# Patient Record
Sex: Female | Born: 1955
Health system: Southern US, Community
[De-identification: ages and names within clinical notes are randomized; demographics above are authoritative.]

## PROBLEM LIST (undated history)

## (undated) DIAGNOSIS — N02B9 Other recurrent and persistent immunoglobulin A nephropathy: Secondary | ICD-10-CM

## (undated) DIAGNOSIS — B029 Zoster without complications: Secondary | ICD-10-CM

## (undated) DIAGNOSIS — T8859XA Other complications of anesthesia, initial encounter: Secondary | ICD-10-CM

## (undated) DIAGNOSIS — R011 Cardiac murmur, unspecified: Secondary | ICD-10-CM

## (undated) DIAGNOSIS — Z8719 Personal history of other diseases of the digestive system: Secondary | ICD-10-CM

## (undated) DIAGNOSIS — T8612 Kidney transplant failure: Secondary | ICD-10-CM

## (undated) DIAGNOSIS — J189 Pneumonia, unspecified organism: Secondary | ICD-10-CM

## (undated) DIAGNOSIS — Z9289 Personal history of other medical treatment: Secondary | ICD-10-CM

## (undated) DIAGNOSIS — D899 Disorder involving the immune mechanism, unspecified: Secondary | ICD-10-CM

## (undated) DIAGNOSIS — Z87898 Personal history of other specified conditions: Secondary | ICD-10-CM

## (undated) DIAGNOSIS — K5790 Diverticulosis of intestine, part unspecified, without perforation or abscess without bleeding: Secondary | ICD-10-CM

## (undated) DIAGNOSIS — T8611 Kidney transplant rejection: Secondary | ICD-10-CM

## (undated) DIAGNOSIS — U071 COVID-19: Secondary | ICD-10-CM

## (undated) DIAGNOSIS — I10 Essential (primary) hypertension: Secondary | ICD-10-CM

## (undated) DIAGNOSIS — Z8709 Personal history of other diseases of the respiratory system: Secondary | ICD-10-CM

## (undated) DIAGNOSIS — I639 Cerebral infarction, unspecified: Secondary | ICD-10-CM

## (undated) DIAGNOSIS — I491 Atrial premature depolarization: Secondary | ICD-10-CM

## (undated) DIAGNOSIS — I440 Atrioventricular block, first degree: Secondary | ICD-10-CM

## (undated) DIAGNOSIS — T4145XA Adverse effect of unspecified anesthetic, initial encounter: Secondary | ICD-10-CM

## (undated) DIAGNOSIS — I493 Ventricular premature depolarization: Secondary | ICD-10-CM

## (undated) DIAGNOSIS — K219 Gastro-esophageal reflux disease without esophagitis: Secondary | ICD-10-CM

## (undated) DIAGNOSIS — E039 Hypothyroidism, unspecified: Secondary | ICD-10-CM

## (undated) DIAGNOSIS — M1990 Unspecified osteoarthritis, unspecified site: Secondary | ICD-10-CM

## (undated) DIAGNOSIS — Z992 Dependence on renal dialysis: Secondary | ICD-10-CM

## (undated) DIAGNOSIS — Z940 Kidney transplant status: Secondary | ICD-10-CM

## (undated) DIAGNOSIS — G4730 Sleep apnea, unspecified: Secondary | ICD-10-CM

## (undated) DIAGNOSIS — J45909 Unspecified asthma, uncomplicated: Secondary | ICD-10-CM

## (undated) DIAGNOSIS — N028 Recurrent and persistent hematuria with other morphologic changes: Secondary | ICD-10-CM

## (undated) DIAGNOSIS — D649 Anemia, unspecified: Secondary | ICD-10-CM

## (undated) DIAGNOSIS — R0600 Dyspnea, unspecified: Secondary | ICD-10-CM

## (undated) HISTORY — DX: Diverticulosis of intestine, part unspecified, without perforation or abscess without bleeding: K57.90

## (undated) HISTORY — DX: Disorder involving the immune mechanism, unspecified: D89.9

## (undated) HISTORY — PX: COLONOSCOPY: SHX174

## (undated) HISTORY — DX: Atrioventricular block, first degree: I44.0

## (undated) HISTORY — PX: EYE SURGERY: SHX253

## (undated) HISTORY — DX: Dependence on renal dialysis: Z99.2

## (undated) HISTORY — PX: ESOPHAGOGASTRODUODENOSCOPY: SHX1529

## (undated) HISTORY — DX: Atrial premature depolarization: I49.1

## (undated) HISTORY — DX: Unspecified asthma, uncomplicated: J45.909

## (undated) HISTORY — DX: Zoster without complications: B02.9

## (undated) HISTORY — DX: Ventricular premature depolarization: I49.3

---

## 2001-08-17 ENCOUNTER — Other Ambulatory Visit: Admission: RE | Admit: 2001-08-17 | Discharge: 2001-08-17 | Payer: Self-pay | Admitting: Family Medicine

## 2002-06-21 ENCOUNTER — Encounter: Admission: RE | Admit: 2002-06-21 | Discharge: 2002-09-19 | Payer: Self-pay | Admitting: Nephrology

## 2002-07-09 ENCOUNTER — Encounter (HOSPITAL_COMMUNITY): Admission: RE | Admit: 2002-07-09 | Discharge: 2002-10-07 | Payer: Self-pay | Admitting: Nephrology

## 2002-10-13 ENCOUNTER — Encounter (HOSPITAL_COMMUNITY): Admission: RE | Admit: 2002-10-13 | Discharge: 2003-01-11 | Payer: Self-pay | Admitting: Nephrology

## 2003-01-18 ENCOUNTER — Encounter (HOSPITAL_COMMUNITY): Admission: RE | Admit: 2003-01-18 | Discharge: 2003-04-18 | Payer: Self-pay | Admitting: Nephrology

## 2003-02-11 ENCOUNTER — Ambulatory Visit (HOSPITAL_COMMUNITY): Admission: RE | Admit: 2003-02-11 | Discharge: 2003-02-11 | Payer: Self-pay | Admitting: Family Medicine

## 2003-02-11 ENCOUNTER — Encounter: Payer: Self-pay | Admitting: Family Medicine

## 2003-04-20 ENCOUNTER — Encounter (HOSPITAL_COMMUNITY): Admission: RE | Admit: 2003-04-20 | Discharge: 2003-07-19 | Payer: Self-pay | Admitting: Nephrology

## 2003-07-26 ENCOUNTER — Encounter (HOSPITAL_COMMUNITY): Admission: RE | Admit: 2003-07-26 | Discharge: 2003-10-24 | Payer: Self-pay | Admitting: Nephrology

## 2003-10-26 ENCOUNTER — Encounter (HOSPITAL_COMMUNITY): Admission: RE | Admit: 2003-10-26 | Discharge: 2004-01-24 | Payer: Self-pay | Admitting: Nephrology

## 2004-02-02 ENCOUNTER — Other Ambulatory Visit: Admission: RE | Admit: 2004-02-02 | Discharge: 2004-02-02 | Payer: Self-pay | Admitting: Family Medicine

## 2004-02-09 ENCOUNTER — Encounter (HOSPITAL_COMMUNITY): Admission: RE | Admit: 2004-02-09 | Discharge: 2004-05-09 | Payer: Self-pay | Admitting: Nephrology

## 2004-05-25 ENCOUNTER — Encounter (HOSPITAL_COMMUNITY): Admission: RE | Admit: 2004-05-25 | Discharge: 2004-08-23 | Payer: Self-pay | Admitting: Nephrology

## 2004-07-02 ENCOUNTER — Encounter: Admission: RE | Admit: 2004-07-02 | Discharge: 2004-07-02 | Payer: Self-pay | Admitting: Internal Medicine

## 2004-09-13 ENCOUNTER — Encounter (HOSPITAL_COMMUNITY): Admission: RE | Admit: 2004-09-13 | Discharge: 2004-12-12 | Payer: Self-pay | Admitting: Nephrology

## 2004-12-27 ENCOUNTER — Encounter (HOSPITAL_COMMUNITY): Admission: RE | Admit: 2004-12-27 | Discharge: 2005-03-27 | Payer: Self-pay | Admitting: Nephrology

## 2005-04-10 ENCOUNTER — Encounter (HOSPITAL_COMMUNITY): Admission: RE | Admit: 2005-04-10 | Discharge: 2005-07-09 | Payer: Self-pay | Admitting: Nephrology

## 2005-06-22 ENCOUNTER — Ambulatory Visit (HOSPITAL_BASED_OUTPATIENT_CLINIC_OR_DEPARTMENT_OTHER): Admission: RE | Admit: 2005-06-22 | Discharge: 2005-06-22 | Payer: Self-pay | Admitting: Otolaryngology

## 2005-06-30 ENCOUNTER — Ambulatory Visit: Payer: Self-pay | Admitting: Internal Medicine

## 2005-08-02 ENCOUNTER — Encounter (HOSPITAL_COMMUNITY): Admission: RE | Admit: 2005-08-02 | Discharge: 2005-10-31 | Payer: Self-pay | Admitting: Otolaryngology

## 2005-12-05 ENCOUNTER — Encounter (HOSPITAL_COMMUNITY): Admission: RE | Admit: 2005-12-05 | Discharge: 2006-03-05 | Payer: Self-pay | Admitting: Nephrology

## 2006-03-27 ENCOUNTER — Encounter (HOSPITAL_COMMUNITY): Admission: RE | Admit: 2006-03-27 | Discharge: 2006-06-06 | Payer: Self-pay | Admitting: Nephrology

## 2006-07-09 ENCOUNTER — Encounter (HOSPITAL_COMMUNITY): Admission: RE | Admit: 2006-07-09 | Discharge: 2006-10-07 | Payer: Self-pay | Admitting: Nephrology

## 2006-10-29 ENCOUNTER — Encounter (HOSPITAL_COMMUNITY): Admission: RE | Admit: 2006-10-29 | Discharge: 2007-01-27 | Payer: Self-pay | Admitting: Nephrology

## 2007-02-18 ENCOUNTER — Encounter (HOSPITAL_COMMUNITY): Admission: RE | Admit: 2007-02-18 | Discharge: 2007-05-19 | Payer: Self-pay | Admitting: Nephrology

## 2007-06-03 ENCOUNTER — Encounter (HOSPITAL_COMMUNITY): Admission: RE | Admit: 2007-06-03 | Discharge: 2007-09-01 | Payer: Self-pay | Admitting: Nephrology

## 2007-08-17 ENCOUNTER — Ambulatory Visit: Payer: Self-pay | Admitting: Internal Medicine

## 2007-09-16 ENCOUNTER — Encounter (HOSPITAL_COMMUNITY): Admission: RE | Admit: 2007-09-16 | Discharge: 2007-12-15 | Payer: Self-pay | Admitting: Nephrology

## 2007-10-12 DIAGNOSIS — J039 Acute tonsillitis, unspecified: Secondary | ICD-10-CM | POA: Insufficient documentation

## 2007-10-12 DIAGNOSIS — J309 Allergic rhinitis, unspecified: Secondary | ICD-10-CM | POA: Insufficient documentation

## 2007-10-12 DIAGNOSIS — E039 Hypothyroidism, unspecified: Secondary | ICD-10-CM | POA: Insufficient documentation

## 2007-10-12 DIAGNOSIS — I10 Essential (primary) hypertension: Secondary | ICD-10-CM | POA: Insufficient documentation

## 2007-10-12 DIAGNOSIS — D649 Anemia, unspecified: Secondary | ICD-10-CM | POA: Insufficient documentation

## 2007-10-12 DIAGNOSIS — I499 Cardiac arrhythmia, unspecified: Secondary | ICD-10-CM | POA: Insufficient documentation

## 2007-10-12 DIAGNOSIS — J45909 Unspecified asthma, uncomplicated: Secondary | ICD-10-CM | POA: Insufficient documentation

## 2007-10-12 DIAGNOSIS — Z992 Dependence on renal dialysis: Secondary | ICD-10-CM

## 2007-10-12 DIAGNOSIS — N186 End stage renal disease: Secondary | ICD-10-CM | POA: Insufficient documentation

## 2007-11-09 ENCOUNTER — Ambulatory Visit: Payer: Self-pay | Admitting: Internal Medicine

## 2007-11-09 DIAGNOSIS — J45991 Cough variant asthma: Secondary | ICD-10-CM | POA: Insufficient documentation

## 2007-11-12 ENCOUNTER — Ambulatory Visit (HOSPITAL_COMMUNITY): Admission: RE | Admit: 2007-11-12 | Discharge: 2007-11-12 | Payer: Self-pay | Admitting: Internal Medicine

## 2007-12-07 ENCOUNTER — Ambulatory Visit: Payer: Self-pay | Admitting: Internal Medicine

## 2007-12-07 DIAGNOSIS — K219 Gastro-esophageal reflux disease without esophagitis: Secondary | ICD-10-CM | POA: Insufficient documentation

## 2007-12-24 ENCOUNTER — Ambulatory Visit: Payer: Self-pay | Admitting: Gastroenterology

## 2007-12-30 ENCOUNTER — Encounter (HOSPITAL_COMMUNITY): Admission: RE | Admit: 2007-12-30 | Discharge: 2008-03-29 | Payer: Self-pay | Admitting: Nephrology

## 2008-01-11 ENCOUNTER — Ambulatory Visit: Payer: Self-pay | Admitting: Gastroenterology

## 2008-01-11 ENCOUNTER — Encounter: Payer: Self-pay | Admitting: Gastroenterology

## 2008-04-13 ENCOUNTER — Encounter (HOSPITAL_COMMUNITY): Admission: RE | Admit: 2008-04-13 | Discharge: 2008-07-12 | Payer: Self-pay | Admitting: Nephrology

## 2008-05-06 ENCOUNTER — Encounter: Payer: Self-pay | Admitting: Internal Medicine

## 2008-07-27 ENCOUNTER — Encounter (HOSPITAL_COMMUNITY): Admission: RE | Admit: 2008-07-27 | Discharge: 2008-09-22 | Payer: Self-pay | Admitting: Nephrology

## 2008-08-25 ENCOUNTER — Ambulatory Visit (HOSPITAL_COMMUNITY): Admission: RE | Admit: 2008-08-25 | Discharge: 2008-08-25 | Payer: Self-pay | Admitting: General Surgery

## 2008-08-25 HISTORY — PX: CAPD INSERTION: SHX5233

## 2008-10-14 ENCOUNTER — Inpatient Hospital Stay (HOSPITAL_COMMUNITY): Admission: EM | Admit: 2008-10-14 | Discharge: 2008-10-15 | Payer: Self-pay | Admitting: Emergency Medicine

## 2008-10-14 ENCOUNTER — Ambulatory Visit: Payer: Self-pay | Admitting: Cardiology

## 2008-10-15 ENCOUNTER — Encounter: Payer: Self-pay | Admitting: Cardiology

## 2008-11-10 ENCOUNTER — Ambulatory Visit: Payer: Self-pay | Admitting: Cardiology

## 2008-11-17 ENCOUNTER — Encounter: Payer: Self-pay | Admitting: Cardiology

## 2008-11-17 ENCOUNTER — Ambulatory Visit: Payer: Self-pay

## 2008-11-21 ENCOUNTER — Encounter: Admission: RE | Admit: 2008-11-21 | Discharge: 2008-11-21 | Payer: Self-pay | Admitting: Nephrology

## 2009-03-30 ENCOUNTER — Other Ambulatory Visit: Admission: RE | Admit: 2009-03-30 | Discharge: 2009-03-30 | Payer: Self-pay | Admitting: Family Medicine

## 2009-07-08 ENCOUNTER — Inpatient Hospital Stay (HOSPITAL_COMMUNITY): Admission: EM | Admit: 2009-07-08 | Discharge: 2009-07-10 | Payer: Self-pay | Admitting: Emergency Medicine

## 2009-07-08 ENCOUNTER — Ambulatory Visit: Payer: Self-pay | Admitting: Internal Medicine

## 2009-07-10 ENCOUNTER — Encounter (INDEPENDENT_AMBULATORY_CARE_PROVIDER_SITE_OTHER): Payer: Self-pay | Admitting: Internal Medicine

## 2009-10-25 ENCOUNTER — Ambulatory Visit (HOSPITAL_COMMUNITY): Admission: RE | Admit: 2009-10-25 | Discharge: 2009-10-25 | Payer: Self-pay | Admitting: General Surgery

## 2009-10-25 HISTORY — PX: UMBILICAL HERNIA REPAIR: SHX196

## 2009-12-09 ENCOUNTER — Encounter: Admission: RE | Admit: 2009-12-09 | Discharge: 2009-12-09 | Payer: Self-pay | Admitting: Nephrology

## 2010-01-21 HISTORY — PX: KIDNEY TRANSPLANT: SHX239

## 2010-03-22 ENCOUNTER — Ambulatory Visit (HOSPITAL_COMMUNITY): Admission: RE | Admit: 2010-03-22 | Discharge: 2010-03-22 | Payer: Self-pay | Admitting: General Surgery

## 2010-03-22 HISTORY — PX: CAPD REMOVAL: SHX5234

## 2010-09-23 DIAGNOSIS — T8611 Kidney transplant rejection: Secondary | ICD-10-CM

## 2010-09-23 HISTORY — DX: Kidney transplant rejection: T86.11

## 2010-09-24 LAB — HM COLONOSCOPY: HM COLON: NORMAL

## 2010-12-09 LAB — DIFFERENTIAL
Basophils Absolute: 0 10*3/uL (ref 0.0–0.1)
Basophils Absolute: 0 10*3/uL (ref 0.0–0.1)
Basophils Relative: 0 % (ref 0–1)
Basophils Relative: 0 % (ref 0–1)
Eosinophils Absolute: 0.1 10*3/uL (ref 0.0–0.7)
Eosinophils Absolute: 0 10*3/uL (ref 0.0–0.7)
Eosinophils Relative: 3 % (ref 0–5)
Eosinophils Relative: 0 % (ref 0–5)
Lymphocytes Relative: 22 % (ref 12–46)
Lymphocytes Relative: 13 % (ref 12–46)
Lymphs Abs: 1.2 10*3/uL (ref 0.7–4.0)
Lymphs Abs: 1.2 10*3/uL (ref 0.7–4.0)
Monocytes Absolute: 0.4 10*3/uL (ref 0.1–1.0)
Monocytes Absolute: 0.4 10*3/uL (ref 0.1–1.0)
Monocytes Relative: 7 % (ref 3–12)
Monocytes Relative: 5 % (ref 3–12)
Neutro Abs: 3.6 10*3/uL (ref 1.7–7.7)
Neutro Abs: 7.3 10*3/uL (ref 1.7–7.7)
Neutrophils Relative %: 68 % (ref 43–77)
Neutrophils Relative %: 82 % — ABNORMAL HIGH (ref 43–77)

## 2010-12-09 LAB — CBC
HCT: 30.1 % — ABNORMAL LOW (ref 36.0–46.0)
HCT: 34.9 % — ABNORMAL LOW (ref 36.0–46.0)
Hemoglobin: 10.2 g/dL — ABNORMAL LOW (ref 12.0–15.0)
Hemoglobin: 11.5 g/dL — ABNORMAL LOW (ref 12.0–15.0)
MCH: 34.3 pg — ABNORMAL HIGH (ref 26.0–34.0)
MCHC: 33.9 g/dL (ref 30.0–36.0)
MCHC: 32.9 g/dL (ref 30.0–36.0)
MCV: 99.8 fL (ref 78.0–100.0)
MCV: 104.3 fL — ABNORMAL HIGH (ref 78.0–100.0)
Platelets: 195 10*3/uL (ref 150–400)
Platelets: 177 10*3/uL (ref 150–400)
RBC: 3.02 MIL/uL — ABNORMAL LOW (ref 3.87–5.11)
RBC: 3.35 MIL/uL — ABNORMAL LOW (ref 3.87–5.11)
RDW: 15.5 % (ref 11.5–15.5)
RDW: 15.4 % (ref 11.5–15.5)
WBC: 5.2 10*3/uL (ref 4.0–10.5)
WBC: 8.9 10*3/uL (ref 4.0–10.5)

## 2010-12-09 LAB — BASIC METABOLIC PANEL
BUN: 55 mg/dL — ABNORMAL HIGH (ref 6–23)
BUN: 20 mg/dL (ref 6–23)
CO2: 31 mEq/L (ref 19–32)
CO2: 27 mEq/L (ref 19–32)
Calcium: 10 mg/dL (ref 8.4–10.5)
Calcium: 10.3 mg/dL (ref 8.4–10.5)
Chloride: 95 mEq/L — ABNORMAL LOW (ref 96–112)
Chloride: 109 mEq/L (ref 96–112)
Creatinine, Ser: 12.82 mg/dL — ABNORMAL HIGH (ref 0.4–1.2)
Creatinine, Ser: 1.18 mg/dL (ref 0.4–1.2)
GFR calc Af Amer: 4 mL/min — ABNORMAL LOW (ref >60)
GFR calc Af Amer: 58 mL/min — ABNORMAL LOW (ref >60)
GFR calc non Af Amer: 3 mL/min — ABNORMAL LOW (ref >60)
GFR calc non Af Amer: 48 mL/min — ABNORMAL LOW (ref >60)
Glucose, Bld: 116 mg/dL — ABNORMAL HIGH (ref 70–99)
Glucose, Bld: 129 mg/dL — ABNORMAL HIGH (ref 70–99)
Potassium: 3.7 mEq/L (ref 3.5–5.1)
Potassium: 5 mEq/L (ref 3.5–5.1)
Sodium: 138 mEq/L (ref 135–145)
Sodium: 139 mEq/L (ref 135–145)

## 2010-12-09 LAB — SURGICAL PCR SCREEN
MRSA, PCR: NEGATIVE
Staphylococcus aureus: NEGATIVE

## 2010-12-12 LAB — POCT I-STAT 4, (NA,K, GLUC, HGB,HCT)
Glucose, Bld: 104 mg/dL — ABNORMAL HIGH (ref 70–99)
HCT: 26 % — ABNORMAL LOW (ref 36.0–46.0)
Hemoglobin: 8.8 g/dL — ABNORMAL LOW (ref 12.0–15.0)
Potassium: 3.4 mEq/L — ABNORMAL LOW (ref 3.5–5.1)
Sodium: 138 mEq/L (ref 135–145)

## 2010-12-27 LAB — COMPREHENSIVE METABOLIC PANEL
Alkaline Phosphatase: 94 U/L (ref 39–117)
BUN: 57 mg/dL — ABNORMAL HIGH (ref 6–23)
CO2: 27 mEq/L (ref 19–32)
GFR calc non Af Amer: 3 mL/min — ABNORMAL LOW (ref >60)
Glucose, Bld: 113 mg/dL — ABNORMAL HIGH (ref 70–99)
Potassium: 3.5 mEq/L (ref 3.5–5.1)
Total Protein: 6.7 g/dL (ref 6.0–8.3)

## 2010-12-27 LAB — CBC
HCT: 42.9 % (ref 36.0–46.0)
Hemoglobin: 12.8 g/dL (ref 12.0–15.0)
Hemoglobin: 14.6 g/dL (ref 12.0–15.0)
MCHC: 34.1 g/dL (ref 30.0–36.0)
MCHC: 34.7 g/dL (ref 30.0–36.0)
MCV: 98.2 fL (ref 78.0–100.0)
Platelets: 235 10*3/uL (ref 150–400)
RBC: 4.36 MIL/uL (ref 3.87–5.11)
RDW: 14 % (ref 11.5–15.5)
RDW: 14.1 % (ref 11.5–15.5)
WBC: 6.3 10*3/uL (ref 4.0–10.5)

## 2010-12-27 LAB — URINALYSIS, ROUTINE W REFLEX MICROSCOPIC
Glucose, UA: NEGATIVE mg/dL
Ketones, ur: NEGATIVE mg/dL
Protein, ur: 100 mg/dL — AB
Urobilinogen, UA: 0.2 mg/dL (ref 0.0–1.0)

## 2010-12-27 LAB — CARDIAC PANEL(CRET KIN+CKTOT+MB+TROPI)
CK, MB: 2.9 ng/mL (ref 0.3–4.0)
CK, MB: 3.1 ng/mL (ref 0.3–4.0)
Relative Index: 2.1 (ref 0.0–2.5)
Total CK: 135 U/L (ref 7–177)
Total CK: 162 U/L (ref 7–177)
Troponin I: 0.03 ng/mL (ref 0.00–0.06)

## 2010-12-27 LAB — HEMOGLOBIN A1C
Hgb A1c MFr Bld: 5.4 % (ref 4.6–6.1)
Mean Plasma Glucose: 108 mg/dL

## 2010-12-27 LAB — RENAL FUNCTION PANEL
Albumin: 2.7 g/dL — ABNORMAL LOW (ref 3.5–5.2)
Calcium: 9.9 mg/dL (ref 8.4–10.5)
GFR calc Af Amer: 4 mL/min — ABNORMAL LOW (ref >60)
GFR calc non Af Amer: 3 mL/min — ABNORMAL LOW (ref >60)
Phosphorus: 4.7 mg/dL — ABNORMAL HIGH (ref 2.3–4.6)
Potassium: 3.2 mEq/L — ABNORMAL LOW (ref 3.5–5.1)
Sodium: 132 mEq/L — ABNORMAL LOW (ref 135–145)

## 2010-12-27 LAB — URINE MICROSCOPIC-ADD ON

## 2010-12-27 LAB — DIFFERENTIAL
Basophils Absolute: 0 10*3/uL (ref 0.0–0.1)
Basophils Relative: 0 % (ref 0–1)
Monocytes Relative: 8 % (ref 3–12)
Neutro Abs: 4.4 10*3/uL (ref 1.7–7.7)
Neutrophils Relative %: 70 % (ref 43–77)

## 2010-12-27 LAB — PROTIME-INR
INR: 1.02 (ref 0.00–1.49)
Prothrombin Time: 13.3 seconds (ref 11.6–15.2)

## 2010-12-27 LAB — POCT I-STAT, CHEM 8
Calcium, Ion: 1.23 mmol/L (ref 1.12–1.32)
Creatinine, Ser: 11.8 mg/dL — ABNORMAL HIGH (ref 0.4–1.2)
Glucose, Bld: 109 mg/dL — ABNORMAL HIGH (ref 70–99)
HCT: 43 % (ref 36.0–46.0)
Hemoglobin: 14.6 g/dL (ref 12.0–15.0)
Potassium: 3 mEq/L — ABNORMAL LOW (ref 3.5–5.1)
TCO2: 29 mmol/L (ref 0–100)

## 2010-12-27 LAB — CK TOTAL AND CKMB (NOT AT ARMC)
CK, MB: 3.5 ng/mL (ref 0.3–4.0)
CK, MB: 3.7 ng/mL (ref 0.3–4.0)
Relative Index: 2.3 (ref 0.0–2.5)
Total CK: 155 U/L (ref 7–177)
Total CK: 185 U/L — ABNORMAL HIGH (ref 7–177)

## 2010-12-27 LAB — LIPID PANEL
Cholesterol: 143 mg/dL (ref 0–200)
HDL: 45 mg/dL (ref >39)
LDL Cholesterol: 93 mg/dL (ref 0–99)
Total CHOL/HDL Ratio: 3.2 RATIO
Triglycerides: 23 mg/dL (ref <150)
VLDL: 5 mg/dL (ref 0–40)

## 2010-12-27 LAB — HOMOCYSTEINE: Homocysteine: 16.9 umol/L — ABNORMAL HIGH (ref 4.0–15.4)

## 2010-12-27 LAB — APTT: aPTT: 26 seconds (ref 24–37)

## 2011-01-03 ENCOUNTER — Emergency Department (HOSPITAL_COMMUNITY)
Admission: EM | Admit: 2011-01-03 | Discharge: 2011-01-04 | Disposition: A | Discharge status: Nursing Home (Includes ICF) | Payer: BC Managed Care – PPO | Attending: Emergency Medicine | Admitting: Emergency Medicine

## 2011-01-03 DIAGNOSIS — R300 Dysuria: Secondary | ICD-10-CM | POA: Insufficient documentation

## 2011-01-03 DIAGNOSIS — N390 Urinary tract infection, site not specified: Secondary | ICD-10-CM | POA: Insufficient documentation

## 2011-01-03 DIAGNOSIS — I10 Essential (primary) hypertension: Secondary | ICD-10-CM | POA: Insufficient documentation

## 2011-01-03 DIAGNOSIS — E039 Hypothyroidism, unspecified: Secondary | ICD-10-CM | POA: Insufficient documentation

## 2011-01-03 DIAGNOSIS — Z940 Kidney transplant status: Secondary | ICD-10-CM | POA: Insufficient documentation

## 2011-01-03 DIAGNOSIS — N289 Disorder of kidney and ureter, unspecified: Secondary | ICD-10-CM | POA: Insufficient documentation

## 2011-01-03 DIAGNOSIS — K219 Gastro-esophageal reflux disease without esophagitis: Secondary | ICD-10-CM | POA: Insufficient documentation

## 2011-01-03 DIAGNOSIS — R3915 Urgency of urination: Secondary | ICD-10-CM | POA: Insufficient documentation

## 2011-01-03 DIAGNOSIS — R319 Hematuria, unspecified: Secondary | ICD-10-CM | POA: Insufficient documentation

## 2011-01-03 DIAGNOSIS — R350 Frequency of micturition: Secondary | ICD-10-CM | POA: Insufficient documentation

## 2011-01-04 LAB — URINALYSIS, ROUTINE W REFLEX MICROSCOPIC
Ketones, ur: 15 mg/dL — AB
Nitrite: POSITIVE — AB
Protein, ur: >300 mg/dL — AB
Urobilinogen, UA: 0.2 mg/dL (ref 0.0–1.0)

## 2011-01-04 LAB — CBC
MCH: 28.9 pg (ref 26.0–34.0)
MCHC: 32.4 g/dL (ref 30.0–36.0)
Platelets: 192 10*3/uL (ref 150–400)
RBC: 4.71 MIL/uL (ref 3.87–5.11)
RDW: 12.1 % (ref 11.5–15.5)

## 2011-01-04 LAB — BASIC METABOLIC PANEL
BUN: 41 mg/dL — ABNORMAL HIGH (ref 6–23)
Calcium: 10.2 mg/dL (ref 8.4–10.5)
Creatinine, Ser: 3.1 mg/dL — ABNORMAL HIGH (ref 0.4–1.2)
GFR calc Af Amer: 19 mL/min — ABNORMAL LOW (ref >60)
GFR calc non Af Amer: 16 mL/min — ABNORMAL LOW (ref >60)

## 2011-01-04 LAB — DIFFERENTIAL
Eosinophils Absolute: 0.1 10*3/uL (ref 0.0–0.7)
Eosinophils Relative: 1 % (ref 0–5)
Lymphocytes Relative: 15 % (ref 12–46)
Lymphs Abs: 1.4 10*3/uL (ref 0.7–4.0)
Monocytes Absolute: 1 10*3/uL (ref 0.1–1.0)
Monocytes Relative: 11 % (ref 3–12)

## 2011-01-04 LAB — URINE MICROSCOPIC-ADD ON

## 2011-01-05 LAB — URINE CULTURE

## 2011-01-07 LAB — CBC
HCT: 40.1 % (ref 36.0–46.0)
Hemoglobin: 12.6 g/dL (ref 12.0–15.0)
MCV: 85.5 fL (ref 78.0–100.0)
MCV: 87.2 fL (ref 78.0–100.0)
Platelets: 341 10*3/uL (ref 150–400)
Platelets: 366 10*3/uL (ref 150–400)
RBC: 4.03 MIL/uL (ref 3.87–5.11)
RDW: 15.9 % — ABNORMAL HIGH (ref 11.5–15.5)
WBC: 5.3 10*3/uL (ref 4.0–10.5)

## 2011-01-07 LAB — BASIC METABOLIC PANEL
BUN: 55 mg/dL — ABNORMAL HIGH (ref 6–23)
CO2: 22 mEq/L (ref 19–32)
Chloride: 94 mEq/L — ABNORMAL LOW (ref 96–112)
Chloride: 94 mEq/L — ABNORMAL LOW (ref 96–112)
GFR calc Af Amer: 2 mL/min — ABNORMAL LOW (ref >60)
Glucose, Bld: 92 mg/dL (ref 70–99)
Potassium: 3.3 mEq/L — ABNORMAL LOW (ref 3.5–5.1)
Potassium: 3.5 mEq/L (ref 3.5–5.1)
Sodium: 135 mEq/L (ref 135–145)
Sodium: 137 mEq/L (ref 135–145)

## 2011-01-07 LAB — POCT CARDIAC MARKERS
CKMB, poc: 2.6 ng/mL (ref 1.0–8.0)
Myoglobin, poc: >500 ng/mL (ref 12–200)
Troponin i, poc: <0.05 ng/mL (ref 0.00–0.09)

## 2011-01-07 LAB — DIFFERENTIAL
Basophils Absolute: 0 10*3/uL (ref 0.0–0.1)
Eosinophils Absolute: 0.1 10*3/uL (ref 0.0–0.7)
Eosinophils Relative: 2 % (ref 0–5)
Lymphocytes Relative: 11 % — ABNORMAL LOW (ref 12–46)
Lymphs Abs: 0.7 10*3/uL (ref 0.7–4.0)
Monocytes Absolute: 0.8 10*3/uL (ref 0.1–1.0)

## 2011-01-07 LAB — CARDIAC PANEL(CRET KIN+CKTOT+MB+TROPI)
CK, MB: 2 ng/mL (ref 0.3–4.0)
Relative Index: INVALID (ref 0.0–2.5)
Relative Index: INVALID (ref 0.0–2.5)
Troponin I: <0.01 ng/mL (ref 0.00–0.06)

## 2011-01-07 LAB — D-DIMER, QUANTITATIVE: D-Dimer, Quant: 0.26 ug/mL-FEU (ref 0.00–0.48)

## 2011-01-07 LAB — CK TOTAL AND CKMB (NOT AT ARMC): Relative Index: INVALID (ref 0.0–2.5)

## 2011-01-07 LAB — TROPONIN I: Troponin I: 0.02 ng/mL (ref 0.00–0.06)

## 2011-02-05 NOTE — Assessment & Plan Note (Signed)
[facility] OFFICE NOTE   NAME:Single, JACKELINE HEINLE                     MRN:          [id number]  DATE:11/10/2008                            DOB:          12-Dec-1955    Ms. Maertens is here today for the follow-up of her cardiac status.  The  patient is on chronic home peritoneal dialysis.  I had seen her during  an admission to [facility] on October 14, 2008 with a discharge  date of October 15, 2008.  When the patient came in she had chest  discomfort.  She had some inferolateral J-point elevation.  We wanted to  be sure that there was no evidence of an MI and that there was no  significant evidence of a pericarditis.  She was admitted.  Cardiac  enzymes revealed no evidence of an MI.  I consulted Dr. Kelly Splinter of  nephrology for his opinion concerning whether she could have uremic  pericarditis.  Her creatinine was significantly elevated.  However, I  understand that creatinines remain elevated with peritoneal dialysis.  She stabilized and was ready to go home the next day.  Since that time  she has been stable.  She did feel some palpitations and some mild  shortness of breath 1 day.  Otherwise she has been stable.  Her dialysis  volume has been increased.  She is to see Dr. Corliss Parish in the  very near future for her nephrology follow-up.  In the hospital she was  stable and we did not proceed with a 2D echo.  This will be planned for  the near future.   Today she is here with her husband.  She is stable.   PAST MEDICAL HISTORY:   ALLERGIES:  SULFA.   MEDICATIONS:  1. Synthroid 112 mcg  2. Epogen.  3. Spiriva.  4. Symbicort.   OTHER MEDICAL PROBLEMS:  See the complete list below.   REVIEW OF SYSTEMS:  She has no fever or chills.  There are no skin  rashes.  She is not having any GI or GU symptoms.  Her review of systems  otherwise negative.   PHYSICAL EXAMINATION:  Blood pressure  today is 88/60 with a pulse of 77.  Weight is 134 pounds.  The patient is oriented to person, time and  place.  Affect is normal.  She is here with her husband.  HEENT:  Reveals no xanthelasma.  She has normal extraocular motion.  There are no carotid bruits.  There is no jugular venous distention.  LUNGS:  Clear.  Respiratory effort is not labored.  CARDIAC:  Exam reveals S1 with an S2.  There are no clicks or  significant murmurs.  ABDOMEN:  Soft.  She has her chronic dialysis catheter in place.  There is no significant peripheral edema.   EKG reveals sinus rhythm with an atrial abnormality.  There is mild J-  point elevation in the inferior leads.  She has diffuse nonspecific ST-T  wave changes.   PROBLEMS:  1. Chronic kidney disease on long-term peritoneal dialysis at home.  2. History of chronic cough that may be due to gastroesophageal reflux      disease.  3. History of hypertension, although she is relatively hypotensive at      this time.  4. History of asthma.  5. Hypothyroidism, treated.  6. IgA nephropathy.  Recent creatinine in the hospital was in the      range of 18.  7. Mild inferior J-point elevation.  8. Recent hospitalization with chest discomfort.  This is resolved.      We do not have any definite proof of either pericarditis or      ischemic heart disease.  9. Abnormal EKG.  We will proceed with an outpatient 2D      echocardiogram.  We need this information to assess left      ventricular function and left atrial size and function and rule out      occult valvular disease.  I will see her back for follow-up to      discuss this.  10.History of SULFA allergy.  11.History of mild intermittent headaches.  12.Gastroesophageal reflux disease.     Carlena Bjornstad, MD, [facility]  Electronically Signed    JDK/MedQ  DD: 11/10/2008  DT: 11/10/2008  Job #: [id number]   cc:   Judithann Sauger, M.D.  Louis Meckel, M.D.

## 2011-02-05 NOTE — Op Note (Signed)
Kimberly, [name]              ACCOUNT NO.:  [id number]   MEDICAL RECORD NO.:  [id number]          PATIENT TYPE:  AMB   LOCATION:  SDS                          FACILITY:  [facility]   PHYSICIAN:  Orson Ape. Weatherly, M.D.DATE OF BIRTH:  1955/11/19   DATE OF PROCEDURE:  08/25/2008  DATE OF DISCHARGE:  08/25/2008                               OPERATIVE REPORT   PREOPERATIVE DIAGNOSIS:  End-stage renal disease secondary to  immunoglobulin G antibodies for placement of a continuous ambulatory  peritoneal dialysis catheter.   POSTOPERATIVE DIAGNOSIS:  End-stage renal disease secondary to  immunoglobulin G antibodies for placement of a continuous ambulatory  peritoneal dialysis catheter.   OPERATION:  Placement of a right [place] continuous ambulatory  peritoneal dialysis catheter.   LOA.   ANESTHESIA:  Local.   SURGEON:  Orson Ape. [name], MD   HISTORY:  Kimberly [name] is a 55 year old Caucasian female who has had a  known progressive deterioration of her kidney function.  She has got IgG  antibodies and was hopeful to get a kidney transplant from her sister in  the next week.  However, her sister has failed donor qualifications, and  she was seen in our office, I think Tuesday before Thanksgiving desiring  CAPD over hemodialysis.  Dr. Corliss Parish is her primary  physician, and recommended we go ahead and get a catheter in fairly  promptly.  She had hoped to wait until after the holidays.  She is here.  Her husband says that she is having problems with nausea and vomiting  and just feeling poorly, even though the patient kind of minimizes these  symptoms.  Preoperatively, her creatinine was 19.8, which is a  significant raise in the last 2 weeks.  Her potassium is satisfactory.  She had been receiving Procrit and Excedrin.  Her hemoglobin is 10.2,  her hematocrit is 29, and white count is 6100.  She was given 1 g of  Ancef, taken back to the OR suite.  She is very thin  and induction with  an LOA tube and then the abdomen was prepped with Betadine surgical  scrub solution and draped in a sterile manner.  A small area to the  right and below the umbilicus was picked and was infiltrated with  Marcaine with adrenaline.  A small incision made through the thin layer  of adipose tissue.  The anterior rectus was identified.  This was  anesthetized, small vertical opening  and the underlying rectus muscle  split in the direction of its fibers in the posterior rectus fascia.  Peritoneum picked up between 2 hemostats and placed 2-0 Vicryl  pursestring sutures, then placed the [place] right catheter in the  lower abdomen, tied the sutures so that the Silastic ball was within the  peritoneal cavity cuff is lined so that it reflects the catheter into  the pelvis.  The anterior rectus fascia was closed with interrupted  sutures of 2-0 Prolene.  The catheter tunneled to exit the right lower  quadrant, and it flushes easily.  It will go in by gravity and the fluid  returned clear.  She should be able to start peritoneal dialysis as soon  as the renal doctors feel it is necessary.  A subcutaneous tissue was  closed with 3-0 chromic, skin closed with 4-0 nylon, and had infiltrated  the tissues with Marcaine for postop minimal pain control.  The patient was sent to the recovery room and I have notified Dr. Justin Mend  and Dr. Moshe Cipro about her numbers, and they will make decision  whether the patient can be released with kind of prompt starting  peritoneal dialysis or whether will need to be made __________ or supine  peritoneal dialysis here in the hospital.           ______________________________  Orson Ape. [name], M.D.     WJW/MEDQ  D:  08/25/2008  T:  08/25/2008  Job:  [id number]   cc:   Louis Meckel, M.D.  [employer]

## 2011-02-05 NOTE — Assessment & Plan Note (Signed)
[facility]                             PULMONARY OFFICE NOTE   NAME:Jerrell, FABIAN ALMENDINGER                     MRN:          [id number]  DATE:08/17/2007                            DOB:          [birth year]    PULMONARY CONSULTATION   PROBLEM:  Pulmonary consultation at the kind request of Dr. Sharol Roussel for  this 55 year old woman complaining of chronic cough.   HISTORY:  She has had a bothersome cough at least 10 years.  She  remembers exposure to an unpleasant perfume around that time and I think  she assumes there may have been a connection.  Cough has gradually  seemed to get worse over the years.  In the morning she will bring up  trace yellow which them becomes clear later in the day with little  phlegm.  It rarely wakes her.  She cannot tell if there is any postnasal  drip.  Four years ago she had a 24-hour pH probe, by her description,  which was negative and she recognizes no heartburn and saw no benefit  taking Nexium.  She was given a diagnosis of asthma and at some point  had seen a pulmonologist in [place] but does not know who that was and  does not remember much about the interaction.  Allergy skin testing in  the past was positive for house dust and dust mite.  Drinking thin  liquid/water seems to make her cough some but she has no problem with  other liquids.  Her last chest x-ray was at [facility] about 2 years ago.   MEDICATION:  1. Synthroid 0.125 mg.  2. Calcitriol 25 mcg.  3. Chlorthalidone 25 mg.  4. Procrit every 5 weeks.  5. Chelorex two daily.  6. Vitamin C.   DRUG INTOLERANT SULFA.   REVIEW OF SYSTEMS:  Productive and nonproductive cough without shortness  of breath, irregular pulse/palpitation without chest pain, sore throats,  nasal congestion.  No fever, adenopathy, weight loss, rash or edema.   PAST HISTORY:  1. Recurrent tonsillitis in childhood.  2. No ENT surgery.  3. IgA nephropathy.  4. Renal failure.  Came close to  transplant but she stabilized and is      inactive on the transplant list with function improved, no      hemodialysis.  5. She has never had pneumonia.  6. Treated for hypertension.  7. Heart rhythm problems of uncertain type.  8. Asthma.  9. Allergic rhinitis.  10.Hypothyroid.  11.Anemia.  12.She had tendon surgery in her right hand.  13.No problems with Latex or aspirin.  Does not know about contrast      dye, but presumably that is not taken because of her kidney.   SOCIAL HISTORY:  1. Never smoked.  2. Occasional alcohol.  3. She is married with children.  4. Lives with husband.  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   ENVIRONMENTAL:  1. They live in house with crawl space heat pump.  2. No recognized mold or mildew problems.  3. No pets.  4. No smokers.  5. No significant feather exposure.  6. She does not recognize respiratory problems associated with home or      work place.   FAMILY HISTORY:  1. Mother died with emphysema.  She had had complaints of nasal      drainage and productive cough.  2. Mother and uncle with heart disease.  3. Mother with cancer.   OBJECTIVE:  Weight 158 pounds, BP 146/84, pulse 65, room air saturation  100%.  No evident distress.  Mild dry cough noted.  Skin without rash.  No adenopathy found.  HEENT:  Periorbital edema without conjunctival injection.  Nasal airway  mucosa looks normal.  A little mucus bridging.  No polyps.  CHEST:  Without rales, wheeze or rhonchi.  VOICE QUALITY:  Normal.  HEART SOUNDS:  Regular without murmur.  ABDOMEN:  Without enlargement of liver or spleen.  EXTREMITIES:  Without cyanosis, clubbing or edema.   She has a rather coarse upper airway type cough with question on hearing  it whether she may have some tracheomalacia.   VACCINE STATUS:  She declines flu and pneumococcal vaccines.   IMPRESSION:  Cough, possibly chronic tracheobronchitis with question of  tracheomalacia or bronchomalacia or both.  She has  had positive skin  testing in the past, raising the possibility that there may be some  atopic component.  I do not get a strong history to suggest either  postnasal drainage or reflux is contributory.   PLAN:  1. Chest x-ray.  2. PFT.  3. IgE RAST.  4. Schedule return for allergy testing as able off of antihistamines.  5. We will begin reassessment on return.   I appreciate the chance to meet her.  I do not know that there is a  connection between this and her history of IgA nephropathy, although  that needs to be kept in mind.     Clinton D. Annamaria Boots, MD, [facility] [place]  Electronically Signed    CDY/MedQ  DD: 08/22/2007  DT: 08/23/2007  Job #: [id number]   cc:   Coletta Memos, M.D.  Louis Meckel, M.D.

## 2011-02-05 NOTE — Consult Note (Signed)
Kimberly [name], Kimberly [name]              ACCOUNT NO.:  [id number]   MEDICAL RECORD NO.:  [id number]          PATIENT TYPE:  INP   LOCATION:  [id number]                         FACILITY:  [facility]   PHYSICIAN:  Sol Blazing, M.D.DATE OF BIRTH:  02/05/1956   DATE OF CONSULTATION:  10/15/2008  DATE OF DISCHARGE:  10/15/2008                                 CONSULTATION   REASON FOR CONSULT:  Evaluate for possible uremia.  The patient admitted  with chest pain, recently started dialysis.   HISTORY:  The patient is a 55 year old white female with a history of  hypertension and IgA nephropathy who recently started dialysis in  December 2009.  She has been doing peritoneal dialysis for the last four  and half weeks; first 2 weeks were for home training in late December  and the last two and half weeks in January.  She has been doing PD at  her home by herself.  The patient had uremic symptoms prior to starting  peritoneal dialysis with nausea, vomiting, severe anorexia, and severe  fatigue.  These symptoms according to the patient improved or resolved  completely within the first weeks of starting peritoneal dialysis.  The  only residual possibly uremic symptoms she has at this time are  persistent mild-to-moderate fatigue which was worse prior to starting  peritoneal dialysis.  Her nausea, vomiting, and anorexia have resolved,  and her appetite is currently good.  She is cooking and doing light  housework but cannot do too much.  She states that she is fully  compliant with her peritoneal dialysis regimen.   The patient presented earlier today with an acute episode of substernal  chest pain and tightness.  EKG showed possible new J-point elevation  according to Dr. Ron [name].  Cardiac enzymes were negative.  There is no  history of cardiac disease.  She is nonsmoker.  She does have  hypothyroidism, history of hypertension, IgA nephropathy, and chronic  cough felt secondary to gastroesophageal reflux  disease.  She has had a  previous barium swallow positive for reflux.  She apparently has a  history of asthmatic bronchitis and some obstructive lung disease.  Other past histories:  She has had surgery on her hands for tendon  inflammation.   FAMILY HISTORY:  Noncontributory.   SOCIAL HISTORY:  Married, lives with her husband and child.  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  No alcohol or tobacco or drug  use.   REVIEW OF SYSTEMS:  Denies any headache, fever, chills, sweats, or  weight loss.  Denies sore throat or difficulty swallowing.  She does  have chronic cough and has been seen by GI for this who are trying to  treat her with proton pump inhibitors as of last April.  RESPIRATORY:  Denies any hemoptysis, shortness of breath, or orthopnea.  CARDIAC:  As  above.  No history of angina, MI, or heart failure in the past.  She  never had any problems with volume overload. GI:  Denies any history of  peptic ulcer disease, liver disease, or pancreatitis.  No diarrhea,  constipation, or abdominal pain.  GU:  She does not void and has not  voided for a long time.  GYN:  No complaints.  MUSCULOSKELETAL:  She  had some hand surgery for inflamed tendons on the back of her right hand  and I believe on her left thumb.  She denies any history of arthritis or  current  ankle swelling.  NEUROLOGIC:  Denies history of focal weakness,  jerking of the extremities, confusion, or hallucination.  PSYCHIATRIC:  Negative.   PHYSICAL EXAMINATION:  VITAL SIGNS:  Temperature 98, pulse 80,  respirations 18, and blood pressure 110/80.  GENERAL:  This is a calm, alert, pleasant white female.  She is thin.  She is in no acute distress.  She is lying flat.  SKIN:  Warm and dry without rash.  HEENT:  PERRL, EOMI.  Throat is clear.  NECK:  Supple without JVD.  CHEST:  Clear throughout the bases.  CARDIAC:  Regular rate and rhythm without murmur, rub, or gallop.  ABDOMEN:  Soft and  nontender.  No ascites.  Active bowel sounds.  She  has a peritoneal catheter exit site which is clean and dry.  EXTREMITIES:  No edema, ulceration, or gangrene.  Good pulses on both  feet.  NEUROLOGIC:  No asterixis, 5/5 strength throughout.  The patient is  fully alert and oriented x3.   LABORATORY DATA:  Sodium 137, BUN 55, and creatinine 18.  White blood  count 6000, hemoglobin 12, platelets 366.   IMPRESSION:  1. Chronic kidney disease, on peritoneal dialysis for four and half      weeks.  She does 1500 mL exchanges 5 times a day, the first one at      3 p.m. and other four overnight.  She is full during the day.  2. Chest pain; I doubt that the patient is uremic since her earlier      uremic symptoms prior to starting dialysis resolved promptly with      initiation of peritoneal dialysis, and the patient has been fully      compliant it appears.  This makes uremic pericarditis less likely      as the cause of her acute chest pain episode, though not, of      course, entirely ruled out.  Would not recommend changing her      dialysis prescription at this time.  3. History of hypertension.  4. History of IgA nephropathy.  5. History of chronic cough, felt due to gastroesophageal reflux      disease.      Sol Blazing, M.D.  Electronically Signed     RDS/MEDQ  D:  10/14/2008  T:  10/15/2008  Job:  [id number]

## 2011-02-05 NOTE — Discharge Summary (Signed)
NAMEMarland Kitchen  Kimberly [name], Kimberly [name]              ACCOUNT NO.:  [id number]   MEDICAL RECORD NO.:  [id number]          PATIENT TYPE:  INP   LOCATION:  [id number]                         FACILITY:  [facility]   PHYSICIAN:  Carlena Bjornstad, MD, FACCDATE OF BIRTH:  11-02-1955   DATE OF ADMISSION:  10/14/2008  DATE OF DISCHARGE:  10/15/2008                               DISCHARGE SUMMARY   PROCEDURES:  None.   PRIMARY/FINAL DISCHARGE DIAGNOSIS:  Chest pain.   SECONDARY DIAGNOSES:  1. Chronic kidney disease, on peritoneal dialysis.  2. Chronic cough, possibly secondary to gastroesophageal reflux      disease.  3. Essential hypertension.  4. Asthma.  5. Hypothyroidism.  6. IgA neuropathy with chronic renal insufficiencies, BUN 64,      creatinine 18.48 at discharge.  7. Hypokalemia.   TIME AT DISCHARGE:  33 minutes.   HOSPITAL COURSE:  Kimberly [name] is a 55 year old female with no previous  history of coronary artery disease.  She had chest pain that was worse  with deep inspiration.  She came to the hospital and was admitted for  further evaluation.   Her cardiac enzymes were negative for MI.  She did have a myoglobin  greater than 500 on point-of-care markers.  Her GFR was 2.  She is on  peritoneal dialysis, and the renal team saw her in consult.  A 2-D  echocardiogram was ordered but has not yet been completed.  She had no  rub on exam.  Her chest x-ray did not show cardiac enlargement.   On October 15, 2008, Kimberly [name] was seen by Dr. Ron [name].  He did not feel  as acute coronary syndrome.  He could not rule out pericarditis, but  this was not supported by physical exam.  He also felt that we cannot  rule out renal pericarditis, but Dr. Jonnie Finner was not convinced.  Dr.  Ron [name] felt that Kimberly [name] was improved such that she could be  discharged home safely and follow up in the office as an outpatient.   DISCHARGE INSTRUCTIONS:  Her activity level is to be increased  gradually.  She is to stick to a renal  diet.  She is to follow up with  Dr. Ron [name] and our office will call her.  She is to follow up with Dr.  Moshe Cipro as needed or as scheduled.   DISCHARGE MEDICATIONS:  1. Synthroid 112 mcg daily.  2. Calcitriol 0.25 mcg daily.  3. Diazepam 5 mg nightly.  4. Amlodipine 10 mg daily.  5. Epogen 20,000 units subcu twice a week.  6. Tylenol 650 mg p.r.n.  7. Ibuprofen 200 mg 2 tablets t.i.d. if okay with the renal team.      Rosaria Ferries, PA-C      Carlena Bjornstad, MD, [facility]  Electronically Signed    RB/MEDQ  D:  10/15/2008  T:  10/15/2008  Job:  [id number]   cc:   Louis Meckel, M.D.

## 2011-02-05 NOTE — H&P (Signed)
NAMEMarland [name]  ESCARLETT, STEGE NO.:  [id number]   MEDICAL RECORD NO.:  [id number]          PATIENT TYPE:  INP   LOCATION:  [id number]                         FACILITY:  [facility]   PHYSICIAN:  Carlena Bjornstad, MD, FACCDATE OF BIRTH:  June 02, 1956   DATE OF ADMISSION:  10/14/2008  DATE OF DISCHARGE:                              HISTORY & PHYSICAL   PRIMARY CARDIOLOGIST:  Carlena Bjornstad, MD, [facility]   PRIMARY CARE PHYSICIAN:  [facility].   HISTORY OF PRESENT ILLNESS:  A 55 year old Caucasian female without  prior history of CAD, but with a history of end-stage renal disease,  chronic cough, and essential hypertension with complaints of substernal  chest pain worsening with inspiration.  As the patient was evaluated in  the emergency room, it was found that the patient had some elevated J-  points inferolaterally and we were asked to evaluate.  The patient has  not had episodes of chest pain in the past.  She does have some history  of IgA neuropathy.  She was given nitroglycerin, aspirin, and Vicodin  for chest pain relief and is feeling much better, however, she is having  some continued soreness with inspiration.   REVIEW OF SYSTEMS:  Positive for chest pain and inspiratory chest  discomfort.  Negative for nausea, vomiting, diaphoresis, or presyncope.   SOCIAL HISTORY:  The patient lives in [place] with her husband.  She  is a Chief Strategy Officer.  She does not smoke, does not drink alcohol, and  no drug use.   FAMILY HISTORY:  Negative for CAD in both parents.   PAST MEDICAL HISTORY:  1. 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  2. History of asthma.  3. Chronic thyroid dysfunction.  4. IgA neuropathy with chronic renal insufficiency, baseline of 3.5.  5. Chronic cough related to GERD and hypothyroidism.   PAST SURGICAL HISTORY:  Placement of right [place] continuous  ambulatory  peritoneal dialysis catheter in December 2009.   CURRENT MEDICATIONS AT HOME:  Amlodipine, calcitriol, Epogen,  Levothroid, diazepam, Symbicort, and Spiriva, the patient does not know  doses.   ALLERGIES:  SULFA.   CURRENT LABORATORY DATA:  Sodium 137, potassium 3.5, chloride 94, CO2  23, BUN 55, creatinine 18.3, and glucose 92.  Hemoglobin 12.6,  hematocrit 40.1, white blood cells 6.7, platelets 366, and D-dimer 0.26.  CK 60, MV 2.3, and troponin 0.02.  Chest x-ray revealing no acute  cardiopulmonary process, stable chronic blunting of the costophrenic  sulci, stable changes of COPD, minimal bibasilar atelectasis or  scarring.  EKG revealing normal sinus rhythm, ventricular rate of 93  beats per minute with J-point elevation noted laterally and inferiorly.   PHYSICAL EXAMINATION:  VITAL SIGNS:  Blood pressure 107/80, pulse 103,  respirations 18, temperature 98.0, and O2 sat 100% on room air.  HEENT:  Head is normocephalic and atraumatic.  Eyes, PERRLA.  Mucous  membranes of mouth, pink and moist.  Tongue is midline.  NECK:  Supple without JVD or carotid bruits appreciated.  CARDIOVASCULAR:  Regular rate and  rhythm without murmurs, rubs, or  gallops.  Pulses are 2+ and equal without bruits.  LUNGS:  Clear to auscultation without wheezes, rales, or rhonchi.  ABDOMEN:  Soft, nontender, mild distention with abdominal catheter  noted.  EXTREMITIES:  Without clubbing, cyanosis, or edema.  NEUROLOGIC:  Cranial nerves II through XII are grossly intact.   IMPRESSION:  1. Hypertension.  2. Chronic cough, related to gastroesophageal reflux disease.  3. Hypothyroidism.  4. Immunoglobulin A neuropathy with end-stage renal disease and      peritoneal dialysis since December 2009.  5. Chest pain, Vicodin had helped.  EKG when compared to December      2009, shows mild diffuse J-point elevation with first troponin      normal limits.  Creatinine is elevated at 18.32 with a BUN of 55      and  potassium of __________   PLAN:  The pain is worse with inspiration and somewhat positional.  We  do not hear rub on her physical exam.  Chest x-ray shows no increased  heart size.  We will admit rule out MI, doubt that this is the cause of  her discomfort.  We will consider pericarditis for her renal status.  We  have talked with Dr. Jonnie Finner, renal physician to continue renal  management as she is an end-stage renal patient for their  recommendations to continued care.      Phill Myron. Purcell Nails, NP      Carlena Bjornstad, MD, [facility]  Electronically Signed    KML/MEDQ  D:  10/14/2008  T:  10/15/2008  Job:  [id number]

## 2011-02-05 NOTE — Assessment & Plan Note (Signed)
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 Annamaria Boots for evaluation of chronic cough.   For at least 7-8 years, Kimberly [name] has had a nonproductive dry cough  without hemoptysis.  Apparently, she had a previous 24-hour pH probe  testing and a trial of prolonged PPI therapy in [place], [place]  to no avail.  She continues with a dry nonproductive cough almost daily  with some early morning gagging but denies any other problems except for  some chronic hoarseness.  She, in the past, has been told that she has  asthmatic bronchitis and has moderate obstructive lung disease.  She  recently had a pulmonary consultation with Dr.Clinton Young on December 13, 2007, was placed on a variety of inhalers and Spiriva daily.   The patient denies burning substernal chest pain, regurgitation, or  dysphagia.  She has no hepatobiliary complaints.  She follows a regular  diet and her appetite is good.  She denies Raynaud's phenomenon or other  symptoms of collagen vascular disease and has apparently had a negative  extensive neurologic workup but because of an associated IgA nephropathy  and chronic kidney disease, she is on renal transplantation list.  She  does suffer from chronic anemia and is on Procrit injections.  Because  of her symptomatology, she underwent a barium swallow on November 12, 2007, which showed reflux at the level of the cervical esophagus without  any definite hiatal hernia.  The patient currently is not on any acid  suppressants and denies use of antacids.   She is having regular bowel movements, denies melena or hematochezia but  has never had a colonoscopy screening.  She denies any  specific food  intolerances or history of atopic dermatitis, psoriasis, etcetera.  She  has had extensive skin testing for allergies and foods.   PAST MEDICAL HISTORY:  Remarkable for:  1. Surgery on her hands with tendon releases.  2. Essential hypertension.  3. Questionable asthma.  4. Chronic thyroid dysfunction.  5. IgA nephropathy with chronic renal insufficiency with a creatinine      running approximately 3.5 mg%.   She has not had previous abdominal or gynecologic surgery.   FAMILY HISTORY:  Noncontributory in terms of gastrointestinal problems.  Her mother did have uterine carcinoma.   SOCIAL HISTORY:  She is married, lives with her husband and child.  She  has a Gaffer, works as a Chief Strategy Officer.  She does not smoke  or abuse ethanol.   REVIEW OF SYSTEMS:  Negative except for some arthritic joint pains in  her hands.  She does have chronic anemia and a history of a vague  periodic or benign arrhythmia.  She also describes shortness of breath  with exertion.  Her last menstrual period was 4 years ago.  She denies  any  chronic neuropsychiatric problems.   PHYSICAL EXAMINATION:  GENERAL:  She is a healthy-appearing white female  in no acute distress appearing her stated age.  I could not appreciate  stigmata of chronic liver disease, thyromegaly, or lymphadenopathy.  VITAL SIGNS:  She is 5 feet 6 inches, weighs 155 pounds, blood pressure  is 124/86, pulse 68 and regular.  CHEST:  Clear.  HEART:  Regular rhythm without murmurs, gallops, or rubs.  ABDOMEN:  There was no hepatosplenomegaly, abdominal masses, or  significant tenderness.  Bowel sounds were normal and peripheral  extremities were unremarkable.  EXTREMITIES:  There was no edema, phlebitis, or evidence of vascular  changes in her hands or feet.  MENTAL STATUS:  Clear.  RECTAL:  Deferred.   ASSESSMENT:  1. Chronic cough possibly related to occult acid reflux with marked      acid sensitivity,  although her history would certainly speak      against this possibility, although I am unsure as to whether or not      she had a long term adequate trial of proton pump inhibitor      therapy.  Her dosage of proton pump inhibitor is somewhat limited      by her chronic renal insufficiency.  She also relates that she had      recent ear, nose, and throat exam that was unremarkable but I do      not have these notes.  2. IgA nephropathy with chronic renal insufficiency.  The patient is      apparently on a renal transplant list but is not on      immunosuppressive at this time.  3. Need for screening colonoscopy exam.  4. Possible asthmatic bronchitis related to acid reflux.  5. Chronic thyroid dysfunction on thyroid replacement therapy.  6. Essential hypertension, well controlled.   RECOMMENDATIONS:  1. Strict antireflux maneuvers explained to the patient and patient      education movie on acid reflux.  2. Trial of Kapidex 60 mg thirty minutes before breakfast each morning      on a long term chronic basis.  3. The patient does need endoscopy which has never been completed and      also we will obtain biopsies to exclude eosinophilic esophagitis.  4. Screening colonoscopy exam.  5. Continue other medications per Dr. Annamaria Boots and Dr. Sharol Roussel.     Kimberly [name]. Kimberly Iles, MD, Quentin Ore, FAGA  Electronically Signed    DRP/MedQ  DD: 12/24/2007  DT: 12/24/2007  Job #: [id number]   cc:   Coletta Memos, M.D.  Clinton D. Annamaria Boots, MD, FCCP, FACP

## 2011-02-08 NOTE — Procedures (Signed)
NAME:  AREEJ, Kimberly [name]              ACCOUNT NO.:  [id number]   MEDICAL RECORD NO.:  [id number]          PATIENT TYPE:  OUT   LOCATION:  SLEEP CENTER                 [facility]ACILITY[facility]  [facility]   PHYSICIAN:  Clinton D. Annamaria Boots, M.D. DATE OF BIRTH:  1956-01-24   DATE OF STUDY:  06/22/2005                              NOCTURNAL POLYSOMNOGRAM   REFERRING PHYSICIAN:  Dr. Minna Merritts.   DATE OF STUDY:  June 22, 2005.   INDICATION FOR STUDY:  Hypersomnia with sleep apnea. Epworth sleepiness  score 16/24, BMI of 21, weight 135 pounds.   SLEEP ARCHITECTURE:  Total sleep time 349 minutes with sleep efficiency 83%.  Stage I was 7%, stage II 57%, stages III and IV 17%, REM 19% of total sleep  time. Sleep latency 18 minutes, REM latency is 137 minutes, awake after  sleep onset 54 minutes, arousal index increased at 34. No bedtime medication  taken.   RESPIRATORY DATA:  Apnea/hypopnea index (AHI, RDI) 0.2 obstructive events  per hour, reflecting a single hypopnea reported while sleeping supine. This  is within normal limits.   OXYGEN DATA: Mild to moderate snoring (1 to 2 on a scale of 0 to 4) with  oxygen desaturation a transient nadir of 85%. Mean oxygen saturation through  the study was 96% on room air.   CARDIAC DATA:  Sinus rhythm with sinus bradycardia, 48-50 per minute.   MOVEMENT/PARASOMNIA:  A total of 35 limb jerks were reported of which 14  were associated with arousal or awakening for periodic limb movement with  arousal index of 2.4 per hour which is mildly increased.   IMPRESSION/RECOMMENDATIONS:  1.  Insignificant sleep disordered breathing, AHI 0.2 per hour with mild to      moderate snoring and normal oxygenation. Technician commented on mouth      breathing.  2.  Sleep architecture was not remarkable for sleep center environment but      with some increased fragmentation and brief arousals which may be      indicative of her sleep pattern at home as well, potentially treated  as      insomnia.  3.  Mild periodic limb movement with arousal, 2.4 per hour.      Clinton D. Annamaria Boots, M.D.  Diplomate, [employer] of [employer] Medicine  Electronically Signed     CDY/MEDQ  D:  06/30/2005 09:41:46  T:  06/30/2005 22:03:05  Job:  [id number]

## 2011-03-24 DIAGNOSIS — B029 Zoster without complications: Secondary | ICD-10-CM

## 2011-03-24 HISTORY — DX: Zoster without complications: B02.9

## 2011-04-24 HISTORY — PX: URETER REVISION: SHX493

## 2011-06-13 LAB — HEMOGLOBIN AND HEMATOCRIT, BLOOD
HCT: 35.5 — ABNORMAL LOW
Hemoglobin: 11.8 — ABNORMAL LOW

## 2011-06-13 LAB — PTH, INTACT AND CALCIUM
Calcium, Total (PTH): 8.9
PTH: 160.3 — ABNORMAL HIGH

## 2011-06-13 LAB — RENAL FUNCTION PANEL
CO2: 23
Calcium: 9
Creatinine, Ser: 3.72 — ABNORMAL HIGH
GFR calc Af Amer: 16 — ABNORMAL LOW
GFR calc non Af Amer: 13 — ABNORMAL LOW
Phosphorus: 4.9 — ABNORMAL HIGH
Sodium: 139

## 2011-06-17 LAB — POCT HEMOGLOBIN-HEMACUE: Hemoglobin: 11.7 — ABNORMAL LOW

## 2011-06-17 LAB — RENAL FUNCTION PANEL
CO2: 26
Chloride: 106
GFR calc Af Amer: 14 — ABNORMAL LOW
GFR calc non Af Amer: 12 — ABNORMAL LOW
Sodium: 140

## 2011-06-17 LAB — PTH, INTACT AND CALCIUM
Calcium, Total (PTH): 9.2
PTH: 131.2 — ABNORMAL HIGH

## 2011-06-17 LAB — IRON AND TIBC
Iron: 87
UIBC: 183

## 2011-06-17 LAB — VITAMIN D 25 HYDROXY (VIT D DEFICIENCY, FRACTURES): Vit D, 25-Hydroxy: 36 (ref 30–89)

## 2011-06-17 LAB — TSH: TSH: 1.03

## 2011-06-18 LAB — RENAL FUNCTION PANEL
CO2: 20
Calcium: 9
Creatinine, Ser: 4.16 — ABNORMAL HIGH
Glucose, Bld: 92

## 2011-06-18 LAB — HEMOGLOBIN AND HEMATOCRIT, BLOOD
HCT: 33 — ABNORMAL LOW
Hemoglobin: 11.2 — ABNORMAL LOW

## 2011-06-24 LAB — POCT I-STAT 4, (NA,K, GLUC, HGB,HCT)
HCT: 27 — ABNORMAL LOW
Hemoglobin: 9.2 — ABNORMAL LOW
Sodium: 138

## 2011-06-25 LAB — POCT HEMOGLOBIN-HEMACUE: Hemoglobin: 9.9 g/dL — ABNORMAL LOW (ref 12.0–15.0)

## 2011-06-27 LAB — CBC
MCHC: 34.7 g/dL (ref 30.0–36.0)
Platelets: 131 10*3/uL — ABNORMAL LOW (ref 150–400)
RBC: 3.49 MIL/uL — ABNORMAL LOW (ref 3.87–5.11)

## 2011-06-27 LAB — BASIC METABOLIC PANEL
BUN: 141 mg/dL — ABNORMAL HIGH (ref 6–23)
CO2: 16 mEq/L — ABNORMAL LOW (ref 19–32)
Calcium: 8.8 mg/dL (ref 8.4–10.5)
Creatinine, Ser: 19.3 mg/dL — ABNORMAL HIGH (ref 0.4–1.2)
GFR calc Af Amer: 2 mL/min — ABNORMAL LOW (ref >60)

## 2011-06-27 LAB — DIFFERENTIAL
Basophils Absolute: 0 10*3/uL (ref 0.0–0.1)
Basophils Relative: 0 % (ref 0–1)
Monocytes Relative: 6 % (ref 3–12)
Neutro Abs: 4.4 10*3/uL (ref 1.7–7.7)
Neutrophils Relative %: 73 % (ref 43–77)

## 2011-06-27 LAB — POCT HEMOGLOBIN-HEMACUE: Hemoglobin: 7.2 g/dL — CL (ref 12.0–15.0)

## 2011-06-28 LAB — RENAL FUNCTION PANEL
Albumin: 3.4 — ABNORMAL LOW
CO2: 25
Chloride: 105
GFR calc Af Amer: 17 — ABNORMAL LOW
GFR calc non Af Amer: 14 — ABNORMAL LOW
Potassium: 3.8
Sodium: 140

## 2011-06-28 LAB — IRON AND TIBC
Iron: 111
Saturation Ratios: 37
TIBC: 299
UIBC: 188

## 2011-06-28 LAB — CBC
MCV: 86.8
RBC: 3.87
WBC: 4.3

## 2011-07-02 LAB — RENAL FUNCTION PANEL
BUN: 86 — ABNORMAL HIGH
CO2: 22
Calcium: 9
Glucose, Bld: 98
Phosphorus: 6.1 — ABNORMAL HIGH

## 2011-07-02 LAB — IRON AND TIBC
Saturation Ratios: 38
UIBC: 174

## 2011-07-02 LAB — PTH, INTACT AND CALCIUM: PTH: 80.5 — ABNORMAL HIGH

## 2011-07-02 LAB — HEMOGLOBIN AND HEMATOCRIT, BLOOD: HCT: 35.3 — ABNORMAL LOW

## 2011-07-02 LAB — FERRITIN: Ferritin: 149 (ref 10–291)

## 2011-07-03 LAB — RENAL FUNCTION PANEL
Albumin: 3.2 — ABNORMAL LOW
Chloride: 104
GFR calc Af Amer: 16 — ABNORMAL LOW
GFR calc non Af Amer: 13 — ABNORMAL LOW
Potassium: 4.3

## 2011-07-03 LAB — FERRITIN: Ferritin: 106 (ref 10–291)

## 2011-07-03 LAB — IRON AND TIBC
Iron: 99
TIBC: 287
UIBC: 188

## 2011-07-03 LAB — PTH, INTACT AND CALCIUM: Calcium, Total (PTH): 9.3

## 2011-07-05 LAB — RENAL FUNCTION PANEL
Albumin: 3.2 — ABNORMAL LOW
Chloride: 108
GFR calc Af Amer: 17 — ABNORMAL LOW
GFR calc non Af Amer: 14 — ABNORMAL LOW
Potassium: 3.7

## 2011-07-05 LAB — PTH, INTACT AND CALCIUM: Calcium, Total (PTH): 8.8

## 2011-07-05 LAB — CBC
Hemoglobin: 12.7
RBC: 4.36

## 2011-07-08 LAB — IRON AND TIBC
Saturation Ratios: 28
TIBC: 268

## 2011-07-08 LAB — RENAL FUNCTION PANEL
CO2: 25
GFR calc non Af Amer: 13 — ABNORMAL LOW
Glucose, Bld: 153 — ABNORMAL HIGH
Phosphorus: 5.1 — ABNORMAL HIGH
Potassium: 4.3
Sodium: 139

## 2011-07-08 LAB — PTH, INTACT AND CALCIUM: PTH: 89.9 — ABNORMAL HIGH

## 2011-07-19 ENCOUNTER — Other Ambulatory Visit (HOSPITAL_COMMUNITY)
Admission: RE | Admit: 2011-07-19 | Discharge: 2011-07-19 | Disposition: A | Discharge status: Home / Self Care | Payer: BC Managed Care – PPO | Source: Ambulatory Visit | Attending: Family Medicine | Admitting: Family Medicine

## 2011-07-19 ENCOUNTER — Encounter: Payer: Self-pay | Admitting: Internal Medicine

## 2011-07-19 ENCOUNTER — Other Ambulatory Visit: Payer: Self-pay | Admitting: Physician Assistant

## 2011-07-19 DIAGNOSIS — Z0000 Encounter for general adult medical examination without abnormal findings: Secondary | ICD-10-CM | POA: Insufficient documentation

## 2011-10-05 ENCOUNTER — Encounter: Payer: Self-pay | Admitting: Internal Medicine

## 2011-10-07 ENCOUNTER — Encounter: Payer: Self-pay | Admitting: Internal Medicine

## 2011-11-29 ENCOUNTER — Other Ambulatory Visit: Payer: Self-pay

## 2011-11-29 ENCOUNTER — Emergency Department (HOSPITAL_COMMUNITY): Payer: BC Managed Care – PPO

## 2011-11-29 ENCOUNTER — Encounter (HOSPITAL_COMMUNITY): Payer: Self-pay | Admitting: Emergency Medicine

## 2011-11-29 ENCOUNTER — Inpatient Hospital Stay (HOSPITAL_COMMUNITY)
Admission: EM | Admit: 2011-11-29 | Discharge: 2011-12-01 | DRG: 014 | Disposition: A | Discharge status: Home / Self Care | Payer: BC Managed Care – PPO | Attending: Internal Medicine | Admitting: Internal Medicine

## 2011-11-29 DIAGNOSIS — N028 Recurrent and persistent hematuria with other morphologic changes: Secondary | ICD-10-CM | POA: Insufficient documentation

## 2011-11-29 DIAGNOSIS — K219 Gastro-esophageal reflux disease without esophagitis: Secondary | ICD-10-CM | POA: Diagnosis present

## 2011-11-29 DIAGNOSIS — E118 Type 2 diabetes mellitus with unspecified complications: Secondary | ICD-10-CM | POA: Diagnosis present

## 2011-11-29 DIAGNOSIS — IMO0002 Reserved for concepts with insufficient information to code with codable children: Secondary | ICD-10-CM

## 2011-11-29 DIAGNOSIS — I6350 Cerebral infarction due to unspecified occlusion or stenosis of unspecified cerebral artery: Principal | ICD-10-CM | POA: Diagnosis present

## 2011-11-29 DIAGNOSIS — Z940 Kidney transplant status: Secondary | ICD-10-CM | POA: Diagnosis present

## 2011-11-29 DIAGNOSIS — D649 Anemia, unspecified: Secondary | ICD-10-CM | POA: Diagnosis present

## 2011-11-29 DIAGNOSIS — I639 Cerebral infarction, unspecified: Secondary | ICD-10-CM | POA: Diagnosis present

## 2011-11-29 DIAGNOSIS — Z794 Long term (current) use of insulin: Secondary | ICD-10-CM

## 2011-11-29 DIAGNOSIS — E039 Hypothyroidism, unspecified: Secondary | ICD-10-CM | POA: Diagnosis present

## 2011-11-29 DIAGNOSIS — I10 Essential (primary) hypertension: Secondary | ICD-10-CM | POA: Diagnosis present

## 2011-11-29 DIAGNOSIS — Z79899 Other long term (current) drug therapy: Secondary | ICD-10-CM

## 2011-11-29 HISTORY — DX: Anemia, unspecified: D64.9

## 2011-11-29 HISTORY — DX: Recurrent and persistent hematuria with other morphologic changes: N02.8

## 2011-11-29 HISTORY — DX: Other recurrent and persistent immunoglobulin A nephropathy: N02.B9

## 2011-11-29 HISTORY — DX: Kidney transplant status: Z94.0

## 2011-11-29 HISTORY — DX: Cerebral infarction, unspecified: I63.9

## 2011-11-29 HISTORY — DX: Gastro-esophageal reflux disease without esophagitis: K21.9

## 2011-11-29 HISTORY — DX: Hypothyroidism, unspecified: E03.9

## 2011-11-29 HISTORY — DX: Essential (primary) hypertension: I10

## 2011-11-29 LAB — COMPREHENSIVE METABOLIC PANEL
ALT: 14 U/L (ref 0–35)
AST: 12 U/L (ref 0–37)
Alkaline Phosphatase: 174 U/L — ABNORMAL HIGH (ref 39–117)
CO2: 19 mEq/L (ref 19–32)
Calcium: 9.5 mg/dL (ref 8.4–10.5)
Chloride: 110 mEq/L (ref 96–112)
GFR calc Af Amer: 18 mL/min — ABNORMAL LOW (ref >90)
GFR calc non Af Amer: 16 mL/min — ABNORMAL LOW (ref >90)
Glucose, Bld: 139 mg/dL — ABNORMAL HIGH (ref 70–99)
Potassium: 4.4 mEq/L (ref 3.5–5.1)
Sodium: 141 mEq/L (ref 135–145)
Total Bilirubin: 0.2 mg/dL — ABNORMAL LOW (ref 0.3–1.2)

## 2011-11-29 LAB — CBC
MCV: 87.6 fL (ref 78.0–100.0)
Platelets: 205 10*3/uL (ref 150–400)
RBC: 3.86 MIL/uL — ABNORMAL LOW (ref 3.87–5.11)
WBC: 8.1 10*3/uL (ref 4.0–10.5)

## 2011-11-29 LAB — DIFFERENTIAL
Eosinophils Relative: 0 % (ref 0–5)
Lymphocytes Relative: 5 % — ABNORMAL LOW (ref 12–46)
Lymphs Abs: 0.4 10*3/uL — ABNORMAL LOW (ref 0.7–4.0)
Neutro Abs: 7.5 10*3/uL (ref 1.7–7.7)

## 2011-11-29 LAB — GLUCOSE, CAPILLARY: Glucose-Capillary: 98 mg/dL (ref 70–99)

## 2011-11-29 MED ORDER — TACROLIMUS 1 MG PO CAPS
1.0000 mg | ORAL_CAPSULE | Freq: Every day | ORAL | Status: DC
  Administered 2011-11-29 – 2011-11-30 (×2): 1 mg via ORAL
  Filled 2011-11-29 (×4): qty 1

## 2011-11-29 MED ORDER — ENOXAPARIN SODIUM 40 MG/0.4ML ~~LOC~~ SOLN: 40.0000 mg | SUBCUTANEOUS | Status: DC

## 2011-11-29 MED ORDER — AMLODIPINE BESYLATE 10 MG PO TABS
10.0000 mg | ORAL_TABLET | Freq: Every day | ORAL | Status: DC
  Administered 2011-11-30 – 2011-12-01 (×2): 10 mg via ORAL
  Filled 2011-11-29 (×2): qty 1

## 2011-11-29 MED ORDER — ASPIRIN EC 325 MG PO TBEC
325.0000 mg | DELAYED_RELEASE_TABLET | Freq: Every day | ORAL | Status: DC
  Administered 2011-11-29: 325 mg via ORAL
  Filled 2011-11-29: qty 1

## 2011-11-29 MED ORDER — MYCOPHENOLATE SODIUM 180 MG PO TBEC
180.0000 mg | DELAYED_RELEASE_TABLET | Freq: Three times a day (TID) | ORAL | Status: DC
  Administered 2011-11-29 – 2011-12-01 (×5): 180 mg via ORAL
  Filled 2011-11-29 (×7): qty 1

## 2011-11-29 MED ORDER — SENNOSIDES-DOCUSATE SODIUM 8.6-50 MG PO TABS: 1.0000 | ORAL_TABLET | Freq: Every evening | ORAL | Status: DC | PRN

## 2011-11-29 MED ORDER — LEVOTHYROXINE SODIUM 88 MCG PO TABS
88.0000 ug | ORAL_TABLET | Freq: Every day | ORAL | Status: DC
  Administered 2011-11-30 – 2011-12-01 (×2): 88 ug via ORAL
  Filled 2011-11-29 (×3): qty 1

## 2011-11-29 MED ORDER — ASPIRIN 325 MG PO TABS
325.0000 mg | ORAL_TABLET | Freq: Every day | ORAL | Status: DC
  Filled 2011-11-29: qty 1

## 2011-11-29 MED ORDER — INSULIN ASPART 100 UNIT/ML ~~LOC~~ SOLN
0.0000 [IU] | Freq: Three times a day (TID) | SUBCUTANEOUS | Status: DC
  Administered 2011-11-30: 1 [IU] via SUBCUTANEOUS
  Filled 2011-11-29: qty 3

## 2011-11-29 MED ORDER — ASPIRIN 300 MG RE SUPP
300.0000 mg | Freq: Every day | RECTAL | Status: DC
  Filled 2011-11-29: qty 1

## 2011-11-29 MED ORDER — SODIUM CHLORIDE 0.9 % IV SOLN
INTRAVENOUS | Status: DC
  Administered 2011-11-29 – 2011-11-30 (×2): via INTRAVENOUS

## 2011-11-29 MED ORDER — TACROLIMUS 1 MG PO CAPS
2.0000 mg | ORAL_CAPSULE | Freq: Every day | ORAL | Status: DC
  Administered 2011-11-30 – 2011-12-01 (×2): 2 mg via ORAL
  Filled 2011-11-29 (×2): qty 2

## 2011-11-29 MED ORDER — PREDNISONE (PAK) 5 MG PO TABS
5.0000 mg | ORAL_TABLET | Freq: Every day | ORAL | Status: DC
  Administered 2011-11-30 – 2011-12-01 (×2): 5 mg via ORAL
  Filled 2011-11-29 (×2): qty 21

## 2011-11-29 NOTE — H&P (Signed)
PCP:   Reginia Naas, MD, MD   Chief Complaint:  numbness  HPI: Kimberly [name] is an 56 y.o. female with history stroke 2010 affecting right hand along with kidney failure and transplant 2011. Patient awoke this am WNL, at 7:30 she noted tingling in lefthand then left face. These symptoms came and went throughout the day which prompted her to come to ED. At present time here symptoms have resolved however her MRI did show a small left parietal-occipital lobe infarct. Currently she has no other symptoms,    Review of Systems:  The patient denies anorexia, fever, weight loss,, vision loss, decreased hearing, hoarseness, chest pain, syncope, dyspnea on exertion, peripheral edema, balance deficits, hemoptysis, abdominal pain, melena, hematochezia, severe indigestion/heartburn, hematuria, incontinence, genital sores, muscle weakness, suspicious skin lesions, transient blindness, difficulty walking, depression, unusual weight change, abnormal bleeding,   Past Medical History: Past Medical History  Diagnosis Date  . Stroke   . Kidney transplanted   . Hypertension   . Diabetes mellitus   . IgA nephropathy   . Hypothyroidism   . Anemia   . GERD (gastroesophageal reflux disease)    Past Surgical History  Procedure Date  . Kidney transplant   . Ureter revision     Medications: Prior to Admission medications   Medication Sig Start Date End Date Taking? Authorizing Provider  amLODipine (NORVASC) 10 MG tablet Take 10 mg by mouth daily.   Yes Historical Provider, MD  insulin aspart (NOVOLOG) 100 UNIT/ML injection Inject 2 Units into the skin 3 (three) times daily as needed. For high sugar   Yes Historical Provider, MD  levothyroxine (SYNTHROID, LEVOTHROID) 88 MCG tablet Take 88 mcg by mouth daily.   Yes Historical Provider, MD  mycophenolate (MYFORTIC) 180 MG EC tablet Take 180 mg by mouth 3 (three) times daily.   Yes Historical Provider, MD  predniSONE (STERAPRED UNI-PAK) 5 MG TABS  Take 5 mg by mouth daily.   Yes Historical Provider, MD  tacrolimus (PROGRAF) 1 MG capsule Take 1-2 mg by mouth 2 (two) times daily. 2 mg in the am and 1mg  at night   Yes Historical Provider, MD    Allergies:   Allergies  Allergen Reactions  . Dapsone Rash    Pain in feet  . Sulfonamide Derivatives Rash    Social History:  reports that she has never smoked. She has never used smokeless tobacco. She reports that she does not drink alcohol or use illicit drugs.  History   Social History Narrative  . No narrative on file     Family History: History reviewed. No pertinent family history.  Physical Exam: Filed Vitals:   11/29/11 1548 11/29/11 1615 11/29/11 1725 11/29/11 1825  BP: 151/87 144/82  139/84  Pulse: 70 76  66  Temp: 98.9 F (37.2 C)   98.2 F (36.8 C)  TempSrc: Oral   Oral  Resp:  20  18  Height:   5\' 6"  (1.676 m)   Weight:   68 kg (149 lb 14.6 oz)   SpO2: 100% 100%  100%   Alert and oriented x3 Cvs: rrr Rs: ctab  Abdomen : soft, NT Skin pale and dry    Labs on Admission:   [facility] 11/29/11 1328  NA 141  K 4.4  CL 110  CO2 19  GLUCOSE 139*  BUN 41*  CREATININE 3.12*  CALCIUM 9.5  MG --  PHOS --    Basename 11/29/11 1328  AST 12  ALT 14  ALKPHOS  174*  BILITOT 0.2*  PROT 6.7  ALBUMIN 3.9   No results found for this basename: LIPASE:2,AMYLASE:2 in the last 72 hours  Basename 11/29/11 1328  WBC 8.1  NEUTROABS 7.5  HGB 10.6*  HCT 33.8*  MCV 87.6  PLT 205    Radiological Exams on Admission: Mr Virgel Paling Wo Contrast  11/29/2011  *RADIOLOGY REPORT*  Clinical Data: Facial numbness.  MRA HEAD WITHOUT CONTRAST  Technique: Angiographic images of the Circle of Willis were obtained using MRA technique without intravenous contrast.  Comparison: 11/29/2011 MR brain. 12/09/2009 and 07/09/2009 MR angiogram circle of Willis.  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  Anterior circulation without medium or large size  vessel significant stenosis or occlusion.  2 mm aneurysm along the superior margin of the distal M1 segment of the left middle cerebral artery (from which a vessel may arise) is unchanged.  Minimal bulge along the inferior aspect of the distal M1 segment of the right middle cerebral artery (from which a vessel arises) and the slightly bulbous appearance of the right middle cerebral artery bifurcation appear stable.  Distal vertebral arteries and basilar artery without significant stenosis.  The right PICA is not imaged on the present exam.  Mild branch vessel irregularity.  At the skull base there is mild dilation of the left internal carotid artery compared to the right.  IMPRESSION: No significant change.  Please see above.  Original Report Authenticated By: Doug Sou, M.D.   Mr Brain Wo Contrast  11/29/2011  *RADIOLOGY REPORT*  Clinical Data: Facial numbness.  Stroke 2010.  High blood pressure. Diabetes.  Kidney transplant.  MRI HEAD WITHOUT CONTRAST  Technique:  Multiplanar, multiecho pulse sequences of the brain and surrounding structures were obtained according to standard protocol without intravenous contrast.  Comparison: 12/09/2009.  Findings: Suggestion of tiny acute infarct at the right parietal - occipital lobe junction.  No intracranial hemorrhage.  Very mild white matter type changes may represent result of small vessel disease.  The patients remote left hemispheric infarct is not well delineated as encephalomalacia.  No hydrocephalus.  No intracranial mass lesion detected on this unenhanced exam.  Major intracranial vascular structures are patent.  Minimal to mild partial opacification mastoid air cells and minimal paranasal sinus mucosal thickening.  Slight heterogeneous appearance bone marrow stable.  IMPRESSION: Suggestion of tiny acute infarct at the right parietal - occipital lobe junction.  Please see above.  Original Report Authenticated By: Doug Sou, M.D.     Assessment/Plan Principal Problem:  *Stroke Active Problems:  HYPOTHYROIDISM  ANEMIA  HYPERTENSION  Kidney transplanted  DM type 2 causing complication  Minor stroke Admit overnight Start aspirin  Will order w/u per protocol Will continue ssi insulin and immunosuppressants  Alvon Nygaard 11/29/2011, 6:30 PM

## 2011-11-29 NOTE — ED Notes (Addendum)
Pt here for left arm numbness and numbness to left side of face starting at 0730 this am; pt sts hx of CVA in past; pt sts floaters and blurry vision intermittently x 2 weeks; grip strength equal and no obvious neuro deficits at present; EDP Dr Canary Brim aware

## 2011-11-29 NOTE — ED Notes (Signed)
[id number] Ready

## 2011-11-29 NOTE — ED Notes (Signed)
Attempted to call report but RN is unavailable

## 2011-11-29 NOTE — Consult Note (Signed)
[facility] STROKE CONSULT NOTE       Chief Complaint: stroke   HPI:    Kimberly [name] is an 56 y.o. female with history stroke 2010 affecting right hand along with kidney failure and transplant 2011. Patient awoke this am WNL, at 7:30 she noted tingling in lefthand then left face.  These symptoms came and went throughout the day which prompted her to come to ED.  At present time here symptoms have resolved however her MRI did show a small left parietal-occipital lobe infarct.    LSN: 07:30 AM tPA Given: No: rapidly resolving symptoms    Past Medical History  Diagnosis Date  . Stroke   . Kidney transplanted   . Hypertension   . Diabetes mellitus   . IgA nephropathy     History reviewed. No pertinent past surgical history.  History reviewed. No pertinent family history. Social History:  reports that she has never smoked. She does not have any smokeless tobacco history on file. She reports that she does not drink alcohol or use illicit drugs.  Allergies:  Allergies  Allergen Reactions  . Dapsone Rash    Pain in feet  . Sulfonamide Derivatives Rash   Results for orders placed during the hospital encounter of 11/29/11 (from the past 24 hour(s))  CBC     Status: Abnormal   Collection Time   11/29/11  1:28 PM      Component Value Range   WBC 8.1  4.0 - 10.5 (K/uL)   RBC 3.86 (*) 3.87 - 5.11 (MIL/uL)   Hemoglobin 10.6 (*) 12.0 - 15.0 (g/dL)   HCT 33.8 (*) 36.0 - 46.0 (%)   MCV 87.6  78.0 - 100.0 (fL)   MCH 27.5  26.0 - 34.0 (pg)   MCHC 31.4  30.0 - 36.0 (g/dL)   RDW 14.1  11.5 - 15.5 (%)   Platelets 205  150 - 400 (K/uL)  DIFFERENTIAL     Status: Abnormal   Collection Time   11/29/11  1:28 PM      Component Value Range   Neutrophils Relative 92 (*) 43 - 77 (%)   Neutro Abs 7.5  1.7 - 7.7 (K/uL)   Lymphocytes Relative 5 (*) 12 - 46 (%)   Lymphs Abs 0.4 (*) 0.7 - 4.0 (K/uL)   Monocytes Relative 3  3 - 12 (%)   Monocytes Absolute 0.3  0.1 - 1.0  (K/uL)   Eosinophils Relative 0  0 - 5 (%)   Eosinophils Absolute 0.0  0.0 - 0.7 (K/uL)   Basophils Relative 0  0 - 1 (%)   Basophils Absolute 0.0  0.0 - 0.1 (K/uL)  COMPREHENSIVE METABOLIC PANEL     Status: Abnormal   Collection Time   11/29/11  1:28 PM      Component Value Range   Sodium 141  135 - 145 (mEq/L)   Potassium 4.4  3.5 - 5.1 (mEq/L)   Chloride 110  96 - 112 (mEq/L)   CO2 19  19 - 32 (mEq/L)   Glucose, Bld 139 (*) 70 - 99 (mg/dL)   BUN 41 (*) 6 - 23 (mg/dL)   Creatinine, Ser 3.12 (*) 0.50 - 1.10 (mg/dL)   Calcium 9.5  8.4 - 10.5 (mg/dL)   Total Protein 6.7  6.0 - 8.3 (g/dL)   Albumin 3.9  3.5 - 5.2 (g/dL)   AST 12  0 - 37 (U/L)  ALT 14  0 - 35 (U/L)   Alkaline Phosphatase 174 (*) 39 - 117 (U/L)   Total Bilirubin 0.2 (*) 0.3 - 1.2 (mg/dL)   GFR calc non Af Amer 16 (*) >90 (mL/min)   GFR calc Af Amer 18 (*) >90 (mL/min)  APTT     Status: Normal   Collection Time   11/29/11  1:28 PM      Component Value Range   aPTT 25  24 - 37 (seconds)  PROTIME-INR     Status: Normal   Collection Time   11/29/11  1:28 PM      Component Value Range   Prothrombin Time 15.0  11.6 - 15.2 (seconds)   INR 1.16  0.00 - 1.49   POCT I-STAT TROPONIN I     Status: Normal   Collection Time   11/29/11  1:45 PM      Component Value Range   Troponin i, poc 0.03  0.00 - 0.08 (ng/mL)   Comment 3            Medications:    Prior to Admission: amlodipine, insulin, synthroid, myfortic, prednisone, prograf Scheduled:  Physical Examination: Blood pressure 151/87, pulse 70, temperature 98.9 F (37.2 C), temperature source Oral, resp. rate 16, SpO2 100.00%.  Neurologic Examination:   Mental Status: Alert, oriented, thought content appropriate.  Speech fluent without evidence of aphasia. Able to follow 3 step commands without difficulty. Cranial Nerves: II-Visual fields grossly intact but sis show some difficulty counting my fingers in left upper quadrant. III/IV/VI-Extraocular movements intact.   Pupils reactive bilaterally. V/VII-Smile symmetric VIII-grossly intact IX/X-normal gag XI-bilateral shoulder shrug XII-midline tongue extension Motor: 5/5 bilaterally with normal tone and bulk except left hip flexion which was slightly weaker Sensory: Pinprick and light touch intact throughout, bilaterally Deep Tendon Reflexes: 2+ and symmetric throughout Plantars: Downgoing bilaterally Cerebellar: Normal finger-to-nose, normal rapid alternating movements and normal heel-to-shin test.      Lab Results  Component Value Date/Time   CHOL  Value: 143        ATP III CLASSIFICATION:  <200     mg/dL   Desirable  200-239  mg/dL   Borderline High  >=240    mg/dL   High        07/08/2009 12:10 AM   TSH 0.573 Test methodology is 3rd generation TSH 07/09/2009 12:10 AM   Mr Brain Wo Contrast  11/29/2011  *RADIOLOGY REPORT*  Clinical Data: Facial numbness.  Stroke 2010.  High blood pressure. Diabetes.  Kidney transplant.  MRI HEAD WITHOUT CONTRAST  Technique:  Multiplanar, multiecho pulse sequences of the brain and surrounding structures were obtained according to standard protocol without intravenous contrast.  Comparison: 12/09/2009.  Findings: Suggestion of tiny acute infarct at the right parietal - occipital lobe junction.  No intracranial hemorrhage.  Very mild white matter type changes may represent result of small vessel disease.  The patients remote left hemispheric infarct is not well delineated as encephalomalacia.  No hydrocephalus.  No intracranial mass lesion detected on this unenhanced exam.  Major intracranial vascular structures are patent.  Minimal to mild partial opacification mastoid air cells and minimal paranasal sinus mucosal thickening.  Slight heterogeneous appearance bone marrow stable.  IMPRESSION: Suggestion of tiny acute infarct at the right parietal - occipital lobe junction.  Please see above.  Original Report Authenticated By: Doug Sou, M.D.    Assessment:    56 y.o.  female with tiny acute infarct in right parietal-occipital lobe junction.  Stroke  Risk Factors - diabetes mellitus and hypertension  Plan: 1. HgbA1c, fasting lipid panel 2. MRA  of the brain without contrast 3. PT consult, OT consult, Speech consult 4. Echocardiogram 5. Carotid dopplers 6. Prophylactic therapy-Antiplatelet med: Aspirin - dose 325 mg daily 7. Risk factor modification (BP control, blood sugar control) 8. Will have stroke MD follow in AM    Etta Quill PA-C [facility] [phone]  11/29/2011, 4:04 PM

## 2011-11-29 NOTE — ED Provider Notes (Signed)
History     CSN: [id number]  Arrival date & time 11/29/11  1123   First MD Initiated Contact with Patient 11/29/11 1300      Chief Complaint  Patient presents with  . Numbness    (Consider location/radiation/quality/duration/timing/severity/associated sxs/prior treatment) HPI  Patient relates about 7:30 this morning she was walking downstairs to get rate go to work and she started noticing her left palm felt cold and had some numbness in her left hand. She states about 10 AM she started getting numbness of her left lower lip and it went into her left cheek. She states it has been off and on but is mildly persistent now. She also states she's had blurred vision twice lasting a minute in the past couple weeks but not today. She also states she sees dark floaters off and on for the past few weeks. She relates she had a stroke in 2000 and that was felt to be from her taking Epogen and that involved the right side of her body with difficulty speaking and thinking of what she wanted to say. She states she had a mild headache earlier but was in the posterior area but not now. She denies having difficulty walking or using her hands to dress herself or do activities.  Patient is right-handed  Patient relates she was admitted in January when she had diarrhea for 3 weeks and at one point her heart rate got down to 29. She states her only symptom was she felt hot. She denied having any chest pain, shortness of breath. She states they were monitoring her for possible pacemaker in the future.  P. CP Dr. Harlan Stains Nephrologist Dr. Moshe Cipro but has not seen her in years All of her consultants are at [facility] History  Diagnosis Date  . Stroke   . Kidney transplanted May 2011   . Hypertension   . Diabetes mellitus   . IgA nephropathy     History reviewed. No pertinent past surgical history. Renal transplant  History reviewed. No pertinent family history.  History  Substance  Use Topics  . Smoking status: Never Smoker   . Smokeless tobacco: Not on file  . Alcohol Use: No   patient employed Lives with spouse  OB History    Grav Para Term Preterm Abortions TAB SAB Ect Mult Living                  Review of Systems  All other systems reviewed and are negative.    Allergies  Dapsone and Sulfonamide derivatives  Home Medications   Current Outpatient Rx  Name Route Sig Dispense Refill  . AMLODIPINE BESYLATE 10 MG PO TABS Oral Take 10 mg by mouth daily.    . INSULIN ASPART 100 UNIT/ML [place] SOLN Subcutaneous Inject 2 Units into the skin 3 (three) times daily as needed. For high sugar    . LEVOTHYROXINE SODIUM 88 MCG PO TABS Oral Take 88 mcg by mouth daily.    Marland Kitchen MYCOPHENOLATE SODIUM 180 MG PO TBEC Oral Take 180 mg by mouth 3 (three) times daily.    Marland Kitchen PREDNISONE (PAK) 5 MG PO TABS Oral Take 5 mg by mouth daily.    Marland Kitchen TACROLIMUS 1 MG PO CAPS Oral Take 1-2 mg by mouth 2 (two) times daily. 2 mg in the am and 1mg  at night      BP 147/85  Pulse 67  Temp(Src) 98.5 F (36.9 C) (Oral)  Resp 16  SpO2 99%  15:26 Dr Chiquita Loth, states he will consult, have medicine admit  Physical Exam  Nursing note and vitals reviewed. Constitutional: She is oriented to person, place, and time. She appears well-developed and well-nourished.  Non-toxic appearance. She does not appear ill. No distress.  HENT:  Head: Normocephalic and atraumatic.  Right Ear: External ear normal.  Left Ear: External ear normal.  Nose: Nose normal. No mucosal edema or rhinorrhea.  Mouth/Throat: Oropharynx is clear and moist and mucous membranes are normal. No dental abscesses or uvula swelling.  Eyes: Conjunctivae and EOM are normal. Pupils are equal, round, and reactive to light.  Neck: Normal range of motion and full passive range of motion without pain. Neck supple.  Cardiovascular: Normal rate, regular rhythm and normal heart sounds.  Exam reveals no gallop and no friction rub.   No murmur  heard. Pulmonary/Chest: Effort normal and breath sounds normal. No respiratory distress. She has no wheezes. She has no rhonchi. She has no rales. She exhibits no tenderness and no crepitus.  Abdominal: Soft. Normal appearance and bowel sounds are normal. She exhibits no distension. There is no tenderness. There is no rebound and no guarding.  Musculoskeletal: Normal range of motion. She exhibits no edema and no tenderness.       Moves all extremities well.   Neurological: She is alert and oriented to person, place, and time. She has normal strength. No cranial nerve deficit.       Patient does not have pronator drift. Patient has mild weakness to grip strength on the left. She also has minor weakness in her left leg. Her reflexes are 3+ and equal diffusely including patellar and brachial radialis. Patient states she has mild loss of sensation in her left hand to light touch.  Skin: Skin is warm, dry and intact. No rash noted. No erythema. No pallor.  Psychiatric: She has a normal mood and affect. Her speech is normal and behavior is normal. Her mood appears not anxious.    ED Course  Procedures (including critical care time)  15:26 Dr Chiquita Loth, will consult, have medicine admit  15:44 Dr Marye Round, triad admit to tele, triad team 3   Results for orders placed during the hospital encounter of 11/29/11  CBC      Component Value Range   WBC 8.1  4.0 - 10.5 (K/uL)   RBC 3.86 (*) 3.87 - 5.11 (MIL/uL)   Hemoglobin 10.6 (*) 12.0 - 15.0 (g/dL)   HCT 33.8 (*) 36.0 - 46.0 (%)   MCV 87.6  78.0 - 100.0 (fL)   MCH 27.5  26.0 - 34.0 (pg)   MCHC 31.4  30.0 - 36.0 (g/dL)   RDW 14.1  11.5 - 15.5 (%)   Platelets 205  150 - 400 (K/uL)  DIFFERENTIAL      Component Value Range   Neutrophils Relative 92 (*) 43 - 77 (%)   Neutro Abs 7.5  1.7 - 7.7 (K/uL)   Lymphocytes Relative 5 (*) 12 - 46 (%)   Lymphs Abs 0.4 (*) 0.7 - 4.0 (K/uL)   Monocytes Relative 3  3 - 12 (%)   Monocytes Absolute 0.3  0.1 - 1.0  (K/uL)   Eosinophils Relative 0  0 - 5 (%)   Eosinophils Absolute 0.0  0.0 - 0.7 (K/uL)   Basophils Relative 0  0 - 1 (%)   Basophils Absolute 0.0  0.0 - 0.1 (K/uL)  COMPREHENSIVE METABOLIC PANEL      Component Value Range   Sodium  141  135 - 145 (mEq/L)   Potassium 4.4  3.5 - 5.1 (mEq/L)   Chloride 110  96 - 112 (mEq/L)   CO2 19  19 - 32 (mEq/L)   Glucose, Bld 139 (*) 70 - 99 (mg/dL)   BUN 41 (*) 6 - 23 (mg/dL)   Creatinine, Ser 3.12 (*) 0.50 - 1.10 (mg/dL)   Calcium 9.5  8.4 - 10.5 (mg/dL)   Total Protein 6.7  6.0 - 8.3 (g/dL)   Albumin 3.9  3.5 - 5.2 (g/dL)   AST 12  0 - 37 (U/L)   ALT 14  0 - 35 (U/L)   Alkaline Phosphatase 174 (*) 39 - 117 (U/L)   Total Bilirubin 0.2 (*) 0.3 - 1.2 (mg/dL)   GFR calc non Af Amer 16 (*) >90 (mL/min)   GFR calc Af Amer 18 (*) >90 (mL/min)  APTT      Component Value Range   aPTT 25  24 - 37 (seconds)  PROTIME-INR      Component Value Range   Prothrombin Time 15.0  11.6 - 15.2 (seconds)   INR 1.16  0.00 - 1.49   POCT I-STAT TROPONIN I      Component Value Range   Troponin i, poc 0.03  0.00 - 0.08 (ng/mL)   Comment 3            Laboratory interpretation all normal except renal insufficiency which is stable from last year, mild anemia    Mr Brain Wo Contrast  11/29/2011  *RADIOLOGY REPORT*  Clinical Data: Facial numbness.  Stroke 2010.  High blood pressure. Diabetes.  Kidney transplant.  MRI HEAD WITHOUT CONTRAST  Technique:  Multiplanar, multiecho pulse sequences of the brain and surrounding structures were obtained according to standard protocol without intravenous contrast.  Comparison: 12/09/2009.  Findings: Suggestion of tiny acute infarct at the right parietal - occipital lobe junction.  No intracranial hemorrhage.  Very mild white matter type changes may represent result of small vessel disease.  The patients remote left hemispheric infarct is not well delineated as encephalomalacia.  No hydrocephalus.  No intracranial mass lesion detected  on this unenhanced exam.  Major intracranial vascular structures are patent.  Minimal to mild partial opacification mastoid air cells and minimal paranasal sinus mucosal thickening.  Slight heterogeneous appearance bone marrow stable.  IMPRESSION: Suggestion of tiny acute infarct at the right parietal - occipital lobe junction.  Please see above.  Original Report Authenticated By: Doug Sou, M.D.    Date: 11/29/2011  Rate: 55  Rhythm: sinus tachycardia  QRS Axis: normal  Intervals: normal  ST/T Wave abnormalities: early repolarization  Conduction Disutrbances:none  Narrative Interpretation:   Old EKG Reviewed: unchanged from 07/08/2009    1. Stroke    Plan admission  Rolland Porter, MD, FACEP    MDM          Janice Norrie, MD 11/29/11 [phone]

## 2011-11-30 LAB — LIPID PANEL
Total CHOL/HDL Ratio: 2.7 RATIO
VLDL: 18 mg/dL (ref 0–40)

## 2011-11-30 LAB — GLUCOSE, CAPILLARY
Glucose-Capillary: 93 mg/dL (ref 70–99)
Glucose-Capillary: 94 mg/dL (ref 70–99)

## 2011-11-30 LAB — HEMOGLOBIN A1C: Mean Plasma Glucose: 117 mg/dL — ABNORMAL HIGH (ref <117)

## 2011-11-30 MED ORDER — ASPIRIN 81 MG PO CHEW
CHEWABLE_TABLET | ORAL | Status: AC
Start: 2011-11-30 — End: 2011-11-30
  Administered 2011-11-30: 81 mg
  Filled 2011-11-30: qty 1

## 2011-11-30 MED ORDER — ASPIRIN 81 MG PO CHEW
CHEWABLE_TABLET | ORAL | Status: AC
  Filled 2011-11-30: qty 1

## 2011-11-30 MED ORDER — STROKE: EARLY STAGES OF RECOVERY BOOK
Freq: Once | Status: AC
  Administered 2011-11-30: 16:00:00
  Filled 2011-11-30: qty 1

## 2011-11-30 MED ORDER — ASPIRIN EC 81 MG PO TBEC
81.0000 mg | DELAYED_RELEASE_TABLET | Freq: Every day | ORAL | Status: DC
  Administered 2011-11-30 – 2011-12-01 (×2): 81 mg via ORAL
  Filled 2011-11-30 (×2): qty 1

## 2011-11-30 MED ORDER — ASPIRIN 81 MG PO TBEC: 81.0000 mg | DELAYED_RELEASE_TABLET | Freq: Every day | ORAL | Status: AC

## 2011-11-30 NOTE — Evaluation (Signed)
Physical Therapy Evaluation Patient Details Name: Kimberly [name] MRN: [id number] DOB: 1956/03/30 Today's Date: 11/30/2011  Problem List:  Patient Active Problem List  Diagnoses  . HYPOTHYROIDISM  . ANEMIA  . HYPERTENSION  . CARDIAC ARRHYTHMIA  . ASTHMA  . GERD  . IGA NEPHROPATHY  . RENAL FAILURE  . COUGH  . Kidney transplanted  . IgA nephropathy  . Stroke  . DM type 2 causing complication    Past Medical History:  Past Medical History  Diagnosis Date  . Stroke   . Kidney transplanted   . Hypertension   . Diabetes mellitus   . IgA nephropathy   . Hypothyroidism   . Anemia   . GERD (gastroesophageal reflux disease)    Past Surgical History:  Past Surgical History  Procedure Date  . Kidney transplant   . Ureter revision     PT Assessment/Plan/Recommendation Clinical Impression Statement: Pt admitted with small Rt cerebral infarct (between parietal and occipital lobes) with no resulting impairments to her mobility.  Discussed increasing exercise as a way of reducing stroke risk and pt acknowledged she knows this, but has had a "rough year" medically and just has not done it.   PT Recommendation/Assessment: Patent does not need any further PT services No Skilled PT: Patient is independent with all acitivity/mobility PT Recommendation Follow Up Recommendations: No PT follow up Equipment Recommended: None recommended by PT PT Goals     PT Evaluation Precautions/Restrictions  Precautions Precaution Comments: none Prior Functioning  Home Living Lives With: Spouse;Son;Other (Comment) (dog) Type of Home: House Home Layout: Two level;Bed/bath upstairs;1/2 bath on main level Alternate Level Stairs-Rails: Right Alternate Level Stairs-Number of Steps: 16 Home Access: Stairs to enter Entrance Stairs-Rails: None Entrance Stairs-Number of Steps: 2 Bathroom Shower/Tub: Multimedia programmer: Standard Home Adaptive Equipment: None Prior Function Level of  Independence: Independent with basic ADLs;Independent with homemaking with ambulation;Independent with gait Vocation: Full time employment Comments: Engineer, materials Cognition Arousal/Alertness: Awake/alert Overall Cognitive Status: Appears within functional limits for tasks assessed Orientation Level: Oriented X4 Sensation/Coordination Sensation Light Touch: Impaired by gross assessment (Pt reports slight numbness and weakness of Lt hand) Coordination Gross Motor Movements are Fluid and Coordinated: Yes Extremity Assessment RLE Assessment RLE Assessment: Within Functional Limits LLE Assessment LLE Assessment: Within Functional Limits Mobility (including Balance) Bed Mobility Supine to Sit: 7: Independent Sit to Supine: 7: Independent Transfers Sit to Stand: 7: Independent Stand to Sit: 7: Independent Ambulation/Gait Ambulation/Gait: Yes Ambulation/Gait Assistance: 7: Independent Ambulation Distance (Feet): 300 Feet Assistive device: None Gait Pattern: Within Functional Limits Gait velocity: excellent Stairs: Yes Stairs Assistance: 7: Independent Stair Management Technique: No rails Number of Stairs: 2   Posture/Postural Control Posture/Postural Control: No significant limitations Balance Balance Assessed: Yes Dynamic Standing Balance Dynamic Standing - Balance Support: No upper extremity supported Dynamic Standing - Level of Assistance: 7: Independent Dynamic Gait Index Level Surface: Normal Change in Gait Speed: Normal Gait with Horizontal Head Turns: Normal Gait with Vertical Head Turns: Normal Gait and Pivot Turn: Normal Steps: Normal Exercise    End of Session PT - End of Session Activity Tolerance: Patient tolerated treatment well Patient left: in bed;with call bell in reach;with bed alarm set Nurse Communication: Mobility status for ambulation General Behavior During Session: [facility] for tasks performed  Ifeoma Vallin 11/30/2011, 10:16 AM  Pager  [phone]

## 2011-11-30 NOTE — Progress Notes (Signed)
  Echocardiogram 2D Echocardiogram has been performed.  Nuriya Stuck, Orlena Sheldon 11/30/2011, 3:46 PM

## 2011-11-30 NOTE — Progress Notes (Signed)
Subjective: Patient seen and examined, denies any complaints and was to go home.  Objective: Vital signs in last 24 hours: Temp:  [97.8 F (36.6 C)-98.8 F (37.1 C)] 98.4 F (36.9 C) (03/09 1341) Pulse Rate:  [60-69] 63  (03/09 1341) Resp:  [18-20] 20  (03/09 1341) BP: (119-156)/(71-91) 119/71 mmHg (03/09 1341) SpO2:  [96 %-98 %] 96 % (03/09 1341) Weight change:  Last BM Date: 11/28/11  Intake/Output from previous day:   Total I/O In: 360 [P.O.:360] Out: -    Physical Exam: General: Alert, awake, oriented x3, in no acute distress. HEENT: No bruits, no goiter. Heart: Regular rate and rhythm, without murmurs, rubs, gallops. Lungs: Clear to auscultation bilaterally. Abdomen: Soft, nontender, nondistended, positive bowel sounds. Extremities: No clubbing cyanosis or edema with positive pedal pulses. Neuro: Grossly intact, nonfocal.    Lab Results: Results for orders placed during the hospital encounter of 11/29/11 (from the past 24 hour(s))  GLUCOSE, CAPILLARY     Status: Normal   Collection Time   11/29/11 10:32 PM      Component Value Range   Glucose-Capillary 98  70 - 99 (mg/dL)   Comment 1 Documented in Chart     Comment 2 Notify RN    LIPID PANEL     Status: Normal   Collection Time   11/30/11 12:16 AM      Component Value Range   Cholesterol 148  0 - 200 (mg/dL)   Triglycerides 89  <150 (mg/dL)   HDL 54  >39 (mg/dL)   Total CHOL/HDL Ratio 2.7     VLDL 18  0 - 40 (mg/dL)   LDL Cholesterol 76  0 - 99 (mg/dL)  HEMOGLOBIN A1C     Status: Abnormal   Collection Time   11/30/11 12:30 AM      Component Value Range   Hemoglobin A1C 5.7 (*) <5.7 (%)   Mean Plasma Glucose 117 (*) <117 (mg/dL)  GLUCOSE, CAPILLARY     Status: Normal   Collection Time   11/30/11  6:59 AM      Component Value Range   Glucose-Capillary 93  70 - 99 (mg/dL)   Comment 1 Documented in Chart     Comment 2 Notify RN    GLUCOSE, CAPILLARY     Status: Normal   Collection Time   11/30/11 11:56 AM      Component Value Range   Glucose-Capillary 94  70 - 99 (mg/dL)   Comment 1 Documented in Chart     Comment 2 Notify RN    GLUCOSE, CAPILLARY     Status: Abnormal   Collection Time   11/30/11  4:32 PM      Component Value Range   Glucose-Capillary 149 (*) 70 - 99 (mg/dL)   Comment 1 Notify RN     Comment 2 Documented in Chart      Studies/Results: Mr Virgel Paling Wo Contrast  11/29/2011  *RADIOLOGY REPORT*  Clinical Data: Facial numbness.  MRA HEAD WITHOUT CONTRAST  Technique: Angiographic images of the Circle of Willis were obtained using MRA technique without intravenous contrast.  Comparison: 11/29/2011 MR brain. 12/09/2009 and 07/09/2009 MR angiogram circle of Willis.  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  Anterior circulation without medium or large size vessel significant stenosis or occlusion.  2 mm aneurysm along the superior margin of the distal M1 segment of the left middle cerebral artery (from which a vessel may arise) is unchanged.  Minimal bulge along the  inferior aspect of the distal M1 segment of the right middle cerebral artery (from which a vessel arises) and the slightly bulbous appearance of the right middle cerebral artery bifurcation appear stable.  Distal vertebral arteries and basilar artery without significant stenosis.  The right PICA is not imaged on the present exam.  Mild branch vessel irregularity.  At the skull base there is mild dilation of the left internal carotid artery compared to the right.  IMPRESSION: No significant change.  Please see above.  Original Report Authenticated By: Doug Sou, M.D.   Mr Brain Wo Contrast  11/29/2011  *RADIOLOGY REPORT*  Clinical Data: Facial numbness.  Stroke 2010.  High blood pressure. Diabetes.  Kidney transplant.  MRI HEAD WITHOUT CONTRAST  Technique:  Multiplanar, multiecho pulse sequences of the brain and surrounding structures were obtained according to standard protocol without intravenous  contrast.  Comparison: 12/09/2009.  Findings: Suggestion of tiny acute infarct at the right parietal - occipital lobe junction.  No intracranial hemorrhage.  Very mild white matter type changes may represent result of small vessel disease.  The patients remote left hemispheric infarct is not well delineated as encephalomalacia.  No hydrocephalus.  No intracranial mass lesion detected on this unenhanced exam.  Major intracranial vascular structures are patent.  Minimal to mild partial opacification mastoid air cells and minimal paranasal sinus mucosal thickening.  Slight heterogeneous appearance bone marrow stable.  IMPRESSION: Suggestion of tiny acute infarct at the right parietal - occipital lobe junction.  Please see above.  Original Report Authenticated By: Doug Sou, M.D.    Medications:    .  stroke: mapping our early stages of recovery book   Does not apply Once  . amLODipine  10 mg Oral Daily  . aspirin      . aspirin EC  81 mg Oral Daily  . insulin aspart  0-9 Units Subcutaneous TID WC  . levothyroxine  88 mcg Oral QAC breakfast  . mycophenolate  180 mg Oral TID  . predniSONE  5 mg Oral Daily  . tacrolimus  1 mg Oral QHS  . tacrolimus  2 mg Oral Daily  . DISCONTD: aspirin  300 mg Rectal Daily  . DISCONTD: aspirin  325 mg Oral Daily    senna-docusate     . sodium chloride 50 mL/hr at 11/29/11 2039    Assessment/Plan:  Principal Problem:  *Stroke Active Problems:  HYPOTHYROIDISM  ANEMIA  HYPERTENSION  Kidney transplanted  DM type 2 causing complication Plan: Continue aspirin. Follow 2-D echocardiogram and carotid Doppler results. Continue immunosuppressive therapy for kidney transplant, creatinine is around baseline. Continue amlodipine and Synthroid Will discharge to home when the results of the 2-D echocardiogram and carotid Dopplers are available.   LOS: 1 day   Kimberly [name] 11/30/2011, 6:43 PM

## 2011-11-30 NOTE — Progress Notes (Signed)
VASCULAR LAB PRELIMINARY  PRELIMINARY  PRELIMINARY  PRELIMINARY  Carotid duplex completed.    Preliminary report:  Bilateral:  No evidence of hemodynamically significant internal carotid artery stenosis.   Vertebral artery flow is antegrade.     Katye Valek D, RVS 11/30/2011, 11:43 AM

## 2011-11-30 NOTE — Progress Notes (Signed)
Speech Language Pathology ST received Cognitive-Linguistic evaluation order per stroke protocol. Brief screen completed reveals cognition and speech and language skills baseline.  No further ST needs in acute care setting. ST to sign off.  Sharman Crate M.S., CCC-SLP [phone] [facility] 11/30/2011, 5:00 PM

## 2011-11-30 NOTE — Progress Notes (Signed)
Order received, chart reviewed, into speak to pt. She reports that her LUE numbness/tingling has now gone. Did do a brief visual assessment of pursuits/saccades/visual fields without any deficits noted. Ask RN Caryl Pina) to please get pt a Stroke booklet. No acute OT needs identified, will sign off.  [place], [place] [phone] 11/30/2011

## 2011-11-30 NOTE — Discharge Summary (Addendum)
Patient ID: Kimberly [name] MRN: [id number] DOB/AGE: 30-Sep-1955 56 y.o.  Admit date: 11/29/2011 Discharge date: 11/30/2011  Primary Care Physician:  Reginia Naas, MD, MD   Discharge Diagnoses:    Present on Admission:  .Stroke .HYPOTHYROIDISM .ANEMIA .HYPERTENSION .Kidney transplanted .DM type 2 causing complication  Medication List  As of 11/30/2011 11:34 AM   TAKE these medications         amLODipine 10 MG tablet   Commonly known as: NORVASC   Take 10 mg by mouth daily.      aspirin 81 MG EC tablet   Take 1 tablet (81 mg total) by mouth daily.      insulin aspart 100 UNIT/ML injection   Commonly known as: novoLOG   Inject 2 Units into the skin 3 (three) times daily as needed. For high sugar      levothyroxine 88 MCG tablet   Commonly known as: SYNTHROID, LEVOTHROID   Take 88 mcg by mouth daily.      mycophenolate 180 MG EC tablet   Commonly known as: MYFORTIC   Take 180 mg by mouth 3 (three) times daily.      predniSONE 5 MG Tabs   Commonly known as: STERAPRED UNI-PAK   Take 5 mg by mouth daily.      tacrolimus 1 MG capsule   Commonly known as: PROGRAF   Take 1-2 mg by mouth 2 (two) times daily. 2 mg in the am and 1mg  at night             Consults:  Neurology   Significant Diagnostic Studies:  Mr Virgel Paling [id number] Contrast  11/29/2011  *RADIOLOGY REPORT*  Clinical Data: Facial numbness.  MRA HEAD WITHOUT CONTRAST  Technique: Angiographic images of the Circle of Willis were obtained using MRA technique without intravenous contrast.  Comparison: 11/29/2011 MR brain. 12/09/2009 and 07/09/2009 MR angiogram circle of Willis.  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  Anterior circulation without medium or large size vessel significant stenosis or occlusion.  2 mm aneurysm along the superior margin of the distal M1 segment of the left middle cerebral artery (from which a vessel may arise) is unchanged.  Minimal bulge along the inferior  aspect of the distal M1 segment of the right middle cerebral artery (from which a vessel arises) and the slightly bulbous appearance of the right middle cerebral artery bifurcation appear stable.  Distal vertebral arteries and basilar artery without significant stenosis.  The right PICA is not imaged on the present exam.  Mild branch vessel irregularity.  At the skull base there is mild dilation of the left internal carotid artery compared to the right.  IMPRESSION: No significant change.  Please see above.  Original Report Authenticated By: Doug Sou, M.D.   Mr Brain Wo Contrast  11/29/2011  *RADIOLOGY REPORT*  Clinical Data: Facial numbness.  Stroke 2010.  High blood pressure. Diabetes.  Kidney transplant.  MRI HEAD WITHOUT CONTRAST  Technique:  Multiplanar, multiecho pulse sequences of the brain and surrounding structures were obtained according to standard protocol without intravenous contrast.  Comparison: 12/09/2009.  Findings: Suggestion of tiny acute infarct at the right parietal - occipital lobe junction.  No intracranial hemorrhage.  Very mild white matter type changes may represent result of small vessel disease.  The patients remote left hemispheric infarct is not well delineated as encephalomalacia.  No hydrocephalus.  No intracranial mass lesion detected on this unenhanced exam.  Major intracranial vascular structures are patent.  Minimal to mild partial opacification mastoid air cells and minimal paranasal sinus mucosal thickening.  Slight heterogeneous appearance bone marrow stable.  IMPRESSION: Suggestion of tiny acute infarct at the right parietal - occipital lobe junction.  Please see above.  Original Report Authenticated By: Doug Sou, M.D.   Carotid duplex  Preliminary report: Bilateral: No evidence of hemodynamically significant internal carotid artery stenosis. Vertebral artery flow is antegrade.   Brief H and P: For complete details please refer to admission H and P, but  in brief  HONEST ANES is an 56 y.o. female with history stroke 2010 affecting right hand along with kidney failure and transplant 2011. Patient awoke this am WNL, at 7:30 she noted tingling in lefthand then left face. These symptoms came and went throughout the day which prompted her to come to ED. At present time here symptoms have resolved however her MRI did show a small left parietal-occipital lobe infarct. Currently she has no other symptoms,    Hospital Course:  The patient was admitted to the neurology floor, start workup was initiated neurology service was consulted. MRI brain showed tiny acute infarct in the right parietal-occipital junction, MRA brain showed 2 mm aneurysm in the left MCA unchanged from previous imaging, follow serial MRAs yearly as per neurology recommendations.. Patient patient was kept on aspirin, her symptoms had resolved. She was evaluated by PT and no acute mental followup recommended. Echocardiogram and carotid Dopplers . carotid Dopplers preliminary report showed no significant stenosis , echocardiogram showed no cardiac source of emboli , EF of 55-60%, discussed with Dr. Conley Canal from neurology who recommended no TEE and the patient will discharge. Patient has history of kidney transplant, her creatinine is at baseline and she was continued on her immunosuppressive regimen.  Subjective: Patient seen and examined today she reported resolution ofnumbness and denies any other complaints.  Filed Vitals:   11/30/11 1050  BP: 142/90  Pulse: 60  Temp: 98.8 F (37.1 C)  Resp: 20    General: Alert, awake, oriented x3, in no acute distress. Heart: Regular rate and rhythm, without murmurs, rubs, gallops. Lungs: Clear to auscultation bilaterally. Abdomen: Soft, nontender, nondistended, positive bowel sounds. Extremities: No clubbing cyanosis or edema with positive pedal pulses. Neuro: Grossly intact, nonfocal.   Disposition and Follow-up:  To home, follow his  PCP in one week and neurology in 2-4 weeks Follow with your  nephrologist as scheduled.   Time spent on Discharge: Approximately 45 minutes.   Signed: Ricka Westra 11/30/2011, 11:34 AM

## 2011-11-30 NOTE — Progress Notes (Signed)
History: Kimberly [name] is an 56 y.o. female with history stroke 2010 affecting right hand along with kidney failure and transplant 2011. Patient awoke this am WNL, at 7:30 she noted tingling in left hand then left face. These symptoms came and went throughout the day which prompted her to come to ED. At present time here symptoms have resolved however her MRI did show a small left parietal-occipital lobe infarct.   LSN: [id number] 11/29/11 tPA Given: No, rapidly resolved sx. Level of Consciousness (1a. ): Alert, keenly responsive (03/08 2000) LOC Questions (1b. ): Answers both questions correctly (03/08 2000) LOC Commands (1c. ): Performs both tasks correctly (03/08 2000) Best Gaze (2. ): Normal (03/08 2000) Visual (3. ): No visual loss (03/08 2000) Facial Palsy (4. ): Normal symmetrical movements (03/08 2000) Motor Arm, Left (5a. ): No drift (03/08 2000) Motor Arm, Right (5b. ): No drift (03/08 2000) Motor Leg, Left (6a. ): No drift (03/08 2000) Motor Leg, Right (6b. ): No drift (03/08 2000) Limb Ataxia (7. ): Absent (03/08 2000) Sensory (8. ): Mild-to-moderate sensory loss, patient feels pinprick is less sharp or is dull on the affected side, or there is a loss of superficial pain with pinprick, but patient is aware of being touched (03/08 2000) Best Language (9. ): No aphasia (03/08 2000) Dysarthria (10. ): Normal (03/08 2000) Inattention/Extinction: No Abnormality (03/08 2000) Total: 1  (03/08 2000)  Subjective: I feel fine.  No numbness or weakness.  No HA, VA change or swallowing difficulty. No extremity weakness.  Objective: BP 140/90  Pulse 61  Temp(Src) 98.1 F (36.7 C) (Oral)  Resp 18  Ht 5\' 6"  (1.676 m)  Wt 68 kg (149 lb 14.6 oz)  BMI 24.20 kg/m2  SpO2 98% Telemetry:  CBGs  Basename 11/30/11 0659 11/29/11 2232  GLUCAP 93 98    Diet: CHO modified, thin  Activity: up as tolerated  DVT Prophylaxis:    Medications: Scheduled:   . amLODipine  10 mg Oral Daily  .  aspirin  300 mg Rectal Daily   Or  . aspirin  325 mg Oral Daily  . insulin aspart  0-9 Units Subcutaneous TID WC  . levothyroxine  88 mcg Oral QAC breakfast  . mycophenolate  180 mg Oral TID  . predniSONE  5 mg Oral Daily  . tacrolimus  1 mg Oral QHS  . tacrolimus  2 mg Oral Daily  . DISCONTD: aspirin EC  325 mg Oral Daily  . DISCONTD: enoxaparin  40 mg Subcutaneous Q24H    Neurologic Exam: Mental Status: Alert, oriented, thought content appropriate.  Speech fluent without evidence of aphasia. Able to follow 3 step commands without difficulty. Cranial Nerves: II- Visual fields grossly intact. III/IV/VI-Extraocular movements intact.  Pupils reactive bilaterally. V/VII-Smile symmetric VIII-hearing grossly intact IX/X-normal gag XI-bilateral shoulder shrug XII-midline tongue extension Motor: 5/5 bilaterally with normal tone and bulk Sensory: Light touch intact throughout, bilaterally Deep Tendon Reflexes: 2+ and symmetric throughout Plantars: Downgoing bilaterally Cerebellar: Normal finger-to-nose, normal rapid alternating movements and normal heel-to-shin test.   Lab Results: Basic Metabolic Panel:  Lab [id number] 1328  NA 141  K 4.4  CL 110  CO2 19  GLUCOSE 139*  BUN 41*  CREATININE 3.12*  CALCIUM 9.5  MG --  PHOS --   Liver Function Tests:  Lab 11/29/11 1328  AST 12  ALT 14  ALKPHOS 174*  BILITOT 0.2*  PROT 6.7  ALBUMIN 3.9   CBC:  Lab 11/29/11 1328  WBC 8.1  NEUTROABS 7.5  HGB 10.6*  HCT 33.8*  MCV 87.6  PLT 205   CBG:  Lab 11/30/11 0659 11/29/11 2232  GLUCAP 93 98   Hemoglobin A1C:  Lab 11/29/11 1328  HGBA1C 5.7*   Fasting Lipid Panel:  Lab 11/30/11 0016  CHOL 148  HDL 54  LDLCALC 76  TRIG 89  CHOLHDL 2.7  LDLDIRECT --   Coagulation:  Lab 11/29/11 1328  LABPROT 15.0  INR 1.16   Study Results: 11/29/2011  MRA HEAD WITHOUT CONTRAST    Findings: Aplastic A1 segment of the right anterior cerebral artery.  Anterior circulation  without medium or large size vessel significant stenosis or occlusion.  2 mm aneurysm along the superior margin of the distal M1 segment of the left middle cerebral artery (from which a vessel may arise) is unchanged.  Minimal bulge along the inferior aspect of the distal M1 segment of the right middle cerebral artery (from which a vessel arises) and the slightly bulbous appearance of the right middle cerebral artery bifurcation appear stable.  Distal vertebral arteries and basilar artery without significant stenosis.  The right PICA is not imaged on the present exam.  Mild branch vessel irregularity.  At the skull base there is mild dilation of the left internal carotid artery compared to the right.  IMPRESSION: No significant change. Doug Sou, M.D.   11/29/2011  MRI HEAD WITHOUT CONTRAST  Findings: Suggestion of tiny acute infarct at the right parietal - occipital lobe junction.  No intracranial hemorrhage.  Very mild white matter type changes may represent result of small vessel disease.  The patients remote left hemispheric infarct is not well delineated as encephalomalacia.  No hydrocephalus.  No intracranial mass lesion detected on this unenhanced exam.  Major intracranial vascular structures are patent.  Minimal to mild partial opacification mastoid air cells and minimal paranasal sinus mucosal thickening.  Slight heterogeneous appearance bone marrow stable.  IMPRESSION: Suggestion of tiny acute infarct at the right parietal - occipital lobe junction.  Doug Sou, M.D.   2D ECHO, Carotid dopplers: pending  Therapies: pending  Assessment: 56 yo female with 1. Suggestion of tiny acute infarct at the right parietal - occipital lobe junction.- asymptomatic presently.  MRA unremarkable/ unchanged from prior. No localizing deficits.  2D ECHO, Carotid dopplers: pending.  ASA 81 mg daily for secondary stroke prevention. 2. DM2- controlled on current regimen.  A1C 5.7, at goal. LDL 76 @ goal. 3.  CKD/IgA nephropathy, s/p transplant 2011  Plan: 1. Echo and Carotid; if studies are WNL, neuro will s/o. 2. Change ASA from 325 to 81 mg daily for secondary stroke prevention.   3. Apply SCD's. 4. Will likely be able to go home later today- 5. Follow up with Dr. Leonie Man in 2 months.  No OP therapies required.   LOS: 1 day   Geraldine Solar [facility] [id number] 11/30/2011  8:31 AM  I have seen and examined this patient and agree with the findings and plan noted above. Caffie Damme, MD [facility] 11/30/2011 12:15 PM

## 2011-12-01 LAB — GLUCOSE, CAPILLARY: Glucose-Capillary: 79 mg/dL (ref 70–99)

## 2011-12-02 NOTE — Progress Notes (Signed)
Utilization Review Completed.Donne Anon T3/07/2012

## 2011-12-17 ENCOUNTER — Encounter: Payer: Self-pay | Admitting: Cardiovascular Disease

## 2012-01-17 ENCOUNTER — Ambulatory Visit (INDEPENDENT_AMBULATORY_CARE_PROVIDER_SITE_OTHER): Payer: BC Managed Care – PPO | Admitting: Internal Medicine

## 2012-01-17 ENCOUNTER — Encounter: Payer: Self-pay | Admitting: Internal Medicine

## 2012-01-17 VITALS — BP 141/83 | HR 72 | Resp 18 | Ht 66.0 in | Wt 150.0 lb

## 2012-01-17 DIAGNOSIS — I493 Ventricular premature depolarization: Secondary | ICD-10-CM

## 2012-01-17 DIAGNOSIS — I4949 Other premature depolarization: Secondary | ICD-10-CM

## 2012-01-17 NOTE — Assessment & Plan Note (Signed)
Pts records from [facility] were extensively reviewed.  She has documented PVCs which are the likely cause for her palpitations.  She has no evidence of structural heart disease.  I would therefore not recommend changes to her medicines right now.  Her PVC burden on the holter monitor was very low.  She also had first degree AV block and bradycardia in the setting of her GI illness in January.  This appears to have resolved.  Today, her PR interval is 158msec.  No further workup is planned.  I have reassured the patient today. She will follow-up with me as needed.

## 2012-01-17 NOTE — Progress Notes (Signed)
Primary Care Physician: Reginia Naas, MD, MD Referring Physician: Dr Tamala Julian Primary Nephrologist:  Dr Azzie Glatter ([facility])  Kimberly [name] is a 56 y.o. female with a h/o IgA nephropathy s/p renal transplant at [facility] 2011 with HTN, DM, and CVA who presents today for cardiology consultation.  She reports that in January, she was hospitalized at [facility] for diarrhea related to CellCept.  While she was in the hospital, she was observed to have bradycardia.  This was in the setting of colon prep for colonoscopy/ diarrhea, and medical illness.  She was told that she had AV block.  She had a stress echo which was normal.  She also wore an event monitor which revealed sinus rhythm with PACs and PVCs.  She had a first degree AV block.  There were no prolonged pauses.  No further workup was advised. Since that time, she has done quite well.  She has occasional palpitations.  She describes these as "skipped beats".  These occur several times per day but last only a few seconds.  She does not find these to be very worrisome.  She denies chest pain, shortness of breath, orthopnea, PND, dizziness, presyncope, syncope, or neurologic sequela. She has had BLE edema for several months after prolonged sitting.   Her energy is fair.  She is not physically very active.  The patient is tolerating medications without difficulties and is otherwise without complaint today.   Past Medical History  Diagnosis Date  . Stroke October 2010, March 2013  . Kidney transplanted 01/2010    [facility], has had some rejection  . Hypertension   . Diabetes mellitus   . IgA nephropathy     perviously on peritoneal dialysis  . Hypothyroidism   . Anemia   . GERD (gastroesophageal reflux disease)   . Immunosuppression   . First degree AV block   . Premature atrial beat   . Premature ventricular beat   . Shingles 03/2011   Past Surgical History  Procedure Date  . Kidney transplant 5/11    [facility]  . Ureter revision 8/12  . Hernia repair      Current Outpatient Prescriptions  Medication Sig Dispense Refill  . amLODipine (NORVASC) 10 MG tablet Take 10 mg by mouth daily.      Marland Kitchen aspirin EC 81 MG EC tablet Take 1 tablet (81 mg total) by mouth daily.  30 tablet  0  . insulin regular (NOVOLIN R,HUMULIN R) 100 units/mL injection Inject into the skin 3 (three) times daily before meals.      Marland Kitchen levothyroxine (SYNTHROID, LEVOTHROID) 88 MCG tablet Take 88 mcg by mouth daily.      . mycophenolate (MYFORTIC) 180 MG EC tablet Take 180 mg by mouth 3 (three) times daily.      . predniSONE (STERAPRED UNI-PAK) 5 MG TABS Take 5 mg by mouth daily.      . tacrolimus (PROGRAF) 1 MG capsule Take 1-2 mg by mouth 2 (two) times daily. 2 mg in the am and 1mg  at night        Allergies  Allergen Reactions  . Dapsone Rash    Pain in feet  . Lyrica Rash    Face rash;red.  . Sulfonamide Derivatives Rash    History   Social History  . Marital Status: Married    Spouse Name: N/A    Number of Children: N/A  . Years of Education: N/A   Occupational History  . Not on file.   Social History Main Topics  .  Smoking status: Never Smoker   . Smokeless tobacco: Never Used  . Alcohol Use: No  . Drug Use: No  . Sexually Active: Yes    Birth Control/ Protection: Post-menopausal   Other Topics Concern  . Not on file   Social History Narrative   Pt lives in [place].  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    Family History  Problem Relation Age of Onset  . Coronary artery disease Mother     ROS- All systems are reviewed and negative except as per the HPI above  Physical Exam: Filed Vitals:   01/17/12 1056  BP: 141/83  Pulse: 72  Resp: 18  Height: 5\' 6"  (1.676 m)  Weight: 150 lb (68.04 kg)    GEN- The patient is well appearing, alert and oriented x 3 today.   Head- normocephalic, atraumatic Eyes-  Sclera clear, conjunctiva pink Ears- hearing intact Oropharynx- clear Neck- supple, no JVP Lymph- no  cervical lymphadenopathy Lungs- Clear to ausculation bilaterally, normal work of breathing Heart- Regular rate and rhythm, no murmurs, rubs or gallops, PMI not laterally displaced GI- soft, NT, ND, + BS Extremities- no clubbing, cyanosis, or edema  EKG today reveals sinus rhythm 74 bpm, PR 152, normal ekg  Assessment and Plan:

## 2012-01-17 NOTE — Patient Instructions (Signed)
Your physician recommends that you schedule a follow-up appointment as needed  

## 2012-03-13 DIAGNOSIS — I639 Cerebral infarction, unspecified: Secondary | ICD-10-CM | POA: Insufficient documentation

## 2012-03-13 DIAGNOSIS — J45909 Unspecified asthma, uncomplicated: Secondary | ICD-10-CM | POA: Insufficient documentation

## 2012-03-13 DIAGNOSIS — R911 Solitary pulmonary nodule: Secondary | ICD-10-CM | POA: Insufficient documentation

## 2012-03-13 DIAGNOSIS — E559 Vitamin D deficiency, unspecified: Secondary | ICD-10-CM | POA: Insufficient documentation

## 2012-07-29 ENCOUNTER — Other Ambulatory Visit: Payer: Self-pay | Admitting: Family Medicine

## 2012-07-29 DIAGNOSIS — R110 Nausea: Secondary | ICD-10-CM

## 2012-07-29 DIAGNOSIS — R109 Unspecified abdominal pain: Secondary | ICD-10-CM

## 2012-07-30 ENCOUNTER — Ambulatory Visit
Admission: RE | Admit: 2012-07-30 | Discharge: 2012-07-30 | Disposition: A | Discharge status: Home / Self Care | Payer: BC Managed Care – PPO | Source: Ambulatory Visit | Attending: Family Medicine | Admitting: Family Medicine

## 2012-07-30 DIAGNOSIS — R110 Nausea: Secondary | ICD-10-CM

## 2012-07-30 DIAGNOSIS — R109 Unspecified abdominal pain: Secondary | ICD-10-CM

## 2012-08-10 DIAGNOSIS — N390 Urinary tract infection, site not specified: Secondary | ICD-10-CM | POA: Insufficient documentation

## 2012-08-10 DIAGNOSIS — R197 Diarrhea, unspecified: Secondary | ICD-10-CM | POA: Insufficient documentation

## 2012-10-28 DIAGNOSIS — N186 End stage renal disease: Secondary | ICD-10-CM | POA: Insufficient documentation

## 2012-12-22 ENCOUNTER — Other Ambulatory Visit: Payer: Self-pay | Admitting: Family Medicine

## 2012-12-22 DIAGNOSIS — I729 Aneurysm of unspecified site: Secondary | ICD-10-CM

## 2012-12-28 ENCOUNTER — Ambulatory Visit
Admission: RE | Admit: 2012-12-28 | Discharge: 2012-12-28 | Disposition: A | Discharge status: Home / Self Care | Payer: BC Managed Care – PPO | Source: Ambulatory Visit | Attending: Family Medicine | Admitting: Family Medicine

## 2012-12-28 DIAGNOSIS — I729 Aneurysm of unspecified site: Secondary | ICD-10-CM

## 2013-04-15 ENCOUNTER — Other Ambulatory Visit (INDEPENDENT_AMBULATORY_CARE_PROVIDER_SITE_OTHER): Payer: BC Managed Care – PPO

## 2013-04-15 ENCOUNTER — Telehealth: Payer: Self-pay | Admitting: *Deleted

## 2013-04-15 ENCOUNTER — Other Ambulatory Visit: Payer: Self-pay | Admitting: Endocrinology

## 2013-04-15 DIAGNOSIS — E039 Hypothyroidism, unspecified: Secondary | ICD-10-CM

## 2013-04-15 DIAGNOSIS — N19 Unspecified kidney failure: Secondary | ICD-10-CM

## 2013-04-15 DIAGNOSIS — E119 Type 2 diabetes mellitus without complications: Secondary | ICD-10-CM

## 2013-04-15 LAB — BASIC METABOLIC PANEL
CO2: 17 mEq/L — ABNORMAL LOW (ref 19–32)
Calcium: 9.4 mg/dL (ref 8.4–10.5)
Chloride: 115 mEq/L — ABNORMAL HIGH (ref 96–112)
Glucose, Bld: 83 mg/dL (ref 70–99)
Sodium: 139 mEq/L (ref 135–145)

## 2013-04-15 LAB — HEMOGLOBIN A1C: Hgb A1c MFr Bld: 5.1 % (ref 4.6–6.5)

## 2013-04-15 NOTE — Telephone Encounter (Signed)
She has chronic renal failure, expected

## 2013-04-15 NOTE — Telephone Encounter (Signed)
noted 

## 2013-04-15 NOTE — Telephone Encounter (Signed)
Lab called to say patient has Critical labs,  Creatine is 4.49, GFR is 10.75, Bun is 64

## 2013-04-16 LAB — PARATHYROID HORMONE, INTACT (NO CA): PTH: 86 pg/mL — ABNORMAL HIGH (ref 15–65)

## 2013-04-20 ENCOUNTER — Ambulatory Visit (INDEPENDENT_AMBULATORY_CARE_PROVIDER_SITE_OTHER): Payer: BC Managed Care – PPO | Admitting: Endocrinology

## 2013-04-20 ENCOUNTER — Encounter: Payer: Self-pay | Admitting: Endocrinology

## 2013-04-20 VITALS — BP 128/64 | HR 69 | Temp 98.3°F | Resp 10 | Ht 65.75 in | Wt 115.3 lb

## 2013-04-20 DIAGNOSIS — E118 Type 2 diabetes mellitus with unspecified complications: Secondary | ICD-10-CM

## 2013-04-20 DIAGNOSIS — N2581 Secondary hyperparathyroidism of renal origin: Secondary | ICD-10-CM | POA: Insufficient documentation

## 2013-04-20 DIAGNOSIS — E039 Hypothyroidism, unspecified: Secondary | ICD-10-CM

## 2013-04-20 NOTE — Patient Instructions (Addendum)
Calcitriol only 1 a day Mon thru Fri Continue calcium supplements and vitamin D QTC  More blood glucose monitoring 2 hours after meals, bring monitor to each visit Increase exercise as tolerated  No change in thought supplements

## 2013-04-20 NOTE — Progress Notes (Signed)
Patient ID: Kimberly [name], female   DOB: [birth year], 56 y.o.   MRN: [id number]  Reason for Appointment: Diabetes follow-up   History of Present Illness   Diabetes:   Diagnosis: Secondary diabetes mellitus which started after she got large doses of steroids in 2012 for her transplant rejection.  Her insulin had been stopped well-nourished he words were tapered down and she is on Tradjenta which appears to control blood sugar fairly well. Currently only on 5 mg prednisone.  Does monitor postprandial readings at times also. She does not exercise regularly but usually follows a good diet.   Her sugars appear to be fairly well-controlled with no recent high readings, however has not brought her monitor for review today and not clear how often she is checking. Monitors blood glucose: < Once a day.  Glucometer: One Touch.  Blood Glucose readings: Blood sugars <150  did not bring any records today.  Meals: bagel for Breakfast, smaller portions now with decreased appetite.   Physical activity: exercise: some walking, limited by fatigue.   Dietician visit: Never. she thinks she gets some protein with chicken, legumes and beans. .   The last HbgA1c was 5.3 and is now 5.1, probably affected by renal function and RBC turn over.   Renal Insufficiency:  She has had renal insufficiency from transplant rejection. This is being followed regularly at [facility]. Her Creatinine is still relatively high at 4.5  She has had significant secondary hyperparathyroidism She has been taking calcitriol 0.5, 10 tablets a week since her last visit because of hyperparathyroidism. Highest PTH 570 on 5 /2/13. Her PTH on the last visit was 121 and is now 75 with normal calcium again. Her calcium level has been normal in the past also . Also was found to have vitamin D deficiency and is taking OTC supplementation.  No history of bone pain, kidney stones or fractures. She thinks she had osteopenia with the bone density that was  done previously.  Hypothyroidism:   The hypothyroidism was first diagnosed 10 years ago. Complaints are reported by the patient are only Mild fatigue  The hypothyroidism presented with symptoms of coldness and lethargy which resolved with treatment.    The treatments that the patient has taken include Synthroid and had been on the 100 mcg dose for a few years. This was reduced to 86mcg in 10/13 when TSH was low and is now on 56mcg. Also previously had taken Armour Thyroid  .  The last TSH was performed On 10/14/12 and was 1.27, now 0.7.     Appointment on 04/15/2013  Component Date Value Range Status  . Sodium 04/15/2013 139  135 - 145 mEq/L Final  . Potassium 04/15/2013 4.3  3.5 - 5.1 mEq/L Final  . Chloride 04/15/2013 115* 96 - 112 mEq/L Final  . CO2 04/15/2013 17* 19 - 32 mEq/L Final  . Glucose, Bld 04/15/2013 83  70 - 99 mg/dL Final  . BUN 04/15/2013 64* 6 - 23 mg/dL Final  . Creatinine, Ser 04/15/2013 4.5* 0.4 - 1.2 mg/dL Final  . Calcium 04/15/2013 9.4  8.4 - 10.5 mg/dL Final  . GFR 04/15/2013 10.76* >60.00 mL/min Final  . TSH 04/15/2013 0.70  0.35 - 5.50 uIU/mL Final  . Free T4 04/15/2013 1.01  0.60 - 1.60 ng/dL Final  . Hemoglobin A1C 04/15/2013 5.1  4.6 - 6.5 % Final   Glycemic Control Guidelines for People with Diabetes:Non Diabetic:  <6%Goal of Therapy: <7%Additional Action Suggested:  >8%   . PTH  04/15/2013 86* 15 - 65 pg/mL Final      Medication List       This list is accurate as of: 04/20/13  9:22 AM.  Always use your most recent med list.               amLODipine 10 MG tablet  Commonly known as:  NORVASC  Take 10 mg by mouth daily.     aspirin 81 MG tablet  Take 81 mg by mouth daily.     calcitRIOL 0.5 MCG capsule  Commonly known as:  ROCALTROL  0.5 mcg.     cholecalciferol 1000 UNITS tablet  Commonly known as:  VITAMIN D  Take 2,000 Units by mouth daily.     insulin regular 100 units/mL injection  Commonly known as:  NOVOLIN R,HUMULIN R  Inject  into the skin 3 (three) times daily before meals.     levothyroxine 88 MCG tablet  Commonly known as:  SYNTHROID, LEVOTHROID  Take 75 mcg by mouth daily. Takes name Brand Synthroid     magnesium 30 MG tablet  Take 250 mg by mouth 2 (two) times daily.     mycophenolate 180 MG EC tablet  Commonly known as:  MYFORTIC  Take 180 mg by mouth 3 (three) times daily.     predniSONE 5 MG Tabs  Commonly known as:  STERAPRED UNI-PAK  Take 5 mg by mouth daily.     tacrolimus 1 MG capsule  Commonly known as:  PROGRAF  Take 1-2 mg by mouth 2 (two) times daily. 2 mg in the am and 1mg  at night     TRADJENTA 5 MG Tabs tablet  Generic drug:  linagliptin  5 mg.        Allergies:  Allergies  Allergen Reactions  . Dapsone Rash    Pain in feet  . Pregabalin Rash    Face rash;red.  . Sulfonamide Derivatives Rash    Past Medical History  Diagnosis Date  . Stroke October 2010, March 2013  . Kidney transplanted 01/2010    [facility], has had some rejection  . Hypertension   . Diabetes mellitus   . IgA nephropathy     perviously on peritoneal dialysis  . Hypothyroidism   . Anemia   . GERD (gastroesophageal reflux disease)   . Immunosuppression   . First degree AV block   . Premature atrial beat   . Premature ventricular beat   . Shingles 03/2011    Past Surgical History  Procedure Laterality Date  . Kidney transplant  5/11    [facility]  . Ureter revision  8/12  . Hernia repair      Family History  Problem Relation Age of Onset  . Coronary artery disease Mother     Social History:  reports that she has never smoked. She has never used smokeless tobacco. She reports that she does not drink alcohol or use illicit drugs.  Review of Systems - Cardiovascular ROS: no recent hypertension; no history of CVA or MI No vomiting Appetite is fair No recent pedal edema   Examination:   BP 128/64  Pulse 69  Temp(Src) 98.3 F (36.8 C)  Resp 10  Ht 5' 5.75" (1.67 m)  Wt 115 lb 4.8 oz (52.3  kg)  BMI 18.75 kg/m2  SpO2 99%  Body mass index is 18.75 kg/(m^2).   She did not look pale No thyroid enlargement felt Biceps reflexes are normal No pedal edema or facial puffiness  Assesment/Plan:  1. Hyperparathyroidism due to renal insufficiency - 588.81 (Primary), This is very well controlled now with calcitriol and PTH is relatively low at 86 for her level of renal function. She will reduce her calcitriol to have the weekly dosage. Her PTH should be about 150 Also potassium is normal as also blood pressure    2. Hypothyroidism - 244.9, TSH is normal and she will continue the same dose of 88 mcg Synthroidpatient has been quite compliant with this   3. History of vitamin D deficiency   4. Steroid-induced diabetes - 249.00, Appears well controlled. Since she is only on 5 mg prednisone probably has underlying type 2 diabetes also. Although better and maybe high normal at times she is not monitoring enough and discussed needing to check fasting ordinary with postprandial readings as well as increasing exercise as tolerated She will continue Tradjenta    Stesha Neyens 04/20/2013, 9:22 AM

## 2013-07-23 ENCOUNTER — Other Ambulatory Visit: Payer: Self-pay | Admitting: *Deleted

## 2013-07-23 MED ORDER — CALCITRIOL 0.5 MCG PO CAPS: ORAL_CAPSULE | ORAL | Status: DC

## 2013-08-18 LAB — HM DIABETES EYE EXAM

## 2013-08-23 ENCOUNTER — Encounter: Payer: Self-pay | Admitting: Endocrinology

## 2013-10-12 ENCOUNTER — Other Ambulatory Visit: Payer: BC Managed Care – PPO

## 2013-10-18 ENCOUNTER — Other Ambulatory Visit (INDEPENDENT_AMBULATORY_CARE_PROVIDER_SITE_OTHER): Payer: BC Managed Care – PPO

## 2013-10-18 ENCOUNTER — Encounter (INDEPENDENT_AMBULATORY_CARE_PROVIDER_SITE_OTHER): Payer: Self-pay

## 2013-10-18 DIAGNOSIS — E039 Hypothyroidism, unspecified: Secondary | ICD-10-CM

## 2013-10-18 DIAGNOSIS — N2581 Secondary hyperparathyroidism of renal origin: Secondary | ICD-10-CM

## 2013-10-18 DIAGNOSIS — E118 Type 2 diabetes mellitus with unspecified complications: Secondary | ICD-10-CM

## 2013-10-18 LAB — T4, FREE: Free T4: 0.85 ng/dL (ref 0.60–1.60)

## 2013-10-18 LAB — TSH: TSH: 0.92 u[IU]/mL (ref 0.35–5.50)

## 2013-10-19 ENCOUNTER — Ambulatory Visit: Payer: BC Managed Care – PPO

## 2013-10-19 ENCOUNTER — Ambulatory Visit: Payer: BC Managed Care – PPO | Admitting: Endocrinology

## 2013-10-19 DIAGNOSIS — E119 Type 2 diabetes mellitus without complications: Secondary | ICD-10-CM

## 2013-10-19 LAB — BASIC METABOLIC PANEL
BUN: 79 mg/dL — ABNORMAL HIGH (ref 6–23)
CHLORIDE: 111 meq/L (ref 96–112)
CO2: 13 meq/L — AB (ref 19–32)
Calcium: 8.6 mg/dL (ref 8.4–10.5)
Creatinine, Ser: 7.1 mg/dL (ref 0.4–1.2)
GFR: 6.29 mL/min — CL (ref >60.00)
Glucose, Bld: 85 mg/dL (ref 70–99)
POTASSIUM: 3.8 meq/L (ref 3.5–5.1)
Sodium: 139 mEq/L (ref 135–145)

## 2013-10-19 LAB — PARATHYROID HORMONE, INTACT (NO CA): PTH: 251 pg/mL — AB (ref 15–65)

## 2013-10-19 LAB — FRUCTOSAMINE: Fructosamine: 264 umol/L (ref <285)

## 2013-10-26 ENCOUNTER — Ambulatory Visit: Payer: BC Managed Care – PPO | Admitting: Endocrinology

## 2013-11-02 ENCOUNTER — Encounter: Payer: Self-pay | Admitting: Endocrinology

## 2013-11-02 ENCOUNTER — Ambulatory Visit (INDEPENDENT_AMBULATORY_CARE_PROVIDER_SITE_OTHER): Payer: BC Managed Care – PPO | Admitting: Endocrinology

## 2013-11-02 VITALS — BP 122/76 | HR 73 | Temp 97.6°F | Resp 16 | Ht 66.0 in | Wt 104.4 lb

## 2013-11-02 DIAGNOSIS — N2581 Secondary hyperparathyroidism of renal origin: Secondary | ICD-10-CM

## 2013-11-02 DIAGNOSIS — E039 Hypothyroidism, unspecified: Secondary | ICD-10-CM

## 2013-11-02 DIAGNOSIS — E118 Type 2 diabetes mellitus with unspecified complications: Secondary | ICD-10-CM

## 2013-11-02 DIAGNOSIS — N185 Chronic kidney disease, stage 5: Secondary | ICD-10-CM

## 2013-11-02 NOTE — Patient Instructions (Signed)
Calcitriol daily

## 2013-11-02 NOTE — Progress Notes (Signed)
Patient ID: Kimberly [name], female   DOB: August 06, 1956, 58 y.o.   MRN: [id number]  Reason for Appointment: Diabetes follow-up   History of Present Illness   Diabetes:   Diagnosis: Her diabetes mellitus  started after she got large doses of steroids in 2012 for her transplant rejection.  Her insulin had been stopped when her blood sugars stabilized on lower doses of steroids. Subsequently started on Tradjenta which appears to control blood sugar fairly well. Currently only on 5 mg prednisone.  HbgA1c previously has been 5.1- 5.3  probably affected by renal function and RBC turn over.  Recent history: Her sugars appear to be fairly well-controlled with no recent high readings, however has been checking blood sugar somewhat sporadically and none after supper not brought her monitor for review today and not clear how often she is checking. Monitors blood glucose: < Once a day.  Glucometer: One Touch.  Blood Glucose readings: Blood sugars range from 105-146, with some readings in the mornings, midday and afternoon  Meals: bagel for Breakfast, smaller portions usually with decreased appetite.   Physical activity: exercise: Very little recently limited by fatigue.  Dietician visit: Never. she thinks she gets some protein with chicken, legumes and beans.  .  Wt Readings from Last 3 Encounters:  11/02/13 104 lb 6.4 oz (47.356 kg)  04/20/13 115 lb 4.8 oz (52.3 kg)  01/17/12 150 lb (68.04 kg)   Lab Results  Component Value Date   HGBA1C 5.1 04/15/2013   HGBA1C 5.7* 11/30/2011   HGBA1C 5.7* 11/29/2011   Lab Results  Component Value Date   LDLCALC 76 11/30/2011   CREATININE 7.1* 10/19/2013    Renal Insufficiency:  She has had renal insufficiency from transplant rejection. [facility]  [facility]. Her Creatinine is much higher on this visit, previously 4.5  She has had significant secondary hyperparathyroidism She has been taking calcitriol 0.5, 5 tablets a week since her last visit  because of hyperparathyroidism. Highest PTH 570 on 5 /2/13. Her PTH on the last visit was 86 with normal calcium.  Her PTH is relatively higher but her creatinine is increased. Also calcium level is low normal Also was found to have vitamin D deficiency and is taking OTC supplementation.  No history of bone pain, kidney stones or fractures. She thinks she had osteopenia with the bone density that was done previously.  Hypothyroidism:   The hypothyroidism was first diagnosed 10 years ago. Complaints are reported by the patient are only Mild fatigue, had lost weight for other reasons   The hypothyroidism presented with symptoms of coldness and lethargy which resolved with treatment.    The treatments that the patient has taken include Synthroid and had been on the 100 mcg dose for a few years. This was reduced to 30mcg in 10/13 when TSH was low and is now on ? 50 mcg . Also previously had taken Armour Thyroid   .  Lab Results  Component Value Date   TSH 0.92 10/18/2013       Clinical Support on 10/19/2013  Component Date Value Range Status  . Sodium 10/19/2013 139  135 - 145 mEq/L Final  . Potassium 10/19/2013 3.8  3.5 - 5.1 mEq/L Final  . Chloride 10/19/2013 111  96 - 112 mEq/L Final  . CO2 10/19/2013 13* 19 - 32 mEq/L Final  . Glucose, Bld 10/19/2013 85  70 - 99 mg/dL Final  . BUN 10/19/2013 79* 6 - 23 mg/dL Final  . Creatinine, Ser  10/19/2013 7.1* 0.4 - 1.2 mg/dL Final  . Calcium 10/19/2013 8.6  8.4 - 10.5 mg/dL Final  . GFR 10/19/2013 6.29* >60.00 mL/min Final  Appointment on 10/18/2013  Component Date Value Range Status  . Fructosamine 10/18/2013 264  <285 umol/L Final   Comment:                            Variations in levels of serum proteins (albumin and immunoglobulins)                          may affect fructosamine results.                             . Free T4 10/18/2013 0.85  0.60 - 1.60 ng/dL Final  . TSH 10/18/2013 0.92  0.35 - 5.50 uIU/mL Final  . PTH 10/18/2013  251* 15 - 65 pg/mL Final       Medication List       This list is accurate as of: 11/02/13  4:20 PM.  Always use your most recent med list.               aspirin 81 MG tablet  Take 81 mg by mouth daily.     calcitRIOL 0.5 MCG capsule  Commonly known as:  ROCALTROL  Take one capsule by mouth daily, plus an extra capsule three times a week     cholecalciferol 1000 UNITS tablet  Commonly known as:  VITAMIN D  Take 2,000 Units by mouth daily.     levothyroxine 50 MCG tablet  Commonly known as:  SYNTHROID, LEVOTHROID  Take 50 mcg by mouth daily before breakfast. DAW     magnesium 30 MG tablet  Take 250 mg by mouth 2 (two) times daily.     mycophenolate 180 MG EC tablet  Commonly known as:  MYFORTIC  Take 180 mg by mouth 3 (three) times daily.     predniSONE 5 MG Tabs tablet  Commonly known as:  STERAPRED UNI-PAK  Take 5 mg by mouth daily.     tacrolimus 1 MG capsule  Commonly known as:  PROGRAF  Take 1-2 mg by mouth 2 (two) times daily. 2 mg in the am and 1mg  at night     TRADJENTA 5 MG Tabs tablet  Generic drug:  linagliptin  5 mg.        Allergies:  Allergies  Allergen Reactions  . Dapsone Rash    Pain in feet  . Pregabalin Rash    Face rash;red.  . Sulfonamide Derivatives Rash    Past Medical History  Diagnosis Date  . Stroke October 2010, March 2013  . Kidney transplanted 01/2010    [facility], has had some rejection  . Hypertension   . Diabetes mellitus   . IgA nephropathy     perviously on peritoneal dialysis  . Hypothyroidism   . Anemia   . GERD (gastroesophageal reflux disease)   . Immunosuppression   . First degree AV block   . Premature atrial beat   . Premature ventricular beat   . Shingles 03/2011    Past Surgical History  Procedure Laterality Date  . Kidney transplant  5/11    [facility]  . Ureter revision  8/12  . Hernia repair      Family History  Problem Relation Age of Onset  . Coronary artery disease  Mother     Social History:   reports that she has never smoked. She has never used smokeless tobacco. She reports that she does not drink alcohol or use illicit drugs.  Review of Systems - Cardiovascular ROS: no recent hypertension; no history of CVA or MI  Appetite is  somewhat decreased  No recent pedal edema   Examination:   BP 122/76  Pulse 73  Temp(Src) 97.6 F (36.4 C)  Resp 16  Ht 5\' 6"  (1.676 m)  Wt 104 lb 6.4 oz (47.356 kg)  BMI 16.86 kg/m2  SpO2 98%  Body mass index is 16.86 kg/(m^2).   No pedal edema or facial puffiness  Assesment/Plan:   1. Hyperparathyroidism due to renal insufficiency - 588.81 (Primary),  although PTH is higher this is from worsening renal function. Since her calcium is low normal will increase the dose of calcitriol to 10.5 mcg daily Also potassium is normal as also blood pressure    2. Hypothyroidism - 244.9, TSH is normal and she will continue the same dose of  Synthroid. She has been quite compliant with this   3. History of vitamin D deficiency, on OTC supplement    4. Diabetes:  Appears well controlled. Since she is only on 5 mg prednisone  likelyhas underlying type 2 diabetes. blood sugars are excellent at home and A1c improved. She will continue Tradjenta   5. Worsening renal function: She will go to see her nephrologist next week for intervention   Manual Navarra 11/02/2013, 4:20 PM

## 2013-11-24 ENCOUNTER — Ambulatory Visit (INDEPENDENT_AMBULATORY_CARE_PROVIDER_SITE_OTHER): Payer: BC Managed Care – PPO | Admitting: Surgery

## 2013-11-24 ENCOUNTER — Encounter: Payer: Self-pay | Admitting: Endocrinology

## 2013-11-24 ENCOUNTER — Encounter (INDEPENDENT_AMBULATORY_CARE_PROVIDER_SITE_OTHER): Payer: Self-pay | Admitting: Surgery

## 2013-11-24 VITALS — BP 118/70 | HR 72 | Temp 98.8°F | Resp 14 | Ht 66.0 in | Wt 102.0 lb

## 2013-11-24 DIAGNOSIS — N184 Chronic kidney disease, stage 4 (severe): Secondary | ICD-10-CM

## 2013-11-24 DIAGNOSIS — N059 Unspecified nephritic syndrome with unspecified morphologic changes: Secondary | ICD-10-CM

## 2013-11-24 DIAGNOSIS — N028 Recurrent and persistent hematuria with other morphologic changes: Secondary | ICD-10-CM

## 2013-11-24 DIAGNOSIS — Z940 Kidney transplant status: Secondary | ICD-10-CM

## 2013-11-24 NOTE — Patient Instructions (Signed)
Please consider the recommendations that we have given you today:  Consider surgical placement of new CPAD peitoneal dialysis catheter.  [email or web address]  See the Handout(s) we have given you.  Please call our office at [phone] if you wish to schedule surgery or if you have further questions / concerns.   Peritoneal Dialysis (CAPD) Catheter  Healthy kidneys remove excess water and waste products from the body in the form of urine. When the kidneys cannot do this, serious problems develop and a person can fall into kidney failure.   In most cases, the kidneys can heal and recover to a better place.  Sometimes the kidneys remain somewhat damaged and a kidney specialist (nephrologist) needs to help manage the disease with medications and adjustments in diet.    Sometimes the kidney failure has progressed too far.  The body's waste and fluid build up in the blood. Hands and legs swell. You start to feel more weak or nauseated. Your blood pressure may rise, to seeing you at serious risk for heart attack and stroke.  If not treated, you will eventually die. Dialysis is a treatment that replaces the work that your kidneys would do if they were healthy to help keep you alive.  Dialysis can be done using a machine outside of the body (hemodialysis) connected to your blood; or, it can be done with a tube placed to enter your abdomen (peritoneal dialysis). The peritoneum is the inner lining of your abdominal cavity, covering the inner organs & abdominal wall.  This inner lining of peritoneum works like a filter.   Peritoneal dialysis involves placed several quarts of sterile fluid into the inner abdomen/peritoneal cavity.  The waste products and fluids can exchange across the peritoneal membrane.  The fluid is later removed.  This most poor every day.  Sometimes the catheter can be attached to a machine that runs at night while you sleep (continuous  cycler-assisted dialysis = CCPD).  Sometimes the fluid can be infused in her abdomen and then later removed hours later (continuous ambulatory peritoneal dialysis = CAPD).  If your nephrologist feels like you are a good candidate, you will be sent to be evaluated by a surgeon to consider placement of a peritoneal dialysis catheter.  Your nephrologist should have shown you educational videos .  You should discuss with dialysis nurses about what life with a peritoneal dialysis catheter is like.  Most patients who have not had many abdominal operations are reasonable candidates for placement of a peritoneal dialysis catheter   It is important to understand the risks and benefits of the procedure prior to undergoing surgery.    If your surgeon feels you are a reasonable candidate, you will have a peritoneal dialysis catheter placed.    This requires an operation under general anesthesia.  The surgeon places a soft plastic tube ([place] curl catheter) into your abdomen.  The deeper curled tip rests down in the pelvis.  The middle part is tunneled inside your abdominal wall where cuffs provide a water-tight seal.  This leaves an outer foot of plastic tubing resting outside your body, usually in your lower abdomen below the beltline near your waist.  This is a quick procedure that can often be done as an outpatient with the possibility of staying overnight.  It takes several weeks for the catheter to heal into a watertight seal.  Initially, your nephrologist we will have the dialysis nurses do gentle flushes through the catheter.  Once the catheter is well sealed  in, you will begin larger exchanges of fluid to do full peritoneal dialysis.  Your nephrologist and dialysis nurses will help guide you through this.  While placement of the catheter is usually not technically difficult, there are risks to the procedure.  The biggest risk is infection.  This is why we place the catheter in the operating room with the use  of intravenous antibiotics.  Using sterile technique to hook up the catheter to your dialysis fluids is essential.  Most infections of the catheter are mild and can be treated with antibiotics placed in the vein were placed with the peritoneal fluid.  However, sometimes the infection worsens or persists and the catheter needs to be removed.  Occasionally, the catheters can be blocked due to inner organs plugging up the tubing or kinks.  Sometimes the catheter leaks and needs to be replaced.  Sometimes the inner abdomen has too many adhesions from prior surgeries or infections and there is not enough space for fluid exchanges to occur.  Sometimes, peritoneal dialysis is not enough to compensate for the kidney failure.   In rare instances, the inner lining of the abdomen becomes severely thickened or inflamed and peritoneal dialysis can no longer work.  Sometimes it is not possible to safely replace a new catheter and the patient must go on hemodialysis permanently.  It is very common to need hemodialysis intermittently even if you have a peritoneal dialysis catheter in cases of emergencies or if peritoneal dialysis fails.  Your nephrologist and surgeon can help you decide what the best form of dialysis is for you  PERITONEAL DIALYSIS (CAPD) CATHETER PLACEMENT:  POST OPERATIVE INSTRUCTIONS  1. DIET: Follow a light bland diet the first 24 hours after arrival home, such as soup, liquids, crackers, etc.  Be sure to include lots of fluids daily.  Avoid fast food or heavy meals as your are more likely to get nauseated.   2. Take your usually prescribed home medications unless otherwise directed. 3. PAIN CONTROL: a. Pain is best controlled by a usual combination of three different methods TOGETHER: i. Ice/Heat ii. Tylenol (over the counter pain medication) iii. Prescription pain medication b. Most patients will experience some swelling and bruising around the incisions.  Ice packs or heating pads (30-60  minutes up to 6 times a day) will help. Use ice for the first few days to help decrease swelling and bruising, then switch to heat to help relax tight/sore spots and speed recovery.  Some people prefer to use ice alone, heat alone, alternating between ice & heat.  Experiment to what works for you.  Swelling and bruising can take several weeks to resolve.   c. It is helpful to take an over-the-counter pain medication regularly for the first few weeks.  Using acetaminophen (Tylenol, etc) 500-650mg  four times a day (every meal & bedtime) is usually safest since NSAIDs are not advisable in patients with kidney disease. d. A  prescription for pain medication (such as oxycodone, hydrocodone, etc) should be given to you upon discharge.  Take your pain medication as prescribed.  i. If you are having problems/concerns with the prescription medicine (does not control pain, nausea, vomiting, rash, itching, etc), please call us [phone] to see if we need to switch you to a different pain medicine that will work better for you and/or control your side effect better. ii. If you need a refill on your pain medication, please contact your pharmacy.  They will contact our office to request authorization.  Prescriptions will not be filled after 5 pm or on week-ends. 4. Avoid getting constipated.  Between the surgery and the pain medications, it is common to experience some constipation.  Increasing fluid intake and taking a fiber supplement (such as Metamucil, Citrucel, FiberCon, MiraLax, etc) 1-2 times a day regularly will usually help prevent this problem from occurring.  A mild laxative (prune juice, Milk of Magnesia, MiraLax, etc) should be taken according to package directions if there are no bowel movements after 48 hours.   5. Wash / shower every day.  You may shower over the dressings as they are waterproof.  Continue to shower over incision(s) after the dressing is off. 6. The Peritoneal Dialysis nurse will remove  your waterproof bandages in the Dialysis Center a few days after surgery.  Do not remove the bandages until seen by them. 7. ACTIVITIES as tolerated:   a. You may resume regular (light) daily activities beginning the next day-such as daily self-care, walking, climbing stairs-gradually increasing activities as tolerated.  If you can walk 30 minutes without difficulty, it is safe to try more intense activity such as jogging, treadmill, bicycling, low-impact aerobics, swimming, etc. b. Save the most intensive and strenuous activity for last such as sit-ups, heavy lifting, contact sports, etc  Refrain from any heavy lifting or straining until you are off narcotics for pain control.   c. DO NOT PUSH THROUGH PAIN.  Let pain be your guide: If it hurts to do something, don't do it.  Pain is your body warning you to avoid that activity for another week until the pain goes down. d. You may drive when you are no longer taking prescription pain medication, you can comfortably wear a seatbelt, and you can safely maneuver your car and apply brakes. e. Dennis Bast may have sexual intercourse when it is comfortable.  FOLLOW UP with the Peritoneal Dialysis nurses closely after surgery.  Call [phone] to help arrange training/flushes of cathete    -The CAPD nurses & Nephrology usually follow you closely, making the need for follow-up in our office redundant and therefore not needed.  If they or you have concerns, please call us for possible follow-up in our office  -Please call [facility] at [phone] only as needed.  WHEN TO CALL us [phone]: 1. Poor pain control 2. Reactions / problems with new medications (rash/itching, nausea, etc)  3. Fever over 101.5 F (38.5 C) 4. Worsening swelling or bruising 5. Continued bleeding from incision. 6. Increased pain, redness, or drainage from the incision   The clinic staff is available to answer your questions during regular business hours (8:30am-5pm).  Please don't  hesitate to call and ask to speak to one of our nurses for clinical concerns.   If you have a medical emergency, go to the nearest emergency room or call 911.  A surgeon from [facility] is always on call at the hospitals  9. IF YOU HAVE DISABILITY OR FAMILY LEAVE FORMS, BRING THEM TO THE OFFICE FOR PROCESSING.  DO NOT GIVE THEM TO YOUR DOCTOR.  [facility], [place], [place], [place], [place]  [address] ? MAIN: [phone] ? TOLL FREE: [phone] ?  FAX [phone] [email or web address]  Peritoneal Dialysis - An Overview Dialysis can be done using a machine outside of the body (hemodialysis). Or, it can be done inside the body (peritoneal dialysis). The word "peritoneal" refers to the lining or membrane of the belly (abdominal cavity). The peritoneal membrane  is a thin, plastic-like lining inside the belly that covers the organs and fits in the abdominal or peritoneal cavity, such as the stomach, liver and the kidneys. This lining works like a filter. It will allow certain things to pass from your blood through the lining and into a special solution that has been placed into your belly. In this type of dialysis, the peritoneum is used to help clean the blood.  If you need dialysis, your kidneys are not working right. Healthy kidneys take out extra water and waste products, which becomes urine. When the kidneys do not do this, serious problems can develop. The waste and water build up in the blood. Your hands and feet might swell. You may feel tired, weak or sick to your stomach. Also, your blood pressure may rise. If not treated, you could die. Dialysis is a treatment that does the work that your kidneys would do if they were healthy.  It cleans your blood.   It will make sure your body has the right amount of certain chemicals that it needs. They include potassium, sodium and bicarbonate.   It will help control your blood pressure.   UNDERSTANDING PERITONEAL DIALYSIS  Here is how peritoneal dialysis works:   First, you will have surgery to put a soft plastic tube (catheter) into your belly (abdomen). This will allow you to easily connect yourself to special tubing, which will then let a special dialysis solution to be placed into your abdomen.   For each treatment, you will need at least one bag of dialysis solution (a liquid called dialysate). It is a mix of water that is pure and free of germs (sterile), sugar (dextrose) and the nutrients and minerals found in your blood. Sometimes, more than one bag is needed to get the right amount of fluid for your abdomen. Your caregiver will explain what size and how many bags you will need.   The dialysate is slowly put through the catheter to fill the abdomen (called the peritoneal cavity). This dialysate will need to stay in your body for 3-4 hours. This is known as the dwell time.   The solution is working to clean the blood and remove wastes from your body. At the end of this time, the solution is drained from your body through tubing into an empty bag. It is then replaced with a fresh dialysate.   The draining and replacing of the dialysate is called an exchange or cycle. The catheter is capped after each exchange. Once the solution is in your body, you are then free to do whatever you would like until the next exchange. Most people will need to do 4-5 exchanges each day.   There are two different methods that can be used.   Continuous ambulatory peritoneal dialysis (CAPD): You put the solution into your abdomen, cap your catheter and then go about your day. Several hours later, you reconnect to a tubing set up, drain out the solution and then put more solution in. This is done several times a day. No machine is needed.   Continuous cycler-assisted peritoneal dialysis (CCPD): A machine is used, which fills the abdomen with dialysate and then drains it. This happens several times.  It usually is done at night while you are sleeping. When you wake up, you can disconnect from the machine and are free to go to go about your day.  PREPARING FOR EXCHANGES  Discuss the details of the procedure with your caregivers. You will be working  with a nurse who is specially trained in doing dialysis. Make sure you understand:   How to do an exchange.   How much solution you need.   What type of solution you will need.   How often you should do an exchange. Ask:   How many times each day?   When? At meals? At bedtime?   Always keep the dialysate bags and other supplies in a cool, clean and dry place.   Keeping everything clean is very important.   The catheter and its cap must be free from germs (sterile)   The adapter also must be sterile. It attaches the dialysis bag and tubing to the catheter.   Clean the area of your body around the catheter every day. Use a chemical that fights infection (antiseptic).   Wash your hands thoroughly before starting an exchange.   You may be taught to wear a mask to cover your nose and mouth. This makes infection less likely to happen.   You may be taught to close doors, windows and turn off any fans before doing an exchange.   Check the dialysate bag very carefully.   Make sure it is the right size bag for you. This information is on the label.   Also, make sure it is the right mixture. For some people, the dialysate contents vary. For instance, the mixture might be a stronger solution for overnight.   Check the expiration date (the last date you can use the bag). It also is on the label. If the date has gone by, throw away the bag.   The solution should be clear. You should be able to see any writing on the side of the bag clearly through the solution. Do not use a cloudy solution.   Gently squeeze the bag to make sure there are no leaks.   Use a dry heating pad to warm the dialysate in the bag. Leave the cover on the bag while  you do this.   This is for comfort. You can skip this step if you want.   Never place the bag of solution under warm or hot water. Water from a faucet is not sterile and could cause germs to get into the bag. Infection could then result.  PERFORMING AN EXCHANGE  For continuous ambulatory dialysis:   Attach the dialysis bag and tubing to your catheter. Hang the bag so that gravity (the natural downward pull) draws the solution down and into your abdomen once the clamps are opened. This should take about 10 minutes.   Remove the bag and tubing from the catheter. Cap the catheter.   The solution stays in the abdomen for 3-4 hours (dwell time). The solution is working to clean the blood and remove wastes from your body.   When you are ready to drain the solution for another exchange, take the cap off the catheter. Then, attach the catheter to tubing, which is attached to an empty bag. Place this empty bag below the abdomen or on the floor or stool and undo the clamps.   Gravity helps pull the fluid out of the abdomen and into the bag. The fluid in the bag may look yellow and clear, like urine. It usually takes about 20 minutes to drain the fluid out of the abdomen.   When the solution has drained, start the process again by infusing a new bag of dialysate and then capping the catheter.   This should continue until you have used  all of the solution that you are to use each day.   Sometimes, a small machine is used overnight. It is called a mini-cycler. This is done if the body cannot go all night without an exchange. The machine lets you sleep without having to get up and do an exchange.   For continuous cycler-assisted dialysis:   You will be taught how to set up or program your machine.   When you are ready for bed, put the dialysate bags onto the cycler machine. Put on exactly the number of bags that your caregiver said to use.   Connect your catheter to the machine and turn the cycler  machine on.   Overnight, the cycler will do several exchanges. It often does three to five, sometimes more.   Solution that is in your abdomen in the morning will stay during the day. The machine is set to make the daytime solution stronger, if that is needed.   In the morning, you will disconnect from the machine and cap your catheter and go about your day.   Sometimes, an extra exchange is done during the day. This may be needed to remove excess waste or fluid.  IMPORTANT REMINDERS  You will need to follow a very strict schedule. Every step of the dialysis procedure must be done every day. Sometimes, several times a day. Altogether, this might take an extra 2 hours or more. However, you must stick to the routine. Do not skip a day. Do not skip a procedure.   Some people find it helpful to work with a Social worker or Education officer, museum in addition to the renal (kidney) nurse. They can help you figure out how to change your daily routine to fit in the dialysis sessions.   You may need to change your diet. Ask your caregiver for advice, or talk with a nutritionist about what you should and should not eat.   You will need to weigh yourself every day and keep track of what your weight is.   You may be taught how to check your blood pressure before every exchange. Your blood pressure reading will help determine what type of solution to use. If your blood pressure is too high, you may need a stronger solution.  RISKS AND COMPLICATIONS  Possible problems vary, depending on the method you use. Your overall health also can have an effect. Problems that could develop because of dialysis include:  Infection. This is the most common problem. It could occur:   In the peritoneum. This is called peritonitis.   Around the catheter.   Weight gain. The dialysate contains a type of sugar known as dextrose. Dextrose has a lot of calories. The body takes in several hundred calories from this sugar each day.    Weakened muscles in the abdomen. This can result from all of the fluid that your body has to hold in the abdomen.   Catheter replacement. Sometimes, a new one has to be put in.   Change in dialysis method. Due to some complications, you may need to change to hemodialysis for a short time and have your dialysis done at a center.   Trouble adjusting to your new lifestyle. In some people, this leads to depression.   Sleep problems.   Dialysis-related amyloidosis. This sometimes occurs after 5 years of dialysis. Protein builds up in the blood. This can cause painful deposits on bones, joints and tendons (which connect muscle to bone). Or, it can cause hollow spots in bones  that make them more likely to break.   Excess fluid. Your body may absorb too much of the fluid that is held in the abdomen. This can lead to heart or lung problems.  SEEK MEDICAL CARE IF:   You have any problems with an exchange.   The area around the catheter becomes red or painful.   The catheter seems loose, or it feels like it is coming out.   A bag of dialysate looks cloudy. Or, the liquid is an unusual color.   Abdominal pain or discomfort.   You feel sick to your stomach (nauseous) or throw up (vomit).   You develop a fever of more than 102 F (38.9 C).  SEEK IMMEDIATE MEDICAL CARE IF:  You develop a fever of more than 102 F (38.9 C). Document Released: 07/07/2009 Document Revised: 08/29/2011 Document Reviewed: 07/07/2009 [facility] Patient Information 2012 [place].  Diet for Peritoneal Dialysis This diet may be modified in protein, sodium, phosphorus, potassium, or fluid, depending on your needs. The goals of nutrition therapy are similar to those for patients on hemodialysis. Providing enough protein to replace peritoneal losses is a priority. USES OF THIS DIET The diet is designed for the patient with end-stage kidney (renal) disease, who is treated by peritoneal dialysis. Treatment options  include:  Continuous Ambulatory Peritoneal Dialysis (CAPD): Usually 4 exchanges of 1.5 to 2 liter volumes of glucose (sugar) and electrolyte-containing dialysate.   Continuous Cyclic Peritoneal Dialysis (CCPD): Essentially a reversal of CAPD, with shorter exchanges at night and a longer one during the day.   Intermittent Peritoneal Dialysis (IPD): 10 to 12 hours of exchanges, 2 to 3 times weekly.  ADEQUACY The diet may not meet the Recommended Dietary Allowances of the [employer] for calcium and ascorbic acid. Protein and water-soluble vitamin needs may be increased because of losses into the dialysate. Recommended daily supplements are the same as for hemodialysis patients. ASSESSMENT/DETERMINATION OF DIET Dietary needs will differ between patients. Parameters must be individualized. Protein  Guidelines: 1.2 to 1.3 gm/kg/day OR 1.5 gm/kg/day if patient is malnourished, catabolic, or has a protracted episode of peritonitis. A minimum of 50% of the protein intake should be of high biological value.   Goals: Meet protein requirements and replace dialysate losses while avoiding excessive accumulation of waste products. Achieve serum albumin greater than 3.5 g/dL.   Evaluate: Current nutritional status, serum albumin and BUN levels, presence of peritonitis.  Sodium  Guidelines: Usually 90 to 175 mEq (2000 to 4000 mg), but should be individualized.   Goals: Minimize complications of fluid imbalance.   Evaluate: Weight, blood pressure regulation, and presence of swelling (edema).  Potassium  Guidelines: Individualized; often not restricted, and may need to be supplemented.   Goals: Serum K+ levels between 4.0 to 5.0 mEq/L.   Evaluate: Serum K+ levels, usual intake of K+, appetite.  Phosphorus  Guidelines: 800 to 1200 mg/day (the high protein intake results in a high obligatory P intake).   Goal: Serum P levels between 4.5 to 6.0 mg/dL.   Evaluate: Serum P levels, usual  P intake, P-binding medications: type, number, dosage, distribution.  Fluids  Guidelines: Individualized - may not be restricted for all patients.   Goal: Minimize complications of fluid imbalance.   Evaluate: Weight, blood pressure regulation, sodium intake, and presence of edema.  Document Released: 09/09/2005 Document Revised: 08/29/2011 Document Reviewed: 12/02/2006 [facility] Patient Information 2012 [place].

## 2013-11-24 NOTE — Progress Notes (Signed)
Subjective:     Patient ID: Kimberly [name], female   DOB: 26-Apr-1956, 58 y.o.   MRN: [id number]  HPI  Note: This dictation was prepared with Dragon/digital dictation along with [facility] technology. Any transcriptional errors that result from this process are unintentional.       [place]helan  05-28-1956 [id number]  Patient Care Team: Reginia Naas, MD as PCP - General (Family Medicine) Thompson Grayer, MD as Attending Physician (Cardiology) Louis Meckel, MD as Consulting Physician (Nephrology) Christy Sartorius as Consulting Physician (Urology) Audree Bane as Consulting Physician (Urology) Elayne Snare, MD as Consulting Physician (Endocrinology)  This patient is a 58 y.o.female who presents today for surgical evaluation at the request of Dr. Moshe Cipro.   Reason for visit: Recurrent kidney failure.  Request for peritoneal dialysis  Pleasant active woman.  History of IgA nephropathy progressing to kidney failure.  Had peritoneal dialysis catheter placed and 2009.  Worked well.  Develop small umbilical hernia.  Repaired with mesh 2011.  Underwent cadaveric kidney transplantation [facility] 2011.  This was complicated by strictures as a ureteral anastomosis.  Nephrostomy tube placement.  Cystoscopies.  Ureteral revision.  Ultimate kidney failure/projection.  Gradual process.  Fill need for dialysis is evident.  She adamantly wishes to avoid hemodialysis.  Therefore, peritoneal dialysis catheter replacement requested.  Relatively active.  She works.  Can walk a half hour without difficulty.  One episode of cardiac abnormal rhythm but otherwise no major cardiopulmonary issues.  She is not oliguric.  Has a bowel movement every day.  Has gradually lost some weight.  Energy level okay.  No history of any wound/abscess infections with her operations that she can recall.  Patient Active Problem List   Diagnosis Date Noted  . Secondary hyperparathyroidism  (of renal origin) 04/20/2013  . PVC's (premature ventricular contractions) 01/17/2012  . Stroke 11/29/2011  . DM type 2 causing complication 123456  . Kidney transplanted   . IgA nephropathy   . GERD 12/07/2007  . COUGH 11/09/2007  . HYPOTHYROIDISM 10/12/2007  . ANEMIA 10/12/2007  . HYPERTENSION 10/12/2007  . CARDIAC ARRHYTHMIA 10/12/2007  . ASTHMA 10/12/2007  . IGA NEPHROPATHY 10/12/2007  . CKD (chronic kidney disease) stage 4, GFR 15-29 ml/min 10/12/2007    Past Medical History  Diagnosis Date  . Stroke October 2010, March 2013  . Kidney transplanted 01/2010    [facility], has had some rejection  . Hypertension   . Diabetes mellitus   . IgA nephropathy     perviously on peritoneal dialysis  . Hypothyroidism   . Anemia   . GERD (gastroesophageal reflux disease)   . Immunosuppression   . First degree AV block   . Premature atrial beat   . Premature ventricular beat   . Shingles 03/2011    Past Surgical History  Procedure Laterality Date  . Kidney [facility]  . Ureter revision  04/2011    [facility]  . Umbilical hernia repair  10/25/2009    open with mesh,  Dr [name]  . Capd insertion  08/25/2008    open, Dr [name]  . Capd removal  03/22/2010    Dr [name]    History   Social History  . Marital Status: Married    Spouse Name: N/A    Number of Children: N/A  . Years of Education: N/A   Occupational History  . Not on file.   Social History Main Topics  . Smoking status: Never  Smoker   . Smokeless tobacco: Never Used  . Alcohol Use: No  . Drug Use: No  . Sexual Activity: Yes    Birth Control/ Protection: Post-menopausal   Other Topics Concern  . Not on file   Social History Narrative   Pt lives in [place].  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    Family History  Problem Relation Age of Onset  . Coronary artery disease Mother     Current Outpatient Prescriptions  Medication Sig Dispense Refill  .  aspirin 81 MG tablet Take 81 mg by mouth daily.      . calcitRIOL (ROCALTROL) 0.5 MCG capsule Take one capsule by mouth daily, plus an extra capsule three times a week  129 capsule  1  . cholecalciferol (VITAMIN D) 1000 UNITS tablet Take 2,000 Units by mouth daily.      Marland Kitchen levothyroxine (SYNTHROID, LEVOTHROID) 50 MCG tablet Take 50 mcg by mouth daily before breakfast. DAW      . magnesium 30 MG tablet Take 250 mg by mouth 2 (two) times daily.      . mycophenolate (MYFORTIC) 180 MG EC tablet Take 180 mg by mouth 3 (three) times daily.      . predniSONE (STERAPRED UNI-PAK) 5 MG TABS Take 5 mg by mouth daily.      . tacrolimus (PROGRAF) 1 MG capsule Take 1-2 mg by mouth 2 (two) times daily. 2 mg in the am and 1mg  at night      . TRADJENTA 5 MG TABS tablet 5 mg.       No current facility-administered medications for this visit.     Allergies  Allergen Reactions  . Dapsone Rash    Pain in feet  . Pregabalin Rash    Face rash;red.  . Sulfonamide Derivatives Rash    BP 118/70  Pulse 72  Temp(Src) 98.8 F (37.1 C) (Oral)  Resp 14  Ht 5\' 6"  (1.676 m)  Wt 102 lb (46.267 kg)  BMI 16.47 kg/m2  No results found.   Review of Systems  Constitutional: Positive for unexpected weight change. Negative for fever, chills, diaphoresis, appetite change and fatigue.  HENT: Negative for ear discharge, ear pain, sore throat and trouble swallowing.   Eyes: Negative for photophobia, discharge and visual disturbance.  Respiratory: Positive for cough. Negative for choking, chest tightness and shortness of breath.   Cardiovascular: Positive for palpitations. Negative for chest pain.  Gastrointestinal: Negative for nausea, vomiting, abdominal pain, diarrhea, constipation, anal bleeding and rectal pain.  Endocrine: Negative for cold intolerance and heat intolerance.  Genitourinary: Negative for dysuria, urgency, frequency, vaginal bleeding, vaginal discharge, difficulty urinating and pelvic pain.    Musculoskeletal: Negative for gait problem, myalgias and neck pain.  Skin: Negative for color change, pallor and rash.  Allergic/Immunologic: Negative for environmental allergies, food allergies and immunocompromised state.  Neurological: Negative for dizziness, speech difficulty, weakness and numbness.  Hematological: Negative for adenopathy. Bruises/bleeds easily.  Psychiatric/Behavioral: Negative for confusion and agitation. The patient is not nervous/anxious.        Objective:   Physical Exam  Constitutional: She is oriented to person, place, and time. She appears well-developed. She appears cachectic. She is cooperative. No distress.  HENT:  Head: Normocephalic.  Mouth/Throat: Oropharynx is clear and moist. No oropharyngeal exudate.  Eyes: Conjunctivae and EOM are normal. Pupils are equal, round, and reactive to light. No scleral icterus.  Neck: Normal range of motion. Neck supple. No tracheal deviation present.  Cardiovascular: Normal rate, regular rhythm and intact distal pulses.   Pulmonary/Chest: Effort normal and breath sounds normal. No stridor. No respiratory distress. She exhibits no tenderness.  Abdominal: Soft. She exhibits no distension and no mass. There is no tenderness. There is no rigidity, no rebound, no guarding, no tenderness at McBurney's point and negative Murphy's sign. No hernia. Hernia confirmed negative in the ventral area, confirmed negative in the right inguinal area and confirmed negative in the left inguinal area.    Genitourinary: No vaginal discharge found.  Musculoskeletal: Normal range of motion. She exhibits no tenderness.       Right elbow: She exhibits normal range of motion.       Left elbow: She exhibits normal range of motion.       Right wrist: She exhibits normal range of motion.       Left wrist: She exhibits normal range of motion.       Right hand: Normal strength noted.       Left hand: Normal strength noted.  Lymphadenopathy:        Head (right side): No posterior auricular adenopathy present.       Head (left side): No posterior auricular adenopathy present.    She has no cervical adenopathy.    She has no axillary adenopathy.       Right: No inguinal adenopathy present.       Left: No inguinal adenopathy present.  Neurological: She is alert and oriented to person, place, and time. No cranial nerve deficit. She exhibits normal muscle tone. Coordination normal.  Skin: Skin is warm and dry. No rash noted. She is not diaphoretic. No erythema.  Psychiatric: She has a normal mood and affect. Her behavior is normal. Judgment and thought content normal.       Assessment:     IgA nephropathy with recurrent kidney failure/CKDisease status post failing kidney transplant.  Request for peritoneal dialysis     Plan:     While she has prior abdominal surgeries, I think it is reasonable for diagnostic laparoscopy and see if a peritoneal dialysis catheter can be safely placed.  She is right-handed.  Kidney transplant on the right.  Therefore, and despite placement of the peritoneal dialysis catheter on the left side.  I did discuss with her.  She is used peritoneal dialysis for 2 years & feels comfortable with the process.  I did caution her that it is highly likely she will need hemodialysis at some point in her life, but it is reasonable to do the peritoneal dialysis catheter and see if this will be successful for her until she can get a kidney transplant:  The anatomy & physiology of peritoneum was discussed.  Natural history risks without surgery of worsening renal failure was discussed.   I feel the risks of no intervention will lead to serious problems that outweigh the operative risks; therefore, I recommended placement of a peritoneal dialysis catheter.  I explained laparoscopic techniques with possible need for an open approach.    Risks such as bleeding, infection, abscess, injury to other organs, catheter occlusion or  malpositioning, reoperation to remove/reposition the catheter, heart attack, death, and other risks were discussed.   I noted a good likelihood this will help address the problem.  Possibility that this will not be enough to compensate for the renal failure & need for further treatment such as hemodialysis was explained.  Goals of post-operative recovery were discussed as well.  We will work to minimize  complications.   The patient is/will be getting training on catheter use by dialysis nursing before and after surgery.  I stressed the importance of meticulous care & sterile technique to prevent catheter problems.  Questions were answered.  The patient expresses understanding & wishes to proceed with surgery.

## 2013-12-01 ENCOUNTER — Encounter (HOSPITAL_COMMUNITY): Payer: Self-pay

## 2013-12-07 ENCOUNTER — Encounter (INDEPENDENT_AMBULATORY_CARE_PROVIDER_SITE_OTHER): Payer: Self-pay

## 2013-12-07 ENCOUNTER — Other Ambulatory Visit: Payer: Self-pay | Admitting: *Deleted

## 2013-12-07 ENCOUNTER — Telehealth: Payer: Self-pay | Admitting: Endocrinology

## 2013-12-07 MED ORDER — LINAGLIPTIN 5 MG PO TABS: 5.0000 mg | ORAL_TABLET | Freq: Every day | ORAL | Status: DC

## 2013-12-07 NOTE — Telephone Encounter (Signed)
rx sent patient aware

## 2013-12-07 NOTE — Telephone Encounter (Signed)
Left message on both #'s asking which pharmacy to send it too.

## 2013-12-07 NOTE — Telephone Encounter (Signed)
[facility] on [address] [phone]

## 2013-12-07 NOTE — Pre-Procedure Instructions (Signed)
[place]  12/07/2013   Your procedure is scheduled on:  Tues, Mar 24 @ 11:00 AM  Report to [facility]  at 9:00 AM.  Call this number if you have problems the morning of surgery: [phone]   Remember:   Do not eat food or drink liquids after midnight.   Take these medicines the morning of surgery with A SIP OF WATER: Synthroid(Levothyroxine),Prednisone(Deltasone),Myfortic(Mycophenolate),and Tacrolimus(Prograf)             Stop taking your Aspirin. No Goody's,BC's,Aleve,Ibuprofen,Fish Oil,or any Herbal Medications   Do not wear jewelry, make-up or nail polish.  Do not wear lotions, powders, or perfumes. You may wear deodorant.  Do not shave 48 hours prior to surgery.   Do not bring valuables to the hospital.  [facility] is not responsible                  for any belongings or valuables.               Contacts, dentures or bridgework may not be worn into surgery.  Leave suitcase in the car. After surgery it may be brought to your room.  For patients admitted to the hospital, discharge time is determined by your                treatment team.               Patients discharged the day of surgery will not be allowed to drive  home.    Special Instructions:  [facility] - Preparing for Surgery  Before surgery, you can play an important role.  Because skin is not sterile, your skin needs to be as free of germs as possible.  You can reduce the number of germs on you skin by washing with CHG (chlorahexidine gluconate) soap before surgery.  CHG is an antiseptic cleaner which kills germs and bonds with the skin to continue killing germs even after washing.  Please DO NOT use if you have an allergy to CHG or antibacterial soaps.  If your skin becomes reddened/irritated stop using the CHG and inform your nurse when you arrive at Short Stay.  Do not shave (including legs and underarms) for at least 48 hours prior to the first CHG shower.  You may shave your face.  Please  follow these instructions carefully:   1.  Shower with CHG Soap the night before surgery and the                                morning of Surgery.  2.  If you choose to wash your hair, wash your hair first as usual with your       normal shampoo.  3.  After you shampoo, rinse your hair and body thoroughly to remove the                      Shampoo.  4.  Use CHG as you would any other liquid soap.  You can apply chg directly       to the skin and wash gently with scrungie or a clean washcloth.  5.  Apply the CHG Soap to your body ONLY FROM THE NECK DOWN.        Do not use on open wounds or open sores.  Avoid contact with your eyes,       ears, mouth and genitals (  private parts).  Wash genitals (private parts)       with your normal soap.  6.  Wash thoroughly, paying special attention to the area where your surgery        will be performed.  7.  Thoroughly rinse your body with warm water from the neck down.  8.  DO NOT shower/wash with your normal soap after using and rinsing off       the CHG Soap.  9.  Pat yourself dry with a clean towel.            10.  Wear clean pajamas.            11.  Place clean sheets on your bed the night of your first shower and do not        sleep with pets.  Day of Surgery  Do not apply any lotions/deoderants the morning of surgery.  Please wear clean clothes to the hospital/surgery center.     Please read over the following fact sheets that you were given: Pain Booklet, Coughing and Deep Breathing and Surgical Site Infection Prevention

## 2013-12-07 NOTE — Telephone Encounter (Signed)
Pt requesting Trajenta to be called in for 90 day supply. Patient is out

## 2013-12-08 ENCOUNTER — Encounter (HOSPITAL_COMMUNITY): Payer: Self-pay

## 2013-12-08 ENCOUNTER — Ambulatory Visit (HOSPITAL_COMMUNITY)
Admission: RE | Admit: 2013-12-08 | Discharge: 2013-12-08 | Disposition: A | Discharge status: Home / Self Care | Payer: BC Managed Care – PPO | Source: Ambulatory Visit | Attending: Anesthesiology | Admitting: Anesthesiology

## 2013-12-08 ENCOUNTER — Encounter (HOSPITAL_COMMUNITY)
Admission: RE | Admit: 2013-12-08 | Discharge: 2013-12-08 | Disposition: A | Discharge status: Home / Self Care | Payer: BC Managed Care – PPO | Source: Ambulatory Visit | Attending: Surgery | Admitting: Surgery

## 2013-12-08 DIAGNOSIS — Z01818 Encounter for other preprocedural examination: Secondary | ICD-10-CM | POA: Insufficient documentation

## 2013-12-08 DIAGNOSIS — Z01810 Encounter for preprocedural cardiovascular examination: Secondary | ICD-10-CM | POA: Insufficient documentation

## 2013-12-08 DIAGNOSIS — Z01812 Encounter for preprocedural laboratory examination: Secondary | ICD-10-CM | POA: Insufficient documentation

## 2013-12-08 HISTORY — DX: Kidney transplant rejection: T86.11

## 2013-12-08 HISTORY — DX: Kidney transplant failure: T86.12

## 2013-12-08 HISTORY — DX: Personal history of other specified conditions: Z87.898

## 2013-12-08 HISTORY — DX: Personal history of other diseases of the respiratory system: Z87.09

## 2013-12-08 LAB — BASIC METABOLIC PANEL
BUN: 86 mg/dL — ABNORMAL HIGH (ref 6–23)
CALCIUM: 9 mg/dL (ref 8.4–10.5)
CHLORIDE: 107 meq/L (ref 96–112)
CO2: 16 meq/L — AB (ref 19–32)
CREATININE: 7.37 mg/dL — AB (ref 0.50–1.10)
GFR calc Af Amer: 6 mL/min — ABNORMAL LOW (ref >90)
GFR, EST NON AFRICAN AMERICAN: 5 mL/min — AB (ref >90)
Glucose, Bld: 100 mg/dL — ABNORMAL HIGH (ref 70–99)
Potassium: 4.2 mEq/L (ref 3.7–5.3)
Sodium: 143 mEq/L (ref 137–147)

## 2013-12-08 LAB — CBC
HCT: 29.9 % — ABNORMAL LOW (ref 36.0–46.0)
Hemoglobin: 9.7 g/dL — ABNORMAL LOW (ref 12.0–15.0)
MCH: 29.1 pg (ref 26.0–34.0)
MCHC: 32.4 g/dL (ref 30.0–36.0)
MCV: 89.8 fL (ref 78.0–100.0)
PLATELETS: 187 10*3/uL (ref 150–400)
RBC: 3.33 MIL/uL — AB (ref 3.87–5.11)
RDW: 14.1 % (ref 11.5–15.5)
WBC: 4 10*3/uL (ref 4.0–10.5)

## 2013-12-09 NOTE — Progress Notes (Addendum)
Anesthesia Chart Review:  Patient is a 58 year old female scheduled for laparoscopic exploration and placement of PD catheter on 12/14/13 by Dr. Johney Maine.  History includes IgA nephropathy with PD '09 s/p cadaveric kidney transplant '11 ([facility]) complicated by ureteral anastomosis with nephrostomy tube placement and ureteral revision now with recurrent renal failure. She is s/p UHR with mesh '11, left parietal CVA 08/2009, DM2, AV block (queston second or third degree) with bradycardia in the setting of hospitalization for diarrhea/colitis related to Cellcept s/p bowel prep 09/2011 with normal stress echo and SR with first degree AVB with occasional PACs/PVCS by event monitor, secondary hyperparathyroidism, hypothyroidism, HTN, asthma. PCP is Dr. [name].  Nephrologist is Dr. Corliss Parish. Endocrinologist is Dr. Elayne Snare. Cardiologist is Dr. Thompson Grayer, last visit on 01/17/12 and only PRN follow-up was recommended.  EKG on 12/08/13 showed SB with first degree AVB, rightward axis, borderline ECG. Compared to 11/29/11 and 01/17/12 EKGs  first degree AVB was new.  I'm still awaiting actual report records, but according to [facility] 06/08/13 notes by Dr. Lillia Abed, 03/2013 TTE: WNL; Stress TTE: negative at 13.4 METS.   CXR report on 12/08/13 showed: Two views of the chest demonstrate hyperinflation and findings are suggestive for emphysematous disease. There are 2 nodular densities in the lower chest which are most compatible with nipple shadows. There is no evidence for acute airspace disease or edema. Heart size is normal. The trachea is midline.   Preoperative labs noted.  ISTAT on arrival.  H/H 9.7/29.9 (hgb 10.3 05/2013 at [facility]).  Patient with normal stress echo within the past year by notes. BUN/Cr elevated as expected. H/H overall stable.  If ISTAT results acceptable then I would anticipate she can proceed as planned.  Surgeon and/or anesthesiologist can consider T&S on the day of surgery based on  follow-up H/H results.  [facility] Short Stay Center/Anesthesiology Phone [phone] 12/09/2013 12:10 PM  Addendum: 12/09/13 3:20  Additional records received from [facility].  Echo on 04/13/2013 showed normal left ventricular systolic function with mild LVH, normal right ventricular systolic function, MR/PR/TR. No valvular stenosis. Stress echo portion was indeterminate but showed normal resting study and no wall motion abnormalities at subtarget heart rate (< 127 BPM, Target = 139 BPM). Maximum work load of 13.40 METS was achieved during exercise. EF was greater than 55%.

## 2013-12-13 ENCOUNTER — Encounter: Payer: Self-pay | Admitting: Endocrinology

## 2013-12-13 ENCOUNTER — Encounter: Payer: Self-pay | Admitting: *Deleted

## 2013-12-13 MED ORDER — CHLORHEXIDINE GLUCONATE 4 % EX LIQD
1.0000 "application " | Freq: Once | CUTANEOUS | Status: DC
  Filled 2013-12-13: qty 15

## 2013-12-13 MED ORDER — CEFAZOLIN SODIUM-DEXTROSE 2-3 GM-% IV SOLR
2.0000 g | INTRAVENOUS | Status: AC
  Administered 2013-12-14: 2 g via INTRAVENOUS
  Filled 2013-12-13: qty 50

## 2013-12-13 MED ORDER — METRONIDAZOLE IN NACL 5-0.79 MG/ML-% IV SOLN
500.0000 mg | INTRAVENOUS | Status: AC
  Administered 2013-12-14: .5 g via INTRAVENOUS
  Filled 2013-12-13: qty 100

## 2013-12-14 ENCOUNTER — Ambulatory Visit (HOSPITAL_COMMUNITY): Payer: BC Managed Care – PPO | Admitting: Anesthesiology

## 2013-12-14 ENCOUNTER — Encounter (HOSPITAL_COMMUNITY)
Admission: RE | Disposition: A | Discharge status: Home / Self Care | Payer: Self-pay | Source: Ambulatory Visit | Attending: Surgery

## 2013-12-14 ENCOUNTER — Encounter (HOSPITAL_COMMUNITY): Payer: Self-pay | Admitting: Surgery

## 2013-12-14 ENCOUNTER — Ambulatory Visit (HOSPITAL_COMMUNITY)
Admission: RE | Admit: 2013-12-14 | Discharge: 2013-12-14 | Disposition: A | Discharge status: Home / Self Care | Payer: BC Managed Care – PPO | Source: Ambulatory Visit | Attending: Surgery | Admitting: Surgery

## 2013-12-14 ENCOUNTER — Encounter (HOSPITAL_COMMUNITY): Payer: BC Managed Care – PPO | Admitting: Vascular Surgery

## 2013-12-14 DIAGNOSIS — I129 Hypertensive chronic kidney disease with stage 1 through stage 4 chronic kidney disease, or unspecified chronic kidney disease: Secondary | ICD-10-CM | POA: Insufficient documentation

## 2013-12-14 DIAGNOSIS — Z940 Kidney transplant status: Secondary | ICD-10-CM | POA: Insufficient documentation

## 2013-12-14 DIAGNOSIS — Z7982 Long term (current) use of aspirin: Secondary | ICD-10-CM | POA: Insufficient documentation

## 2013-12-14 DIAGNOSIS — E039 Hypothyroidism, unspecified: Secondary | ICD-10-CM | POA: Insufficient documentation

## 2013-12-14 DIAGNOSIS — K660 Peritoneal adhesions (postprocedural) (postinfection): Secondary | ICD-10-CM | POA: Insufficient documentation

## 2013-12-14 DIAGNOSIS — Z79899 Other long term (current) drug therapy: Secondary | ICD-10-CM | POA: Insufficient documentation

## 2013-12-14 DIAGNOSIS — E119 Type 2 diabetes mellitus without complications: Secondary | ICD-10-CM | POA: Insufficient documentation

## 2013-12-14 DIAGNOSIS — N186 End stage renal disease: Secondary | ICD-10-CM

## 2013-12-14 DIAGNOSIS — N184 Chronic kidney disease, stage 4 (severe): Secondary | ICD-10-CM | POA: Insufficient documentation

## 2013-12-14 DIAGNOSIS — K432 Incisional hernia without obstruction or gangrene: Secondary | ICD-10-CM | POA: Insufficient documentation

## 2013-12-14 DIAGNOSIS — Z4902 Encounter for fitting and adjustment of peritoneal dialysis catheter: Secondary | ICD-10-CM | POA: Insufficient documentation

## 2013-12-14 DIAGNOSIS — N2581 Secondary hyperparathyroidism of renal origin: Secondary | ICD-10-CM | POA: Insufficient documentation

## 2013-12-14 DIAGNOSIS — J45909 Unspecified asthma, uncomplicated: Secondary | ICD-10-CM | POA: Insufficient documentation

## 2013-12-14 DIAGNOSIS — Z8673 Personal history of transient ischemic attack (TIA), and cerebral infarction without residual deficits: Secondary | ICD-10-CM | POA: Insufficient documentation

## 2013-12-14 DIAGNOSIS — I4949 Other premature depolarization: Secondary | ICD-10-CM | POA: Insufficient documentation

## 2013-12-14 DIAGNOSIS — IMO0002 Reserved for concepts with insufficient information to code with codable children: Secondary | ICD-10-CM | POA: Insufficient documentation

## 2013-12-14 DIAGNOSIS — N059 Unspecified nephritic syndrome with unspecified morphologic changes: Secondary | ICD-10-CM | POA: Insufficient documentation

## 2013-12-14 DIAGNOSIS — Z992 Dependence on renal dialysis: Secondary | ICD-10-CM

## 2013-12-14 DIAGNOSIS — K219 Gastro-esophageal reflux disease without esophagitis: Secondary | ICD-10-CM | POA: Insufficient documentation

## 2013-12-14 DIAGNOSIS — D649 Anemia, unspecified: Secondary | ICD-10-CM | POA: Insufficient documentation

## 2013-12-14 HISTORY — PX: LAPAROSCOPIC LYSIS OF ADHESIONS: SHX5905

## 2013-12-14 HISTORY — PX: INSERTION OF MESH: SHX5868

## 2013-12-14 HISTORY — PX: VENTRAL HERNIA REPAIR: SHX424

## 2013-12-14 HISTORY — PX: CAPD INSERTION: SHX5233

## 2013-12-14 LAB — GLUCOSE, CAPILLARY
Glucose-Capillary: 97 mg/dL (ref 70–99)
Glucose-Capillary: 181 mg/dL — ABNORMAL HIGH (ref 70–99)

## 2013-12-14 LAB — POCT I-STAT 4, (NA,K, GLUC, HGB,HCT)
GLUCOSE: 90 mg/dL (ref 70–99)
HCT: 27 % — ABNORMAL LOW (ref 36.0–46.0)
HEMOGLOBIN: 9.2 g/dL — AB (ref 12.0–15.0)
POTASSIUM: 3.9 meq/L (ref 3.7–5.3)
Sodium: 144 mEq/L (ref 137–147)

## 2013-12-14 SURGERY — LAPAROSCOPIC INSERTION CONTINUOUS AMBULATORY PERITONEAL DIALYSIS  (CAPD) CATHETER
Anesthesia: General | Site: Abdomen

## 2013-12-14 MED ORDER — BUPIVACAINE-EPINEPHRINE PF 0.25-1:200000 % IJ SOLN
INTRAMUSCULAR | Status: AC
  Filled 2013-12-14: qty 30

## 2013-12-14 MED ORDER — LIDOCAINE HCL (CARDIAC) 20 MG/ML IV SOLN
INTRAVENOUS | Status: DC | PRN
  Administered 2013-12-14: 60 mg via INTRAVENOUS

## 2013-12-14 MED ORDER — PROPOFOL 10 MG/ML IV BOLUS
INTRAVENOUS | Status: DC | PRN
  Administered 2013-12-14: 115 mg via INTRAVENOUS

## 2013-12-14 MED ORDER — MIDAZOLAM HCL 5 MG/5ML IJ SOLN
INTRAMUSCULAR | Status: DC | PRN
  Administered 2013-12-14: 2 mg via INTRAVENOUS

## 2013-12-14 MED ORDER — HYDROMORPHONE HCL PF 1 MG/ML IJ SOLN: 0.2500 mg | INTRAMUSCULAR | Status: DC | PRN

## 2013-12-14 MED ORDER — OXYCODONE HCL 5 MG/5ML PO SOLN: 5.0000 mg | Freq: Once | ORAL | Status: DC | PRN

## 2013-12-14 MED ORDER — TRAMADOL HCL 50 MG PO TABS
50.0000 mg | ORAL_TABLET | Freq: Four times a day (QID) | ORAL | Status: DC | PRN
Start: 2013-12-14 — End: 2014-07-04

## 2013-12-14 MED ORDER — PHENYLEPHRINE HCL 10 MG/ML IJ SOLN
INTRAMUSCULAR | Status: DC | PRN
  Administered 2013-12-14: 80 ug via INTRAVENOUS
  Administered 2013-12-14: 120 ug via INTRAVENOUS

## 2013-12-14 MED ORDER — SODIUM CHLORIDE 0.9 % IV SOLN
INTRAVENOUS | Status: DC
  Administered 2013-12-14 (×3): via INTRAVENOUS

## 2013-12-14 MED ORDER — GLYCOPYRROLATE 0.2 MG/ML IJ SOLN
INTRAMUSCULAR | Status: DC | PRN
  Administered 2013-12-14: .4 mg via INTRAVENOUS

## 2013-12-14 MED ORDER — ARTIFICIAL TEARS OP OINT
TOPICAL_OINTMENT | OPHTHALMIC | Status: DC | PRN
  Administered 2013-12-14: 1 via OPHTHALMIC

## 2013-12-14 MED ORDER — PROPOFOL 10 MG/ML IV BOLUS
INTRAVENOUS | Status: AC
  Filled 2013-12-14: qty 20

## 2013-12-14 MED ORDER — NEOSTIGMINE METHYLSULFATE 1 MG/ML IJ SOLN
INTRAMUSCULAR | Status: DC | PRN
  Administered 2013-12-14: 3 mg via INTRAVENOUS

## 2013-12-14 MED ORDER — SODIUM CHLORIDE 0.9 % IR SOLN
Status: DC | PRN
  Administered 2013-12-14: 1000 mL

## 2013-12-14 MED ORDER — ROCURONIUM BROMIDE 100 MG/10ML IV SOLN
INTRAVENOUS | Status: DC | PRN
  Administered 2013-12-14: 35 mg via INTRAVENOUS

## 2013-12-14 MED ORDER — FENTANYL CITRATE 0.05 MG/ML IJ SOLN
INTRAMUSCULAR | Status: AC
  Filled 2013-12-14: qty 5

## 2013-12-14 MED ORDER — MIDAZOLAM HCL 2 MG/2ML IJ SOLN
INTRAMUSCULAR | Status: AC
  Filled 2013-12-14: qty 2

## 2013-12-14 MED ORDER — OXYCODONE HCL 5 MG PO TABS: 5.0000 mg | ORAL_TABLET | Freq: Once | ORAL | Status: DC | PRN

## 2013-12-14 MED ORDER — ACETAMINOPHEN 10 MG/ML IV SOLN
INTRAVENOUS | Status: DC | PRN
  Administered 2013-12-14: 1000 mg via INTRAVENOUS

## 2013-12-14 MED ORDER — SODIUM CHLORIDE 0.9 % IR SOLN
Status: DC | PRN
  Administered 2013-12-14: 12:00:00

## 2013-12-14 MED ORDER — ACETAMINOPHEN 10 MG/ML IV SOLN
1000.0000 mg | INTRAVENOUS | Status: DC
  Filled 2013-12-14: qty 100

## 2013-12-14 MED ORDER — ONDANSETRON HCL 4 MG/2ML IJ SOLN
INTRAMUSCULAR | Status: DC | PRN
  Administered 2013-12-14: 4 mg via INTRAVENOUS

## 2013-12-14 MED ORDER — BUPIVACAINE-EPINEPHRINE 0.25% -1:200000 IJ SOLN
INTRAMUSCULAR | Status: DC | PRN
  Administered 2013-12-14: 30 mL

## 2013-12-14 MED ORDER — FENTANYL CITRATE 0.05 MG/ML IJ SOLN
INTRAMUSCULAR | Status: DC | PRN
  Administered 2013-12-14 (×2): 100 ug via INTRAVENOUS
  Administered 2013-12-14: 50 ug via INTRAVENOUS

## 2013-12-14 MED ORDER — METOCLOPRAMIDE HCL 5 MG/ML IJ SOLN: 10.0000 mg | Freq: Once | INTRAMUSCULAR | Status: DC | PRN

## 2013-12-14 SURGICAL SUPPLY — 56 items
ADAPTER TITANIUM MEDIONICS (MISCELLANEOUS) ×1 IMPLANT
ADPR DLYS CATH STRL LF DISP (MISCELLANEOUS) ×1
BAG DECANTER FOR FLEXI CONT (MISCELLANEOUS) ×2 IMPLANT
CANISTER SUCTION 2500CC (MISCELLANEOUS) ×1 IMPLANT
CATH EXTENDED DIALYSIS (CATHETERS) ×1 IMPLANT
CHLORAPREP W/TINT 26ML (MISCELLANEOUS) ×2 IMPLANT
COVER SURGICAL LIGHT HANDLE (MISCELLANEOUS) ×2 IMPLANT
DEVICE SECURE STRAP 25 ABSORB (INSTRUMENTS) ×1 IMPLANT
DEVICE TROCAR PUNCTURE CLOSURE (ENDOMECHANICALS) ×1 IMPLANT
DRAPE WARM FLUID 44X44 (DRAPE) ×2 IMPLANT
DRSG OPSITE 4X5.5 SM (GAUZE/BANDAGES/DRESSINGS) IMPLANT
DRSG PAD ABDOMINAL 8X10 ST (GAUZE/BANDAGES/DRESSINGS) IMPLANT
DRSG TEGADERM 2-3/8X2-3/4 SM (GAUZE/BANDAGES/DRESSINGS) ×1 IMPLANT
DRSG TEGADERM 4X4.75 (GAUZE/BANDAGES/DRESSINGS) ×1 IMPLANT
ELECT REM PT RETURN 9FT ADLT (ELECTROSURGICAL) ×2
ELECTRODE REM PT RTRN 9FT ADLT (ELECTROSURGICAL) ×1 IMPLANT
GAUZE SPONGE 2X2 8PLY STRL LF (GAUZE/BANDAGES/DRESSINGS) IMPLANT
GLOVE BIO SURGEON STRL SZ7 (GLOVE) ×2 IMPLANT
GLOVE BIOGEL PI IND STRL 6.5 (GLOVE) IMPLANT
GLOVE BIOGEL PI IND STRL 7.0 (GLOVE) IMPLANT
GLOVE BIOGEL PI IND STRL 7.5 (GLOVE) IMPLANT
GLOVE BIOGEL PI IND STRL 8 (GLOVE) ×1 IMPLANT
GLOVE BIOGEL PI INDICATOR 6.5 (GLOVE) ×1
GLOVE BIOGEL PI INDICATOR 7.0 (GLOVE) ×1
GLOVE BIOGEL PI INDICATOR 7.5 (GLOVE) ×2
GLOVE BIOGEL PI INDICATOR 8 (GLOVE) ×1
GLOVE ECLIPSE 7.5 STRL STRAW (GLOVE) ×1 IMPLANT
GLOVE ECLIPSE 8.0 STRL XLNG CF (GLOVE) ×2 IMPLANT
GLOVE SURG SS PI 6.5 STRL IVOR (GLOVE) ×1 IMPLANT
GLOVE SURG SS PI 7.0 STRL IVOR (GLOVE) ×2 IMPLANT
GOWN STRL REUS W/ TWL LRG LVL3 (GOWN DISPOSABLE) ×2 IMPLANT
GOWN STRL REUS W/ TWL XL LVL3 (GOWN DISPOSABLE) ×1 IMPLANT
GOWN STRL REUS W/TWL LRG LVL3 (GOWN DISPOSABLE) ×4
GOWN STRL REUS W/TWL XL LVL3 (GOWN DISPOSABLE) ×2
KIT BASIN OR (CUSTOM PROCEDURE TRAY) ×2 IMPLANT
KIT ROOM TURNOVER OR (KITS) ×2 IMPLANT
MESH VENTRALIGHT ST 4X6IN (Mesh General) ×1 IMPLANT
NEEDLE 22X1 1/2 (OR ONLY) (NEEDLE) ×2 IMPLANT
NS IRRIG 1000ML POUR BTL (IV SOLUTION) ×2 IMPLANT
PAD ARMBOARD 7.5X6 YLW CONV (MISCELLANEOUS) ×4 IMPLANT
SCISSORS LAP 5X35 DISP (ENDOMECHANICALS) ×1 IMPLANT
SET EXTENSION TUBING 8  CATH (SET/KITS/TRAYS/PACK) ×2 IMPLANT
SLEEVE ENDOPATH XCEL 5M (ENDOMECHANICALS) ×1 IMPLANT
SPONGE GAUZE 2X2 STER 10/PKG (GAUZE/BANDAGES/DRESSINGS) ×1
STYLET FALLER MEDIONICS (MISCELLANEOUS) ×1 IMPLANT
SUT MNCRL AB 4-0 PS2 18 (SUTURE) ×2 IMPLANT
SUT PROLENE 0 CT 2 (SUTURE) ×1 IMPLANT
SUT PROLENE 0 SH 30 (SUTURE) ×3 IMPLANT
SUT PROLENE 2 0 CT2 30 (SUTURE) ×6 IMPLANT
SUT SILK 2 0 SH (SUTURE) IMPLANT
TOWEL OR 17X24 6PK STRL BLUE (TOWEL DISPOSABLE) ×2 IMPLANT
TOWEL OR 17X26 10 PK STRL BLUE (TOWEL DISPOSABLE) ×2 IMPLANT
TRAY LAPAROSCOPIC (CUSTOM PROCEDURE TRAY) ×2 IMPLANT
TROCAR XCEL NON BLADE 8MM B8LT (ENDOMECHANICALS) ×1 IMPLANT
TROCAR XCEL NON-BLD 11X100MML (ENDOMECHANICALS) ×2 IMPLANT
TROCAR XCEL NON-BLD 5MMX100MML (ENDOMECHANICALS) ×3 IMPLANT

## 2013-12-14 NOTE — Progress Notes (Signed)
Dr. Grandville Silos returned phone call,  OK to discharge patient without voiding since patient is a kidney patient.

## 2013-12-14 NOTE — Discharge Instructions (Signed)
PERITONEAL DIALYSIS (CAPD) CATHETER PLACEMENT:  POST OPERATIVE INSTRUCTIONS  1. DIET: Follow a light bland diet the first 24 hours after arrival home, such as soup, liquids, crackers, etc.  Be sure to include lots of fluids daily.  Avoid fast food or heavy meals as your are more likely to get nauseated.   2. Take your usually prescribed home medications unless otherwise directed. 3. PAIN CONTROL: a. Pain is best controlled by a usual combination of three different methods TOGETHER: i. Ice/Heat ii. Tylenol (over the counter pain medication) iii. Prescription pain medication b. Most patients will experience some swelling and bruising around the incisions.  Ice packs or heating pads (30-60 minutes up to 6 times a day) will help. Use ice for the first few days to help decrease swelling and bruising, then switch to heat to help relax tight/sore spots and speed recovery.  Some people prefer to use ice alone, heat alone, alternating between ice & heat.  Experiment to what works for you.  Swelling and bruising can take several weeks to resolve.   c. It is helpful to take an over-the-counter pain medication regularly for the first few weeks.  Using acetaminophen (Tylenol, etc) 500-650mg  four times a day (every meal & bedtime) is usually safest since NSAIDs are not advisable in patients with kidney disease. d. A  prescription for pain medication (such as oxycodone, hydrocodone, etc) should be given to you upon discharge.  Take your pain medication as prescribed.  i. If you are having problems/concerns with the prescription medicine (does not control pain, nausea, vomiting, rash, itching, etc), please call us [phone] to see if we need to switch you to a different pain medicine that will work better for you and/or control your side effect better. ii. If you need a refill on your pain medication, please contact your pharmacy.  They will contact our office to request authorization. Prescriptions will not be  filled after 5 pm or on week-ends. 4. Avoid getting constipated.  Between the surgery and the pain medications, it is common to experience some constipation.  Increasing fluid intake and taking a fiber supplement (such as Metamucil, Citrucel, FiberCon, MiraLax, etc) 1-2 times a day regularly will usually help prevent this problem from occurring.  A mild laxative (prune juice, Milk of Magnesia, MiraLax, etc) should be taken according to package directions if there are no bowel movements after 48 hours.   5. Wash / shower every day.  You may shower over the dressings as they are waterproof.  Continue to shower over incision(s) after the dressing is off. 6. The Peritoneal Dialysis nurse will remove your waterproof bandages in the Dialysis Center a few days after surgery.  Do not remove the bandages until seen by them. 7. ACTIVITIES as tolerated:   a. You may resume regular (light) daily activities beginning the next day--such as daily self-care, walking, climbing stairs--gradually increasing activities as tolerated.  If you can walk 30 minutes without difficulty, it is safe to try more intense activity such as jogging, treadmill, bicycling, low-impact aerobics, swimming, etc. b. Save the most intensive and strenuous activity for last such as sit-ups, heavy lifting, contact sports, etc  Refrain from any heavy lifting or straining until you are off narcotics for pain control.   c. DO NOT PUSH THROUGH PAIN.  Let pain be your guide: If it hurts to do something, don't do it.  Pain is your body warning you to avoid that activity for another week until the pain goes  down. d. You may drive when you are no longer taking prescription pain medication, you can comfortably wear a seatbelt, and you can safely maneuver your car and apply brakes. e. Dennis Bast may have sexual intercourse when it is comfortable.  FOLLOW UP with the Peritoneal Dialysis nurses closely after surgery.  Call [phone] to help arrange  training/flushes of cathete    -The CAPD nurses & Nephrology usually follow you closely, making the need for follow-up in our office redundant and therefore not needed.  If they or you have concerns, please call us for possible follow-up in our office  -Please call [facility] at [phone] only as needed.  WHEN TO CALL us [phone]: 1. Poor pain control 2. Reactions / problems with new medications (rash/itching, nausea, etc)  3. Fever over 101.5 F (38.5 C) 4. Worsening swelling or bruising 5. Continued bleeding from incision. 6. Increased pain, redness, or drainage from the incision   The clinic staff is available to answer your questions during regular business hours (8:30am-5pm).  Please dont hesitate to call and ask to speak to one of our nurses for clinical concerns.   If you have a medical emergency, go to the nearest emergency room or call 911.  A surgeon from [facility] is always on call at the hospitals  9. IF YOU HAVE DISABILITY OR FAMILY LEAVE FORMS, BRING THEM TO THE OFFICE FOR PROCESSING.  DO NOT GIVE THEM TO YOUR DOCTOR.  [facility], [place], [place], [place], [place]  [address] ? MAIN: [phone]) [phone] ? TOLL FREE: [phone] ?  FAX [phone] [email or web address]  HERNIA REPAIR: POST OP INSTRUCTIONS  8. DIET: Follow a light bland diet the first 24 hours after arrival home, such as soup, liquids, crackers, etc.  Be sure to include lots of fluids daily.  Avoid fast food or heavy meals as your are more likely to get nauseated.  Eat a low fat the next few days after surgery. 9. Take your usually prescribed home medications unless otherwise directed. 10. PAIN CONTROL: a. Pain is best controlled by a usual combination of three different methods TOGETHER: i. Ice/Heat ii. Over the counter pain medication iii. Prescription pain medication b. Most patients will experience some swelling and bruising around the  hernia(s) such as the bellybutton, groins, or old incisions.  Ice packs or heating pads (30-60 minutes up to 6 times a day) will help. Use ice for the first few days to help decrease swelling and bruising, then switch to heat to help relax tight/sore spots and speed recovery.  Some people prefer to use ice alone, heat alone, alternating between ice & heat.  Experiment to what works for you.  Swelling and bruising can take several weeks to resolve.   c. It is helpful to take an over-the-counter pain medication regularly for the first few weeks.  Choose one of the following that works best for you: i. Naproxen (Aleve, etc)  Two 220mg  tabs twice a day ii. Ibuprofen (Advil, etc) Three 200mg  tabs four times a day (every meal & bedtime) iii. Acetaminophen (Tylenol, etc) 325-650mg  four times a day (every meal & bedtime) d. A  prescription for pain medication should be given to you upon discharge.  Take your pain medication as prescribed.  i. If you are having problems/concerns with the prescription medicine (does not control pain, nausea, vomiting, rash, itching, etc), please call us [phone] to see if we need to switch you to a different  pain medicine that will work better for you and/or control your side effect better. ii. If you need a refill on your pain medication, please contact your pharmacy.  They will contact our office to request authorization. Prescriptions will not be filled after 5 pm or on week-ends. 11. Avoid getting constipated.  Between the surgery and the pain medications, it is common to experience some constipation.  Increasing fluid intake and taking a fiber supplement (such as Metamucil, Citrucel, FiberCon, MiraLax, etc) 1-2 times a day regularly will usually help prevent this problem from occurring.  A mild laxative (prune juice, Milk of Magnesia, MiraLax, etc) should be taken according to package directions if there are no bowel movements after 48 hours.   12. Wash / shower every day.   You may shower over the dressings as they are waterproof.   13. Remove your waterproof bandages 5 days after surgery.  You may leave the incision open to air.  You may replace a dressing/Band-Aid to cover the incision for comfort if you wish.  Continue to shower over incision(s) after the dressing is off.    14. ACTIVITIES as tolerated:   a. You may resume regular (light) daily activities beginning the next day--such as daily self-care, walking, climbing stairs--gradually increasing activities as tolerated.  If you can walk 30 minutes without difficulty, it is safe to try more intense activity such as jogging, treadmill, bicycling, low-impact aerobics, swimming, etc. b. Save the most intensive and strenuous activity for last such as sit-ups, heavy lifting, contact sports, etc  Refrain from any heavy lifting or straining until you are off narcotics for pain control.   c. DO NOT PUSH THROUGH PAIN.  Let pain be your guide: If it hurts to do something, don't do it.  Pain is your body warning you to avoid that activity for another week until the pain goes down. d. You may drive when you are no longer taking prescription pain medication, you can comfortably wear a seatbelt, and you can safely maneuver your car and apply brakes. e. Dennis Bast may have sexual intercourse when it is comfortable.  15. FOLLOW UP in our office a. Please call [facility] at [phone] to set up an appointment to see your surgeon in the office for a follow-up appointment approximately 2-3 weeks after your surgery. b. Make sure that you call for this appointment the day you arrive home to insure a convenient appointment time. 9.  IF YOU HAVE DISABILITY OR FAMILY LEAVE FORMS, BRING THEM TO THE OFFICE FOR PROCESSING.  DO NOT GIVE THEM TO YOUR DOCTOR.  WHEN TO CALL us [phone]: 7. Poor pain control 8. Reactions / problems with new medications (rash/itching, nausea, etc)  9. Fever over 101.5 F (38.5 C) 10. Inability to  urinate 11. Nausea and/or vomiting 12. Worsening swelling or bruising 13. Continued bleeding from incision. 14. Increased pain, redness, or drainage from the incision   The clinic staff is available to answer your questions during regular business hours (8:30am-5pm).  Please dont hesitate to call and ask to speak to one of our nurses for clinical concerns.   If you have a medical emergency, go to the nearest emergency room or call 911.  A surgeon from [facility] is always on call at the hospitals in [facility], [place], [place], [place], [place]  [address] ?  [address] [address], [place], [place]   [address] MAIN: [phone] ? TOLL FREE: [phone] ? FAX: [phone] [email or web address]   Peritoneal Dialysis - An Overview Dialysis can be done using a machine outside of the body (hemodialysis). Or, it can be done inside the body (peritoneal dialysis). The word "peritoneal" refers to the lining or membrane of the belly (abdominal cavity). The peritoneal membrane is a thin, plastic-like lining inside the belly that covers the organs and fits in the abdominal or peritoneal cavity, such as the stomach, liver and the kidneys. This lining works like a filter. It will allow certain things to pass from your blood through the lining and into a special solution that has been placed into your belly. In this type of dialysis, the peritoneum is used to help clean the blood.  If you need dialysis, your kidneys are not working right. Healthy kidneys take out extra water and waste products, which becomes urine. When the kidneys do not do this, serious problems can develop. The waste and water build up in the blood. Your hands and feet might swell. You may feel tired, weak or sick to your stomach. Also, your blood pressure may rise. If not treated, you could die. Dialysis is a treatment that does the work that your kidneys would do if they were  healthy.  It cleans your blood.   It will make sure your body has the right amount of certain chemicals that it needs. They include potassium, sodium and bicarbonate.   It will help control your blood pressure.  UNDERSTANDING PERITONEAL DIALYSIS  Here is how peritoneal dialysis works:   First, you will have surgery to put a soft plastic tube (catheter) into your belly (abdomen). This will allow you to easily connect yourself to special tubing, which will then let a special dialysis solution to be placed into your abdomen.   For each treatment, you will need at least one bag of dialysis solution (a liquid called dialysate). It is a mix of water that is pure and free of germs (sterile), sugar (dextrose) and the nutrients and minerals found in your blood. Sometimes, more than one bag is needed to get the right amount of fluid for your abdomen. Your caregiver will explain what size and how many bags you will need.   The dialysate is slowly put through the catheter to fill the abdomen (called the peritoneal cavity). This dialysate will need to stay in your body for 3-4 hours. This is known as the dwell time.   The solution is working to clean the blood and remove wastes from your body. At the end of this time, the solution is drained from your body through tubing into an empty bag. It is then replaced with a fresh dialysate.   The draining and replacing of the dialysate is called an exchange or cycle. The catheter is capped after each exchange. Once the solution is in your body, you are then free to do whatever you would like until the next exchange. Most people will need to do 4-5 exchanges each day.   There are two different methods that can be used.   Continuous ambulatory peritoneal dialysis (CAPD): You put the solution into your abdomen, cap your catheter and then go about your day. Several hours later, you reconnect to a tubing set up, drain out the solution and then put more solution in.  This is done several times a day. No machine is needed.   Continuous cycler-assisted peritoneal dialysis (CCPD): A machine is used, which fills the abdomen with dialysate and then drains it. This happens several  times. It usually is done at night while you are sleeping. When you wake up, you can disconnect from the machine and are free to go to go about your day.  PREPARING FOR EXCHANGES  Discuss the details of the procedure with your caregivers. You will be working with a nurse who is specially trained in doing dialysis. Make sure you understand:   How to do an exchange.   How much solution you need.   What type of solution you will need.   How often you should do an exchange. Ask:   How many times each day?   When? At meals? At bedtime?   Always keep the dialysate bags and other supplies in a cool, clean and dry place.   Keeping everything clean is very important.   The catheter and its cap must be free from germs (sterile)   The adapter also must be sterile. It attaches the dialysis bag and tubing to the catheter.   Clean the area of your body around the catheter every day. Use a chemical that fights infection (antiseptic).   Wash your hands thoroughly before starting an exchange.   You may be taught to wear a mask to cover your nose and mouth. This makes infection less likely to happen.   You may be taught to close doors, windows and turn off any fans before doing an exchange.   Check the dialysate bag very carefully.   Make sure it is the right size bag for you. This information is on the label.   Also, make sure it is the right mixture. For some people, the dialysate contents vary. For instance, the mixture might be a stronger solution for overnight.   Check the expiration date (the last date you can use the bag). It also is on the label. If the date has gone by, throw away the bag.   The solution should be clear. You should be able to see any writing on the side of  the bag clearly through the solution. Do not use a cloudy solution.   Gently squeeze the bag to make sure there are no leaks.   Use a dry heating pad to warm the dialysate in the bag. Leave the cover on the bag while you do this.   This is for comfort. You can skip this step if you want.   Never place the bag of solution under warm or hot water. Water from a faucet is not sterile and could cause germs to get into the bag. Infection could then result.  PERFORMING AN EXCHANGE  For continuous ambulatory dialysis:   Attach the dialysis bag and tubing to your catheter. Hang the bag so that gravity (the natural downward pull) draws the solution down and into your abdomen once the clamps are opened. This should take about 10 minutes.   Remove the bag and tubing from the catheter. Cap the catheter.   The solution stays in the abdomen for 3-4 hours (dwell time). The solution is working to clean the blood and remove wastes from your body.   When you are ready to drain the solution for another exchange, take the cap off the catheter. Then, attach the catheter to tubing, which is attached to an empty bag. Place this empty bag below the abdomen or on the floor or stool and undo the clamps.   Gravity helps pull the fluid out of the abdomen and into the bag. The fluid in the bag may look yellow and  clear, like urine. It usually takes about 20 minutes to drain the fluid out of the abdomen.   When the solution has drained, start the process again by infusing a new bag of dialysate and then capping the catheter.   This should continue until you have used all of the solution that you are to use each day.   Sometimes, a small machine is used overnight. It is called a mini-cycler. This is done if the body cannot go all night without an exchange. The machine lets you sleep without having to get up and do an exchange.   For continuous cycler-assisted dialysis:   You will be taught how to set up or program  your machine.   When you are ready for bed, put the dialysate bags onto the cycler machine. Put on exactly the number of bags that your caregiver said to use.   Connect your catheter to the machine and turn the cycler machine on.   Overnight, the cycler will do several exchanges. It often does three to five, sometimes more.   Solution that is in your abdomen in the morning will stay during the day. The machine is set to make the daytime solution stronger, if that is needed.   In the morning, you will disconnect from the machine and cap your catheter and go about your day.   Sometimes, an extra exchange is done during the day. This may be needed to remove excess waste or fluid.  IMPORTANT REMINDERS  You will need to follow a very strict schedule. Every step of the dialysis procedure must be done every day. Sometimes, several times a day. Altogether, this might take an extra 2 hours or more. However, you must stick to the routine. Do not skip a day. Do not skip a procedure.   Some people find it helpful to work with a Social worker or Education officer, museum in addition to the renal (kidney) nurse. They can help you figure out how to change your daily routine to fit in the dialysis sessions.   You may need to change your diet. Ask your caregiver for advice, or talk with a nutritionist about what you should and should not eat.   You will need to weigh yourself every day and keep track of what your weight is.   You may be taught how to check your blood pressure before every exchange. Your blood pressure reading will help determine what type of solution to use. If your blood pressure is too high, you may need a stronger solution.  RISKS AND COMPLICATIONS  Possible problems vary, depending on the method you use. Your overall health also can have an effect. Problems that could develop because of dialysis include:  Infection. This is the most common problem. It could occur:   In the peritoneum. This is called  peritonitis.   Around the catheter.   Weight gain. The dialysate contains a type of sugar known as dextrose. Dextrose has a lot of calories. The body takes in several hundred calories from this sugar each day.   Weakened muscles in the abdomen. This can result from all of the fluid that your body has to hold in the abdomen.   Catheter replacement. Sometimes, a new one has to be put in.   Change in dialysis method. Due to some complications, you may need to change to hemodialysis for a short time and have your dialysis done at a center.   Trouble adjusting to your new lifestyle. In some people,  this leads to depression.   Sleep problems.   Dialysis-related amyloidosis. This sometimes occurs after 5 years of dialysis. Protein builds up in the blood. This can cause painful deposits on bones, joints and tendons (which connect muscle to bone). Or, it can cause hollow spots in bones that make them more likely to break.   Excess fluid. Your body may absorb too much of the fluid that is held in the abdomen. This can lead to heart or lung problems.  SEEK MEDICAL CARE IF:   You have any problems with an exchange.   The area around the catheter becomes red or painful.   The catheter seems loose, or it feels like it is coming out.   A bag of dialysate looks cloudy. Or, the liquid is an unusual color.   Abdominal pain or discomfort.   You feel sick to your stomach (nauseous) or throw up (vomit).   You develop a fever of more than 102 F (38.9 C).  SEEK IMMEDIATE MEDICAL CARE IF:  You develop a fever of more than 102 F (38.9 C). Document Released: 07/07/2009 Document Revised: 08/29/2011 Document Reviewed: 07/07/2009 [facility] Patient Information 2012 [place].  Diet for Peritoneal Dialysis This diet may be modified in protein, sodium, phosphorus, potassium, or fluid, depending on your needs. The goals of nutrition therapy are similar to those for patients on hemodialysis. Providing  enough protein to replace peritoneal losses is a priority. USES OF THIS DIET The diet is designed for the patient with end-stage kidney (renal) disease, who is treated by peritoneal dialysis. Treatment options include:  Continuous Ambulatory Peritoneal Dialysis (CAPD): Usually 4 exchanges of 1.5 to 2 liter volumes of glucose (sugar) and electrolyte-containing dialysate.   Continuous Cyclic Peritoneal Dialysis (CCPD): Essentially a reversal of CAPD, with shorter exchanges at night and a longer one during the day.   Intermittent Peritoneal Dialysis (IPD): 10 to 12 hours of exchanges, 2 to 3 times weekly.  ADEQUACY The diet may not meet the Recommended Dietary Allowances of the [employer] for calcium and ascorbic acid. Protein and water-soluble vitamin needs may be increased because of losses into the dialysate. Recommended daily supplements are the same as for hemodialysis patients. ASSESSMENT/DETERMINATION OF DIET Dietary needs will differ between patients. Parameters must be individualized. Protein  Guidelines: 1.2 to 1.3 gm/kg/day OR 1.5 gm/kg/day if patient is malnourished, catabolic, or has a protracted episode of peritonitis. A minimum of 50% of the protein intake should be of high biological value.   Goals: Meet protein requirements and replace dialysate losses while avoiding excessive accumulation of waste products. Achieve serum albumin greater than 3.5 g/dL.   Evaluate: Current nutritional status, serum albumin and BUN levels, presence of peritonitis.  Sodium  Guidelines: Usually 90 to 175 mEq (2000 to 4000 mg), but should be individualized.   Goals: Minimize complications of fluid imbalance.   Evaluate: Weight, blood pressure regulation, and presence of swelling (edema).  Potassium  Guidelines: Individualized; often not restricted, and may need to be supplemented.   Goals: Serum K+ levels between 4.0 to 5.0 mEq/L.   Evaluate: Serum K+ levels, usual intake of  K+, appetite.  Phosphorus  Guidelines: 800 to 1200 mg/day (the high protein intake results in a high obligatory P intake).   Goal: Serum P levels between 4.5 to 6.0 mg/dL.   Evaluate: Serum P levels, usual P intake, P-binding medications: type, number, dosage, distribution.  Fluids  Guidelines: Individualized - may not be restricted for all patients.  Goal: Minimize complications of fluid imbalance.   Evaluate: Weight, blood pressure regulation, sodium intake, and presence of edema.  Document Released: 09/09/2005 Document Revised: 08/29/2011 Document Reviewed: 12/02/2006 [facility] Patient Information 2012 [place].  Peritoneal Dialysis (CAPD) Catheter  Healthy kidneys remove excess water and waste products from the body in the form of urine. When the kidneys cannot do this, serious problems develop and a person can fall into kidney failure.   In most cases, the kidneys can heal and recover to a better place.  Sometimes the kidneys remain somewhat damaged and a kidney specialist (nephrologist) needs to help manage the disease with medications and adjustments in diet.    Sometimes the kidney failure has progressed too far.  The bodys waste and fluid build up in the blood. Hands and legs swell. You start to feel more weak or nauseated. Your blood pressure may rise, to seeing you at serious risk for heart attack and stroke.  If not treated, you will eventually die. Dialysis is a treatment that replaces the work that your kidneys would do if they were healthy to help keep you alive.  Dialysis can be done using a machine outside of the body (hemodialysis) connected to your blood; or, it can be done with a tube placed to enter your abdomen (peritoneal dialysis). The peritoneum is the inner lining of your abdominal cavity, covering the inner organs & abdominal wall.  This inner lining of peritoneum works like a filter.   Peritoneal dialysis involves placed several quarts of sterile fluid into  the inner abdomen/peritoneal cavity.  The waste products and fluids can exchange across the peritoneal membrane.  The fluid is later removed.  This most poor every day.  Sometimes the catheter can be attached to a machine that runs at night while you sleep (continuous cycler-assisted dialysis = CCPD).  Sometimes the fluid can be infused in her abdomen and then later removed hours later (continuous ambulatory peritoneal dialysis = CAPD).  If your nephrologist feels like you are a good candidate, you will be sent to be evaluated by a surgeon to consider placement of a peritoneal dialysis catheter.  Your nephrologist should have shown you educational videos .  You should discuss with dialysis nurses about what life with a peritoneal dialysis catheter is like.  Most patients who have not had many abdominal operations are reasonable candidates for placement of a peritoneal dialysis catheter   It is important to understand the risks and benefits of the procedure prior to undergoing surgery.    If your surgeon feels you are a reasonable candidate, you will have a peritoneal dialysis catheter placed.    This requires an operation under general anesthesia.  The surgeon places a soft plastic tube ([place] curl catheter) into your abdomen.  The deeper curled tip rests down in the pelvis.  The middle part is tunneled inside your abdominal wall where cuffs provide a water-tight seal.  This leaves an outer foot of plastic tubing resting outside your body, usually in your lower abdomen below the beltline near your waist.  This is a quick procedure that can often be done as an outpatient with the possibility of staying overnight.  It takes several weeks for the catheter to heal into a watertight seal.  Initially, your nephrologist we will have the dialysis nurses do gentle flushes through the catheter.  Once the catheter is well sealed in, you will begin larger exchanges of fluid to do full peritoneal dialysis.  Your  nephrologist and  dialysis nurses will help guide you through this.  While placement of the catheter is usually not technically difficult, there are risks to the procedure.  The biggest risk is infection.  This is why we place the catheter in the operating room with the use of intravenous antibiotics.  Using sterile technique to hook up the catheter to your dialysis fluids is essential.  Most infections of the catheter are mild and can be treated with antibiotics placed in the vein were placed with the peritoneal fluid.  However, sometimes the infection worsens or persists and the catheter needs to be removed.  Occasionally, the catheters can be blocked due to inner organs plugging up the tubing or kinks.  Sometimes the catheter leaks and needs to be replaced.  Sometimes the inner abdomen has too many adhesions from prior surgeries or infections and there is not enough space for fluid exchanges to occur.  Sometimes, peritoneal dialysis is not enough to compensate for the kidney failure.   In rare instances, the inner lining of the abdomen becomes severely thickened or inflamed and peritoneal dialysis can no longer work.  Sometimes it is not possible to safely replace a new catheter and the patient must go on hemodialysis permanently.  It is very common to need hemodialysis intermittently even if you have a peritoneal dialysis catheter in cases of emergencies or if peritoneal dialysis fails.  Your nephrologist and surgeon can help you decide what the best form of dialysis is for you  Managing Pain  Pain after surgery or related to activity is often due to strain/injury to muscle, tendon, nerves and/or incisions.  This pain is usually short-term and will improve in a few months.   Many people find it helpful to do the following things TOGETHER to help speed the process of healing and to get back to regular activity more quickly:  1. Avoid heavy physical activity a.  no lifting greater than 20 pounds b. Do  not push through the pain.  Listen to your body and avoid positions and maneuvers than reproduce the pain c. Walking is okay as tolerated, but go slowly and stop when getting sore.  d. Remember: If it hurts to do it, then dont do it! 2. Take Anti-inflammatory medication  a. Take with food/snack around the clock for 1-2 weeks i. This helps the muscle and nerve tissues become less irritable and calm down faster ii. Choose Acetaminophen 500mg  tabs (Tylenol) 1-2 pills with every meal and just before bedtime 3. Use a Heating pad or Ice/Cold Pack a. 4-6 times a day b. May use warm bath/hottub  or showers 4. Try Gentle Massage and/or Stretching  a. at the area of pain many times a day b. stop if you feel pain - do not overdo it  Try these steps together to help you body heal faster and avoid making things get worse.  Doing just one of these things may not be enough.    If you are not getting better after two weeks or are noticing you are getting worse, contact our office for further advice; we may need to re-evaluate you & see what other things we can do to help.  GETTING TO GOOD BOWEL HEALTH. Irregular bowel habits such as constipation and diarrhea can lead to many problems over time.  Having one soft bowel movement a day is the most important way to prevent further problems.  The anorectal canal is designed to handle stretching and feces to safely manage our ability to  get rid of solid waste (feces, poop, stool) out of our body.  BUT, hard constipated stools can act like ripping concrete bricks and diarrhea can be a burning fire to this very sensitive area of our body, causing inflamed hemorrhoids, anal fissures, increasing risk is perirectal abscesses, abdominal pain/bloating, an making irritable bowel worse.     The goal: ONE SOFT BOWEL MOVEMENT A DAY!  To have soft, regular bowel movements:    Drink at least 8 tall glasses of water a day.     Take plenty of fiber.  Fiber is the undigested part  of plant food that passes into the colon, acting s natures broom to encourage bowel motility and movement.  Fiber can absorb and hold large amounts of water. This results in a larger, bulkier stool, which is soft and easier to pass. Work gradually over several weeks up to 6 servings a day of fiber (25g a day even more if needed) in the form of: o Vegetables -- Root (potatoes, carrots, turnips), leafy green (lettuce, salad greens, celery, spinach), or cooked high residue (cabbage, broccoli, etc) o Fruit -- Fresh (unpeeled skin & pulp), Dried (prunes, apricots, cherries, etc ),  or stewed ( applesauce)  o Whole grain breads, pasta, etc (whole wheat)  o Bran cereals    Bulking Agents -- This type of water-retaining fiber generally is easily obtained each day by one of the following:  o Psyllium bran -- The psyllium plant is remarkable because its ground seeds can retain so much water. This product is available as Metamucil, Konsyl, Effersyllium, Per Diem Fiber, or the less expensive generic preparation in drug and health food stores. Although labeled a laxative, it really is not a laxative.  o Methylcellulose -- This is another fiber derived from wood which also retains water. It is available as Citrucel. o Polyethylene Glycol - and artificial fiber commonly called Miralax or Glycolax.  It is helpful for people with gassy or bloated feelings with regular fiber o Flax Seed - a less gassy fiber than psyllium   No reading or other relaxing activity while on the toilet. If bowel movements take longer than 5 minutes, you are too constipated   AVOID CONSTIPATION.  High fiber and water intake usually takes care of this.  Sometimes a laxative is needed to stimulate more frequent bowel movements, but    Laxatives are not a good long-term solution as it can wear the colon out. o Osmotics (Milk of Magnesia, Fleets phosphosoda, Magnesium citrate, MiraLax, GoLytely) are safer than  o Stimulants (Senokot, Castor  Oil, Dulcolax, Ex Lax)    o Do not take laxatives for more than 7days in a row.    IF SEVERELY CONSTIPATED, try a Bowel Retraining Program: o Do not use laxatives.  o Eat a diet high in roughage, such as bran cereals and leafy vegetables.  o Drink six (6) ounces of prune or apricot juice each morning.  o Eat two (2) large servings of stewed fruit each day.  o Take one (1) heaping tablespoon of a psyllium-based bulking agent twice a day. Use sugar-free sweetener when possible to avoid excessive calories.  o Eat a normal breakfast.  o Set aside 15 minutes after breakfast to sit on the toilet, but do not strain to have a bowel movement.  o If you do not have a bowel movement by the third day, use an enema and repeat the above steps.    Controlling diarrhea o Switch to liquids and simpler  foods for a few days to avoid stressing your intestines further. o Avoid dairy products (especially milk & ice cream) for a short time.  The intestines often can lose the ability to digest lactose when stressed. o Avoid foods that cause gassiness or bloating.  Typical foods include beans and other legumes, cabbage, broccoli, and dairy foods.  Every person has some sensitivity to other foods, so listen to our body and avoid those foods that trigger problems for you. o Adding fiber (Citrucel, Metamucil, psyllium, Miralax) gradually can help thicken stools by absorbing excess fluid and retrain the intestines to act more normally.  Slowly increase the dose over a few weeks.  Too much fiber too soon can backfire and cause cramping & bloating. o Probiotics (such as active yogurt, Align, etc) may help repopulate the intestines and colon with normal bacteria and calm down a sensitive digestive tract.  Most studies show it to be of mild help, though, and such products can be costly. o Medicines:   Bismuth subsalicylate (ex. Kayopectate, Pepto Bismol) every 30 minutes for up to 6 doses can help control diarrhea.  Avoid if  pregnant.   Loperamide (Immodium) can slow down diarrhea.  Start with two tablets (4mg  total) first and then try one tablet every 6 hours.  Avoid if you are having fevers or severe pain.  If you are not better or start feeling worse, stop all medicines and call your doctor for advice o Call your doctor if you are getting worse or not better.  Sometimes further testing (cultures, endoscopy, X-ray studies, bloodwork, etc) may be needed to help diagnose and treat the cause of the diarrhea.  What to eat:  For your first meals, you should eat lightly; only small meals initially.  If you do not have nausea, you may eat larger meals.  Avoid spicy, greasy and heavy food.    General Anesthesia, Adult, Care After  Refer to this sheet in the next few weeks. These instructions provide you with information on caring for yourself after your procedure. Your health care provider may also give you more specific instructions. Your treatment has been planned according to current medical practices, but problems sometimes occur. Call your health care provider if you have any problems or questions after your procedure.  WHAT TO EXPECT AFTER THE PROCEDURE  After the procedure, it is typical to experience:  Sleepiness.  Nausea and vomiting. HOME CARE INSTRUCTIONS  For the first 24 hours after general anesthesia:  Have a responsible person with you.  Do not drive a car. If you are alone, do not take public transportation.  Do not drink alcohol.  Do not take medicine that has not been prescribed by your health care provider.  Do not sign important papers or make important decisions.  You may resume a normal diet and activities as directed by your health care provider.  Change bandages (dressings) as directed.  If you have questions or problems that seem related to general anesthesia, call the hospital and ask for the anesthetist or anesthesiologist on call. SEEK MEDICAL CARE IF:  You have nausea and vomiting that  continue the day after anesthesia.  You develop a rash. SEEK IMMEDIATE MEDICAL CARE IF:  You have difficulty breathing.  You have chest pain.  You have any allergic problems. Document Released: 12/16/2000 Document Revised: 05/12/2013 Document Reviewed: 03/25/2013  [facility] Patient Information 2014 [place], [place].

## 2013-12-14 NOTE — Anesthesia Procedure Notes (Signed)
Procedure Name: Intubation Date/Time: 12/14/2013 11:40 AM Performed by: Wanita Chamberlain Pre-anesthesia Checklist: Patient being monitored, Suction available, Emergency Drugs available, Patient identified and Timeout performed Patient Re-evaluated:Patient Re-evaluated prior to inductionOxygen Delivery Method: Circle system utilized Preoxygenation: Pre-oxygenation with 100% oxygen Intubation Type: IV induction Ventilation: Mask ventilation without difficulty Laryngoscope Size: Mac and 3 Grade View: Grade I Tube type: Oral Number of attempts: 1 Airway Equipment and Method: Stylet Placement Confirmation: ETT inserted through vocal cords under direct vision,  positive ETCO2 and breath sounds checked- equal and bilateral Secured at: 20 cm Tube secured with: Tape Dental Injury: Teeth and Oropharynx as per pre-operative assessment

## 2013-12-14 NOTE — Anesthesia Postprocedure Evaluation (Signed)
Anesthesia Post Note  Patient: Kimberly [name]  Procedure(s) Performed: Procedure(s) (LRB): LAPAROSCOPIC INSERTION CONTINUOUS AMBULATORY PERITONEAL DIALYSIS  (CAPD) CATHETER (N/A) INSERTION OF MESH (N/A) LAPAROSCOPIC LYSIS OF ADHESIONS (N/A) LAPAROSCOPIC VENTRAL HERNIA (N/A)  Anesthesia type: General  Patient location: PACU  Post pain: Pain level controlled  Post assessment: Patient's Cardiovascular Status Stable  Last Vitals:  Filed Vitals:   12/14/13 1430  BP:   Pulse: 68  Temp:   Resp: 20    Post vital signs: Reviewed and stable  Level of consciousness: alert  Complications: No apparent anesthesia complications

## 2013-12-14 NOTE — Preoperative (Signed)
Beta Blockers   Reason not to administer Beta Blockers:Not Applicable 

## 2013-12-14 NOTE — Op Note (Signed)
12/14/2013  2:15 PM  PATIENT:  Kimberly [name]  58 y.o. female  Patient Care Team: Reginia Naas, MD as PCP - General (Family Medicine) Thompson Grayer, MD as Attending Physician (Cardiology) Louis Meckel, MD as Consulting Physician (Nephrology) Christy Sartorius as Consulting Physician (Urology) Audree Bane as Consulting Physician (Urology) Elayne Snare, MD as Consulting Physician (Endocrinology)  PRE-OPERATIVE DIAGNOSIS:  Renal disease  POST-OPERATIVE DIAGNOSIS:  CKD Renal disease with need for dialysis VWHernia  PROCEDURE:  Procedure(s):  LAPAROSCOPIC LYSIS OF ADHESIONS LAPAROSCOPIC VENTRAL HERNIA REPAIR W MESH LAPAROSCOPIC INSERTION CONTINUOUS AMBULATORY PERITONEAL DIALYSIS  (CAPD) CATHETER INSERTION OF MESH  SURGEON:  Surgeon(s): [name], MD  ASSISTANT: RNFA   ANESTHESIA:   local and general  EBL:  Total I/O In: 500 [I.V.:500] Out: 100 [Blood:100]  Delay start of Pharmacological VTE agent (>24hrs) due to surgical blood loss or risk of bleeding:  no  DRAINS:    SPECIMEN:  No Specimen  DISPOSITION OF SPECIMEN:  N/A  COUNTS:  YES  PLAN OF CARE: Discharge to home after PACU  PATIENT DISPOSITION:  PACU - hemodynamically stable.  INDICATION: Pleasant woman with progressive kidney disease to renal failure.  Underwent peritoneal dialysis for several years.  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  It has been gradually rejecting.  Need for dialysis seems imminent.  Requested to retry peritoneal dialysis.  The anatomy & physiology of peritoneum was discussed.  Natural history risks without surgery of worsening renal failure was discussed.   I feel the risks of no intervention will lead to serious problems that outweigh the operative risks; therefore, I recommended placement of a peritoneal dialysis catheter.  I explained laparoscopic techniques with possible need for an open approach.    Risks such as bleeding, infection, abscess, injury to other organs,  catheter occlusion or malpositioning, reoperation to remove/reposition the catheter, heart attack, death, and other risks were discussed.   I noted a good likelihood this will help address the problem.  Possibility that this will not be enough to compensate for the renal failure & need for further treatment such as hemodialysis was explained.  Goals of post-operative recovery were discussed as well.  We will work to minimize complications.   The patient is/will be getting training on catheter use by dialysis nursing before and after surgery.  I stressed the importance of meticulous care & sterile technique to prevent catheter problems.  Questions were answered.  The patient expresses understanding & wishes to proceed with surgery.  OR FINDINGS:   It is a long tunneled CAPD peritoneal dialysis catheter ([employer] [id number] curl cath with titanium extender) .  The curl tip of the catheter rests in the deep pelvis.   Entry into the peritoneum it is in the left suprapubic region just above the dome of the bladder.  Deepest cuff in the preperitoneal space at this location.  The exit site of the catheter on the skin is in the left upper abdomen subcostal region, 5 cm inferior to costal ridge, lateral clavicular line.  She had 2 x 2 centimeter incisional hernia with a 1 x 1 cm smaller pocket superiorly in the periumbilical region.  She had dense adhesions of small bowel to the pelvis anterior abdominal wall and between loops.  No definite obstructive point.  DESCRIPTION:   Informed consent was confirmed.  The patient underwent general anaesthesia without difficulty.  The patient was positioned appropriately.  VTE prevention in place.  The patient's abdomen was clipped, prepped, & draped in a  sterile fashion.  Surgical timeout confirmed our plan.  The patient was positioned in reverse Trendelenburg.  Abdominal entry was gained using optical entry technique in the right upper abdomen.  Entry was clean.  I  induced carbon dioxide insufflation.  Camera inspection revealed no injury.  Extra ports were carefully placed under direct laparoscopic visualization.  She had moderate adhesions of omentum especially small intestine to the mid and lower abdomen.  I carefully freed these off using primarily cold scissors.  It took some time to free the bowel out of the pelvis and in between loops.  I ran the small bowel twice and relieve any interloop adhesions and folds.  No evidence of enterotomy or other injury.  Epistasis was good.   I proceeded with placement of the peritoneal dialysis catheter.   I tunneled a 41mm dilating port obliquely through the abdominal wall from a left lower quadrant paramedian skin incision inferiorly, splitting the left infraumbililcal rectus muscle to exit into the peritoneum just cephalad to the dome of the bladder.  I placed the CAPD catheter through that port.  The curl of the CAPD catheter rested down in the cul de sac of the deep pelvis.   It flushed & aspirated 242mL heparinized saline well.  The deep cuff rested in the pre-peritoneal space.  The Port was removed to allow the external end to exit the skin of the port site.  I connected the upper double-cuff extra long catheter extention tubing using the [employer] titanium extender connector.    I tunneled the extended catheter using a long vascular tunneler with a blunt bullet-screw-on tip tunneler.  I tunnelled the catheter from the LLQ 7mm port exit site though the abdominal wall out the abdominal wall in the medial clavicular line 3 cm from the costal ridge .  The titanium connector was buried in the left perimedian periumbilical region of the abdominal wall with that tunneling done.  I left the proximal superficial cuff just under the skin inferior to that incision.  I used a Dispensing optician ([employer] FS-402, 50 degree bend) spike-tip tunneler to tunnel the remaining tubing inferiorly to exit the skin at the left lateral clavicular  line, 5 cm inferior to the costal ridge.  The most distal cuff rests superior to the final exit site in the SQ.       At completion, I could see some 1-2cm swiss cheesee ventral hernias.  I did repair them with mesh.  I used a 15 x 10 cm Bard dual sided mesh (polypropylene/Seprafilm).  I placed it in the abdomen throught eh 2cm hernia.  I secured the mesh vertically to the anterior abdominal wall using 0 Prolene preloaded stitches x8 around the periphery.  I took meticulous care to keep the mesh medial to the tunneled catheter in left lateral paramedian region.  I used a tacker around the edges and centrally.  She had extremely thin abdominal wall.  Hemostasis was excellent.  The catheter flushed and aspirated easily with heparinized saline into the CAPD catheter.  I removed the ports.  I closed the skin using 4-0 monocryl stitch.  Sterile dressings were applied. The patient was extubated & arrived in the PACU in stable condition.  I had discussed postoperative care with the patient in the holding area. I am about to locate the patient's family and discuss operative findings and postoperative goals / instructions.  Instructions are written in the chart as well.  The patient will require close followup for flushing/management of a  peritoneal dialysis catheter through [facility].

## 2013-12-14 NOTE — Interval H&P Note (Signed)
History and Physical Interval Note:  12/14/2013 11:15 AM  [place]  has presented today for surgery, with the diagnosis of Renal disease  The various methods of treatment have been discussed with the patient and family. After consideration of risks, benefits and other options for treatment, the patient has consented to  Procedure(s): [place]  (CAPD) CATHETER, LYSIS OF ADHESION, POSSIBLE HERNIA REPAIR (N/A) as a surgical intervention .  The patient's history has been reviewed, patient examined, no change in status, stable for surgery.  I have reviewed the patient's chart and labs.  Questions were answered to the patient's satisfaction.     Michaeljames Milnes C.

## 2013-12-14 NOTE — Transfer of Care (Signed)
Immediate Anesthesia Transfer of Care Note  Patient: Kimberly [name]  Procedure(s) Performed: Procedure(s): LAPAROSCOPIC INSERTION CONTINUOUS AMBULATORY PERITONEAL DIALYSIS  (CAPD) CATHETER (N/A) INSERTION OF MESH (N/A) LAPAROSCOPIC LYSIS OF ADHESIONS (N/A) LAPAROSCOPIC VENTRAL HERNIA (N/A)  Patient Location: PACU  Anesthesia Type:General  Level of Consciousness: awake, alert , oriented and patient cooperative  Airway & Oxygen Therapy: Patient Spontanous Breathing and Patient connected to nasal cannula oxygen  Post-op Assessment: Report given to PACU RN and Post -op Vital signs reviewed and stable  Post vital signs: Reviewed and stable  Complications: No apparent anesthesia complications

## 2013-12-14 NOTE — Anesthesia Preprocedure Evaluation (Addendum)
Anesthesia Evaluation  Patient identified by MRN, date of birth, ID band Patient awake    Reviewed: Allergy & Precautions, H&P , NPO status , Patient's Chart, lab work & pertinent test results, reviewed documented beta blocker date and time   Airway Mallampati: II TM Distance: >3 FB Neck ROM: full    Dental  (+) Dental Advisory Given, Caps, Teeth Intact,    Pulmonary asthma ,  + Chronic "annoying cough"  ? reflux breath sounds clear to auscultation        Cardiovascular hypertension, On Medications + dysrhythmias Rhythm:regular Rate:Normal     Neuro/Psych CVA, No Residual Symptoms negative psych ROS   GI/Hepatic negative GI ROS, Neg liver ROS, GERD-  Medicated and Controlled,  Endo/Other  diabetes, Type 2, Oral Hypoglycemic AgentsHypothyroidism   Renal/GU Renal disease  negative genitourinary   Musculoskeletal negative musculoskeletal ROS (+)   Abdominal   Peds  Hematology  (+) anemia ,   Anesthesia Other Findings See surgeon's H&P   Reproductive/Obstetrics negative OB ROS                        Anesthesia Physical Anesthesia Plan  ASA: III  Anesthesia Plan: General   Post-op Pain Management:    Induction: Intravenous  Airway Management Planned: Oral ETT  Additional Equipment:   Intra-op Plan:   Post-operative Plan: Extubation in OR  Informed Consent: I have reviewed the patients History and Physical, chart, labs and discussed the procedure including the risks, benefits and alternatives for the proposed anesthesia with the patient or authorized representative who has indicated his/her understanding and acceptance.   Dental Advisory Given  Plan Discussed with: CRNA and Surgeon  Anesthesia Plan Comments:         Anesthesia Quick Evaluation

## 2013-12-14 NOTE — H&P (View-Only) (Signed)
Subjective:     Patient ID: Kimberly [name], female   DOB: [birth year], 57 y.o.   MRN: [id number]  HPI  Note: This dictation was prepared with Dragon/digital dictation along with [facility] technology. Any transcriptional errors that result from this process are unintentional.       Kimberly [name]  March 24, 1956 [id number]  Patient Care Team: Reginia Naas, MD as PCP - General (Family Medicine) Thompson Grayer, MD as Attending Physician (Cardiology) Louis Meckel, MD as Consulting Physician (Nephrology) Christy Sartorius as Consulting Physician (Urology) Audree Bane as Consulting Physician (Urology) Elayne Snare, MD as Consulting Physician (Endocrinology)  This patient is a 57 y.o.female who presents today for surgical evaluation at the request of Dr. Moshe Cipro.   Reason for visit: Recurrent kidney failure.  Request for peritoneal dialysis  Pleasant active woman.  History of IgA nephropathy progressing to kidney failure.  Had peritoneal dialysis catheter placed and 2009.  Worked well.  Develop small umbilical hernia.  Repaired with mesh 2011.  Underwent cadaveric kidney transplantation [facility] 2011.  This was complicated by strictures as a ureteral anastomosis.  Nephrostomy tube placement.  Cystoscopies.  Ureteral revision.  Ultimate kidney failure/projection.  Gradual process.  Fill need for dialysis is evident.  She adamantly wishes to avoid hemodialysis.  Therefore, peritoneal dialysis catheter replacement requested.  Relatively active.  She works.  Can walk a half hour without difficulty.  One episode of cardiac abnormal rhythm but otherwise no major cardiopulmonary issues.  She is not oliguric.  Has a bowel movement every day.  Has gradually lost some weight.  Energy level okay.  No history of any wound/abscess infections with her operations that she can recall.  Patient Active Problem List   Diagnosis Date Noted  . Secondary hyperparathyroidism  (of renal origin) 04/20/2013  . PVC's (premature ventricular contractions) 01/17/2012  . Stroke 11/29/2011  . DM type 2 causing complication 123456  . Kidney transplanted   . IgA nephropathy   . GERD 12/07/2007  . COUGH 11/09/2007  . HYPOTHYROIDISM 10/12/2007  . ANEMIA 10/12/2007  . HYPERTENSION 10/12/2007  . CARDIAC ARRHYTHMIA 10/12/2007  . ASTHMA 10/12/2007  . IGA NEPHROPATHY 10/12/2007  . CKD (chronic kidney disease) stage 4, GFR 15-29 ml/min 10/12/2007    Past Medical History  Diagnosis Date  . Stroke October 2010, March 2013  . Kidney transplanted 01/2010    [facility], has had some rejection  . Hypertension   . Diabetes mellitus   . IgA nephropathy     perviously on peritoneal dialysis  . Hypothyroidism   . Anemia   . GERD (gastroesophageal reflux disease)   . Immunosuppression   . First degree AV block   . Premature atrial beat   . Premature ventricular beat   . Shingles 03/2011    Past Surgical History  Procedure Laterality Date  . Kidney transplant  01/2010    [facility]  . Ureter revision  04/2011    [facility]  . Umbilical hernia repair  10/25/2009    open with mesh,  Dr [name]  . Capd insertion  08/25/2008    open, Dr [name]  . Capd removal  03/22/2010    Dr [name]    History   Social History  . Marital Status: Married    Spouse Name: N/A    Number of Children: N/A  . Years of Education: N/A   Occupational History  . Not on file.   Social History Main Topics  . Smoking status: Never  Smoker   . Smokeless tobacco: Never Used  . Alcohol Use: No  . Drug Use: No  . Sexual Activity: Yes    Birth Control/ Protection: Post-menopausal   Other Topics Concern  . Not on file   Social History Narrative   Pt lives in [place].  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    Family History  Problem Relation Age of Onset  . Coronary artery disease Mother     Current Outpatient Prescriptions  Medication Sig Dispense Refill  .  aspirin 81 MG tablet Take 81 mg by mouth daily.      . calcitRIOL (ROCALTROL) 0.5 MCG capsule Take one capsule by mouth daily, plus an extra capsule three times a week  129 capsule  1  . cholecalciferol (VITAMIN D) 1000 UNITS tablet Take 2,000 Units by mouth daily.      Marland Kitchen levothyroxine (SYNTHROID, LEVOTHROID) 50 MCG tablet Take 50 mcg by mouth daily before breakfast. DAW      . magnesium 30 MG tablet Take 250 mg by mouth 2 (two) times daily.      . mycophenolate (MYFORTIC) 180 MG EC tablet Take 180 mg by mouth 3 (three) times daily.      . predniSONE (STERAPRED UNI-PAK) 5 MG TABS Take 5 mg by mouth daily.      . tacrolimus (PROGRAF) 1 MG capsule Take 1-2 mg by mouth 2 (two) times daily. 2 mg in the am and 1mg  at night      . TRADJENTA 5 MG TABS tablet 5 mg.       No current facility-administered medications for this visit.     Allergies  Allergen Reactions  . Dapsone Rash    Pain in feet  . Pregabalin Rash    Face rash;red.  . Sulfonamide Derivatives Rash    BP 118/70  Pulse 72  Temp(Src) 98.8 F (37.1 C) (Oral)  Resp 14  Ht 5\' 6"  (1.676 m)  Wt 102 lb (46.267 kg)  BMI 16.47 kg/m2  No results found.   Review of Systems  Constitutional: Positive for unexpected weight change. Negative for fever, chills, diaphoresis, appetite change and fatigue.  HENT: Negative for ear discharge, ear pain, sore throat and trouble swallowing.   Eyes: Negative for photophobia, discharge and visual disturbance.  Respiratory: Positive for cough. Negative for choking, chest tightness and shortness of breath.   Cardiovascular: Positive for palpitations. Negative for chest pain.  Gastrointestinal: Negative for nausea, vomiting, abdominal pain, diarrhea, constipation, anal bleeding and rectal pain.  Endocrine: Negative for cold intolerance and heat intolerance.  Genitourinary: Negative for dysuria, urgency, frequency, vaginal bleeding, vaginal discharge, difficulty urinating and pelvic pain.    Musculoskeletal: Negative for gait problem, myalgias and neck pain.  Skin: Negative for color change, pallor and rash.  Allergic/Immunologic: Negative for environmental allergies, food allergies and immunocompromised state.  Neurological: Negative for dizziness, speech difficulty, weakness and numbness.  Hematological: Negative for adenopathy. Bruises/bleeds easily.  Psychiatric/Behavioral: Negative for confusion and agitation. The patient is not nervous/anxious.        Objective:   Physical Exam  Constitutional: She is oriented to person, place, and time. She appears well-developed. She appears cachectic. She is cooperative. No distress.  HENT:  Head: Normocephalic.  Mouth/Throat: Oropharynx is clear and moist. No oropharyngeal exudate.  Eyes: Conjunctivae and EOM are normal. Pupils are equal, round, and reactive to light. No scleral icterus.  Neck: Normal range of motion. Neck supple. No tracheal deviation present.  Cardiovascular: Normal rate, regular rhythm and intact distal pulses.   Pulmonary/Chest: Effort normal and breath sounds normal. No stridor. No respiratory distress. She exhibits no tenderness.  Abdominal: Soft. She exhibits no distension and no mass. There is no tenderness. There is no rigidity, no rebound, no guarding, no tenderness at McBurney's point and negative Murphy's sign. No hernia. Hernia confirmed negative in the ventral area, confirmed negative in the right inguinal area and confirmed negative in the left inguinal area.    Genitourinary: No vaginal discharge found.  Musculoskeletal: Normal range of motion. She exhibits no tenderness.       Right elbow: She exhibits normal range of motion.       Left elbow: She exhibits normal range of motion.       Right wrist: She exhibits normal range of motion.       Left wrist: She exhibits normal range of motion.       Right hand: Normal strength noted.       Left hand: Normal strength noted.  Lymphadenopathy:        Head (right side): No posterior auricular adenopathy present.       Head (left side): No posterior auricular adenopathy present.    She has no cervical adenopathy.    She has no axillary adenopathy.       Right: No inguinal adenopathy present.       Left: No inguinal adenopathy present.  Neurological: She is alert and oriented to person, place, and time. No cranial nerve deficit. She exhibits normal muscle tone. Coordination normal.  Skin: Skin is warm and dry. No rash noted. She is not diaphoretic. No erythema.  Psychiatric: She has a normal mood and affect. Her behavior is normal. Judgment and thought content normal.       Assessment:     IgA nephropathy with recurrent kidney failure/CKDisease status post failing kidney transplant.  Request for peritoneal dialysis     Plan:     While she has prior abdominal surgeries, I think it is reasonable for diagnostic laparoscopy and see if a peritoneal dialysis catheter can be safely placed.  She is right-handed.  Kidney transplant on the right.  Therefore, and despite placement of the peritoneal dialysis catheter on the left side.  I did discuss with her.  She is used peritoneal dialysis for 2 years & feels comfortable with the process.  I did caution her that it is highly likely she will need hemodialysis at some point in her life, but it is reasonable to do the peritoneal dialysis catheter and see if this will be successful for her until she can get a kidney transplant:  The anatomy & physiology of peritoneum was discussed.  Natural history risks without surgery of worsening renal failure was discussed.   I feel the risks of no intervention will lead to serious problems that outweigh the operative risks; therefore, I recommended placement of a peritoneal dialysis catheter.  I explained laparoscopic techniques with possible need for an open approach.    Risks such as bleeding, infection, abscess, injury to other organs, catheter occlusion or  malpositioning, reoperation to remove/reposition the catheter, heart attack, death, and other risks were discussed.   I noted a good likelihood this will help address the problem.  Possibility that this will not be enough to compensate for the renal failure & need for further treatment such as hemodialysis was explained.  Goals of post-operative recovery were discussed as well.  We will work to minimize  complications.   The patient is/will be getting training on catheter use by dialysis nursing before and after surgery.  I stressed the importance of meticulous care & sterile technique to prevent catheter problems.  Questions were answered.  The patient expresses understanding & wishes to proceed with surgery.

## 2013-12-17 ENCOUNTER — Encounter (HOSPITAL_COMMUNITY): Payer: Self-pay | Admitting: Surgery

## 2014-01-05 ENCOUNTER — Encounter (INDEPENDENT_AMBULATORY_CARE_PROVIDER_SITE_OTHER): Payer: BC Managed Care – PPO | Admitting: Surgery

## 2014-01-10 ENCOUNTER — Telehealth (INDEPENDENT_AMBULATORY_CARE_PROVIDER_SITE_OTHER): Payer: Self-pay

## 2014-01-10 NOTE — Telephone Encounter (Signed)
I defer to her nephrologist on any CAPD infection workup

## 2014-01-10 NOTE — Telephone Encounter (Signed)
Pt is s/p PD catheter insertion by Dr. Johney Maine on 12/14/13. Dialysis nurse calling for the patient to report pt has had temp of 99.0 for the last three days on Tylenol.  Pt is concerned.  Spoke with the patient myself. She did have a bacterial infection of the blood several years ago, and wondered if she would need any blood work done.  I recommended she contact her PCP tomorrow and ask him what his thoughts are.  I will relay her concerns to Dr. Johney Maine.

## 2014-01-11 NOTE — Telephone Encounter (Signed)
Called to speak to [facility] that called yesterday about the pt but she was not in today. I [facility] with [facility] to relay Dr Clyda Greener message that he would defer to the nephrologist on any CAPD infection workup. Alyse Low will get a hold of the pt to let her know this information.

## 2014-01-14 ENCOUNTER — Ambulatory Visit (INDEPENDENT_AMBULATORY_CARE_PROVIDER_SITE_OTHER): Payer: BC Managed Care – PPO | Admitting: Surgery

## 2014-01-14 ENCOUNTER — Encounter (INDEPENDENT_AMBULATORY_CARE_PROVIDER_SITE_OTHER): Payer: Self-pay | Admitting: Surgery

## 2014-01-14 VITALS — BP 132/80 | HR 78 | Temp 98.2°F | Resp 14 | Ht 66.0 in | Wt 100.0 lb

## 2014-01-14 DIAGNOSIS — Z992 Dependence on renal dialysis: Secondary | ICD-10-CM | POA: Insufficient documentation

## 2014-01-14 DIAGNOSIS — K439 Ventral hernia without obstruction or gangrene: Secondary | ICD-10-CM

## 2014-01-14 NOTE — Patient Instructions (Signed)
Please consider the recommendations that we have given you today:  I believe the abdominal soreness on the right lower side it is pulling from the mesh from your hernia repair.  It should continue to improve.  Consider Tylenol and heat round-the-clock for the next 2 weeks and then as needed.  Followup with your nephrologists/primary care physician concerning her issues of fever  See the Handout(s) we have given you.  Please call our office at [phone] if you wish to schedule surgery or if you have further questions / concerns.   HERNIA REPAIR: POST OP INSTRUCTIONS  1. DIET: Follow a light bland diet the first 24 hours after arrival home, such as soup, liquids, crackers, etc.  Be sure to include lots of fluids daily.  Avoid fast food or heavy meals as your are more likely to get nauseated.  Eat a low fat the next few days after surgery. 2. Take your usually prescribed home medications unless otherwise directed. 3. PAIN CONTROL: a. Pain is best controlled by a usual combination of three different methods TOGETHER: i. Ice/Heat ii. Over the counter pain medication iii. Prescription pain medication b. Most patients will experience some swelling and bruising around the hernia(s) such as the bellybutton, groins, or old incisions.  Ice packs or heating pads (30-60 minutes up to 6 times a day) will help. Use ice for the first few days to help decrease swelling and bruising, then switch to heat to help relax tight/sore spots and speed recovery.  Some people prefer to use ice alone, heat alone, alternating between ice & heat.  Experiment to what works for you.  Swelling and bruising can take several weeks to resolve.   c. It is helpful to take an over-the-counter pain medication regularly for the first few weeks.  Choose one of the following that works best for you: i. Naproxen (Aleve, etc)  Two 220mg  tabs twice a day ii. Ibuprofen (Advil, etc) Three 200mg  tabs four times a day (every meal &  bedtime) iii. Acetaminophen (Tylenol, etc) 325-650mg  four times a day (every meal & bedtime) d. A  prescription for pain medication should be given to you upon discharge.  Take your pain medication as prescribed.  i. If you are having problems/concerns with the prescription medicine (does not control pain, nausea, vomiting, rash, itching, etc), please call us [phone] to see if we need to switch you to a different pain medicine that will work better for you and/or control your side effect better. ii. If you need a refill on your pain medication, please contact your pharmacy.  They will contact our office to request authorization. Prescriptions will not be filled after 5 pm or on week-ends. 4. Avoid getting constipated.  Between the surgery and the pain medications, it is common to experience some constipation.  Increasing fluid intake and taking a fiber supplement (such as Metamucil, Citrucel, FiberCon, MiraLax, etc) 1-2 times a day regularly will usually help prevent this problem from occurring.  A mild laxative (prune juice, Milk of Magnesia, MiraLax, etc) should be taken according to package directions if there are no bowel movements after 48 hours.   5. Wash / shower every day.  You may shower over the dressings as they are waterproof.   6. Remove your waterproof bandages 5 days after surgery.  You may leave the incision open to air.  You may replace a dressing/Band-Aid to cover the incision for comfort if you wish.  Continue to shower over incision(s) after the  dressing is off.    7. ACTIVITIES as tolerated:   a. You may resume regular (light) daily activities beginning the next day-such as daily self-care, walking, climbing stairs-gradually increasing activities as tolerated.  If you can walk 30 minutes without difficulty, it is safe to try more intense activity such as jogging, treadmill, bicycling, low-impact aerobics, swimming, etc. b. Save the most intensive and strenuous activity for  last such as sit-ups, heavy lifting, contact sports, etc  Refrain from any heavy lifting or straining until you are off narcotics for pain control.   c. DO NOT PUSH THROUGH PAIN.  Let pain be your guide: If it hurts to do something, don't do it.  Pain is your body warning you to avoid that activity for another week until the pain goes down. d. You may drive when you are no longer taking prescription pain medication, you can comfortably wear a seatbelt, and you can safely maneuver your car and apply brakes. e. Dennis Bast may have sexual intercourse when it is comfortable.  8. FOLLOW UP in our office a. Please call [facility] at [phone] to set up an appointment to see your surgeon in the office for a follow-up appointment approximately 2-3 weeks after your surgery. b. Make sure that you call for this appointment the day you arrive home to insure a convenient appointment time. 9.  IF YOU HAVE DISABILITY OR FAMILY LEAVE FORMS, BRING THEM TO THE OFFICE FOR PROCESSING.  DO NOT GIVE THEM TO YOUR DOCTOR.  WHEN TO CALL us [phone]: 1. Poor pain control 2. Reactions / problems with new medications (rash/itching, nausea, etc)  3. Fever over 101.5 F (38.5 C) 4. Inability to urinate 5. Nausea and/or vomiting 6. Worsening swelling or bruising 7. Continued bleeding from incision. 8. Increased pain, redness, or drainage from the incision   The clinic staff is available to answer your questions during regular business hours (8:30am-5pm).  Please don't hesitate to call and ask to speak to one of our nurses for clinical concerns.   If you have a medical emergency, go to the nearest emergency room or call 911.  A surgeon from [facility] is always on call at the hospitals in [facility], [place], [place], [place], [place]  [address] ?  [address] [address], [place], [place]   [address] MAIN: [phone] ? TOLL FREE: [phone] ? FAX: [phone]  [phone] [email or web address]  Managing Pain  Pain after surgery or related to activity is often due to strain/injury to muscle, tendon, nerves and/or incisions.  This pain is usually short-term and will improve in a few months.   Many people find it helpful to do the following things TOGETHER to help speed the process of healing and to get back to regular activity more quickly:  1. Avoid heavy physical activity a.  no lifting greater than 20 pounds b. Do not "push through" the pain.  Listen to your body and avoid positions and maneuvers than reproduce the pain c. Walking is okay as tolerated, but go slowly and stop when getting sore.  d. Remember: If it hurts to do it, then don't do it! 2. Take Anti-inflammatory medication  a. Take with food/snack around the clock for 1-2 weeks i. This helps the muscle and nerve tissues become less irritable and calm down faster ii. Choose Acetaminophen 500mg  tabs (Tylenol) 1-2 pills with every meal and just before bedtime 3. Use a Heating pad or Ice/Cold Pack a. 4-6 times  a day b. May use warm bath/hottub  or showers 4. Try Gentle Massage and/or Stretching  a. at the area of pain many times a day b. stop if you feel pain - do not overdo it  Try these steps together to help you body heal faster and avoid making things get worse.  Doing just one of these things may not be enough.    If you are not getting better after two weeks or are noticing you are getting worse, contact our office for further advice; we may need to re-evaluate you & see what other things we can do to help.

## 2014-01-14 NOTE — Progress Notes (Signed)
Subjective:     Patient ID: Kimberly [name], female   DOB: 02/03/1956, 58 y.o.   MRN: [id number]  HPI  Note: This dictation was prepared with Dragon/digital dictation along with [facility] technology. Any transcriptional errors that result from this process are unintentional.       Kimberly [name]  1955/11/17 [id number]  Patient Care Team: Reginia Naas, MD as PCP - General (Family Medicine) Thompson Grayer, MD as Attending Physician (Cardiology) Louis Meckel, MD as Consulting Physician (Nephrology) Christy Sartorius, MD as Consulting Physician (Urology) Audree Bane, MD as Consulting Physician (Urology) Elayne Snare, MD as Consulting Physician (Endocrinology)  Procedure (Date: 12/14/2013):  POST-OPERATIVE DIAGNOSIS: CKD Renal disease with need for dialysis  VWHernia   PROCEDURE: Procedure(s):  LAPAROSCOPIC LYSIS OF ADHESIONS  LAPAROSCOPIC VENTRAL HERNIA REPAIR W MESH  LAPAROSCOPIC INSERTION CONTINUOUS AMBULATORY PERITONEAL DIALYSIS (CAPD) CATHETER  INSERTION OF MESH   SURGEON: [name], MD   This patient returns for surgical re-evaluation.  She had not be seen for postoperative hernia dilation.  She noted soreness on her abdomen.  We recommended she come in for evaluation for a postop visit.  She concern that she has had "fevers" of 99 on tylenol.  She was concerned about the possibility of infection.  Fever workup in place by PCP/nephrology .  I do not have results of that.  Is eating well.  No nausea or vomiting.  No diarrhea.  No constipation.  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  It is intermittent.  It was very sharp 2 weeks ago.  Used to last for hours.  Less now.  3-4/10 - a little bit better.  Not improved as fast as she would like.  She was concerned and wished to be seen  Patient Active Problem List   Diagnosis Date Noted  . Peritoneal dialysis catheter in place March 2015 01/14/2014  . Ventral hernia  s/p lap repair w mesh March 2015 01/14/2014  . Secondary hyperparathyroidism (of renal origin) 04/20/2013  . PVC's (premature ventricular contractions) 01/17/2012  . Stroke 11/29/2011  . DM type 2 causing complication 123456  . Kidney transplanted   . IgA nephropathy   . GERD 12/07/2007  . COUGH 11/09/2007  . HYPOTHYROIDISM 10/12/2007  . ANEMIA 10/12/2007  . HYPERTENSION 10/12/2007  . CARDIAC ARRHYTHMIA 10/12/2007  . ASTHMA 10/12/2007  . CKD (chronic kidney disease) stage 4, GFR 15-29 ml/min 10/12/2007    Past Medical History  Diagnosis Date  . Kidney transplanted 01/2010    [facility], has had some rejection  . IgA nephropathy     perviously on peritoneal dialysis  . Hypothyroidism   . Anemia   . GERD (gastroesophageal reflux disease)   . Immunosuppression   . First degree AV block   . Premature atrial beat   . Premature ventricular beat   . Shingles 03/2011  . Hypertension     history; resolved on dialysis  . Stroke October 2010, March 2013    cerebral aneurysm  . Diabetes mellitus     medication induced with  steroids  . History of chronic cough   . History of hoarseness   . Renal transplant failure and rejection 2012    Past Surgical History  Procedure Laterality Date  . Kidney transplant  01/2010    [facility]  . Ureter revision  04/2011    [facility]  . Umbilical hernia repair  10/25/2009    open with mesh,  Dr [name]  . Capd insertion  08/25/2008    open, Dr [name]  . Capd removal  03/22/2010    Dr [name]  . Eye surgery    . Capd insertion N/A 12/14/2013    Procedure: LAPAROSCOPIC INSERTION CONTINUOUS AMBULATORY PERITONEAL DIALYSIS  (CAPD) CATHETER;  Surgeon: [name], MD;  Location: [place];  Service: General;  Laterality: N/A;  . Insertion of mesh N/A 12/14/2013    Procedure: INSERTION OF MESH;  Surgeon: [name], MD;  Location: [place];  Service: General;  Laterality: N/A;  . Laparoscopic lysis of adhesions N/A 12/14/2013    Procedure: LAPAROSCOPIC  LYSIS OF ADHESIONS;  Surgeon: [name], MD;  Location: [place];  Service: General;  Laterality: N/A;  . Ventral hernia repair N/A 12/14/2013    Procedure: LAPAROSCOPIC VENTRAL HERNIA;  Surgeon: [name], MD;  Location: [place];  Service: General;  Laterality: N/A;    History   Social History  . Marital Status: Married    Spouse Name: N/A    Number of Children: N/A  . Years of Education: N/A   Occupational History  . Not on file.   Social History Main Topics  . Smoking status: Never Smoker   . Smokeless tobacco: Never Used  . Alcohol Use: No  . Drug Use: No  . Sexual Activity: Yes    Birth Control/ Protection: Post-menopausal   Other Topics Concern  . Not on file   Social History Narrative   Pt lives in [place].  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    Family History  Problem Relation Age of Onset  . Coronary artery disease Mother     Current Outpatient Prescriptions  Medication Sig Dispense Refill  . Amino Acids (ESSENTIAL AMINO ACID MIX) POWD Take 1 packet by mouth daily.      Marland Kitchen aspirin 81 MG tablet Take 81 mg by mouth daily.      . calcitRIOL (ROCALTROL) 0.5 MCG capsule Take 0.5 mcg by mouth See admin instructions. Monday thru Friday 0.13mcg twice daily. Saturday and Sunday 0.62mcg once daily      . gentamicin cream (GARAMYCIN) 0.1 %       . levothyroxine (SYNTHROID, LEVOTHROID) 50 MCG tablet Take 50 mcg by mouth daily before breakfast.      . linagliptin (TRADJENTA) 5 MG TABS tablet Take 1 tablet (5 mg total) by mouth daily.  90 tablet  1  . mycophenolate (MYFORTIC) 180 MG EC tablet Take 180 mg by mouth daily.       Marland Kitchen OVER THE COUNTER MEDICATION Take 2 capsules by mouth 3 (three) times daily. Juice plus capsules      . predniSONE (DELTASONE) 5 MG tablet Take 5 mg by mouth daily with breakfast.      . tacrolimus (PROGRAF) 0.5 MG capsule Take 0.5-1 mg by mouth 2 (two) times daily. Two capules every morning and one capsule every  night      . traMADol (ULTRAM) 50 MG tablet Take 1-2 tablets (50-100 mg total) by mouth every 6 (six) hours as needed for moderate pain or severe pain.  30 tablet  0   No current facility-administered medications for this visit.     Allergies  Allergen Reactions  . Dapsone Rash    Pain in feet  . Pregabalin Rash and Other (See Comments)    fever  . Sulfonamide Derivatives Rash    BP 132/80  Pulse 78  Temp(Src) 98.2 F (  36.8 C) (Temporal)  Resp 14  Ht 5\' 6"  (1.676 m)  Wt 100 lb (45.36 kg)  BMI 16.15 kg/m2  No results found.  Review of Systems  Constitutional: Negative for fever, chills and diaphoresis.  HENT: Negative for ear pain, sore throat and trouble swallowing.   Eyes: Negative for photophobia and visual disturbance.  Respiratory: Negative for cough and choking.   Cardiovascular: Negative for chest pain and palpitations.  Gastrointestinal: Positive for abdominal pain. Negative for nausea, vomiting, diarrhea, constipation, anal bleeding and rectal pain.  Genitourinary: Negative for dysuria, frequency and difficulty urinating.  Musculoskeletal: Negative for gait problem and myalgias.  Skin: Negative for color change, pallor and rash.  Neurological: Negative for dizziness, speech difficulty, weakness and numbness.  Hematological: Negative for adenopathy.  Psychiatric/Behavioral: Negative for confusion and agitation. The patient is not nervous/anxious.        Objective:   Physical Exam  Constitutional: She is oriented to person, place, and time. She appears well-developed and well-nourished. No distress.  HENT:  Head: Normocephalic.  Mouth/Throat: Oropharynx is clear and moist. No oropharyngeal exudate.  Eyes: Conjunctivae and EOM are normal. Pupils are equal, round, and reactive to light. No scleral icterus.  Neck: Normal range of motion. No tracheal deviation present.  Cardiovascular: Normal rate and intact distal pulses.   Pulmonary/Chest: Effort normal. No  respiratory distress. She exhibits no tenderness.  Abdominal: Soft. She exhibits no distension. There is tenderness in the periumbilical area. There is no rigidity, no rebound, no guarding, no tenderness at McBurney's point and negative Murphy's sign. No hernia. Hernia confirmed negative in the right inguinal area and confirmed negative in the left inguinal area.    Incisions clean with normal healing ridges.  No hernias.  Peritoneal dialysis catheter in place.  Exit site clean.  Genitourinary: No vaginal discharge found.  Musculoskeletal: Normal range of motion. She exhibits no tenderness.  Lymphadenopathy:       Right: No inguinal adenopathy present.       Left: No inguinal adenopathy present.  Neurological: She is alert and oriented to person, place, and time. No cranial nerve deficit. She exhibits normal muscle tone. Coordination normal.  Skin: Skin is warm and dry. No rash noted. She is not diaphoretic.  Psychiatric: She has a normal mood and affect. Her behavior is normal.       Assessment:     Recovering one month status post laparoscopic ventral hernia repair and placement of peritoneal dialysis catheter    Plan:     She looks okay to me.  I tried to reassure her that some intermittent pains with activity he is expected one month after her ventral hernia repair with mesh.  She is very concerned about her prior episode of pseudomonas infection 3 years ago that was had to figure out & was worried that something was being missed.  She is eating fine.  She does not have peritonitis.  She has low-grade fevers at most.  She is not really taking anything for pain.  I think it is reasonable to do heat and Tylenol round-the-clock to help that abd wall soreness resolve as it should over the next month.  If she worsens, regroup with more aggressive workup.  However, I think she is in an expected postoperative recovery period.  Given her chronic immunosuppression and somewhat fragile straight, it  does not surprise me that she is not normal one month out from surgery.   Increase activity as tolerated to regular activity.  Low  impact exercise such as walking an hour a day at least ideal.  Do not push through pain.  Diet as tolerated.  Low fat high fiber diet ideal.  Bowel regimen with 30 g fiber a day and fiber supplement as needed to avoid problems.  Continue peritoneal dialysis flushing and catheter usage per nephrology and peritoneal nurse team.  Followup on peritoneal fluid/fever workup per them.  Return to clinic as needed.   Instructions discussed.  Followup with primary care physician for other health issues as would normally be done.  Consider screening for malignancies (breast, prostate, colon, melanoma, etc) as appropriate.  Questions answered.  The patient expressed understanding and appreciation

## 2014-02-07 ENCOUNTER — Encounter (INDEPENDENT_AMBULATORY_CARE_PROVIDER_SITE_OTHER): Payer: BC Managed Care – PPO | Admitting: Surgery

## 2014-02-25 ENCOUNTER — Other Ambulatory Visit: Payer: BC Managed Care – PPO

## 2014-03-02 ENCOUNTER — Ambulatory Visit: Payer: BC Managed Care – PPO | Admitting: Endocrinology

## 2014-06-13 ENCOUNTER — Ambulatory Visit: Payer: BC Managed Care – PPO | Admitting: Internal Medicine

## 2014-06-20 ENCOUNTER — Ambulatory Visit
Admission: RE | Admit: 2014-06-20 | Discharge: 2014-06-20 | Disposition: A | Discharge status: Home / Self Care | Payer: BC Managed Care – PPO | Source: Ambulatory Visit | Attending: Internal Medicine | Admitting: Internal Medicine

## 2014-06-20 ENCOUNTER — Encounter: Payer: Self-pay | Admitting: Internal Medicine

## 2014-06-20 ENCOUNTER — Ambulatory Visit (INDEPENDENT_AMBULATORY_CARE_PROVIDER_SITE_OTHER): Payer: BLUE CROSS/BLUE SHIELD | Admitting: Internal Medicine

## 2014-06-20 ENCOUNTER — Other Ambulatory Visit: Payer: Self-pay | Admitting: Internal Medicine

## 2014-06-20 VITALS — BP 117/79 | HR 73 | Temp 98.2°F | Wt 118.0 lb

## 2014-06-20 DIAGNOSIS — R509 Fever, unspecified: Secondary | ICD-10-CM

## 2014-06-20 DIAGNOSIS — R05 Cough: Secondary | ICD-10-CM

## 2014-06-20 DIAGNOSIS — R053 Chronic cough: Secondary | ICD-10-CM

## 2014-06-20 NOTE — Progress Notes (Signed)
Just seen in clinic today. cxr suggestive of apical infiltrates. Concern for atypical infection. Including mycobacteria, vs. Nocardia. She was previously immunocompromised host. She has no risk factors for mTB.

## 2014-06-20 NOTE — Progress Notes (Signed)
Subjective:    Patient ID: Kimberly [name], female    DOB: [birth year], 58 y.o.   MRN: [id number]  HPI 58yo f with ESRD, hx of renal txp failed, CKD on PD, PD catheter placed  In March 2015 in setting of lap ventral hernia repair with mesh, and LOA by Dr. Johney Maine. She was tapered off her immunesuppression up until July/august 2014. She noticed that her fevers have started since her cathter placement. 100.3-100.24F, mostly at night x 5 months. She notices fevers higher since being off of prednisone  - post surgery complicated mostly by abdominal pain, since she attributes it to complicated surgery - no drainage from surgical incision sites  Due to fevers, PCP checked ua and treated her for recurrent UTI where she had taken cipro, keflex on 2 separate occasions. Patient  Is oligouric and recalls being asymptomatic (last sx 20-57yrs ago). But treated due to fevers.  - she notices having arthritis to wrist bilateral, had it man years ago, but disappeared with taking prednisone, but now reappeared since being off of prednisone.   Also notices : appetite decreased ? Possible loss of weight.  Shingles 3 years ago c/b post herpetic neuralgia. Feels pain of left shoulder blade/flank  She had CMV +donor, but unclear of her own status. She reports having BK virus in the past  Allergies  Allergen Reactions  . Dapsone Rash    Pain in feet  . Pregabalin Rash and Other (See Comments)    fever  . Sulfonamide Derivatives Rash  . Tramadol Rash    Rash on cheek   Current Outpatient Prescriptions on File Prior to Visit  Medication Sig Dispense Refill  . aspirin 81 MG tablet Take 81 mg by mouth daily.      . calcitRIOL (ROCALTROL) 0.5 MCG capsule Take 0.5 mcg by mouth See admin instructions. Monday thru Friday 0.86mcg twice daily. Saturday and Sunday 0.69mcg once daily      . levothyroxine (SYNTHROID, LEVOTHROID) 50 MCG tablet Take 50 mcg by mouth daily before breakfast.      . Amino Acids (ESSENTIAL AMINO  ACID MIX) POWD Take 1 packet by mouth daily.      Marland Kitchen gentamicin cream (GARAMYCIN) 0.1 %       . linagliptin (TRADJENTA) 5 MG TABS tablet Take 1 tablet (5 mg total) by mouth daily.  90 tablet  1  . mycophenolate (MYFORTIC) 180 MG EC tablet Take 180 mg by mouth daily.       Marland Kitchen OVER THE COUNTER MEDICATION Take 2 capsules by mouth 3 (three) times daily. Juice plus capsules      . predniSONE (DELTASONE) 5 MG tablet Take 5 mg by mouth daily with breakfast.      . tacrolimus (PROGRAF) 0.5 MG capsule Take 0.5-1 mg by mouth 2 (two) times daily. Two capules every morning and one capsule every night      . traMADol (ULTRAM) 50 MG tablet Take 1-2 tablets (50-100 mg total) by mouth every 6 (six) hours as needed for moderate pain or severe pain.  30 tablet  0   No current facility-administered medications on file prior to visit.     Soc Hx; works a Armed forces operational officer. Has a dog, good state of health. No travel. No cats. No gardening  Family hx: family history includes Coronary artery disease in her mother.    Review of Systems + chills, fever, fatigue, decrease appetite, wrist aches. + chronic cough. 10 point ros is otherwise negative  Objective:   Physical Exam BP 117/79  Pulse 73  Temp(Src) 98.2 F (36.8 C) (Oral)  Wt 118 lb (53.524 kg) Physical Exam  Constitutional:  oriented to person, place, and time. appears well-developed and well-nourished. No distress.  HENT:  Mouth/Throat: Oropharynx is clear and moist. No oropharyngeal exudate.  Cardiovascular: Normal rate, regular rhythm and normal heart sounds. Exam reveals no gallop and no friction rub.  No murmur heard.  Pulmonary/Chest: Effort normal and breath sounds normal. No respiratory distress.  has no wheezes.  Abdominal: Soft. Bowel sounds are normal.  exhibits no distension. There is no tenderness. PD catheter in place. No surrounding erythema Lymphadenopathy: no cervical adenopathy.  Neurological: alert and oriented to  person, place, and time.  Skin: Skin is warm and dry. No rash noted. No erythema.  Psychiatric: a normal mood and affect.  behavior is normal.       Assessment & Plan:   FUO = will start with lab work and then proceed to imaging and cxr.  Health maintenance = will recommend that she gets flu shot  rtc in 2-3 wks.

## 2014-06-21 ENCOUNTER — Telehealth: Payer: Self-pay | Admitting: Internal Medicine

## 2014-06-21 LAB — CBC WITH DIFFERENTIAL/PLATELET
Basophils Absolute: 0 10*3/uL (ref 0.0–0.1)
Basophils Relative: 0 % (ref 0–1)
Eosinophils Absolute: 0.4 10*3/uL (ref 0.0–0.7)
Eosinophils Relative: 8 % — ABNORMAL HIGH (ref 0–5)
HCT: 28.1 % — ABNORMAL LOW (ref 36.0–46.0)
Hemoglobin: 9.2 g/dL — ABNORMAL LOW (ref 12.0–15.0)
LYMPHS ABS: 0.5 10*3/uL — AB (ref 0.7–4.0)
LYMPHS PCT: 9 % — AB (ref 12–46)
MCH: 28.8 pg (ref 26.0–34.0)
MCHC: 32.7 g/dL (ref 30.0–36.0)
MCV: 87.8 fL (ref 78.0–100.0)
Monocytes Absolute: 0.4 10*3/uL (ref 0.1–1.0)
Monocytes Relative: 7 % (ref 3–12)
NEUTROS PCT: 76 % (ref 43–77)
Neutro Abs: 3.9 10*3/uL (ref 1.7–7.7)
Platelets: 309 10*3/uL (ref 150–400)
RBC: 3.2 MIL/uL — AB (ref 3.87–5.11)
RDW: 13.5 % (ref 11.5–15.5)
WBC: 5.1 10*3/uL (ref 4.0–10.5)

## 2014-06-21 NOTE — Telephone Encounter (Signed)
  INTERNAL MEDICINE RESIDENCY PROGRAM After-Hours Telephone Call    Details of incoming call:   I received a call from [facility] labs regarding a critical lab value of creatinine, which was ordered on 06/20/2014 by Dr. Baxter Flattery during a Clinic visit. This lab was ordered for evaluation of 11.    Important Historical Information:   Patient with CKD. Creatinine appears to have been trending upward for the past several month. Last Cr ~7, in March 2015    Pertinent Labs:      Assessment / Plan:   I did  place a call to Kimberly [name], with the following outcome: discussed with the patient about the results which she seemed to be aware of. She informed me that she is a peritoneal dialysis patient and she will let her nephrologist know about the results.   I will route note to Dr Baxter Flattery as well.  As always, pt is advised that if symptoms worsen or new symptoms arise, they should go to an urgent care facility or to to ER for further evaluation.    Jessee Avers, MD  06/21/2014, 7:32 AM

## 2014-06-21 NOTE — Telephone Encounter (Signed)
Richard thanks for taking the call. The patient has esrd on pd.

## 2014-06-23 ENCOUNTER — Other Ambulatory Visit: Payer: BC Managed Care – PPO

## 2014-06-23 DIAGNOSIS — R053 Chronic cough: Secondary | ICD-10-CM

## 2014-06-23 DIAGNOSIS — R05 Cough: Secondary | ICD-10-CM

## 2014-06-23 DIAGNOSIS — A310 Pulmonary mycobacterial infection: Secondary | ICD-10-CM

## 2014-06-23 DIAGNOSIS — R509 Fever, unspecified: Secondary | ICD-10-CM

## 2014-06-26 LAB — CULTURE, BLOOD (SINGLE)
Organism ID, Bacteria: NO GROWTH
Organism ID, Bacteria: NO GROWTH

## 2014-06-27 ENCOUNTER — Telehealth: Payer: Self-pay | Admitting: *Deleted

## 2014-06-27 NOTE — Telephone Encounter (Signed)
Called and left a message for the patient about an appt at [facility] for her CT Chest. Her appt is set for 06/30/14 at 9 am. Advised her to arrive by 845 and gave her the number to call and reschedule if that day is not good for her.

## 2014-06-28 DIAGNOSIS — N19 Unspecified kidney failure: Secondary | ICD-10-CM | POA: Insufficient documentation

## 2014-06-28 DIAGNOSIS — T8612 Kidney transplant failure: Secondary | ICD-10-CM | POA: Insufficient documentation

## 2014-06-29 ENCOUNTER — Other Ambulatory Visit: Payer: Self-pay | Admitting: Internal Medicine

## 2014-06-29 DIAGNOSIS — R911 Solitary pulmonary nodule: Secondary | ICD-10-CM

## 2014-06-30 ENCOUNTER — Encounter (HOSPITAL_COMMUNITY): Payer: Self-pay

## 2014-06-30 ENCOUNTER — Ambulatory Visit (HOSPITAL_COMMUNITY)
Admission: RE | Admit: 2014-06-30 | Discharge: 2014-06-30 | Disposition: A | Discharge status: Home / Self Care | Payer: BC Managed Care – PPO | Source: Ambulatory Visit | Attending: Internal Medicine | Admitting: Internal Medicine

## 2014-06-30 DIAGNOSIS — I2510 Atherosclerotic heart disease of native coronary artery without angina pectoris: Secondary | ICD-10-CM | POA: Diagnosis not present

## 2014-06-30 DIAGNOSIS — K449 Diaphragmatic hernia without obstruction or gangrene: Secondary | ICD-10-CM | POA: Insufficient documentation

## 2014-06-30 DIAGNOSIS — I313 Pericardial effusion (noninflammatory): Secondary | ICD-10-CM | POA: Diagnosis not present

## 2014-06-30 DIAGNOSIS — R053 Chronic cough: Secondary | ICD-10-CM

## 2014-06-30 DIAGNOSIS — R310 Gross hematuria: Secondary | ICD-10-CM | POA: Insufficient documentation

## 2014-06-30 DIAGNOSIS — R05 Cough: Secondary | ICD-10-CM | POA: Insufficient documentation

## 2014-06-30 DIAGNOSIS — R509 Fever, unspecified: Secondary | ICD-10-CM

## 2014-06-30 DIAGNOSIS — R918 Other nonspecific abnormal finding of lung field: Secondary | ICD-10-CM | POA: Insufficient documentation

## 2014-07-04 ENCOUNTER — Ambulatory Visit (INDEPENDENT_AMBULATORY_CARE_PROVIDER_SITE_OTHER): Payer: BC Managed Care – PPO | Admitting: Internal Medicine

## 2014-07-04 ENCOUNTER — Ambulatory Visit: Payer: BC Managed Care – PPO | Admitting: Internal Medicine

## 2014-07-04 ENCOUNTER — Encounter: Payer: Self-pay | Admitting: Internal Medicine

## 2014-07-04 VITALS — BP 90/52 | HR 70 | Temp 98.5°F | Ht 66.0 in | Wt 117.8 lb

## 2014-07-04 DIAGNOSIS — R059 Cough, unspecified: Secondary | ICD-10-CM

## 2014-07-04 DIAGNOSIS — R05 Cough: Secondary | ICD-10-CM

## 2014-07-04 DIAGNOSIS — R918 Other nonspecific abnormal finding of lung field: Secondary | ICD-10-CM

## 2014-07-04 NOTE — Progress Notes (Signed)
Subjective:    Patient ID: Kimberly [name], female    DOB: [birth year]  MRN: [id number]  HPI  11 yowf never smoker CRI in early 80s from Kimberly [name]>  HD 2009 then transplant 2011 and started failing in 2012 and completely failed March 2015 and restarted PD and stopped immunosuppression around June 2015 but fever ever since surgery in March to place the PD catheter and referred to [name] 07/04/2014  by Dr Kimberly [name] for new (since 11/2013) left apical infiltrate and cough worse since stopped immunosuppression in June 2015   07/04/2014 1st Kimberly [name] office visit/ Kimberly [name]   Chief Complaint  Patient presents with  . [name] Consult    Referred per Dr. Karolee [name]. Pt cough for the past 10-12 yrs- occ prod in the am with clear sputum. She also c/o fever since 12/15/13 when she had peritoneal cath placed.   cough x 10 years/ better on immunosuppression, generally dry, day > noct ,  Not better on asthma meds in past. Min sweats, no rigors or cp. Mild doe also chronically   No obvious other patterns in day to day or daytime variabilty or assoc  chest tightness, subjective wheeze overt sinus or hb symptoms. No unusual exp hx or h/o childhood pna/ asthma or knowledge of premature birth.  Sleeping ok without nocturnal  or early am exacerbation  of respiratory  c/o's or need for noct saba. Also denies any obvious fluctuation of symptoms with weather or environmental changes or other aggravating or alleviating factors except as outlined above   Current Medications, Allergies, Complete Past Medical History, Past Surgical History, Family History, and Social History were reviewed in [employer] record.             .  Review of Systems  Constitutional: Positive for fever and appetite change. Negative for chills and unexpected weight change.  HENT: Positive for congestion. Negative for dental problem, ear pain, nosebleeds, postnasal drip, rhinorrhea, sinus  pressure, sneezing, sore throat, trouble swallowing and voice change.   Eyes: Negative for visual disturbance.  Respiratory: Positive for cough and shortness of breath. Negative for choking and stridor.   Cardiovascular: Negative for chest pain and leg swelling.  Gastrointestinal: Negative for vomiting, abdominal pain and diarrhea.  Genitourinary: Negative for difficulty urinating.  Musculoskeletal: Negative for arthralgias.  Skin: Negative for rash.  Neurological: Negative for tremors, syncope and headaches.  Hematological: Does not bruise/bleed easily.       Objective:   Physical Exam  Thin amb wf nad  Wt Readings from Last 3 Encounters:  07/04/14 117 lb 12.8 oz (53.434 kg)  06/20/14 118 lb (53.524 kg)  01/14/14 100 lb (45.36 kg)     HEENT: nl dentition, turbinates, and orophanx. Nl external ear canals without cough reflex   NECK :  without JVD/Nodes/TM/ nl carotid upstrokes bilaterally   LUNGS: no acc muscle use, clear to A and P bilaterally without cough on insp or exp maneuvers   CV:  RRR  no s3 or murmur or increase in P2, no edema   ABD:  soft and nontender with nl excursion in the supine position. No bruits or organomegaly, bowel sounds nl  MS:  warm without deformities, calf tenderness, cyanosis or clubbing  SKIN: warm and dry without lesions    NEURO:  alert, approp, no deficits    CT 06/30/14 1. Multiple bilateral ill-defined nodular [name] infiltrates, the  largest conglomerate is in the left upper lobe and measures 4.1  cm  in maximum diameter. These could represent infectious/inflammatory  infiltrates including atypical/opportunistic infection including  granulomatous disease. Differential diagnosis also includes  metastatic disease.  2. Small pericardial effusion. Coronary artery disease.  3. Ascites.  4. Bilateral renal atrophy with chronic right hydronephrosis.  5. Sliding hiatal hernia. Esophageal wall thickening is noted.       Assessment  & Plan:

## 2014-07-04 NOTE — Patient Instructions (Signed)
Come to outpatient registration at [facility] (behind the ER) at 715 am Tuesday  07/05/14 with nothing to eat or drink after midnight tonight .for Bronchoscopy with biopsy and someone will need to drive you home

## 2014-07-05 ENCOUNTER — Encounter (HOSPITAL_COMMUNITY)
Admission: RE | Disposition: A | Discharge status: Home / Self Care | Payer: Self-pay | Source: Ambulatory Visit | Attending: Internal Medicine

## 2014-07-05 ENCOUNTER — Ambulatory Visit (HOSPITAL_COMMUNITY): Payer: BC Managed Care – PPO

## 2014-07-05 ENCOUNTER — Ambulatory Visit (HOSPITAL_COMMUNITY)
Admission: RE | Admit: 2014-07-05 | Discharge: 2014-07-05 | Disposition: A | Discharge status: Home / Self Care | Payer: BC Managed Care – PPO | Source: Ambulatory Visit | Attending: Internal Medicine | Admitting: Internal Medicine

## 2014-07-05 DIAGNOSIS — K449 Diaphragmatic hernia without obstruction or gangrene: Secondary | ICD-10-CM | POA: Diagnosis not present

## 2014-07-05 DIAGNOSIS — R188 Other ascites: Secondary | ICD-10-CM | POA: Diagnosis not present

## 2014-07-05 DIAGNOSIS — N186 End stage renal disease: Secondary | ICD-10-CM | POA: Insufficient documentation

## 2014-07-05 DIAGNOSIS — Z992 Dependence on renal dialysis: Secondary | ICD-10-CM | POA: Diagnosis not present

## 2014-07-05 DIAGNOSIS — E119 Type 2 diabetes mellitus without complications: Secondary | ICD-10-CM | POA: Insufficient documentation

## 2014-07-05 DIAGNOSIS — I313 Pericardial effusion (noninflammatory): Secondary | ICD-10-CM | POA: Insufficient documentation

## 2014-07-05 DIAGNOSIS — T8612 Kidney transplant failure: Secondary | ICD-10-CM | POA: Diagnosis not present

## 2014-07-05 DIAGNOSIS — Z9889 Other specified postprocedural states: Secondary | ICD-10-CM

## 2014-07-05 DIAGNOSIS — J45909 Unspecified asthma, uncomplicated: Secondary | ICD-10-CM | POA: Diagnosis not present

## 2014-07-05 DIAGNOSIS — I120 Hypertensive chronic kidney disease with stage 5 chronic kidney disease or end stage renal disease: Secondary | ICD-10-CM | POA: Diagnosis not present

## 2014-07-05 DIAGNOSIS — I493 Ventricular premature depolarization: Secondary | ICD-10-CM | POA: Diagnosis not present

## 2014-07-05 DIAGNOSIS — R918 Other nonspecific abnormal finding of lung field: Secondary | ICD-10-CM | POA: Insufficient documentation

## 2014-07-05 DIAGNOSIS — N2581 Secondary hyperparathyroidism of renal origin: Secondary | ICD-10-CM | POA: Insufficient documentation

## 2014-07-05 DIAGNOSIS — N1330 Unspecified hydronephrosis: Secondary | ICD-10-CM | POA: Diagnosis not present

## 2014-07-05 DIAGNOSIS — I2510 Atherosclerotic heart disease of native coronary artery without angina pectoris: Secondary | ICD-10-CM | POA: Diagnosis not present

## 2014-07-05 DIAGNOSIS — N029 Recurrent and persistent hematuria with unspecified morphologic changes: Secondary | ICD-10-CM | POA: Diagnosis not present

## 2014-07-05 DIAGNOSIS — E039 Hypothyroidism, unspecified: Secondary | ICD-10-CM | POA: Diagnosis not present

## 2014-07-05 DIAGNOSIS — R05 Cough: Secondary | ICD-10-CM | POA: Insufficient documentation

## 2014-07-05 DIAGNOSIS — K219 Gastro-esophageal reflux disease without esophagitis: Secondary | ICD-10-CM | POA: Diagnosis not present

## 2014-07-05 DIAGNOSIS — N261 Atrophy of kidney (terminal): Secondary | ICD-10-CM | POA: Diagnosis not present

## 2014-07-05 HISTORY — PX: VIDEO BRONCHOSCOPY: SHX5072

## 2014-07-05 LAB — BODY FLUID CELL COUNT WITH DIFFERENTIAL
Lymphs, Fluid: 5 %
Monocyte-Macrophage-Serous Fluid: 71 % (ref 50–90)
Neutrophil Count, Fluid: 24 % (ref 0–25)
Total Nucleated Cell Count, Fluid: 59 cu mm (ref 0–1000)

## 2014-07-05 SURGERY — BRONCHOSCOPY, WITH FLUOROSCOPY
Anesthesia: Moderate Sedation | Laterality: Bilateral

## 2014-07-05 MED ORDER — PHENYLEPHRINE HCL 0.25 % NA SOLN
NASAL | Status: DC | PRN
  Administered 2014-07-05: 1 via NASAL

## 2014-07-05 MED ORDER — FENTANYL CITRATE 0.05 MG/ML IJ SOLN
INTRAMUSCULAR | Status: AC
  Filled 2014-07-05: qty 4

## 2014-07-05 MED ORDER — SODIUM CHLORIDE 0.9 % IV SOLN
INTRAVENOUS | Status: DC
  Administered 2014-07-05: 08:00:00 via INTRAVENOUS

## 2014-07-05 MED ORDER — MIDAZOLAM HCL 10 MG/2ML IJ SOLN
INTRAMUSCULAR | Status: AC
  Filled 2014-07-05: qty 4

## 2014-07-05 MED ORDER — LIDOCAINE HCL 2 % EX GEL: 1.0000 "application " | Freq: Once | CUTANEOUS | Status: DC

## 2014-07-05 MED ORDER — LIDOCAINE HCL 2 % EX GEL
CUTANEOUS | Status: DC | PRN
  Administered 2014-07-05: 1

## 2014-07-05 MED ORDER — MIDAZOLAM HCL 10 MG/2ML IJ SOLN
INTRAMUSCULAR | Status: DC | PRN
  Administered 2014-07-05 (×2): 2.5 mg via INTRAVENOUS

## 2014-07-05 MED ORDER — MEPERIDINE HCL 100 MG/ML IJ SOLN: 100.0000 mg | Freq: Once | INTRAMUSCULAR | Status: DC

## 2014-07-05 MED ORDER — MEPERIDINE HCL 25 MG/ML IJ SOLN
INTRAMUSCULAR | Status: DC | PRN
  Administered 2014-07-05 (×2): 25 mg via INTRAVENOUS

## 2014-07-05 MED ORDER — MEPERIDINE HCL 100 MG/ML IJ SOLN
INTRAMUSCULAR | Status: AC
  Filled 2014-07-05: qty 2

## 2014-07-05 MED ORDER — LIDOCAINE HCL 1 % IJ SOLN
INTRAMUSCULAR | Status: DC | PRN
  Administered 2014-07-05: 6 mL via RESPIRATORY_TRACT

## 2014-07-05 MED ORDER — MIDAZOLAM HCL 10 MG/2ML IJ SOLN: 1.0000 mg | Freq: Once | INTRAMUSCULAR | Status: DC

## 2014-07-05 MED ORDER — PHENYLEPHRINE HCL 0.25 % NA SOLN: 1.0000 | Freq: Four times a day (QID) | NASAL | Status: DC | PRN

## 2014-07-05 NOTE — Op Note (Signed)
Bronchoscopy Procedure Note  Date of Operation: 07/05/2014   Pre-op Diagnosis: pulmonary infiltrates ? etiology  Post-op Diagnosis: same  Surgeon: Christinia Gully  Anesthesia: Monitored Local Anesthesia with Sedation  Operation: Video Flexible fiberoptic bronchoscopy, diagnostic   Findings: nl airways  Specimen: BAL/ TBBX LUL apical segment  Estimated Blood Loss: min  Complications: none  Indications and History: See updated H and P same date. The risks, benefits, complications, treatment options and expected outcomes were discussed with the patient.  The possibilities of reaction to medication, pulmonary aspiration, perforation of a viscus, bleeding, failure to diagnose a condition and creating a complication requiring transfusion or operation were discussed with the patient who freely signed the consent.    Description of Procedure: The patient was re-examined in the bronchoscopy suite and the site of surgery properly noted/marked.  The patient was identified  and the procedure verified as Flexible Fiberoptic Bronchoscopy.  A Time Out was held and the above information confirmed.   After the induction of topical nasopharyngeal anesthesia, the patient was positioned  and the bronchoscope was passed through the R naris. The vocal cords were visualized and  1% buffered lidocaine 5 ml was topically placed onto the cords. The cords were nl. The scope was then passed into the trachea.  1% buffered lidocaine given topically. Airways inspected bilaterally to the subsegmental level with the following findings:  1) airways nl  Interventions 1) BAL LUL apical segment 2) TBBX LUL apical segment x 4, fluoro directed, no bleeding or ptx evident        Attestation: I performed the procedure.  Christinia Gully, MD Pulmonary and [facility] [phone] After 5:30 PM or weekends, call [phone]

## 2014-07-05 NOTE — H&P (Signed)
Patient ID: Kimberly [name], female    DOB: 05/19/1956  MRN: [id number]   HPI   37 yowf never smoker CRI in early 80s from IgA nephropathy>  HD 2009 then transplant 2011 and started failing in 2012 and completely failed March 2015 and restarted PD and stopped immunosuppression around June 2015 but fever ever since surgery in March to place the PD catheter and referred to pulmonary clinic 07/04/2014  by Dr Abelino Derrick for new (since 11/2013) left apical infiltrate and cough worse since stopped immunosuppression in June 2015     07/04/2014 1st [facility] office visit/ Kimberly [name]    Chief Complaint   Patient presents with   .  Pulmonary Consult       Referred per Dr. Karolee Ohs. Pt cough for the past 10-12 yrs- occ prod in the am with clear sputum. She also c/o fever since 12/15/13 when she had peritoneal cath placed.     cough x 10 years/ better on immunosuppression, generally dry, day > noct ,  Not better on asthma meds in past. Min sweats, no rigors or cp. Mild doe also chronically    No obvious other patterns in day to day or daytime variabilty or assoc  chest tightness, subjective wheeze overt sinus or hb symptoms. No unusual exp hx or h/o childhood pna/ asthma or knowledge of premature birth.   Sleeping ok without nocturnal  or early am exacerbation  of respiratory  c/o's or need for noct saba. Also denies any obvious fluctuation of symptoms with weather or environmental changes or other aggravating or alleviating factors except as outlined above    Current Medications, Allergies, Complete Past Medical History, Past Surgical History, Family History, and Social History were reviewed in [employer] record.                 .   Review of Systems  Constitutional: Positive for fever and appetite change. Negative for chills and unexpected weight change.  HENT: Positive for congestion. Negative for dental problem, ear pain, nosebleeds, postnasal drip,  rhinorrhea, sinus pressure, sneezing, sore throat, trouble swallowing and voice change.   Eyes: Negative for visual disturbance.  Respiratory: Positive for cough and shortness of breath. Negative for choking and stridor.   Cardiovascular: Negative for chest pain and leg swelling.  Gastrointestinal: Negative for vomiting, abdominal pain and diarrhea.  Genitourinary: Negative for difficulty urinating.  Musculoskeletal: Negative for arthralgias.  Skin: Negative for rash.  Neurological: Negative for tremors, syncope and headaches.  Hematological: Does not bruise/bleed easily.          Objective:     Physical Exam   Thin amb wf nad    Wt Readings from Last 3 Encounters:   07/04/14  117 lb 12.8 oz (53.434 kg)   06/20/14  118 lb (53.524 kg)   01/14/14  100 lb (45.36 kg)        HEENT: nl dentition, turbinates, and orophanx. Nl external ear canals without cough reflex     NECK :  without JVD/Nodes/TM/ nl carotid upstrokes bilaterally     LUNGS: no acc muscle use, clear to A and P bilaterally without cough on insp or exp maneuvers     CV:  RRR  no s3 or murmur or increase in P2, no edema    ABD:  soft and nontender with nl excursion in the supine position. No bruits or organomegaly, bowel sounds nl   MS:  warm without deformities, calf tenderness, cyanosis or clubbing  SKIN: warm and dry without lesions     NEURO:  alert, approp, no deficits     CT 06/30/14 1. Multiple bilateral ill-defined nodular pulmonary infiltrates, the   largest conglomerate is in the left upper lobe and measures 4.1 cm   in maximum diameter. These could represent infectious/inflammatory   infiltrates including atypical/opportunistic infection including   granulomatous disease. Differential diagnosis also includes   metastatic disease.   2. Small pericardial effusion. Coronary artery disease.   3. Ascites.   4. Bilateral renal atrophy with chronic right hydronephrosis.   5. Sliding  hiatal hernia. Esophageal wall thickening is noted.           Assessment & Plan:   Pulmonary infiltrates -    Afb smear neg but this could still be TB or atypical tb for sure ? How immunocompromised she is at present if at all ? - assoc with low grade fuo   Discussed in detail all the  indications, usual  risks and alternatives  relative to the benefits with patient who agrees to proceed with bronchoscopy with biopsy in am (aware pt on baby asa with min risk of excess bleeding so limited bx/ mostly BAL needed)          COUGH - [name], MD at 07/05/2014  6:10 AM      Status: Written Related Problem: COUGH    Present for 10 years neg resp to asthma rx but resolved on immunsuppression so could be eos bronchitis or rhinitis with pnds but could also be cough variant asthma on the wrong asthma rx (many make cough worse, esp dpi)   However the most common cause by far of common cough is    Upper airway cough syndrome, so named because it's frequently impossible to sort out how much is  CR/sinusitis with freq throat clearing (which can be related to primary GERD)   vs  causing  secondary (" extra esophageal")  GERD from wide swings in gastric pressure that occur with throat clearing, often  promoting self use of mint and menthol lozenges that reduce the lower esophageal sphincter tone and exacerbate the problem further in a cyclical fashion.    These are the same pts (now being labeled as having "irritable larynx syndrome" by some cough centers) who not infrequently have a history of having failed to tolerate ace inhibitors,  dry powder inhalers or biphosphonates or report having atypical reflux symptoms that don't respond to standard doses of PPI , and are easily confused as having aecopd or asthma flares by even experienced allergists/ pulmonologists.    For now no immediate need for rx      Christinia Gully, MD Pulmonary and [place]  [phone] After 5:30 PM or weekends, call [phone]

## 2014-07-05 NOTE — Assessment & Plan Note (Signed)
Present for 10 years neg resp to asthma rx but resolved on immunsuppression so could be eos bronchitis or rhinitis with pnds but could also be cough variant asthma on the wrong asthma rx (many make cough worse, esp dpi)  However the most common cause by far of common cough is    Upper airway cough syndrome, so named because it's frequently impossible to sort out how much is  CR/sinusitis with freq throat clearing (which can be related to primary GERD)   vs  causing  secondary (" extra esophageal")  GERD from wide swings in gastric pressure that occur with throat clearing, often  promoting self use of mint and menthol lozenges that reduce the lower esophageal sphincter tone and exacerbate the problem further in a cyclical fashion.   These are the same pts (now being labeled as having "irritable larynx syndrome" by some cough centers) who not infrequently have a history of having failed to tolerate ace inhibitors,  dry powder inhalers or biphosphonates or report having atypical reflux symptoms that don't respond to standard doses of PPI , and are easily confused as having aecopd or asthma flares by even experienced allergists/ pulmonologists.   For now no immediate need for rx

## 2014-07-05 NOTE — Assessment & Plan Note (Signed)
Afb smear neg but this could still be TB or atypical tb for sure ? How immunocompromised she is at present if at all ? - assoc with low grade fuo  Discussed in detail all the  indications, usual  risks and alternatives  relative to the benefits with patient who agrees to proceed with bronchoscopy with biopsy in am (aware pt on baby asa with min risk of excess bleeding so limited bx/ mostly BAL needed)

## 2014-07-05 NOTE — Progress Notes (Signed)
Video Bronchoscopy done  Intervention Bronchial washing done Intervention Bronchial biopsy done  Procedure tolerated well 

## 2014-07-05 NOTE — Discharge Instructions (Signed)
Flexible Bronchoscopy, Care After These instructions give you information on caring for yourself after your procedure. Your doctor may also give you more specific instructions. Call your doctor if you have any problems or questions after your procedure. HOME CARE  Do not eat or drink anything for 2 hours after your procedure. If you try to eat or drink before the medicine wears off, food or drink could go into your lungs. You could also burn yourself.  After 2 hours have passed and when you can cough and gag normally, you may eat soft food and drink liquids slowly.  The day after the test, you may eat your normal diet.  You may do your normal activities.  Keep all doctor visits. GET HELP RIGHT AWAY IF:  You get more and more short of breath.  You get light-headed.  You feel like you are going to pass out (faint).  You have chest pain.  You have new problems that worry you.  You cough up more than a little blood.  You cough up more blood than before. MAKE SURE YOU:  Understand these instructions.  Will watch your condition.  Will get help right away if you are not doing well or get worse. Document Released: 07/07/2009 Document Revised: 09/14/2013 Document Reviewed: 05/14/2013 [facility] Patient Information 2015 [place], [place]. This information is not intended to replace advice given to you by your health care provider. Make sure you discuss any questions you have with your health care provider.  Nothing to eat or drink until    10:30 am   Today   07/05/2014   Hold aspirin x 3 doses to resume Friday 07/08/14

## 2014-07-06 ENCOUNTER — Encounter (HOSPITAL_COMMUNITY): Payer: Self-pay | Admitting: Internal Medicine

## 2014-07-06 ENCOUNTER — Encounter: Payer: Self-pay | Admitting: Internal Medicine

## 2014-07-07 ENCOUNTER — Ambulatory Visit: Payer: BC Managed Care – PPO | Admitting: Internal Medicine

## 2014-07-20 ENCOUNTER — Other Ambulatory Visit: Payer: Self-pay | Admitting: Internal Medicine

## 2014-07-20 DIAGNOSIS — R911 Solitary pulmonary nodule: Secondary | ICD-10-CM

## 2014-07-27 ENCOUNTER — Other Ambulatory Visit: Payer: Self-pay | Admitting: *Deleted

## 2014-07-27 ENCOUNTER — Other Ambulatory Visit: Payer: Self-pay | Admitting: Internal Medicine

## 2014-07-27 DIAGNOSIS — R053 Chronic cough: Secondary | ICD-10-CM

## 2014-07-27 DIAGNOSIS — R5081 Fever presenting with conditions classified elsewhere: Secondary | ICD-10-CM

## 2014-07-27 DIAGNOSIS — G8929 Other chronic pain: Secondary | ICD-10-CM

## 2014-07-27 DIAGNOSIS — R1013 Epigastric pain: Secondary | ICD-10-CM

## 2014-07-27 DIAGNOSIS — R05 Cough: Secondary | ICD-10-CM

## 2014-07-27 MED ORDER — GUAIFENESIN-CODEINE 100-10 MG/5ML PO SOLN: 5.0000 mL | ORAL | Status: DC | PRN

## 2014-08-02 LAB — FUNGUS CULTURE W SMEAR: Fungal Smear: NONE SEEN

## 2014-08-05 LAB — AFB CULTURE WITH SMEAR (NOT AT ARMC): ACID FAST SMEAR: NONE SEEN

## 2014-08-09 ENCOUNTER — Other Ambulatory Visit: Payer: Self-pay | Admitting: *Deleted

## 2014-08-09 ENCOUNTER — Ambulatory Visit (INDEPENDENT_AMBULATORY_CARE_PROVIDER_SITE_OTHER): Payer: BC Managed Care – PPO | Admitting: Thoracic Surgery (Cardiothoracic Vascular Surgery)

## 2014-08-09 ENCOUNTER — Telehealth: Payer: Self-pay | Admitting: Thoracic Surgery (Cardiothoracic Vascular Surgery)

## 2014-08-09 ENCOUNTER — Encounter: Payer: Self-pay | Admitting: Thoracic Surgery (Cardiothoracic Vascular Surgery)

## 2014-08-09 VITALS — BP 85/59 | HR 76 | Resp 20 | Ht 66.0 in | Wt 113.0 lb

## 2014-08-09 DIAGNOSIS — N186 End stage renal disease: Secondary | ICD-10-CM

## 2014-08-09 DIAGNOSIS — R918 Other nonspecific abnormal finding of lung field: Secondary | ICD-10-CM

## 2014-08-09 DIAGNOSIS — Z992 Dependence on renal dialysis: Secondary | ICD-10-CM

## 2014-08-09 DIAGNOSIS — R911 Solitary pulmonary nodule: Secondary | ICD-10-CM

## 2014-08-09 NOTE — Progress Notes (Signed)
PCP is Reginia Naas, MD Referring Provider is Samuel Jester, MD  Chief Complaint  Patient presents with  . Lung Lesion    Surgical eval,Chest CT 06/30/2014    HPI: [name] is a 58 year old woman with a history of end-stage renal disease on peritoneal dialysis who presents with a chief complaint of low-grade fevers.  Kimberly [name] 58 year old woman with a history of end-stage renal disease. She had a renal transplant back in 2011. That failed about a year later and she has been on peritoneal dialysis since then. She also has a past history of diabetes, hypertension, hypothyroidism, and a heart murmur. She has a brain aneurysm and has had 2 strokes previously. She is a lifelong nonsmoker.  She says that over the past several months she's been having low-grade fevers. This would go up to around 100.6. She had them just about every day. She has a chronic cough that she's had for about 15 years. This is a dry cough. She also has occasional wheezing. As part of her fever workup a chest x-ray was done. That led to a CT of the chest which showed bilateral pulmonary infiltrates left greater than right. There was a 4.1 cm infiltrate in the left apex. She was referred to Dr. Melvyn Novas who did a bronchoscopy. Pathology and cultures were unrevealing.  In addition to the above-noted symptoms she complains of getting short of breath with exertion. She says that she can walk about 10 minutes on level ground. She has chest pain which is usually associated with coughing. She does not have any classic anginal pressure or tightness with exertion. She has had dizzy spells. She also complains of bruising easily. She says that she has had difficulty with low blood pressure especially recently since she was started on a fifth PD exchange daily.   Past Medical History  Diagnosis Date  . Kidney transplanted 01/2010    [facility], has had some rejection  . IgA nephropathy     perviously on peritoneal dialysis  .  Hypothyroidism   . Anemia   . GERD (gastroesophageal reflux disease)   . Immunosuppression   . First degree AV block   . Premature atrial beat   . Premature ventricular beat   . Shingles 03/2011  . Hypertension     history; resolved on dialysis  . Stroke October 2010, March 2013    cerebral aneurysm  . Diabetes mellitus     medication induced with  steroids  . History of chronic cough   . History of hoarseness   . Renal transplant failure and rejection 2012    Past Surgical History  Procedure Laterality Date  . Kidney transplant  01/2010    [facility]  . Ureter revision  04/2011    [facility]  . Umbilical hernia repair  10/25/2009    open with mesh,  Dr [name]  . Capd insertion  08/25/2008    open, Dr [name]  . Capd removal  03/22/2010    Dr [name]  . Eye surgery    . Capd insertion N/A 12/14/2013    Procedure: LAPAROSCOPIC INSERTION CONTINUOUS AMBULATORY PERITONEAL DIALYSIS  (CAPD) CATHETER;  Surgeon: [name], MD;  Location: [place];  Service: General;  Laterality: N/A;  . Insertion of mesh N/A 12/14/2013    Procedure: INSERTION OF MESH;  Surgeon: [name], MD;  Location: [place];  Service: General;  Laterality: N/A;  . Laparoscopic lysis of adhesions N/A 12/14/2013    Procedure: LAPAROSCOPIC LYSIS OF ADHESIONS;  Surgeon: [name], MD;  Location: [place];  Service: General;  Laterality: N/A;  . Ventral hernia repair N/A 12/14/2013    Procedure: LAPAROSCOPIC VENTRAL HERNIA;  Surgeon: [name], MD;  Location: [place];  Service: General;  Laterality: N/A;  . Video bronchoscopy Bilateral 07/05/2014    Procedure: VIDEO BRONCHOSCOPY WITH FLUORO;  Surgeon: [name], MD;  Location: [facility];  Service: Cardiopulmonary;  Laterality: Bilateral;    Family History  Problem Relation Age of Onset  . Coronary artery disease Mother   . Emphysema Mother     smoked  . Stroke Maternal Uncle   . Cancer Mother     ? ovarian CA    Social History History  Substance  Use Topics  . Smoking status: Never Smoker   . Smokeless tobacco: Never Used  . Alcohol Use: No    Current Outpatient Prescriptions  Medication Sig Dispense Refill  . aspirin 81 MG tablet Take 81 mg by mouth daily.    . calcitRIOL (ROCALTROL) 0.5 MCG capsule Take 0.5 mcg by mouth See admin instructions. Monday / Wednesday and Friday 0.80mcg twice daily.    . diazepam (VALIUM) 5 MG tablet Take 5 mg by mouth every 6 (six) hours as needed for anxiety.    Marland Kitchen gentamicin cream (GARAMYCIN) 0.1 %     . guaiFENesin-codeine 100-10 MG/5ML syrup Take 5 mLs by mouth every 4 (four) hours as needed for cough. 120 mL 0  . levothyroxine (SYNTHROID, LEVOTHROID) 50 MCG tablet Take 50 mcg by mouth daily before breakfast.    . OVER THE COUNTER MEDICATION Take 2 capsules by mouth 3 (three) times daily. Juice plus capsules     No current facility-administered medications for this visit.    Allergies  Allergen Reactions  . Dapsone Rash    Pain in feet  . Pregabalin Rash and Other (See Comments)    fever  . Sulfonamide Derivatives Rash  . Tramadol Rash    Rash on cheek    Review of Systems  Constitutional: Positive for fever, activity change, appetite change, fatigue and unexpected weight change. Negative for chills and diaphoresis.  HENT: Positive for congestion.   Respiratory: Positive for cough, shortness of breath and wheezing.   Cardiovascular: Positive for chest pain.  Gastrointestinal:       Hiatal hernia. Nausea, vomiting usually associated with coughing  Skin:       itching  Neurological:       2 previous strokes  Hematological: Bruises/bleeds easily.  All other systems reviewed and are negative.   BP 85/59 mmHg  Pulse 76  Resp 20  Ht 5\' 6"  (1.676 m)  Wt 113 lb (51.256 kg)  BMI 18.25 kg/m2  SpO2 95% Physical Exam  Constitutional: She is oriented to person, place, and time. She appears well-developed and well-nourished. No distress.  HENT:  Head: Normocephalic and atraumatic.   Eyes: EOM are normal. Pupils are equal, round, and reactive to light.  Neck: Neck supple. No thyromegaly present.  Cardiovascular: Normal rate and regular rhythm.   Murmur (2/6 systolic) heard. Pulmonary/Chest: Effort normal and breath sounds normal. She has no wheezes.  Abdominal: There is no tenderness.  Musculoskeletal: She exhibits no edema.  Lymphadenopathy:    She has no cervical adenopathy.  Neurological: She is alert and oriented to person, place, and time. No cranial nerve deficit.  Skin: Skin is warm and dry.  Vitals reviewed.    Diagnostic Tests: CT CHEST WITHOUT CONTRAST  TECHNIQUE: Multidetector  CT imaging of the chest was performed following the standard protocol without IV contrast.  COMPARISON: CT 06/20/2014  FINDINGS: Thoracic aortic is atherosclerotic. Coronary artery disease. Heart size normal. Tiny pericardial effusion.  Sliding hiatal hernia. Esophageal wall thickening is noted. Esophagitis cannot be excluded.  Large airways patent. Multiple ill-defined rounded pulmonary infiltrates are present. Most prominent conglomeration of infiltrates is present in the left pulmonary apex and measures approximately 4.1 cm in maximum diameter. Differential diagnosis includes infectious/ inflammatory infiltrates including atypical/opportunistic infection including granulomas disease.Differential diagnosis also includes metastatic disease. No pleural effusion. No pneumothorax.  Ascites noted. Adrenals unremarkable  Hydronephrosis with right renal atrophy. Left renal atrophy. Renal calcifications are noted  Chest wall is intact. No significant axillary or supraclavicular adenopathy noted. Degenerative changes lumbar spine . Chronic right .  IMPRESSION: 1. Multiple bilateral ill-defined nodular pulmonary infiltrates, the largest conglomerate is in the left upper lobe and measures 4.1 cm in maximum diameter. These could represent  infectious/inflammatory infiltrates including atypical/opportunistic infection including granulomatous disease. Differential diagnosis also includes metastatic disease. 2. Small pericardial effusion. Coronary artery disease. 3. Ascites. 4. Bilateral renal atrophy with chronic right hydronephrosis. 5. Sliding hiatal hernia. Esophageal wall thickening is noted. Esophageal pathology including esophagitis cannot be excluded.   Electronically Signed  By: Marcello Moores Register  On: 06/30/2014 10:27  Impression: 58 year old woman with end-stage renal disease who presents with a persistent low-grade fever and shortness of breath with exertion. Workup has revealed bilateral pulmonary infiltrates left greater than right. Bronchoscopy was nondiagnostic.  The best option at this point would be to go ahead with a left VATS and wedge resection for diagnostic purposes. I discussed the procedure with her in detail. We discussed the general nature of the operation, the need for general anesthesia, the incisions to be used, the expected hospital stay, and the overall recovery. We reviewed the indications, risks, benefits, and alternatives. She understands the risk include, but are not limited to death, MI, stroke, DVT, PE, bleeding, possible need for transfusion, infection, prolonged air leaks, cardiac arrhythmias, as well as the possibility of unforeseeable complications.  She is interested in proceeding with thoracoscopic lung biopsy, but is unsure as to when she wants to do it. 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 She thinks she may want to wait until after the first of year to have the procedure done. I asked her to let her office know when she would like to proceed and we will get it scheduled for her.  Plan: Left VATS, wedge resection. Date to be determined by patient preference

## 2014-08-09 NOTE — Telephone Encounter (Signed)
I called Kimberly [name] to let her know that I had reviewed the CT now that canopy is functioning again.  My recommendation to her is that we proceed with wedge resection of the 4 cm apical infiltrate in the left upper lobe.  She will let us know when she would like to proceed with surgery

## 2014-08-16 ENCOUNTER — Telehealth: Payer: Self-pay | Admitting: *Deleted

## 2014-08-16 NOTE — Telephone Encounter (Signed)
Called the patient to give her the appt for the CT and had to leave a message for her to call the office.   She has an appt for CT Abdomen W/WO Monday 08/22/14 at 1230 and arrive 15 mins before. She will also need to pick up contrast prior to the procedure at the Radiology Department at [facility].

## 2014-08-17 LAB — ACID-FAST (MYCOBACTERIA) SMEAR AND CULTURE WITH REFLEX TO IDENTIFICATION: Acid Fast Smear: NONE SEEN

## 2014-08-19 NOTE — Progress Notes (Signed)
Quick Note:  LMTCB ______ 

## 2014-08-22 ENCOUNTER — Telehealth: Payer: Self-pay | Admitting: *Deleted

## 2014-08-22 ENCOUNTER — Ambulatory Visit (HOSPITAL_COMMUNITY): Payer: BC Managed Care – PPO

## 2014-08-22 NOTE — Telephone Encounter (Signed)
Patient returned Kimberly [name]'s message from last week.  She was scheduled for a CT abdomen w/wo today, but just found out when she called.  Patient was given Radiology Scheduling phone number to reschedule.  She wanted to advise Dr. Baxter Flattery that she is having a lung biopsy done this Thursday.  She will schedule the CT for after surgery. Landis Gandy, RN

## 2014-08-23 ENCOUNTER — Encounter (HOSPITAL_COMMUNITY)
Admission: RE | Admit: 2014-08-23 | Discharge: 2014-08-23 | Disposition: A | Discharge status: Home / Self Care | Payer: BC Managed Care – PPO | Source: Ambulatory Visit | Attending: Thoracic Surgery (Cardiothoracic Vascular Surgery) | Admitting: Thoracic Surgery (Cardiothoracic Vascular Surgery)

## 2014-08-23 ENCOUNTER — Encounter (HOSPITAL_COMMUNITY): Payer: Self-pay

## 2014-08-23 VITALS — BP 95/63 | HR 72 | Temp 98.8°F | Resp 18 | Ht 66.0 in | Wt 113.6 lb

## 2014-08-23 DIAGNOSIS — Z8673 Personal history of transient ischemic attack (TIA), and cerebral infarction without residual deficits: Secondary | ICD-10-CM | POA: Diagnosis not present

## 2014-08-23 DIAGNOSIS — Z940 Kidney transplant status: Secondary | ICD-10-CM | POA: Diagnosis not present

## 2014-08-23 DIAGNOSIS — I313 Pericardial effusion (noninflammatory): Secondary | ICD-10-CM | POA: Diagnosis not present

## 2014-08-23 DIAGNOSIS — N186 End stage renal disease: Secondary | ICD-10-CM | POA: Diagnosis not present

## 2014-08-23 DIAGNOSIS — E119 Type 2 diabetes mellitus without complications: Secondary | ICD-10-CM | POA: Diagnosis not present

## 2014-08-23 DIAGNOSIS — Z992 Dependence on renal dialysis: Secondary | ICD-10-CM | POA: Diagnosis not present

## 2014-08-23 DIAGNOSIS — N261 Atrophy of kidney (terminal): Secondary | ICD-10-CM | POA: Diagnosis not present

## 2014-08-23 DIAGNOSIS — R05 Cough: Secondary | ICD-10-CM | POA: Diagnosis not present

## 2014-08-23 DIAGNOSIS — K219 Gastro-esophageal reflux disease without esophagitis: Secondary | ICD-10-CM | POA: Diagnosis not present

## 2014-08-23 DIAGNOSIS — T8612 Kidney transplant failure: Secondary | ICD-10-CM | POA: Diagnosis not present

## 2014-08-23 DIAGNOSIS — Z9225 Personal history of immunosupression therapy: Secondary | ICD-10-CM | POA: Diagnosis not present

## 2014-08-23 DIAGNOSIS — Z79899 Other long term (current) drug therapy: Secondary | ICD-10-CM | POA: Diagnosis not present

## 2014-08-23 DIAGNOSIS — D649 Anemia, unspecified: Secondary | ICD-10-CM | POA: Diagnosis not present

## 2014-08-23 DIAGNOSIS — R0602 Shortness of breath: Secondary | ICD-10-CM | POA: Diagnosis not present

## 2014-08-23 DIAGNOSIS — R918 Other nonspecific abnormal finding of lung field: Secondary | ICD-10-CM

## 2014-08-23 DIAGNOSIS — I671 Cerebral aneurysm, nonruptured: Secondary | ICD-10-CM | POA: Diagnosis not present

## 2014-08-23 DIAGNOSIS — N029 Recurrent and persistent hematuria with unspecified morphologic changes: Secondary | ICD-10-CM | POA: Diagnosis not present

## 2014-08-23 DIAGNOSIS — R011 Cardiac murmur, unspecified: Secondary | ICD-10-CM | POA: Diagnosis not present

## 2014-08-23 DIAGNOSIS — K449 Diaphragmatic hernia without obstruction or gangrene: Secondary | ICD-10-CM | POA: Diagnosis not present

## 2014-08-23 DIAGNOSIS — I440 Atrioventricular block, first degree: Secondary | ICD-10-CM | POA: Diagnosis not present

## 2014-08-23 DIAGNOSIS — I120 Hypertensive chronic kidney disease with stage 5 chronic kidney disease or end stage renal disease: Secondary | ICD-10-CM | POA: Diagnosis not present

## 2014-08-23 DIAGNOSIS — N1330 Unspecified hydronephrosis: Secondary | ICD-10-CM | POA: Diagnosis not present

## 2014-08-23 DIAGNOSIS — I700 Atherosclerosis of aorta: Secondary | ICD-10-CM | POA: Diagnosis not present

## 2014-08-23 DIAGNOSIS — N2889 Other specified disorders of kidney and ureter: Secondary | ICD-10-CM | POA: Diagnosis not present

## 2014-08-23 DIAGNOSIS — R509 Fever, unspecified: Secondary | ICD-10-CM | POA: Diagnosis not present

## 2014-08-23 DIAGNOSIS — R911 Solitary pulmonary nodule: Secondary | ICD-10-CM | POA: Diagnosis not present

## 2014-08-23 DIAGNOSIS — I2510 Atherosclerotic heart disease of native coronary artery without angina pectoris: Secondary | ICD-10-CM | POA: Diagnosis not present

## 2014-08-23 DIAGNOSIS — E039 Hypothyroidism, unspecified: Secondary | ICD-10-CM | POA: Diagnosis not present

## 2014-08-23 DIAGNOSIS — R188 Other ascites: Secondary | ICD-10-CM | POA: Diagnosis not present

## 2014-08-23 HISTORY — DX: Personal history of other diseases of the digestive system: Z87.19

## 2014-08-23 HISTORY — DX: Cardiac murmur, unspecified: R01.1

## 2014-08-23 LAB — PROTIME-INR
INR: 1.19 (ref 0.00–1.49)
PROTHROMBIN TIME: 15.2 s (ref 11.6–15.2)

## 2014-08-23 LAB — CBC
HCT: 25.5 % — ABNORMAL LOW (ref 36.0–46.0)
HEMOGLOBIN: 7.6 g/dL — AB (ref 12.0–15.0)
MCH: 25.3 pg — AB (ref 26.0–34.0)
MCHC: 29.8 g/dL — ABNORMAL LOW (ref 30.0–36.0)
MCV: 85 fL (ref 78.0–100.0)
Platelets: 369 10*3/uL (ref 150–400)
RBC: 3 MIL/uL — ABNORMAL LOW (ref 3.87–5.11)
RDW: 16.4 % — ABNORMAL HIGH (ref 11.5–15.5)
WBC: 5.4 10*3/uL (ref 4.0–10.5)

## 2014-08-23 LAB — BLOOD GAS, ARTERIAL
Acid-Base Excess: 7.1 mmol/L — ABNORMAL HIGH (ref 0.0–2.0)
BICARBONATE: 29.7 meq/L — AB (ref 20.0–24.0)
Drawn by: 42180
FIO2: 0.21 %
O2 Saturation: 99.1 %
PATIENT TEMPERATURE: 98.6
TCO2: 30.7 mmol/L (ref 0–100)
pCO2 arterial: 31.7 mmHg — ABNORMAL LOW (ref 35.0–45.0)
pH, Arterial: 7.578 — ABNORMAL HIGH (ref 7.350–7.450)
pO2, Arterial: 117 mmHg — ABNORMAL HIGH (ref 80.0–100.0)

## 2014-08-23 LAB — COMPREHENSIVE METABOLIC PANEL
ALK PHOS: 104 U/L (ref 39–117)
ALT: 9 U/L (ref 0–35)
ANION GAP: 19 — AB (ref 5–15)
AST: 11 U/L (ref 0–37)
Albumin: 1.3 g/dL — ABNORMAL LOW (ref 3.5–5.2)
BUN: 35 mg/dL — ABNORMAL HIGH (ref 6–23)
CO2: 26 mEq/L (ref 19–32)
Calcium: 8.3 mg/dL — ABNORMAL LOW (ref 8.4–10.5)
Chloride: 91 mEq/L — ABNORMAL LOW (ref 96–112)
Creatinine, Ser: 9.63 mg/dL — ABNORMAL HIGH (ref 0.50–1.10)
GFR calc non Af Amer: 4 mL/min — ABNORMAL LOW (ref >90)
GFR, EST AFRICAN AMERICAN: 5 mL/min — AB (ref >90)
GLUCOSE: 124 mg/dL — AB (ref 70–99)
POTASSIUM: 2.9 meq/L — AB (ref 3.7–5.3)
Sodium: 136 mEq/L — ABNORMAL LOW (ref 137–147)
Total Bilirubin: <0.2 mg/dL — ABNORMAL LOW (ref 0.3–1.2)
Total Protein: 5.8 g/dL — ABNORMAL LOW (ref 6.0–8.3)

## 2014-08-23 LAB — SURGICAL PCR SCREEN
MRSA, PCR: NEGATIVE
STAPHYLOCOCCUS AUREUS: POSITIVE — AB

## 2014-08-23 LAB — ABO/RH: ABO/RH(D): B POS

## 2014-08-23 LAB — TYPE AND SCREEN
ABO/RH(D): B POS
Antibody Screen: NEGATIVE

## 2014-08-23 LAB — APTT: APTT: 38 s — AB (ref 24–37)

## 2014-08-23 NOTE — Pre-Procedure Instructions (Addendum)
[place]  08/23/2014   Your procedure is scheduled on:  08/25/14  Report to [facility] short stay admitting at 600 AM.  Call this number if you have problems the morning of surgery: [phone]   Remember:   Do not eat food or drink liquids after midnight.   Take these medicines the morning of surgery with A SIP OF WATER: valium if needed, levothyroxine     STOP all herbel meds, nsaids (aleve,naproxen,advil,ibuprofen) starting now including vitamins   Do not wear jewelry, make-up or nail polish.  Do not wear lotions, powders, or perfumes. You may wear deodorant.  Do not shave 48 hours prior to surgery. Men may shave face and neck.  Do not bring valuables to the hospital.  [facility] is not responsible                  for any belongings or valuables.               Contacts, dentures or bridgework may not be worn into surgery.  Leave suitcase in the car. After surgery it may be brought to your room.  For patients admitted to the hospital, discharge time is determined by your                treatment team.               Patients discharged the day of surgery will not be allowed to drive  home.  Name and phone number of your driver:   Special Instructions:  Special Instructions:  - Preparing for Surgery  Before surgery, you can play an important role.  Because skin is not sterile, your skin needs to be as free of germs as possible.  You can reduce the number of germs on you skin by washing with CHG (chlorahexidine gluconate) soap before surgery.  CHG is an antiseptic cleaner which kills germs and bonds with the skin to continue killing germs even after washing.  Please DO NOT use if you have an allergy to CHG or antibacterial soaps.  If your skin becomes reddened/irritated stop using the CHG and inform your nurse when you arrive at Short Stay.  Do not shave (including legs and underarms) for at least 48 hours prior to the first CHG shower.  You may shave your  face.  Please follow these instructions carefully:   1.  Shower with CHG Soap the night before surgery and the morning of Surgery.  2.  If you choose to wash your hair, wash your hair first as usual with your normal shampoo.  3.  After you shampoo, rinse your hair and body thoroughly to remove the Shampoo.  4.  Use CHG as you would any other liquid soap.  You can apply chg directly  to the skin and wash gently with scrungie or a clean washcloth.  5.  Apply the CHG Soap to your body ONLY FROM THE NECK DOWN.  Do not use on open wounds or open sores.  Avoid contact with your eyes ears, mouth and genitals (private parts).  Wash genitals (private parts)       with your normal soap.  6.  Wash thoroughly, paying special attention to the area where your surgery will be performed.  7.  Thoroughly rinse your body with warm water from the neck down.  8.  DO NOT shower/wash with your normal soap after using and rinsing off the CHG Soap.  9.  Pat yourself dry  with a clean towel.            10.  Wear clean pajamas.            11.  Place clean sheets on your bed the night of your first shower and do not sleep with pets.  Day of Surgery  Do not apply any lotions/deodorants the morning of surgery.  Please wear clean clothes to the hospital/surgery center.   Please read over the following fact sheets that you were given: Pain Booklet, Coughing and Deep Breathing, Blood Transfusion Information, MRSA Information and Surgical Site Infection Prevention

## 2014-08-24 MED ORDER — DEXTROSE 5 % IV SOLN
1.5000 g | INTRAVENOUS | Status: DC
  Filled 2014-08-24: qty 1.5

## 2014-08-24 NOTE — Progress Notes (Signed)
Anesthesia Chart Review: Patient is a 58 year old female scheduled for left VATS, wedge resection on 08/25/14 by Dr. Roxan Hockey. She was recently found to have bilateral pulmonary infiltrates, L > R with non-diagnostic bronchoscopy.  Wedge resection was recommended for diagnostic purposes.  History includes IgA nephropathy with PD '09 s/p cadaveric kidney transplant '11 ([facility]) complicated by ureteral anastomosis with nephrostomy tube placement and ureteral revision now with recurrent renal failure on peritoneal dialysis. She is s/p UHR with mesh '11, left parietal CVA 08/2009, DM2, AV block (queston second or third degree) with bradycardia in the setting of hospitalization for diarrhea/colitis related to Cellcept s/p bowel prep 09/2011 with normal stress echo and SR with first degree AVB with occasional PACs/PVCS by event monitor, secondary hyperparathyroidism, hypothyroidism, HTN, asthma, VHR with PD catheter insertion 12/14/13. PCP is Dr. [name]. Nephrologist is Dr. Corliss Parish. Endocrinologist is Dr. Elayne Snare. ID is Dr. Carlyle Basques. Cardiologist is Dr. Thompson Grayer, last visit on 01/17/12 and only PRN follow-up was recommended.  EKG on 08/23/14 showed NSR, non-specific ST/T wave abnormality.    Echo on 04/13/2013 [facility]; scanned under Media tab, Correspondence 12/14/13) showed normal left ventricular systolic function with mild LVH, normal right ventricular systolic function, MR/PR/TR. No valvular stenosis. Stress echo portion was indeterminate but showed normal resting study and no wall motion abnormalities at subtarget heart rate (< 127 BPM, Target = 139 BPM). Maximum work load of 13.40 METS was achieved during exercise. EF was greater than 55%.  CXR report on 08/23/14: No acute finding in patient with bilateral pulmonary nodules and a mass like lesion in the left upper lobe.  Preoperative labs and ABG noted. H/H 7.6/25.5. K+ 2.9. pH 7.578, pCO2 31.7, pO2 117.  Glucose 124. T&S done.   She will get an ISTAT on arrival due to her ESRD. I've called abnormal labs to [facility] [employer].  She will have Dr. Roxan Hockey review for additional recommendations. (Update: Dr. Roxan Hockey just wanted to make sure blood was available. I've asked his office to have an order for T&C entered today, or otherwise by Dr. Roxan Hockey or her assigned anesthesiologist tomorrow.)    Myra Gianotti, PA-C [facility]/Anesthesiology Phone [phone] 08/24/2014 9:38 AM

## 2014-08-24 NOTE — ED Provider Notes (Signed)
Mupirocin Ointment Rx called into [facility] on [address] for positive PCR of staph. Left message on pt's VM informing her of results and that Rx has been called in.

## 2014-08-25 ENCOUNTER — Encounter (HOSPITAL_COMMUNITY): Payer: Self-pay | Admitting: Surgery

## 2014-08-25 ENCOUNTER — Encounter (HOSPITAL_COMMUNITY)
Admission: RE | Disposition: A | Discharge status: Home / Self Care | Payer: Self-pay | Source: Ambulatory Visit | Attending: Thoracic Surgery (Cardiothoracic Vascular Surgery)

## 2014-08-25 ENCOUNTER — Other Ambulatory Visit: Payer: Self-pay | Admitting: *Deleted

## 2014-08-25 ENCOUNTER — Ambulatory Visit (HOSPITAL_COMMUNITY)
Admission: RE | Admit: 2014-08-25 | Discharge: 2014-08-25 | Disposition: A | Discharge status: Home / Self Care | Payer: BC Managed Care – PPO | Source: Ambulatory Visit | Attending: Thoracic Surgery (Cardiothoracic Vascular Surgery) | Admitting: Thoracic Surgery (Cardiothoracic Vascular Surgery)

## 2014-08-25 DIAGNOSIS — I440 Atrioventricular block, first degree: Secondary | ICD-10-CM | POA: Insufficient documentation

## 2014-08-25 DIAGNOSIS — Z9225 Personal history of immunosupression therapy: Secondary | ICD-10-CM | POA: Insufficient documentation

## 2014-08-25 DIAGNOSIS — R0602 Shortness of breath: Secondary | ICD-10-CM | POA: Insufficient documentation

## 2014-08-25 DIAGNOSIS — Z79899 Other long term (current) drug therapy: Secondary | ICD-10-CM | POA: Insufficient documentation

## 2014-08-25 DIAGNOSIS — E119 Type 2 diabetes mellitus without complications: Secondary | ICD-10-CM | POA: Insufficient documentation

## 2014-08-25 DIAGNOSIS — R509 Fever, unspecified: Secondary | ICD-10-CM | POA: Insufficient documentation

## 2014-08-25 DIAGNOSIS — K449 Diaphragmatic hernia without obstruction or gangrene: Secondary | ICD-10-CM | POA: Insufficient documentation

## 2014-08-25 DIAGNOSIS — R05 Cough: Secondary | ICD-10-CM | POA: Insufficient documentation

## 2014-08-25 DIAGNOSIS — R918 Other nonspecific abnormal finding of lung field: Secondary | ICD-10-CM

## 2014-08-25 DIAGNOSIS — N1330 Unspecified hydronephrosis: Secondary | ICD-10-CM | POA: Insufficient documentation

## 2014-08-25 DIAGNOSIS — N261 Atrophy of kidney (terminal): Secondary | ICD-10-CM | POA: Insufficient documentation

## 2014-08-25 DIAGNOSIS — Z8673 Personal history of transient ischemic attack (TIA), and cerebral infarction without residual deficits: Secondary | ICD-10-CM | POA: Insufficient documentation

## 2014-08-25 DIAGNOSIS — R911 Solitary pulmonary nodule: Secondary | ICD-10-CM | POA: Insufficient documentation

## 2014-08-25 DIAGNOSIS — Z992 Dependence on renal dialysis: Secondary | ICD-10-CM | POA: Insufficient documentation

## 2014-08-25 DIAGNOSIS — N186 End stage renal disease: Secondary | ICD-10-CM | POA: Insufficient documentation

## 2014-08-25 DIAGNOSIS — I700 Atherosclerosis of aorta: Secondary | ICD-10-CM | POA: Insufficient documentation

## 2014-08-25 DIAGNOSIS — Z940 Kidney transplant status: Secondary | ICD-10-CM | POA: Insufficient documentation

## 2014-08-25 DIAGNOSIS — E039 Hypothyroidism, unspecified: Secondary | ICD-10-CM | POA: Insufficient documentation

## 2014-08-25 DIAGNOSIS — D649 Anemia, unspecified: Secondary | ICD-10-CM | POA: Insufficient documentation

## 2014-08-25 DIAGNOSIS — I313 Pericardial effusion (noninflammatory): Secondary | ICD-10-CM | POA: Insufficient documentation

## 2014-08-25 DIAGNOSIS — T8612 Kidney transplant failure: Secondary | ICD-10-CM | POA: Insufficient documentation

## 2014-08-25 DIAGNOSIS — I2510 Atherosclerotic heart disease of native coronary artery without angina pectoris: Secondary | ICD-10-CM | POA: Insufficient documentation

## 2014-08-25 DIAGNOSIS — R011 Cardiac murmur, unspecified: Secondary | ICD-10-CM | POA: Insufficient documentation

## 2014-08-25 DIAGNOSIS — N029 Recurrent and persistent hematuria with unspecified morphologic changes: Secondary | ICD-10-CM | POA: Insufficient documentation

## 2014-08-25 DIAGNOSIS — I671 Cerebral aneurysm, nonruptured: Secondary | ICD-10-CM | POA: Insufficient documentation

## 2014-08-25 DIAGNOSIS — N2889 Other specified disorders of kidney and ureter: Secondary | ICD-10-CM | POA: Insufficient documentation

## 2014-08-25 DIAGNOSIS — R188 Other ascites: Secondary | ICD-10-CM | POA: Insufficient documentation

## 2014-08-25 DIAGNOSIS — I120 Hypertensive chronic kidney disease with stage 5 chronic kidney disease or end stage renal disease: Secondary | ICD-10-CM | POA: Insufficient documentation

## 2014-08-25 DIAGNOSIS — K219 Gastro-esophageal reflux disease without esophagitis: Secondary | ICD-10-CM | POA: Insufficient documentation

## 2014-08-25 LAB — POCT I-STAT 4, (NA,K, GLUC, HGB,HCT)
Glucose, Bld: 97 mg/dL (ref 70–99)
HCT: 27 % — ABNORMAL LOW (ref 36.0–46.0)
Hemoglobin: 9.2 g/dL — ABNORMAL LOW (ref 12.0–15.0)
POTASSIUM: 2.4 meq/L — AB (ref 3.7–5.3)
Sodium: 135 mEq/L — ABNORMAL LOW (ref 137–147)

## 2014-08-25 SURGERY — VIDEO ASSISTED THORACOSCOPY (VATS)/WEDGE RESECTION
Anesthesia: General

## 2014-08-25 MED ORDER — ARTIFICIAL TEARS OP OINT
TOPICAL_OINTMENT | OPHTHALMIC | Status: AC
  Filled 2014-08-25: qty 3.5

## 2014-08-25 MED ORDER — MIDAZOLAM HCL 2 MG/2ML IJ SOLN
INTRAMUSCULAR | Status: AC
  Filled 2014-08-25: qty 2

## 2014-08-25 MED ORDER — PROPOFOL 10 MG/ML IV BOLUS
INTRAVENOUS | Status: AC
  Filled 2014-08-25: qty 20

## 2014-08-25 MED ORDER — POTASSIUM CHLORIDE CRYS ER 20 MEQ PO TBCR
20.0000 meq | EXTENDED_RELEASE_TABLET | Freq: Every day | ORAL | Status: DC
  Filled 2014-08-25: qty 1

## 2014-08-25 MED ORDER — FENTANYL CITRATE 0.05 MG/ML IJ SOLN
INTRAMUSCULAR | Status: AC
  Filled 2014-08-25: qty 5

## 2014-08-25 MED ORDER — ROCURONIUM BROMIDE 50 MG/5ML IV SOLN
INTRAVENOUS | Status: AC
  Filled 2014-08-25: qty 1

## 2014-08-25 MED ORDER — POTASSIUM CHLORIDE CRYS ER 20 MEQ PO TBCR: 20.0000 meq | EXTENDED_RELEASE_TABLET | Freq: Every day | ORAL | Status: DC

## 2014-08-25 SURGICAL SUPPLY — 81 items
ADH SKN CLS APL DERMABOND .7 (GAUZE/BANDAGES/DRESSINGS)
APL SKNCLS STERI-STRIP NONHPOA (GAUZE/BANDAGES/DRESSINGS) ×1
BAG SPEC RTRVL LRG 6X4 10 (ENDOMECHANICALS)
BENZOIN TINCTURE PRP APPL 2/3 (GAUZE/BANDAGES/DRESSINGS) ×3 IMPLANT
CANISTER SUCTION 2500CC (MISCELLANEOUS) ×6 IMPLANT
CATH KIT ON Q 5IN SLV (PAIN MANAGEMENT) IMPLANT
CATH THORACIC 28FR (CATHETERS) IMPLANT
CATH THORACIC 36FR (CATHETERS) IMPLANT
CATH THORACIC 36FR RT ANG (CATHETERS) IMPLANT
CLIP TI MEDIUM 6 (CLIP) ×3 IMPLANT
CONN ST 1/4X3/8  BEN (MISCELLANEOUS)
CONN ST 1/4X3/8 BEN (MISCELLANEOUS) IMPLANT
CONN Y 3/8X3/8X3/8  BEN (MISCELLANEOUS)
CONN Y 3/8X3/8X3/8 BEN (MISCELLANEOUS) IMPLANT
CONT SPEC 4OZ CLIKSEAL STRL BL (MISCELLANEOUS) ×6 IMPLANT
COVER SURGICAL LIGHT HANDLE (MISCELLANEOUS) ×3 IMPLANT
DERMABOND ADVANCED (GAUZE/BANDAGES/DRESSINGS)
DERMABOND ADVANCED .7 DNX12 (GAUZE/BANDAGES/DRESSINGS) IMPLANT
DRAIN CHANNEL 28F RND 3/8 FF (WOUND CARE) IMPLANT
DRAIN CHANNEL 32F RND 10.7 FF (WOUND CARE) IMPLANT
DRAPE LAPAROSCOPIC ABDOMINAL (DRAPES) ×3 IMPLANT
DRAPE WARM FLUID 44X44 (DRAPE) ×3 IMPLANT
ELECT BLADE 6.5 EXT (BLADE) ×3 IMPLANT
ELECT REM PT RETURN 9FT ADLT (ELECTROSURGICAL) ×2
ELECTRODE REM PT RTRN 9FT ADLT (ELECTROSURGICAL) ×2 IMPLANT
GAUZE SPONGE 4X4 12PLY STRL (GAUZE/BANDAGES/DRESSINGS) ×3 IMPLANT
GLOVE SURG SIGNA 7.5 PF LTX (GLOVE) ×6 IMPLANT
GOWN STRL REUS W/ TWL LRG LVL3 (GOWN DISPOSABLE) ×4 IMPLANT
GOWN STRL REUS W/ TWL XL LVL3 (GOWN DISPOSABLE) ×4 IMPLANT
GOWN STRL REUS W/TWL LRG LVL3 (GOWN DISPOSABLE) ×4
GOWN STRL REUS W/TWL XL LVL3 (GOWN DISPOSABLE) ×4
HANDLE STAPLE ENDO GIA SHORT (STAPLE)
HEMOSTAT SURGICEL 2X14 (HEMOSTASIS) IMPLANT
KIT BASIN OR (CUSTOM PROCEDURE TRAY) ×3 IMPLANT
KIT ROOM TURNOVER OR (KITS) ×3 IMPLANT
KIT SUCTION CATH 14FR (SUCTIONS) ×3 IMPLANT
NS IRRIG 1000ML POUR BTL (IV SOLUTION) ×12 IMPLANT
PACK CHEST (CUSTOM PROCEDURE TRAY) ×3 IMPLANT
PAD ARMBOARD 7.5X6 YLW CONV (MISCELLANEOUS) ×6 IMPLANT
POUCH ENDO CATCH II 15MM (MISCELLANEOUS) IMPLANT
POUCH SPECIMEN RETRIEVAL 10MM (ENDOMECHANICALS) IMPLANT
SCISSORS ENDO CVD 5DCS (MISCELLANEOUS) IMPLANT
SEALANT PROGEL (MISCELLANEOUS) IMPLANT
SEALANT SURG COSEAL 4ML (VASCULAR PRODUCTS) IMPLANT
SEALANT SURG COSEAL 8ML (VASCULAR PRODUCTS) IMPLANT
SOLUTION ANTI FOG 6CC (MISCELLANEOUS) ×3 IMPLANT
SPECIMEN JAR MEDIUM (MISCELLANEOUS) ×3 IMPLANT
STAPLER ENDO GIA 12 SHRT THIN (STAPLE) IMPLANT
STAPLER ENDO GIA 12MM SHORT (STAPLE) IMPLANT
SUT PROLENE 4 0 RB 1 (SUTURE)
SUT PROLENE 4-0 RB1 .5 CRCL 36 (SUTURE) IMPLANT
SUT SILK  1 MH (SUTURE) ×1
SUT SILK 1 MH (SUTURE) ×2 IMPLANT
SUT SILK 1 TIES 10X30 (SUTURE) IMPLANT
SUT SILK 2 0 SH (SUTURE) IMPLANT
SUT SILK 2 0SH CR/8 30 (SUTURE) IMPLANT
SUT SILK 3 0 SH 30 (SUTURE) IMPLANT
SUT SILK 3 0SH CR/8 30 (SUTURE) IMPLANT
SUT VIC AB 0 CTX 27 (SUTURE) IMPLANT
SUT VIC AB 1 CTX 27 (SUTURE) IMPLANT
SUT VIC AB 2-0 CT1 27 (SUTURE)
SUT VIC AB 2-0 CT1 TAPERPNT 27 (SUTURE) IMPLANT
SUT VIC AB 2-0 CTX 36 (SUTURE) IMPLANT
SUT VIC AB 3-0 MH 27 (SUTURE) IMPLANT
SUT VIC AB 3-0 SH 27 (SUTURE)
SUT VIC AB 3-0 SH 27X BRD (SUTURE) IMPLANT
SUT VIC AB 3-0 X1 27 (SUTURE) ×6 IMPLANT
SUT VICRYL 0 UR6 27IN ABS (SUTURE) ×6 IMPLANT
SUT VICRYL 2 TP 1 (SUTURE) IMPLANT
SWAB COLLECTION DEVICE MRSA (MISCELLANEOUS) IMPLANT
SYSTEM SAHARA CHEST DRAIN ATS (WOUND CARE) ×3 IMPLANT
TIP APPLICATOR SPRAY EXTEND 16 (VASCULAR PRODUCTS) IMPLANT
TOWEL OR 17X24 6PK STRL BLUE (TOWEL DISPOSABLE) ×3 IMPLANT
TOWEL OR 17X26 10 PK STRL BLUE (TOWEL DISPOSABLE) ×6 IMPLANT
TRAP SPECIMEN MUCOUS 40CC (MISCELLANEOUS) IMPLANT
TRAY FOLEY CATH 16FRSI W/METER (SET/KITS/TRAYS/PACK) ×3 IMPLANT
TROCAR XCEL BLADELESS 5X75MML (TROCAR) ×3 IMPLANT
TROCAR XCEL NON-BLD 5MMX100MML (ENDOMECHANICALS) IMPLANT
TUBE ANAEROBIC SPECIMEN COL (MISCELLANEOUS) IMPLANT
TUNNELER SHEATH ON-Q 11GX8 DSP (PAIN MANAGEMENT) IMPLANT
WATER STERILE IRR 1000ML POUR (IV SOLUTION) ×6 IMPLANT

## 2014-08-25 NOTE — Anesthesia Preprocedure Evaluation (Addendum)
Anesthesia Evaluation  Patient identified by MRN, date of birth, ID band Patient awake    Reviewed: Allergy & Precautions, H&P , NPO status , Patient's Chart, lab work & pertinent test results  History of Anesthesia Complications Negative for: history of anesthetic complications  Airway Mallampati: I  TM Distance: >3 FB Neck ROM: Full    Dental  (+) Teeth Intact   Pulmonary neg sleep apnea, neg COPDneg recent URI,  breath sounds clear to auscultation        Cardiovascular - angina- CAD, - Past MI and - CHF + dysrhythmias Rhythm:Regular     Neuro/Psych CVA, No Residual Symptoms negative psych ROS   GI/Hepatic Neg liver ROS, hiatal hernia, GERD-  ,  Endo/Other  diabetes, Type 2Hypothyroidism   Renal/GU CRF and DialysisRenal disease     Musculoskeletal   Abdominal   Peds  Hematology  (+) anemia ,   Anesthesia Other Findings   Reproductive/Obstetrics                            Anesthesia Physical Anesthesia Plan  ASA: III  Anesthesia Plan: General   Post-op Pain Management:    Induction: Intravenous  Airway Management Planned: Oral ETT and Double Lumen EBT  Additional Equipment: Arterial line  Intra-op Plan:   Post-operative Plan: Possible Post-op intubation/ventilation and Extubation in OR  Informed Consent: I have reviewed the patients History and Physical, chart, labs and discussed the procedure including the risks, benefits and alternatives for the proposed anesthesia with the patient or authorized representative who has indicated his/her understanding and acceptance.     Plan Discussed with: CRNA, Anesthesiologist and Surgeon  Anesthesia Plan Comments:        Anesthesia Quick Evaluation

## 2014-08-25 NOTE — Progress Notes (Signed)
Sharyn Lull (Quarry manager) gave ISTAT results to Nurse and potassium was 2.4. Nurse then found Dr. Tamala Julian (Anesthesia) and informed him of this. Dr. Tamala Julian in to see patient and informed patient that potassium was too low to perform surgery today, and he requested that Nurse call Dr. Roxan Hockey and inform him of this. Nurse called Dr. Roxan Hockey and informed him of potassium level and Dr. Thompson Caul talk with the patient. Dr. Roxan Hockey stated that patient needed to have some IV potassium. Nurse then explained to Dr. Roxan Hockey that patient stated she was unable to tolerate IV potassium, and Dr. Roxan Hockey stated that her surgery would need to be cancelled, and he had a meeting that he could not miss, however he would be at short stay in a hour. Nurse then asked Dr. Roxan Hockey if it was ok if his next case was moved up, and Dr. Roxan Hockey stated that would be fine.  Will update patient on plan of care.

## 2014-08-25 NOTE — Interval H&P Note (Signed)
History and Physical Interval Note: Potassium was 2.9 on preop labs This morning her repeat K was 2.4! Dr. Tamala Julian does not feel it is safe to put her under GA with that degree of hypokalemia I agree Discussed with [name] She has recently increased her PD from 4X to 5X/ day I will give her a potassium supplement She will discuss PD frequency with her Nephrologist Will arrange for a BMET next Monday Hopefully can reschedule for next week 08/25/2014 7:55 AM  Coward  has presented today for surgery, with the diagnosis of BILATERAL INFILTRATES  The various methods of treatment have been discussed with the patient and family. After consideration of risks, benefits and other options for treatment, the patient has consented to  Procedure(s): VIDEO ASSISTED THORACOSCOPY (VATS)/WEDGE RESECTION (Left) as a surgical intervention .  The patient's history has been reviewed, patient examined, no change in status, stable for surgery.  I have reviewed the patient's chart and labs.  Questions were answered to the patient's satisfaction.     Kimberly [name] C

## 2014-08-25 NOTE — Progress Notes (Signed)
Dr. Roxan Hockey called in a prescription for potassium to patients pharmacy and stated if the prescription was not there by 1000 for patient to call his office and let them know. Patient verbalized understanding. Patient discharged to home. All patient belongings taken with patient.

## 2014-08-25 NOTE — Progress Notes (Signed)
Nurse in to see patient and informed her of conversation with Dr. Roxan Hockey. Patient verbalized understanding.  Currently patient is resting in stretcher awaiting Dr. Leonarda Salon arrival . Mother in law back at chair side.

## 2014-08-25 NOTE — H&P (View-Only) (Signed)
PCP is Reginia Naas, MD Referring Provider is Samuel Jester, MD  Chief Complaint  Patient presents with  . Lung Lesion    Surgical eval,Chest CT 06/30/2014    HPI: Kimberly [name] is a 58 year old woman with a history of end-stage renal disease on peritoneal dialysis who presents with a chief complaint of low-grade fevers.  Kimberly [name] 58 year old woman with a history of end-stage renal disease. She had a renal transplant back in 2011. That failed about a year later and she has been on peritoneal dialysis since then. She also has a past history of diabetes, hypertension, hypothyroidism, and a heart murmur. She has a brain aneurysm and has had 2 strokes previously. She is a lifelong nonsmoker.  She says that over the past several months she's been having low-grade fevers. This would go up to around 100.6. She had them just about every day. She has a chronic cough that she's had for about 15 years. This is a dry cough. She also has occasional wheezing. As part of her fever workup a chest x-ray was done. That led to a CT of the chest which showed bilateral pulmonary infiltrates left greater than right. There was a 4.1 cm infiltrate in the left apex. She was referred to Dr. Melvyn Novas who did a bronchoscopy. Pathology and cultures were unrevealing.  In addition to the above-noted symptoms she complains of getting short of breath with exertion. She says that she can walk about 10 minutes on level ground. She has chest pain which is usually associated with coughing. She does not have any classic anginal pressure or tightness with exertion. She has had dizzy spells. She also complains of bruising easily. She says that she has had difficulty with low blood pressure especially recently since she was started on a fifth PD exchange daily.   Past Medical History  Diagnosis Date  . Kidney transplanted 01/2010    [facility], has had some rejection  . IgA nephropathy     perviously on peritoneal dialysis  .  Hypothyroidism   . Anemia   . GERD (gastroesophageal reflux disease)   . Immunosuppression   . First degree AV block   . Premature atrial beat   . Premature ventricular beat   . Shingles 03/2011  . Hypertension     history; resolved on dialysis  . Stroke October 2010, March 2013    cerebral aneurysm  . Diabetes mellitus     medication induced with  steroids  . History of chronic cough   . History of hoarseness   . Renal transplant failure and rejection 2012    Past Surgical History  Procedure Laterality Date  . Kidney transplant  01/2010    [facility]  . Ureter revision  04/2011    [facility]  . Umbilical hernia repair  10/25/2009    open with mesh,  Dr [name]  . Capd insertion  08/25/2008    open, Dr [name]  . Capd removal  03/22/2010    Dr [name]  . Eye surgery    . Capd insertion N/A 12/14/2013    Procedure: LAPAROSCOPIC INSERTION CONTINUOUS AMBULATORY PERITONEAL DIALYSIS  (CAPD) CATHETER;  Surgeon: [name], MD;  Location: [place];  Service: General;  Laterality: N/A;  . Insertion of mesh N/A 12/14/2013    Procedure: INSERTION OF MESH;  Surgeon: [name], MD;  Location: [place];  Service: General;  Laterality: N/A;  . Laparoscopic lysis of adhesions N/A 12/14/2013    Procedure: LAPAROSCOPIC LYSIS OF ADHESIONS;  Surgeon: [name], MD;  Location: [place];  Service: General;  Laterality: N/A;  . Ventral hernia repair N/A 12/14/2013    Procedure: LAPAROSCOPIC VENTRAL HERNIA;  Surgeon: [name], MD;  Location: [place];  Service: General;  Laterality: N/A;  . Video bronchoscopy Bilateral 07/05/2014    Procedure: VIDEO BRONCHOSCOPY WITH FLUORO;  Surgeon: [name], MD;  Location: [facility];  Service: Cardiopulmonary;  Laterality: Bilateral;    Family History  Problem Relation Age of Onset  . Coronary artery disease Mother   . Emphysema Mother     smoked  . Stroke Maternal Uncle   . Cancer Mother     ? ovarian CA    Social History History  Substance  Use Topics  . Smoking status: Never Smoker   . Smokeless tobacco: Never Used  . Alcohol Use: No    Current Outpatient Prescriptions  Medication Sig Dispense Refill  . aspirin 81 MG tablet Take 81 mg by mouth daily.    . calcitRIOL (ROCALTROL) 0.5 MCG capsule Take 0.5 mcg by mouth See admin instructions. Monday / Wednesday and Friday 0.69mcg twice daily.    . diazepam (VALIUM) 5 MG tablet Take 5 mg by mouth every 6 (six) hours as needed for anxiety.    Marland Kitchen gentamicin cream (GARAMYCIN) 0.1 %     . guaiFENesin-codeine 100-10 MG/5ML syrup Take 5 mLs by mouth every 4 (four) hours as needed for cough. 120 mL 0  . levothyroxine (SYNTHROID, LEVOTHROID) 50 MCG tablet Take 50 mcg by mouth daily before breakfast.    . OVER THE COUNTER MEDICATION Take 2 capsules by mouth 3 (three) times daily. Juice plus capsules     No current facility-administered medications for this visit.    Allergies  Allergen Reactions  . Dapsone Rash    Pain in feet  . Pregabalin Rash and Other (See Comments)    fever  . Sulfonamide Derivatives Rash  . Tramadol Rash    Rash on cheek    Review of Systems  Constitutional: Positive for fever, activity change, appetite change, fatigue and unexpected weight change. Negative for chills and diaphoresis.  HENT: Positive for congestion.   Respiratory: Positive for cough, shortness of breath and wheezing.   Cardiovascular: Positive for chest pain.  Gastrointestinal:       Hiatal hernia. Nausea, vomiting usually associated with coughing  Skin:       itching  Neurological:       2 previous strokes  Hematological: Bruises/bleeds easily.  All other systems reviewed and are negative.   BP 85/59 mmHg  Pulse 76  Resp 20  Ht 5\' 6"  (1.676 m)  Wt 113 lb (51.256 kg)  BMI 18.25 kg/m2  SpO2 95% Physical Exam  Constitutional: She is oriented to person, place, and time. She appears well-developed and well-nourished. No distress.  HENT:  Head: Normocephalic and atraumatic.   Eyes: EOM are normal. Pupils are equal, round, and reactive to light.  Neck: Neck supple. No thyromegaly present.  Cardiovascular: Normal rate and regular rhythm.   Murmur (2/6 systolic) heard. Pulmonary/Chest: Effort normal and breath sounds normal. She has no wheezes.  Abdominal: There is no tenderness.  Musculoskeletal: She exhibits no edema.  Lymphadenopathy:    She has no cervical adenopathy.  Neurological: She is alert and oriented to person, place, and time. No cranial nerve deficit.  Skin: Skin is warm and dry.  Vitals reviewed.    Diagnostic Tests: CT CHEST WITHOUT CONTRAST  TECHNIQUE: Multidetector  CT imaging of the chest was performed following the standard protocol without IV contrast.  COMPARISON: CT 06/20/2014  FINDINGS: Thoracic aortic is atherosclerotic. Coronary artery disease. Heart size normal. Tiny pericardial effusion.  Sliding hiatal hernia. Esophageal wall thickening is noted. Esophagitis cannot be excluded.  Large airways patent. Multiple ill-defined rounded pulmonary infiltrates are present. Most prominent conglomeration of infiltrates is present in the left pulmonary apex and measures approximately 4.1 cm in maximum diameter. Differential diagnosis includes infectious/ inflammatory infiltrates including atypical/opportunistic infection including granulomas disease.Differential diagnosis also includes metastatic disease. No pleural effusion. No pneumothorax.  Ascites noted. Adrenals unremarkable  Hydronephrosis with right renal atrophy. Left renal atrophy. Renal calcifications are noted  Chest wall is intact. No significant axillary or supraclavicular adenopathy noted. Degenerative changes lumbar spine . Chronic right .  IMPRESSION: 1. Multiple bilateral ill-defined nodular pulmonary infiltrates, the largest conglomerate is in the left upper lobe and measures 4.1 cm in maximum diameter. These could represent  infectious/inflammatory infiltrates including atypical/opportunistic infection including granulomatous disease. Differential diagnosis also includes metastatic disease. 2. Small pericardial effusion. Coronary artery disease. 3. Ascites. 4. Bilateral renal atrophy with chronic right hydronephrosis. 5. Sliding hiatal hernia. Esophageal wall thickening is noted. Esophageal pathology including esophagitis cannot be excluded.   Electronically Signed  By: Marcello Moores Register  On: 06/30/2014 10:27  Impression: 59 year old woman with end-stage renal disease who presents with a persistent low-grade fever and shortness of breath with exertion. Workup has revealed bilateral pulmonary infiltrates left greater than right. Bronchoscopy was nondiagnostic.  The best option at this point would be to go ahead with a left VATS and wedge resection for diagnostic purposes. I discussed the procedure with her in detail. We discussed the general nature of the operation, the need for general anesthesia, the incisions to be used, the expected hospital stay, and the overall recovery. We reviewed the indications, risks, benefits, and alternatives. She understands the risk include, but are not limited to death, MI, stroke, DVT, PE, bleeding, possible need for transfusion, infection, prolonged air leaks, cardiac arrhythmias, as well as the possibility of unforeseeable complications.  She is interested in proceeding with thoracoscopic lung biopsy, but is unsure as to when she wants to do it. 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 She thinks she may want to wait until after the first of year to have the procedure done. I asked her to let her office know when she would like to proceed and we will get it scheduled for her.  Plan: Left VATS, wedge resection. Date to be determined by patient preference

## 2014-08-29 ENCOUNTER — Other Ambulatory Visit: Payer: Self-pay | Admitting: *Deleted

## 2014-08-29 ENCOUNTER — Inpatient Hospital Stay (HOSPITAL_COMMUNITY)
Admission: RE | Admit: 2014-08-29 | Discharge: 2014-09-05 | DRG: 163 | Disposition: A | Discharge status: Home / Self Care | Payer: BC Managed Care – PPO | Source: Ambulatory Visit | Attending: Thoracic Surgery (Cardiothoracic Vascular Surgery) | Admitting: Thoracic Surgery (Cardiothoracic Vascular Surgery)

## 2014-08-29 ENCOUNTER — Encounter (HOSPITAL_COMMUNITY): Payer: Self-pay

## 2014-08-29 ENCOUNTER — Encounter (HOSPITAL_COMMUNITY)
Admission: RE | Admit: 2014-08-29 | Discharge: 2014-08-29 | Disposition: A | Discharge status: Home / Self Care | Payer: BC Managed Care – PPO | Source: Ambulatory Visit | Attending: Thoracic Surgery (Cardiothoracic Vascular Surgery) | Admitting: Thoracic Surgery (Cardiothoracic Vascular Surgery)

## 2014-08-29 ENCOUNTER — Telehealth: Payer: Self-pay | Admitting: *Deleted

## 2014-08-29 DIAGNOSIS — K210 Gastro-esophageal reflux disease with esophagitis: Secondary | ICD-10-CM | POA: Diagnosis present

## 2014-08-29 DIAGNOSIS — Z882 Allergy status to sulfonamides status: Secondary | ICD-10-CM

## 2014-08-29 DIAGNOSIS — D500 Iron deficiency anemia secondary to blood loss (chronic): Secondary | ICD-10-CM | POA: Diagnosis present

## 2014-08-29 DIAGNOSIS — R918 Other nonspecific abnormal finding of lung field: Principal | ICD-10-CM | POA: Diagnosis present

## 2014-08-29 DIAGNOSIS — N029 Recurrent and persistent hematuria with unspecified morphologic changes: Secondary | ICD-10-CM | POA: Diagnosis present

## 2014-08-29 DIAGNOSIS — I959 Hypotension, unspecified: Secondary | ICD-10-CM | POA: Diagnosis present

## 2014-08-29 DIAGNOSIS — Z940 Kidney transplant status: Secondary | ICD-10-CM | POA: Diagnosis not present

## 2014-08-29 DIAGNOSIS — K439 Ventral hernia without obstruction or gangrene: Secondary | ICD-10-CM | POA: Diagnosis present

## 2014-08-29 DIAGNOSIS — R911 Solitary pulmonary nodule: Secondary | ICD-10-CM | POA: Diagnosis present

## 2014-08-29 DIAGNOSIS — K449 Diaphragmatic hernia without obstruction or gangrene: Secondary | ICD-10-CM | POA: Diagnosis present

## 2014-08-29 DIAGNOSIS — Z888 Allergy status to other drugs, medicaments and biological substances status: Secondary | ICD-10-CM

## 2014-08-29 DIAGNOSIS — Z9689 Presence of other specified functional implants: Secondary | ICD-10-CM

## 2014-08-29 DIAGNOSIS — I2510 Atherosclerotic heart disease of native coronary artery without angina pectoris: Secondary | ICD-10-CM | POA: Diagnosis present

## 2014-08-29 DIAGNOSIS — Z8673 Personal history of transient ischemic attack (TIA), and cerebral infarction without residual deficits: Secondary | ICD-10-CM

## 2014-08-29 DIAGNOSIS — D62 Acute posthemorrhagic anemia: Secondary | ICD-10-CM | POA: Diagnosis not present

## 2014-08-29 DIAGNOSIS — I120 Hypertensive chronic kidney disease with stage 5 chronic kidney disease or end stage renal disease: Secondary | ICD-10-CM | POA: Diagnosis present

## 2014-08-29 DIAGNOSIS — R188 Other ascites: Secondary | ICD-10-CM | POA: Diagnosis present

## 2014-08-29 DIAGNOSIS — Z992 Dependence on renal dialysis: Secondary | ICD-10-CM | POA: Diagnosis not present

## 2014-08-29 DIAGNOSIS — Z01812 Encounter for preprocedural laboratory examination: Secondary | ICD-10-CM | POA: Insufficient documentation

## 2014-08-29 DIAGNOSIS — Z902 Acquired absence of lung [part of]: Secondary | ICD-10-CM

## 2014-08-29 DIAGNOSIS — J9382 Other air leak: Secondary | ICD-10-CM | POA: Diagnosis present

## 2014-08-29 DIAGNOSIS — I313 Pericardial effusion (noninflammatory): Secondary | ICD-10-CM | POA: Diagnosis present

## 2014-08-29 DIAGNOSIS — E119 Type 2 diabetes mellitus without complications: Secondary | ICD-10-CM | POA: Diagnosis present

## 2014-08-29 DIAGNOSIS — Z7982 Long term (current) use of aspirin: Secondary | ICD-10-CM

## 2014-08-29 DIAGNOSIS — N186 End stage renal disease: Secondary | ICD-10-CM | POA: Diagnosis present

## 2014-08-29 DIAGNOSIS — N2581 Secondary hyperparathyroidism of renal origin: Secondary | ICD-10-CM | POA: Diagnosis present

## 2014-08-29 DIAGNOSIS — Z09 Encounter for follow-up examination after completed treatment for conditions other than malignant neoplasm: Secondary | ICD-10-CM

## 2014-08-29 DIAGNOSIS — E039 Hypothyroidism, unspecified: Secondary | ICD-10-CM | POA: Diagnosis present

## 2014-08-29 LAB — BASIC METABOLIC PANEL
Anion gap: 16 — ABNORMAL HIGH (ref 5–15)
BUN: 33 mg/dL — AB (ref 6–23)
CHLORIDE: 93 meq/L — AB (ref 96–112)
CO2: 29 meq/L (ref 19–32)
CREATININE: 8.77 mg/dL — AB (ref 0.50–1.10)
Calcium: 8.7 mg/dL (ref 8.4–10.5)
GFR calc Af Amer: 5 mL/min — ABNORMAL LOW (ref >90)
GFR calc non Af Amer: 4 mL/min — ABNORMAL LOW (ref >90)
GLUCOSE: 148 mg/dL — AB (ref 70–99)
Potassium: 3.1 mEq/L — ABNORMAL LOW (ref 3.7–5.3)
Sodium: 138 mEq/L (ref 137–147)

## 2014-08-29 LAB — TYPE AND SCREEN
ABO/RH(D): B POS
Antibody Screen: NEGATIVE

## 2014-08-29 NOTE — Progress Notes (Signed)
Nurse called Thurmond Butts, RN at Dr. Leonarda Salon office and informed her of patients potassium being 3.1. Thurmond Butts stated she would inform Dr. Roxan Hockey of this.

## 2014-08-29 NOTE — Progress Notes (Signed)
Patient denied having any new changes since Friday except the addition of two medications. Patient informed Nurse that she had enough CHG soap left therefore a new bottle of CHG was not given and charge was removed. Nurse called Thurmond Butts before patient arrived to PAT and Thurmond Butts stated that Dr. Roxan Hockey wanted a repeat on patients BMET today and all other labs were OK. Nurse ordered for patient to have another type and screen since patient had removed blue arm bracelet. Patient requested that Nurse call her with potassium results so she could stop worrying. Nurse took patients number and my direct number was also given.

## 2014-08-29 NOTE — Telephone Encounter (Signed)
Patient was made aware of potassium level of 3.1 today.  Dr. Roxan Hockey wants this re-drawn on Wednesday 12/9.  She is to continue her potassium pills up until day of surgery.  She understands all of this.  Told her to call me if any other questions come up.

## 2014-08-29 NOTE — Pre-Procedure Instructions (Signed)
[place]  08/29/2014   Your procedure is scheduled on:  Thursday September 01, 2014 at 12:00 PM.  Report to [facility] Admitting at 10:00 AM.  Call this number if you have problems the morning of surgery: [phone]   Remember:   Do not eat food or drink liquids after midnight.   Take these medicines the morning of surgery with A SIP OF WATER: Diazepam (Valium) if needed, Levothyroxine (Synthroid)   Do not wear jewelry, make-up or nail polish.  Do not wear lotions, powders, or perfumes.   Do not shave 48 hours prior to surgery.   Do not bring valuables to the hospital.  [facility] is not responsible or any belongings or valuables.               Contacts, dentures or bridgework may not be worn into surgery.  Leave suitcase in the car. After surgery it may be brought to your room.  For patients admitted to the hospital, discharge time is determined by your treatment team.               Patients discharged the day of surgery will not be allowed to drive home.  Name and phone number of your driver:   Special Instructions: Shower the night before and the morning of your surgery using CHG soap   Please read over the following fact sheets that you were given: Pain Booklet, Coughing and Deep Breathing and Surgical Site Infection Prevention

## 2014-08-31 LAB — BASIC METABOLIC PANEL
BUN: 30 mg/dL — ABNORMAL HIGH (ref 6–23)
CHLORIDE: 93 meq/L — AB (ref 96–112)
CO2: 28 meq/L (ref 19–32)
Calcium: 8.6 mg/dL (ref 8.4–10.5)
Creat: 8.21 mg/dL — ABNORMAL HIGH (ref 0.50–1.10)
Glucose, Bld: 146 mg/dL — ABNORMAL HIGH (ref 70–99)
POTASSIUM: 3.7 meq/L (ref 3.5–5.3)
Sodium: 136 mEq/L (ref 135–145)

## 2014-08-31 NOTE — Progress Notes (Signed)
Patient made aware of surgery time change to 1100 AM and arrival time of 0900 AM.

## 2014-09-01 ENCOUNTER — Inpatient Hospital Stay (HOSPITAL_COMMUNITY): Payer: BC Managed Care – PPO | Admitting: Vascular Surgery

## 2014-09-01 ENCOUNTER — Inpatient Hospital Stay (HOSPITAL_COMMUNITY): Payer: BC Managed Care – PPO | Admitting: Anesthesiology

## 2014-09-01 ENCOUNTER — Inpatient Hospital Stay (HOSPITAL_COMMUNITY): Payer: BC Managed Care – PPO

## 2014-09-01 ENCOUNTER — Encounter (HOSPITAL_COMMUNITY)
Admission: RE | Disposition: A | Discharge status: Home / Self Care | Payer: Self-pay | Source: Ambulatory Visit | Attending: Thoracic Surgery (Cardiothoracic Vascular Surgery)

## 2014-09-01 ENCOUNTER — Encounter (HOSPITAL_COMMUNITY): Payer: Self-pay | Admitting: *Deleted

## 2014-09-01 DIAGNOSIS — I2510 Atherosclerotic heart disease of native coronary artery without angina pectoris: Secondary | ICD-10-CM | POA: Diagnosis present

## 2014-09-01 DIAGNOSIS — Z992 Dependence on renal dialysis: Secondary | ICD-10-CM | POA: Diagnosis not present

## 2014-09-01 DIAGNOSIS — D62 Acute posthemorrhagic anemia: Secondary | ICD-10-CM | POA: Diagnosis not present

## 2014-09-01 DIAGNOSIS — Z882 Allergy status to sulfonamides status: Secondary | ICD-10-CM | POA: Diagnosis not present

## 2014-09-01 DIAGNOSIS — Z7982 Long term (current) use of aspirin: Secondary | ICD-10-CM | POA: Diagnosis not present

## 2014-09-01 DIAGNOSIS — K449 Diaphragmatic hernia without obstruction or gangrene: Secondary | ICD-10-CM | POA: Diagnosis present

## 2014-09-01 DIAGNOSIS — N029 Recurrent and persistent hematuria with unspecified morphologic changes: Secondary | ICD-10-CM | POA: Diagnosis present

## 2014-09-01 DIAGNOSIS — I313 Pericardial effusion (noninflammatory): Secondary | ICD-10-CM | POA: Diagnosis present

## 2014-09-01 DIAGNOSIS — R911 Solitary pulmonary nodule: Secondary | ICD-10-CM

## 2014-09-01 DIAGNOSIS — E039 Hypothyroidism, unspecified: Secondary | ICD-10-CM | POA: Diagnosis present

## 2014-09-01 DIAGNOSIS — N2581 Secondary hyperparathyroidism of renal origin: Secondary | ICD-10-CM | POA: Diagnosis present

## 2014-09-01 DIAGNOSIS — K210 Gastro-esophageal reflux disease with esophagitis: Secondary | ICD-10-CM | POA: Diagnosis present

## 2014-09-01 DIAGNOSIS — I959 Hypotension, unspecified: Secondary | ICD-10-CM | POA: Diagnosis present

## 2014-09-01 DIAGNOSIS — R918 Other nonspecific abnormal finding of lung field: Secondary | ICD-10-CM | POA: Diagnosis present

## 2014-09-01 DIAGNOSIS — J9382 Other air leak: Secondary | ICD-10-CM | POA: Diagnosis present

## 2014-09-01 DIAGNOSIS — N186 End stage renal disease: Secondary | ICD-10-CM | POA: Diagnosis present

## 2014-09-01 DIAGNOSIS — D500 Iron deficiency anemia secondary to blood loss (chronic): Secondary | ICD-10-CM | POA: Diagnosis present

## 2014-09-01 DIAGNOSIS — Z940 Kidney transplant status: Secondary | ICD-10-CM | POA: Diagnosis not present

## 2014-09-01 DIAGNOSIS — Z888 Allergy status to other drugs, medicaments and biological substances status: Secondary | ICD-10-CM | POA: Diagnosis not present

## 2014-09-01 DIAGNOSIS — K439 Ventral hernia without obstruction or gangrene: Secondary | ICD-10-CM | POA: Diagnosis present

## 2014-09-01 DIAGNOSIS — I120 Hypertensive chronic kidney disease with stage 5 chronic kidney disease or end stage renal disease: Secondary | ICD-10-CM | POA: Diagnosis present

## 2014-09-01 DIAGNOSIS — Z8673 Personal history of transient ischemic attack (TIA), and cerebral infarction without residual deficits: Secondary | ICD-10-CM | POA: Diagnosis not present

## 2014-09-01 DIAGNOSIS — E119 Type 2 diabetes mellitus without complications: Secondary | ICD-10-CM | POA: Diagnosis present

## 2014-09-01 DIAGNOSIS — R188 Other ascites: Secondary | ICD-10-CM | POA: Diagnosis present

## 2014-09-01 HISTORY — PX: VIDEO ASSISTED THORACOSCOPY (VATS)/WEDGE RESECTION: SHX6174

## 2014-09-01 LAB — GLUCOSE, CAPILLARY
GLUCOSE-CAPILLARY: 84 mg/dL (ref 70–99)
Glucose-Capillary: 81 mg/dL (ref 70–99)

## 2014-09-01 LAB — POCT I-STAT 4, (NA,K, GLUC, HGB,HCT)
Glucose, Bld: 93 mg/dL (ref 70–99)
HCT: 26 % — ABNORMAL LOW (ref 36.0–46.0)
Hemoglobin: 8.8 g/dL — ABNORMAL LOW (ref 12.0–15.0)
Potassium: 3.8 mEq/L (ref 3.7–5.3)
Sodium: 135 mEq/L — ABNORMAL LOW (ref 137–147)

## 2014-09-01 SURGERY — VIDEO ASSISTED THORACOSCOPY (VATS)/WEDGE RESECTION
Anesthesia: General | Site: Chest | Laterality: Left

## 2014-09-01 MED ORDER — PROPOFOL 10 MG/ML IV BOLUS
INTRAVENOUS | Status: DC | PRN
  Administered 2014-09-01: 120 mg via INTRAVENOUS

## 2014-09-01 MED ORDER — FENTANYL CITRATE 0.05 MG/ML IJ SOLN
INTRAMUSCULAR | Status: AC
  Administered 2014-09-01: 25 ug via INTRAVENOUS
  Filled 2014-09-01: qty 2

## 2014-09-01 MED ORDER — ROCURONIUM BROMIDE 50 MG/5ML IV SOLN
INTRAVENOUS | Status: AC
  Filled 2014-09-01: qty 1

## 2014-09-01 MED ORDER — DIPHENHYDRAMINE HCL 50 MG/ML IJ SOLN: 12.5000 mg | Freq: Four times a day (QID) | INTRAMUSCULAR | Status: DC | PRN

## 2014-09-01 MED ORDER — DARBEPOETIN ALFA 60 MCG/0.3ML IJ SOSY
60.0000 ug | PREFILLED_SYRINGE | INTRAMUSCULAR | Status: DC
  Filled 2014-09-01: qty 0.3

## 2014-09-01 MED ORDER — ONDANSETRON HCL 4 MG/2ML IJ SOLN: 4.0000 mg | Freq: Four times a day (QID) | INTRAMUSCULAR | Status: DC | PRN

## 2014-09-01 MED ORDER — OXYCODONE HCL 5 MG/5ML PO SOLN: 5.0000 mg | Freq: Once | ORAL | Status: DC | PRN

## 2014-09-01 MED ORDER — EPHEDRINE SULFATE 50 MG/ML IJ SOLN
INTRAMUSCULAR | Status: AC
  Filled 2014-09-01: qty 1

## 2014-09-01 MED ORDER — DIPHENHYDRAMINE HCL 12.5 MG/5ML PO ELIX
12.5000 mg | ORAL_SOLUTION | Freq: Four times a day (QID) | ORAL | Status: DC | PRN
  Filled 2014-09-01: qty 5

## 2014-09-01 MED ORDER — LIDOCAINE HCL (CARDIAC) 20 MG/ML IV SOLN
INTRAVENOUS | Status: DC | PRN
  Administered 2014-09-01: 50 mg via INTRAVENOUS

## 2014-09-01 MED ORDER — NEOSTIGMINE METHYLSULFATE 10 MG/10ML IV SOLN
INTRAVENOUS | Status: DC | PRN
  Administered 2014-09-01: 3 mg via INTRAVENOUS

## 2014-09-01 MED ORDER — FERROUS FUMARATE 325 (106 FE) MG PO TABS
1.0000 | ORAL_TABLET | ORAL | Status: DC
  Administered 2014-09-01 – 2014-09-05 (×3): 106 mg via ORAL
  Filled 2014-09-01 (×3): qty 1

## 2014-09-01 MED ORDER — DELFLEX-LC/1.5% DEXTROSE 346 MOSM/L IP SOLN: Freq: Once | INTRAPERITONEAL | Status: DC

## 2014-09-01 MED ORDER — LEVOTHYROXINE SODIUM 50 MCG PO TABS
50.0000 ug | ORAL_TABLET | Freq: Every day | ORAL | Status: DC
  Administered 2014-09-02 – 2014-09-05 (×4): 50 ug via ORAL
  Filled 2014-09-01 (×5): qty 1

## 2014-09-01 MED ORDER — LIDOCAINE HCL (CARDIAC) 20 MG/ML IV SOLN
INTRAVENOUS | Status: AC
  Filled 2014-09-01: qty 10

## 2014-09-01 MED ORDER — DEXTROSE-NACL 5-0.9 % IV SOLN
INTRAVENOUS | Status: DC
  Administered 2014-09-01: 17:00:00 via INTRAVENOUS

## 2014-09-01 MED ORDER — SODIUM CHLORIDE 0.9 % IV SOLN
INTRAVENOUS | Status: DC
  Administered 2014-09-01: 10:00:00 via INTRAVENOUS

## 2014-09-01 MED ORDER — CETYLPYRIDINIUM CHLORIDE 0.05 % MT LIQD
7.0000 mL | Freq: Two times a day (BID) | OROMUCOSAL | Status: DC
  Administered 2014-09-01 – 2014-09-04 (×6): 7 mL via OROMUCOSAL

## 2014-09-01 MED ORDER — ONDANSETRON HCL 4 MG/2ML IJ SOLN
INTRAMUSCULAR | Status: AC
  Filled 2014-09-01: qty 2

## 2014-09-01 MED ORDER — ACETAMINOPHEN 325 MG PO TABS: 325.0000 mg | ORAL_TABLET | ORAL | Status: DC | PRN

## 2014-09-01 MED ORDER — DELFLEX-LM/1.5% DEXTROSE 346 MOSM/L IP SOLN: INTRAPERITONEAL | Status: DC

## 2014-09-01 MED ORDER — GLYCOPYRROLATE 0.2 MG/ML IJ SOLN
INTRAMUSCULAR | Status: DC | PRN
  Administered 2014-09-01: 0.4 mg via INTRAVENOUS

## 2014-09-01 MED ORDER — GENTAMICIN SULFATE 0.1 % EX CREA
1.0000 "application " | TOPICAL_CREAM | Freq: Three times a day (TID) | CUTANEOUS | Status: DC
  Administered 2014-09-02 – 2014-09-05 (×4): 1 via TOPICAL
  Filled 2014-09-01: qty 15

## 2014-09-01 MED ORDER — SUFENTANIL CITRATE 50 MCG/ML IV SOLN
INTRAVENOUS | Status: DC | PRN
  Administered 2014-09-01: 5 ug via INTRAVENOUS
  Administered 2014-09-01: 20 ug via INTRAVENOUS
  Administered 2014-09-01: 5 ug via INTRAVENOUS

## 2014-09-01 MED ORDER — POTASSIUM CHLORIDE 10 MEQ/50ML IV SOLN: 10.0000 meq | Freq: Every day | INTRAVENOUS | Status: DC | PRN

## 2014-09-01 MED ORDER — DELFLEX-LM/1.5% DEXTROSE 346 MOSM/L IP SOLN: Freq: Once | INTRAPERITONEAL | Status: DC

## 2014-09-01 MED ORDER — ASPIRIN EC 81 MG PO TBEC
81.0000 mg | DELAYED_RELEASE_TABLET | Freq: Every day | ORAL | Status: DC
  Administered 2014-09-01 – 2014-09-05 (×5): 81 mg via ORAL
  Filled 2014-09-01 (×5): qty 1

## 2014-09-01 MED ORDER — SODIUM CHLORIDE 0.9 % IJ SOLN: 9.0000 mL | INTRAMUSCULAR | Status: DC | PRN

## 2014-09-01 MED ORDER — BUPIVACAINE HCL (PF) 0.25 % IJ SOLN
INTRAMUSCULAR | Status: DC | PRN
  Administered 2014-09-01: 5 mL

## 2014-09-01 MED ORDER — FENTANYL 10 MCG/ML IV SOLN
INTRAVENOUS | Status: DC
  Administered 2014-09-01: 16:00:00 via INTRAVENOUS
  Administered 2014-09-01: 60 ug via INTRAVENOUS
  Administered 2014-09-02: 10 ug via INTRAVENOUS
  Administered 2014-09-02: 0 ug via INTRAVENOUS
  Administered 2014-09-02: 10 ug via INTRAVENOUS
  Administered 2014-09-02: 20 ug/h via INTRAVENOUS
  Filled 2014-09-01 (×2): qty 50

## 2014-09-01 MED ORDER — CALCITRIOL 0.5 MCG PO CAPS
0.5000 ug | ORAL_CAPSULE | ORAL | Status: DC
  Filled 2014-09-01: qty 1

## 2014-09-01 MED ORDER — BUPIVACAINE 0.25 % ON-Q PUMP SINGLE CATH 400 ML
400.0000 mL | INJECTION | Status: DC
  Administered 2014-09-01: 400 mL
  Filled 2014-09-01: qty 400

## 2014-09-01 MED ORDER — DEXTROSE 5 % IV SOLN
1.5000 g | INTRAVENOUS | Status: DC | PRN
  Administered 2014-09-01: 1.5 g via INTRAVENOUS

## 2014-09-01 MED ORDER — PHENYLEPHRINE HCL 10 MG/ML IJ SOLN
INTRAMUSCULAR | Status: DC | PRN
  Administered 2014-09-01 (×3): 80 ug via INTRAVENOUS

## 2014-09-01 MED ORDER — ONDANSETRON HCL 4 MG/2ML IJ SOLN
INTRAMUSCULAR | Status: DC | PRN
  Administered 2014-09-01: 4 mg via INTRAVENOUS

## 2014-09-01 MED ORDER — PHENYLEPHRINE HCL 10 MG/ML IJ SOLN
INTRAMUSCULAR | Status: AC
  Filled 2014-09-01: qty 1

## 2014-09-01 MED ORDER — DELFLEX-LC/1.5% DEXTROSE 346 MOSM/L IP SOLN
INTRAPERITONEAL | Status: DC
  Administered 2014-09-01: 10000 mL via INTRAPERITONEAL

## 2014-09-01 MED ORDER — PHENYLEPHRINE HCL 10 MG/ML IJ SOLN
10.0000 mg | INTRAVENOUS | Status: DC | PRN
  Administered 2014-09-01: 40 ug/min via INTRAVENOUS

## 2014-09-01 MED ORDER — SUFENTANIL CITRATE 50 MCG/ML IV SOLN
INTRAVENOUS | Status: AC
  Filled 2014-09-01: qty 1

## 2014-09-01 MED ORDER — BUPIVACAINE HCL (PF) 0.25 % IJ SOLN
INTRAMUSCULAR | Status: AC
  Filled 2014-09-01: qty 30

## 2014-09-01 MED ORDER — ACETAMINOPHEN 160 MG/5ML PO SOLN
1000.0000 mg | Freq: Four times a day (QID) | ORAL | Status: DC
  Filled 2014-09-01: qty 40

## 2014-09-01 MED ORDER — LIDOCAINE HCL 4 % MT SOLN
OROMUCOSAL | Status: DC | PRN
  Administered 2014-09-01: 2.5 mL via TOPICAL

## 2014-09-01 MED ORDER — LANTHANUM CARBONATE 500 MG PO CHEW
1000.0000 mg | CHEWABLE_TABLET | Freq: Three times a day (TID) | ORAL | Status: DC
  Administered 2014-09-02 – 2014-09-05 (×9): 1000 mg via ORAL
  Filled 2014-09-01 (×14): qty 2

## 2014-09-01 MED ORDER — VITAMIN D3 25 MCG (1000 UNIT) PO TABS
3000.0000 [IU] | ORAL_TABLET | Freq: Every day | ORAL | Status: DC
  Administered 2014-09-02 – 2014-09-05 (×4): 3000 [IU] via ORAL
  Filled 2014-09-01 (×4): qty 3

## 2014-09-01 MED ORDER — METHOXY PEG-EPOETIN BETA 75 MCG/0.3ML IJ SOSY: 150.0000 ug | PREFILLED_SYRINGE | INTRAMUSCULAR | Status: DC

## 2014-09-01 MED ORDER — ROCURONIUM BROMIDE 100 MG/10ML IV SOLN
INTRAVENOUS | Status: DC | PRN
  Administered 2014-09-01 (×2): 10 mg via INTRAVENOUS
  Administered 2014-09-01: 30 mg via INTRAVENOUS

## 2014-09-01 MED ORDER — CEFUROXIME SODIUM 1.5 G IJ SOLR
INTRAMUSCULAR | Status: AC
  Filled 2014-09-01: qty 1.5

## 2014-09-01 MED ORDER — ONDANSETRON HCL 4 MG/2ML IJ SOLN: 4.0000 mg | Freq: Once | INTRAMUSCULAR | Status: DC | PRN

## 2014-09-01 MED ORDER — ACETAMINOPHEN 500 MG PO TABS
1000.0000 mg | ORAL_TABLET | Freq: Four times a day (QID) | ORAL | Status: DC
  Administered 2014-09-01 – 2014-09-04 (×8): 1000 mg via ORAL
  Filled 2014-09-01 (×17): qty 2

## 2014-09-01 MED ORDER — HYDROMORPHONE HCL 1 MG/ML IJ SOLN
0.2500 mg | INTRAMUSCULAR | Status: DC | PRN
  Administered 2014-09-01: 0.25 mg via INTRAVENOUS

## 2014-09-01 MED ORDER — SODIUM CHLORIDE 0.9 % IJ SOLN
INTRAMUSCULAR | Status: AC
  Filled 2014-09-01: qty 10

## 2014-09-01 MED ORDER — NEOSTIGMINE METHYLSULFATE 10 MG/10ML IV SOLN
INTRAVENOUS | Status: AC
  Filled 2014-09-01: qty 1

## 2014-09-01 MED ORDER — SENNOSIDES-DOCUSATE SODIUM 8.6-50 MG PO TABS
1.0000 | ORAL_TABLET | Freq: Every day | ORAL | Status: DC
  Filled 2014-09-01 (×5): qty 1

## 2014-09-01 MED ORDER — MIDAZOLAM HCL 2 MG/2ML IJ SOLN
INTRAMUSCULAR | Status: AC
  Filled 2014-09-01: qty 2

## 2014-09-01 MED ORDER — GLYCOPYRROLATE 0.2 MG/ML IJ SOLN
INTRAMUSCULAR | Status: AC
  Filled 2014-09-01: qty 2

## 2014-09-01 MED ORDER — MIDAZOLAM HCL 5 MG/5ML IJ SOLN
INTRAMUSCULAR | Status: DC | PRN
  Administered 2014-09-01: 1 mg via INTRAVENOUS

## 2014-09-01 MED ORDER — HYDROMORPHONE HCL 1 MG/ML IJ SOLN: 0.2500 mg | INTRAMUSCULAR | Status: DC | PRN

## 2014-09-01 MED ORDER — MIDODRINE HCL 5 MG PO TABS
10.0000 mg | ORAL_TABLET | Freq: Two times a day (BID) | ORAL | Status: DC
  Administered 2014-09-01 – 2014-09-05 (×8): 10 mg via ORAL
  Filled 2014-09-01 (×10): qty 2

## 2014-09-01 MED ORDER — OXYCODONE HCL 5 MG PO TABS: 5.0000 mg | ORAL_TABLET | Freq: Once | ORAL | Status: DC | PRN

## 2014-09-01 MED ORDER — NALOXONE HCL 0.4 MG/ML IJ SOLN: 0.4000 mg | INTRAMUSCULAR | Status: DC | PRN

## 2014-09-01 MED ORDER — ACETAMINOPHEN 160 MG/5ML PO SOLN
325.0000 mg | ORAL | Status: DC | PRN
  Filled 2014-09-01: qty 20.3

## 2014-09-01 MED ORDER — SODIUM CHLORIDE 0.9 % IV SOLN
INTRAVENOUS | Status: DC | PRN
  Administered 2014-09-01: 11:00:00 via INTRAVENOUS

## 2014-09-01 MED ORDER — HYDROMORPHONE HCL 1 MG/ML IJ SOLN
INTRAMUSCULAR | Status: AC
  Filled 2014-09-01: qty 1

## 2014-09-01 SURGICAL SUPPLY — 88 items
ADH SKN CLS APL DERMABOND .7 (GAUZE/BANDAGES/DRESSINGS)
APL SKNCLS STERI-STRIP NONHPOA (GAUZE/BANDAGES/DRESSINGS) ×1
BAG SPEC RTRVL LRG 6X4 10 (ENDOMECHANICALS) ×1
BENZOIN TINCTURE PRP APPL 2/3 (GAUZE/BANDAGES/DRESSINGS) ×2 IMPLANT
CANISTER SUCTION 2500CC (MISCELLANEOUS) ×3 IMPLANT
CATH KIT ON Q 5IN SLV (PAIN MANAGEMENT) ×1 IMPLANT
CATH THORACIC 28FR (CATHETERS) ×1 IMPLANT
CATH THORACIC 36FR (CATHETERS) IMPLANT
CATH THORACIC 36FR RT ANG (CATHETERS) IMPLANT
CLIP TI MEDIUM 6 (CLIP) ×2 IMPLANT
CONN ST 1/4X3/8  BEN (MISCELLANEOUS)
CONN ST 1/4X3/8 BEN (MISCELLANEOUS) IMPLANT
CONN Y 3/8X3/8X3/8  BEN (MISCELLANEOUS)
CONN Y 3/8X3/8X3/8 BEN (MISCELLANEOUS) IMPLANT
CONT SPEC 4OZ CLIKSEAL STRL BL (MISCELLANEOUS) ×4 IMPLANT
COVER SURGICAL LIGHT HANDLE (MISCELLANEOUS) ×2 IMPLANT
DERMABOND ADVANCED (GAUZE/BANDAGES/DRESSINGS)
DERMABOND ADVANCED .7 DNX12 (GAUZE/BANDAGES/DRESSINGS) IMPLANT
DRAIN CHANNEL 28F RND 3/8 FF (WOUND CARE) IMPLANT
DRAIN CHANNEL 32F RND 10.7 FF (WOUND CARE) IMPLANT
DRAPE LAPAROSCOPIC ABDOMINAL (DRAPES) ×2 IMPLANT
DRAPE WARM FLUID 44X44 (DRAPE) ×2 IMPLANT
ELECT BLADE 6.5 EXT (BLADE) ×2 IMPLANT
ELECT REM PT RETURN 9FT ADLT (ELECTROSURGICAL) ×2
ELECTRODE REM PT RTRN 9FT ADLT (ELECTROSURGICAL) ×1 IMPLANT
GAUZE SPONGE 4X4 12PLY STRL (GAUZE/BANDAGES/DRESSINGS) ×2 IMPLANT
GLOVE SURG SIGNA 7.5 PF LTX (GLOVE) ×4 IMPLANT
GOWN STRL REUS W/ TWL LRG LVL3 (GOWN DISPOSABLE) ×2 IMPLANT
GOWN STRL REUS W/ TWL XL LVL3 (GOWN DISPOSABLE) ×2 IMPLANT
GOWN STRL REUS W/TWL LRG LVL3 (GOWN DISPOSABLE) ×4
GOWN STRL REUS W/TWL XL LVL3 (GOWN DISPOSABLE) ×4
HANDLE STAPLE ENDO GIA SHORT (STAPLE) ×1
HEMOSTAT SURGICEL 2X14 (HEMOSTASIS) IMPLANT
KIT BASIN OR (CUSTOM PROCEDURE TRAY) ×2 IMPLANT
KIT ROOM TURNOVER OR (KITS) ×2 IMPLANT
KIT SUCTION CATH 14FR (SUCTIONS) ×2 IMPLANT
LIQUID BAND (GAUZE/BANDAGES/DRESSINGS) ×1 IMPLANT
NS IRRIG 1000ML POUR BTL (IV SOLUTION) ×8 IMPLANT
PACK CHEST (CUSTOM PROCEDURE TRAY) ×2 IMPLANT
PAD ARMBOARD 7.5X6 YLW CONV (MISCELLANEOUS) ×4 IMPLANT
POUCH ENDO CATCH II 15MM (MISCELLANEOUS) IMPLANT
POUCH SPECIMEN RETRIEVAL 10MM (ENDOMECHANICALS) ×1 IMPLANT
RELOAD EGIA 60 MED/THCK PURPLE (STAPLE) ×4 IMPLANT
RELOAD STAPLE 60 BLK XTHK ART (STAPLE) IMPLANT
RELOAD STAPLE 60 MED/THCK ART (STAPLE) IMPLANT
RELOAD TRI 2.0 60 XTHK VAS SUL (STAPLE) ×2 IMPLANT
SCISSORS ENDO CVD 5DCS (MISCELLANEOUS) IMPLANT
SEALANT PROGEL (MISCELLANEOUS) IMPLANT
SEALANT SURG COSEAL 4ML (VASCULAR PRODUCTS) IMPLANT
SEALANT SURG COSEAL 8ML (VASCULAR PRODUCTS) IMPLANT
SOLUTION ANTI FOG 6CC (MISCELLANEOUS) ×2 IMPLANT
SPECIMEN JAR MEDIUM (MISCELLANEOUS) ×2 IMPLANT
SPONGE GAUZE 4X4 12PLY STER LF (GAUZE/BANDAGES/DRESSINGS) ×1 IMPLANT
STAPLER ENDO GIA 12 SHRT THIN (STAPLE) IMPLANT
STAPLER ENDO GIA 12MM SHORT (STAPLE) ×1 IMPLANT
SUT PROLENE 4 0 RB 1 (SUTURE)
SUT PROLENE 4-0 RB1 .5 CRCL 36 (SUTURE) IMPLANT
SUT SILK  1 MH (SUTURE) ×2
SUT SILK 1 MH (SUTURE) ×1 IMPLANT
SUT SILK 1 TIES 10X30 (SUTURE) IMPLANT
SUT SILK 2 0 SH (SUTURE) IMPLANT
SUT SILK 2 0SH CR/8 30 (SUTURE) IMPLANT
SUT SILK 3 0 SH 30 (SUTURE) ×2 IMPLANT
SUT SILK 3 0SH CR/8 30 (SUTURE) IMPLANT
SUT VIC AB 0 CTX 27 (SUTURE) IMPLANT
SUT VIC AB 1 CTX 27 (SUTURE) ×1 IMPLANT
SUT VIC AB 2-0 CT1 27 (SUTURE)
SUT VIC AB 2-0 CT1 TAPERPNT 27 (SUTURE) IMPLANT
SUT VIC AB 2-0 CTX 36 (SUTURE) ×1 IMPLANT
SUT VIC AB 3-0 MH 27 (SUTURE) IMPLANT
SUT VIC AB 3-0 SH 27 (SUTURE)
SUT VIC AB 3-0 SH 27X BRD (SUTURE) IMPLANT
SUT VIC AB 3-0 X1 27 (SUTURE) ×3 IMPLANT
SUT VICRYL 0 UR6 27IN ABS (SUTURE) ×4 IMPLANT
SUT VICRYL 2 TP 1 (SUTURE) IMPLANT
SWAB COLLECTION DEVICE MRSA (MISCELLANEOUS) IMPLANT
SYSTEM SAHARA CHEST DRAIN ATS (WOUND CARE) ×2 IMPLANT
TAPE CLOTH SURG 4X10 WHT LF (GAUZE/BANDAGES/DRESSINGS) ×1 IMPLANT
TIP APPLICATOR SPRAY EXTEND 16 (VASCULAR PRODUCTS) IMPLANT
TOWEL OR 17X24 6PK STRL BLUE (TOWEL DISPOSABLE) ×2 IMPLANT
TOWEL OR 17X26 10 PK STRL BLUE (TOWEL DISPOSABLE) ×4 IMPLANT
TRAP SPECIMEN MUCOUS 40CC (MISCELLANEOUS) IMPLANT
TRAY FOLEY CATH 16FRSI W/METER (SET/KITS/TRAYS/PACK) ×2 IMPLANT
TROCAR XCEL BLADELESS 5X75MML (TROCAR) ×2 IMPLANT
TROCAR XCEL NON-BLD 5MMX100MML (ENDOMECHANICALS) IMPLANT
TUBE ANAEROBIC SPECIMEN COL (MISCELLANEOUS) IMPLANT
TUNNELER SHEATH ON-Q 11GX8 DSP (PAIN MANAGEMENT) IMPLANT
WATER STERILE IRR 1000ML POUR (IV SOLUTION) ×3 IMPLANT

## 2014-09-01 NOTE — Transfer of Care (Signed)
Immediate Anesthesia Transfer of Care Note  Patient: Kimberly [name]  Procedure(s) Performed: Procedure(s): VIDEO ASSISTED THORACOSCOPY (VATS)/WEDGE RESECTION (Left)  Patient Location: PACU  Anesthesia Type:General  Level of Consciousness: awake, alert  and oriented  Airway & Oxygen Therapy: Patient Spontanous Breathing and Patient connected to nasal cannula oxygen  Post-op Assessment: Report given to PACU RN, Post -op Vital signs reviewed and stable and Patient moving all extremities  Post vital signs: Reviewed and stable  Complications: No apparent anesthesia complications

## 2014-09-01 NOTE — Consult Note (Signed)
[facility] Renal Consultation Note  Indication for Consultation:  Management of ESRD/hemodialysis; anemia, hypertension/volume and secondary hyperparathyroidism  HPI: Kimberly [name] is a 58 y.o. female with a history of hypotension, hypothyroidism, and ESRD, secondary to IgA nephropathy, s/p renal transplant at [facility] 01/2010, failed in 12/2010, now on CCPD, who was evaluated for chronic dry cough of 15 years, low-grade fevers for several months, and increasing dyspnea on exertion and was found to have bilateral pulmonary infiltrates (L > R) with a 4-cm nodular infiltrate in the left apex per CT 10/8.   Pathology and cultures per bronchoscopy 10/13 by Dr. Melvyn Novas were undiagnostic, but she was referred to [facility] and was scheduled for left VATS (video-assisted thoracoscopy) and left-upper-lobe wedge resection today per Dr. Roxan Hockey.  She was seen in PACU post-surgery and was stable, but complained of mild left arm pain.  Dialysis Orders:   [facility]   7x/wk   44.5 kg    5 exchanges with fill volume 2000 cc, dwell time 1:30, last fill volume 2000 cc, i day exchange with fill volume 2000 cc  Past Medical History  Diagnosis Date  . Kidney transplanted 01/2010    [facility], has had some rejection  . Hypothyroidism   . Anemia   . GERD (gastroesophageal reflux disease)   . Immunosuppression   . First degree AV block   . Premature atrial beat   . Premature ventricular beat   . Shingles 03/2011  . Hypertension     history; resolved on dialysis  . History of chronic cough   . History of hoarseness   . Heart murmur     child  . Stroke October 2010, March 2013    cerebral aneurysm  . Diabetes mellitus     medication induced with  steroids  . IgA nephropathy     on peritoneal dialysis  . Renal transplant failure and rejection 2012  . History of hiatal hernia    Past Surgical History  Procedure Laterality Date  . Kidney transplant Right 01/2010    [facility]  . Ureter revision  04/2011    [facility]  .  Umbilical hernia repair  10/25/2009    open with mesh,  Dr [name]  . Capd insertion  08/25/2008    open, Dr [name]  . Capd removal  03/22/2010    Dr [name]  . Capd insertion N/A 12/14/2013    Procedure: LAPAROSCOPIC INSERTION CONTINUOUS AMBULATORY PERITONEAL DIALYSIS  (CAPD) CATHETER;  Surgeon: [name], MD;  Location: [place];  Service: General;  Laterality: N/A;  . Insertion of mesh N/A 12/14/2013    Procedure: INSERTION OF MESH;  Surgeon: [name], MD;  Location: [place];  Service: General;  Laterality: N/A;  . Laparoscopic lysis of adhesions N/A 12/14/2013    Procedure: LAPAROSCOPIC LYSIS OF ADHESIONS;  Surgeon: [name], MD;  Location: [place];  Service: General;  Laterality: N/A;  . Ventral hernia repair N/A 12/14/2013    Procedure: LAPAROSCOPIC VENTRAL HERNIA;  Surgeon: [name], MD;  Location: [place];  Service: General;  Laterality: N/A;  . Video bronchoscopy Bilateral 07/05/2014    Procedure: VIDEO BRONCHOSCOPY WITH FLUORO;  Surgeon: [name], MD;  Location: [facility];  Service: Cardiopulmonary;  Laterality: Bilateral;  . Eye surgery      rdial keratotomy   Family History  Problem Relation Age of Onset  . Coronary artery disease Mother   . Emphysema Mother     smoked  . Stroke Maternal  Uncle   . Cancer Mother     ? ovarian CA   Social History She denies any history of tobacco, alcohol, or illicit drug use.  She previously worked as a Chief Strategy Officer.  Allergies  Allergen Reactions  . Dapsone Rash    Pain in feet  . Pregabalin Rash and Other (See Comments)    fever  . Sulfonamide Derivatives Rash  . Tramadol Rash    Rash on cheek   Prior to Admission medications   Medication Sig Start Date End Date Taking? Authorizing Provider  aspirin 81 MG tablet Take 81 mg by mouth daily.   Yes Historical Provider, MD  calcitRIOL (ROCALTROL) 0.5 MCG capsule Take 0.5 mcg by mouth once a week. Monday   Yes Historical Provider, MD  Cholecalciferol  (VITAMIN D3) 3000 UNITS TABS Take 3,000 Units by mouth daily.   Yes Historical Provider, MD  diazepam (VALIUM) 5 MG tablet Take 5 mg by mouth every 6 (six) hours as needed for anxiety.   Yes Historical Provider, MD  ferrous fumarate (HEMOCYTE - 106 MG FE) 325 (106 FE) MG TABS tablet Take 1 tablet by mouth every other day.   Yes Historical Provider, MD  gentamicin cream (GARAMYCIN) 0.1 % Apply 1 application topically 3 (three) times daily.  12/21/13  Yes Historical Provider, MD  guaiFENesin-codeine 100-10 MG/5ML syrup Take 5 mLs by mouth every 4 (four) hours as needed for cough. 07/27/14  Yes Carlyle Basques, MD  lanthanum (FOSRENOL) 1000 MG chewable tablet Chew 1,000 mg by mouth 3 (three) times daily with meals.   Yes Historical Provider, MD  levothyroxine (SYNTHROID, LEVOTHROID) 50 MCG tablet Take 50 mcg by mouth daily before breakfast.   Yes Historical Provider, MD  Methoxy PEG-Epoetin Beta (MIRCERA) 75 MCG/0.3ML SOSY Inject 150 mcg as directed every 14 (fourteen) days. Gets 75 mcg in each arm .   Yes Historical Provider, MD  midodrine (PROAMATINE) 10 MG tablet Take 10 mg by mouth 2 (two) times daily.   Yes Historical Provider, MD  OVER THE COUNTER MEDICATION Take 2 capsules by mouth 3 (three) times daily. Juice plus capsules   Yes Historical Provider, MD  potassium chloride SA (K-DUR,KLOR-CON) 20 MEQ tablet Take 1 tablet (20 mEq total) by mouth daily. 08/25/14  Yes [name], MD   Labs:  Results for orders placed or performed during the hospital encounter of 09/01/14 (from the past 48 hour(s))  I-STAT 4, (NA,K, GLUC, HGB,HCT)     Status: Abnormal   Collection Time: 09/01/14  9:26 AM  Result Value Ref Range   Sodium 135 (L) 137 - 147 mEq/L   Potassium 3.8 3.7 - 5.3 mEq/L   Glucose, Bld 93 70 - 99 mg/dL   HCT 26.0 (L) 36.0 - 46.0 %   Hemoglobin 8.8 (L) 12.0 - 15.0 g/dL  Glucose, capillary     Status: None   Collection Time: 09/01/14 11:36 AM  Result Value Ref Range    Glucose-Capillary 84 70 - 99 mg/dL  Glucose, capillary     Status: None   Collection Time: 09/01/14  2:45 PM  Result Value Ref Range   Glucose-Capillary 81 70 - 99 mg/dL   Comment 1 Notify RN    Constitutional: negative for chills, fatigue and sweats Ears, nose, mouth, throat, and face: negative for earaches, hoarseness, nasal congestion and sore throat Respiratory: positive for dyspnea on exertion, negative for hemoptysis and sputum Cardiovascular: negative for chest pain, chest pressure/discomfort, orthopnea and palpitations Gastrointestinal: negative for abdominal  pain, change in bowel habits, nausea and vomiting Genitourinary:negative, anuric Musculoskeletal:negative for arthralgias, back pain, myalgias and neck pain Neurological: negative for dizziness, gait problems, headaches, paresthesia and speech problems  Physical Exam: Filed Vitals:   09/01/14 1530  BP: 97/66  Pulse:   Temp:   Resp: 14     General appearance: alert, cooperative and no distress Head: Normocephalic, without obvious abnormality, atraumatic Neck: no adenopathy, no carotid bruit, no JVD and supple, symmetrical, trachea midline Resp: decreased breath sounds, limited effort, but clear Cardio: RRR with Gr II/VI systolic murmur, no rub GI: soft, non-tender; bowel sounds normal; no masses,  no organomegaly Extremities: extremities normal, atraumatic, no cyanosis or edema Neurologic: Grossly normal Dialysis Access: PD catheter @ LLQ, exit site dry   Assessment/Plan: 1. Cough, low-grade fever, DOE - sec to pulmonary infiltrates (L > R), 4-cm nodular infiltrate @ LUL; s/p VATS with LUL wedge resection today per Dr. Roxan Hockey, stable post-surgery. 2. ESRD - CCPD , followed by Dr. Moshe Cipro. 3. Hypotension/volume - BP 103/52, on Midodrine 10 mg bid; wt 49.9 kg, no excess fluid. 4. Anemia - Hgb 8.8 pre-surgery. 5. Metabolic bone disease - Last corrected Ca 10, P 3.7, iPTH 204; Calcitriol 0.5 mcg qwk,  Cholecalciferol 1000 U qd, Fosrenol 1 g with meals. 6. Nutrition - Last Alb 2.1, renal diet, vitamin. 7. ? Esophagitis - per CT 10/8, sliding hiatal hernia with esophageal wall thickening.  Dailon Sheeran 09/01/2014, 4:12 PM   Attending Nephrologist: Elmarie Shiley, MD

## 2014-09-01 NOTE — Anesthesia Procedure Notes (Signed)
Procedure Name: Intubation Date/Time: 09/01/2014 12:43 PM Performed by: Melina Copa, Bluma Buresh R Pre-anesthesia Checklist: Patient identified, Emergency Drugs available, Suction available, Patient being monitored and Timeout performed Patient Re-evaluated:Patient Re-evaluated prior to inductionOxygen Delivery Method: Circle system utilized Preoxygenation: Pre-oxygenation with 100% oxygen Intubation Type: IV induction Ventilation: Mask ventilation without difficulty Laryngoscope Size: Mac and 3 Grade View: Grade I Endobronchial tube: Left and Double lumen EBT and 35 Fr Number of attempts: 1 Airway Equipment and Method: Stylet and Fiberoptic brochoscope Placement Confirmation: ETT inserted through vocal cords under direct vision,  positive ETCO2 and breath sounds checked- equal and bilateral Secured at: 29 cm Tube secured with: Tape Dental Injury: Teeth and Oropharynx as per pre-operative assessment

## 2014-09-01 NOTE — H&P (View-Only) (Signed)
PCP is Reginia Naas, MD Referring Provider is Samuel Jester, MD  Chief Complaint  Patient presents with  . Lung Lesion    Surgical eval,Chest CT 06/30/2014    HPI: Kimberly [name] is a 58 year old woman with a history of end-stage renal disease on peritoneal dialysis who presents with a chief complaint of low-grade fevers.  Kimberly [name] 58 year old woman with a history of end-stage renal disease. She had a renal transplant back in 2011. That failed about a year later and she has been on peritoneal dialysis since then. She also has a past history of diabetes, hypertension, hypothyroidism, and a heart murmur. She has a brain aneurysm and has had 2 strokes previously. She is a lifelong nonsmoker.  She says that over the past several months she's been having low-grade fevers. This would go up to around 100.6. She had them just about every day. She has a chronic cough that she's had for about 15 years. This is a dry cough. She also has occasional wheezing. As part of her fever workup a chest x-ray was done. That led to a CT of the chest which showed bilateral pulmonary infiltrates left greater than right. There was a 4.1 cm infiltrate in the left apex. She was referred to Dr. Melvyn Novas who did a bronchoscopy. Pathology and cultures were unrevealing.  In addition to the above-noted symptoms she complains of getting short of breath with exertion. She says that she can walk about 10 minutes on level ground. She has chest pain which is usually associated with coughing. She does not have any classic anginal pressure or tightness with exertion. She has had dizzy spells. She also complains of bruising easily. She says that she has had difficulty with low blood pressure especially recently since she was started on a fifth PD exchange daily.   Past Medical History  Diagnosis Date  . Kidney transplanted 01/2010    [facility], has had some rejection  . IgA nephropathy     perviously on peritoneal dialysis  .  Hypothyroidism   . Anemia   . GERD (gastroesophageal reflux disease)   . Immunosuppression   . First degree AV block   . Premature atrial beat   . Premature ventricular beat   . Shingles 03/2011  . Hypertension     history; resolved on dialysis  . Stroke October 2010, March 2013    cerebral aneurysm  . Diabetes mellitus     medication induced with  steroids  . History of chronic cough   . History of hoarseness   . Renal transplant failure and rejection 2012    Past Surgical History  Procedure Laterality Date  . Kidney transplant  01/2010    [facility]  . Ureter revision  04/2011    [facility]  . Umbilical hernia repair  10/25/2009    open with mesh,  Dr [name]  . Capd insertion  08/25/2008    open, Dr [name]  . Capd removal  03/22/2010    Dr [name]  . Eye surgery    . Capd insertion N/A 12/14/2013    Procedure: LAPAROSCOPIC INSERTION CONTINUOUS AMBULATORY PERITONEAL DIALYSIS  (CAPD) CATHETER;  Surgeon: [name], MD;  Location: [place];  Service: General;  Laterality: N/A;  . Insertion of mesh N/A 12/14/2013    Procedure: INSERTION OF MESH;  Surgeon: [name], MD;  Location: [place];  Service: General;  Laterality: N/A;  . Laparoscopic lysis of adhesions N/A 12/14/2013    Procedure: LAPAROSCOPIC LYSIS OF ADHESIONS;  Surgeon: [name], MD;  Location: [place];  Service: General;  Laterality: N/A;  . Ventral hernia repair N/A 12/14/2013    Procedure: LAPAROSCOPIC VENTRAL HERNIA;  Surgeon: [name], MD;  Location: [place];  Service: General;  Laterality: N/A;  . Video bronchoscopy Bilateral 07/05/2014    Procedure: VIDEO BRONCHOSCOPY WITH FLUORO;  Surgeon: [name], MD;  Location: [facility];  Service: Cardiopulmonary;  Laterality: Bilateral;    Family History  Problem Relation Age of Onset  . Coronary artery disease Mother   . Emphysema Mother     smoked  . Stroke Maternal Uncle   . Cancer Mother     ? ovarian CA    Social History History  Substance  Use Topics  . Smoking status: Never Smoker   . Smokeless tobacco: Never Used  . Alcohol Use: No    Current Outpatient Prescriptions  Medication Sig Dispense Refill  . aspirin 81 MG tablet Take 81 mg by mouth daily.    . calcitRIOL (ROCALTROL) 0.5 MCG capsule Take 0.5 mcg by mouth See admin instructions. Monday / Wednesday and Friday 0.80mcg twice daily.    . diazepam (VALIUM) 5 MG tablet Take 5 mg by mouth every 6 (six) hours as needed for anxiety.    Marland Kitchen gentamicin cream (GARAMYCIN) 0.1 %     . guaiFENesin-codeine 100-10 MG/5ML syrup Take 5 mLs by mouth every 4 (four) hours as needed for cough. 120 mL 0  . levothyroxine (SYNTHROID, LEVOTHROID) 50 MCG tablet Take 50 mcg by mouth daily before breakfast.    . OVER THE COUNTER MEDICATION Take 2 capsules by mouth 3 (three) times daily. Juice plus capsules     No current facility-administered medications for this visit.    Allergies  Allergen Reactions  . Dapsone Rash    Pain in feet  . Pregabalin Rash and Other (See Comments)    fever  . Sulfonamide Derivatives Rash  . Tramadol Rash    Rash on cheek    Review of Systems  Constitutional: Positive for fever, activity change, appetite change, fatigue and unexpected weight change. Negative for chills and diaphoresis.  HENT: Positive for congestion.   Respiratory: Positive for cough, shortness of breath and wheezing.   Cardiovascular: Positive for chest pain.  Gastrointestinal:       Hiatal hernia. Nausea, vomiting usually associated with coughing  Skin:       itching  Neurological:       2 previous strokes  Hematological: Bruises/bleeds easily.  All other systems reviewed and are negative.   BP 85/59 mmHg  Pulse 76  Resp 20  Ht 5\' 6"  (1.676 m)  Wt 113 lb (51.256 kg)  BMI 18.25 kg/m2  SpO2 95% Physical Exam  Constitutional: She is oriented to person, place, and time. She appears well-developed and well-nourished. No distress.  HENT:  Head: Normocephalic and atraumatic.   Eyes: EOM are normal. Pupils are equal, round, and reactive to light.  Neck: Neck supple. No thyromegaly present.  Cardiovascular: Normal rate and regular rhythm.   Murmur (2/6 systolic) heard. Pulmonary/Chest: Effort normal and breath sounds normal. She has no wheezes.  Abdominal: There is no tenderness.  Musculoskeletal: She exhibits no edema.  Lymphadenopathy:    She has no cervical adenopathy.  Neurological: She is alert and oriented to person, place, and time. No cranial nerve deficit.  Skin: Skin is warm and dry.  Vitals reviewed.    Diagnostic Tests: CT CHEST WITHOUT CONTRAST  TECHNIQUE: Multidetector  CT imaging of the chest was performed following the standard protocol without IV contrast.  COMPARISON: CT 06/20/2014  FINDINGS: Thoracic aortic is atherosclerotic. Coronary artery disease. Heart size normal. Tiny pericardial effusion.  Sliding hiatal hernia. Esophageal wall thickening is noted. Esophagitis cannot be excluded.  Large airways patent. Multiple ill-defined rounded pulmonary infiltrates are present. Most prominent conglomeration of infiltrates is present in the left pulmonary apex and measures approximately 4.1 cm in maximum diameter. Differential diagnosis includes infectious/ inflammatory infiltrates including atypical/opportunistic infection including granulomas disease.Differential diagnosis also includes metastatic disease. No pleural effusion. No pneumothorax.  Ascites noted. Adrenals unremarkable  Hydronephrosis with right renal atrophy. Left renal atrophy. Renal calcifications are noted  Chest wall is intact. No significant axillary or supraclavicular adenopathy noted. Degenerative changes lumbar spine . Chronic right .  IMPRESSION: 1. Multiple bilateral ill-defined nodular pulmonary infiltrates, the largest conglomerate is in the left upper lobe and measures 4.1 cm in maximum diameter. These could represent  infectious/inflammatory infiltrates including atypical/opportunistic infection including granulomatous disease. Differential diagnosis also includes metastatic disease. 2. Small pericardial effusion. Coronary artery disease. 3. Ascites. 4. Bilateral renal atrophy with chronic right hydronephrosis. 5. Sliding hiatal hernia. Esophageal wall thickening is noted. Esophageal pathology including esophagitis cannot be excluded.   Electronically Signed  By: Marcello Moores Register  On: 06/30/2014 10:27  Impression: 58 year old woman with end-stage renal disease who presents with a persistent low-grade fever and shortness of breath with exertion. Workup has revealed bilateral pulmonary infiltrates left greater than right. Bronchoscopy was nondiagnostic.  The best option at this point would be to go ahead with a left VATS and wedge resection for diagnostic purposes. I discussed the procedure with her in detail. We discussed the general nature of the operation, the need for general anesthesia, the incisions to be used, the expected hospital stay, and the overall recovery. We reviewed the indications, risks, benefits, and alternatives. She understands the risk include, but are not limited to death, MI, stroke, DVT, PE, bleeding, possible need for transfusion, infection, prolonged air leaks, cardiac arrhythmias, as well as the possibility of unforeseeable complications.  She is interested in proceeding with thoracoscopic lung biopsy, but is unsure as to when she wants to do it. 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 She thinks she may want to wait until after the first of year to have the procedure done. I asked her to let her office know when she would like to proceed and we will get it scheduled for her.  Plan: Left VATS, wedge resection. Date to be determined by patient preference

## 2014-09-01 NOTE — Brief Op Note (Addendum)
      [address]       [place] [address]             [phone]     09/01/2014  2:01 PM  PATIENT:  Kimberly [name]  58 y.o. female  PRE-OPERATIVE DIAGNOSIS:   BILATERAL LUNG INFILTRATES  POST-OPERATIVE DIAGNOSIS:  BILATERAL LUNG INFILTRATES  PROCEDURE:  Procedure(s): VIDEO ASSISTED THORACOSCOPY (VATS) WEDGE RESECTION LUL On-Q LOCAL ANESTHETIC CATHETER PLACEMENT  SURGEON:  Surgeon(s): [name], MD  PHYSICIAN ASSISTANT: WAYNE GOLD PA-C  ANESTHESIA:   general  SPECIMEN:  Biopsy / Limited Resection  DISPOSITION OF SPECIMEN:  Pathology  DRAINS: 1 Chest Tube(s) in the LEFT HEMITHORAX   PATIENT CONDITION:  PACU - hemodynamically stable.  PRE-OPERATIVE WEIGHT: 49kg  FROZEN: OSSIFIED CALCIFIC  MATRIX SIMILAR TO BONE  COMPLICATIONS: NO KNOWN  EBL: MINIMAL  FINDINGS: Calcified mass in left apex- frozen indeterminate- will require decalcification

## 2014-09-01 NOTE — Progress Notes (Addendum)
S/p Left VATS, wedge resection  Some incisional pain  BP 90/60 mmHg  Pulse 68  Temp(Src) 98 F (36.7 C) (Oral)  Resp 14  Ht 5\' 6"  (1.676 m)  Wt 110 lb 0.2 oz (49.901 kg)  BMI 17.76 kg/m2  SpO2 98%   Intake/Output Summary (Last 24 hours) at 09/01/14 1728 Last data filed at 09/01/14 1426  Gross per 24 hour  Intake   1000 ml  Output     50 ml  Net    950 ml    Minimal air leak and minimal drainage from CT  Doing well early postop  To start PD tonight

## 2014-09-01 NOTE — Interval H&P Note (Signed)
History and Physical Interval Note:  Surgery cancelled due to severe hypokalemia last week. Now K= 3.8. Will proceed with left VATS, wedge resection.  09/01/2014 11:44 AM  Kimberly [name]  has presented today for surgery, with the diagnosis of BILATERAL INFILTRATES  The various methods of treatment have been discussed with the patient and family. After consideration of risks, benefits and other options for treatment, the patient has consented to  Procedure(s): VIDEO ASSISTED THORACOSCOPY (VATS)/WEDGE RESECTION (Left) as a surgical intervention .  The patient's history has been reviewed, patient examined, no change in status, stable for surgery.  I have reviewed the patient's chart and labs.  Questions were answered to the patient's satisfaction.     [name]

## 2014-09-02 ENCOUNTER — Inpatient Hospital Stay (HOSPITAL_COMMUNITY): Payer: BC Managed Care – PPO

## 2014-09-02 ENCOUNTER — Encounter (HOSPITAL_COMMUNITY): Payer: Self-pay | Admitting: Thoracic Surgery (Cardiothoracic Vascular Surgery)

## 2014-09-02 LAB — BLOOD GAS, ARTERIAL
Acid-Base Excess: 3.5 mmol/L — ABNORMAL HIGH (ref 0.0–2.0)
BICARBONATE: 27.6 meq/L — AB (ref 20.0–24.0)
O2 CONTENT: 4 L/min
O2 Saturation: 98.5 %
PATIENT TEMPERATURE: 98.6
PO2 ART: 128 mmHg — AB (ref 80.0–100.0)
TCO2: 28.9 mmol/L (ref 0–100)
pCO2 arterial: 42.6 mmHg (ref 35.0–45.0)
pH, Arterial: 7.427 (ref 7.350–7.450)

## 2014-09-02 LAB — POCT I-STAT, CHEM 8
BUN: 33 mg/dL — ABNORMAL HIGH (ref 6–23)
Calcium, Ion: 1.12 mmol/L (ref 1.12–1.23)
Chloride: 99 mEq/L (ref 96–112)
Creatinine, Ser: 9.6 mg/dL — ABNORMAL HIGH (ref 0.50–1.10)
Glucose, Bld: 132 mg/dL — ABNORMAL HIGH (ref 70–99)
HEMATOCRIT: 20 % — AB (ref 36.0–46.0)
Hemoglobin: 6.8 g/dL — CL (ref 12.0–15.0)
POTASSIUM: 3.7 meq/L (ref 3.7–5.3)
SODIUM: 136 meq/L — AB (ref 137–147)
TCO2: 22 mmol/L (ref 0–100)

## 2014-09-02 LAB — CBC
HEMATOCRIT: 21 % — AB (ref 36.0–46.0)
Hemoglobin: 6.1 g/dL — CL (ref 12.0–15.0)
MCH: 25 pg — ABNORMAL LOW (ref 26.0–34.0)
MCHC: 29 g/dL — ABNORMAL LOW (ref 30.0–36.0)
MCV: 86.1 fL (ref 78.0–100.0)
Platelets: 368 10*3/uL (ref 150–400)
RBC: 2.44 MIL/uL — ABNORMAL LOW (ref 3.87–5.11)
RDW: 17 % — AB (ref 11.5–15.5)
WBC: 8.7 10*3/uL (ref 4.0–10.5)

## 2014-09-02 LAB — BASIC METABOLIC PANEL
ANION GAP: 13 (ref 5–15)
BUN: 39 mg/dL — AB (ref 6–23)
CALCIUM: 7.8 mg/dL — AB (ref 8.4–10.5)
CHLORIDE: 97 meq/L (ref 96–112)
CO2: 27 mEq/L (ref 19–32)
CREATININE: 8.72 mg/dL — AB (ref 0.50–1.10)
GFR, EST AFRICAN AMERICAN: 5 mL/min — AB (ref >90)
GFR, EST NON AFRICAN AMERICAN: 4 mL/min — AB (ref >90)
Glucose, Bld: 106 mg/dL — ABNORMAL HIGH (ref 70–99)
Potassium: 3.7 mEq/L (ref 3.7–5.3)
Sodium: 137 mEq/L (ref 137–147)

## 2014-09-02 LAB — PREPARE RBC (CROSSMATCH)

## 2014-09-02 LAB — POCT I-STAT 3, ART BLOOD GAS (G3+)
ACID-BASE DEFICIT: 1 mmol/L (ref 0.0–2.0)
Bicarbonate: 23.3 mEq/L (ref 20.0–24.0)
O2 SAT: 99 %
PO2 ART: 111 mmHg — AB (ref 80.0–100.0)
TCO2: 24 mmol/L (ref 0–100)
pCO2 arterial: 36.3 mmHg (ref 35.0–45.0)
pH, Arterial: 7.413 (ref 7.350–7.450)

## 2014-09-02 LAB — GLUCOSE, CAPILLARY: Glucose-Capillary: 104 mg/dL — ABNORMAL HIGH (ref 70–99)

## 2014-09-02 MED ORDER — HEPARIN SODIUM (PORCINE) 1000 UNIT/ML IJ SOLN: 500.0000 [IU] | INTRAMUSCULAR | Status: DC

## 2014-09-02 MED ORDER — HEPARIN SODIUM (PORCINE) 1000 UNIT/ML IJ SOLN
2500.0000 [IU] | INTRAMUSCULAR | Status: DC
  Filled 2014-09-02 (×3): qty 2.5

## 2014-09-02 MED ORDER — HEPARIN SODIUM (PORCINE) 1000 UNIT/ML IJ SOLN
2500.0000 [IU] | INTRAMUSCULAR | Status: DC
Start: 2014-09-02 — End: 2014-09-02
  Filled 2014-09-02: qty 2.5

## 2014-09-02 MED ORDER — SODIUM CHLORIDE 0.9 % IV SOLN
Freq: Once | INTRAVENOUS | Status: AC
  Administered 2014-09-02: 10:00:00 via INTRAVENOUS

## 2014-09-02 MED ORDER — DIAZEPAM 5 MG PO TABS: 5.0000 mg | ORAL_TABLET | Freq: Four times a day (QID) | ORAL | Status: DC | PRN

## 2014-09-02 MED ORDER — DELFLEX-LC/1.5% DEXTROSE 346 MOSM/L IP SOLN: Freq: Once | INTRAPERITONEAL | Status: DC

## 2014-09-02 MED ORDER — FENTANYL 10 MCG/ML IV SOLN
INTRAVENOUS | Status: DC
  Administered 2014-09-03: 10 ug via INTRAVENOUS
  Administered 2014-09-03: 20 ug via INTRAVENOUS
  Administered 2014-09-03: 0 ug via INTRAVENOUS
  Administered 2014-09-03: 30 ug via INTRAVENOUS
  Administered 2014-09-03 – 2014-09-04 (×2): 0 ug via INTRAVENOUS
  Administered 2014-09-04: 10 ug via INTRAVENOUS
  Administered 2014-09-04: 0 ug via INTRAVENOUS

## 2014-09-02 NOTE — Progress Notes (Signed)
Assessment/Plan: 1. Cough, low-grade fever, DOE - sec to pulmonary infiltrates (L > R), 4-cm nodular infiltrate @ LUL; s/p VATS with LUL wedge resection  per Dr. Roxan Hockey, stable post-surgery. 2. ESRD - CCPD , followed by Dr. Moshe Cipro. COntinue to support with CCPD 3. Hypotension/volume - BP 103/52, on Midodrine 10 mg bid; wt 49.9 kg, no excess fluid. 4. ABLA 5. Nutrition - Last Alb 2.1, renal diet, vitamin. 6. ? Esophagitis - per CT 10/8, sliding hiatal hernia with esophageal wall thickening.   Subjective: Interval History: Post op disscomfort  Objective: Vital signs in last 24 hours: Temp:  [97.6 F (36.4 C)-98.1 F (36.7 C)] 97.6 F (36.4 C) (12/11 1409) Pulse Rate:  [43-99] 54 (12/11 1409) Resp:  [12-30] 18 (12/11 1409) BP: (80-157)/(56-106) 157/90 mmHg (12/11 1409) SpO2:  [91 %-100 %] 100 % (12/11 1409) Arterial Line BP: (98-138)/(48-66) 98/48 mmHg (12/11 0900) Weight:  [48.6 kg (107 lb 2.3 oz)] 48.6 kg (107 lb 2.3 oz) (12/11 1300) Weight change:   Intake/Output from previous day: 12/10 0701 - 12/11 0700 In: 1340.4 [I.V.:1340.4] Out: 70 [Blood:50; Chest Tube:20] Intake/Output this shift: Total I/O In: 698.5 [I.V.:75; Blood:623.5] Out: 90 [Chest Tube:90]  General appearance: alert and cooperative Resp: dimished BS on left with decreased excursion Chest wall: post op with bandage and CT on left. Cardio: regular rate and rhythm, S1, S2 normal, no murmur, click, rub or gallop Extremities: edema tr  Lab Results:  Recent Labs  09/02/14 0123 09/02/14 0445  WBC  --  8.7  HGB 6.8* 6.1*  HCT 20.0* 21.0*  PLT  --  368   BMET:  Recent Labs  09/02/14 0123 09/02/14 0445  NA 136* 137  K 3.7 3.7  CL 99 97  CO2  --  27  GLUCOSE 132* 106*  BUN 33* 39*  CREATININE 9.60* 8.72*  CALCIUM  --  7.8*   No results for input(s): PTH in the last 72 hours. Iron Studies: No results for input(s): IRON, TIBC, TRANSFERRIN, FERRITIN in the last 72  hours. Studies/Results: Dg Chest Port 1 View  09/02/2014   CLINICAL DATA:  58 year old female status post left upper lobe wedge resection.  EXAM: PORTABLE CHEST - 1 VIEW  COMPARISON:  Chest x-ray 09/01/2014.  FINDINGS: Left-sided chest tube with tip in the apex of the left hemithorax. Suture line in the apex of the left lung related to recent wedge resection. Previously noted tiny left apical pneumothorax is no longer confidently identified. Linear bibasilar opacities are favored to reflect some mild subsegmental atelectasis. No consolidative airspace disease. No pleural effusions. Heart size is normal. Upper mediastinal contours are within normal limits. Atherosclerosis in the thoracic aorta.  IMPRESSION: 1. Postoperative changes and support apparatus, as above. 2. No residual left apical pneumothorax confidently identified at this time. 3. Mild bibasilar subsegmental atelectasis. 4. Atherosclerosis.   Electronically Signed   By: Vinnie Langton M.D.   On: 09/02/2014 08:01   Dg Chest Port 1 View  09/01/2014   CLINICAL DATA:  Lung mass.  Status post surgery.  EXAM: PORTABLE CHEST - 1 VIEW  COMPARISON:  08/23/2014.  FINDINGS: Interval postoperative changes of LEFT upper lobe partial resection with a staple line at the LEFT lung apex. There is a LEFT thoracostomy tube with the tip at the apex. Tiny LEFT apical pneumothorax is present with pleural line visible between the posterior LEFT second and third ribs. Emphysema. Mild basilar atelectasis. Cardiopericardial silhouette and mediastinal contours are unchanged.  IMPRESSION: 1. Interval partial LEFT  upper lobectomy for resection of lung mass. 2. Tiny LEFT apical pneumothorax. 3. LEFT thoracostomy tube with the tip at the apex adjacent to the pneumothorax.   Electronically Signed   By: Dereck Ligas M.D.   On: 09/01/2014 15:49   Scheduled: . acetaminophen  1,000 mg Oral 4 times per day   Or  . acetaminophen (TYLENOL) oral liquid 160 mg/5 mL  1,000 mg  Oral 4 times per day  . antiseptic oral rinse  7 mL Mouth Rinse BID  . aspirin EC  81 mg Oral Daily  . [START ON 09/04/2014] calcitRIOL  0.5 mcg Oral Weekly  . cholecalciferol  3,000 Units Oral Daily  . darbepoetin (ARANESP) injection - NON-DIALYSIS  60 mcg Subcutaneous Q Thu-1800  . dialysis solution 1.5% low-MG/low-CA   Intraperitoneal Once in dialysis  . ferrous fumarate  1 tablet Oral QODAY  . gentamicin cream  1 application Topical TID  . lanthanum  1,000 mg Oral TID WC  . levothyroxine  50 mcg Oral QAC breakfast  . midodrine  10 mg Oral BID  . senna-docusate  1 tablet Oral QHS     LOS: 1 day   Roselyne Stalnaker C 09/02/2014,4:07 PM

## 2014-09-02 NOTE — Progress Notes (Signed)
INITIAL NUTRITION ASSESSMENT  DOCUMENTATION CODES Per approved criteria  -Non-severe (moderate) malnutrition in the context of chronic illness -Underweight   INTERVENTION: No nutrition intervention at this time --- patient declined RD to follow for nutrition care plan  NUTRITION DIAGNOSIS: Increased nutrient needs related to peritoneal dialysis as evidenced by estimated nutrition needs  Goal: Pt to meet >/= 90% of their estimated nutrition needs   Monitor:  PO & supplemental intake, weight, labs, I/O's  Reason for Assessment: BMI < 18.5  58 y.o. female  Admitting Dx: bilateral infiltrates  ASSESSMENT: 58 year old Female with a history of end-stage renal disease on peritoneal dialysis; presented for surgical intervention with dx of bilateral lung infiltrates with left apical calcified nodule.  Patient s/p procedures 12/10: LEFT VIDEO-ASSISTED THORACOSCOPY (VATS) WEDGE RESECTION OF APICAL MASS  Patient reports she's eating well.  Typically consumes 2-3 meals per day and that sometimes "it comes up".  She reports a 7 lb weight loss in the past month, however, per wt readings below, pt's weight has been stable.  Nutrient needs increased given PD.  Declined addition of oral nutrition supplements.  Nutrition Focused Physical Exam:  Subcutaneous Fat:  Orbital Region: N/A Upper Arm Region: moderate depletion Thoracic and Lumbar Region: N/A  Muscle:  Temple Region: mild depletion Clavicle Bone Region: mild to moderate depletion Clavicle and Acromion Bone Region: mild to moderate depletion Scapular Bone Region: N/A Dorsal Hand: N/A Patellar Region: mild to moderate depletion Anterior Thigh Region: mild to moderate depletion Posterior Calf Region: mild to moderate depletion  Edema: none  Patient meets criteria for moderate (non-severe) malnutrition in the context of chronic illness as evidenced by mild to moderate muscle/subcutaneous fat loss.  Height: Ht Readings from  Last 1 Encounters:  09/01/14 5\' 6"  (1.676 m)    Weight: Wt Readings from Last 1 Encounters:  09/01/14 110 lb 0.2 oz (49.901 kg)    Ideal Body Weight: 130 lb  % Ideal Body Weight: 85%  Wt Readings from Last 10 Encounters:  09/01/14 110 lb 0.2 oz (49.901 kg)  08/29/14 110 lb 0.2 oz (49.9 kg)  08/23/14 113 lb 9.6 oz (51.529 kg)  08/09/14 113 lb (51.256 kg)  07/04/14 117 lb 12.8 oz (53.434 kg)  06/20/14 118 lb (53.524 kg)  01/14/14 100 lb (45.36 kg)  12/14/13 101 lb (45.813 kg)  12/08/13 101 lb 3.2 oz (45.904 kg)  11/24/13 102 lb (46.267 kg)    Usual Body Weight: 113 lb  % Usual Body Weight: 103%  BMI:  Body mass index is 17.76 kg/(m^2).  Estimated Nutritional Needs: Kcal: 1500-1700 Protein: 75-85 gm Fluid: 1.5-1.7 L  Skin: surgical chest incision   Diet Order: Diet renal W/1267mL fluid restriction  EDUCATION NEEDS: -No education needs identified at this time   Intake/Output Summary (Last 24 hours) at 09/02/14 1225 Last data filed at 09/02/14 1100  Gross per 24 hour  Intake 1395.42 ml  Output     70 ml  Net 1325.42 ml   Labs:   Recent Labs Lab 08/29/14 1016 08/29/14 1023 09/01/14 0926 09/02/14 0123 09/02/14 0445  NA 138 136 135* 136* 137  K 3.1* 3.7 3.8 3.7 3.7  CL 93* 93*  --  99 97  CO2 29 28  --   --  27  BUN 33* 30*  --  33* 39*  CREATININE 8.77* 8.21*  --  9.60* 8.72*  CALCIUM 8.7 8.6  --   --  7.8*  GLUCOSE 148* 146* 93 132* 106*  CBG (last 3)   Recent Labs  09/01/14 1136 09/01/14 1445  GLUCAP 84 81    Scheduled Meds: . sodium chloride   Intravenous Once  . acetaminophen  1,000 mg Oral 4 times per day   Or  . acetaminophen (TYLENOL) oral liquid 160 mg/5 mL  1,000 mg Oral 4 times per day  . antiseptic oral rinse  7 mL Mouth Rinse BID  . aspirin EC  81 mg Oral Daily  . [START ON 09/04/2014] calcitRIOL  0.5 mcg Oral Weekly  . cholecalciferol  3,000 Units Oral Daily  . darbepoetin (ARANESP) injection - NON-DIALYSIS  60 mcg  Subcutaneous Q Thu-1800  . dialysis solution 1.5% low-MG/low-CA   Intraperitoneal Once in dialysis  . fentaNYL   Intravenous 6 times per day  . ferrous fumarate  1 tablet Oral QODAY  . gentamicin cream  1 application Topical TID  . lanthanum  1,000 mg Oral TID WC  . levothyroxine  50 mcg Oral QAC breakfast  . midodrine  10 mg Oral BID  . senna-docusate  1 tablet Oral QHS    Continuous Infusions: . dextrose 5 % and 0.9% NaCl 10 mL/hr at 09/02/14 0800  . heparin      Past Medical History  Diagnosis Date  . Kidney transplanted 01/2010    [facility], has had some rejection  . Hypothyroidism   . Anemia   . GERD (gastroesophageal reflux disease)   . Immunosuppression   . First degree AV block   . Premature atrial beat   . Premature ventricular beat   . Shingles 03/2011  . Hypertension     history; resolved on dialysis  . History of chronic cough   . History of hoarseness   . Heart murmur     child  . Stroke October 2010, March 2013    cerebral aneurysm  . Diabetes mellitus     medication induced with  steroids  . IgA nephropathy     on peritoneal dialysis  . Renal transplant failure and rejection 2012  . History of hiatal hernia     Past Surgical History  Procedure Laterality Date  . Kidney transplant Right 01/2010    [facility]  . Ureter revision  04/2011    [facility]  . Umbilical hernia repair  10/25/2009    open with mesh,  Dr [name]  . Capd insertion  08/25/2008    open, Dr [name]  . Capd removal  03/22/2010    Dr [name]  . Capd insertion N/A 12/14/2013    Procedure: LAPAROSCOPIC INSERTION CONTINUOUS AMBULATORY PERITONEAL DIALYSIS  (CAPD) CATHETER;  Surgeon: [name], MD;  Location: [place];  Service: General;  Laterality: N/A;  . Insertion of mesh N/A 12/14/2013    Procedure: INSERTION OF MESH;  Surgeon: [name], MD;  Location: [place];  Service: General;  Laterality: N/A;  . Laparoscopic lysis of adhesions N/A 12/14/2013    Procedure: LAPAROSCOPIC LYSIS OF  ADHESIONS;  Surgeon: [name], MD;  Location: [place];  Service: General;  Laterality: N/A;  . Ventral hernia repair N/A 12/14/2013    Procedure: LAPAROSCOPIC VENTRAL HERNIA;  Surgeon: [name], MD;  Location: [place];  Service: General;  Laterality: N/A;  . Video bronchoscopy Bilateral 07/05/2014    Procedure: VIDEO BRONCHOSCOPY WITH FLUORO;  Surgeon: [name], MD;  Location: [facility];  Service: Cardiopulmonary;  Laterality: Bilateral;  . Eye surgery      rdial keratotomy    Arthur Holms, RD,  LDN Pager #: [phone] After-Hours Pager #: [phone]

## 2014-09-02 NOTE — Progress Notes (Signed)
UR Completed.  [phone]  

## 2014-09-02 NOTE — Progress Notes (Signed)
CRITICAL VALUE ALERT  Critical value received:  hgb 6.1  Date of notification:  09/02/2014  Time of notification:  0549  Critical value read back:yes  Nurse who received alert:  Junious Dresser   MD notified (1st page):  Dr. Roxan Hockey  Time of first page: [phone]  MD notified (2nd page): n/a  Time of second page: n/a  Responding MD: Dr. Roxan Hockey   Time MD responded:  [phone]

## 2014-09-02 NOTE — Op Note (Signed)
Kimberly [name], Kimberly [name]              ACCOUNT NO.:  [id number]  MEDICAL RECORD NO.:  [id number]  LOCATION:  2S01C                        FACILITY:  [facility]  PHYSICIAN:  Revonda Standard. Roxan Hockey, M.D.DATE OF BIRTH:  10-19-1955  DATE OF PROCEDURE:  09/01/2014 DATE OF DISCHARGE:                              OPERATIVE REPORT   PREOPERATIVE DIAGNOSIS:  Bilateral lung infiltrates with left apical calcified nodule.  POSTOPERATIVE DIAGNOSIS:  Bilateral lung infiltrates with left apical calcified nodule.  PROCEDURES: 1. Left video-assisted thoracoscopy. 2. Wedge resection of apical mass. 3. On-Q local anesthetic catheter placement.  SURGEON:  Revonda Standard. Roxan Hockey, M.D.  ASSISTANT:  John Giovanni, PA-C.  ANESTHESIA:  General.  FINDINGS:  Mass in apex removed with wedge resection, mass with consistency of cancellous bone.  Frozen section showed extensive calcification.  No definitive diagnosis could be made.  CLINICAL NOTE:  Kimberly [name] is a 58 year old woman with end-stage renal disease, on peritoneal dialysis who has had persistent low-grade fevers of unknown origin.  She also has had a chronic cough for about 15 years. A CT of the chest showed bilateral pulmonary infiltrates, more so on the left than the right.  There was a 4.1 cm infiltrate/ mass in the left apex. There were also some small calcified nodules.  Bronchoscopy was nondiagnostic, and the patient was referred for surgical biopsy.  The indications, risks, benefits, and alternatives were discussed in detail with the patient.  She understood and accepted the risks and agreed to proceed.  DESCRIPTION OF PROCEDURE:  Kimberly [name] was brought to the preoperative holding area on September 01, 2014.  There, Anesthesia placed a central line and an arterial blood pressure monitoring line.  Intravenous antibiotics were administered.  She was taken to the operating room, anesthetized, and intubated with a double-lumen endotracheal  tube. Sequential compression devices were placed on the calves for DVT prophylaxis.  She was placed in a right lateral decubitus position, and the left chest was prepped and draped in the usual sterile fashion. Single lung ventilation of the right lung was initiated and was tolerated well throughout the procedure.  An incision was made in the seventh intercostal space in the midaxillary line.  This was carried through the skin and subcutaneous tissue.  The chest was entered bluntly using a 5-mm port.  Thoracoscope was advanced to the chest.  There was good isolation of the lung, but it was slow to deflate.  A small, 4 m, working incision was made in the fourth interspace anterolaterally.  No rib spreading was performed during the procedure.  There were some adhesions at the apex, which were taken down with electrocautery.  There were some mild emphysematous changes in the lung.  The only definite area of abnormality was in the left apex where there was a palpable mass.  A wedge resection was performed with sequential firings of an endoscopic GIA stapler using both purple and black cartridges.  The specimen was taken to the back table and divided, this was difficult to cut through with the scissors due to calcification.  A portion of the specimen was sent for AFB and fungal cultures.  Bacterial cultures were sent as well.  The remainder of  the specimen was sent to Pathology with a request for frozen section.  While awaiting the results of frozen section, further inspection was made.  There were no other easily accessible definitively abnormal areas of the lung accessible for biopsy.  There was no pleural effusion. There was no abnormality of the parietal pleura.  The staple line was inspected and there was good hemostasis.  An On-Q local anesthetic catheter was placed through a separate stab incision posteriorly and tunneled in a subpleural location.  It was primed with 5 mL of  0.25% Marcaine.  The frozen section returned showing extensive calcification.  No other more definitive diagnosis could be made.  Due to the degree of calcification, decalcification will be necessary prior to final pathology.  Final inspection was made for hemostasis.  A 28-French chest tube was placed through the original port incision and secured with a #1 silk suture.  The left lung was reinflated and there was good expansion of both lobes.  The utility incision was closed with #1 Vicryl fascial suture followed by 2-0 Vicryl subcutaneous suture and a 3-0 Vicryl subcuticular suture.  All sponge, needle, and instrument counts were correct at the end of the procedure.  The patient was taken from the operating room to the postanesthetic care unit, extubated in good condition.     Revonda Standard Roxan Hockey, M.D.     SCH/MEDQ  D:  09/01/2014  T:  09/02/2014  Job:  [id number]

## 2014-09-02 NOTE — Progress Notes (Signed)
Patient is SB in the 30-40's, upon initial report from dayshift nurse pt was NSR 60's, pt other VS are within normal limits, pt is not experiencing any mental status changes. Dr. Roxan Hockey notified, ABG and K+ drawn, Dr. Roxan Hockey notified of labs results. No other interventions at this time, will continue to monitor.

## 2014-09-02 NOTE — Clinical Documentation Improvement (Signed)
Possible Clinical Conditions? Severe Malnutrition   Protein Calorie Malnutrition Severe Protein Calorie Malnutrition Other Condition Cannot clinically determine  Supporting Information:  INITIAL NUTRITION ASSESSMENT done by Rogue Bussing, RD at 09/29/2014  DOCUMENTATION CODES  Per approved criteria   -Non-severe (moderate) malnutrition in the context of chronic illness  -Underweight   INTERVENTION:  . No nutrition intervention at this time --- patient declined  . RD to follow for nutrition care plan   Thank You, Alessandra Grout, RN, BSN, CCDS,Clinical Documentation Specialist:  [phone]  [phone]=[facility]- Health Information Management

## 2014-09-02 NOTE — Progress Notes (Signed)
1 Day Post-Op Procedure(s) (LRB): VIDEO ASSISTED THORACOSCOPY (VATS)/WEDGE RESECTION (Left) Subjective: C/o incisional pain  Objective: Vital signs in last 24 hours: Temp:  [97.7 F (36.5 C)-98.9 F (37.2 C)] 97.8 F (36.6 C) (12/11 0300) Pulse Rate:  [43-86] 53 (12/11 0700) Cardiac Rhythm:  [-] Sinus bradycardia (12/11 0000) Resp:  [12-27] 20 (12/11 0700) BP: (90-124)/(52-82) 102/66 mmHg (12/10 1800) SpO2:  [95 %-100 %] 99 % (12/11 0700) Arterial Line BP: (96-138)/(51-66) 125/55 mmHg (12/11 0700) Weight:  [110 lb 0.2 oz (49.901 kg)] 110 lb 0.2 oz (49.901 kg) (12/10 0908)  Hemodynamic parameters for last 24 hours:    Intake/Output from previous day: 12/10 0701 - 12/11 0700 In: 1340.4 [I.V.:1340.4] Out: 70 [Blood:50; Chest Tube:20] Intake/Output this shift:    General appearance: alert and no distress Neurologic: intact Heart: brady, regular Lungs: diminished breath sounds bibasilar no air leak, minimal drainage  Lab Results:  Recent Labs  09/02/14 0123 09/02/14 0445  WBC  --  8.7  HGB 6.8* 6.1*  HCT 20.0* 21.0*  PLT  --  368   BMET:  Recent Labs  09/02/14 0123 09/02/14 0445  NA 136* 137  K 3.7 3.7  CL 99 97  CO2  --  27  GLUCOSE 132* 106*  BUN 33* 39*  CREATININE 9.60* 8.72*  CALCIUM  --  7.8*    PT/INR: No results for input(s): LABPROT, INR in the last 72 hours. ABG    Component Value Date/Time   PHART 7.427 09/02/2014 0445   HCO3 27.6* 09/02/2014 0445   TCO2 28.9 09/02/2014 0445   ACIDBASEDEF 1.0 09/02/2014 0123   O2SAT 98.5 09/02/2014 0445   CBG (last 3)   Recent Labs  09/01/14 1136 09/01/14 1445  GLUCAP 84 81    Assessment/Plan: S/P Procedure(s) (LRB): VIDEO ASSISTED THORACOSCOPY (VATS)/WEDGE RESECTION (Left) Plan for transfer to step-down: see transfer orders   POD # 1 wedge resection of ossified mass LUL No air leak- CT to water seal- should be able to come out tomorrow PATH pending Anemia- primarily chronic as there was  minimal blood loss. However with Hgb= 6 I think she needs to be transfused ESRD- on PD per Nephrology Transfer to 3S   LOS: 1 day    Namish Krise C 09/02/2014

## 2014-09-03 ENCOUNTER — Inpatient Hospital Stay (HOSPITAL_COMMUNITY): Payer: BC Managed Care – PPO

## 2014-09-03 LAB — COMPREHENSIVE METABOLIC PANEL
ALT: 8 U/L (ref 0–35)
ANION GAP: 14 (ref 5–15)
AST: 10 U/L (ref 0–37)
Albumin: 0.8 g/dL — ABNORMAL LOW (ref 3.5–5.2)
Alkaline Phosphatase: 182 U/L — ABNORMAL HIGH (ref 39–117)
BUN: 39 mg/dL — AB (ref 6–23)
CALCIUM: 8 mg/dL — AB (ref 8.4–10.5)
CO2: 24 meq/L (ref 19–32)
CREATININE: 7.75 mg/dL — AB (ref 0.50–1.10)
Chloride: 96 mEq/L (ref 96–112)
GFR calc non Af Amer: 5 mL/min — ABNORMAL LOW (ref >90)
GFR, EST AFRICAN AMERICAN: 6 mL/min — AB (ref >90)
GLUCOSE: 117 mg/dL — AB (ref 70–99)
Potassium: 3.7 mEq/L (ref 3.7–5.3)
Sodium: 134 mEq/L — ABNORMAL LOW (ref 137–147)
Total Bilirubin: 0.3 mg/dL (ref 0.3–1.2)
Total Protein: 4.9 g/dL — ABNORMAL LOW (ref 6.0–8.3)

## 2014-09-03 LAB — TYPE AND SCREEN
ABO/RH(D): B POS
Antibody Screen: NEGATIVE
UNIT DIVISION: 0
Unit division: 0

## 2014-09-03 LAB — CBC
HCT: 31.3 % — ABNORMAL LOW (ref 36.0–46.0)
HEMOGLOBIN: 9.6 g/dL — AB (ref 12.0–15.0)
MCH: 25.4 pg — AB (ref 26.0–34.0)
MCHC: 30.7 g/dL (ref 30.0–36.0)
MCV: 82.8 fL (ref 78.0–100.0)
Platelets: 394 10*3/uL (ref 150–400)
RBC: 3.78 MIL/uL — AB (ref 3.87–5.11)
RDW: 15.9 % — ABNORMAL HIGH (ref 11.5–15.5)
WBC: 9.4 10*3/uL (ref 4.0–10.5)

## 2014-09-03 MED ORDER — HEPARIN SODIUM (PORCINE) 1000 UNIT/ML IJ SOLN
500.0000 [IU] | Freq: Once | INTRAMUSCULAR | Status: DC
  Filled 2014-09-03: qty 0.5

## 2014-09-03 MED ORDER — DELFLEX-LC/1.5% DEXTROSE 346 MOSM/L IP SOLN: Freq: Once | INTRAPERITONEAL | Status: DC

## 2014-09-03 NOTE — Progress Notes (Addendum)
[address]       [place] [address]             [phone]          2 Days Post-Op Procedure(s) (LRB): VIDEO ASSISTED THORACOSCOPY (VATS)/WEDGE RESECTION (Left)  Subjective: Comfortable, no complaints.   Objective: Vital signs in last 24 hours: Patient Vitals for the past 24 hrs:  BP Temp Temp src Pulse Resp SpO2 Weight  09/03/14 0328 - - - - (!) 22 100 % -  09/03/14 0321 131/81 mmHg 97.9 F (36.6 C) Oral (!) 50 (!) 22 100 % -  09/02/14 2316 - - - - (!) 24 94 % -  09/02/14 2315 (!) 159/80 mmHg 98 F (36.7 C) Oral (!) 45 (!) 21 95 % -  09/02/14 1948 - - - - (!) 23 93 % -  09/02/14 1922 (!) 167/94 mmHg 97.5 F (36.4 C) Oral (!) 44 16 100 % -  09/02/14 1530 (!) 143/76 mmHg 97.8 F (36.6 C) Oral (!) 44 (!) 22 96 % -  09/02/14 1500 136/76 mmHg 97.8 F (36.6 C) Oral (!) 46 (!) 27 97 % -  09/02/14 1409 (!) 157/90 mmHg 97.6 F (36.4 C) Oral (!) 54 18 100 % -  09/02/14 1315 107/70 mmHg 97.6 F (36.4 C) - - 15 - -  09/02/14 1300 (!) 134/106 mmHg - - (!) 55 (!) 21 93 % 107 lb 2.3 oz (48.6 kg)  09/02/14 1252 - - - 99 (!) 22 91 % -  09/02/14 1245 - - - (!) 53 19 98 % -  09/02/14 1239 121/76 mmHg 97.6 F (36.4 C) Oral - - - -  09/02/14 1230 - - - (!) 49 (!) 26 99 % -  09/02/14 1215 - - - (!) 46 (!) 24 98 % -  09/02/14 1212 - - - - (!) 30 100 % -  09/02/14 1200 106/77 mmHg 97.6 F (36.4 C) Oral (!) 52 (!) 25 99 % -  09/02/14 1130 - - - (!) 51 (!) 22 99 % -  09/02/14 1115 - - - (!) 56 (!) 24 99 % -  09/02/14 1100 (!) 80/56 mmHg 97.7 F (36.5 C) Oral (!) 55 (!) 25 99 % -  09/02/14 1045 - - - (!) 56 (!) 28 100 % -  09/02/14 1030 - - - 62 (!) 21 100 % -  09/02/14 1015 - - - 63 12 95 % -  09/02/14 1000 94/64 mmHg - - 63 (!) 27 100 % -  09/02/14 0945 90/64 mmHg - - 66 20 100 % -  09/02/14 0940 90/64 mmHg 98 F (36.7 C) - 64 (!) 26 100 % -  09/02/14 0900 - - - 63 19 97 % -  09/02/14 0852 - 97.6 F (36.4 C) Oral - - - -   Current Weight  09/02/14  107 lb 2.3 oz (48.6 kg)     Intake/Output from previous day: 12/11 0701 - 12/12 0700 In: 1528.5 [P.O.:480; I.V.:225; Blood:823.5] Out: 140 [Chest Tube:140]    PHYSICAL EXAM:  Heart: RRR Lungs: Slightly decreased BS on L Wound: Clean and dry Chest tube: +1-2/7 air leak with cough    Lab Results: CBC: Recent Labs  09/02/14 0445 09/03/14 0319  WBC 8.7 9.4  HGB 6.1* 9.6*  HCT 21.0* 31.3*  PLT 368 394   BMET:  Recent Labs  09/02/14 0445 09/03/14 0319  NA 137 134*  K 3.7  3.7  CL 97 96  CO2 27 24  GLUCOSE 106* 117*  BUN 39* 39*  CREATININE 8.72* 7.75*  CALCIUM 7.8* 8.0*    PT/INR: No results for input(s): LABPROT, INR in the last 72 hours.  CXR: FINDINGS: Left chest tube remains in place. A small left apical pneumothorax remains stable in size allowing for differences in patient positioning. Both lungs remain clear. Heart size is within normal limits .  IMPRESSION: Stable small left apical pneumothorax with left chest tube in place.   Assessment/Plan: S/P Procedure(s) (LRB): VIDEO ASSISTED THORACOSCOPY (VATS)/WEDGE RESECTION (Left) Pulm- stable, off O2. Continue pulm toilet/IS. CT with small air leak. CXR stable. Continue CT to water seal one more day. Acute on chronic anemia- improved after transfusion. ESRD- Continue PD per renal. CV- BPs better this am, continue Midodrine. Mobilize as tolerated. Path pending, cultures negative to date.   LOS: 2 days    COLLINS,GINA H 09/03/2014  Leave chest tube due to small air leak with cough patient examined and medical record reviewed,agree with above note. VAN TRIGT III,Travonna Swindle 09/03/2014

## 2014-09-03 NOTE — Progress Notes (Signed)
Assessment/Plan: 1. s/p VATS with LUL wedge resection per Dr. Roxan Hockey, stable post-surgery. 2. ESRD - CCPD , followed by Dr. Moshe Cipro. Continue to support with CCPD 3. Hypotension/volume - BP 103/52, on Midodrine 10 mg bid; wt 49.9 kg, no excess fluid.  Subjective: PD going well  Objective: Vital signs in last 24 hours: Temp:  [97.5 F (36.4 C)-98 F (36.7 C)] 97.9 F (36.6 C) (12/12 1121) Pulse Rate:  [44-57] 52 (12/12 1121) Resp:  [16-29] 18 (12/12 1143) BP: (120-167)/(76-94) 138/85 mmHg (12/12 1121) SpO2:  [93 %-100 %] 98 % (12/12 1143) Weight change: -1.301 kg (-2 lb 13.9 oz)  Intake/Output from previous day: 12/11 0701 - 12/12 0700 In: 1528.5 [P.O.:480; I.V.:225; Blood:823.5] Out: 140 [Chest Tube:140] Intake/Output this shift:    General appearance: alert and cooperative Extremities: extremities normal, atraumatic, no cyanosis or edema  Lab Results:  Recent Labs  09/02/14 0445 09/03/14 0319  WBC 8.7 9.4  HGB 6.1* 9.6*  HCT 21.0* 31.3*  PLT 368 394   BMET:  Recent Labs  09/02/14 0445 09/03/14 0319  NA 137 134*  K 3.7 3.7  CL 97 96  CO2 27 24  GLUCOSE 106* 117*  BUN 39* 39*  CREATININE 8.72* 7.75*  CALCIUM 7.8* 8.0*   No results for input(s): PTH in the last 72 hours. Iron Studies: No results for input(s): IRON, TIBC, TRANSFERRIN, FERRITIN in the last 72 hours. Studies/Results: Dg Chest Port 1 View  09/03/2014   CLINICAL DATA:  Postop from resection of left lung mass. Followup left pneumothorax.  EXAM: PORTABLE CHEST - 1 VIEW  COMPARISON:  09/02/2014  FINDINGS: Left chest tube remains in place. A small left apical pneumothorax remains stable in size allowing for differences in patient positioning. Both lungs remain clear. Heart size is within normal limits .  IMPRESSION: Stable small left apical pneumothorax with left chest tube in place.   Electronically Signed   By: Earle Gell M.D.   On: 09/03/2014 07:44   Dg Chest Port 1 View  09/02/2014    CLINICAL DATA:  58 year old female status post left upper lobe wedge resection.  EXAM: PORTABLE CHEST - 1 VIEW  COMPARISON:  Chest x-ray 09/01/2014.  FINDINGS: Left-sided chest tube with tip in the apex of the left hemithorax. Suture line in the apex of the left lung related to recent wedge resection. Previously noted tiny left apical pneumothorax is no longer confidently identified. Linear bibasilar opacities are favored to reflect some mild subsegmental atelectasis. No consolidative airspace disease. No pleural effusions. Heart size is normal. Upper mediastinal contours are within normal limits. Atherosclerosis in the thoracic aorta.  IMPRESSION: 1. Postoperative changes and support apparatus, as above. 2. No residual left apical pneumothorax confidently identified at this time. 3. Mild bibasilar subsegmental atelectasis. 4. Atherosclerosis.   Electronically Signed   By: Vinnie Langton M.D.   On: 09/02/2014 08:01   Dg Chest Port 1 View  09/01/2014   CLINICAL DATA:  Lung mass.  Status post surgery.  EXAM: PORTABLE CHEST - 1 VIEW  COMPARISON:  08/23/2014.  FINDINGS: Interval postoperative changes of LEFT upper lobe partial resection with a staple line at the LEFT lung apex. There is a LEFT thoracostomy tube with the tip at the apex. Tiny LEFT apical pneumothorax is present with pleural line visible between the posterior LEFT second and third ribs. Emphysema. Mild basilar atelectasis. Cardiopericardial silhouette and mediastinal contours are unchanged.  IMPRESSION: 1. Interval partial LEFT upper lobectomy for resection of lung mass. 2. Tiny  LEFT apical pneumothorax. 3. LEFT thoracostomy tube with the tip at the apex adjacent to the pneumothorax.   Electronically Signed   By: Dereck Ligas M.D.   On: 09/01/2014 15:49   Scheduled: . acetaminophen  1,000 mg Oral 4 times per day   Or  . acetaminophen (TYLENOL) oral liquid 160 mg/5 mL  1,000 mg Oral 4 times per day  . antiseptic oral rinse  7 mL Mouth  Rinse BID  . aspirin EC  81 mg Oral Daily  . [START ON 09/04/2014] calcitRIOL  0.5 mcg Oral Weekly  . cholecalciferol  3,000 Units Oral Daily  . darbepoetin (ARANESP) injection - NON-DIALYSIS  60 mcg Subcutaneous Q Thu-1800  . dialysis solution 1.5% low-MG/low-CA   Intraperitoneal Once in dialysis  . fentaNYL   Intravenous 6 times per day  . ferrous fumarate  1 tablet Oral QODAY  . gentamicin cream  1 application Topical TID  . lanthanum  1,000 mg Oral TID WC  . levothyroxine  50 mcg Oral QAC breakfast  . midodrine  10 mg Oral BID  . senna-docusate  1 tablet Oral QHS     LOS: 2 days   Damarious Holtsclaw C 09/03/2014,2:35 PM

## 2014-09-04 ENCOUNTER — Inpatient Hospital Stay (HOSPITAL_COMMUNITY): Payer: BC Managed Care – PPO

## 2014-09-04 LAB — WOUND CULTURE
Culture: NO GROWTH
Gram Stain: NONE SEEN

## 2014-09-04 MED ORDER — CALCITRIOL 0.5 MCG PO CAPS: 0.5000 ug | ORAL_CAPSULE | ORAL | Status: DC

## 2014-09-04 MED ORDER — POLYETHYLENE GLYCOL 3350 17 G PO PACK
17.0000 g | PACK | Freq: Every day | ORAL | Status: DC | PRN
  Administered 2014-09-04: 17 g via ORAL
  Filled 2014-09-04 (×2): qty 1

## 2014-09-04 NOTE — Progress Notes (Addendum)
       [address]       [place] [address]             [phone]          3 Days Post-Op Procedure(s) (LRB): VIDEO ASSISTED THORACOSCOPY (VATS)/WEDGE RESECTION (Left)  Subjective: Feels well, a little sore.  No other complaints.   Objective: Vital signs in last 24 hours: Patient Vitals for the past 24 hrs:  BP Temp Temp src Pulse Resp SpO2 Weight  09/04/14 0400 - - - - 15 - -  09/04/14 0343 (!) 155/86 mmHg 98.2 F (36.8 C) Oral (!) 57 13 97 % 101 lb 3.1 oz (45.9 kg)  09/04/14 0000 - - - - 15 97 % -  09/03/14 2329 127/79 mmHg 98.8 F (37.1 C) Oral 60 15 97 % -  09/03/14 2000 - - - - 19 96 % -  09/03/14 1919 106/80 mmHg 98.1 F (36.7 C) Oral 64 18 98 % -  09/03/14 1600 - - - - 17 95 % -  09/03/14 1507 - 97.7 F (36.5 C) - - - - -  09/03/14 1143 - - - - 18 98 % -  09/03/14 1121 138/85 mmHg 97.9 F (36.6 C) Oral (!) 52 (!) 23 96 % -  09/03/14 0941 120/82 mmHg 97.9 F (36.6 C) Oral (!) 57 (!) 29 96 % -   Current Weight  09/04/14 101 lb 3.1 oz (45.9 kg)     Intake/Output from previous day: 12/12 0701 - 12/13 0700 In: 240 [P.O.:240] Out: 110 [Chest Tube:110]    PHYSICAL EXAM:  Heart: RRR Lungs: Clear Wound: Clean and dry Chest tube: No air leak    Lab Results: CBC: Recent Labs  09/02/14 0445 09/03/14 0319  WBC 8.7 9.4  HGB 6.1* 9.6*  HCT 21.0* 31.3*  PLT 368 394   BMET:  Recent Labs  09/02/14 0445 09/03/14 0319  NA 137 134*  K 3.7 3.7  CL 97 96  CO2 27 24  GLUCOSE 106* 117*  BUN 39* 39*  CREATININE 8.72* 7.75*  CALCIUM 7.8* 8.0*    PT/INR: No results for input(s): LABPROT, INR in the last 72 hours.    Assessment/Plan: S/P Procedure(s) (LRB): VIDEO ASSISTED THORACOSCOPY (VATS)/WEDGE RESECTION (Left) Pulm- stable, off O2. Continue pulm toilet/IS. CT with no air leak today. Order for CXR changed from portable to 2 view, so CXR not done yet this am as pt is on peritoneal dialysis.  Will follow up on CXR- if  stable, will plan to d/c CT. Acute on chronic anemia- improved after transfusion. ESRD- Continue PD per renal. CV- BPs stable, continue Midodrine. Mobilize as tolerated. Path pending, cultures negative to date.   LOS: 3 days    COLLINS,GINA H 09/04/2014  ADDENDUM: CXR reordered at 9:30 as portable and as of 11:38, this has still not been completed.  MD will follow up.   Patient feeling better today Chest x-ray shows small residual left apical pneumothorax No air leak from chest tube Chest tube pull the day--chest x-ray in a.m. prior to discharge.  patient examined and medical record reviewed,agree with above note. VAN TRIGT III,Britanni Yarde 09/04/2014

## 2014-09-04 NOTE — Progress Notes (Signed)
Wasted fentanyl in sink, 26 mL (260 mcg). Witnessed by [employer].

## 2014-09-04 NOTE — Progress Notes (Signed)
09/04/2014 10:11 PM  Patient's RN called stating that patients pause was completed. Went down to start her normal treatment and the machine continued to beep and stated "AIR IN CASSETTE". After trying multiple times to fix the machine it then stated that the patient needed to be disconnected and treatment completed. Explained to the patient that I would have to start her treatment all the way over with new bags and new tubing since it shut off by itself. She stated that she did not want to start over and that she would just miss this one treatment since she was being discharged in the morning. I asked her 3 more times if she was sure that she did not want me to re-start her treatment and she consistently stated "no". Took the fluids and machine out of her room and properly clamped her off.  Debbora Dus RN present during this encounter  Kimberly [name], Kimberly [name]

## 2014-09-05 ENCOUNTER — Inpatient Hospital Stay (HOSPITAL_COMMUNITY): Payer: BC Managed Care – PPO

## 2014-09-05 LAB — TISSUE CULTURE
CULTURE: NO GROWTH
Gram Stain: NONE SEEN

## 2014-09-05 MED ORDER — ACETAMINOPHEN 500 MG PO TABS: 1000.0000 mg | ORAL_TABLET | Freq: Four times a day (QID) | ORAL | Status: DC | PRN

## 2014-09-05 NOTE — Progress Notes (Signed)
Received orders to D/c pt home, pt aware of D/C, no s/s of acute distress noted. D/C instructions given to pt, pt verbalizes understanding. Pt d/c'd home accompanied by mother-in-law.

## 2014-09-05 NOTE — Discharge Summary (Signed)
[address]       [place] [address]             [phone]              Discharge Summary  Name: Kimberly [name] DOB: [birth year] 58 y.o. 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 Kidney transplanted 01/2010    [facility], has had some rejection  . Hypothyroidism   . Anemia   . GERD (gastroesophageal reflux disease)   . Immunosuppression   . First degree AV block   . Premature atrial beat   . Premature ventricular beat   . Shingles 03/2011  . Hypertension     history; resolved on dialysis  . History of chronic cough   . History of hoarseness   . Heart murmur     child  . Stroke October 2010, March 2013    cerebral aneurysm  . Diabetes mellitus     medication induced with  steroids  . IgA nephropathy     on peritoneal dialysis  . Renal transplant failure and rejection 2012  . History of hiatal hernia      Procedures: LEFT VIDEO ASSISTED THORACOSCOPY  LEFT UPPER LOBE WEDGE RESECTION - 09/01/2014    HPI:  The patient is a 58 y.o. female with a history of end-stage renal disease. She had a renal transplant back in 2011. That failed about a year later and she has been on peritoneal dialysis since then. She also has a past history of diabetes, hypertension, hypothyroidism, and a heart murmur. She has a brain aneurysm and has had 2 strokes previously. She is a lifelong nonsmoker.  She says that over the past several months she's been having low-grade fevers. These would go up to around 100.6. She had them just about every day. She has a chronic cough that she's had for about 15 years. This is a dry cough. She also  has occasional wheezing. As part of her fever workup a chest x-ray was done. That led to a CT of the chest which showed bilateral pulmonary infiltrates left greater than right. There was a 4.1 cm infiltrate in the left apex. She was referred to Dr. Melvyn Novas who did a bronchoscopy. Pathology and cultures were unrevealing.  In addition to the above-noted symptoms, she complains of getting short of breath with exertion. She says that she can walk about 10 minutes on level ground. She has chest pain which is usually associated with coughing. She does not have any classic anginal pressure or tightness with exertion. She has had dizzy spells. She also complains of bruising easily. She says that she has had difficulty with low blood pressure especially recently since she was started on a fifth PD exchange daily.  She was referred to Dr. Roxan Hockey for thoracic surgical consultation. He felt that she would benefit from a left VATS/wedge resection at this time for diagnostic purposes.  All risks, benefits and alternatives of surgery were explained in detail, and the patient agreed to proceed.   Hospital Course:  The patient was admitted to [facility] on 09/01/2014. The patient was taken to the operating room and underwent the above procedure.  The postoperative course has generally been uneventful.  Nephrology was consulted for assistance in management of her peritoneal dialysis.  She has continued her regular home schedule and her renal function has remained stable.  The patient has otherwise done well postoperatively.  Her chest tube was removed in the standard fashion and follow up chest x-rays have shown no pneumothorax.  She is ambulating in the halls and has been weaned from supplemental oxygen.  Incisions are healing well.  Final pathology is pending, but Dr. Roxan Hockey spoke with Dr. 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  The patient has been evaluated on today's date and is medically stable for discharge home.    Recent vital signs:  Filed Vitals:   09/05/14 0707  BP: 114/74  Pulse: 64  Temp:   Resp: 12    Recent laboratory studies:  CBC:  Recent Labs  09/03/14 0319  WBC 9.4  HGB 9.6*  HCT 31.3*  PLT 394   BMET:   Recent Labs  09/03/14 0319  NA 134*  K 3.7  CL 96  CO2 24  GLUCOSE 117*  BUN 39*  CREATININE 7.75*  CALCIUM 8.0*    PT/INR: No results for input(s): LABPROT, INR in the last 72 hours.   Discharge Medications:     Medication List    TAKE these medications        acetaminophen 500 MG tablet  Commonly known as:  TYLENOL  Take 2 tablets (1,000 mg total) by mouth every 6 (six) hours as needed (pain).     aspirin 81 MG tablet  Take 81 mg by mouth daily.     calcitRIOL 0.5 MCG capsule  Commonly known as:  ROCALTROL  Take 0.5 mcg by mouth once a week. Monday     diazepam 5 MG tablet  Commonly known as:  VALIUM  Take 5 mg by mouth every 6 (six) hours as needed for anxiety.     ferrous fumarate 325 (106 FE) MG Tabs tablet  Commonly known as:  HEMOCYTE - 106 mg FE  Take 1 tablet by mouth every other day.     gentamicin cream 0.1 %  Commonly known as:  GARAMYCIN  Apply 1 application topically 3 (three) times daily.     guaiFENesin-codeine 100-10 MG/5ML syrup  Take 5 mLs by mouth every 4 (four) hours as needed for cough.     lanthanum 1000 MG chewable tablet  Commonly known as:  FOSRENOL  Chew 1,000 mg by mouth 3 (three) times daily with meals.     levothyroxine 50 MCG tablet  Commonly known as:  SYNTHROID, LEVOTHROID  Take 50 mcg by mouth daily before breakfast.     midodrine 10 MG tablet  Commonly known as:  PROAMATINE  Take 10 mg by mouth 2 (two) times daily.     MIRCERA 75 MCG/0.3ML Sosy  Generic drug:  Methoxy PEG-Epoetin Beta  Inject 150 mcg as directed every 14 (fourteen) days.  Gets 75 mcg in each arm .     OVER THE COUNTER MEDICATION  Take 2 capsules by mouth 3 (three) times daily. Juice plus capsules     potassium chloride SA 20 MEQ tablet  Commonly known as:  K-DUR,KLOR-CON  Take 1 tablet (20 mEq total) by mouth daily.     Vitamin D3 3000 UNITS Tabs  Take 3,000 Units by mouth daily.  Discharge Instructions:  The patient is to refrain from driving, heavy lifting or strenuous activity.  May shower daily and clean incisions with soap and water.  May resume regular diet.   Follow Up:    Follow-up Information    Follow up with [name], MD.   Specialty:  Cardiothoracic Surgery   Why:  Office will contact you with an appointment   Contact information:   [place] [address] [phone]       Follow up with [facility].   Why:  For suture removal - Office will call to schedule appointment       [place] 09/05/2014, 9:38 AM

## 2014-09-05 NOTE — Progress Notes (Addendum)
       [address]       [place] [address]             [phone]          4 Days Post-Op Procedure(s) (LRB): VIDEO ASSISTED THORACOSCOPY (VATS)/WEDGE RESECTION (Left)  Subjective: Feels better after CT d/c'ed.  Walking in halls, breathing stable.   Objective: Vital signs in last 24 hours: Patient Vitals for the past 24 hrs:  BP Temp Temp src Pulse Resp SpO2 Weight  09/05/14 0700 - 98.2 F (36.8 C) Oral - - - -  09/05/14 0355 109/75 mmHg 98.5 F (36.9 C) Oral 78 16 99 % 162 lb (73.483 kg)  09/04/14 2348 129/81 mmHg 98 F (36.7 C) Oral 69 14 93 % -  09/04/14 1924 (!) 148/89 mmHg - - (!) 57 (!) 21 99 % -  09/04/14 1900 - 98.6 F (37 C) Oral - - - -  09/04/14 1530 124/89 mmHg - - (!) 59 (!) 23 98 % -  09/04/14 1527 - 98.4 F (36.9 C) Oral - - - -  09/04/14 1130 138/81 mmHg - - (!) 56 19 96 % -  09/04/14 1123 - 98.5 F (36.9 C) Oral - 20 97 % -  09/04/14 0816 - 97.9 F (36.6 C) Oral - 16 96 % -  09/04/14 0745 106/69 mmHg - - (!) 57 (!) 25 96 % -   Current Weight  09/05/14 162 lb (73.483 kg)     Intake/Output from previous day: 12/13 0701 - 12/14 0700 In: 780 [P.O.:700; I.V.:80] Out: 40 [Chest Tube:40]    PHYSICAL EXAM:  Heart: RRR Lungs: Clear Wound: Clean and dry     Lab Results: CBC: Recent Labs  09/03/14 0319  WBC 9.4  HGB 9.6*  HCT 31.3*  PLT 394   BMET:  Recent Labs  09/03/14 0319  NA 134*  K 3.7  CL 96  CO2 24  GLUCOSE 117*  BUN 39*  CREATININE 7.75*  CALCIUM 8.0*    PT/INR: No results for input(s): LABPROT, INR in the last 72 hours.  CXR: FINDINGS: Interval removal of left chest tube. Left apical pneumothorax again noted. Mediastinum and hilar structures are normal . Multiple pulmonary nodules again noted. These are better demonstrated by prior CT of 06/30/2014. Conglomeration of pulmonary nodules in the left pulmonary apex is less prominent. No pleural effusion. Heart size and pulmonary vascularity  normal. No acute osseous abnormality.  IMPRESSION: 1. Interval removal of left chest tube. Known small left apical pneumothorax again noted. 2. Pulmonary nodules are again noted. These are better demonstrated by CT of 06/30/2014. Large conglomeration of left apical pulmonary nodules has diminished in size.   Assessment/Plan: S/P Procedure(s) (LRB): VIDEO ASSISTED THORACOSCOPY (VATS)/WEDGE RESECTION (Left) CXR stable following CT removal. Pt doing well overall. Path still pending, cultures negative. Plan d/c home today. Will follow up path as outpatient.   LOS: 4 days    COLLINS,GINA H 09/05/2014  Looks good Final path report pending but I spoke to Dr. 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 A relatively rare disorder, usually in patients with renal failure Her calcium level is low- will check PTH level anyway Home today

## 2014-09-05 NOTE — Anesthesia Postprocedure Evaluation (Signed)
  Anesthesia Post-op Note  Patient: Kimberly [name]  Procedure(s) Performed: Procedure(s): VIDEO ASSISTED THORACOSCOPY (VATS)/WEDGE RESECTION (Left)  Patient Location: PACU  Anesthesia Type:General  Level of Consciousness: awake and alert   Airway and Oxygen Therapy: Patient Spontanous Breathing  Post-op Pain: mild  Post-op Assessment: Post-op Vital signs reviewed  Post-op Vital Signs: stable  Last Vitals:  Filed Vitals:   09/05/14 0707  BP: 114/74  Pulse: 64  Temp:   Resp: 12    Complications: No apparent anesthesia complications

## 2014-09-06 LAB — ANAEROBIC CULTURE: Gram Stain: NONE SEEN

## 2014-09-06 LAB — PTH, INTACT AND CALCIUM
Calcium, Total (PTH): 7.7 mg/dL — ABNORMAL LOW (ref 8.4–10.5)
PTH: 258 pg/mL — ABNORMAL HIGH (ref 14–64)

## 2014-09-12 ENCOUNTER — Ambulatory Visit (INDEPENDENT_AMBULATORY_CARE_PROVIDER_SITE_OTHER): Payer: Self-pay

## 2014-09-12 DIAGNOSIS — R918 Other nonspecific abnormal finding of lung field: Secondary | ICD-10-CM

## 2014-09-12 DIAGNOSIS — Z4802 Encounter for removal of sutures: Secondary | ICD-10-CM

## 2014-09-12 NOTE — Progress Notes (Signed)
Removed 1 suture from chest tube site. No signs of infection and pt tolerated well. 

## 2014-09-27 ENCOUNTER — Ambulatory Visit (HOSPITAL_COMMUNITY)
Admission: RE | Admit: 2014-09-27 | Discharge: 2014-09-27 | Disposition: A | Discharge status: Home / Self Care | Payer: BLUE CROSS/BLUE SHIELD | Source: Ambulatory Visit | Attending: Internal Medicine | Admitting: Internal Medicine

## 2014-09-27 DIAGNOSIS — R1013 Epigastric pain: Secondary | ICD-10-CM

## 2014-09-27 DIAGNOSIS — Z940 Kidney transplant status: Secondary | ICD-10-CM | POA: Diagnosis not present

## 2014-09-27 DIAGNOSIS — R509 Fever, unspecified: Secondary | ICD-10-CM | POA: Diagnosis not present

## 2014-09-27 DIAGNOSIS — G8929 Other chronic pain: Secondary | ICD-10-CM

## 2014-09-27 DIAGNOSIS — N200 Calculus of kidney: Secondary | ICD-10-CM | POA: Diagnosis not present

## 2014-09-27 DIAGNOSIS — R5081 Fever presenting with conditions classified elsewhere: Secondary | ICD-10-CM

## 2014-09-27 MED ORDER — IOHEXOL 300 MG/ML  SOLN
100.0000 mL | Freq: Once | INTRAMUSCULAR | Status: AC | PRN
  Administered 2014-09-27: 80 mL via INTRAVENOUS

## 2014-09-29 ENCOUNTER — Other Ambulatory Visit: Payer: Self-pay | Admitting: Thoracic Surgery (Cardiothoracic Vascular Surgery)

## 2014-09-29 ENCOUNTER — Telehealth: Payer: Self-pay | Admitting: Internal Medicine

## 2014-09-29 DIAGNOSIS — R918 Other nonspecific abnormal finding of lung field: Secondary | ICD-10-CM

## 2014-09-29 NOTE — Telephone Encounter (Signed)
Pt states she had bronch in 06-2014. Since then her Insurance has sent her papers stating that the procedure was denied as we did not seek approval prior to procedure. Pt is now getting bills for this and needs our help in taking care of this.   Insurance told her we need to have MW contact the peer to peer review department and seek approval. Their number is [phone].  The CPT code on file with insurance is [phone].    Spoke with [employer] about this and will forward to [facility] box to work on this. Thanks.

## 2014-09-30 NOTE — Telephone Encounter (Signed)
Spoke to claims dept -precert was not required however ins was billed on 07/07/14 1985.00 for which ins paid 633.60 pt was due 70.40 total amount 704.00 was [facility]'s contracted amount, ins said [facility] has to write off the amount  Of 1281.00 so not sure why pt got a bill but spoke to her and she will call the billiing dept again Joellen [place]

## 2014-10-01 LAB — FUNGUS CULTURE W SMEAR
Fungal Smear: NONE SEEN
Fungal Smear: NONE SEEN

## 2014-10-03 ENCOUNTER — Encounter: Payer: Self-pay | Admitting: Internal Medicine

## 2014-10-03 ENCOUNTER — Ambulatory Visit (INDEPENDENT_AMBULATORY_CARE_PROVIDER_SITE_OTHER): Payer: BLUE CROSS/BLUE SHIELD | Admitting: Internal Medicine

## 2014-10-03 VITALS — BP 114/80 | HR 76 | Temp 98.2°F | Wt 114.0 lb

## 2014-10-03 DIAGNOSIS — R1030 Lower abdominal pain, unspecified: Secondary | ICD-10-CM | POA: Diagnosis not present

## 2014-10-03 DIAGNOSIS — R918 Other nonspecific abnormal finding of lung field: Secondary | ICD-10-CM | POA: Diagnosis not present

## 2014-10-03 DIAGNOSIS — R05 Cough: Secondary | ICD-10-CM | POA: Diagnosis not present

## 2014-10-03 DIAGNOSIS — J984 Other disorders of lung: Secondary | ICD-10-CM

## 2014-10-03 DIAGNOSIS — R509 Fever, unspecified: Secondary | ICD-10-CM | POA: Diagnosis not present

## 2014-10-03 DIAGNOSIS — R053 Chronic cough: Secondary | ICD-10-CM

## 2014-10-03 NOTE — Progress Notes (Signed)
Subjective:    Patient ID: Kimberly [name], female    DOB: 31-Mar-1956, 59 y.o.   MRN: [id number]  HPI 59yo F with history of failed renal txp now on PD as march 2015, was referred back in Fall 2015 for FUO. Work up revealed left upper lobe cavitary lesion, bronchoscopy which was non-diagnostics. She underwent VATs wedge resection in 09/01/14, path revealed no malignancy, stains negative for fungal or AFB. Cultures are NGTD at 4 wks. She is recovering from her thoracic surgery well, still has some sensitivity/soreness to area of incision, but incision site is well healed. She also had abd/pelvic CT that did not show any pathology that would be concerning for intra-abdominal abscess but hydronephrosis to her native kidney which she is known to have. She states that she does not have nightly fevers anymore. Mostly, she notices still having her chronic cough, throughout the day, worst at night. Cough medication helps her sleep. She notices some epigastric discomfort that is often relieved by belching. She does not use tums or PPI on any regular basis.she also complains of lower abdominal pain that she wonders if her pd catheter gets malpositioned.  She is seeing urology this week for cystoscopy to evaluate intermittent hematuria she is having.  Current Outpatient Prescriptions on File Prior to Visit  Medication Sig Dispense Refill  . acetaminophen (TYLENOL) 500 MG tablet Take 2 tablets (1,000 mg total) by mouth every 6 (six) hours as needed (pain). 30 tablet 0  . aspirin 81 MG tablet Take 81 mg by mouth daily.    . calcitRIOL (ROCALTROL) 0.5 MCG capsule Take 0.5 mcg by mouth once a week. Monday    . Cholecalciferol (VITAMIN D3) 3000 UNITS TABS Take 3,000 Units by mouth daily.    . diazepam (VALIUM) 5 MG tablet Take 5 mg by mouth every 6 (six) hours as needed for anxiety.    . ferrous fumarate (HEMOCYTE - 106 MG FE) 325 (106 FE) MG TABS tablet Take 1 tablet by mouth every other day.    Marland Kitchen gentamicin  cream (GARAMYCIN) 0.1 % Apply 1 application topically 3 (three) times daily.     Marland Kitchen lanthanum (FOSRENOL) 1000 MG chewable tablet Chew 1,000 mg by mouth 3 (three) times daily with meals.    Marland Kitchen levothyroxine (SYNTHROID, LEVOTHROID) 50 MCG tablet Take 50 mcg by mouth daily before breakfast.    . Methoxy PEG-Epoetin Beta (MIRCERA) 75 MCG/0.3ML SOSY Inject as directed.    . midodrine (PROAMATINE) 10 MG tablet Take 10 mg by mouth 2 (two) times daily.    Marland Kitchen OVER THE COUNTER MEDICATION Take 2 capsules by mouth 3 (three) times daily. Juice plus capsules    . potassium chloride SA (K-DUR,KLOR-CON) 20 MEQ tablet Take 1 tablet (20 mEq total) by mouth daily. 30 tablet 0   No current facility-administered medications on file prior to visit.   Active Ambulatory Problems    Diagnosis Date Noted  . HYPOTHYROIDISM 10/12/2007  . ANEMIA 10/12/2007  . HYPERTENSION 10/12/2007  . CARDIAC ARRHYTHMIA 10/12/2007  . ASTHMA 10/12/2007  . GERD 12/07/2007  . CKD (chronic kidney disease) stage 4, GFR 15-29 ml/min 10/12/2007  . COUGH 11/09/2007  . Kidney transplanted   . IgA nephropathy   . Stroke 11/29/2011  . DM type 2 causing complication 123456  . PVC's (premature ventricular contractions) 01/17/2012  . Secondary hyperparathyroidism (of renal origin) 04/20/2013  . Peritoneal dialysis catheter in place March 2015 01/14/2014  . Ventral hernia s/p lap repair w mesh March  2015 01/14/2014  . Pulmonary infiltrates 07/05/2014  . Lung mass 09/01/2014   Resolved Ambulatory Problems    Diagnosis Date Noted  . TONSILLITIS 10/12/2007  . ALLERGIC RHINITIS 10/12/2007   Past Medical History  Diagnosis Date  . Hypothyroidism   . Anemia   . GERD (gastroesophageal reflux disease)   . Immunosuppression   . First degree AV block   . Premature atrial beat   . Premature ventricular beat   . Shingles 03/2011  . Hypertension   . History of chronic cough   . History of hoarseness   . Heart murmur   . Diabetes mellitus    . Renal transplant failure and rejection 2012  . History of hiatal hernia     Social hx: she has retired this past week from working  Review of Systems Review of Systems  Constitutional: Negative for fever, chills, diaphoresis, activity change, appetite change, fatigue and unexpected weight change.  HENT: Negative for congestion, sore throat, rhinorrhea, sneezing, trouble swallowing and sinus pressure.  Eyes: Negative for photophobia and visual disturbance.  Respiratory: positive for cough,but negative for chest tightness, shortness of breath, wheezing and stridor.  Cardiovascular: Negative for chest pain, palpitations and leg swelling.  Gastrointestinal: postive for abdominal distention. Negative for nausea, vomiting, abdominal pain, diarrhea, constipation, blood in stool,  and anal bleeding.  Genitourinary: Negative for dysuria, but occ. Hematuria,and negative for flank pain and difficulty urinating.  Musculoskeletal: Negative for myalgias, back pain, joint swelling, arthralgias and gait problem.  Skin: Negative for color change, pallor, rash and wound.  Neurological: Negative for dizziness, tremors, weakness and light-headedness.  Hematological: Negative for adenopathy. Does not bruise/bleed easily.  Psychiatric/Behavioral: Negative for behavioral problems, confusion, sleep disturbance, dysphoric mood, decreased concentration and agitation.       Objective:   Physical Exam  BP 114/80 mmHg  Pulse 76  Temp(Src) 98.2 F (36.8 C) (Oral)  Wt 114 lb (51.71 kg) Physical Exam  Constitutional:  oriented to person, place, and time. appears well-developed and well-nourished. No distress.  HENT:  Mouth/Throat: Oropharynx is clear and moist. No oropharyngeal exudate.  Cardiovascular: Normal rate, regular rhythm and normal heart sounds. Exam reveals no gallop and no friction rub.  No murmur heard.  Pulmonary/Chest: Effort normal and breath sounds normal. No respiratory distress.  has no  wheezes.  Chest wall = skin incision is well healed on left side, no erythema or fluctuance. Mild tenderness to touch  Studies: 1/5 abd ct  IMPRESSION: 1. Mildly motion degraded exam. 2. Intraperitoneal fluid and air, likely related to intraperitoneal dialysis. No evidence of abscess. 3. Similar native right renal hydroureteronephrosis and cortical thinning. 4. Development of right pelvic transplant hydronephrosis and presumed decreased transplant function as evidenced by lack of collecting system contrast on delayed images. PeriTransplant edema is mild and nonspecific. Possibly related to at Transplant failure. Superinfection cannot be excluded. 5. Minimal pneumobilia. 6. Nephrolithiasis.     Assessment & Plan:  FUO = now resolved, unclear if it resolved post surgical resection of abn lung tissue. We will follow up culture results to see if anything further grows. At 4 wks, no growth noted.  Chronic cough = asked her to consider doing a trial of tums or proton pump inhibitor to see if she notices any improvement in symptoms  Abdominal discomfort= unclear if it could be referred pain for native kidney hydronephrosis, she is following with urology. If it is not renal related,may consider antispasmotic to see if it helps symptoms.  rtc prn

## 2014-10-04 ENCOUNTER — Ambulatory Visit
Admission: RE | Admit: 2014-10-04 | Discharge: 2014-10-04 | Disposition: A | Discharge status: Home / Self Care | Payer: BLUE CROSS/BLUE SHIELD | Source: Ambulatory Visit | Attending: Thoracic Surgery (Cardiothoracic Vascular Surgery) | Admitting: Thoracic Surgery (Cardiothoracic Vascular Surgery)

## 2014-10-04 ENCOUNTER — Ambulatory Visit (INDEPENDENT_AMBULATORY_CARE_PROVIDER_SITE_OTHER): Payer: Self-pay | Admitting: Thoracic Surgery (Cardiothoracic Vascular Surgery)

## 2014-10-04 ENCOUNTER — Encounter: Payer: Self-pay | Admitting: Thoracic Surgery (Cardiothoracic Vascular Surgery)

## 2014-10-04 VITALS — BP 109/79 | HR 79 | Resp 20 | Ht 66.0 in | Wt 114.0 lb

## 2014-10-04 DIAGNOSIS — R918 Other nonspecific abnormal finding of lung field: Secondary | ICD-10-CM

## 2014-10-04 NOTE — Progress Notes (Signed)
HPI: [name] returns today for a scheduled postoperative follow-up visit.  She is a 59 year old woman who has a history of end-stage renal disease and is on peritoneal dialysis. She's had a chronic cough for years. She recently was found to have multiple pulmonary infiltrates bilaterally. In the left upper lobe there was an area where it was confluent and had some calcification. We did a lung biopsy on her on December 10. This area had the consistency of cancellous bone. On pathology there was metastatic calcification. There were some incidental carcinoid tumorlets of note clinical significance.  She did well postoperatively.  We did check her parathyroid hormone level prior to discharge and it was 258, her calcium was low at 7.7.  She continues to have some incisional pain. She is not taking any narcotics. She cannot sleep on her left side. She has been driving. Her cough persists.  Past Medical History  Diagnosis Date  . Kidney transplanted 01/2010    [facility], has had some rejection  . Hypothyroidism   . Anemia   . GERD (gastroesophageal reflux disease)   . Immunosuppression   . First degree AV block   . Premature atrial beat   . Premature ventricular beat   . Shingles 03/2011  . Hypertension     history; resolved on dialysis  . History of chronic cough   . History of hoarseness   . Heart murmur     child  . Stroke October 2010, March 2013    cerebral aneurysm  . Diabetes mellitus     medication induced with  steroids  . IgA nephropathy     on peritoneal dialysis  . Renal transplant failure and rejection 2012  . History of hiatal hernia       Current Outpatient Prescriptions  Medication Sig Dispense Refill  . aspirin 81 MG tablet Take 81 mg by mouth daily.    . calcitRIOL (ROCALTROL) 0.5 MCG capsule Take 0.5 mcg by mouth once a week. Monday    . Cholecalciferol (VITAMIN D3) 3000 UNITS TABS Take 3,000 Units by mouth daily.    . diazepam (VALIUM) 5 MG tablet Take 5 mg  by mouth every 6 (six) hours as needed for anxiety.    . ferrous fumarate (HEMOCYTE - 106 MG FE) 325 (106 FE) MG TABS tablet Take 1 tablet by mouth every other day.    Marland Kitchen gentamicin cream (GARAMYCIN) 0.1 % Apply 1 application topically 3 (three) times daily.     Marland Kitchen lanthanum (FOSRENOL) 1000 MG chewable tablet Chew 1,000 mg by mouth 3 (three) times daily with meals.    Marland Kitchen levothyroxine (SYNTHROID, LEVOTHROID) 50 MCG tablet Take 50 mcg by mouth daily before breakfast.    . Methoxy PEG-Epoetin Beta (MIRCERA) 75 MCG/0.3ML SOSY Inject as directed.    . midodrine (PROAMATINE) 10 MG tablet Take 10 mg by mouth 2 (two) times daily.    Marland Kitchen OVER THE COUNTER MEDICATION Take 2 capsules by mouth 3 (three) times daily. Juice plus capsules     No current facility-administered medications for this visit.    Physical Exam BP 109/79 mmHg  Pulse 79  Resp 20  Ht 5\' 6"  (1.676 m)  Wt 114 lb (51.71 kg)  BMI 18.41 kg/m2  SpO66 11% 59 year old woman in no acute distress Alert and oriented 3 Lungs clear bilaterally Incisions healing well  Diagnostic Tests: REPORT OF SURGICAL PATHOLOGY FINAL DIAGNOSIS Diagnosis Lung, wedge biopsy/resection, left upper lobe - BENIGN LUNG WITH FIBROELASTIC PLEURAL SCAR, CALCIFICATIONS AND OSSIFICATIONS,  SEE COMMENT. - NEGATIVE FOR EPITHELIAL DYSPLASIA OR MALIGNANCY. Microscopic Comment Numerous representative sections of lung demonstrate pleural/subpleural fibroelastotic scar in which there are incidental foci of carcinoid tumorlets identified (positive cytokeratin AE1/3, synaptophysin, chromogranin and negative EMA, smooth muscle actin). The remaining tissue demonstrates patchy and coalescing areas of interstitial calcifications associated with ossifications and prominent interstitial myofibroblastic tissue response. There is no significant inflammation associated with calcifications or ossifications. Overall, the morphologic features are consistent with so-called metastatic  calcifications associated with prominent interstitial ossifications and myofibroblastic tissue response. Given the radiographic findings, the clinicopathologic features are consistent with so called diffuse interstitial ossification. The case was discussed with Dr Roxan Hockey on 09/02/2014. (CRR:ecj 09/05/2014) Kimberly RUND DO Pathologist, [employer]  Impression: 60 year old woman with diffuse pulmonary infiltrates which turned out to be metastatic calcification with diffuse interstitial ossification. Her parathyroid hormone is elevated and she has hypocalcemia. She has an appointment with Dr. Moshe Cipro later this week.  From a surgical standpoint she's doing well. She does still have some incisional discomfort, which is not surprising. She is not requiring any narcotics. Her activities are unrestricted, but she was cautioned to builds into them gradually. She may drive.  Plan:  Follow-up with Dr. Moshe Cipro.  I'll be happy to see her back any time if I can be of any further assistance with her care.

## 2014-10-06 ENCOUNTER — Encounter (HOSPITAL_COMMUNITY): Payer: Self-pay | Admitting: Thoracic Surgery (Cardiothoracic Vascular Surgery)

## 2014-10-14 LAB — AFB CULTURE WITH SMEAR (NOT AT ARMC)
Acid Fast Smear: NONE SEEN
Acid Fast Smear: NONE SEEN

## 2015-02-02 ENCOUNTER — Encounter: Payer: Self-pay | Admitting: Gastroenterology

## 2015-03-20 ENCOUNTER — Other Ambulatory Visit: Payer: Self-pay

## 2015-06-08 DIAGNOSIS — Z736 Limitation of activities due to disability: Secondary | ICD-10-CM

## 2015-06-21 ENCOUNTER — Telehealth: Payer: Self-pay | Admitting: Internal Medicine

## 2015-06-21 NOTE — Telephone Encounter (Signed)
OK by me 

## 2015-06-21 NOTE — Telephone Encounter (Signed)
Fine with me

## 2015-06-21 NOTE — Telephone Encounter (Signed)
Spoke with pt.  C/o increased cough x 2-3 months.  BQ's first available is in Nov.  Pt scheduled to see TP on Jun 26, 2015 at 3:30 pm.  Pt is aware TP will arrange f/u with BQ after this appt.  Pt verbalized understanding, is in agreement with plan, and voiced no further questions or concerns at this time.

## 2015-06-21 NOTE — Telephone Encounter (Signed)
Called spoke with pt. She is requesting to see Dr. Lake Bells for a cough going on x 15 years. She saw MW 07/04/14 and wants to change doctors. Please advise MW and Dr. Lake Bells thanks

## 2015-06-26 ENCOUNTER — Ambulatory Visit (INDEPENDENT_AMBULATORY_CARE_PROVIDER_SITE_OTHER): Payer: BLUE CROSS/BLUE SHIELD | Admitting: Adult Health

## 2015-06-26 ENCOUNTER — Encounter: Payer: Self-pay | Admitting: Adult Health

## 2015-06-26 VITALS — BP 108/70 | HR 67 | Temp 98.4°F | Ht 66.0 in | Wt 137.2 lb

## 2015-06-26 DIAGNOSIS — R918 Other nonspecific abnormal finding of lung field: Secondary | ICD-10-CM | POA: Diagnosis not present

## 2015-06-26 DIAGNOSIS — R05 Cough: Secondary | ICD-10-CM

## 2015-06-26 DIAGNOSIS — R059 Cough, unspecified: Secondary | ICD-10-CM

## 2015-06-26 NOTE — Progress Notes (Signed)
Subjective:    Patient ID: Kimberly [name], female    DOB: [birth year], 59 y.o.   MRN: [id number]  HPI 59 yo never smoker female with ESRD (IgA nephropathy)  on peritoneal dialysis . Previous renal transplant in 2011 that failed.  Seen for pulmonary consult in Oct 2015 for multiple pulmonary nodule with largest left apex measuring 4.1cm .   Past med hx. >DM, HTN, CVA  06/26/2015 Follow up : Chronic cough  Seen for pulmonary consult in Oct 2015 for lung mass in left apex measuring 4.1cm  Underwent FOB with path/cx were unrevealing . She was referred to [facility] and underwent lung bx on 09/01/14 . 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 Has seen allergist and pulmonary in past . Tried cough meds, inhaler and allergy shots with no help. Worse with drinking and eating.  And At bedtime   Very aggravating , effects all her life.  Coughs to point she vomits. Takes reglan once daily  No GERD .  No known hx of macrodantin, amiodarone , or ACE inhibitor use.  Says dx w/ chronic bronchitis as child.  Cough has been going on for greater than 10 years.   From [place], never lived in [place].  Never smoker. Retired , office work.  No unusal hobbies . No bird /chicken exposure .  Has dog.   [employer] , does not want to use prilosec and a lot of other meds are have lot of adverse side effects.    Review of Systems  Constitutional:   No  weight loss, night sweats,  Fevers, chills, fatigue, or  lassitude.  HEENT:   No headaches,  Difficulty swallowing,  Tooth/dental problems, or  Sore throat,                No sneezing, itching, ear ache,  +nasal congestion, post nasal drip,   CV:  No chest pain,  Orthopnea, PND, swelling in lower extremities, anasarca, dizziness, palpitations, syncope.   GI  No heartburn, indigestion, abdominal pain,  nausea, vomiting, diarrhea, change in bowel habits, loss of appetite, bloody stools.   Resp:   No chest wall deformity  Skin: no rash or lesions.  GU: no dysuria, change in color of urine, no urgency or frequency.  No flank pain, no hematuria   MS:  No joint pain or swelling.  No decreased range of motion.  No back pain.  Psych:  No change in mood or affect. No depression or anxiety.  No memory loss.          Objective:   Physical Exam  GEN: A/Ox3; pleasant , NAD, well nourished   HEENT:  /AT,  EACs-clear, TMs-wnl, NOSE-clear, THROAT-clear, no lesions, no postnasal drip or exudate noted.   NECK:  Supple w/ fair ROM; no JVD; normal carotid impulses w/o bruits; no thyromegaly or nodules palpated; no lymphadenopathy.  RESP  Clear  P & A; w/o, wheezes/ rales/ or rhonchi.no accessory muscle use, no dullness to percussion  CARD:  RRR, no m/r/g  , no peripheral edema, pulses intact, no cyanosis or clubbing.  GI:   Soft & nt; nml bowel sounds; no organomegaly or masses detected.  Musco: Warm bil, no deformities or joint swelling noted.   Neuro: alert, no focal deficits noted.    Skin: Warm, no lesions or rashes  Assessment & Plan:

## 2015-06-26 NOTE — Assessment & Plan Note (Signed)
Chronic cough ongoing for 10-15 years ? Etiology  Pt reports she has had various treatments for cough w/ inhalers , allergy meds and cough suppressants.  Does have bilateral lung nodules ? Etiology . She has undergone FOB and VATS that has been non diagnostic. Unclear if this has anything to do with her cough.  For now will need to control for possible triggers with AR and GERD  She is hesitant to take new meds as for potential side effects. .  She will need PFT . If not better on return , consider CT sinus   Plan  Begin Zantac 150mg  Twice daily  .  Begin Zyrtec 10mg  At bedtime   Use sips of water to soothe throat and avoid throat clearing .  Delsym 2 tsp Twice daily  As needed  Cough.  Tesslaon Three times a day  For cough .  follow up Dr. Melvyn Novas  In 4 weeks with chest xray and PFT

## 2015-06-26 NOTE — Patient Instructions (Addendum)
Begin Zantac 150mg  Twice daily  .  Begin Zyrtec 10mg  At bedtime   Use sips of water to soothe throat and avoid throat clearing .  Delsym 2 tsp Twice daily  As needed  Cough.  Tesslaon Three times a day  For cough .  follow up Dr. Melvyn Novas  In 4 weeks with chest xray and PFT

## 2015-06-26 NOTE — Assessment & Plan Note (Signed)
Bilateral pulmonary infiltrates , FOB w/ non diagnostic findings.  Open lung bx w/ no malignancy noted.  Cont to follow  Check cxr on return ov

## 2015-06-27 ENCOUNTER — Telehealth: Payer: Self-pay

## 2015-06-27 NOTE — Telephone Encounter (Signed)
Spoke with pt, states that she does not wish to push her appt out further to see BQ, and prefers to keep her rov with MW.  appt has not been changed, and TP aware.  Nothing further needed.

## 2015-06-27 NOTE — Telephone Encounter (Signed)
-----   Message from Melvenia Needles, NP sent at 06/27/2015  8:29 AM EDT ----- Estill Bamberg , please set up with  Dr. Lake Bells , i accidentally put on Dr. Melvyn Novas  Schedule  Can you call her   tp

## 2015-06-27 NOTE — Progress Notes (Signed)
Chart and office note reviewed in detail along with available xrays/ labs > note her bx is c/w metastatic calcinosis from CRI and does not need further w/u and very very unlikely to be the cause of her cough.  Agree with tentative plan for rx but if not willing undergo the standard cough guidelines which will include intense rx for gerd (since she's coughing so hard she vomits she's clearly refluxing) then no need to return here > consider refer to [facility] pulmonary/ent)

## 2015-07-05 ENCOUNTER — Other Ambulatory Visit: Payer: Self-pay | Admitting: Family Medicine

## 2015-07-05 ENCOUNTER — Ambulatory Visit
Admission: RE | Admit: 2015-07-05 | Discharge: 2015-07-05 | Disposition: A | Discharge status: Home / Self Care | Payer: BLUE CROSS/BLUE SHIELD | Source: Ambulatory Visit | Attending: Family Medicine | Admitting: Family Medicine

## 2015-07-05 DIAGNOSIS — R0602 Shortness of breath: Secondary | ICD-10-CM

## 2015-07-05 DIAGNOSIS — R079 Chest pain, unspecified: Secondary | ICD-10-CM

## 2015-07-05 MED ORDER — IOPAMIDOL (ISOVUE-370) INJECTION 76%: 80.0000 mL | Freq: Once | INTRAVENOUS | Status: DC | PRN

## 2015-08-04 ENCOUNTER — Observation Stay (HOSPITAL_COMMUNITY)
Admission: EM | Admit: 2015-08-04 | Discharge: 2015-08-05 | Disposition: A | Discharge status: Home / Self Care | Payer: BLUE CROSS/BLUE SHIELD | Attending: Internal Medicine | Admitting: Internal Medicine

## 2015-08-04 ENCOUNTER — Emergency Department (HOSPITAL_COMMUNITY): Payer: BLUE CROSS/BLUE SHIELD

## 2015-08-04 ENCOUNTER — Encounter (HOSPITAL_COMMUNITY): Payer: Self-pay | Admitting: Cardiology

## 2015-08-04 DIAGNOSIS — Z7982 Long term (current) use of aspirin: Secondary | ICD-10-CM | POA: Diagnosis not present

## 2015-08-04 DIAGNOSIS — I309 Acute pericarditis, unspecified: Secondary | ICD-10-CM | POA: Diagnosis present

## 2015-08-04 DIAGNOSIS — R011 Cardiac murmur, unspecified: Secondary | ICD-10-CM | POA: Insufficient documentation

## 2015-08-04 DIAGNOSIS — Z794 Long term (current) use of insulin: Secondary | ICD-10-CM

## 2015-08-04 DIAGNOSIS — Z79899 Other long term (current) drug therapy: Secondary | ICD-10-CM | POA: Insufficient documentation

## 2015-08-04 DIAGNOSIS — Z8673 Personal history of transient ischemic attack (TIA), and cerebral infarction without residual deficits: Secondary | ICD-10-CM | POA: Diagnosis not present

## 2015-08-04 DIAGNOSIS — N186 End stage renal disease: Secondary | ICD-10-CM

## 2015-08-04 DIAGNOSIS — N079 Hereditary nephropathy, not elsewhere classified with unspecified morphologic lesions: Secondary | ICD-10-CM | POA: Diagnosis not present

## 2015-08-04 DIAGNOSIS — K219 Gastro-esophageal reflux disease without esophagitis: Secondary | ICD-10-CM | POA: Diagnosis not present

## 2015-08-04 DIAGNOSIS — I440 Atrioventricular block, first degree: Secondary | ICD-10-CM | POA: Insufficient documentation

## 2015-08-04 DIAGNOSIS — Z8619 Personal history of other infectious and parasitic diseases: Secondary | ICD-10-CM | POA: Diagnosis not present

## 2015-08-04 DIAGNOSIS — R0789 Other chest pain: Secondary | ICD-10-CM | POA: Diagnosis present

## 2015-08-04 DIAGNOSIS — R7989 Other specified abnormal findings of blood chemistry: Secondary | ICD-10-CM | POA: Diagnosis not present

## 2015-08-04 DIAGNOSIS — R05 Cough: Secondary | ICD-10-CM | POA: Diagnosis not present

## 2015-08-04 DIAGNOSIS — J45991 Cough variant asthma: Secondary | ICD-10-CM | POA: Diagnosis present

## 2015-08-04 DIAGNOSIS — I491 Atrial premature depolarization: Secondary | ICD-10-CM | POA: Diagnosis not present

## 2015-08-04 DIAGNOSIS — R079 Chest pain, unspecified: Secondary | ICD-10-CM | POA: Diagnosis not present

## 2015-08-04 DIAGNOSIS — N028 Recurrent and persistent hematuria with other morphologic changes: Secondary | ICD-10-CM | POA: Diagnosis present

## 2015-08-04 DIAGNOSIS — I10 Essential (primary) hypertension: Secondary | ICD-10-CM | POA: Insufficient documentation

## 2015-08-04 DIAGNOSIS — E039 Hypothyroidism, unspecified: Secondary | ICD-10-CM | POA: Insufficient documentation

## 2015-08-04 DIAGNOSIS — R059 Cough, unspecified: Secondary | ICD-10-CM

## 2015-08-04 DIAGNOSIS — R938 Abnormal findings on diagnostic imaging of other specified body structures: Secondary | ICD-10-CM

## 2015-08-04 DIAGNOSIS — D649 Anemia, unspecified: Secondary | ICD-10-CM | POA: Diagnosis not present

## 2015-08-04 DIAGNOSIS — E038 Other specified hypothyroidism: Secondary | ICD-10-CM | POA: Diagnosis not present

## 2015-08-04 DIAGNOSIS — R918 Other nonspecific abnormal finding of lung field: Secondary | ICD-10-CM | POA: Diagnosis present

## 2015-08-04 DIAGNOSIS — J45909 Unspecified asthma, uncomplicated: Secondary | ICD-10-CM | POA: Diagnosis present

## 2015-08-04 DIAGNOSIS — E118 Type 2 diabetes mellitus with unspecified complications: Secondary | ICD-10-CM | POA: Diagnosis not present

## 2015-08-04 DIAGNOSIS — R091 Pleurisy: Secondary | ICD-10-CM | POA: Diagnosis present

## 2015-08-04 DIAGNOSIS — R778 Other specified abnormalities of plasma proteins: Secondary | ICD-10-CM

## 2015-08-04 DIAGNOSIS — Z992 Dependence on renal dialysis: Secondary | ICD-10-CM

## 2015-08-04 LAB — COMPREHENSIVE METABOLIC PANEL WITH GFR
ALT: 16 U/L (ref 14–54)
AST: 15 U/L (ref 15–41)
Albumin: 1.8 g/dL — ABNORMAL LOW (ref 3.5–5.0)
Alkaline Phosphatase: 185 U/L — ABNORMAL HIGH (ref 38–126)
Anion gap: 10 (ref 5–15)
BUN: 61 mg/dL — ABNORMAL HIGH (ref 6–20)
CO2: 27 mmol/L (ref 22–32)
Calcium: 8.6 mg/dL — ABNORMAL LOW (ref 8.9–10.3)
Chloride: 98 mmol/L — ABNORMAL LOW (ref 101–111)
Creatinine, Ser: 12.46 mg/dL — ABNORMAL HIGH (ref 0.44–1.00)
GFR calc Af Amer: 3 mL/min — ABNORMAL LOW
GFR calc non Af Amer: 3 mL/min — ABNORMAL LOW
Glucose, Bld: 81 mg/dL (ref 65–99)
Potassium: 3 mmol/L — ABNORMAL LOW (ref 3.5–5.1)
Sodium: 135 mmol/L (ref 135–145)
Total Bilirubin: 0.2 mg/dL — ABNORMAL LOW (ref 0.3–1.2)
Total Protein: 5.1 g/dL — ABNORMAL LOW (ref 6.5–8.1)

## 2015-08-04 LAB — CBC WITH DIFFERENTIAL/PLATELET
Basophils Absolute: 0 10*3/uL (ref 0.0–0.1)
Basophils Relative: 1 %
Eosinophils Absolute: 0.2 10*3/uL (ref 0.0–0.7)
Eosinophils Relative: 4 %
HCT: 31.4 % — ABNORMAL LOW (ref 36.0–46.0)
Hemoglobin: 10 g/dL — ABNORMAL LOW (ref 12.0–15.0)
Lymphocytes Relative: 11 %
Lymphs Abs: 0.4 10*3/uL — ABNORMAL LOW (ref 0.7–4.0)
MCH: 28.3 pg (ref 26.0–34.0)
MCHC: 31.8 g/dL (ref 30.0–36.0)
MCV: 89 fL (ref 78.0–100.0)
Monocytes Absolute: 0.4 10*3/uL (ref 0.1–1.0)
Monocytes Relative: 11 %
Neutro Abs: 2.9 10*3/uL (ref 1.7–7.7)
Neutrophils Relative %: 73 %
Platelets: 225 10*3/uL (ref 150–400)
RBC: 3.53 MIL/uL — ABNORMAL LOW (ref 3.87–5.11)
RDW: 13.2 % (ref 11.5–15.5)
WBC: 4 10*3/uL (ref 4.0–10.5)

## 2015-08-04 LAB — I-STAT TROPONIN, ED: Troponin i, poc: 0.21 ng/mL (ref 0.00–0.08)

## 2015-08-04 LAB — TROPONIN I: Troponin I: 0.24 ng/mL — ABNORMAL HIGH (ref <0.031)

## 2015-08-04 MED ORDER — ACETAMINOPHEN 325 MG PO TABS: 650.0000 mg | ORAL_TABLET | ORAL | Status: DC | PRN

## 2015-08-04 MED ORDER — CALCITRIOL 0.5 MCG PO CAPS: 0.5000 ug | ORAL_CAPSULE | ORAL | Status: DC

## 2015-08-04 MED ORDER — CINACALCET HCL 30 MG PO TABS
30.0000 mg | ORAL_TABLET | Freq: Every day | ORAL | Status: DC
  Filled 2015-08-04 (×2): qty 1

## 2015-08-04 MED ORDER — HEPARIN 1000 UNIT/ML FOR PERITONEAL DIALYSIS
2500.0000 [IU] | INTRAMUSCULAR | Status: DC | PRN
  Filled 2015-08-04 (×3): qty 2.5

## 2015-08-04 MED ORDER — LANTHANUM CARBONATE 500 MG PO CHEW
1000.0000 mg | CHEWABLE_TABLET | Freq: Three times a day (TID) | ORAL | Status: DC
  Administered 2015-08-05: 1000 mg via ORAL
  Filled 2015-08-04 (×2): qty 2

## 2015-08-04 MED ORDER — METOCLOPRAMIDE HCL 10 MG PO TABS: 5.0000 mg | ORAL_TABLET | Freq: Every day | ORAL | Status: DC

## 2015-08-04 MED ORDER — METOCLOPRAMIDE HCL 5 MG PO TABS
5.0000 mg | ORAL_TABLET | Freq: Every day | ORAL | Status: DC
  Administered 2015-08-05: 5 mg via ORAL
  Filled 2015-08-04: qty 1

## 2015-08-04 MED ORDER — LEVOTHYROXINE SODIUM 50 MCG PO TABS
50.0000 ug | ORAL_TABLET | Freq: Every day | ORAL | Status: DC
  Administered 2015-08-05: 50 ug via ORAL
  Filled 2015-08-04: qty 1

## 2015-08-04 MED ORDER — ONDANSETRON HCL 4 MG/2ML IJ SOLN: 4.0000 mg | Freq: Four times a day (QID) | INTRAMUSCULAR | Status: DC | PRN

## 2015-08-04 MED ORDER — DICLOFENAC SODIUM 1 % TD GEL
2.0000 g | Freq: Four times a day (QID) | TRANSDERMAL | Status: DC
  Administered 2015-08-04 – 2015-08-05 (×3): 2 g via TOPICAL
  Filled 2015-08-04: qty 100

## 2015-08-04 MED ORDER — DELFLEX-LC/1.5% DEXTROSE 346 MOSM/L IP SOLN: INTRAPERITONEAL | Status: DC

## 2015-08-04 MED ORDER — DIAZEPAM 5 MG PO TABS: 5.0000 mg | ORAL_TABLET | Freq: Four times a day (QID) | ORAL | Status: DC | PRN

## 2015-08-04 MED ORDER — MIDODRINE HCL 5 MG PO TABS: 10.0000 mg | ORAL_TABLET | Freq: Two times a day (BID) | ORAL | Status: DC

## 2015-08-04 MED ORDER — GENTAMICIN SULFATE 0.1 % EX CREA
1.0000 "application " | TOPICAL_CREAM | Freq: Every day | CUTANEOUS | Status: DC
  Administered 2015-08-05: 1 via TOPICAL
  Filled 2015-08-04: qty 15

## 2015-08-04 MED ORDER — HEPARIN SODIUM (PORCINE) 5000 UNIT/ML IJ SOLN
5000.0000 [IU] | Freq: Three times a day (TID) | INTRAMUSCULAR | Status: DC
  Filled 2015-08-04: qty 1

## 2015-08-04 MED ORDER — VITAMIN D3 25 MCG (1000 UNIT) PO TABS
3000.0000 [IU] | ORAL_TABLET | Freq: Every day | ORAL | Status: DC
  Administered 2015-08-05: 3000 [IU] via ORAL
  Filled 2015-08-04 (×2): qty 3

## 2015-08-04 MED ORDER — ASPIRIN EC 81 MG PO TBEC
81.0000 mg | DELAYED_RELEASE_TABLET | Freq: Every day | ORAL | Status: DC
  Administered 2015-08-05: 81 mg via ORAL
  Filled 2015-08-04: qty 1

## 2015-08-04 MED ORDER — FERROUS FUMARATE 325 (106 FE) MG PO TABS
1.0000 | ORAL_TABLET | Freq: Every day | ORAL | Status: DC
  Administered 2015-08-05: 106 mg via ORAL
  Filled 2015-08-04: qty 1

## 2015-08-04 MED ORDER — DELFLEX-LC/1.5% DEXTROSE 346 MOSM/L IP SOLN
INTRAPERITONEAL | Status: DC
Start: 2015-08-04 — End: 2015-08-05
  Administered 2015-08-04 (×2): 5000 mL via INTRAPERITONEAL

## 2015-08-04 NOTE — Consult Note (Addendum)
Reason for Consult: Continuity of ESRD care Referring Physician: Debbe Odea M.D. [facility])  HPI: [name] with past medical history significant for end-stage renal disease on peritoneal dialysis status post failure of renal allograft 1-1/2 years ago. Presents with substernal chest pain that feels like a stabbing sensation radiating towards her back and reproducible by deep breathing/coughing that has worsened over the past 3-4 days along with palpitations. Apparently, she was treated for bronchitis with 2 rounds of azithromycin after she presented to her primary care provider a month ago with a sensation of "an elephant sitting on my chest" and consequent workup found her to have an unremarkable EKG and features consistent with bronchitis. She reports a chronic cough and in the past has undergone VATS for cavitary lesion that was culture negative. She denies any nausea, vomiting, fever or chills and reports intermittent sputum production particularly in the evenings. She denies any obvious diaphoresis.  PD prescription: 7 days a week, estimated dry weight 54.5 kg, 5 exchanges, 2 L fill volume, dwell time 90 minutes, last fill volume 2 L, 1 daytime exchange with 2 L fill volume. Erythropoietin and Venofer per algorithm. Dianeal solution based on blood pressure/weight.  Past Medical History  Diagnosis Date  . Kidney transplanted 01/2010    [facility], has had some rejection  . Hypothyroidism   . Anemia   . GERD (gastroesophageal reflux disease)   . Immunosuppression ([place])   . First degree AV block   . Premature atrial beat   . Premature ventricular beat   . Shingles 03/2011  . Hypertension     history; resolved on dialysis  . History of chronic cough   . History of hoarseness   . Heart murmur     child  . Stroke [facility]) October 2010, March 2013    cerebral aneurysm  . Diabetes mellitus     medication induced with  steroids  . IgA nephropathy     on peritoneal dialysis  . Renal  transplant failure and rejection 2012  . History of hiatal hernia     Past Surgical History  Procedure Laterality Date  . Kidney transplant Right 01/2010    [facility]  . Ureter revision  04/2011    [facility]  . Umbilical hernia repair  10/25/2009    open with mesh,  Dr [name]  . Capd insertion  08/25/2008    open, Dr [name]  . Capd removal  03/22/2010    Dr [name]  . Capd insertion N/A 12/14/2013    Procedure: LAPAROSCOPIC INSERTION CONTINUOUS AMBULATORY PERITONEAL DIALYSIS  (CAPD) CATHETER;  Surgeon: [name], MD;  Location: [place];  Service: General;  Laterality: N/A;  . Insertion of mesh N/A 12/14/2013    Procedure: INSERTION OF MESH;  Surgeon: [name], MD;  Location: [place];  Service: General;  Laterality: N/A;  . Laparoscopic lysis of adhesions N/A 12/14/2013    Procedure: LAPAROSCOPIC LYSIS OF ADHESIONS;  Surgeon: [name], MD;  Location: [place];  Service: General;  Laterality: N/A;  . Ventral hernia repair N/A 12/14/2013    Procedure: LAPAROSCOPIC VENTRAL HERNIA;  Surgeon: [name], MD;  Location: [place];  Service: General;  Laterality: N/A;  . Video bronchoscopy Bilateral 07/05/2014    Procedure: VIDEO BRONCHOSCOPY WITH FLUORO;  Surgeon: [name], MD;  Location: [facility];  Service: Cardiopulmonary;  Laterality: Bilateral;  . Eye surgery      rdial keratotomy  . Video assisted thoracoscopy (vats)/wedge resection Left 09/01/2014  Procedure: VIDEO ASSISTED THORACOSCOPY (VATS)/WEDGE RESECTION;  Surgeon: [name], MD;  Location: [facility];  Service: Thoracic;  Laterality: Left;    Family History  Problem Relation Age of Onset  . Coronary artery disease Mother   . Emphysema Mother     smoked  . Stroke Maternal Uncle   . Cancer Mother     ? ovarian CA    Social History:  reports that she has never smoked. She has never used smokeless tobacco. She reports that she does not drink alcohol or use illicit drugs.  Allergies:  Allergies   Allergen Reactions  . Dapsone Rash    Pain in feet  . Pregabalin Rash and Other (See Comments)    fever  . Sulfonamide Derivatives Rash  . Tramadol Rash    Rash on cheek    Medications:  Scheduled: . aspirin  81 mg Oral Daily  . cinacalcet  30 mg Oral Daily  . dialysis solution 1.5% low-MG/low-CA   Intraperitoneal Q24H  . dialysis solution 1.5% low-MG/low-CA   Intraperitoneal Q24H  . dialysis solution 1.5% low-MG/low-CA   Intraperitoneal Q24H  . diclofenac sodium  2 g Topical QID  . ferrous fumarate  1 tablet Oral Daily  . gentamicin cream  1 application Topical Daily  . heparin  5,000 Units Subcutaneous 3 times per day  . [START ON 08/05/2015] lanthanum  1,000 mg Oral TID WC  . [START ON 08/05/2015] levothyroxine  50 mcg Oral QAC breakfast  . [START ON 08/05/2015] metoCLOPramide  5 mg Oral Daily  . Vitamin D3  3,000 Units Oral Daily    BMP Latest Ref Rng 08/04/2015 09/05/2014 09/03/2014  Glucose 65 - 99 mg/dL 81 - 117(H)  BUN 6 - 20 mg/dL 61(H) - 39(H)  Creatinine 0.44 - 1.00 mg/dL 12.46(H) - 7.75(H)  Sodium 135 - 145 mmol/L 135 - 134(L)  Potassium 3.5 - 5.1 mmol/L 3.0(L) - 3.7  Chloride 101 - 111 mmol/L 98(L) - 96  CO2 22 - 32 mmol/L 27 - 24  Calcium 8.9 - 10.3 mg/dL 8.6(L) 7.7(L) 8.0(L)    CBC Latest Ref Rng 08/04/2015 09/03/2014 09/02/2014  WBC 4.0 - 10.5 K/uL 4.0 9.4 8.7  Hemoglobin 12.0 - 15.0 g/dL 10.0(L) 9.6(L) 6.1(LL)  Hematocrit 36.0 - 46.0 % 31.4(L) 31.3(L) 21.0(L)  Platelets 150 - 400 K/uL 225 394 368      Dg Chest 2 View  08/04/2015  CLINICAL DATA:  Pt is coming from PCP with complaints of Chest pain for a month. Patient has Hx of Chronic cough. Pt reports having cough a month ago that cleared up with antibiotics. Patient's pain is 2/10, sharp, stabbing in nature that increases with deep breathing and coughing. Patient's cough returned after treatment. EXAM: CHEST  2 VIEW COMPARISON:  10/04/2014 FINDINGS: Cardiac silhouette is mildly enlarged. No  mediastinal or hilar masses convincing adenopathy. Surgical anastomosis staples lie at the left apex with some subtle associated opacity, stable. There are streaky opacities in the lung bases increased from the prior chest radiograph. This could reflect acute bronchitis on chronic bronchitic change. There is no consolidation to suggest pneumonia. There is no pulmonary edema. No pleural effusion pneumothorax. Lungs are hyperexpanded. Bony thorax is grossly intact. IMPRESSION: 1. Increased bronchial markings in the lower lungs when compared to the prior chest radiograph. This suggests acute on chronic bronchitis in the proper clinical setting. 2. No evidence of pneumonia.  No pulmonary edema. Electronically Signed   By: Lajean Manes M.D.   On: 08/04/2015  15:25    Review of Systems  Constitutional: Positive for malaise/fatigue. Negative for fever and chills.  HENT: Negative.   Eyes: Negative.   Respiratory: Positive for cough and sputum production. Negative for shortness of breath.        Chronic cough for several years-worked up and without exact etiology. Occasional production of sputum without hemoptysis.  Cardiovascular: Positive for chest pain and palpitations. Negative for orthopnea, claudication and leg swelling.  Gastrointestinal: Negative.   Genitourinary: Negative.   Musculoskeletal: Negative.   Skin: Negative.   Neurological: Negative.   Endo/Heme/Allergies: Negative.   Psychiatric/Behavioral: Negative.    Blood pressure 116/82, pulse 64, temperature 98.5 F (36.9 C), temperature source Oral, resp. rate 15, weight 62.143 kg (137 lb), SpO2 96 %. Physical Exam  Nursing note and vitals reviewed. Constitutional: She is oriented to person, place, and time. She appears well-developed and well-nourished. No distress.  HENT:  Head: Normocephalic and atraumatic.  Nose: Nose normal.  Mouth/Throat: Oropharynx is clear and moist.  Eyes: EOM are normal. Pupils are equal, round, and reactive to  light. No scleral icterus.  Neck: Normal range of motion. Neck supple. No JVD present.  Cardiovascular: Normal rate and regular rhythm.  Exam reveals no friction rub.   No murmur heard. Respiratory: Effort normal and breath sounds normal. No respiratory distress. She has no wheezes. She has no rales.  GI: Soft. She exhibits distension. There is no tenderness. There is no rebound and no guarding.  Musculoskeletal: Normal range of motion. She exhibits no edema.  Neurological: She is alert and oriented to person, place, and time.  Skin: Skin is warm and dry. No erythema.  Psychiatric: She has a normal mood and affect.    Assessment/Plan: 1. Chest pain: Ongoing cycling of cardiac enzymes-initial troponin I elevated at 0.21 and EKG unremarkable for acute coronary syndrome. Plans in place for ruling out acute coronary syndrome. 2. End-stage renal disease: We'll resume her peritoneal dialysis at her usual outpatient prescription based on data available on [facility]. The patient reports that during her first fill at night, she usually drains it immediately after filling almost as if bypassing the first fill- she reports that this is how she has done it and does not have a clear reason why. She denies any cloudy effluent. 3. Anemia: Borderline hemoglobin noted, we'll give Aranesp to support hemoglobin. She denies any overt losses 4. Metabolic bone disease: Resume phosphorus binders as well as Sensipar for PTH suppression 5. Hypertension/volume status: Clinically, she appears to be close to euvolemic although checks in much higher than her dry weight-I suspect that this is discrepancy with the scales used. Peritoneal dialysis with 1.5% solution.  Kimberly [name] K. 08/04/2015, 6:05 PM

## 2015-08-04 NOTE — ED Notes (Signed)
Per EMS, Pt is coming from PCP with complaints of Chest pain for a month. Patient has Hx of Chronic cough. Pt reports having cough a month ago that cleared up with antibiotics. Patient's pain is 2/10, sharp, stabbing in nature that increases with deep breathing and coughing. Patient's cough returned after treatment. Vitals per EMS: 120/78, 66 HR, 95% on RA. Hx of Kidney Transplant in 2011.

## 2015-08-04 NOTE — H&P (Signed)
[facility] History and Physical                                                                                    Kimberly [name], is a 59 y.o. female  MRN: [id number]   DOB - 1955-09-25  Admit Date - 08/04/2015  Outpatient Primary MD for the patient is Reginia Naas, MD  Referring MD: Michaell Cowing / ER  Consulting M.D: Posey Pronto / Nephrology  With History of -  Past Medical History  Diagnosis Date  . Kidney transplanted 01/2010    [facility], has had some rejection  . Hypothyroidism   . Anemia   . GERD (gastroesophageal reflux disease)   . Immunosuppression ([place])   . First degree AV block   . Premature atrial beat   . Premature ventricular beat   . Shingles 03/2011  . Hypertension     history; resolved on dialysis  . History of chronic cough   . History of hoarseness   . Heart murmur     child  . Stroke [facility]) October 2010, March 2013    cerebral aneurysm  . Diabetes mellitus     medication induced with  steroids  . IgA nephropathy     on peritoneal dialysis  . Renal transplant failure and rejection 2012  . History of hiatal hernia       Past Surgical History  Procedure Laterality Date  . Kidney transplant Right 01/2010    [facility]  . Ureter revision  04/2011    [facility]  . Umbilical hernia repair  10/25/2009    open with mesh,  Dr [name]  . Capd insertion  08/25/2008    open, Dr [name]  . Capd removal  03/22/2010    Dr [name]  . Capd insertion N/A 12/14/2013    Procedure: LAPAROSCOPIC INSERTION CONTINUOUS AMBULATORY PERITONEAL DIALYSIS  (CAPD) CATHETER;  Surgeon: [name], MD;  Location: [place];  Service: General;  Laterality: N/A;  . Insertion of mesh N/A 12/14/2013    Procedure: INSERTION OF MESH;  Surgeon: [name], MD;  Location: [place];  Service: General;  Laterality: N/A;  . Laparoscopic lysis of adhesions N/A 12/14/2013    Procedure: LAPAROSCOPIC LYSIS OF ADHESIONS;  Surgeon: [name], MD;  Location: [place];  Service: General;   Laterality: N/A;  . Ventral hernia repair N/A 12/14/2013    Procedure: LAPAROSCOPIC VENTRAL HERNIA;  Surgeon: [name], MD;  Location: [place];  Service: General;  Laterality: N/A;  . Video bronchoscopy Bilateral 07/05/2014    Procedure: VIDEO BRONCHOSCOPY WITH FLUORO;  Surgeon: [name], MD;  Location: [facility];  Service: Cardiopulmonary;  Laterality: Bilateral;  . Eye surgery      rdial keratotomy  . Video assisted thoracoscopy (vats)/wedge resection Left 09/01/2014    Procedure: VIDEO ASSISTED THORACOSCOPY (VATS)/WEDGE RESECTION;  Surgeon: [name], MD;  Location: [place];  Service: Thoracic;  Laterality: Left;    in for   Chief Complaint  Patient presents with  . Pleurisy     HPI This is a 59 year old female patient chronic kidney disease on peritoneal dialysis since March 2015, failed renal transplant  2011, history of IgA nephropathy, hypothyroidism, anemia of chronic kidney disease, GERD, hypertension, history of chronic cough, history of VATS for cavitary lesion that was culture negative in December 2015. Patient presents with complaint of chest pain and palpitations. Patient reports that dating back to October 12 she began having chest pressure while laying down flat in the bed that she described as "elephant on her chest". This had no other associated symptoms in regards to shortness of breath diaphoresis nausea and dizziness. She did have fever of 100. She reports this chest pain lasted all day and would wax and waning quality and essentially lasted for about 2 weeks. On that same date she went to see her primary care physician who ordered an EKG and a CT of the chest she reports that she was told she had "inflammation" and was given a Z-Pak. She essentially improved although several days later she did have pain that radiated first to her right ventral left jaw but improved with use of heat. About 3-4 days ago she began having what she initially described as  "arrhythmia" which I later clarified as likely palpitations She reports that these symptoms lasted about 2-3 minutes. No other associated symptoms. Today she began having sharp left anterior chest discomfort like a knife. Again no other symptoms.  She was afebrile, BP 127/76, pulse 63 and respirations were 15 with room air saturations are 100%. EKG revealed sinus rhythm, ventricular rate 66 bpm, QTC 514 ms, no definitive ST or T-wave changes concerning for ischemia. In review of her previous CT scan done on 1012 there was evidence of nodular tree-in-bud densities in both upper lobes most likely inflammatory/small airway disease consistent with bronchitis. Ascites in the upper abdomen consistent with known history of peritoneal dialysis. Please see outpatient document. Chest x-ray this admission with increased bronchial markings in the lower lungs with compared to prior chest. Laboratory data with hyper with BUN is 61 creatinine 2.46. Alk phosphatase 185, albumin 1.8, troponin 0.21, WBC unremarkable except for hemoglobin 10 which is baseline for patient.   Review of Systems   In addition to the HPI above,  No Fever-chills, myalgias or other constitutional symptoms No Headache, changes with Vision or hearing, new weakness, tingling, numbness in any extremity, No problems swallowing food or Liquids, indigestion/reflux No Shortness of Breath, orthopnea or DOE No Abdominal pain, N/V; no melena or hematochezia, no dark tarry stools No dysuria, hematuria or flank pain No new skin rashes, lesions, masses or bruises, No new joints pains-aches No recent weight gain or loss No polyuria, polydypsia or polyphagia,  *A full 10 point Review of Systems was done, except as stated above, all other Review of Systems were negative.  Social History Social History  Substance Use Topics  . Smoking status: Never Smoker   . Smokeless tobacco: Never Used  . Alcohol Use: No    Resides at: Private residence  Lives  with: Spouse  Ambulatory status: Without assistive devices   Family History Family History  Problem Relation Age of Onset  . Coronary artery disease Mother   . Emphysema Mother     smoked  . Stroke Maternal Uncle   . Cancer Mother     ? ovarian CA     Prior to Admission medications   Medication Sig Start Date End Date Taking? Authorizing Provider  calcitRIOL (ROCALTROL) 0.5 MCG capsule Take 0.5 mcg by mouth once a week. Monday   Yes Historical Provider, MD  Cholecalciferol (VITAMIN D3) 3000 UNITS TABS Take 3,000  Units by mouth daily.   Yes Historical Provider, MD  cinacalcet (SENSIPAR) 30 MG tablet Take 30 mg by mouth daily.   Yes Historical Provider, MD  epoetin alfa (EPOGEN,PROCRIT) 09233 UNIT/ML injection Inject 10,000 Units into the skin every 7 (seven) days.   Yes Historical Provider, MD  ferrous fumarate (HEMOCYTE - 106 MG FE) 325 (106 FE) MG TABS tablet Take 1 tablet by mouth daily.    Yes Historical Provider, MD  lanthanum (FOSRENOL) 1000 MG chewable tablet Chew 1,000 mg by mouth 3 (three) times daily with meals.   Yes Historical Provider, MD  levothyroxine (SYNTHROID, LEVOTHROID) 50 MCG tablet Take 50 mcg by mouth daily before breakfast.   Yes Historical Provider, MD  metoCLOPramide (REGLAN) 5 MG tablet Take 5 mg by mouth daily.    Yes Historical Provider, MD  midodrine (PROAMATINE) 10 MG tablet Take 10 mg by mouth 2 (two) times daily.   Yes Historical Provider, MD  OVER THE COUNTER MEDICATION Take 2 capsules by mouth 3 (three) times daily. Juice plus capsules   Yes Historical Provider, MD  aspirin 81 MG tablet Take 81 mg by mouth daily.    Historical Provider, MD  diazepam (VALIUM) 5 MG tablet Take 5 mg by mouth every 6 (six) hours as needed for anxiety.    Historical Provider, MD  gentamicin cream (GARAMYCIN) 0.1 % Apply 1 application topically 3 (three) times daily.  12/21/13   Historical Provider, MD  Methoxy PEG-Epoetin Beta (MIRCERA) 75 MCG/0.3ML SOSY Inject as directed.     Historical Provider, MD    Allergies  Allergen Reactions  . Dapsone Rash    Pain in feet  . Pregabalin Rash and Other (See Comments)    fever  . Sulfonamide Derivatives Rash  . Tramadol Rash    Rash on cheek    Physical Exam  Vitals  Blood pressure 107/77, pulse 68, temperature 98.5 F (36.9 C), temperature source Oral, resp. rate 24, weight 137 lb (62.143 kg), SpO2 93 %.   General:  In no acute distress, appears healthy and well nourished  Psych:  Normal affect, Denies Suicidal or Homicidal ideations, Awake Alert, Oriented X 3. Speech and thought patterns are clear and appropriate, no apparent short term memory deficits  Neuro:   No focal neurological deficits, CN II through XII intact, Strength 5/5 all 4 extremities, Sensation intact all 4 extremities.  ENT:  Ears and Eyes appear Normal, Conjunctivae clear, PER. Moist oral mucosa without erythema or exudates.  Neck:  Supple, No lymphadenopathy appreciated  Respiratory:  Symmetrical chest wall movement, Good air movement bilaterally, CTAB. Room Air-of note left anterior chest wall focally tender to palpation  Cardiac:  RRR, No Murmurs, no LE edema noted, no JVD, No carotid bruits, peripheral pulses palpable at 2+  Abdomen:  Positive bowel sounds, Soft, Non tender, Non distended,  No masses appreciated, no obvious hepatosplenomegaly; PD insertion site catheter unremarkable  Skin:  No Cyanosis, Normal Skin Turgor, No Skin Rash or Bruise.  Extremities: Symmetrical without obvious trauma or injury,  no effusions.  Data Review  CBC  Recent Labs Lab 08/04/15 1502  WBC 4.0  HGB 10.0*  HCT 31.4*  PLT 225  MCV 89.0  MCH 28.3  MCHC 31.8  RDW 13.2  LYMPHSABS 0.4*  MONOABS 0.4  EOSABS 0.2  BASOSABS 0.0    Chemistries   Recent Labs Lab 08/04/15 1502  NA 135  K 3.0*  CL 98*  CO2 27  GLUCOSE 81  BUN 61*  CREATININE 12.46*  CALCIUM 8.6*  AST 15  ALT 16  ALKPHOS 185*  BILITOT 0.2*    estimated  creatinine clearance is 4.6 mL/min (by C-G formula based on Cr of 12.46).  No results for input(s): TSH, T4TOTAL, T3FREE, THYROIDAB in the last 72 hours.  Invalid input(s): FREET3  Coagulation profile No results for input(s): INR, PROTIME in the last 168 hours.  No results for input(s): DDIMER in the last 72 hours.  Cardiac Enzymes No results for input(s): CKMB, TROPONINI, MYOGLOBIN in the last 168 hours.  Invalid input(s): CK  Invalid input(s): POCBNP  Urinalysis    Component Value Date/Time   COLORURINE RED BIOCHEMICALS MAY BE AFFECTED BY COLOR* 01/03/2011 2311   APPEARANCEUR TURBID* 01/03/2011 2311   LABSPEC 1.013 01/03/2011 2311   PHURINE 6.0 01/03/2011 2311   GLUCOSEU 100* 01/03/2011 2311   HGBUR LARGE* 01/03/2011 2311   BILIRUBINUR MODERATE* 01/03/2011 2311   KETONESUR 15* 01/03/2011 2311   PROTEINUR >300* 01/03/2011 2311   UROBILINOGEN 0.2 01/03/2011 2311   NITRITE POSITIVE* 01/03/2011 2311   LEUKOCYTESUR LARGE* 01/03/2011 2311    Imaging results:   Dg Chest 2 View  08/04/2015  CLINICAL DATA:  Pt is coming from PCP with complaints of Chest pain for a month. Patient has Hx of Chronic cough. Pt reports having cough a month ago that cleared up with antibiotics. Patient's pain is 2/10, sharp, stabbing in nature that increases with deep breathing and coughing. Patient's cough returned after treatment. EXAM: CHEST  2 VIEW COMPARISON:  10/04/2014 FINDINGS: Cardiac silhouette is mildly enlarged. No mediastinal or hilar masses convincing adenopathy. Surgical anastomosis staples lie at the left apex with some subtle associated opacity, stable. There are streaky opacities in the lung bases increased from the prior chest radiograph. This could reflect acute bronchitis on chronic bronchitic change. There is no consolidation to suggest pneumonia. There is no pulmonary edema. No pleural effusion pneumothorax. Lungs are hyperexpanded. Bony thorax is grossly intact. IMPRESSION: 1.  Increased bronchial markings in the lower lungs when compared to the prior chest radiograph. This suggests acute on chronic bronchitis in the proper clinical setting. 2. No evidence of pneumonia.  No pulmonary edema. Electronically Signed   By: Lajean Manes M.D.   On: 08/04/2015 15:25     EKG: (Independently reviewed) sinus rhythm ventricular rate 66 bpm, QTC 514 ms, no ST segment changes and EKG essentially unchanged from previous EKG from December 2015.   Assessment & Plan  Principal Problem:   Atypical chest pain -Admit observational status to telemetry -Chest pain more pleuritic in nature -EKG nonischemic nor does it show evidence concerning for pericarditis; no increase in chest pain when seated forward -Initial troponin mildly elevated at 0.21-previous troponins have been normal but these were obtained in 2010 prior to patient having renal failure therefore current elevated troponin could be related to her chronic kidney disease nonetheless we will cycle troponin -Check echocardiogram; would only pursue stress Myoview or cardiac consultation if regional wall motion abnormalities on echo or troponin truly becomes definitively positive  Active Problems:   LUL cavitary lesion post VATS 2015-cx neg/persistent chronic cough -Patient has chronic cough for several years which has worsened over the past 6 months and as noted above had an episode of bronchitis one month prior treated with Zithromax -Last seen by Dr. Shyrl Numbers in the pulmonary office 8 days before most recent treatment for bronchitis; pulmonary biopsy was done for a pulmonary nodule- consistent with metastatic calcinosis from CRI and does not  need further workup and very very unlikely to be the cause of her cough -it was suggested severe GERD contributing although patient reports PPIs have been unhelpful in the past -Attending physician reviewed most recent CT scan from 10/12 with current pulmonologist on call who will come by and  reevaluate the the patient this hospitalization    HTN (hypertension) -No longer requiring any hypertensive agents and is actually on midodrine for apparent autonomic dysfunction and setting of dialysis    CKD (chronic kidney disease) stage V requiring chronic dialysis (HCC)/  IgA nephropathy -Has been on peritoneal dialysis since March 2015 and cycles nightly -Nephrology notified of need for consultation    DM type 2 causing complication ([place]) -No longer on medication -Last hemoglobin A1c 5.1 in 2014 therefore no longer carries a diagnosis of diabetes    Hypothyroidism -Continue Synthroid    ANEMIA -Hemoglobin stable and at baseline    Asthma? -See above regarding chronic cough -Currently not wheezing - excellent air movement    GERD -Not on PPI prior to admission    DVT Prophylaxis: Subcutaneous heparin  Family Communication:  Husband at bedside  Code Status:  Full code  Condition:  Stable  Discharge disposition: Anticipate discharge back to home in the next 24-48 hours  Time spent in minutes : 60      ELLIS,ALLISON L. ANP on 08/04/2015 at 5:15 PM  Between 7am to 7pm - Pager - [phone]  After 7pm go to [email or web address] - password [email or web address]  And look for the night coverage person covering me after hours  [facility]  I have examined the patient, reviewed the chart, modified the above note and discussed the plan with Jamal Maes, NP. Suspect chest pain is musculoskeletal in relation to severe cough but due to underlying risk factors, will admit and ensure troponin are negative. Have ordered ECHO. Pulmonary eval for ongoing cough and recent abnormal chest CT revealing b/l upper lobe nodular densities. Discussed with on call pulmonologist Dr Lake Bells who has reviewed the CT- question need for bronchoscopy.  She states she has been treated with a PPI in the past without any improvement in the cough. At one point it was suspected that she may have asthma  but she is not sure if she still has this diagnosis. Suspect she can be discharged home after pulmonary eval tomorrow afternoon if work up negative.  Debbe Odea, MD Pager: [email or web address]

## 2015-08-04 NOTE — Consult Note (Signed)
PULMONARY / CRITICAL CARE MEDICINE   Name: Kimberly [name] MRN: [id number] DOB: 03/30/1956    ADMISSION DATE:  08/04/2015 CONSULTATION DATE:  08/04/15  REFERRING MD :  Debbe Odea [facility])  CHIEF COMPLAINT:  Chest pain, cough  INITIAL PRESENTATION: Kimberly [name] is a 59 y.o. female that presented to the ED with chest tightness and worsening cough.  She was to be evaluated by pulmonology for findings on CT chest that were concerning for atypical mycobacterium infection.  She, unfortunately, required admission to hospital before she could be evaluated by pulmonology outpatient.  BAL apparently performed in 06/2014 and was unremarkable at that time.  PMH significant for HYPERTENSION, CVA, DIABETES, IgA nephropathy/ cough x 15 years unwilling to take PPi's but with h/o cough so hard she vomits.  STUDIES:   CT chest (06/30/14): with multiple ill-defined nodular pulmonary infiltrates, possibly representative of atypical  infection including granulomatous disease vs metastatic disease  CTA chest (07/05/15): bilateral tree-in-bud nodular densities in b/l upper lobes, likely inflammatory small airway disease.  SIGNIFICANT EVENTS: 11/11: Admitted to hospital  HISTORY OF PRESENT ILLNESS:   Patient reports a 15 year history of cough.  She reports that the cough is typically dry but occasionally "wet" at night.  Phlegm is clear.  Denies hemoptysis.  She reports that cough improved moderately after her renal transplant in 2011 but has worsened again over the last year or so.  She notes that cough is persistent and lasts all day long.  Nothing makes it better. Talking makes it worse.  She does not notice that physical activity worsens cough.  Denies edema, weight loss or travel or exposure to TB that she knows of.  She endorses occasional chills.  She also reports low grade fevers to 99.86F since 11/2013 when she had her HD cath placed.  Patient never a smoker.  She is a retired Chief Strategy Officer and has  had no exposure to chemicals or heavy exposure to minerals like coal, etc that she knows of.  Family history of emphysema and CAD.     PAST MEDICAL HISTORY :   has a past medical history of Kidney transplanted (01/2010); Hypothyroidism; Anemia; GERD (gastroesophageal reflux disease); Immunosuppression ([place]); First degree AV block; Premature atrial beat; Premature ventricular beat; Shingles (03/2011); Hypertension; History of chronic cough; History of hoarseness; Heart murmur; Stroke [facility]) (October 2010, March 2013); Diabetes mellitus; IgA nephropathy; Renal transplant failure and rejection (2012); and History of hiatal hernia.  has past surgical history that includes Kidney transplant (Right, 01/2010); Ureter revision (04/2011); Umbilical hernia repair (10/25/2009); CAPD insertion (08/25/2008); CAPD removal (03/22/2010); CAPD insertion (N/A, 12/14/2013); Insertion of mesh (N/A, 12/14/2013); Laparoscopic lysis of adhesions (N/A, 12/14/2013); Ventral hernia repair (N/A, 12/14/2013); Video bronchoscopy (Bilateral, 07/05/2014); Eye surgery; and Video assisted thoracoscopy (vats)/wedge resection (Left, 09/01/2014). Prior to Admission medications   Medication Sig Start Date End Date Taking? Authorizing Provider  calcitRIOL (ROCALTROL) 0.5 MCG capsule Take 0.5 mcg by mouth once a week. Monday   Yes Historical Provider, MD  Cholecalciferol (VITAMIN D3) 3000 UNITS TABS Take 3,000 Units by mouth daily.   Yes Historical Provider, MD  cinacalcet (SENSIPAR) 30 MG tablet Take 30 mg by mouth daily.   Yes Historical Provider, MD  epoetin alfa (EPOGEN,PROCRIT) 13086 UNIT/ML injection Inject 10,000 Units into the skin every 7 (seven) days.   Yes Historical Provider, MD  ferrous fumarate (HEMOCYTE - 106 MG FE) 325 (106 FE) MG TABS tablet Take 1 tablet by mouth daily.    Yes Historical  Provider, MD  lanthanum (FOSRENOL) 1000 MG chewable tablet Chew 1,000 mg by mouth 3 (three) times daily with meals.   Yes Historical Provider, MD   levothyroxine (SYNTHROID, LEVOTHROID) 50 MCG tablet Take 50 mcg by mouth daily before breakfast.   Yes Historical Provider, MD  metoCLOPramide (REGLAN) 5 MG tablet Take 5 mg by mouth daily.    Yes Historical Provider, MD  midodrine (PROAMATINE) 10 MG tablet Take 10 mg by mouth 2 (two) times daily.   Yes Historical Provider, MD  OVER THE COUNTER MEDICATION Take 2 capsules by mouth 3 (three) times daily. Juice plus capsules   Yes Historical Provider, MD  aspirin 81 MG tablet Take 81 mg by mouth daily.    Historical Provider, MD  diazepam (VALIUM) 5 MG tablet Take 5 mg by mouth every 6 (six) hours as needed for anxiety.    Historical Provider, MD  gentamicin cream (GARAMYCIN) 0.1 % Apply 1 application topically 3 (three) times daily.  12/21/13   Historical Provider, MD  Methoxy PEG-Epoetin Beta (MIRCERA) 75 MCG/0.3ML SOSY Inject as directed.    Historical Provider, MD   Allergies  Allergen Reactions  . Dapsone Rash    Pain in feet  . Pregabalin Rash and Other (See Comments)    fever  . Sulfonamide Derivatives Rash  . Tramadol Rash    Rash on cheek    FAMILY HISTORY:  has no family status information on file.  SOCIAL HISTORY:  reports that she has never smoked. She has never used smokeless tobacco. She reports that she does not drink alcohol or use illicit drugs.  REVIEW OF SYSTEMS:  Per HPI  SUBJECTIVE:   VITAL SIGNS: Temp:  [98.5 F (36.9 C)] 98.5 F (36.9 C) (11/11 1843) Pulse Rate:  [63-72] 72 (11/11 1843) Resp:  [15-24] 18 (11/11 1843) BP: (107-127)/(76-86) 127/86 mmHg (11/11 1843) SpO2:  [93 %-100 %] 100 % (11/11 1843) Weight:  [137 lb (62.143 kg)-138 lb 3.2 oz (62.687 kg)] 138 lb 3.2 oz (62.687 kg) (11/11 1843)    PHYSICAL EXAMINATION: General:  Alert, awake, NAD, sitting up in bed eating dinner Neuro:  No focal deficits, follows commands HEENT:  [place]/AT, MMM Cardiovascular:  RRR, 2/6 blowing systolic murmur heard at the RSB Lungs:  Breathing well on room air, Air  movement fair, mild crackles appreciated in the LLL (does not resolve with cough), normal work of breathing, no accessory muscle use Abdomen:  Flat, soft, NT/ND Musculoskeletal:  WWP, no edema Skin:  In tact, no rashes  LABS:  CBC  Recent Labs Lab 08/04/15 1502  WBC 4.0  HGB 10.0*  HCT 31.4*  PLT 225   Coag's No results for input(s): APTT, INR in the last 168 hours. BMET  Recent Labs Lab 08/04/15 1502  NA 135  K 3.0*  CL 98*  CO2 27  BUN 61*  CREATININE 12.46*  GLUCOSE 81   Electrolytes  Recent Labs Lab 08/04/15 1502  CALCIUM 8.6*   Sepsis Markers No results for input(s): LATICACIDVEN, PROCALCITON, O2SATVEN in the last 168 hours. ABG No results for input(s): PHART, PCO2ART, PO2ART in the last 168 hours. Liver Enzymes  Recent Labs Lab 08/04/15 1502  AST 15  ALT 16  ALKPHOS 185*  BILITOT 0.2*  ALBUMIN 1.8*   Cardiac Enzymes No results for input(s): TROPONINI, PROBNP in the last 168 hours. Glucose No results for input(s): GLUCAP in the last 168 hours.  Imaging Dg Chest 2 View  08/04/2015  CLINICAL DATA:  Pt is coming  from PCP with complaints of Chest pain for a month. Patient has Hx of Chronic cough. Pt reports having cough a month ago that cleared up with antibiotics. Patient's pain is 2/10, sharp, stabbing in nature that increases with deep breathing and coughing. Patient's cough returned after treatment. EXAM: CHEST  2 VIEW COMPARISON:  10/04/2014 FINDINGS: Cardiac silhouette is mildly enlarged. No mediastinal or hilar masses convincing adenopathy. Surgical anastomosis staples lie at the left apex with some subtle associated opacity, stable. There are streaky opacities in the lung bases increased from the prior chest radiograph. This could reflect acute bronchitis on chronic bronchitic change. There is no consolidation to suggest pneumonia. There is no pulmonary edema. No pleural effusion pneumothorax. Lungs are hyperexpanded. Bony thorax is grossly  intact. IMPRESSION: 1. Increased bronchial markings in the lower lungs when compared to the prior chest radiograph. This suggests acute on chronic bronchitis in the proper clinical setting. 2. No evidence of pneumonia.  No pulmonary edema. Electronically Signed   By: Lajean Manes M.D.   On: 08/04/2015 15:25    ASSESSMENT / PLAN:  Chronic cough: Currently stable on room air.  No evidence of fluid overload on exam.  Left lung with mild crackles on exam.  No obvious exposures in HPI.  CXR 11/11 with increased bronchial marking in lower lungs.  No apparent pneumonia or pulmonary edema.  BAL 07/05/2014 unrevealing so if didn't have MAI then probably doesn't have it now.    The most common causes of chronic cough in immunocompetent adults include the following: upper airway cough syndrome (UACS), previously referred to as postnasal drip syndrome (PNDS), which is caused by variety of rhinosinus conditions; (2) asthma; (3) GERD; (4) chronic bronchitis from cigarette smoking or other inhaled environmental irritants; (5) nonasthmatic eosinophilic bronchitis; and (6) bronchiectasis.   These conditions, singly or in combination, have accounted for up to 94% of the causes of chronic cough in prospective studies.   Other conditions have constituted no >6% of the causes in prospective studies These have included bronchogenic carcinoma, chronic interstitial pneumonia, sarcoidosis, left ventricular failure, ACEI-induced cough, and aspiration from a condition associated with pharyngeal dysfunction.    Chronic cough is often simultaneously caused by more than one condition. A single cause has been found from 38 to 82% of the time, multiple causes from 18 to 62%. Multiply caused cough has been the result of three diseases up to 42% of the time.    Will discuss with pt committing to a plan long enough to see it through in terms of benefit and if not consider referral to [facility]    Christinia Gully, MD Pulmonary and  [place] [phone] After 5:30 PM or weekends, call [phone]

## 2015-08-04 NOTE — ED Provider Notes (Signed)
CSN: [id number]     Arrival date & time 08/04/15  1341 History   First MD Initiated Contact with Patient 08/04/15 1401     Chief Complaint  Patient presents with  . Pleurisy     (Consider location/radiation/quality/duration/timing/severity/associated sxs/prior Treatment) HPI Kimberly [name] is a 59 yo female with ESKD on peritoneal dialysis who presents to the Emergency Department for evaluation of chest pain. The pt states chest pain started this morning around 8 am when she was lying in bed. She describes the pain as constant, sharp and is worse with inspiration. Pt also endorses palpitations, lasting 2-3 minutes, intermittent which started 3-4 days ago. Pt reports taking 2 325-mg tab ASA today without significant relief. Endorses shortness of breath and chronic cough. Pt states her mom passed away from a heart attack in her late 37's and sister has an implanted pacemaker. The pt also states that about 1 month ago, she had "pressure-like" chest pain and was given z-pack from PCP for inflammation.   Past Medical History  Diagnosis Date  . Kidney transplanted 01/2010    [facility], has had some rejection  . Hypothyroidism   . Anemia   . GERD (gastroesophageal reflux disease)   . Immunosuppression ([place])   . First degree AV block   . Premature atrial beat   . Premature ventricular beat   . Shingles 03/2011  . Hypertension     history; resolved on dialysis  . History of chronic cough   . History of hoarseness   . Heart murmur     child  . Stroke [facility]) October 2010, March 2013    cerebral aneurysm  . Diabetes mellitus     medication induced with  steroids  . IgA nephropathy     on peritoneal dialysis  . Renal transplant failure and rejection 2012  . History of hiatal hernia    Past Surgical History  Procedure Laterality Date  . Kidney transplant Right 01/2010    [facility]  . Ureter revision  04/2011    [facility]  . Umbilical hernia repair  10/25/2009    open with mesh,  Dr [name]  . Capd  insertion  08/25/2008    open, Dr [name]  . Capd removal  03/22/2010    Dr [name]  . Capd insertion N/A 12/14/2013    Procedure: LAPAROSCOPIC INSERTION CONTINUOUS AMBULATORY PERITONEAL DIALYSIS  (CAPD) CATHETER;  Surgeon: [name], MD;  Location: [place];  Service: General;  Laterality: N/A;  . Insertion of mesh N/A 12/14/2013    Procedure: INSERTION OF MESH;  Surgeon: [name], MD;  Location: [place];  Service: General;  Laterality: N/A;  . Laparoscopic lysis of adhesions N/A 12/14/2013    Procedure: LAPAROSCOPIC LYSIS OF ADHESIONS;  Surgeon: [name], MD;  Location: [place];  Service: General;  Laterality: N/A;  . Ventral hernia repair N/A 12/14/2013    Procedure: LAPAROSCOPIC VENTRAL HERNIA;  Surgeon: [name], MD;  Location: [place];  Service: General;  Laterality: N/A;  . Video bronchoscopy Bilateral 07/05/2014    Procedure: VIDEO BRONCHOSCOPY WITH FLUORO;  Surgeon: [name], MD;  Location: [facility];  Service: Cardiopulmonary;  Laterality: Bilateral;  . Eye surgery      rdial keratotomy  . Video assisted thoracoscopy (vats)/wedge resection Left 09/01/2014    Procedure: VIDEO ASSISTED THORACOSCOPY (VATS)/WEDGE RESECTION;  Surgeon: [name], MD;  Location: [place];  Service: Thoracic;  Laterality: Left;   Family History  Problem Relation Age of  Onset  . Coronary artery disease Mother   . Emphysema Mother     smoked  . Stroke Maternal Uncle   . Cancer Mother     ? ovarian CA   Social History  Substance Use Topics  . Smoking status: Never Smoker   . Smokeless tobacco: Never Used  . Alcohol Use: No   OB History    No data available     Review of Systems  All other systems negative except as documented in the HPI. All pertinent positives and negatives as reviewed in the HPI.  Allergies  Dapsone; Pregabalin; Sulfonamide derivatives; and Tramadol  Home Medications   Prior to Admission medications   Medication Sig Start Date End Date  Taking? Authorizing Provider  aspirin 81 MG tablet Take 81 mg by mouth daily.    Historical Provider, MD  calcitRIOL (ROCALTROL) 0.5 MCG capsule Take 0.5 mcg by mouth once a week. Monday    Historical Provider, MD  Cholecalciferol (VITAMIN D3) 3000 UNITS TABS Take 3,000 Units by mouth daily.    Historical Provider, MD  diazepam (VALIUM) 5 MG tablet Take 5 mg by mouth every 6 (six) hours as needed for anxiety.    Historical Provider, MD  ferrous fumarate (HEMOCYTE - 106 MG FE) 325 (106 FE) MG TABS tablet Take 1 tablet by mouth every other day.    Historical Provider, MD  gentamicin cream (GARAMYCIN) 0.1 % Apply 1 application topically 3 (three) times daily.  12/21/13   Historical Provider, MD  lanthanum (FOSRENOL) 1000 MG chewable tablet Chew 1,000 mg by mouth 3 (three) times daily with meals.    Historical Provider, MD  levothyroxine (SYNTHROID, LEVOTHROID) 50 MCG tablet Take 50 mcg by mouth daily before breakfast.    Historical Provider, MD  Methoxy PEG-Epoetin Beta (MIRCERA) 75 MCG/0.3ML SOSY Inject as directed.    Historical Provider, MD  metoCLOPramide (REGLAN) 5 MG tablet Take 5 mg by mouth once.    Historical Provider, MD  midodrine (PROAMATINE) 10 MG tablet Take 10 mg by mouth 2 (two) times daily.    Historical Provider, MD  OVER THE COUNTER MEDICATION Take 2 capsules by mouth 3 (three) times daily. Juice plus capsules    Historical Provider, MD   BP 122/83 mmHg  Pulse 65  Temp(Src) 98.5 F (36.9 C) (Oral)  Resp 17  Wt 137 lb (62.143 kg)  SpO2 97% Physical Exam  Constitutional: She is oriented to person, place, and time. She appears well-developed and well-nourished. No distress.  HENT:  Head: Normocephalic and atraumatic.  Mouth/Throat: Oropharynx is clear and moist.  Eyes: Pupils are equal, round, and reactive to light.  Neck: Normal range of motion. Neck supple.  Cardiovascular: Normal rate, regular rhythm and normal heart sounds.  Exam reveals no gallop and no friction rub.    No murmur heard. Pulmonary/Chest: Effort normal and breath sounds normal. No respiratory distress. She has no wheezes.  Abdominal: Soft. Bowel sounds are normal. She exhibits no distension. There is no tenderness.  Musculoskeletal: She exhibits no edema.  Neurological: She is alert and oriented to person, place, and time. She exhibits normal muscle tone. Coordination normal.  Skin: Skin is warm and dry. No rash noted. No erythema.  Psychiatric: She has a normal mood and affect. Her behavior is normal.  Nursing note and vitals reviewed.   ED Course  Procedures (including critical care time) Labs Review Labs Reviewed  COMPREHENSIVE METABOLIC PANEL - Abnormal; Notable for the following:    Potassium 3.0 (*)  Chloride 98 (*)    BUN 61 (*)    Creatinine, Ser 12.46 (*)    Calcium 8.6 (*)    Total Protein 5.1 (*)    Albumin 1.8 (*)    Alkaline Phosphatase 185 (*)    Total Bilirubin 0.2 (*)    GFR calc non Af Amer 3 (*)    GFR calc Af Amer 3 (*)    All other components within normal limits  CBC WITH DIFFERENTIAL/PLATELET - Abnormal; Notable for the following:    RBC 3.53 (*)    Hemoglobin 10.0 (*)    HCT 31.4 (*)    Lymphs Abs 0.4 (*)    All other components within normal limits  TROPONIN I - Abnormal; Notable for the following:    Troponin I 0.24 (*)    All other components within normal limits  TROPONIN I - Abnormal; Notable for the following:    Troponin I 0.22 (*)    All other components within normal limits  TROPONIN I - Abnormal; Notable for the following:    Troponin I 0.19 (*)    All other components within normal limits  RENAL FUNCTION PANEL - Abnormal; Notable for the following:    Potassium 2.8 (*)    Chloride 98 (*)    Glucose, Bld 119 (*)    BUN 55 (*)    Creatinine, Ser 11.46 (*)    Calcium 7.9 (*)    Albumin 1.5 (*)    GFR calc non Af Amer 3 (*)    GFR calc Af Amer 4 (*)    All other components within normal limits  I-STAT TROPOININ, ED - Abnormal;  Notable for the following:    Troponin i, poc 0.21 (*)    All other components within normal limits  CK TOTAL AND CKMB (NOT AT [facility])  MAGNESIUM    Imaging Review No results found. I have personally reviewed and evaluated these images and lab results as part of my medical decision-making.   EKG Interpretation   Date/Time:  Friday August 04 2015 13:50:14 EST Ventricular Rate:  66 PR Interval:  207 QRS Duration: 76 QT Interval:  491 QTC Calculation: 514 R Axis:   94 Text Interpretation:  Sinus rhythm Borderline prolonged PR interval  Probable left atrial enlargement Borderline right axis deviation  Borderline repolarization abnormality Prolonged QT interval Since last  tracing PR and QT intervals are longer Confirmed by Eulis Foster  MD, Vira Agar  [id number]) on 08/04/2015 3:38:07 PM      Patient be admitted to the hospital for an elevated troponin.  The patient agrees the plan and all questions were answered   Dalia Heading, PA-C 08/08/15 1640  Daleen Bo, MD 08/10/15 2318

## 2015-08-05 ENCOUNTER — Observation Stay (HOSPITAL_COMMUNITY): Payer: BLUE CROSS/BLUE SHIELD

## 2015-08-05 ENCOUNTER — Observation Stay (HOSPITAL_BASED_OUTPATIENT_CLINIC_OR_DEPARTMENT_OTHER): Payer: BLUE CROSS/BLUE SHIELD

## 2015-08-05 ENCOUNTER — Other Ambulatory Visit: Payer: Self-pay | Admitting: Physician Assistant

## 2015-08-05 DIAGNOSIS — I309 Acute pericarditis, unspecified: Secondary | ICD-10-CM | POA: Diagnosis not present

## 2015-08-05 DIAGNOSIS — R778 Other specified abnormalities of plasma proteins: Secondary | ICD-10-CM | POA: Diagnosis present

## 2015-08-05 DIAGNOSIS — R7989 Other specified abnormal findings of blood chemistry: Secondary | ICD-10-CM | POA: Diagnosis not present

## 2015-08-05 DIAGNOSIS — R079 Chest pain, unspecified: Secondary | ICD-10-CM

## 2015-08-05 DIAGNOSIS — R0789 Other chest pain: Secondary | ICD-10-CM | POA: Diagnosis not present

## 2015-08-05 DIAGNOSIS — R011 Cardiac murmur, unspecified: Secondary | ICD-10-CM | POA: Diagnosis not present

## 2015-08-05 DIAGNOSIS — I4892 Unspecified atrial flutter: Secondary | ICD-10-CM

## 2015-08-05 DIAGNOSIS — N079 Hereditary nephropathy, not elsewhere classified with unspecified morphologic lesions: Secondary | ICD-10-CM | POA: Diagnosis not present

## 2015-08-05 LAB — RENAL FUNCTION PANEL
Albumin: 1.5 g/dL — ABNORMAL LOW (ref 3.5–5.0)
Anion gap: 9 (ref 5–15)
BUN: 55 mg/dL — ABNORMAL HIGH (ref 6–20)
CALCIUM: 7.9 mg/dL — AB (ref 8.9–10.3)
CHLORIDE: 98 mmol/L — AB (ref 101–111)
CO2: 29 mmol/L (ref 22–32)
CREATININE: 11.46 mg/dL — AB (ref 0.44–1.00)
GFR, EST AFRICAN AMERICAN: 4 mL/min — AB (ref >60)
GFR, EST NON AFRICAN AMERICAN: 3 mL/min — AB (ref >60)
Glucose, Bld: 119 mg/dL — ABNORMAL HIGH (ref 65–99)
Phosphorus: 3 mg/dL (ref 2.5–4.6)
Potassium: 2.8 mmol/L — ABNORMAL LOW (ref 3.5–5.1)
SODIUM: 136 mmol/L (ref 135–145)

## 2015-08-05 LAB — TROPONIN I
TROPONIN I: 0.22 ng/mL — AB (ref <0.031)
Troponin I: 0.19 ng/mL — ABNORMAL HIGH (ref <0.031)

## 2015-08-05 LAB — CK TOTAL AND CKMB (NOT AT ARMC)
CK, MB: 1.5 ng/mL (ref 0.5–5.0)
RELATIVE INDEX: INVALID (ref 0.0–2.5)
Total CK: 49 U/L (ref 38–234)

## 2015-08-05 LAB — MAGNESIUM: MAGNESIUM: 1.9 mg/dL (ref 1.7–2.4)

## 2015-08-05 MED ORDER — POTASSIUM CHLORIDE CRYS ER 20 MEQ PO TBCR
40.0000 meq | EXTENDED_RELEASE_TABLET | Freq: Once | ORAL | Status: AC
  Administered 2015-08-05: 40 meq via ORAL
  Filled 2015-08-05: qty 2

## 2015-08-05 MED ORDER — COLCHICINE 0.6 MG PO TABS: 0.6000 mg | ORAL_TABLET | Freq: Every day | ORAL | Status: DC

## 2015-08-05 MED ORDER — METOPROLOL TARTRATE 25 MG PO TABS: 12.5000 mg | ORAL_TABLET | Freq: Two times a day (BID) | ORAL | Status: DC

## 2015-08-05 NOTE — Discharge Summary (Signed)
Kimberly [name], is a 59 y.o. female  DOB 02/12/1956  MRN [id number].  Admission date:  08/04/2015  Admitting Physician  Debbe Odea, MD  Discharge Date:  08/05/2015   Primary MD  Reginia Naas, MD  Recommendations for primary care physician for things to follow:   CBC BMP next visit   Admission Diagnosis  Elevated troponin [R79.89] Chest pain, unspecified chest pain type [R07.9]   Discharge Diagnosis  Elevated troponin [R79.89] Chest pain, unspecified chest pain type [R07.9]     Principal Problem:   Atypical chest pain Active Problems:   Hypothyroidism   ANEMIA   HTN (hypertension)   Asthma   GERD   CKD (chronic kidney disease) stage V requiring chronic dialysis (HCC)   Cough   IgA nephropathy   DM type 2 causing complication (HCC)   LUL cavitary lesion post VATS 2015-cx neg   Acute pericarditis   Elevated troponin   Pain in the chest      Past Medical History  Diagnosis Date  . Kidney transplanted 01/2010    [facility], has had some rejection  . Hypothyroidism   . Anemia   . GERD (gastroesophageal reflux disease)   . Immunosuppression ([place])   . First degree AV block   . Premature atrial beat   . Premature ventricular beat   . Shingles 03/2011  . Hypertension     history; resolved on dialysis  . History of chronic cough   . History of hoarseness   . Heart murmur     child  . Stroke [facility] October 2010, March 2013    cerebral aneurysm  . Diabetes mellitus     medication induced with  steroids  . IgA nephropathy     on peritoneal dialysis  . Renal transplant failure and rejection 2012  . History of hiatal hernia     Past Surgical History  Procedure Laterality Date  . Kidney transplant Right 01/2010    [facility]  . Ureter revision  04/2011    [facility]  . Umbilical hernia repair   10/25/2009    open with mesh,  Dr [name]  . Capd insertion  08/25/2008    open, Dr [name]  . Capd removal  03/22/2010    Dr [name]  . Capd insertion N/A 12/14/2013    Procedure: LAPAROSCOPIC INSERTION CONTINUOUS AMBULATORY PERITONEAL DIALYSIS  (CAPD) CATHETER;  Surgeon: [name], MD;  Location: [place];  Service: General;  Laterality: N/A;  . Insertion of mesh N/A 12/14/2013    Procedure: INSERTION OF MESH;  Surgeon: [name], MD;  Location: [place];  Service: General;  Laterality: N/A;  . Laparoscopic lysis of adhesions N/A 12/14/2013    Procedure: LAPAROSCOPIC LYSIS OF ADHESIONS;  Surgeon: [name], MD;  Location: [place];  Service: General;  Laterality: N/A;  . Ventral hernia repair N/A 12/14/2013    Procedure: LAPAROSCOPIC VENTRAL HERNIA;  Surgeon: [name], MD;  Location: [place];  Service: General;  Laterality: N/A;  . Video bronchoscopy Bilateral  07/05/2014    Procedure: VIDEO BRONCHOSCOPY WITH FLUORO;  Surgeon: [name], MD;  Location: [facility];  Service: Cardiopulmonary;  Laterality: Bilateral;  . Eye surgery      rdial keratotomy  . Video assisted thoracoscopy (vats)/wedge resection Left 09/01/2014    Procedure: VIDEO ASSISTED THORACOSCOPY (VATS)/WEDGE RESECTION;  Surgeon: [name], MD;  Location: [place];  Service: Thoracic;  Laterality: Left;       HPI  from the history and physical done on the day of admission:   This is a 59 year old female patient chronic kidney disease on peritoneal dialysis since March 2015, failed renal transplant 2011, history of IgA nephropathy, hypothyroidism, anemia of chronic kidney disease, GERD, hypertension, history of chronic cough, history of VATS for cavitary lesion that was culture negative in December 2015. Patient presents with complaint of chest pain and palpitations. Patient reports that dating back to October 12 she began having chest pressure while laying down flat in the bed that she described as  "elephant on her chest". This had no other associated symptoms in regards to shortness of breath diaphoresis nausea and dizziness. She did have fever of 100. She reports this chest pain lasted all day and would wax and waning quality and essentially lasted for about 2 weeks. On that same date she went to see her primary care physician who ordered an EKG and a CT of the chest she reports that she was told she had "inflammation" and was given a Z-Pak. She essentially improved although several days later she did have pain that radiated first to her right ventral left jaw but improved with use of heat. About 3-4 days ago she began having what she initially described as "arrhythmia" which I later clarified as likely palpitations She reports that these symptoms lasted about 2-3 minutes. No other associated symptoms. Today she began having sharp left anterior chest discomfort like a knife. Again no other symptoms.  She was afebrile, BP 127/76, pulse 63 and respirations were 15 with room air saturations are 100%. EKG revealed sinus rhythm, ventricular rate 66 bpm, QTC 514 ms, no definitive ST or T-wave changes concerning for ischemia. In review of her previous CT scan done on 1012 there was evidence of nodular tree-in-bud densities in both upper lobes most likely inflammatory/small airway disease consistent with bronchitis. Ascites in the upper abdomen consistent with known history of peritoneal dialysis. Please see outpatient document. Chest x-ray this admission with increased bronchial markings in the lower lungs with compared to prior chest. Laboratory data with hyper with BUN is 61 creatinine 2.46. Alk phosphatase 185, albumin 1.8, troponin 0.21, WBC unremarkable except for hemoglobin 10 which is baseline for patient.     Hospital Course:     Atypical chest pain Likely musculoskeletal due to her chronic cough, CK and CK-MB unremarkable, mild troponin rise in non-ACS pattern likely due to her underlying ESRD.  Pain was atypical, EKG nonacute, echogram showed a small posterior pericardial effusion with questionable pericarditis, case discussed with nephrologist Dr. Moshe Cipro and cardiologist Dr. Radford Pax, plan is to pacer on aspirin along with colchicine with close outpatient follow-up with cardiology. Seen and cleared for discharge by cardiology. Currently symptoms almost completely resolved.   Burst of paroxysmal atrial flutter. [place] VAsc of 2  With possible acute pericarditis not anticoagulation candidate, currently in sinus, case discussed with cardiologist Dr. Radford Pax, lace on aspirin and low-dose beta blocker with outpatient follow-up with cardiology and a 30 day event monitor.   LUL cavitary lesion post  VATS 2015-cx neg/persistent chronic cough -Patient has been following with pulmonary physician Dr. Shyrl Numbers outpatient, was seen this hospitalization by him as well. She has had extensive outpatient workup which includes a biopsy by Dr. Roxan Hockey one year ago. Plan is no further inpatient workup and to follow outpatient with her pulmonary physician Dr. Christinia Gully in 1-2 weeks.   HTN (hypertension) -No longer requiring any hypertensive agents and is actually on midodrine for apparent autonomic dysfunction and setting of dialysis  ESRD requiring chronic dialysis (HCC)/ IgA nephropathy Outpatient follow up with her nephrologist, she is on PD, low potassium replaced by nephrology this admission.   DM type 2 causing complication (HCC) -No longer on medication -Last hemoglobin A1c 5.1 in 2014 therefore no longer carries a diagnosis of diabetes   Hypothyroidism -Continue Synthroid   ANEMIA -Hemoglobin stable and at baseline   Asthma? -See above regarding chronic cough -Currently not wheezing - excellent air movement   GERD -Not on PPI prior to admission       Discharge Condition: Stable  Follow UP  Follow-up Information    Follow up with Reginia Naas, MD. Schedule an  appointment as soon as possible for a visit in 1 week.   Specialty:  Family Medicine   Contact information:   [place] [address] [phone]       Follow up with Sueanne Margarita, MD. Schedule an appointment as soon as possible for a visit in 1 week.   Specialty:  Cardiology   Contact information:   [address] [place] [address] [phone]       Schedule an appointment as soon as possible for a visit with Christinia Gully, MD.   Specialty:  Pulmonary Disease   Contact information:   [address] [address] [phone]        Consults obtained - Cards  Diet and Activity recommendation: See Discharge Instructions below  Discharge Instructions           Discharge Instructions    Discharge instructions    Complete by:  As directed   Follow with Primary MD Reginia Naas, MD in 7 days   Get CBC, CMP, 2 view Chest X ray checked  by Primary MD next visit.    Activity: As tolerated with Full fall precautions use walker/cane & assistance as needed   Disposition Home    Diet: Renal  For Heart failure patients - Check your Weight same time everyday, if you gain over 2 pounds, or you develop in leg swelling, experience more shortness of breath or chest pain, call your Primary MD immediately. Follow Cardiac Low Salt Diet and 1.5 lit/day fluid restriction.   On your next visit with your primary care physician please Get Medicines reviewed and adjusted.   Please request your Prim.MD to go over all Hospital Tests and Procedure/Radiological results at the follow up, please get all Hospital records sent to your Prim MD by signing hospital release before you go home.   If you experience worsening of your admission symptoms, develop shortness of breath, life threatening emergency, suicidal or homicidal thoughts you must seek medical attention immediately by calling 911 or calling your MD immediately  if  symptoms less severe.  You Must read complete instructions/literature along with all the possible adverse reactions/side effects for all the Medicines you take and that have been prescribed to you. Take any new Medicines after you have completely understood and accpet all the  possible adverse reactions/side effects.   Do not drive, operating heavy machinery, perform activities at heights, swimming or participation in water activities or provide baby sitting services if your were admitted for syncope or siezures until you have seen by Primary MD or a Neurologist and advised to do so again.  Do not drive when taking Pain medications.    Do not take more than prescribed Pain, Sleep and Anxiety Medications  Special Instructions: If you have smoked or chewed Tobacco  in the last 2 yrs please stop smoking, stop any regular Alcohol  and or any Recreational drug use.  Wear Seat belts while driving.   Please note  You were cared for by a hospitalist during your hospital stay. If you have any questions about your discharge medications or the care you received while you were in the hospital after you are discharged, you can call the unit and asked to speak with the hospitalist on call if the hospitalist that took care of you is not available. Once you are discharged, your primary care physician will handle any further medical issues. Please note that NO REFILLS for any discharge medications will be authorized once you are discharged, as it is imperative that you return to your primary care physician (or establish a relationship with a primary care physician if you do not have one) for your aftercare needs so that they can reassess your need for medications and monitor your lab values.     Increase activity slowly    Complete by:  As directed              Discharge Medications       Medication List    TAKE these medications        aspirin 81 MG tablet  Take 81 mg by mouth daily.      calcitRIOL 0.5 MCG capsule  Commonly known as:  ROCALTROL  Take 0.5 mcg by mouth once a week. Monday     cinacalcet 30 MG tablet  Commonly known as:  SENSIPAR  Take 30 mg by mouth daily.     colchicine 0.6 MG tablet  Take 1 tablet (0.6 mg total) by mouth daily.     epoetin alfa 10000 UNIT/ML injection  Commonly known as:  EPOGEN,PROCRIT  Inject 10,000 Units into the skin every 7 (seven) days.     ferrous fumarate 325 (106 FE) MG Tabs tablet  Commonly known as:  HEMOCYTE - 106 mg FE  Take 1 tablet by mouth daily.     gentamicin cream 0.1 %  Commonly known as:  GARAMYCIN  Apply 1 application topically 3 (three) times daily.     lanthanum 1000 MG chewable tablet  Commonly known as:  FOSRENOL  Chew 1,000 mg by mouth 3 (three) times daily with meals.     levothyroxine 50 MCG tablet  Commonly known as:  SYNTHROID, LEVOTHROID  Take 50 mcg by mouth daily before breakfast.     metoCLOPramide 5 MG tablet  Commonly known as:  REGLAN  Take 5 mg by mouth daily.     metoprolol tartrate 25 MG tablet  Commonly known as:  LOPRESSOR  Take 0.5 tablets (12.5 mg total) by mouth 2 (two) times daily.     MIRCERA 75 MCG/0.3ML Sosy  Generic drug:  Methoxy PEG-Epoetin Beta  Inject as directed.     OVER THE COUNTER MEDICATION  Take 2 capsules by mouth 3 (three) times daily. Juice plus capsules     Vitamin D3 3000  UNITS Tabs  Take 3,000 Units by mouth daily.        Major procedures and Radiology Reports - PLEASE review detailed and final reports for all details, in brief -      TTE   - Left ventricle: The cavity size was normal. Systolic function was vigorous. The estimated ejection fraction was in the range of 65% to 70%. Wall motion was normal; there were no regional wall motion abnormalities. Features are consistent with a pseudonormal left ventricular filling pattern, with concomitant abnormal relaxation and increased filling pressure (grade 2  diastolic dysfunction). - Aortic valve: Poorly visualized. Trileaflet; mildly thickened, mildly calcified leaflets. - Mitral valve: Calcified annulus. - Pericardium, extracardiac: A small, free-flowing pericardial effusion was identified posterior to the heart. The fluid had no internal echoes. Features were not consistent with tamponade physiology.     Dg Chest 2 View  08/04/2015  CLINICAL DATA:  Pt is coming from PCP with complaints of Chest pain for a month. Patient has Hx of Chronic cough. Pt reports having cough a month ago that cleared up with antibiotics. Patient's pain is 2/10, sharp, stabbing in nature that increases with deep breathing and coughing. Patient's cough returned after treatment. EXAM: CHEST  2 VIEW COMPARISON:  10/04/2014 FINDINGS: Cardiac silhouette is mildly enlarged. No mediastinal or hilar masses convincing adenopathy. Surgical anastomosis staples lie at the left apex with some subtle associated opacity, stable. There are streaky opacities in the lung bases increased from the prior chest radiograph. This could reflect acute bronchitis on chronic bronchitic change. There is no consolidation to suggest pneumonia. There is no pulmonary edema. No pleural effusion pneumothorax. Lungs are hyperexpanded. Bony thorax is grossly intact. IMPRESSION: 1. Increased bronchial markings in the lower lungs when compared to the prior chest radiograph. This suggests acute on chronic bronchitis in the proper clinical setting. 2. No evidence of pneumonia.  No pulmonary edema. Electronically Signed   By: Lajean Manes M.D.   On: 08/04/2015 15:25   [place] Cm  08/05/2015  CLINICAL DATA:  CONGESTION, CHRONIC COUGH, C/O SYMPTOMS X 15 YEARS, HX HTN-STROKE-DIABETES, NO PRIOR SURGERY TO SINUSES, BB MARKER PLACED TO RIGHT OUTER CANTHUS, LW EXAM: CT PARANASAL SINUS LIMITED WITHOUT CONTRAST TECHNIQUE: Non-contiguous multidetector CT images of the paranasal sinuses were  obtained in a single plane without contrast. COMPARISON:  None. FINDINGS: Paranasal sinuses are normally aerated. No significant mucoperiosteal thickening or air-fluid levels are identified. The visualized portions of the mastoid air cells are also normally aerated. IMPRESSION: Negative exam. Electronically Signed   By: Nolon Nations M.D.   On: 08/05/2015 11:41    Micro Results      No results found for this or any previous visit (from the past 240 hour(s)).     Today   Subjective    Kimberly [name] today has no headache,no chest abdominal pain,no new weakness tingling or numbness, feels much better wants to go home today.     Objective   Blood pressure 114/72, pulse 70, temperature 99.1 F (37.3 C), temperature source Oral, resp. rate 18, height 5' 6" (1.676 m), weight 62.007 kg (136 lb 11.2 oz), SpO2 94 %.   Intake/Output Summary (Last 24 hours) at 08/05/15 1540 Last data filed at 08/05/15 0900  Gross per 24 hour  Intake    582 ml  Output      0 ml  Net    582 ml    Exam Awake Alert, Oriented x 3, No new F.N  deficits, Normal affect .AT,PERRAL Supple Neck,No JVD, No cervical lymphadenopathy appriciated.  Symmetrical Chest wall movement, Good air movement bilaterally, CTAB RRR,No Gallops,Rubs or new Murmurs, No Parasternal Heave +ve B.Sounds, Abd Soft, Non tender, No organomegaly appriciated, No rebound -guarding or rigidity. No Cyanosis, Clubbing or edema, No new Rash or bruise   Data Review   CBC w Diff:  Lab Results  Component Value Date   WBC 4.0 08/04/2015   HGB 10.0* 08/04/2015   HCT 31.4* 08/04/2015   PLT 225 08/04/2015   LYMPHOPCT 11 08/04/2015   MONOPCT 11 08/04/2015   EOSPCT 4 08/04/2015   BASOPCT 1 08/04/2015    CMP:  Lab Results  Component Value Date   NA 136 08/05/2015   K 2.8* 08/05/2015   CL 98* 08/05/2015   CO2 29 08/05/2015   BUN 55* 08/05/2015   CREATININE 11.46* 08/05/2015   CREATININE 8.21* 08/29/2014   PROT 5.1* 08/04/2015    ALBUMIN 1.5* 08/05/2015   BILITOT 0.2* 08/04/2015   ALKPHOS 185* 08/04/2015   AST 15 08/04/2015   ALT 16 08/04/2015  .   Total Time in preparing paper work, data evaluation and todays exam - 35 minutes  Thurnell Lose M.D on 08/05/2015 at 3:40 PM  [facility]   Office  [phone]

## 2015-08-05 NOTE — Progress Notes (Signed)
Deborah Agricultural consultant called for the Dialysis RN to come down to do patient's peritoneal dialysis and she arrived at 2200 to do her exchange.

## 2015-08-05 NOTE — Consult Note (Addendum)
Admit date: 08/04/2015 Referring Physician  Dr. Candiss Norse   Primary Physician  Dr. [name] Primary Cardiologist  NONE Reason for Consultation  Chest pain  HPI: This is a 59 year old female patient chronic kidney disease on peritoneal dialysis since March 2015, failed renal transplant 2011, history of IgA nephropathy, hypothyroidism, anemia of chronic kidney disease, GERD, hypertension, history of chronic cough, history of VATS for cavitary lesion that was culture negative in December 2015. Patient presents with complaint of chest pain and palpitations. Patient reports that dating back to October 12 she began having chest pressure while laying down flat in the bed that she described as "elephant on her chest". She had no other associated symptoms in regards to shortness of breath, diaphoresis, nausea and dizziness but did have a cough which she says is chronic. She did have fever of 100. She reports this chest pain lasted all day and would wax and waning quality and essentially lasted for about 2 weeks. On that same date she went to see her primary care physician who ordered an EKG and a CT of the chest she reports that she was told she had "inflammation" and was given a Z-Pak. She essentially improved although several days later she did have pain that radiated first to her right  jaw but improved with use of heat. She saw her PCP and took another round of Z pac.  About 3-4 days ago she began having palpitations She reports that these symptoms lasted about 2-3 minutes. No other associated symptoms. Yesterday she began having sharp left anterior chest discomfort like a knife. Again no other symptoms.  In ER she was afebrile, BP 127/76, pulse 63 and respirations were 15 with room air saturations are 100%. EKG revealed sinus rhythm, ventricular rate 66 bpm, QTC 514 ms, no definitive ST or T-wave changes concerning for ischemia. In review of her previous CT scan done on 1012 there was evidence of nodular  tree-in-bud densities in both upper lobes most likely inflammatory/small airway disease consistent with bronchitis. Ascites in the upper abdomen consistent with known history of peritoneal dialysis. Chest x-ray this admission with increased bronchial markings in the lower lungs with compared to prior chest. Laboratory data with hyper with BUN is 61 creatinine 2.46. Alk phosphatase 185, albumin 1.8, troponin 0.21, WBC unremarkable except for hemoglobin 10 which is baseline for patient.  Cardiology is now asked to consult for further evaluation of chest pain.  Echo is pending.     PMH:   Past Medical History  Diagnosis Date  . Kidney transplanted 01/2010    [facility], has had some rejection  . Hypothyroidism   . Anemia   . GERD (gastroesophageal reflux disease)   . Immunosuppression ([place])   . First degree AV block   . Premature atrial beat   . Premature ventricular beat   . Shingles 03/2011  . Hypertension     history; resolved on dialysis  . History of chronic cough   . History of hoarseness   . Heart murmur     child  . Stroke [facility]) October 2010, March 2013    cerebral aneurysm  . Diabetes mellitus     medication induced with  steroids  . IgA nephropathy     on peritoneal dialysis  . Renal transplant failure and rejection 2012  . History of hiatal hernia      PSH:   Past Surgical History  Procedure Laterality Date  . Kidney transplant Right 01/2010    [facility]  .  Ureter revision  04/2011    [facility]  . Umbilical hernia repair  10/25/2009    open with mesh,  Dr [name]  . Capd insertion  08/25/2008    open, Dr [name]  . Capd removal  03/22/2010    Dr [name]  . Capd insertion N/A 12/14/2013    Procedure: LAPAROSCOPIC INSERTION CONTINUOUS AMBULATORY PERITONEAL DIALYSIS  (CAPD) CATHETER;  Surgeon: [name], MD;  Location: [place];  Service: General;  Laterality: N/A;  . Insertion of mesh N/A 12/14/2013    Procedure: INSERTION OF MESH;  Surgeon: [name], MD;  Location: [place];   Service: General;  Laterality: N/A;  . Laparoscopic lysis of adhesions N/A 12/14/2013    Procedure: LAPAROSCOPIC LYSIS OF ADHESIONS;  Surgeon: [name], MD;  Location: [place];  Service: General;  Laterality: N/A;  . Ventral hernia repair N/A 12/14/2013    Procedure: LAPAROSCOPIC VENTRAL HERNIA;  Surgeon: [name], MD;  Location: [place];  Service: General;  Laterality: N/A;  . Video bronchoscopy Bilateral 07/05/2014    Procedure: VIDEO BRONCHOSCOPY WITH FLUORO;  Surgeon: [name], MD;  Location: [facility];  Service: Cardiopulmonary;  Laterality: Bilateral;  . Eye surgery      rdial keratotomy  . Video assisted thoracoscopy (vats)/wedge resection Left 09/01/2014    Procedure: VIDEO ASSISTED THORACOSCOPY (VATS)/WEDGE RESECTION;  Surgeon: [name], MD;  Location: [place];  Service: Thoracic;  Laterality: Left;    Allergies:  Dapsone; Pregabalin; Sulfonamide derivatives; and Tramadol Prior to Admit Meds:   Prescriptions prior to admission  Medication Sig Dispense Refill Last Dose  . calcitRIOL (ROCALTROL) 0.5 MCG capsule Take 0.5 mcg by mouth once a week. Monday   07/31/2015 at Unknown time  . Cholecalciferol (VITAMIN D3) 3000 UNITS TABS Take 3,000 Units by mouth daily.   08/04/2015 at Unknown time  . cinacalcet (SENSIPAR) 30 MG tablet Take 30 mg by mouth daily.   08/03/2015 at Unknown time  . epoetin alfa (EPOGEN,PROCRIT) 32951 UNIT/ML injection Inject 10,000 Units into the skin every 7 (seven) days.   08/03/2015 at Unknown time  . ferrous fumarate (HEMOCYTE - 106 MG FE) 325 (106 FE) MG TABS tablet Take 1 tablet by mouth daily.    08/04/2015 at Unknown time  . lanthanum (FOSRENOL) 1000 MG chewable tablet Chew 1,000 mg by mouth 3 (three) times daily with meals.   08/04/2015 at Unknown time  . levothyroxine (SYNTHROID, LEVOTHROID) 50 MCG tablet Take 50 mcg by mouth daily before breakfast.   08/04/2015 at Unknown time  . metoCLOPramide (REGLAN) 5 MG tablet Take 5 mg by  mouth daily.    08/04/2015 at Unknown time  . OVER THE COUNTER MEDICATION Take 2 capsules by mouth 3 (three) times daily. Juice plus capsules   08/04/2015 at Unknown time  . aspirin 81 MG tablet Take 81 mg by mouth daily.   08/04/2015  . gentamicin cream (GARAMYCIN) 0.1 % Apply 1 application topically 3 (three) times daily.    Taking  . Methoxy PEG-Epoetin Beta (MIRCERA) 75 MCG/0.3ML SOSY Inject as directed.   08/03/2015   Fam HX:    Family History  Problem Relation Age of Onset  . Coronary artery disease Mother   . Emphysema Mother     smoked  . Stroke Maternal Uncle   . Cancer Mother     ? ovarian CA   Social HX:    Social History   Social History  . Marital Status: Married  Spouse Name: N/A  . Number of Children: N/A  . Years of Education: N/A   Occupational History  . Not on file.   Social History Main Topics  . Smoking status: Never Smoker   . Smokeless tobacco: Never Used  . Alcohol Use: No  . Drug Use: No  . Sexual Activity: Yes    Birth Control/ Protection: Post-menopausal   Other Topics Concern  . Not on file   Social History Narrative   Pt lives in [place].  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     ROS:  All 11 ROS were addressed and are negative except what is stated in the HPI  Physical Exam: Blood pressure 114/72, pulse 70, temperature 99.1 F (37.3 C), temperature source Oral, resp. rate 18, height 5' 6"  (1.676 m), weight 136 lb 11.2 oz (62.007 kg), SpO2 94 %.    General: Well developed, well nourished, in no acute distress Head: Eyes PERRLA, No xanthomas.   Normal cephalic and atramatic  Lungs:   Clear bilaterally to auscultation and percussion. Heart:   HRRR S1 S2 Pulses are 2+ & equal. 2/6 SM at LLSB            No carotid bruit. No JVD.  No abdominal bruits. No femoral bruits. Abdomen: Bowel sounds are positive, abdomen soft and non-tender without masses Extremities:   No clubbing, cyanosis or edema.  DP  +1 Neuro: Alert and oriented X 3. Psych:  Good affect, responds appropriately    Labs:   Lab Results  Component Value Date   WBC 4.0 08/04/2015   HGB 10.0* 08/04/2015   HCT 31.4* 08/04/2015   MCV 89.0 08/04/2015   PLT 225 08/04/2015    Recent Labs Lab 08/04/15 1502 08/05/15 0940  NA 135 136  K 3.0* 2.8*  CL 98* 98*  CO2 27 29  BUN 61* 55*  CREATININE 12.46* 11.46*  CALCIUM 8.6* 7.9*  PROT 5.1*  --   BILITOT 0.2*  --   ALKPHOS 185*  --   ALT 16  --   AST 15  --   GLUCOSE 81 119*   No results found for: PTT Lab Results  Component Value Date   INR 1.19 08/23/2014   INR 1.16 11/29/2011   INR 1.04 07/09/2009   Lab Results  Component Value Date   CKTOTAL 49 08/05/2015   CKMB 1.5 08/05/2015   TROPONINI 0.19* 08/05/2015     Lab Results  Component Value Date   CHOL 148 11/30/2011   CHOL  07/08/2009    143        ATP III CLASSIFICATION:  <200     mg/dL   Desirable  200-239  mg/dL   Borderline High  >=240    mg/dL   High          Lab Results  Component Value Date   HDL 54 11/30/2011   HDL 45 07/08/2009   Lab Results  Component Value Date   LDLCALC 76 11/30/2011   [place]  07/08/2009    93        Total Cholesterol/HDL:CHD Risk Coronary Heart Disease Risk Table                     Men   Women  1/2 Average Risk   3.4   3.3  Average Risk       5.0   4.4  2 X Average Risk   9.6   7.1  3 X Average Risk  23.4   11.0        Use the calculated Patient Ratio above and the CHD Risk Table to determine the patient's CHD Risk.        ATP III CLASSIFICATION (LDL):  <100     mg/dL   Optimal  100-129  mg/dL   Near or Above                    Optimal  130-159  mg/dL   Borderline  160-189  mg/dL   High  >190     mg/dL   Very High   Lab Results  Component Value Date   TRIG 89 11/30/2011   TRIG 23 07/08/2009   Lab Results  Component Value Date   CHOLHDL 2.7 11/30/2011   CHOLHDL 3.2 07/08/2009   No results found for: LDLDIRECT    Radiology:  Dg  Chest 2 View  08/04/2015  CLINICAL DATA:  Pt is coming from PCP with complaints of Chest pain for a month. Patient has Hx of Chronic cough. Pt reports having cough a month ago that cleared up with antibiotics. Patient's pain is 2/10, sharp, stabbing in nature that increases with deep breathing and coughing. Patient's cough returned after treatment. EXAM: CHEST  2 VIEW COMPARISON:  10/04/2014 FINDINGS: Cardiac silhouette is mildly enlarged. No mediastinal or hilar masses convincing adenopathy. Surgical anastomosis staples lie at the left apex with some subtle associated opacity, stable. There are streaky opacities in the lung bases increased from the prior chest radiograph. This could reflect acute bronchitis on chronic bronchitic change. There is no consolidation to suggest pneumonia. There is no pulmonary edema. No pleural effusion pneumothorax. Lungs are hyperexpanded. Bony thorax is grossly intact. IMPRESSION: 1. Increased bronchial markings in the lower lungs when compared to the prior chest radiograph. This suggests acute on chronic bronchitis in the proper clinical setting. 2. No evidence of pneumonia.  No pulmonary edema. Electronically Signed   By: Lajean Manes M.D.   On: 08/04/2015 15:25   H[place] Cm  08/05/2015  CLINICAL DATA:  CONGESTION, CHRONIC COUGH, C/O SYMPTOMS X 15 YEARS, HX HTN-STROKE-DIABETES, NO PRIOR SURGERY TO SINUSES, BB MARKER PLACED TO RIGHT OUTER CANTHUS, LW EXAM: CT PARANASAL SINUS LIMITED WITHOUT CONTRAST TECHNIQUE: Non-contiguous multidetector CT images of the paranasal sinuses were obtained in a single plane without contrast. COMPARISON:  None. FINDINGS: Paranasal sinuses are normally aerated. No significant mucoperiosteal thickening or air-fluid levels are identified. The visualized portions of the mastoid air cells are also normally aerated. IMPRESSION: Negative exam. Electronically Signed   By: Nolon Nations M.D.   On: 08/05/2015 11:41    EKG:  NSR with  IRBBB and nonspecific ST abnormality  ASSESSMENT/PLAN: 1.  Atypical chest pain that initially was worse lying down that sitting up.  This occurred 2 weeks ago in the setting of what sounds like a URI.  Then had sharp pain in her left breast yesterday. EKG with no acute ST changes.  Troponin is slightly elevated but this is in the setting of CKD.  I do not think that this represents an acute coronary syndrome. I suspect her symptoms are due to viral URI with acute pleuritis, but this could represent acute pericarditis, especially in the setting of recent URI symptoms.  Could consider treating for presumed pericarditis with NSAIDS if ok with renal.  If not then could consider a short taper of steroids.  Cannot use colchicine due to CKD.  Await results of echo.  No evidence of pericardial effusion on CT on 07/05/15. 2.  Mildly elevated troponin in the setting of CKD - again this is a flat trend.  Await results of echo.  If LVF is normal then can get outpt nuclear stress test as she does have CRF including HTN, DM and family history of CAD. 3.  HTN - controlled 4.  ? URI with chronic cough.  Her exam today shows coarse rhonchi with cough and temp 99 5.  Heart murmur - she has been told that she has had this in the past - will check 2D echo.  Sueanne Margarita, MD  08/05/2015  12:49 PM

## 2015-08-05 NOTE — Progress Notes (Signed)
Patient ID: Kimberly [name], female   DOB: 12/26/1955, 59 y.o.   MRN: [id number]  [facility] Progress Note   Assessment/ Plan:   1. Chest pain: No clear indication of ACS at this time- awaiting 2D Echo evaluation and further re-evaluation of bigeminy with consideration of low dose beta blocker. 2. End-stage renal disease: continue PD at current prescription, she remains euvloemic. Check labs.  3. Anemia: No overt loss noted, on weekly ESA with PD 4. Metabolic bone disease: Resumed phosphorus binders as well as Sensipar for PTH suppression 5. Hypertension/volume status: euvolemic and without edema, BP at goal  Subjective:   Reports palpitations overnight without chest pain- event monitor showed bigeminy   Objective:   BP 114/72 mmHg  Pulse 70  Temp(Src) 99.1 F (37.3 C) (Oral)  Resp 18  Ht 5\' 6"  (1.676 m)  Wt 62.007 kg (136 lb 11.2 oz)  BMI 22.07 kg/m2  SpO2 94%  Physical Exam: [id number] resting in bed [id number] RRR, normal s1 and s2 Resp:CTA bilaterally, no rales/rhonchi [id number], distended, non-tender Ext:No LE edema  Labs: BMET  Recent Labs Lab 08/04/15 1502  NA 135  K 3.0*  CL 98*  CO2 27  GLUCOSE 81  BUN 61*  CREATININE 12.46*  CALCIUM 8.6*   CBC  Recent Labs Lab 08/04/15 1502  WBC 4.0  NEUTROABS 2.9  HGB 10.0*  HCT 31.4*  MCV 89.0  PLT 225   Medications:    . aspirin EC  81 mg Oral Daily  . cholecalciferol  3,000 Units Oral Daily  . cinacalcet  30 mg Oral Q supper  . dialysis solution 1.5% low-MG/low-CA   Intraperitoneal Q24H  . dialysis solution 1.5% low-MG/low-CA   Intraperitoneal Q24H  . dialysis solution 1.5% low-MG/low-CA   Intraperitoneal Q24H  . diclofenac sodium  2 g Topical QID  . ferrous fumarate  1 tablet Oral Daily  . gentamicin cream  1 application Topical Daily  . heparin  5,000 Units Subcutaneous 3 times per day  . lanthanum  1,000 mg Oral TID WC  . levothyroxine  50 mcg Oral QAC breakfast  .  metoCLOPramide  5 mg Oral Daily   Elmarie Shiley, MD 08/05/2015, 8:37 AM

## 2015-08-05 NOTE — Progress Notes (Signed)
Called by RN. Tele demonstrates very brief episodes of AFib (< 1 minute) Now in NSR. CHADS2-VASc=3.  If sustained AFib, will need to change DVT heparin to full dose. Will need to decide if she is a candidate for anticoagulation prior to DC.  May need to consider event monitor after DC to document AF burden. Will continue to monitor. Richardson Dopp, PA-C   08/05/2015 3:26 PM

## 2015-08-05 NOTE — Plan of Care (Signed)
Problem: Safety: Goal: Ability to remain free from injury will improve Outcome: Progressing Pt able to walk with standby assist. Pt understands to call for help if needed.

## 2015-08-05 NOTE — Progress Notes (Signed)
  Echocardiogram 2D Echocardiogram has been performed.  Darlina Sicilian M 08/05/2015, 12:47 PM

## 2015-08-05 NOTE — Discharge Instructions (Signed)
Follow with Primary MD Reginia Naas, MD in 7 days   Get CBC, CMP, 2 view Chest X ray checked  by Primary MD next visit.    Activity: As tolerated with Full fall precautions use walker/cane & assistance as needed   Disposition Home    Diet: Renal  For Heart failure patients - Check your Weight same time everyday, if you gain over 2 pounds, or you develop in leg swelling, experience more shortness of breath or chest pain, call your Primary MD immediately. Follow Cardiac Low Salt Diet and 1.5 lit/day fluid restriction.   On your next visit with your primary care physician please Get Medicines reviewed and adjusted.   Please request your Prim.MD to go over all Hospital Tests and Procedure/Radiological results at the follow up, please get all Hospital records sent to your Prim MD by signing hospital release before you go home.   If you experience worsening of your admission symptoms, develop shortness of breath, life threatening emergency, suicidal or homicidal thoughts you must seek medical attention immediately by calling 911 or calling your MD immediately  if symptoms less severe.  You Must read complete instructions/literature along with all the possible adverse reactions/side effects for all the Medicines you take and that have been prescribed to you. Take any new Medicines after you have completely understood and accpet all the possible adverse reactions/side effects.   Do not drive, operating heavy machinery, perform activities at heights, swimming or participation in water activities or provide baby sitting services if your were admitted for syncope or siezures until you have seen by Primary MD or a Neurologist and advised to do so again.  Do not drive when taking Pain medications.    Do not take more than prescribed Pain, Sleep and Anxiety Medications  Special Instructions: If you have smoked or chewed Tobacco  in the last 2 yrs please stop smoking, stop any regular  Alcohol  and or any Recreational drug use.  Wear Seat belts while driving.   Please note  You were cared for by a hospitalist during your hospital stay. If you have any questions about your discharge medications or the care you received while you were in the hospital after you are discharged, you can call the unit and asked to speak with the hospitalist on call if the hospitalist that took care of you is not available. Once you are discharged, your primary care physician will handle any further medical issues. Please note that NO REFILLS for any discharge medications will be authorized once you are discharged, as it is imperative that you return to your primary care physician (or establish a relationship with a primary care physician if you do not have one) for your aftercare needs so that they can reassess your need for medications and monitor your lab values.

## 2015-08-05 NOTE — Plan of Care (Signed)
Problem: Education: Goal: Knowledge of [facility] Education information/materials will improve Outcome: Progressing Pt a/o X 3 able to communicate needs and discomforts. Will continue to monitor pt status and provide emotional support as needed     

## 2015-08-07 ENCOUNTER — Ambulatory Visit: Payer: BLUE CROSS/BLUE SHIELD | Admitting: Internal Medicine

## 2015-08-08 ENCOUNTER — Telehealth: Payer: Self-pay | Admitting: Internal Medicine

## 2015-08-08 NOTE — Telephone Encounter (Signed)
Reviewed results of echo with pt.  She had not started colchicine because her pharmacy didn't have it until last night, she was unsure of the dose because of her renal disease and she thought it was for gout.  Advised colchicine is used for the treatment of pericardial effusion and that according to the documentation it was reviewed with her kidney MD.  She thanked me for my time and will f/u as scheduled.

## 2015-08-08 NOTE — Telephone Encounter (Signed)
Want to know the results of the Echo that she had done in the hospital.  She is schedule to see Dr. Rayann Heman on 08-21-15.

## 2015-08-11 ENCOUNTER — Ambulatory Visit (INDEPENDENT_AMBULATORY_CARE_PROVIDER_SITE_OTHER): Payer: BLUE CROSS/BLUE SHIELD | Admitting: Cardiology

## 2015-08-11 ENCOUNTER — Encounter: Payer: Self-pay | Admitting: Cardiology

## 2015-08-11 ENCOUNTER — Ambulatory Visit (INDEPENDENT_AMBULATORY_CARE_PROVIDER_SITE_OTHER): Payer: BLUE CROSS/BLUE SHIELD

## 2015-08-11 VITALS — BP 122/76 | HR 70 | Ht 67.2 in | Wt 137.6 lb

## 2015-08-11 DIAGNOSIS — I480 Paroxysmal atrial fibrillation: Secondary | ICD-10-CM

## 2015-08-11 DIAGNOSIS — R079 Chest pain, unspecified: Secondary | ICD-10-CM | POA: Diagnosis not present

## 2015-08-11 DIAGNOSIS — I4892 Unspecified atrial flutter: Secondary | ICD-10-CM

## 2015-08-11 NOTE — Progress Notes (Signed)
08/11/2015 Kimberly [name]   [birth year]  LZ:[id number]  Primary Physician Reginia Naas, MD Primary Cardiologist: Dr. Radford Pax Electrophysiologist: Dr. Rayann Heman  Reason for Visit/CC: [facility] F/u for Acute Pericarditis and PAF  HPI:  The patient is a 59 y/o female, with a h/o chronic kidney disease on peritoneal dialysis since March 2015, failed renal transplant 2011, history of IgA nephropathy, hypothyroidism, anemia of chronic kidney disease, GERD, hypertension, DM, history of chronic cough, history of VATS for cavitary lesion that was culture negative in December 2015.   She was recently admitted to [facility] 08/04/15 for evaluation of chest pain and palpitations. On arrival, EKG showed NSR. Her CP was consistent with pericarditis. This was in the setting of a recent URI.  2D echo showed normal LV systolic function with an EF of 65-70% with normal wall motion and grade 2DD. There was also a small, free-flowing pericardial effusion identified posterior to the heart. Features were not c/w tamponade physiology. She was placed on ASA + colchicine.  In addition, the patient was also observed to have a brief run of PAF on telemetry, but converted back to NSR spontaneously and was in SR at time of discharge. Her CHA2DS2 VASc score is 3 for female sex, HTN and DM. However, decision was made to avoid anticoagulation, at present, given her acute pericarditis and risk to convert to hemorrhagic. Decision was also made to further evaluate for recurrent arrhthymias with an outpatient heart monitor. She is scheduled for heart monitor set-up today. It was also advised by Dr. Radford Pax that she undergo an outpatient NST to assess for ischemia, given her multiple risk factors.   She now presents back to clinic today for post hospital f/u. She reports that she has done fairly well. She admits that she has not taken colchicine as she was concerned regarding the side effects and fact that she has CKD. She is scheduled  to meet with her nephrologist today and plans on discussing to determine if it is ok for her to take. She has continued ASA, but low dose. Her CP has improved. She continues to have mild DOE, but it has not worsened. No resting dyspnea. No syncope/ near syncope. She does have a dry cough. She has also continued to experience frequent palpitations, but she is currently asymptomatic. She has been compliant with metoprolol. She reports avoidance of triggers.       Current Outpatient Prescriptions  Medication Sig Dispense Refill  . aspirin 81 MG tablet Take 81 mg by mouth daily.    . calcitRIOL (ROCALTROL) 0.5 MCG capsule Take 0.5 mcg by mouth once a week. Monday    . Cholecalciferol (VITAMIN D3) 3000 UNITS TABS Take 3,000 Units by mouth daily.    . colchicine 0.6 MG tablet Take 1 tablet (0.6 mg total) by mouth daily. 30 tablet 0  . epoetin alfa (EPOGEN,PROCRIT) 16109 UNIT/ML injection Inject 10,000 Units into the skin every 7 (seven) days.    . ferrous fumarate (HEMOCYTE - 106 MG FE) 325 (106 FE) MG TABS tablet Take 1 tablet by mouth daily.     Marland Kitchen gentamicin cream (GARAMYCIN) 0.1 % Apply 1 application topically 3 (three) times daily.     Marland Kitchen lanthanum (FOSRENOL) 1000 MG chewable tablet Chew 1,000 mg by mouth 3 (three) times daily with meals.    Marland Kitchen levothyroxine (SYNTHROID, LEVOTHROID) 50 MCG tablet Take 50 mcg by mouth daily before breakfast.    . metoCLOPramide (REGLAN) 5 MG tablet Take 5 mg by mouth daily.     Marland Kitchen  metoprolol tartrate (LOPRESSOR) 25 MG tablet Take 0.5 tablets (12.5 mg total) by mouth 2 (two) times daily. 30 tablet 0  . OVER THE COUNTER MEDICATION Take 2 capsules by mouth 3 (three) times daily. Juice plus capsules    . cinacalcet (SENSIPAR) 30 MG tablet Take 30 mg by mouth daily.     No current facility-administered medications for this visit.    Allergies  Allergen Reactions  . Dapsone Rash    Pain in feet  . Pregabalin Rash and Other (See Comments)    fever  . Sulfonamide  Derivatives Rash  . Tramadol Rash    Rash on cheek    Social History   Social History  . Marital Status: Married    Spouse Name: N/A  . Number of Children: N/A  . Years of Education: N/A   Occupational History  . Not on file.   Social History Main Topics  . Smoking status: Never Smoker   . Smokeless tobacco: Never Used  . Alcohol Use: No  . Drug Use: No  . Sexual Activity: Yes    Birth Control/ Protection: Post-menopausal   Other Topics Concern  . Not on file   Social History Narrative   Pt lives in [place].  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     Review of Systems: General: negative for chills, fever, night sweats or weight changes.  Cardiovascular: negative for chest pain, dyspnea on exertion, edema, orthopnea, palpitations, paroxysmal nocturnal dyspnea or shortness of breath Dermatological: negative for rash Respiratory: negative for cough or wheezing Urologic: negative for hematuria Abdominal: negative for nausea, vomiting, diarrhea, bright red blood per rectum, melena, or hematemesis Neurologic: negative for visual changes, syncope, or dizziness All other systems reviewed and are otherwise negative except as noted above.    Blood pressure 122/76, pulse 70, height 5' 7.2" (1.707 m), weight 137 lb 9.6 oz (62.415 kg), SpO2 95 %.  General appearance: alert, cooperative and no distress Neck: no carotid bruit and no JVD Lungs: clear to auscultation bilaterally Heart: regular rate and rhythm and 2/6 SM loudest along RUSB Extremities: extremities normal, atraumatic, no cyanosis or edema Pulses: 2+ and symmetric Skin: Skin color, texture, turgor normal. No rashes or lesions Neurologic: Grossly normal  EKG not performed  ASSESSMENT AND PLAN:   1. Acute Pericarditis:  Mild improvement in symptoms but still with mild DOE and dry cough. Symptoms have not worsened since discharge. VSS. No syncope/ near syncope. She will discuss with  nephrologist use of colchicine at her f/u appt today. If unable to take, she will notify our office. Can consider treating with prednisone.    2. PAF: brief run captured during recent hospitalization. Exam reveals RRR. Plan for evaluation with heart monitor to assess for recurrent arrythmias. She is already scheduled for f/u with Dr. Rayann Heman in 2 weeks. I recommended checking TSH, as she has known hypothyroidism, on Synthroid. She will obtain labs from her PCP. Continue metoprolol for rate control and avoidance of triggers. Her CHA2DS2 VASc score is 3 (female/HTN/DM) but would avoid anticoagulation at present given acute pericarditis and risk to convert to hemorrhagic. Most likely atrial arrhythmias secondary to acute inflammation. If she continues to have PAF after pericarditis resolved then will need long term anticoagulation.Will defer to Dr. Rayann Heman when he see's her back in f/u. As advise by Dr. Radford Pax, will also obtain a NST to r/o ischemia as a potential etiology, given her multiple risk factors.    PLAN  F/u  with Dr. Rayann Heman 08/21/15.  Lyda Jester PA-C 08/11/2015 8:33 AM

## 2015-08-11 NOTE — Patient Instructions (Signed)
Medication Instructions:  Your physician recommends that you continue on your current medications as directed. Please refer to the Current Medication list given to you today.   Labwork: Please follow up lab work on TSH with PCP  Testing/Procedures: Your physician has requested that you have a lexiscan myoview. For further information please visit [email or web address]. Please follow instruction sheet, as given.    Follow-Up: Your physician recommends that you keep your scheduled follow-up appointment with Dr. Rayann Heman   Any Other Special Instructions Will Be Listed Below (If Applicable).     If you need a refill on your cardiac medications before your next appointment, please call your pharmacy.

## 2015-08-21 ENCOUNTER — Ambulatory Visit (INDEPENDENT_AMBULATORY_CARE_PROVIDER_SITE_OTHER): Payer: BLUE CROSS/BLUE SHIELD | Admitting: Internal Medicine

## 2015-08-21 ENCOUNTER — Encounter: Payer: Self-pay | Admitting: Internal Medicine

## 2015-08-21 VITALS — BP 114/82 | HR 62 | Ht 66.0 in | Wt 140.6 lb

## 2015-08-21 DIAGNOSIS — I309 Acute pericarditis, unspecified: Secondary | ICD-10-CM

## 2015-08-21 DIAGNOSIS — I480 Paroxysmal atrial fibrillation: Secondary | ICD-10-CM | POA: Diagnosis not present

## 2015-08-21 NOTE — Progress Notes (Signed)
PCP: Reginia Naas, MD  Kimberly [name] is a 59 y.o. female who presents today for routine electrophysiology followup. I have not seen her since 2013 at which time she was evaluated for PVCs.  She has done well until recently developing pericarditis.  While in the hospital, she was noted to have short afib (<1 minutes).  She reports associated palpitations which have resolved with treatment of her pericarditis.  Currently she is doing well.  Today, she denies symptoms of chest pain, shortness of breath,  lower extremity edema, dizziness, presyncope, or syncope.  The patient is otherwise without complaint today.   Past Medical History  Diagnosis Date  . Kidney transplanted 01/2010    [facility], has had some rejection  . Hypothyroidism   . Anemia   . GERD (gastroesophageal reflux disease)   . Immunosuppression ([place])   . First degree AV block   . Premature atrial beat   . Premature ventricular beat   . Shingles 03/2011  . Hypertension     history; resolved on dialysis  . History of chronic cough   . History of hoarseness   . Heart murmur     child  . Stroke [facility]) October 2010, March 2013    cerebral aneurysm  . Diabetes mellitus     medication induced with  steroids  . IgA nephropathy     on peritoneal dialysis  . Renal transplant failure and rejection 2012  . History of hiatal hernia    Past Surgical History  Procedure Laterality Date  . Kidney transplant Right 01/2010    [facility]  . Ureter revision  04/2011    [facility]  . Umbilical hernia repair  10/25/2009    open with mesh,  Dr [name]  . Capd insertion  08/25/2008    open, Dr [name]  . Capd removal  03/22/2010    Dr [name]  . Capd insertion N/A 12/14/2013    Procedure: LAPAROSCOPIC INSERTION CONTINUOUS AMBULATORY PERITONEAL DIALYSIS  (CAPD) CATHETER;  Surgeon: [name], MD;  Location: [place];  Service: General;  Laterality: N/A;  . Insertion of mesh N/A 12/14/2013    Procedure: INSERTION OF MESH;  Surgeon: [name], MD;  Location: [place];  Service: General;  Laterality: N/A;  . Laparoscopic lysis of adhesions N/A 12/14/2013    Procedure: LAPAROSCOPIC LYSIS OF ADHESIONS;  Surgeon: [name], MD;  Location: [place];  Service: General;  Laterality: N/A;  . Ventral hernia repair N/A 12/14/2013    Procedure: LAPAROSCOPIC VENTRAL HERNIA;  Surgeon: [name], MD;  Location: [place];  Service: General;  Laterality: N/A;  . Video bronchoscopy Bilateral 07/05/2014    Procedure: VIDEO BRONCHOSCOPY WITH FLUORO;  Surgeon: [name], MD;  Location: [facility];  Service: Cardiopulmonary;  Laterality: Bilateral;  . Eye surgery      rdial keratotomy  . Video assisted thoracoscopy (vats)/wedge resection Left 09/01/2014    Procedure: VIDEO ASSISTED THORACOSCOPY (VATS)/WEDGE RESECTION;  Surgeon: [name], MD;  Location: [place];  Service: Thoracic;  Laterality: Left;    ROS- all systems are reviewed and negatives except as per HPI above  Current Outpatient Prescriptions  Medication Sig Dispense Refill  . aspirin 81 MG tablet Take 81 mg by mouth daily.    . calcitRIOL (ROCALTROL) 0.5 MCG capsule Take 0.5 mcg by mouth once a week. Monday    . Cholecalciferol (VITAMIN D3) 3000 UNITS TABS Take 3,000 Units by mouth daily.    . cinacalcet (SENSIPAR)  30 MG tablet Take 30 mg by mouth daily.    . colchicine 0.6 MG tablet Take 1 tablet (0.6 mg total) by mouth daily. 30 tablet 0  . epoetin alfa (EPOGEN,PROCRIT) 60454 UNIT/ML injection Inject 10,000 Units into the skin every 7 (seven) days.    . ferrous fumarate (HEMOCYTE - 106 MG FE) 325 (106 FE) MG TABS tablet Take 1 tablet by mouth daily.     Marland Kitchen gentamicin cream (GARAMYCIN) 0.1 % Apply 1 application topically 3 (three) times daily.     Marland Kitchen lanthanum (FOSRENOL) 1000 MG chewable tablet Chew 1,000 mg by mouth 3 (three) times daily with meals.    Marland Kitchen levothyroxine (SYNTHROID, LEVOTHROID) 50 MCG tablet Take 50 mcg by mouth daily before breakfast.    .  metoCLOPramide (REGLAN) 5 MG tablet Take 5 mg by mouth daily.     . metoprolol tartrate (LOPRESSOR) 25 MG tablet Take 0.5 tablets (12.5 mg total) by mouth 2 (two) times daily. 30 tablet 0  . OVER THE COUNTER MEDICATION Take 2 capsules by mouth 3 (three) times daily. Juice plus capsules     No current facility-administered medications for this visit.    Physical Exam: Filed Vitals:   08/21/15 1511  BP: 114/82  Pulse: 62  Height: 5\' 6"  (1.676 m)  Weight: 140 lb 9.6 oz (63.776 kg)    GEN- The patient is well appearing, alert and oriented x 3 today.   Head- normocephalic, atraumatic Eyes-  Sclera clear, conjunctiva pink Ears- hearing intact Oropharynx- clear Lungs- Clear to ausculation bilaterally, normal work of breathing Heart- Regular rate and rhythm, no murmurs, rubs or gallops, PMI not laterally displaced GI- soft, NT, ND, + BS Extremities- no clubbing, cyanosis, or edema  Assessment and Plan:  1. Pericarditis Resolving Echo reviewed with patient  2. afib All episodes < 1 min and in the setting of pericarditis She is wearing an event monitor.  I have reviewed strips which currently reveal only sinus rhythm chads2vasc is 2 (female, HTN)--> she says she does not have diabetes. Would not recommend anticoagulation unless she is found to have a high AF burden in the future. Stop metoprolol  3. Cp Mostly resolved myoview already ordered by hospital team  No further workup planned if above is normal Return to see me in 6 months  Thompson Grayer MD, [facility] 08/21/2015 3:36 PM

## 2015-08-21 NOTE — Patient Instructions (Signed)

## 2015-08-23 ENCOUNTER — Telehealth (HOSPITAL_COMMUNITY): Payer: Self-pay

## 2015-08-23 NOTE — Telephone Encounter (Signed)
Patient given detailed instructions per Myocardial Perfusion Study Information Sheet for the test on 08-25-2015 at 0815. Patient notified to arrive 15 minutes early and that it is imperative to arrive on time for appointment to keep from having the test rescheduled.  If you need to cancel or reschedule your appointment, please call the office within 24 hours of your appointment. Failure to do so may result in a cancellation of your appointment, and a $50 no show fee. Patient verbalized understanding.Odis Hollingshead, RN

## 2015-08-25 ENCOUNTER — Ambulatory Visit (HOSPITAL_COMMUNITY): Payer: BLUE CROSS/BLUE SHIELD | Attending: Cardiovascular Disease

## 2015-08-25 DIAGNOSIS — I10 Essential (primary) hypertension: Secondary | ICD-10-CM | POA: Diagnosis not present

## 2015-08-25 DIAGNOSIS — R0609 Other forms of dyspnea: Secondary | ICD-10-CM | POA: Insufficient documentation

## 2015-08-25 DIAGNOSIS — R002 Palpitations: Secondary | ICD-10-CM | POA: Diagnosis not present

## 2015-08-25 DIAGNOSIS — R079 Chest pain, unspecified: Secondary | ICD-10-CM | POA: Insufficient documentation

## 2015-08-25 LAB — MYOCARDIAL PERFUSION IMAGING
CHL CUP NUCLEAR SRS: 3
CHL CUP RESTING HR STRESS: 60 {beats}/min
LVDIAVOL: 51 mL
LVSYSVOL: 10 mL
NUC STRESS TID: 0.76
Peak HR: 98 {beats}/min
RATE: 0.4
SDS: 10
SSS: 13

## 2015-08-25 MED ORDER — TECHNETIUM TC 99M SESTAMIBI GENERIC - CARDIOLITE
10.2000 | Freq: Once | INTRAVENOUS | Status: AC | PRN
  Administered 2015-08-25: 10 via INTRAVENOUS

## 2015-08-25 MED ORDER — TECHNETIUM TC 99M SESTAMIBI GENERIC - CARDIOLITE
30.3000 | Freq: Once | INTRAVENOUS | Status: AC | PRN
Start: 2015-08-25 — End: 2015-08-25
  Administered 2015-08-25: 30.3 via INTRAVENOUS

## 2015-08-25 MED ORDER — REGADENOSON 0.4 MG/5ML IV SOLN
0.4000 mg | Freq: Once | INTRAVENOUS | Status: AC
  Administered 2015-08-25: 0.4 mg via INTRAVENOUS

## 2015-09-15 ENCOUNTER — Encounter: Payer: Self-pay | Admitting: Internal Medicine

## 2015-09-15 ENCOUNTER — Ambulatory Visit (INDEPENDENT_AMBULATORY_CARE_PROVIDER_SITE_OTHER): Payer: BLUE CROSS/BLUE SHIELD | Admitting: Internal Medicine

## 2015-09-15 VITALS — BP 112/64 | HR 79 | Ht 66.0 in | Wt 135.0 lb

## 2015-09-15 DIAGNOSIS — R918 Other nonspecific abnormal finding of lung field: Secondary | ICD-10-CM

## 2015-09-15 DIAGNOSIS — R059 Cough, unspecified: Secondary | ICD-10-CM

## 2015-09-15 DIAGNOSIS — R05 Cough: Secondary | ICD-10-CM

## 2015-09-15 DIAGNOSIS — J45991 Cough variant asthma: Secondary | ICD-10-CM | POA: Diagnosis not present

## 2015-09-15 DIAGNOSIS — R053 Chronic cough: Secondary | ICD-10-CM

## 2015-09-15 LAB — PULMONARY FUNCTION TEST
DL/VA % pred: 72 %
DL/VA: 3.63 ml/min/mmHg/L
DLCO UNC % PRED: 58 %
DLCO UNC: 15.73 ml/min/mmHg
FEF 25-75 PRE: 0.53 L/s
FEF 25-75 Post: 0.83 L/sec
FEF2575-%Change-Post: 56 %
FEF2575-%PRED-PRE: 21 %
FEF2575-%Pred-Post: 33 %
FEV1-%Change-Post: 19 %
FEV1-%PRED-POST: 50 %
FEV1-%Pred-Pre: 42 %
FEV1-POST: 1.41 L
FEV1-Pre: 1.18 L
FEV1FVC-%Change-Post: 1 %
FEV1FVC-%Pred-Pre: 66 %
FEV6-%CHANGE-POST: 17 %
FEV6-%PRED-POST: 75 %
FEV6-%PRED-PRE: 64 %
FEV6-PRE: 2.23 L
FEV6-Post: 2.62 L
FEV6FVC-%CHANGE-POST: 0 %
FEV6FVC-%PRED-POST: 101 %
FEV6FVC-%PRED-PRE: 100 %
FVC-%CHANGE-POST: 17 %
FVC-%Pred-Post: 74 %
FVC-%Pred-Pre: 63 %
FVC-Post: 2.67 L
FVC-Pre: 2.28 L
POST FEV6/FVC RATIO: 98 %
PRE FEV1/FVC RATIO: 52 %
Post FEV1/FVC ratio: 53 %
Pre FEV6/FVC Ratio: 98 %
RV % PRED: 178 %
RV: 3.67 L
TLC % PRED: 112 %
TLC: 6.03 L

## 2015-09-15 LAB — NITRIC OXIDE: NITRIC OXIDE: 16

## 2015-09-15 NOTE — Progress Notes (Signed)
PFT done today. 

## 2015-09-15 NOTE — Progress Notes (Signed)
Subjective:   Patient ID: Kimberly [name], female    DOB: [birth year]    MRN: [id number]    Brief patient profile:  59 yo never smoker female with ESRD (IgA nephropathy)  on peritoneal dialysis.    Previous renal transplant in 2011 that failed.  Seen for pulmonary consult in Oct 2015 for multiple pulmonary nodules with largest left apex measuring 4.1cm > met calcification on vats bx      History of Present Illness  06/26/2015 Follow up : Chronic cough  Seen for pulmonary consult in Oct 2015 for lung mass in left apex measuring 4.1cm  Underwent FOB with path/cx were unrevealing . She was referred to [facility] and underwent lung bx on 09/01/14 . 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 Has seen allergist and pulmonary in past . Tried cough meds, inhaler and allergy shots with no help. Worse with drinking and eating.  And At bedtime   Very aggravating , effects all her life.  Coughs to point she vomits. Takes reglan once daily   No known hx of macrodantin, amiodarone , or ACE inhibitor use.  Says dx w/ chronic bronchitis as child.  rec Begin Zantac 139m Twice daily   9 -9   Begin Zyrtec 114mAt bedtime  No better  Use sips of water to soothe throat and avoid throat clearing .  Delsym 2 tsp Twice daily  As needed  Cough. > no better  Tesslaon Three times a day  For cough . > no better     09/15/2015  f/u ov/Chirsty Armistead re: cough since age [age] allergy eval cats grass rec shots no better  Chief Complaint  Patient presents with  . Follow-up    PFT done today. Cough is unchanged and pt denies any new co's today.   while of prednisone for kidney cough seemed a lot better Cough so hard sev time a month she starts vomiting / cough is dry  Cough is worse hs and drinking water  reglan 5 mg each am seems to help some   No obvious day to day or daytime  variability or assoc chronic cough or cp or chest tightness, subjective wheeze or overt sinus or hb symptoms. No unusual exp hx or h/o childhood pna/ asthma or knowledge of premature birth.  Sleeping ok without nocturnal  or early am exacerbation  of respiratory  c/o's or need for noct saba. Also denies any obvious fluctuation of symptoms with weather or environmental changes or other aggravating or alleviating factors except as outlined above   Current Medications, Allergies, Complete Past Medical History, Past Surgical History, Family History, and Social History were reviewed in [employer].  ROS  The following are not active complaints unless bolded sore throat, dysphagia, dental problems, itching, sneezing,  nasal congestion or excess/ purulent secretions, ear ache,   fever, chills, sweats, unintended wt loss, classically pleuritic or exertional cp, hemoptysis,  orthopnea pnd or leg swelling, presyncope, palpitations, abdominal pain, anorexia, nausea, vomiting, diarrhea  or change in bowel or bladder habits, change in stools or urine, dysuria,hematuria,  rash, arthralgias, visual complaints, headache, numbness, weakness or ataxia or problems with walking or coordination,  change in mood/affect or memory.                  Objective:   Physical  Exam  GEN: A/Ox3; pleasant , NAD, well nourished    Wt Readings from Last 3 Encounters:  09/15/15 135 lb (61.236 kg)  08/21/15 140 lb 9.6 oz (63.776 kg)  08/11/15 137 lb 9.6 oz (62.415 kg)    Vital signs reviewed     HEENT:  /AT,  EACs-clear, TMs-wnl, NOSE-clear, THROAT-clear, no lesions, no postnasal drip or exudate noted.   NECK:  Supple w/ fair ROM; no JVD; normal carotid impulses w/o bruits; no thyromegaly or nodules palpated; no lymphadenopathy.  RESP Pos wheeze/ cough on breathing outno accessory muscle use, no dullness to percussion  CARD:  RRR, no m/r/g  , no peripheral edema, pulses intact, no  cyanosis or clubbing.  GI:   Soft & nt; nml bowel sounds; no organomegaly or masses detected.  Musco: Warm bil, no deformities or joint swelling noted.   Neuro: alert, no focal deficits noted.    Skin: Warm, no lesions or rashes    I personally reviewed images and agree with radiology impression as follows:  CXR:  08/04/15 1. Increased bronchial markings in the lower lungs when compared to the prior chest radiograph. This suggests acute on chronic bronchitis in the proper clinical setting. 2. No evidence of pneumonia. No pulmonary edema.   11/16/07  Dg Es  Free reflux to the level of the cervical esophagus.   Assessment & Plan:

## 2015-09-15 NOTE — Patient Instructions (Addendum)
Add reglan 5 mg at bedtime to pepcid 20 mg at bedtime and chlorpheniramine 4 mg x 2 to see to what extent it helps your night time cough first   For drainage / throat tickle try take CHLORPHENIRAMINE  4 mg - take one every 4 hours as needed - available over the counter- may cause drowsiness so start with just a bedtime dose or two and see how you tolerate it before trying in daytime    GERD (REFLUX)  is an extremely common cause of respiratory symptoms just like yours , many times with no obvious heartburn at all.    It can be treated with medication, but also with lifestyle changes including elevation of the head of your bed (ideally with 6 inch  bed blocks),  Smoking cessation, avoidance of late meals, excessive alcohol, and avoid fatty foods, chocolate, peppermint, colas, red wine, and acidic juices such as orange juice.  NO MINT OR MENTHOL PRODUCTS SO NO COUGH DROPS  USE SUGARLESS CANDY INSTEAD (Jolley ranchers or Stover's or Life Savers) or even ice chips will also do - the key is to swallow to prevent all throat clearing. NO OIL BASED VITAMINS - use powdered substitutes.    Please schedule a follow up office visit in 4 weeks, sooner if needed - with all medications in hand - if not better  we may need to send you to our GI expert in reflux Dr Percell Boston

## 2015-09-16 ENCOUNTER — Encounter: Payer: Self-pay | Admitting: Internal Medicine

## 2015-09-16 NOTE — Assessment & Plan Note (Addendum)
Sinus CT   11/12 /16 neg  - rec trial of hs reglan, h2 and 1st gen H1 focusing on noct symptoms > See instructions for specific recommendations which were reviewed directly with the patient who was given a copy with highlighter outlining the key components.

## 2015-09-16 NOTE — Assessment & Plan Note (Signed)
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 I reviewed this with her again today. Mainly, this is a complication of chronic renal failure and not a primary lung problem. Advised her  to discuss in more detail with her nephrologist, Dr. Clover Mealy

## 2015-09-16 NOTE — Assessment & Plan Note (Addendum)
Dg Es 11/16/2007 >  Free reflux to the level of the cervical esophagus  - NO 09/15/2015  = 16 - PFT's  09/15/2015   FEV1 1.41 (50 % ) ratio 53  p 19 % improvement from saba with DLCO  58 % corrects to 72 % for alv volume    Her PFTs suggest that she has severe chronic asthma but has never had wheezing with a very low NO and has documented severe reflux so I recommended we try to treat the reflux more aggressively  first to see what extent her main symptom, nocturnal cough, improves, and then regroup.  The problem with empirically starting inhalers is that they tend to aggravate the upper airway and so until we have this problem under control it's unlikely that we'll be able to help her by this approach.  rec instead first add reglan at hs and see what difference this makes and consider GI eval next   I had an extended discussion with the patient reviewing all relevant studies completed to date and  lasting 15 to 20 minutes of a 25 minute visit    Each maintenance medication was reviewed in detail including most importantly the difference between maintenance and prns and under what circumstances the prns are to be triggered using an action plan format that is not reflected in the computer generated alphabetically organized AVS.    Please see instructions for details which were reviewed in writing and the patient given a copy highlighting the part that I personally wrote and discussed at today's ov.

## 2015-09-19 ENCOUNTER — Other Ambulatory Visit: Payer: Self-pay | Admitting: Physician Assistant

## 2015-09-19 DIAGNOSIS — I480 Paroxysmal atrial fibrillation: Secondary | ICD-10-CM

## 2015-10-16 ENCOUNTER — Ambulatory Visit (INDEPENDENT_AMBULATORY_CARE_PROVIDER_SITE_OTHER): Payer: BLUE CROSS/BLUE SHIELD | Admitting: Internal Medicine

## 2015-10-16 ENCOUNTER — Encounter: Payer: Self-pay | Admitting: Internal Medicine

## 2015-10-16 VITALS — BP 102/60 | HR 92 | Ht 66.0 in | Wt 136.0 lb

## 2015-10-16 DIAGNOSIS — J45991 Cough variant asthma: Secondary | ICD-10-CM | POA: Diagnosis not present

## 2015-10-16 DIAGNOSIS — K219 Gastro-esophageal reflux disease without esophagitis: Secondary | ICD-10-CM

## 2015-10-16 MED ORDER — PREDNISONE 10 MG PO TABS: ORAL_TABLET | ORAL | Status: DC

## 2015-10-16 NOTE — Progress Notes (Signed)
Subjective:   Patient ID: Kimberly [name], female    DOB: October 28, 1955    MRN: [id number]    Brief patient profile:  60 yo never smoker female with ESRD (IgA nephropathy)  on peritoneal dialysis.    Previous renal transplant in 2011 that failed.  Seen for pulmonary consult in Oct 2015 for multiple pulmonary nodules with largest left apex measuring 4.1cm > met calcification on vats bx      History of Present Illness  06/26/2015 Follow up : Chronic cough  Seen for pulmonary consult in Oct 2015 for lung mass in left apex measuring 4.1cm  Underwent FOB with path/cx were unrevealing . She was referred to [facility] and underwent lung bx on 09/01/14 . 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 Has seen allergist and pulmonary in past . Tried cough meds, inhaler and allergy shots with no help. Worse with drinking and eating.  And At bedtime   Very aggravating , effects all her life.  Coughs to point she vomits. Takes reglan once daily   No known hx of macrodantin, amiodarone , or ACE inhibitor use.  Says dx w/ chronic bronchitis as child.  rec Begin Zantac 146m Twice daily   9 -9   Begin Zyrtec 162mAt bedtime  No better  Use sips of water to soothe throat and avoid throat clearing .  Delsym 2 tsp Twice daily  As needed  Cough. > no better  Tesslaon Three times a day  For cough . > no better     09/15/2015  f/u ov/Wert re: cough since age 60 allergy eval cats grass rec shots no better  Chief Complaint  Patient presents with  . Follow-up    PFT done today. Cough is unchanged and pt denies any new co's today.   while of prednisone for kidney cough seemed a lot better Cough so hard sev time a month she starts vomiting / cough is dry  Cough is worse hs and drinking water  reglan 5 mg each am seems to help some  rec Add reglan 5 mg at bedtime to  pepcid 20 mg at bedtime and chlorpheniramine 4 mg x 2 to see to what extent it helps your night time cough first  For drainage / throat tickle try take CHLORPHENIRAMINE  4 mg - take one every 4 hours as needed - available over the counter- may cause drowsiness so start with just a bedtime dose or two and see how you tolerate it before trying in daytime   GERD diet   10/16/2015  f/u ov/Wert re: cough since the age of [age]No better on max gerd rx/ 1st gen H1  Chief Complaint  Patient presents with  . Follow-up    Daytime cough is less, but still coughing at night. Cough is prod in the evening with clear sputum.   some doe steps but ok flat = MMRC1 = can walk nl pace, flat grade, can't hurry or go uphills or steps s sob    No obvious day to day or daytime variability or assoc excess/ purulent sputum or mucus plugs   cp or chest tightness, subjective wheeze or overt sinus or hb symptoms. No unusual exp hx or h/o childhood pna/ asthma or knowledge of premature birth.  Sleeping ok without nocturnal  or early am exacerbation  of respiratory  c/o's or need for noct saba. Also denies any obvious fluctuation of symptoms with weather or environmental changes or other aggravating or alleviating factors except as outlined above   Current Medications, Allergies, Complete Past Medical History, Past Surgical History, Family History, and Social History were reviewed in [employer] record.  ROS  The following are not active complaints unless bolded sore throat, dysphagia, dental problems, itching, sneezing,  nasal congestion or excess/ purulent secretions, ear ache,   fever, chills, sweats, unintended wt loss, classically pleuritic or exertional cp, hemoptysis,  orthopnea pnd or leg swelling, presyncope, palpitations, abdominal pain, anorexia, nausea, vomiting, diarrhea  or change in bowel or bladder habits, change in stools or urine, dysuria,hematuria,  rash, arthralgias, visual complaints,  headache, numbness, weakness or ataxia or problems with walking or coordination,  change in mood/affect or memory.                  Objective:   Physical Exam  GEN: A/Ox3; pleasant , NAD, well nourished    10/16/2015        136  09/15/15 135 lb (61.236 kg)  08/21/15 140 lb 9.6 oz (63.776 kg)  08/11/15 137 lb 9.6 oz (62.415 kg)    Vital signs reviewed   HEENT:  [place]/AT,  EACs-clear, TMs-wnl, NOSE-clear, THROAT-clear, no lesions, no postnasal drip or exudate noted.   NECK:  Supple w/ fair ROM; no JVD; normal carotid impulses w/o bruits; no thyromegaly or nodules palpated; no lymphadenopathy.  RESP Pos wheeze/ cough on breathing outno accessory muscle use, no dullness to percussion  CARD:  RRR, no m/r/g  , no peripheral edema, pulses intact, no cyanosis or clubbing.  GI:   Soft & nt; nml bowel sounds; no organomegaly or masses detected.  Musco: Warm bil, no deformities or joint swelling noted.   Neuro: alert, no focal deficits noted.    Skin: Warm, no lesions or rashes    I personally reviewed images and agree with radiology impression as follows:  CXR:  08/04/15 1. Increased bronchial markings in the lower lungs when compared to the prior chest radiograph. This suggests acute on chronic bronchitis in the proper clinical setting. 2. No evidence of pneumonia. No pulmonary edema.   11/16/07  Dg Es  Free reflux to the level of the cervical esophagus.   Assessment & Plan:

## 2015-10-16 NOTE — Patient Instructions (Addendum)
Prednisone 10 mg take  4 each am x 2 days,   2 each am x 2 days,  1 each am x 2 days and stop  - call me back if there is a dramatic response  Please see patient coordinator before you leave today  to schedule refer to GI Dr Nyoka Cowden   Pulmonary follow up is as needed

## 2015-10-22 ENCOUNTER — Encounter: Payer: Self-pay | Admitting: Internal Medicine

## 2015-10-22 NOTE — Assessment & Plan Note (Addendum)
Dg Es 11/16/2007 >  Free reflux to the level of the cervical esophagus  - NO 09/15/2015  = 16 pointing away from allergic asthma/ cough variant - PFT's  09/15/2015   FEV1 1.41 (50 % ) ratio 53  p 19 % improvement from saba with DLCO  58 % corrects to 72 % for alv volume    pfts are suggestive of asthma and if so cough should improve with just a short course of prednisone but regardless needs review of options to treat the gerd that may be causing the asthma in the first place as does not have a typical allergy hx at all > referred to GI   I had an extended discussion with the patient reviewing all relevant studies completed to date and  lasting 15 to 20 minutes of a 25 minute visit    Each maintenance medication was reviewed in detail including most importantly the difference between maintenance and prns and under what circumstances the prns are to be triggered using an action plan format that is not reflected in the computer generated alphabetically organized AVS.    Please see instructions for details which were reviewed in writing and the patient given a copy highlighting the part that I personally wrote and discussed at today's ov.

## 2015-10-22 NOTE — Assessment & Plan Note (Signed)
Dg Es 11/16/2007 >  Free reflux to the level of the cervical esophagus> 10/16/2015 : referred to GI  In setting of refractory cough not resp to ppi/reglan and concern with longterm exposure

## 2015-11-15 ENCOUNTER — Other Ambulatory Visit: Payer: Self-pay | Admitting: Family Medicine

## 2015-11-15 ENCOUNTER — Other Ambulatory Visit (HOSPITAL_COMMUNITY)
Admission: RE | Admit: 2015-11-15 | Discharge: 2015-11-15 | Disposition: A | Discharge status: Home / Self Care | Payer: BLUE CROSS/BLUE SHIELD | Source: Ambulatory Visit | Attending: Family Medicine | Admitting: Family Medicine

## 2015-11-15 DIAGNOSIS — Z01419 Encounter for gynecological examination (general) (routine) without abnormal findings: Secondary | ICD-10-CM | POA: Insufficient documentation

## 2015-11-16 ENCOUNTER — Other Ambulatory Visit: Payer: Self-pay | Admitting: Family Medicine

## 2015-11-16 DIAGNOSIS — I729 Aneurysm of unspecified site: Secondary | ICD-10-CM

## 2015-11-20 LAB — CYTOLOGY - PAP

## 2015-11-28 ENCOUNTER — Other Ambulatory Visit: Payer: Self-pay | Admitting: Family Medicine

## 2015-11-28 ENCOUNTER — Ambulatory Visit
Admission: RE | Admit: 2015-11-28 | Discharge: 2015-11-28 | Disposition: A | Discharge status: Home / Self Care | Payer: BLUE CROSS/BLUE SHIELD | Source: Ambulatory Visit | Attending: Family Medicine | Admitting: Family Medicine

## 2015-11-28 DIAGNOSIS — I729 Aneurysm of unspecified site: Secondary | ICD-10-CM

## 2016-02-29 ENCOUNTER — Telehealth: Payer: Self-pay | Admitting: Internal Medicine

## 2016-02-29 NOTE — Telephone Encounter (Signed)
New Message  Pt stated she needs Echo to be done- wanted to speak w/ RN- Please call back and discuss.

## 2016-02-29 NOTE — Telephone Encounter (Signed)
Returned call to patient and let her know that the MD requesting the echo needs to fax over a order for the test to be done and why.  Can fax to [phone]

## 2016-03-01 ENCOUNTER — Telehealth: Payer: Self-pay | Admitting: Gastroenterology

## 2016-03-04 ENCOUNTER — Encounter: Payer: Self-pay | Admitting: Gastroenterology

## 2016-03-04 ENCOUNTER — Other Ambulatory Visit (HOSPITAL_COMMUNITY): Payer: Self-pay | Admitting: Nephrology

## 2016-03-04 DIAGNOSIS — Z01818 Encounter for other preprocedural examination: Secondary | ICD-10-CM

## 2016-03-04 NOTE — Telephone Encounter (Signed)
Dr. Silverio Decamp reviewed records and has accepted patient. Ok to schedule Direct Colon but patient wants to be seen in the office for GERD. Appointment scheduled.

## 2016-03-20 ENCOUNTER — Other Ambulatory Visit: Payer: Self-pay

## 2016-03-20 ENCOUNTER — Ambulatory Visit (HOSPITAL_COMMUNITY): Payer: BLUE CROSS/BLUE SHIELD | Attending: Cardiology

## 2016-03-20 DIAGNOSIS — I119 Hypertensive heart disease without heart failure: Secondary | ICD-10-CM | POA: Insufficient documentation

## 2016-03-20 DIAGNOSIS — R188 Other ascites: Secondary | ICD-10-CM | POA: Insufficient documentation

## 2016-03-20 DIAGNOSIS — I071 Rheumatic tricuspid insufficiency: Secondary | ICD-10-CM | POA: Insufficient documentation

## 2016-03-20 DIAGNOSIS — E119 Type 2 diabetes mellitus without complications: Secondary | ICD-10-CM | POA: Diagnosis not present

## 2016-03-20 DIAGNOSIS — Z01818 Encounter for other preprocedural examination: Secondary | ICD-10-CM | POA: Diagnosis not present

## 2016-03-20 LAB — ECHOCARDIOGRAM COMPLETE
AOASC: 36 cm
CHL CUP RV SYS PRESS: 25 mmHg
CHL CUP TV REG PEAK VELOCITY: 236 cm/s
E decel time: 229 msec
E/e' ratio: 7.04
FS: 37 % (ref 28–44)
IVS/LV PW RATIO, ED: 1.1
LA vol A4C: 32 ml
LADIAMINDEX: 2 cm/m2
LASIZE: 34 mm
LDCA: 2.84 cm2
LEFT ATRIUM END SYS DIAM: 34 mm
LV E/e' medial: 7.04
LV E/e'average: 7.04
LV TDI E'LATERAL: 11.7
LV TDI E'MEDIAL: 8.87
LVELAT: 11.7 cm/s
LVOT VTI: 19.6 cm
LVOT peak grad rest: 4 mmHg
LVOTD: 19 mm
LVOTPV: 106 cm/s
LVOTSV: 56 mL
MV Dec: 229
MV Peak grad: 3 mmHg
MV pk A vel: 89.3 m/s
MV pk E vel: 82.4 m/s
PW: 10.3 mm — AB (ref 0.6–1.1)
RV LATERAL S' VELOCITY: 13.4 cm/s
TR max vel: 236 cm/s

## 2016-05-07 ENCOUNTER — Encounter: Payer: Self-pay | Admitting: Gastroenterology

## 2016-05-07 ENCOUNTER — Ambulatory Visit (INDEPENDENT_AMBULATORY_CARE_PROVIDER_SITE_OTHER): Payer: BLUE CROSS/BLUE SHIELD | Admitting: Gastroenterology

## 2016-05-07 VITALS — BP 84/60 | HR 68 | Ht 65.0 in | Wt 143.4 lb

## 2016-05-07 DIAGNOSIS — K219 Gastro-esophageal reflux disease without esophagitis: Secondary | ICD-10-CM

## 2016-05-07 DIAGNOSIS — R05 Cough: Secondary | ICD-10-CM

## 2016-05-07 DIAGNOSIS — R059 Cough, unspecified: Secondary | ICD-10-CM

## 2016-05-07 NOTE — Progress Notes (Signed)
Kimberly [name]    LZ:[id number]    1956/01/12  Primary Care Physician:[name],CANDACE [name], MD  Referring Physician: [name], MD [name], [name] [address]  Chief complaint:  Cough  HPI: 60 yr F with ESRD (Ig A nephropathy) s/p renal transplant rejection currently on peritoneal dialysis is here for evaluation of chronic cough. She was seen by Dr Melvyn Novas in Jan 2017. She has taken Zantac in the past with no improvement. She was given a prescription for PPI but doesn't think its helping either. Mostly her symptoms are worse during night. She wakes up in night with chocking sensation and pooling in the mouth. She also complained of choking on liquids when she tries to drink water rapidly. She currently follows with Dr. Melvyn Novas for chronic cough. She has multiple pulmonary nodules with largest in the left apex ~4cm s/p VATS 08/2014 biopsy which was consistent with incidental foci of carcinoid tumorlets, metastatic calcifications associated with prominent interstitial ossifications and myofibroblastic tissue response. Denies any solid food dysphagia, odynophagia, heartburn, regurgitation, nausea, vomiting, abdominal pain, melena or bright red blood per rectum. Weight is stable. She has evidence of free reflux to the level of cervical esophagus based on esophagram in February 2009    Outpatient Encounter Prescriptions as of 05/07/2016  Medication Sig  . aspirin 81 MG tablet Take 81 mg by mouth daily.  . calcitRIOL (ROCALTROL) 0.5 MCG capsule Take 0.5 mcg by mouth 3 (three) times a week. Monday  . chlorpheniramine (CHLOR-TRIMETON) 4 MG tablet Take 4 mg by mouth every 4 (four) hours as needed for allergies.  . Cholecalciferol (VITAMIN D3) 3000 UNITS TABS Take 3,000 Units by mouth daily.  . cinacalcet (SENSIPAR) 30 MG tablet Take 30 mg by mouth 2 (two) times daily.   . Digestive Enzymes (DIGESTIVE ENZYME PO) Take 1 capsule by mouth daily.  Marland Kitchen epoetin alfa  (EPOGEN,PROCRIT) 28413 UNIT/ML injection Inject 10,000 Units into the skin as needed (depending on hemoglobin).   . ferrous fumarate (HEMOCYTE - 106 MG FE) 325 (106 FE) MG TABS tablet Take 1 tablet by mouth daily.   Marland Kitchen gentamicin cream (GARAMYCIN) 0.1 % Apply 1 application topically 3 (three) times daily.   Marland Kitchen lanthanum (FOSRENOL) 1000 MG chewable tablet Chew 1,000 mg by mouth 3 (three) times daily with meals.  Marland Kitchen levothyroxine (SYNTHROID, LEVOTHROID) 50 MCG tablet Take 50 mcg by mouth daily before breakfast.  . metoCLOPramide (REGLAN) 5 MG tablet Take 5 mg by mouth daily.   . potassium chloride SA (K-DUR,KLOR-CON) 20 MEQ tablet Take 20 mEq by mouth daily.  . [DISCONTINUED] predniSONE (DELTASONE) 10 MG tablet Take  4 each am x 2 days,   2 each am x 2 days,  1 each am x 2 days and stop   No facility-administered encounter medications on file as of 05/07/2016.     Allergies as of 05/07/2016 - Review Complete 05/07/2016  Allergen Reaction Noted  . Other  08/21/2015  . Dapsone Rash 11/29/2011  . Pregabalin Rash and Other (See Comments) 01/17/2012  . Sulfonamide derivatives Rash   . Tramadol Rash 01/14/2014    Past Medical History:  Diagnosis Date  . Anemia   . Asthma   . Diabetes mellitus    medication induced with  steroids  . Diverticulosis   . First degree AV block   . GERD (gastroesophageal reflux disease)   . Heart murmur    child  . History of  chronic cough   . History of hiatal hernia   . History of hoarseness   . Hypertension    history; resolved on dialysis  . Hypothyroidism   . IgA nephropathy    on peritoneal dialysis  . Immunosuppression ([place])   . Kidney transplanted 01/2010   [facility], has had some rejection  . Patient on peritoneal dialysis ([place])   . Premature atrial beat   . Premature ventricular beat   . Renal transplant failure and rejection 2012  . Shingles 03/2011  . Stroke [facility]) October 2010, March 2013   cerebral aneurysm    Past Surgical History:    Procedure Laterality Date  . CAPD INSERTION  08/25/2008   open, Dr [name]  . CAPD INSERTION N/A 12/14/2013   Procedure: LAPAROSCOPIC INSERTION CONTINUOUS AMBULATORY PERITONEAL DIALYSIS  (CAPD) CATHETER;  Surgeon: [name], MD;  Location: [place];  Service: General;  Laterality: N/A;  . CAPD REMOVAL  03/22/2010   Dr [name]  . EYE SURGERY Bilateral    radial keratotomy  . INSERTION OF MESH N/A 12/14/2013   Procedure: INSERTION OF MESH;  Surgeon: [name], MD;  Location: [place];  Service: General;  Laterality: N/A;  . KIDNEY TRANSPLANT Right 01/2010   [place]  . LAPAROSCOPIC LYSIS OF ADHESIONS N/A 12/14/2013   Procedure: LAPAROSCOPIC LYSIS OF ADHESIONS;  Surgeon: [name], MD;  Location: [place];  Service: General;  Laterality: N/A;  . UMBILICAL HERNIA REPAIR  10/25/2009   open with mesh,  Dr [name]  . URETER REVISION  04/2011   [facility]  . VENTRAL HERNIA REPAIR N/A 12/14/2013   Procedure: LAPAROSCOPIC VENTRAL HERNIA;  Surgeon: [name], MD;  Location: [place];  Service: General;  Laterality: N/A;  . VIDEO ASSISTED THORACOSCOPY (VATS)/WEDGE RESECTION Left 09/01/2014   Procedure: VIDEO ASSISTED THORACOSCOPY (VATS)/WEDGE RESECTION;  Surgeon: [name], MD;  Location: [place];  Service: Thoracic;  Laterality: Left;  Marland Kitchen VIDEO BRONCHOSCOPY Bilateral 07/05/2014   Procedure: VIDEO BRONCHOSCOPY WITH FLUORO;  Surgeon: [name], MD;  Location: [facility];  Service: Cardiopulmonary;  Laterality: Bilateral;    Family History  Problem Relation Age of Onset  . Coronary artery disease Mother   . Emphysema Mother     smoked  . Ovarian cancer Mother   . Heart disease Mother   . Stroke Maternal Uncle   . Diabetes Maternal Uncle   . Kidney disease Maternal Uncle   . Liver disease Father     Social History   Social History  . Marital status: Married    Spouse name: N/A  . Number of children: 1  . Years of education: N/A   Occupational History  . retired     Social History Main Topics  . Smoking status: Never Smoker  . Smokeless tobacco: Never Used  . Alcohol use Yes     Comment: ocassional  . Drug use: No  . Sexual activity: Yes    Birth control/ protection: Post-menopausal   Other Topics Concern  . Not on file   Social History Narrative   Pt lives in [place].  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      Review of systems: Review of Systems  Constitutional: Negative for fever and chills.  HENT: Negative.   positive for nasal drip Eyes: Negative for blurred vision.  Respiratory: Positive for cough, shortness of breath and wheezing.   Cardiovascular: Negative for chest pain and palpitations.  Gastrointestinal: as per  HPI Genitourinary: Negative for dysuria, urgency, frequency and hematuria.  Musculoskeletal: Negative for myalgias, back pain and joint pain.  Skin: Negative for itching and rash.  Neurological: Negative for dizziness, tremors, focal weakness, seizures and loss of consciousness.  Endo/Heme/Allergies: Positive for environmental allergies.  Psychiatric/Behavioral: Negative for depression, suicidal ideas and hallucinations.  All other systems reviewed and are negative.   Physical Exam: Vitals:   05/07/16 1359  BP: (!) 84/60  Pulse: 68   Gen:      No acute distress HEENT:  EOMI, sclera anicteric Neck:     No masses; no thyromegaly Lungs:    Clear to auscultation R, decreased breath sounds and +crackles L; normal respiratory effort CV:         Regular rate and rhythm; no murmurs Abd:      + bowel sounds; soft, non-tender; no palpable masses, no distension, Peritoneal dialysis catheter Ext:    No edema; adequate peripheral perfusion Skin:      Warm and dry; no rash Neuro: alert and oriented x 3 Psych: normal mood and affect  Data Reviewed:  Reviewed chart in epic   Assessment and Plan/Recommendations:  60 year old female with end-stage renal disease on peritoneal dialysis,  currently listed for repeat renal transplant s/p rejection, multiple left lung nodules s/p VATS biopsy consistent with diffuse interstitial ossification  with complaints of chronic cough, likely is multifactorial She is reluctant to take PPI and is currently not on it Encourage patient to restart PPI daily 30 minutes before breakfast and Zantac at bedtime We'll obtain modified barium swallow to evaluate for oropharyngeal dysphagia and possible aspiration triggering cough If patient continues to be reluctant to take PPI, will discuss possible Nissen fundoplication at next visit and obtain esophageal manometry prior to referral for fundoplication.  Encourage patient to avoid frequent throat clearing and coughing to clear the throat. Also advised her to try to suppress cough with behavior modification She also reports seasonal allergies, deviated nasal septum and postnasal drip, has taken antihistamines with no significant change Will send referral to ENT for evaluation Due for recall colonoscopy in 2018 Greater than 50% of the time used for counseling as well as treatment plan and follow-up. She had multiple questions which were answered to her satisfaction  Damaris Hippo , MD [phone] Mon-Fri 8a-5p [phone] after 5p, weekends, holidays  CC: [name], MD Christinia Gully, MD

## 2016-05-07 NOTE — Patient Instructions (Signed)
You have been scheduled for a modified barium swallow on 05/13/2016 at 1pm. Please arrive 15 minutes prior to your test for registration. You will go to [facility] Radiology (1st Floor) for your appointment. Please refrain from eating or drinking anything 4 hours prior to your test. Should you need to cancel or reschedule your appointment, please contact [phone] [facility]) or [phone] [place]). _____________________________________________________________________ A Modified Barium Swallow Study, or MBS, is a special x-ray that is taken to check swallowing skills. It is carried out by a Stage manager and a Psychologist, clinical (SLP). During this test, yourmouth, throat, and esophagus, a muscular tube which connects your mouth to your stomach, is checked. The test will help you, your doctor, and the SLP plan what types of foods and liquids are easier for you to swallow. The SLP will also identify positions and ways to help you swallow more easily and safely. What will happen during an MBS? You will be taken to an x-ray room and seated comfortably. You will be asked to swallow small amounts of food and liquid mixed with barium. Barium is a liquid or paste that allows images of your mouth, throat and esophagus to be seen on x-ray. The x-ray captures moving images of the food you are swallowing as it travels from your mouth through your throat and into your esophagus. This test helps identify whether food or liquid is entering your lungs (aspiration). The test also shows which part of your mouth or throat lacks strength or coordination to move the food or liquid in the right direction. This test typically takes 30 minutes to 1 hour to complete. _______________________________________________________________________  We will refer you to [facility] and contact you with that appointment

## 2016-05-08 ENCOUNTER — Telehealth: Payer: Self-pay | Admitting: *Deleted

## 2016-05-08 ENCOUNTER — Other Ambulatory Visit (HOSPITAL_COMMUNITY): Payer: Self-pay | Admitting: Gastroenterology

## 2016-05-08 DIAGNOSIS — R1310 Dysphagia, unspecified: Secondary | ICD-10-CM

## 2016-05-08 NOTE — Telephone Encounter (Signed)
Dr Silverio Decamp, Ill complete this referral for [facility] ENT once the note is completed. I have to fax them your office note to schedule  Thanks

## 2016-05-09 NOTE — Telephone Encounter (Signed)
Done, thanks

## 2016-05-10 NOTE — Telephone Encounter (Signed)
Patient is scheduled with Dr Janace Hoard at [facility]NT on 05/24/2016 at [place] records today  L/M for patient

## 2016-05-13 ENCOUNTER — Ambulatory Visit (HOSPITAL_COMMUNITY): Payer: BLUE CROSS/BLUE SHIELD

## 2016-05-14 ENCOUNTER — Telehealth: Payer: Self-pay | Admitting: Gastroenterology

## 2016-05-15 ENCOUNTER — Ambulatory Visit (HOSPITAL_COMMUNITY)
Admission: RE | Admit: 2016-05-15 | Discharge: 2016-05-15 | Disposition: A | Discharge status: Home / Self Care | Payer: BLUE CROSS/BLUE SHIELD | Source: Ambulatory Visit | Attending: Gastroenterology | Admitting: Gastroenterology

## 2016-05-15 DIAGNOSIS — R05 Cough: Secondary | ICD-10-CM | POA: Insufficient documentation

## 2016-05-15 DIAGNOSIS — R1310 Dysphagia, unspecified: Secondary | ICD-10-CM

## 2016-05-15 DIAGNOSIS — R059 Cough, unspecified: Secondary | ICD-10-CM

## 2016-05-15 NOTE — Telephone Encounter (Signed)
Called patient to contact [facility] to reschedule her appointment to a time she could be there. She said she would call them to reschedule

## 2016-05-21 ENCOUNTER — Other Ambulatory Visit: Payer: Self-pay

## 2016-05-29 DIAGNOSIS — K219 Gastro-esophageal reflux disease without esophagitis: Secondary | ICD-10-CM | POA: Insufficient documentation

## 2016-05-29 DIAGNOSIS — R053 Chronic cough: Secondary | ICD-10-CM | POA: Insufficient documentation

## 2016-05-29 DIAGNOSIS — R05 Cough: Secondary | ICD-10-CM | POA: Insufficient documentation

## 2016-07-24 DIAGNOSIS — K769 Liver disease, unspecified: Secondary | ICD-10-CM | POA: Diagnosis not present

## 2016-07-24 DIAGNOSIS — E46 Unspecified protein-calorie malnutrition: Secondary | ICD-10-CM | POA: Diagnosis not present

## 2016-07-24 DIAGNOSIS — Z79899 Other long term (current) drug therapy: Secondary | ICD-10-CM | POA: Diagnosis not present

## 2016-07-24 DIAGNOSIS — R8299 Other abnormal findings in urine: Secondary | ICD-10-CM | POA: Diagnosis not present

## 2016-07-24 DIAGNOSIS — N186 End stage renal disease: Secondary | ICD-10-CM | POA: Diagnosis not present

## 2016-07-24 DIAGNOSIS — D509 Iron deficiency anemia, unspecified: Secondary | ICD-10-CM | POA: Diagnosis not present

## 2016-07-24 DIAGNOSIS — D631 Anemia in chronic kidney disease: Secondary | ICD-10-CM | POA: Diagnosis not present

## 2016-07-24 DIAGNOSIS — N2589 Other disorders resulting from impaired renal tubular function: Secondary | ICD-10-CM | POA: Diagnosis not present

## 2016-07-25 DIAGNOSIS — D631 Anemia in chronic kidney disease: Secondary | ICD-10-CM | POA: Diagnosis not present

## 2016-07-25 DIAGNOSIS — Z79899 Other long term (current) drug therapy: Secondary | ICD-10-CM | POA: Diagnosis not present

## 2016-07-25 DIAGNOSIS — N2589 Other disorders resulting from impaired renal tubular function: Secondary | ICD-10-CM | POA: Diagnosis not present

## 2016-07-25 DIAGNOSIS — N186 End stage renal disease: Secondary | ICD-10-CM | POA: Diagnosis not present

## 2016-07-25 DIAGNOSIS — K769 Liver disease, unspecified: Secondary | ICD-10-CM | POA: Diagnosis not present

## 2016-07-25 DIAGNOSIS — D509 Iron deficiency anemia, unspecified: Secondary | ICD-10-CM | POA: Diagnosis not present

## 2016-07-26 DIAGNOSIS — D631 Anemia in chronic kidney disease: Secondary | ICD-10-CM | POA: Diagnosis not present

## 2016-07-26 DIAGNOSIS — N2589 Other disorders resulting from impaired renal tubular function: Secondary | ICD-10-CM | POA: Diagnosis not present

## 2016-07-26 DIAGNOSIS — Z79899 Other long term (current) drug therapy: Secondary | ICD-10-CM | POA: Diagnosis not present

## 2016-07-26 DIAGNOSIS — D509 Iron deficiency anemia, unspecified: Secondary | ICD-10-CM | POA: Diagnosis not present

## 2016-07-26 DIAGNOSIS — K769 Liver disease, unspecified: Secondary | ICD-10-CM | POA: Diagnosis not present

## 2016-07-26 DIAGNOSIS — N186 End stage renal disease: Secondary | ICD-10-CM | POA: Diagnosis not present

## 2016-07-27 DIAGNOSIS — K769 Liver disease, unspecified: Secondary | ICD-10-CM | POA: Diagnosis not present

## 2016-07-27 DIAGNOSIS — Z79899 Other long term (current) drug therapy: Secondary | ICD-10-CM | POA: Diagnosis not present

## 2016-07-27 DIAGNOSIS — D509 Iron deficiency anemia, unspecified: Secondary | ICD-10-CM | POA: Diagnosis not present

## 2016-07-27 DIAGNOSIS — D631 Anemia in chronic kidney disease: Secondary | ICD-10-CM | POA: Diagnosis not present

## 2016-07-27 DIAGNOSIS — N186 End stage renal disease: Secondary | ICD-10-CM | POA: Diagnosis not present

## 2016-07-27 DIAGNOSIS — N2589 Other disorders resulting from impaired renal tubular function: Secondary | ICD-10-CM | POA: Diagnosis not present

## 2016-07-28 DIAGNOSIS — D509 Iron deficiency anemia, unspecified: Secondary | ICD-10-CM | POA: Diagnosis not present

## 2016-07-28 DIAGNOSIS — Z79899 Other long term (current) drug therapy: Secondary | ICD-10-CM | POA: Diagnosis not present

## 2016-07-28 DIAGNOSIS — N186 End stage renal disease: Secondary | ICD-10-CM | POA: Diagnosis not present

## 2016-07-28 DIAGNOSIS — D631 Anemia in chronic kidney disease: Secondary | ICD-10-CM | POA: Diagnosis not present

## 2016-07-28 DIAGNOSIS — N2589 Other disorders resulting from impaired renal tubular function: Secondary | ICD-10-CM | POA: Diagnosis not present

## 2016-07-28 DIAGNOSIS — K769 Liver disease, unspecified: Secondary | ICD-10-CM | POA: Diagnosis not present

## 2016-07-29 DIAGNOSIS — Z79899 Other long term (current) drug therapy: Secondary | ICD-10-CM | POA: Diagnosis not present

## 2016-07-29 DIAGNOSIS — K769 Liver disease, unspecified: Secondary | ICD-10-CM | POA: Diagnosis not present

## 2016-07-29 DIAGNOSIS — N186 End stage renal disease: Secondary | ICD-10-CM | POA: Diagnosis not present

## 2016-07-29 DIAGNOSIS — D509 Iron deficiency anemia, unspecified: Secondary | ICD-10-CM | POA: Diagnosis not present

## 2016-07-29 DIAGNOSIS — D631 Anemia in chronic kidney disease: Secondary | ICD-10-CM | POA: Diagnosis not present

## 2016-07-29 DIAGNOSIS — N2589 Other disorders resulting from impaired renal tubular function: Secondary | ICD-10-CM | POA: Diagnosis not present

## 2016-07-30 DIAGNOSIS — N2589 Other disorders resulting from impaired renal tubular function: Secondary | ICD-10-CM | POA: Diagnosis not present

## 2016-07-30 DIAGNOSIS — Z79899 Other long term (current) drug therapy: Secondary | ICD-10-CM | POA: Diagnosis not present

## 2016-07-30 DIAGNOSIS — D509 Iron deficiency anemia, unspecified: Secondary | ICD-10-CM | POA: Diagnosis not present

## 2016-07-30 DIAGNOSIS — D631 Anemia in chronic kidney disease: Secondary | ICD-10-CM | POA: Diagnosis not present

## 2016-07-30 DIAGNOSIS — K769 Liver disease, unspecified: Secondary | ICD-10-CM | POA: Diagnosis not present

## 2016-07-30 DIAGNOSIS — N186 End stage renal disease: Secondary | ICD-10-CM | POA: Diagnosis not present

## 2016-07-31 DIAGNOSIS — K769 Liver disease, unspecified: Secondary | ICD-10-CM | POA: Diagnosis not present

## 2016-07-31 DIAGNOSIS — Z79899 Other long term (current) drug therapy: Secondary | ICD-10-CM | POA: Diagnosis not present

## 2016-07-31 DIAGNOSIS — D509 Iron deficiency anemia, unspecified: Secondary | ICD-10-CM | POA: Diagnosis not present

## 2016-07-31 DIAGNOSIS — N2589 Other disorders resulting from impaired renal tubular function: Secondary | ICD-10-CM | POA: Diagnosis not present

## 2016-07-31 DIAGNOSIS — N186 End stage renal disease: Secondary | ICD-10-CM | POA: Diagnosis not present

## 2016-07-31 DIAGNOSIS — D631 Anemia in chronic kidney disease: Secondary | ICD-10-CM | POA: Diagnosis not present

## 2016-08-01 DIAGNOSIS — D509 Iron deficiency anemia, unspecified: Secondary | ICD-10-CM | POA: Diagnosis not present

## 2016-08-01 DIAGNOSIS — Z79899 Other long term (current) drug therapy: Secondary | ICD-10-CM | POA: Diagnosis not present

## 2016-08-01 DIAGNOSIS — N186 End stage renal disease: Secondary | ICD-10-CM | POA: Diagnosis not present

## 2016-08-01 DIAGNOSIS — D631 Anemia in chronic kidney disease: Secondary | ICD-10-CM | POA: Diagnosis not present

## 2016-08-01 DIAGNOSIS — K769 Liver disease, unspecified: Secondary | ICD-10-CM | POA: Diagnosis not present

## 2016-08-01 DIAGNOSIS — N2589 Other disorders resulting from impaired renal tubular function: Secondary | ICD-10-CM | POA: Diagnosis not present

## 2016-08-02 DIAGNOSIS — K769 Liver disease, unspecified: Secondary | ICD-10-CM | POA: Diagnosis not present

## 2016-08-02 DIAGNOSIS — N186 End stage renal disease: Secondary | ICD-10-CM | POA: Diagnosis not present

## 2016-08-02 DIAGNOSIS — Z79899 Other long term (current) drug therapy: Secondary | ICD-10-CM | POA: Diagnosis not present

## 2016-08-02 DIAGNOSIS — D509 Iron deficiency anemia, unspecified: Secondary | ICD-10-CM | POA: Diagnosis not present

## 2016-08-02 DIAGNOSIS — D631 Anemia in chronic kidney disease: Secondary | ICD-10-CM | POA: Diagnosis not present

## 2016-08-02 DIAGNOSIS — N2589 Other disorders resulting from impaired renal tubular function: Secondary | ICD-10-CM | POA: Diagnosis not present

## 2016-08-03 DIAGNOSIS — D509 Iron deficiency anemia, unspecified: Secondary | ICD-10-CM | POA: Diagnosis not present

## 2016-08-03 DIAGNOSIS — K769 Liver disease, unspecified: Secondary | ICD-10-CM | POA: Diagnosis not present

## 2016-08-03 DIAGNOSIS — N2589 Other disorders resulting from impaired renal tubular function: Secondary | ICD-10-CM | POA: Diagnosis not present

## 2016-08-03 DIAGNOSIS — N186 End stage renal disease: Secondary | ICD-10-CM | POA: Diagnosis not present

## 2016-08-03 DIAGNOSIS — Z79899 Other long term (current) drug therapy: Secondary | ICD-10-CM | POA: Diagnosis not present

## 2016-08-03 DIAGNOSIS — D631 Anemia in chronic kidney disease: Secondary | ICD-10-CM | POA: Diagnosis not present

## 2016-08-04 DIAGNOSIS — N186 End stage renal disease: Secondary | ICD-10-CM | POA: Diagnosis not present

## 2016-08-04 DIAGNOSIS — D509 Iron deficiency anemia, unspecified: Secondary | ICD-10-CM | POA: Diagnosis not present

## 2016-08-04 DIAGNOSIS — N2589 Other disorders resulting from impaired renal tubular function: Secondary | ICD-10-CM | POA: Diagnosis not present

## 2016-08-04 DIAGNOSIS — K769 Liver disease, unspecified: Secondary | ICD-10-CM | POA: Diagnosis not present

## 2016-08-04 DIAGNOSIS — Z79899 Other long term (current) drug therapy: Secondary | ICD-10-CM | POA: Diagnosis not present

## 2016-08-04 DIAGNOSIS — D631 Anemia in chronic kidney disease: Secondary | ICD-10-CM | POA: Diagnosis not present

## 2016-08-05 DIAGNOSIS — K769 Liver disease, unspecified: Secondary | ICD-10-CM | POA: Diagnosis not present

## 2016-08-05 DIAGNOSIS — Z79899 Other long term (current) drug therapy: Secondary | ICD-10-CM | POA: Diagnosis not present

## 2016-08-05 DIAGNOSIS — D509 Iron deficiency anemia, unspecified: Secondary | ICD-10-CM | POA: Diagnosis not present

## 2016-08-05 DIAGNOSIS — N2589 Other disorders resulting from impaired renal tubular function: Secondary | ICD-10-CM | POA: Diagnosis not present

## 2016-08-05 DIAGNOSIS — N186 End stage renal disease: Secondary | ICD-10-CM | POA: Diagnosis not present

## 2016-08-05 DIAGNOSIS — D631 Anemia in chronic kidney disease: Secondary | ICD-10-CM | POA: Diagnosis not present

## 2016-08-06 DIAGNOSIS — N2589 Other disorders resulting from impaired renal tubular function: Secondary | ICD-10-CM | POA: Diagnosis not present

## 2016-08-06 DIAGNOSIS — D509 Iron deficiency anemia, unspecified: Secondary | ICD-10-CM | POA: Diagnosis not present

## 2016-08-06 DIAGNOSIS — D631 Anemia in chronic kidney disease: Secondary | ICD-10-CM | POA: Diagnosis not present

## 2016-08-06 DIAGNOSIS — N186 End stage renal disease: Secondary | ICD-10-CM | POA: Diagnosis not present

## 2016-08-06 DIAGNOSIS — Z79899 Other long term (current) drug therapy: Secondary | ICD-10-CM | POA: Diagnosis not present

## 2016-08-06 DIAGNOSIS — K769 Liver disease, unspecified: Secondary | ICD-10-CM | POA: Diagnosis not present

## 2016-08-07 DIAGNOSIS — N2589 Other disorders resulting from impaired renal tubular function: Secondary | ICD-10-CM | POA: Diagnosis not present

## 2016-08-07 DIAGNOSIS — K769 Liver disease, unspecified: Secondary | ICD-10-CM | POA: Diagnosis not present

## 2016-08-07 DIAGNOSIS — N186 End stage renal disease: Secondary | ICD-10-CM | POA: Diagnosis not present

## 2016-08-07 DIAGNOSIS — D631 Anemia in chronic kidney disease: Secondary | ICD-10-CM | POA: Diagnosis not present

## 2016-08-07 DIAGNOSIS — D509 Iron deficiency anemia, unspecified: Secondary | ICD-10-CM | POA: Diagnosis not present

## 2016-08-07 DIAGNOSIS — Z79899 Other long term (current) drug therapy: Secondary | ICD-10-CM | POA: Diagnosis not present

## 2016-08-08 DIAGNOSIS — K769 Liver disease, unspecified: Secondary | ICD-10-CM | POA: Diagnosis not present

## 2016-08-08 DIAGNOSIS — N186 End stage renal disease: Secondary | ICD-10-CM | POA: Diagnosis not present

## 2016-08-08 DIAGNOSIS — Z79899 Other long term (current) drug therapy: Secondary | ICD-10-CM | POA: Diagnosis not present

## 2016-08-08 DIAGNOSIS — D509 Iron deficiency anemia, unspecified: Secondary | ICD-10-CM | POA: Diagnosis not present

## 2016-08-08 DIAGNOSIS — D631 Anemia in chronic kidney disease: Secondary | ICD-10-CM | POA: Diagnosis not present

## 2016-08-08 DIAGNOSIS — N2589 Other disorders resulting from impaired renal tubular function: Secondary | ICD-10-CM | POA: Diagnosis not present

## 2016-08-09 DIAGNOSIS — K769 Liver disease, unspecified: Secondary | ICD-10-CM | POA: Diagnosis not present

## 2016-08-09 DIAGNOSIS — D509 Iron deficiency anemia, unspecified: Secondary | ICD-10-CM | POA: Diagnosis not present

## 2016-08-09 DIAGNOSIS — N2589 Other disorders resulting from impaired renal tubular function: Secondary | ICD-10-CM | POA: Diagnosis not present

## 2016-08-09 DIAGNOSIS — D631 Anemia in chronic kidney disease: Secondary | ICD-10-CM | POA: Diagnosis not present

## 2016-08-09 DIAGNOSIS — N186 End stage renal disease: Secondary | ICD-10-CM | POA: Diagnosis not present

## 2016-08-09 DIAGNOSIS — Z79899 Other long term (current) drug therapy: Secondary | ICD-10-CM | POA: Diagnosis not present

## 2016-08-10 DIAGNOSIS — K769 Liver disease, unspecified: Secondary | ICD-10-CM | POA: Diagnosis not present

## 2016-08-10 DIAGNOSIS — Z79899 Other long term (current) drug therapy: Secondary | ICD-10-CM | POA: Diagnosis not present

## 2016-08-10 DIAGNOSIS — N186 End stage renal disease: Secondary | ICD-10-CM | POA: Diagnosis not present

## 2016-08-10 DIAGNOSIS — D509 Iron deficiency anemia, unspecified: Secondary | ICD-10-CM | POA: Diagnosis not present

## 2016-08-10 DIAGNOSIS — N2589 Other disorders resulting from impaired renal tubular function: Secondary | ICD-10-CM | POA: Diagnosis not present

## 2016-08-10 DIAGNOSIS — D631 Anemia in chronic kidney disease: Secondary | ICD-10-CM | POA: Diagnosis not present

## 2016-08-11 DIAGNOSIS — N186 End stage renal disease: Secondary | ICD-10-CM | POA: Diagnosis not present

## 2016-08-11 DIAGNOSIS — D509 Iron deficiency anemia, unspecified: Secondary | ICD-10-CM | POA: Diagnosis not present

## 2016-08-11 DIAGNOSIS — D631 Anemia in chronic kidney disease: Secondary | ICD-10-CM | POA: Diagnosis not present

## 2016-08-11 DIAGNOSIS — N2589 Other disorders resulting from impaired renal tubular function: Secondary | ICD-10-CM | POA: Diagnosis not present

## 2016-08-11 DIAGNOSIS — Z79899 Other long term (current) drug therapy: Secondary | ICD-10-CM | POA: Diagnosis not present

## 2016-08-11 DIAGNOSIS — K769 Liver disease, unspecified: Secondary | ICD-10-CM | POA: Diagnosis not present

## 2016-08-12 DIAGNOSIS — D631 Anemia in chronic kidney disease: Secondary | ICD-10-CM | POA: Diagnosis not present

## 2016-08-12 DIAGNOSIS — Z79899 Other long term (current) drug therapy: Secondary | ICD-10-CM | POA: Diagnosis not present

## 2016-08-12 DIAGNOSIS — N186 End stage renal disease: Secondary | ICD-10-CM | POA: Diagnosis not present

## 2016-08-12 DIAGNOSIS — K769 Liver disease, unspecified: Secondary | ICD-10-CM | POA: Diagnosis not present

## 2016-08-12 DIAGNOSIS — D509 Iron deficiency anemia, unspecified: Secondary | ICD-10-CM | POA: Diagnosis not present

## 2016-08-12 DIAGNOSIS — N2589 Other disorders resulting from impaired renal tubular function: Secondary | ICD-10-CM | POA: Diagnosis not present

## 2016-08-13 DIAGNOSIS — K769 Liver disease, unspecified: Secondary | ICD-10-CM | POA: Diagnosis not present

## 2016-08-13 DIAGNOSIS — Z79899 Other long term (current) drug therapy: Secondary | ICD-10-CM | POA: Diagnosis not present

## 2016-08-13 DIAGNOSIS — N2589 Other disorders resulting from impaired renal tubular function: Secondary | ICD-10-CM | POA: Diagnosis not present

## 2016-08-13 DIAGNOSIS — D509 Iron deficiency anemia, unspecified: Secondary | ICD-10-CM | POA: Diagnosis not present

## 2016-08-13 DIAGNOSIS — N186 End stage renal disease: Secondary | ICD-10-CM | POA: Diagnosis not present

## 2016-08-13 DIAGNOSIS — D631 Anemia in chronic kidney disease: Secondary | ICD-10-CM | POA: Diagnosis not present

## 2016-08-14 DIAGNOSIS — D631 Anemia in chronic kidney disease: Secondary | ICD-10-CM | POA: Diagnosis not present

## 2016-08-14 DIAGNOSIS — K769 Liver disease, unspecified: Secondary | ICD-10-CM | POA: Diagnosis not present

## 2016-08-14 DIAGNOSIS — N186 End stage renal disease: Secondary | ICD-10-CM | POA: Diagnosis not present

## 2016-08-14 DIAGNOSIS — N2589 Other disorders resulting from impaired renal tubular function: Secondary | ICD-10-CM | POA: Diagnosis not present

## 2016-08-14 DIAGNOSIS — Z79899 Other long term (current) drug therapy: Secondary | ICD-10-CM | POA: Diagnosis not present

## 2016-08-14 DIAGNOSIS — D509 Iron deficiency anemia, unspecified: Secondary | ICD-10-CM | POA: Diagnosis not present

## 2016-08-15 DIAGNOSIS — D631 Anemia in chronic kidney disease: Secondary | ICD-10-CM | POA: Diagnosis not present

## 2016-08-15 DIAGNOSIS — K769 Liver disease, unspecified: Secondary | ICD-10-CM | POA: Diagnosis not present

## 2016-08-15 DIAGNOSIS — N2589 Other disorders resulting from impaired renal tubular function: Secondary | ICD-10-CM | POA: Diagnosis not present

## 2016-08-15 DIAGNOSIS — D509 Iron deficiency anemia, unspecified: Secondary | ICD-10-CM | POA: Diagnosis not present

## 2016-08-15 DIAGNOSIS — N186 End stage renal disease: Secondary | ICD-10-CM | POA: Diagnosis not present

## 2016-08-15 DIAGNOSIS — Z79899 Other long term (current) drug therapy: Secondary | ICD-10-CM | POA: Diagnosis not present

## 2016-08-16 DIAGNOSIS — D631 Anemia in chronic kidney disease: Secondary | ICD-10-CM | POA: Diagnosis not present

## 2016-08-16 DIAGNOSIS — N186 End stage renal disease: Secondary | ICD-10-CM | POA: Diagnosis not present

## 2016-08-16 DIAGNOSIS — N2589 Other disorders resulting from impaired renal tubular function: Secondary | ICD-10-CM | POA: Diagnosis not present

## 2016-08-16 DIAGNOSIS — K769 Liver disease, unspecified: Secondary | ICD-10-CM | POA: Diagnosis not present

## 2016-08-16 DIAGNOSIS — Z79899 Other long term (current) drug therapy: Secondary | ICD-10-CM | POA: Diagnosis not present

## 2016-08-16 DIAGNOSIS — D509 Iron deficiency anemia, unspecified: Secondary | ICD-10-CM | POA: Diagnosis not present

## 2016-08-17 DIAGNOSIS — D509 Iron deficiency anemia, unspecified: Secondary | ICD-10-CM | POA: Diagnosis not present

## 2016-08-17 DIAGNOSIS — K769 Liver disease, unspecified: Secondary | ICD-10-CM | POA: Diagnosis not present

## 2016-08-17 DIAGNOSIS — N2589 Other disorders resulting from impaired renal tubular function: Secondary | ICD-10-CM | POA: Diagnosis not present

## 2016-08-17 DIAGNOSIS — Z79899 Other long term (current) drug therapy: Secondary | ICD-10-CM | POA: Diagnosis not present

## 2016-08-17 DIAGNOSIS — N186 End stage renal disease: Secondary | ICD-10-CM | POA: Diagnosis not present

## 2016-08-17 DIAGNOSIS — D631 Anemia in chronic kidney disease: Secondary | ICD-10-CM | POA: Diagnosis not present

## 2016-08-18 DIAGNOSIS — D631 Anemia in chronic kidney disease: Secondary | ICD-10-CM | POA: Diagnosis not present

## 2016-08-18 DIAGNOSIS — Z79899 Other long term (current) drug therapy: Secondary | ICD-10-CM | POA: Diagnosis not present

## 2016-08-18 DIAGNOSIS — N186 End stage renal disease: Secondary | ICD-10-CM | POA: Diagnosis not present

## 2016-08-18 DIAGNOSIS — K769 Liver disease, unspecified: Secondary | ICD-10-CM | POA: Diagnosis not present

## 2016-08-18 DIAGNOSIS — N2589 Other disorders resulting from impaired renal tubular function: Secondary | ICD-10-CM | POA: Diagnosis not present

## 2016-08-18 DIAGNOSIS — D509 Iron deficiency anemia, unspecified: Secondary | ICD-10-CM | POA: Diagnosis not present

## 2016-08-19 DIAGNOSIS — D509 Iron deficiency anemia, unspecified: Secondary | ICD-10-CM | POA: Diagnosis not present

## 2016-08-19 DIAGNOSIS — N186 End stage renal disease: Secondary | ICD-10-CM | POA: Diagnosis not present

## 2016-08-19 DIAGNOSIS — D631 Anemia in chronic kidney disease: Secondary | ICD-10-CM | POA: Diagnosis not present

## 2016-08-19 DIAGNOSIS — Z79899 Other long term (current) drug therapy: Secondary | ICD-10-CM | POA: Diagnosis not present

## 2016-08-19 DIAGNOSIS — K769 Liver disease, unspecified: Secondary | ICD-10-CM | POA: Diagnosis not present

## 2016-08-19 DIAGNOSIS — N2589 Other disorders resulting from impaired renal tubular function: Secondary | ICD-10-CM | POA: Diagnosis not present

## 2016-08-20 DIAGNOSIS — D631 Anemia in chronic kidney disease: Secondary | ICD-10-CM | POA: Diagnosis not present

## 2016-08-20 DIAGNOSIS — K769 Liver disease, unspecified: Secondary | ICD-10-CM | POA: Diagnosis not present

## 2016-08-20 DIAGNOSIS — Z79899 Other long term (current) drug therapy: Secondary | ICD-10-CM | POA: Diagnosis not present

## 2016-08-20 DIAGNOSIS — N186 End stage renal disease: Secondary | ICD-10-CM | POA: Diagnosis not present

## 2016-08-20 DIAGNOSIS — N2589 Other disorders resulting from impaired renal tubular function: Secondary | ICD-10-CM | POA: Diagnosis not present

## 2016-08-20 DIAGNOSIS — D509 Iron deficiency anemia, unspecified: Secondary | ICD-10-CM | POA: Diagnosis not present

## 2016-08-21 DIAGNOSIS — D509 Iron deficiency anemia, unspecified: Secondary | ICD-10-CM | POA: Diagnosis not present

## 2016-08-21 DIAGNOSIS — Z79899 Other long term (current) drug therapy: Secondary | ICD-10-CM | POA: Diagnosis not present

## 2016-08-21 DIAGNOSIS — D631 Anemia in chronic kidney disease: Secondary | ICD-10-CM | POA: Diagnosis not present

## 2016-08-21 DIAGNOSIS — N2589 Other disorders resulting from impaired renal tubular function: Secondary | ICD-10-CM | POA: Diagnosis not present

## 2016-08-21 DIAGNOSIS — N186 End stage renal disease: Secondary | ICD-10-CM | POA: Diagnosis not present

## 2016-08-21 DIAGNOSIS — K769 Liver disease, unspecified: Secondary | ICD-10-CM | POA: Diagnosis not present

## 2016-08-22 DIAGNOSIS — N186 End stage renal disease: Secondary | ICD-10-CM | POA: Diagnosis not present

## 2016-08-22 DIAGNOSIS — K769 Liver disease, unspecified: Secondary | ICD-10-CM | POA: Diagnosis not present

## 2016-08-22 DIAGNOSIS — N2589 Other disorders resulting from impaired renal tubular function: Secondary | ICD-10-CM | POA: Diagnosis not present

## 2016-08-22 DIAGNOSIS — Z992 Dependence on renal dialysis: Secondary | ICD-10-CM | POA: Diagnosis not present

## 2016-08-22 DIAGNOSIS — D509 Iron deficiency anemia, unspecified: Secondary | ICD-10-CM | POA: Diagnosis not present

## 2016-08-22 DIAGNOSIS — Z79899 Other long term (current) drug therapy: Secondary | ICD-10-CM | POA: Diagnosis not present

## 2016-08-22 DIAGNOSIS — D631 Anemia in chronic kidney disease: Secondary | ICD-10-CM | POA: Diagnosis not present

## 2016-08-22 DIAGNOSIS — T8612 Kidney transplant failure: Secondary | ICD-10-CM | POA: Diagnosis not present

## 2016-08-23 DIAGNOSIS — D509 Iron deficiency anemia, unspecified: Secondary | ICD-10-CM | POA: Diagnosis not present

## 2016-08-23 DIAGNOSIS — N2581 Secondary hyperparathyroidism of renal origin: Secondary | ICD-10-CM | POA: Diagnosis not present

## 2016-08-23 DIAGNOSIS — Z79899 Other long term (current) drug therapy: Secondary | ICD-10-CM | POA: Diagnosis not present

## 2016-08-23 DIAGNOSIS — N2589 Other disorders resulting from impaired renal tubular function: Secondary | ICD-10-CM | POA: Diagnosis not present

## 2016-08-23 DIAGNOSIS — N186 End stage renal disease: Secondary | ICD-10-CM | POA: Diagnosis not present

## 2016-08-23 DIAGNOSIS — K769 Liver disease, unspecified: Secondary | ICD-10-CM | POA: Diagnosis not present

## 2016-08-23 DIAGNOSIS — E46 Unspecified protein-calorie malnutrition: Secondary | ICD-10-CM | POA: Diagnosis not present

## 2016-08-23 DIAGNOSIS — D631 Anemia in chronic kidney disease: Secondary | ICD-10-CM | POA: Diagnosis not present

## 2016-08-24 DIAGNOSIS — D509 Iron deficiency anemia, unspecified: Secondary | ICD-10-CM | POA: Diagnosis not present

## 2016-08-24 DIAGNOSIS — N2589 Other disorders resulting from impaired renal tubular function: Secondary | ICD-10-CM | POA: Diagnosis not present

## 2016-08-24 DIAGNOSIS — N186 End stage renal disease: Secondary | ICD-10-CM | POA: Diagnosis not present

## 2016-08-24 DIAGNOSIS — D631 Anemia in chronic kidney disease: Secondary | ICD-10-CM | POA: Diagnosis not present

## 2016-08-24 DIAGNOSIS — Z79899 Other long term (current) drug therapy: Secondary | ICD-10-CM | POA: Diagnosis not present

## 2016-08-24 DIAGNOSIS — K769 Liver disease, unspecified: Secondary | ICD-10-CM | POA: Diagnosis not present

## 2016-08-25 DIAGNOSIS — D509 Iron deficiency anemia, unspecified: Secondary | ICD-10-CM | POA: Diagnosis not present

## 2016-08-25 DIAGNOSIS — K769 Liver disease, unspecified: Secondary | ICD-10-CM | POA: Diagnosis not present

## 2016-08-25 DIAGNOSIS — D631 Anemia in chronic kidney disease: Secondary | ICD-10-CM | POA: Diagnosis not present

## 2016-08-25 DIAGNOSIS — Z79899 Other long term (current) drug therapy: Secondary | ICD-10-CM | POA: Diagnosis not present

## 2016-08-25 DIAGNOSIS — N2589 Other disorders resulting from impaired renal tubular function: Secondary | ICD-10-CM | POA: Diagnosis not present

## 2016-08-25 DIAGNOSIS — N186 End stage renal disease: Secondary | ICD-10-CM | POA: Diagnosis not present

## 2016-08-26 DIAGNOSIS — N2589 Other disorders resulting from impaired renal tubular function: Secondary | ICD-10-CM | POA: Diagnosis not present

## 2016-08-26 DIAGNOSIS — Z79899 Other long term (current) drug therapy: Secondary | ICD-10-CM | POA: Diagnosis not present

## 2016-08-26 DIAGNOSIS — K769 Liver disease, unspecified: Secondary | ICD-10-CM | POA: Diagnosis not present

## 2016-08-26 DIAGNOSIS — R8299 Other abnormal findings in urine: Secondary | ICD-10-CM | POA: Diagnosis not present

## 2016-08-26 DIAGNOSIS — D509 Iron deficiency anemia, unspecified: Secondary | ICD-10-CM | POA: Diagnosis not present

## 2016-08-26 DIAGNOSIS — N186 End stage renal disease: Secondary | ICD-10-CM | POA: Diagnosis not present

## 2016-08-26 DIAGNOSIS — D631 Anemia in chronic kidney disease: Secondary | ICD-10-CM | POA: Diagnosis not present

## 2016-08-27 DIAGNOSIS — Z79899 Other long term (current) drug therapy: Secondary | ICD-10-CM | POA: Diagnosis not present

## 2016-08-27 DIAGNOSIS — N2589 Other disorders resulting from impaired renal tubular function: Secondary | ICD-10-CM | POA: Diagnosis not present

## 2016-08-27 DIAGNOSIS — D631 Anemia in chronic kidney disease: Secondary | ICD-10-CM | POA: Diagnosis not present

## 2016-08-27 DIAGNOSIS — D509 Iron deficiency anemia, unspecified: Secondary | ICD-10-CM | POA: Diagnosis not present

## 2016-08-27 DIAGNOSIS — K769 Liver disease, unspecified: Secondary | ICD-10-CM | POA: Diagnosis not present

## 2016-08-27 DIAGNOSIS — N186 End stage renal disease: Secondary | ICD-10-CM | POA: Diagnosis not present

## 2016-08-28 DIAGNOSIS — N2589 Other disorders resulting from impaired renal tubular function: Secondary | ICD-10-CM | POA: Diagnosis not present

## 2016-08-28 DIAGNOSIS — Z79899 Other long term (current) drug therapy: Secondary | ICD-10-CM | POA: Diagnosis not present

## 2016-08-28 DIAGNOSIS — K769 Liver disease, unspecified: Secondary | ICD-10-CM | POA: Diagnosis not present

## 2016-08-28 DIAGNOSIS — D631 Anemia in chronic kidney disease: Secondary | ICD-10-CM | POA: Diagnosis not present

## 2016-08-28 DIAGNOSIS — N186 End stage renal disease: Secondary | ICD-10-CM | POA: Diagnosis not present

## 2016-08-28 DIAGNOSIS — D509 Iron deficiency anemia, unspecified: Secondary | ICD-10-CM | POA: Diagnosis not present

## 2016-08-29 DIAGNOSIS — D509 Iron deficiency anemia, unspecified: Secondary | ICD-10-CM | POA: Diagnosis not present

## 2016-08-29 DIAGNOSIS — N2589 Other disorders resulting from impaired renal tubular function: Secondary | ICD-10-CM | POA: Diagnosis not present

## 2016-08-29 DIAGNOSIS — N186 End stage renal disease: Secondary | ICD-10-CM | POA: Diagnosis not present

## 2016-08-29 DIAGNOSIS — Z79899 Other long term (current) drug therapy: Secondary | ICD-10-CM | POA: Diagnosis not present

## 2016-08-29 DIAGNOSIS — H4313 Vitreous hemorrhage, bilateral: Secondary | ICD-10-CM | POA: Diagnosis not present

## 2016-08-29 DIAGNOSIS — K769 Liver disease, unspecified: Secondary | ICD-10-CM | POA: Diagnosis not present

## 2016-08-29 DIAGNOSIS — D631 Anemia in chronic kidney disease: Secondary | ICD-10-CM | POA: Diagnosis not present

## 2016-08-29 DIAGNOSIS — Z0100 Encounter for examination of eyes and vision without abnormal findings: Secondary | ICD-10-CM | POA: Diagnosis not present

## 2016-08-30 DIAGNOSIS — D631 Anemia in chronic kidney disease: Secondary | ICD-10-CM | POA: Diagnosis not present

## 2016-08-30 DIAGNOSIS — Z79899 Other long term (current) drug therapy: Secondary | ICD-10-CM | POA: Diagnosis not present

## 2016-08-30 DIAGNOSIS — D509 Iron deficiency anemia, unspecified: Secondary | ICD-10-CM | POA: Diagnosis not present

## 2016-08-30 DIAGNOSIS — N2589 Other disorders resulting from impaired renal tubular function: Secondary | ICD-10-CM | POA: Diagnosis not present

## 2016-08-30 DIAGNOSIS — N186 End stage renal disease: Secondary | ICD-10-CM | POA: Diagnosis not present

## 2016-08-30 DIAGNOSIS — K769 Liver disease, unspecified: Secondary | ICD-10-CM | POA: Diagnosis not present

## 2016-08-31 DIAGNOSIS — N2589 Other disorders resulting from impaired renal tubular function: Secondary | ICD-10-CM | POA: Diagnosis not present

## 2016-08-31 DIAGNOSIS — D631 Anemia in chronic kidney disease: Secondary | ICD-10-CM | POA: Diagnosis not present

## 2016-08-31 DIAGNOSIS — K769 Liver disease, unspecified: Secondary | ICD-10-CM | POA: Diagnosis not present

## 2016-08-31 DIAGNOSIS — N186 End stage renal disease: Secondary | ICD-10-CM | POA: Diagnosis not present

## 2016-08-31 DIAGNOSIS — Z79899 Other long term (current) drug therapy: Secondary | ICD-10-CM | POA: Diagnosis not present

## 2016-08-31 DIAGNOSIS — D509 Iron deficiency anemia, unspecified: Secondary | ICD-10-CM | POA: Diagnosis not present

## 2016-09-01 DIAGNOSIS — N186 End stage renal disease: Secondary | ICD-10-CM | POA: Diagnosis not present

## 2016-09-01 DIAGNOSIS — K769 Liver disease, unspecified: Secondary | ICD-10-CM | POA: Diagnosis not present

## 2016-09-01 DIAGNOSIS — D509 Iron deficiency anemia, unspecified: Secondary | ICD-10-CM | POA: Diagnosis not present

## 2016-09-01 DIAGNOSIS — N2589 Other disorders resulting from impaired renal tubular function: Secondary | ICD-10-CM | POA: Diagnosis not present

## 2016-09-01 DIAGNOSIS — D631 Anemia in chronic kidney disease: Secondary | ICD-10-CM | POA: Diagnosis not present

## 2016-09-01 DIAGNOSIS — Z79899 Other long term (current) drug therapy: Secondary | ICD-10-CM | POA: Diagnosis not present

## 2016-09-02 DIAGNOSIS — D631 Anemia in chronic kidney disease: Secondary | ICD-10-CM | POA: Diagnosis not present

## 2016-09-02 DIAGNOSIS — N186 End stage renal disease: Secondary | ICD-10-CM | POA: Diagnosis not present

## 2016-09-02 DIAGNOSIS — D509 Iron deficiency anemia, unspecified: Secondary | ICD-10-CM | POA: Diagnosis not present

## 2016-09-02 DIAGNOSIS — N2589 Other disorders resulting from impaired renal tubular function: Secondary | ICD-10-CM | POA: Diagnosis not present

## 2016-09-02 DIAGNOSIS — Z79899 Other long term (current) drug therapy: Secondary | ICD-10-CM | POA: Diagnosis not present

## 2016-09-02 DIAGNOSIS — K769 Liver disease, unspecified: Secondary | ICD-10-CM | POA: Diagnosis not present

## 2016-09-03 DIAGNOSIS — N186 End stage renal disease: Secondary | ICD-10-CM | POA: Diagnosis not present

## 2016-09-03 DIAGNOSIS — N2589 Other disorders resulting from impaired renal tubular function: Secondary | ICD-10-CM | POA: Diagnosis not present

## 2016-09-03 DIAGNOSIS — Z79899 Other long term (current) drug therapy: Secondary | ICD-10-CM | POA: Diagnosis not present

## 2016-09-03 DIAGNOSIS — K769 Liver disease, unspecified: Secondary | ICD-10-CM | POA: Diagnosis not present

## 2016-09-03 DIAGNOSIS — D631 Anemia in chronic kidney disease: Secondary | ICD-10-CM | POA: Diagnosis not present

## 2016-09-03 DIAGNOSIS — D509 Iron deficiency anemia, unspecified: Secondary | ICD-10-CM | POA: Diagnosis not present

## 2016-09-04 DIAGNOSIS — Z79899 Other long term (current) drug therapy: Secondary | ICD-10-CM | POA: Diagnosis not present

## 2016-09-04 DIAGNOSIS — D631 Anemia in chronic kidney disease: Secondary | ICD-10-CM | POA: Diagnosis not present

## 2016-09-04 DIAGNOSIS — N2589 Other disorders resulting from impaired renal tubular function: Secondary | ICD-10-CM | POA: Diagnosis not present

## 2016-09-04 DIAGNOSIS — N186 End stage renal disease: Secondary | ICD-10-CM | POA: Diagnosis not present

## 2016-09-04 DIAGNOSIS — K769 Liver disease, unspecified: Secondary | ICD-10-CM | POA: Diagnosis not present

## 2016-09-04 DIAGNOSIS — D509 Iron deficiency anemia, unspecified: Secondary | ICD-10-CM | POA: Diagnosis not present

## 2016-09-05 DIAGNOSIS — D631 Anemia in chronic kidney disease: Secondary | ICD-10-CM | POA: Diagnosis not present

## 2016-09-05 DIAGNOSIS — D509 Iron deficiency anemia, unspecified: Secondary | ICD-10-CM | POA: Diagnosis not present

## 2016-09-05 DIAGNOSIS — N2589 Other disorders resulting from impaired renal tubular function: Secondary | ICD-10-CM | POA: Diagnosis not present

## 2016-09-05 DIAGNOSIS — Z79899 Other long term (current) drug therapy: Secondary | ICD-10-CM | POA: Diagnosis not present

## 2016-09-05 DIAGNOSIS — N186 End stage renal disease: Secondary | ICD-10-CM | POA: Diagnosis not present

## 2016-09-05 DIAGNOSIS — K769 Liver disease, unspecified: Secondary | ICD-10-CM | POA: Diagnosis not present

## 2016-09-06 DIAGNOSIS — N2589 Other disorders resulting from impaired renal tubular function: Secondary | ICD-10-CM | POA: Diagnosis not present

## 2016-09-06 DIAGNOSIS — D509 Iron deficiency anemia, unspecified: Secondary | ICD-10-CM | POA: Diagnosis not present

## 2016-09-06 DIAGNOSIS — Z79899 Other long term (current) drug therapy: Secondary | ICD-10-CM | POA: Diagnosis not present

## 2016-09-06 DIAGNOSIS — N186 End stage renal disease: Secondary | ICD-10-CM | POA: Diagnosis not present

## 2016-09-06 DIAGNOSIS — K769 Liver disease, unspecified: Secondary | ICD-10-CM | POA: Diagnosis not present

## 2016-09-06 DIAGNOSIS — D631 Anemia in chronic kidney disease: Secondary | ICD-10-CM | POA: Diagnosis not present

## 2016-09-07 DIAGNOSIS — Z79899 Other long term (current) drug therapy: Secondary | ICD-10-CM | POA: Diagnosis not present

## 2016-09-07 DIAGNOSIS — K769 Liver disease, unspecified: Secondary | ICD-10-CM | POA: Diagnosis not present

## 2016-09-07 DIAGNOSIS — D509 Iron deficiency anemia, unspecified: Secondary | ICD-10-CM | POA: Diagnosis not present

## 2016-09-07 DIAGNOSIS — D631 Anemia in chronic kidney disease: Secondary | ICD-10-CM | POA: Diagnosis not present

## 2016-09-07 DIAGNOSIS — N186 End stage renal disease: Secondary | ICD-10-CM | POA: Diagnosis not present

## 2016-09-07 DIAGNOSIS — N2589 Other disorders resulting from impaired renal tubular function: Secondary | ICD-10-CM | POA: Diagnosis not present

## 2016-09-08 DIAGNOSIS — K769 Liver disease, unspecified: Secondary | ICD-10-CM | POA: Diagnosis not present

## 2016-09-08 DIAGNOSIS — Z79899 Other long term (current) drug therapy: Secondary | ICD-10-CM | POA: Diagnosis not present

## 2016-09-08 DIAGNOSIS — D631 Anemia in chronic kidney disease: Secondary | ICD-10-CM | POA: Diagnosis not present

## 2016-09-08 DIAGNOSIS — N2589 Other disorders resulting from impaired renal tubular function: Secondary | ICD-10-CM | POA: Diagnosis not present

## 2016-09-08 DIAGNOSIS — N186 End stage renal disease: Secondary | ICD-10-CM | POA: Diagnosis not present

## 2016-09-08 DIAGNOSIS — D509 Iron deficiency anemia, unspecified: Secondary | ICD-10-CM | POA: Diagnosis not present

## 2016-09-09 DIAGNOSIS — D631 Anemia in chronic kidney disease: Secondary | ICD-10-CM | POA: Diagnosis not present

## 2016-09-09 DIAGNOSIS — N186 End stage renal disease: Secondary | ICD-10-CM | POA: Diagnosis not present

## 2016-09-09 DIAGNOSIS — N2589 Other disorders resulting from impaired renal tubular function: Secondary | ICD-10-CM | POA: Diagnosis not present

## 2016-09-09 DIAGNOSIS — K769 Liver disease, unspecified: Secondary | ICD-10-CM | POA: Diagnosis not present

## 2016-09-09 DIAGNOSIS — Z79899 Other long term (current) drug therapy: Secondary | ICD-10-CM | POA: Diagnosis not present

## 2016-09-09 DIAGNOSIS — D509 Iron deficiency anemia, unspecified: Secondary | ICD-10-CM | POA: Diagnosis not present

## 2016-09-10 DIAGNOSIS — K769 Liver disease, unspecified: Secondary | ICD-10-CM | POA: Diagnosis not present

## 2016-09-10 DIAGNOSIS — D631 Anemia in chronic kidney disease: Secondary | ICD-10-CM | POA: Diagnosis not present

## 2016-09-10 DIAGNOSIS — N186 End stage renal disease: Secondary | ICD-10-CM | POA: Diagnosis not present

## 2016-09-10 DIAGNOSIS — Z79899 Other long term (current) drug therapy: Secondary | ICD-10-CM | POA: Diagnosis not present

## 2016-09-10 DIAGNOSIS — N2589 Other disorders resulting from impaired renal tubular function: Secondary | ICD-10-CM | POA: Diagnosis not present

## 2016-09-10 DIAGNOSIS — D509 Iron deficiency anemia, unspecified: Secondary | ICD-10-CM | POA: Diagnosis not present

## 2016-09-11 DIAGNOSIS — N186 End stage renal disease: Secondary | ICD-10-CM | POA: Diagnosis not present

## 2016-09-11 DIAGNOSIS — K769 Liver disease, unspecified: Secondary | ICD-10-CM | POA: Diagnosis not present

## 2016-09-11 DIAGNOSIS — N2589 Other disorders resulting from impaired renal tubular function: Secondary | ICD-10-CM | POA: Diagnosis not present

## 2016-09-11 DIAGNOSIS — D631 Anemia in chronic kidney disease: Secondary | ICD-10-CM | POA: Diagnosis not present

## 2016-09-11 DIAGNOSIS — D509 Iron deficiency anemia, unspecified: Secondary | ICD-10-CM | POA: Diagnosis not present

## 2016-09-11 DIAGNOSIS — Z79899 Other long term (current) drug therapy: Secondary | ICD-10-CM | POA: Diagnosis not present

## 2016-09-12 DIAGNOSIS — K769 Liver disease, unspecified: Secondary | ICD-10-CM | POA: Diagnosis not present

## 2016-09-12 DIAGNOSIS — D509 Iron deficiency anemia, unspecified: Secondary | ICD-10-CM | POA: Diagnosis not present

## 2016-09-12 DIAGNOSIS — N2589 Other disorders resulting from impaired renal tubular function: Secondary | ICD-10-CM | POA: Diagnosis not present

## 2016-09-12 DIAGNOSIS — D631 Anemia in chronic kidney disease: Secondary | ICD-10-CM | POA: Diagnosis not present

## 2016-09-12 DIAGNOSIS — N186 End stage renal disease: Secondary | ICD-10-CM | POA: Diagnosis not present

## 2016-09-12 DIAGNOSIS — Z79899 Other long term (current) drug therapy: Secondary | ICD-10-CM | POA: Diagnosis not present

## 2016-09-13 DIAGNOSIS — D631 Anemia in chronic kidney disease: Secondary | ICD-10-CM | POA: Diagnosis not present

## 2016-09-13 DIAGNOSIS — Z79899 Other long term (current) drug therapy: Secondary | ICD-10-CM | POA: Diagnosis not present

## 2016-09-13 DIAGNOSIS — N186 End stage renal disease: Secondary | ICD-10-CM | POA: Diagnosis not present

## 2016-09-13 DIAGNOSIS — N2589 Other disorders resulting from impaired renal tubular function: Secondary | ICD-10-CM | POA: Diagnosis not present

## 2016-09-13 DIAGNOSIS — D509 Iron deficiency anemia, unspecified: Secondary | ICD-10-CM | POA: Diagnosis not present

## 2016-09-13 DIAGNOSIS — K769 Liver disease, unspecified: Secondary | ICD-10-CM | POA: Diagnosis not present

## 2016-09-14 DIAGNOSIS — N2589 Other disorders resulting from impaired renal tubular function: Secondary | ICD-10-CM | POA: Diagnosis not present

## 2016-09-14 DIAGNOSIS — D509 Iron deficiency anemia, unspecified: Secondary | ICD-10-CM | POA: Diagnosis not present

## 2016-09-14 DIAGNOSIS — N186 End stage renal disease: Secondary | ICD-10-CM | POA: Diagnosis not present

## 2016-09-14 DIAGNOSIS — K769 Liver disease, unspecified: Secondary | ICD-10-CM | POA: Diagnosis not present

## 2016-09-14 DIAGNOSIS — D631 Anemia in chronic kidney disease: Secondary | ICD-10-CM | POA: Diagnosis not present

## 2016-09-14 DIAGNOSIS — Z79899 Other long term (current) drug therapy: Secondary | ICD-10-CM | POA: Diagnosis not present

## 2016-09-15 DIAGNOSIS — K769 Liver disease, unspecified: Secondary | ICD-10-CM | POA: Diagnosis not present

## 2016-09-15 DIAGNOSIS — N2589 Other disorders resulting from impaired renal tubular function: Secondary | ICD-10-CM | POA: Diagnosis not present

## 2016-09-15 DIAGNOSIS — D509 Iron deficiency anemia, unspecified: Secondary | ICD-10-CM | POA: Diagnosis not present

## 2016-09-15 DIAGNOSIS — Z79899 Other long term (current) drug therapy: Secondary | ICD-10-CM | POA: Diagnosis not present

## 2016-09-15 DIAGNOSIS — N186 End stage renal disease: Secondary | ICD-10-CM | POA: Diagnosis not present

## 2016-09-15 DIAGNOSIS — D631 Anemia in chronic kidney disease: Secondary | ICD-10-CM | POA: Diagnosis not present

## 2016-09-16 DIAGNOSIS — N2589 Other disorders resulting from impaired renal tubular function: Secondary | ICD-10-CM | POA: Diagnosis not present

## 2016-09-16 DIAGNOSIS — D509 Iron deficiency anemia, unspecified: Secondary | ICD-10-CM | POA: Diagnosis not present

## 2016-09-16 DIAGNOSIS — N186 End stage renal disease: Secondary | ICD-10-CM | POA: Diagnosis not present

## 2016-09-16 DIAGNOSIS — Z79899 Other long term (current) drug therapy: Secondary | ICD-10-CM | POA: Diagnosis not present

## 2016-09-16 DIAGNOSIS — K769 Liver disease, unspecified: Secondary | ICD-10-CM | POA: Diagnosis not present

## 2016-09-16 DIAGNOSIS — D631 Anemia in chronic kidney disease: Secondary | ICD-10-CM | POA: Diagnosis not present

## 2016-09-17 DIAGNOSIS — Z79899 Other long term (current) drug therapy: Secondary | ICD-10-CM | POA: Diagnosis not present

## 2016-09-17 DIAGNOSIS — K769 Liver disease, unspecified: Secondary | ICD-10-CM | POA: Diagnosis not present

## 2016-09-17 DIAGNOSIS — D509 Iron deficiency anemia, unspecified: Secondary | ICD-10-CM | POA: Diagnosis not present

## 2016-09-17 DIAGNOSIS — N186 End stage renal disease: Secondary | ICD-10-CM | POA: Diagnosis not present

## 2016-09-17 DIAGNOSIS — N2589 Other disorders resulting from impaired renal tubular function: Secondary | ICD-10-CM | POA: Diagnosis not present

## 2016-09-17 DIAGNOSIS — D631 Anemia in chronic kidney disease: Secondary | ICD-10-CM | POA: Diagnosis not present

## 2016-09-18 DIAGNOSIS — D631 Anemia in chronic kidney disease: Secondary | ICD-10-CM | POA: Diagnosis not present

## 2016-09-18 DIAGNOSIS — Z79899 Other long term (current) drug therapy: Secondary | ICD-10-CM | POA: Diagnosis not present

## 2016-09-18 DIAGNOSIS — N186 End stage renal disease: Secondary | ICD-10-CM | POA: Diagnosis not present

## 2016-09-18 DIAGNOSIS — D509 Iron deficiency anemia, unspecified: Secondary | ICD-10-CM | POA: Diagnosis not present

## 2016-09-18 DIAGNOSIS — K769 Liver disease, unspecified: Secondary | ICD-10-CM | POA: Diagnosis not present

## 2016-09-18 DIAGNOSIS — N2589 Other disorders resulting from impaired renal tubular function: Secondary | ICD-10-CM | POA: Diagnosis not present

## 2016-09-19 DIAGNOSIS — K769 Liver disease, unspecified: Secondary | ICD-10-CM | POA: Diagnosis not present

## 2016-09-19 DIAGNOSIS — D631 Anemia in chronic kidney disease: Secondary | ICD-10-CM | POA: Diagnosis not present

## 2016-09-19 DIAGNOSIS — Z79899 Other long term (current) drug therapy: Secondary | ICD-10-CM | POA: Diagnosis not present

## 2016-09-19 DIAGNOSIS — N2589 Other disorders resulting from impaired renal tubular function: Secondary | ICD-10-CM | POA: Diagnosis not present

## 2016-09-19 DIAGNOSIS — D509 Iron deficiency anemia, unspecified: Secondary | ICD-10-CM | POA: Diagnosis not present

## 2016-09-19 DIAGNOSIS — N186 End stage renal disease: Secondary | ICD-10-CM | POA: Diagnosis not present

## 2016-09-20 DIAGNOSIS — Z79899 Other long term (current) drug therapy: Secondary | ICD-10-CM | POA: Diagnosis not present

## 2016-09-20 DIAGNOSIS — N2589 Other disorders resulting from impaired renal tubular function: Secondary | ICD-10-CM | POA: Diagnosis not present

## 2016-09-20 DIAGNOSIS — D509 Iron deficiency anemia, unspecified: Secondary | ICD-10-CM | POA: Diagnosis not present

## 2016-09-20 DIAGNOSIS — N186 End stage renal disease: Secondary | ICD-10-CM | POA: Diagnosis not present

## 2016-09-20 DIAGNOSIS — K769 Liver disease, unspecified: Secondary | ICD-10-CM | POA: Diagnosis not present

## 2016-09-20 DIAGNOSIS — D631 Anemia in chronic kidney disease: Secondary | ICD-10-CM | POA: Diagnosis not present

## 2016-09-21 DIAGNOSIS — Z79899 Other long term (current) drug therapy: Secondary | ICD-10-CM | POA: Diagnosis not present

## 2016-09-21 DIAGNOSIS — D509 Iron deficiency anemia, unspecified: Secondary | ICD-10-CM | POA: Diagnosis not present

## 2016-09-21 DIAGNOSIS — N2589 Other disorders resulting from impaired renal tubular function: Secondary | ICD-10-CM | POA: Diagnosis not present

## 2016-09-21 DIAGNOSIS — N186 End stage renal disease: Secondary | ICD-10-CM | POA: Diagnosis not present

## 2016-09-21 DIAGNOSIS — K769 Liver disease, unspecified: Secondary | ICD-10-CM | POA: Diagnosis not present

## 2016-09-21 DIAGNOSIS — D631 Anemia in chronic kidney disease: Secondary | ICD-10-CM | POA: Diagnosis not present

## 2016-09-22 DIAGNOSIS — T8612 Kidney transplant failure: Secondary | ICD-10-CM | POA: Diagnosis not present

## 2016-09-22 DIAGNOSIS — K769 Liver disease, unspecified: Secondary | ICD-10-CM | POA: Diagnosis not present

## 2016-09-22 DIAGNOSIS — D509 Iron deficiency anemia, unspecified: Secondary | ICD-10-CM | POA: Diagnosis not present

## 2016-09-22 DIAGNOSIS — Z79899 Other long term (current) drug therapy: Secondary | ICD-10-CM | POA: Diagnosis not present

## 2016-09-22 DIAGNOSIS — N186 End stage renal disease: Secondary | ICD-10-CM | POA: Diagnosis not present

## 2016-09-22 DIAGNOSIS — Z992 Dependence on renal dialysis: Secondary | ICD-10-CM | POA: Diagnosis not present

## 2016-09-22 DIAGNOSIS — N2589 Other disorders resulting from impaired renal tubular function: Secondary | ICD-10-CM | POA: Diagnosis not present

## 2016-09-22 DIAGNOSIS — D631 Anemia in chronic kidney disease: Secondary | ICD-10-CM | POA: Diagnosis not present

## 2016-09-23 DIAGNOSIS — N2581 Secondary hyperparathyroidism of renal origin: Secondary | ICD-10-CM | POA: Diagnosis not present

## 2016-09-23 DIAGNOSIS — D631 Anemia in chronic kidney disease: Secondary | ICD-10-CM | POA: Diagnosis not present

## 2016-09-23 DIAGNOSIS — D509 Iron deficiency anemia, unspecified: Secondary | ICD-10-CM | POA: Diagnosis not present

## 2016-09-23 DIAGNOSIS — Z4931 Encounter for adequacy testing for hemodialysis: Secondary | ICD-10-CM | POA: Diagnosis not present

## 2016-09-23 DIAGNOSIS — N186 End stage renal disease: Secondary | ICD-10-CM | POA: Diagnosis not present

## 2016-09-23 DIAGNOSIS — N2589 Other disorders resulting from impaired renal tubular function: Secondary | ICD-10-CM | POA: Diagnosis not present

## 2016-09-23 DIAGNOSIS — K769 Liver disease, unspecified: Secondary | ICD-10-CM | POA: Diagnosis not present

## 2016-09-24 DIAGNOSIS — N2581 Secondary hyperparathyroidism of renal origin: Secondary | ICD-10-CM | POA: Diagnosis not present

## 2016-09-24 DIAGNOSIS — E785 Hyperlipidemia, unspecified: Secondary | ICD-10-CM | POA: Diagnosis not present

## 2016-09-24 DIAGNOSIS — K769 Liver disease, unspecified: Secondary | ICD-10-CM | POA: Diagnosis not present

## 2016-09-24 DIAGNOSIS — E784 Other hyperlipidemia: Secondary | ICD-10-CM | POA: Diagnosis not present

## 2016-09-24 DIAGNOSIS — R8299 Other abnormal findings in urine: Secondary | ICD-10-CM | POA: Diagnosis not present

## 2016-09-24 DIAGNOSIS — D509 Iron deficiency anemia, unspecified: Secondary | ICD-10-CM | POA: Diagnosis not present

## 2016-09-24 DIAGNOSIS — D631 Anemia in chronic kidney disease: Secondary | ICD-10-CM | POA: Diagnosis not present

## 2016-09-24 DIAGNOSIS — N186 End stage renal disease: Secondary | ICD-10-CM | POA: Diagnosis not present

## 2016-09-24 DIAGNOSIS — N2589 Other disorders resulting from impaired renal tubular function: Secondary | ICD-10-CM | POA: Diagnosis not present

## 2016-09-24 DIAGNOSIS — Z7682 Awaiting organ transplant status: Secondary | ICD-10-CM | POA: Diagnosis not present

## 2016-09-24 DIAGNOSIS — E119 Type 2 diabetes mellitus without complications: Secondary | ICD-10-CM | POA: Diagnosis not present

## 2016-09-25 DIAGNOSIS — N2589 Other disorders resulting from impaired renal tubular function: Secondary | ICD-10-CM | POA: Diagnosis not present

## 2016-09-25 DIAGNOSIS — K769 Liver disease, unspecified: Secondary | ICD-10-CM | POA: Diagnosis not present

## 2016-09-25 DIAGNOSIS — D509 Iron deficiency anemia, unspecified: Secondary | ICD-10-CM | POA: Diagnosis not present

## 2016-09-25 DIAGNOSIS — N186 End stage renal disease: Secondary | ICD-10-CM | POA: Diagnosis not present

## 2016-09-25 DIAGNOSIS — N2581 Secondary hyperparathyroidism of renal origin: Secondary | ICD-10-CM | POA: Diagnosis not present

## 2016-09-25 DIAGNOSIS — D631 Anemia in chronic kidney disease: Secondary | ICD-10-CM | POA: Diagnosis not present

## 2016-09-26 DIAGNOSIS — D509 Iron deficiency anemia, unspecified: Secondary | ICD-10-CM | POA: Diagnosis not present

## 2016-09-26 DIAGNOSIS — D631 Anemia in chronic kidney disease: Secondary | ICD-10-CM | POA: Diagnosis not present

## 2016-09-26 DIAGNOSIS — N186 End stage renal disease: Secondary | ICD-10-CM | POA: Diagnosis not present

## 2016-09-26 DIAGNOSIS — K769 Liver disease, unspecified: Secondary | ICD-10-CM | POA: Diagnosis not present

## 2016-09-26 DIAGNOSIS — N2581 Secondary hyperparathyroidism of renal origin: Secondary | ICD-10-CM | POA: Diagnosis not present

## 2016-09-26 DIAGNOSIS — N2589 Other disorders resulting from impaired renal tubular function: Secondary | ICD-10-CM | POA: Diagnosis not present

## 2016-09-27 DIAGNOSIS — N186 End stage renal disease: Secondary | ICD-10-CM | POA: Diagnosis not present

## 2016-09-27 DIAGNOSIS — K769 Liver disease, unspecified: Secondary | ICD-10-CM | POA: Diagnosis not present

## 2016-09-27 DIAGNOSIS — D631 Anemia in chronic kidney disease: Secondary | ICD-10-CM | POA: Diagnosis not present

## 2016-09-27 DIAGNOSIS — D509 Iron deficiency anemia, unspecified: Secondary | ICD-10-CM | POA: Diagnosis not present

## 2016-09-27 DIAGNOSIS — N2589 Other disorders resulting from impaired renal tubular function: Secondary | ICD-10-CM | POA: Diagnosis not present

## 2016-09-27 DIAGNOSIS — N2581 Secondary hyperparathyroidism of renal origin: Secondary | ICD-10-CM | POA: Diagnosis not present

## 2016-09-28 DIAGNOSIS — D509 Iron deficiency anemia, unspecified: Secondary | ICD-10-CM | POA: Diagnosis not present

## 2016-09-28 DIAGNOSIS — K769 Liver disease, unspecified: Secondary | ICD-10-CM | POA: Diagnosis not present

## 2016-09-28 DIAGNOSIS — N186 End stage renal disease: Secondary | ICD-10-CM | POA: Diagnosis not present

## 2016-09-28 DIAGNOSIS — D631 Anemia in chronic kidney disease: Secondary | ICD-10-CM | POA: Diagnosis not present

## 2016-09-28 DIAGNOSIS — N2581 Secondary hyperparathyroidism of renal origin: Secondary | ICD-10-CM | POA: Diagnosis not present

## 2016-09-28 DIAGNOSIS — N2589 Other disorders resulting from impaired renal tubular function: Secondary | ICD-10-CM | POA: Diagnosis not present

## 2016-09-29 DIAGNOSIS — D509 Iron deficiency anemia, unspecified: Secondary | ICD-10-CM | POA: Diagnosis not present

## 2016-09-29 DIAGNOSIS — N186 End stage renal disease: Secondary | ICD-10-CM | POA: Diagnosis not present

## 2016-09-29 DIAGNOSIS — N2581 Secondary hyperparathyroidism of renal origin: Secondary | ICD-10-CM | POA: Diagnosis not present

## 2016-09-29 DIAGNOSIS — D631 Anemia in chronic kidney disease: Secondary | ICD-10-CM | POA: Diagnosis not present

## 2016-09-29 DIAGNOSIS — N2589 Other disorders resulting from impaired renal tubular function: Secondary | ICD-10-CM | POA: Diagnosis not present

## 2016-09-29 DIAGNOSIS — K769 Liver disease, unspecified: Secondary | ICD-10-CM | POA: Diagnosis not present

## 2016-09-30 DIAGNOSIS — D509 Iron deficiency anemia, unspecified: Secondary | ICD-10-CM | POA: Diagnosis not present

## 2016-09-30 DIAGNOSIS — K769 Liver disease, unspecified: Secondary | ICD-10-CM | POA: Diagnosis not present

## 2016-09-30 DIAGNOSIS — N2581 Secondary hyperparathyroidism of renal origin: Secondary | ICD-10-CM | POA: Diagnosis not present

## 2016-09-30 DIAGNOSIS — N2589 Other disorders resulting from impaired renal tubular function: Secondary | ICD-10-CM | POA: Diagnosis not present

## 2016-09-30 DIAGNOSIS — N186 End stage renal disease: Secondary | ICD-10-CM | POA: Diagnosis not present

## 2016-09-30 DIAGNOSIS — D631 Anemia in chronic kidney disease: Secondary | ICD-10-CM | POA: Diagnosis not present

## 2016-10-01 DIAGNOSIS — K769 Liver disease, unspecified: Secondary | ICD-10-CM | POA: Diagnosis not present

## 2016-10-01 DIAGNOSIS — N2581 Secondary hyperparathyroidism of renal origin: Secondary | ICD-10-CM | POA: Diagnosis not present

## 2016-10-01 DIAGNOSIS — N186 End stage renal disease: Secondary | ICD-10-CM | POA: Diagnosis not present

## 2016-10-01 DIAGNOSIS — D631 Anemia in chronic kidney disease: Secondary | ICD-10-CM | POA: Diagnosis not present

## 2016-10-01 DIAGNOSIS — D509 Iron deficiency anemia, unspecified: Secondary | ICD-10-CM | POA: Diagnosis not present

## 2016-10-01 DIAGNOSIS — N2589 Other disorders resulting from impaired renal tubular function: Secondary | ICD-10-CM | POA: Diagnosis not present

## 2016-10-02 DIAGNOSIS — D509 Iron deficiency anemia, unspecified: Secondary | ICD-10-CM | POA: Diagnosis not present

## 2016-10-02 DIAGNOSIS — N2581 Secondary hyperparathyroidism of renal origin: Secondary | ICD-10-CM | POA: Diagnosis not present

## 2016-10-02 DIAGNOSIS — N186 End stage renal disease: Secondary | ICD-10-CM | POA: Diagnosis not present

## 2016-10-02 DIAGNOSIS — N2589 Other disorders resulting from impaired renal tubular function: Secondary | ICD-10-CM | POA: Diagnosis not present

## 2016-10-02 DIAGNOSIS — K769 Liver disease, unspecified: Secondary | ICD-10-CM | POA: Diagnosis not present

## 2016-10-02 DIAGNOSIS — D631 Anemia in chronic kidney disease: Secondary | ICD-10-CM | POA: Diagnosis not present

## 2016-10-03 DIAGNOSIS — N2581 Secondary hyperparathyroidism of renal origin: Secondary | ICD-10-CM | POA: Diagnosis not present

## 2016-10-03 DIAGNOSIS — K769 Liver disease, unspecified: Secondary | ICD-10-CM | POA: Diagnosis not present

## 2016-10-03 DIAGNOSIS — D631 Anemia in chronic kidney disease: Secondary | ICD-10-CM | POA: Diagnosis not present

## 2016-10-03 DIAGNOSIS — D509 Iron deficiency anemia, unspecified: Secondary | ICD-10-CM | POA: Diagnosis not present

## 2016-10-03 DIAGNOSIS — N186 End stage renal disease: Secondary | ICD-10-CM | POA: Diagnosis not present

## 2016-10-03 DIAGNOSIS — N2589 Other disorders resulting from impaired renal tubular function: Secondary | ICD-10-CM | POA: Diagnosis not present

## 2016-10-04 DIAGNOSIS — D509 Iron deficiency anemia, unspecified: Secondary | ICD-10-CM | POA: Diagnosis not present

## 2016-10-04 DIAGNOSIS — N186 End stage renal disease: Secondary | ICD-10-CM | POA: Diagnosis not present

## 2016-10-04 DIAGNOSIS — N2581 Secondary hyperparathyroidism of renal origin: Secondary | ICD-10-CM | POA: Diagnosis not present

## 2016-10-04 DIAGNOSIS — D631 Anemia in chronic kidney disease: Secondary | ICD-10-CM | POA: Diagnosis not present

## 2016-10-04 DIAGNOSIS — K769 Liver disease, unspecified: Secondary | ICD-10-CM | POA: Diagnosis not present

## 2016-10-04 DIAGNOSIS — N2589 Other disorders resulting from impaired renal tubular function: Secondary | ICD-10-CM | POA: Diagnosis not present

## 2016-10-05 DIAGNOSIS — N2581 Secondary hyperparathyroidism of renal origin: Secondary | ICD-10-CM | POA: Diagnosis not present

## 2016-10-05 DIAGNOSIS — N2589 Other disorders resulting from impaired renal tubular function: Secondary | ICD-10-CM | POA: Diagnosis not present

## 2016-10-05 DIAGNOSIS — N186 End stage renal disease: Secondary | ICD-10-CM | POA: Diagnosis not present

## 2016-10-05 DIAGNOSIS — D509 Iron deficiency anemia, unspecified: Secondary | ICD-10-CM | POA: Diagnosis not present

## 2016-10-05 DIAGNOSIS — D631 Anemia in chronic kidney disease: Secondary | ICD-10-CM | POA: Diagnosis not present

## 2016-10-05 DIAGNOSIS — K769 Liver disease, unspecified: Secondary | ICD-10-CM | POA: Diagnosis not present

## 2016-10-06 DIAGNOSIS — N186 End stage renal disease: Secondary | ICD-10-CM | POA: Diagnosis not present

## 2016-10-06 DIAGNOSIS — N2581 Secondary hyperparathyroidism of renal origin: Secondary | ICD-10-CM | POA: Diagnosis not present

## 2016-10-06 DIAGNOSIS — D509 Iron deficiency anemia, unspecified: Secondary | ICD-10-CM | POA: Diagnosis not present

## 2016-10-06 DIAGNOSIS — N2589 Other disorders resulting from impaired renal tubular function: Secondary | ICD-10-CM | POA: Diagnosis not present

## 2016-10-06 DIAGNOSIS — K769 Liver disease, unspecified: Secondary | ICD-10-CM | POA: Diagnosis not present

## 2016-10-06 DIAGNOSIS — D631 Anemia in chronic kidney disease: Secondary | ICD-10-CM | POA: Diagnosis not present

## 2016-10-07 DIAGNOSIS — D509 Iron deficiency anemia, unspecified: Secondary | ICD-10-CM | POA: Diagnosis not present

## 2016-10-07 DIAGNOSIS — D631 Anemia in chronic kidney disease: Secondary | ICD-10-CM | POA: Diagnosis not present

## 2016-10-07 DIAGNOSIS — N186 End stage renal disease: Secondary | ICD-10-CM | POA: Diagnosis not present

## 2016-10-07 DIAGNOSIS — K769 Liver disease, unspecified: Secondary | ICD-10-CM | POA: Diagnosis not present

## 2016-10-07 DIAGNOSIS — N2589 Other disorders resulting from impaired renal tubular function: Secondary | ICD-10-CM | POA: Diagnosis not present

## 2016-10-07 DIAGNOSIS — N2581 Secondary hyperparathyroidism of renal origin: Secondary | ICD-10-CM | POA: Diagnosis not present

## 2016-10-08 DIAGNOSIS — N2589 Other disorders resulting from impaired renal tubular function: Secondary | ICD-10-CM | POA: Diagnosis not present

## 2016-10-08 DIAGNOSIS — N186 End stage renal disease: Secondary | ICD-10-CM | POA: Diagnosis not present

## 2016-10-08 DIAGNOSIS — K769 Liver disease, unspecified: Secondary | ICD-10-CM | POA: Diagnosis not present

## 2016-10-08 DIAGNOSIS — N2581 Secondary hyperparathyroidism of renal origin: Secondary | ICD-10-CM | POA: Diagnosis not present

## 2016-10-08 DIAGNOSIS — D509 Iron deficiency anemia, unspecified: Secondary | ICD-10-CM | POA: Diagnosis not present

## 2016-10-08 DIAGNOSIS — D631 Anemia in chronic kidney disease: Secondary | ICD-10-CM | POA: Diagnosis not present

## 2016-10-09 DIAGNOSIS — D509 Iron deficiency anemia, unspecified: Secondary | ICD-10-CM | POA: Diagnosis not present

## 2016-10-09 DIAGNOSIS — N186 End stage renal disease: Secondary | ICD-10-CM | POA: Diagnosis not present

## 2016-10-09 DIAGNOSIS — N2589 Other disorders resulting from impaired renal tubular function: Secondary | ICD-10-CM | POA: Diagnosis not present

## 2016-10-09 DIAGNOSIS — N2581 Secondary hyperparathyroidism of renal origin: Secondary | ICD-10-CM | POA: Diagnosis not present

## 2016-10-09 DIAGNOSIS — K769 Liver disease, unspecified: Secondary | ICD-10-CM | POA: Diagnosis not present

## 2016-10-09 DIAGNOSIS — D631 Anemia in chronic kidney disease: Secondary | ICD-10-CM | POA: Diagnosis not present

## 2016-10-10 DIAGNOSIS — D631 Anemia in chronic kidney disease: Secondary | ICD-10-CM | POA: Diagnosis not present

## 2016-10-10 DIAGNOSIS — D509 Iron deficiency anemia, unspecified: Secondary | ICD-10-CM | POA: Diagnosis not present

## 2016-10-10 DIAGNOSIS — N2589 Other disorders resulting from impaired renal tubular function: Secondary | ICD-10-CM | POA: Diagnosis not present

## 2016-10-10 DIAGNOSIS — N2581 Secondary hyperparathyroidism of renal origin: Secondary | ICD-10-CM | POA: Diagnosis not present

## 2016-10-10 DIAGNOSIS — N186 End stage renal disease: Secondary | ICD-10-CM | POA: Diagnosis not present

## 2016-10-10 DIAGNOSIS — K769 Liver disease, unspecified: Secondary | ICD-10-CM | POA: Diagnosis not present

## 2016-10-11 DIAGNOSIS — N2581 Secondary hyperparathyroidism of renal origin: Secondary | ICD-10-CM | POA: Diagnosis not present

## 2016-10-11 DIAGNOSIS — D509 Iron deficiency anemia, unspecified: Secondary | ICD-10-CM | POA: Diagnosis not present

## 2016-10-11 DIAGNOSIS — N186 End stage renal disease: Secondary | ICD-10-CM | POA: Diagnosis not present

## 2016-10-11 DIAGNOSIS — K769 Liver disease, unspecified: Secondary | ICD-10-CM | POA: Diagnosis not present

## 2016-10-11 DIAGNOSIS — D631 Anemia in chronic kidney disease: Secondary | ICD-10-CM | POA: Diagnosis not present

## 2016-10-11 DIAGNOSIS — N2589 Other disorders resulting from impaired renal tubular function: Secondary | ICD-10-CM | POA: Diagnosis not present

## 2016-10-12 DIAGNOSIS — D509 Iron deficiency anemia, unspecified: Secondary | ICD-10-CM | POA: Diagnosis not present

## 2016-10-12 DIAGNOSIS — N2581 Secondary hyperparathyroidism of renal origin: Secondary | ICD-10-CM | POA: Diagnosis not present

## 2016-10-12 DIAGNOSIS — N2589 Other disorders resulting from impaired renal tubular function: Secondary | ICD-10-CM | POA: Diagnosis not present

## 2016-10-12 DIAGNOSIS — D631 Anemia in chronic kidney disease: Secondary | ICD-10-CM | POA: Diagnosis not present

## 2016-10-12 DIAGNOSIS — K769 Liver disease, unspecified: Secondary | ICD-10-CM | POA: Diagnosis not present

## 2016-10-12 DIAGNOSIS — N186 End stage renal disease: Secondary | ICD-10-CM | POA: Diagnosis not present

## 2016-10-13 DIAGNOSIS — N2581 Secondary hyperparathyroidism of renal origin: Secondary | ICD-10-CM | POA: Diagnosis not present

## 2016-10-13 DIAGNOSIS — D631 Anemia in chronic kidney disease: Secondary | ICD-10-CM | POA: Diagnosis not present

## 2016-10-13 DIAGNOSIS — K769 Liver disease, unspecified: Secondary | ICD-10-CM | POA: Diagnosis not present

## 2016-10-13 DIAGNOSIS — D509 Iron deficiency anemia, unspecified: Secondary | ICD-10-CM | POA: Diagnosis not present

## 2016-10-13 DIAGNOSIS — N2589 Other disorders resulting from impaired renal tubular function: Secondary | ICD-10-CM | POA: Diagnosis not present

## 2016-10-13 DIAGNOSIS — N186 End stage renal disease: Secondary | ICD-10-CM | POA: Diagnosis not present

## 2016-10-14 DIAGNOSIS — D631 Anemia in chronic kidney disease: Secondary | ICD-10-CM | POA: Diagnosis not present

## 2016-10-14 DIAGNOSIS — N2581 Secondary hyperparathyroidism of renal origin: Secondary | ICD-10-CM | POA: Diagnosis not present

## 2016-10-14 DIAGNOSIS — K769 Liver disease, unspecified: Secondary | ICD-10-CM | POA: Diagnosis not present

## 2016-10-14 DIAGNOSIS — N2589 Other disorders resulting from impaired renal tubular function: Secondary | ICD-10-CM | POA: Diagnosis not present

## 2016-10-14 DIAGNOSIS — D509 Iron deficiency anemia, unspecified: Secondary | ICD-10-CM | POA: Diagnosis not present

## 2016-10-14 DIAGNOSIS — N186 End stage renal disease: Secondary | ICD-10-CM | POA: Diagnosis not present

## 2016-10-15 DIAGNOSIS — K769 Liver disease, unspecified: Secondary | ICD-10-CM | POA: Diagnosis not present

## 2016-10-15 DIAGNOSIS — D631 Anemia in chronic kidney disease: Secondary | ICD-10-CM | POA: Diagnosis not present

## 2016-10-15 DIAGNOSIS — D509 Iron deficiency anemia, unspecified: Secondary | ICD-10-CM | POA: Diagnosis not present

## 2016-10-15 DIAGNOSIS — N2581 Secondary hyperparathyroidism of renal origin: Secondary | ICD-10-CM | POA: Diagnosis not present

## 2016-10-15 DIAGNOSIS — N186 End stage renal disease: Secondary | ICD-10-CM | POA: Diagnosis not present

## 2016-10-15 DIAGNOSIS — N2589 Other disorders resulting from impaired renal tubular function: Secondary | ICD-10-CM | POA: Diagnosis not present

## 2016-10-16 DIAGNOSIS — N2589 Other disorders resulting from impaired renal tubular function: Secondary | ICD-10-CM | POA: Diagnosis not present

## 2016-10-16 DIAGNOSIS — D509 Iron deficiency anemia, unspecified: Secondary | ICD-10-CM | POA: Diagnosis not present

## 2016-10-16 DIAGNOSIS — N186 End stage renal disease: Secondary | ICD-10-CM | POA: Diagnosis not present

## 2016-10-16 DIAGNOSIS — D631 Anemia in chronic kidney disease: Secondary | ICD-10-CM | POA: Diagnosis not present

## 2016-10-16 DIAGNOSIS — K769 Liver disease, unspecified: Secondary | ICD-10-CM | POA: Diagnosis not present

## 2016-10-16 DIAGNOSIS — N2581 Secondary hyperparathyroidism of renal origin: Secondary | ICD-10-CM | POA: Diagnosis not present

## 2016-10-17 DIAGNOSIS — N2581 Secondary hyperparathyroidism of renal origin: Secondary | ICD-10-CM | POA: Diagnosis not present

## 2016-10-17 DIAGNOSIS — N2589 Other disorders resulting from impaired renal tubular function: Secondary | ICD-10-CM | POA: Diagnosis not present

## 2016-10-17 DIAGNOSIS — K769 Liver disease, unspecified: Secondary | ICD-10-CM | POA: Diagnosis not present

## 2016-10-17 DIAGNOSIS — N186 End stage renal disease: Secondary | ICD-10-CM | POA: Diagnosis not present

## 2016-10-17 DIAGNOSIS — D509 Iron deficiency anemia, unspecified: Secondary | ICD-10-CM | POA: Diagnosis not present

## 2016-10-17 DIAGNOSIS — D631 Anemia in chronic kidney disease: Secondary | ICD-10-CM | POA: Diagnosis not present

## 2016-10-18 DIAGNOSIS — N186 End stage renal disease: Secondary | ICD-10-CM | POA: Diagnosis not present

## 2016-10-18 DIAGNOSIS — D631 Anemia in chronic kidney disease: Secondary | ICD-10-CM | POA: Diagnosis not present

## 2016-10-18 DIAGNOSIS — K769 Liver disease, unspecified: Secondary | ICD-10-CM | POA: Diagnosis not present

## 2016-10-18 DIAGNOSIS — D509 Iron deficiency anemia, unspecified: Secondary | ICD-10-CM | POA: Diagnosis not present

## 2016-10-18 DIAGNOSIS — N2589 Other disorders resulting from impaired renal tubular function: Secondary | ICD-10-CM | POA: Diagnosis not present

## 2016-10-18 DIAGNOSIS — N2581 Secondary hyperparathyroidism of renal origin: Secondary | ICD-10-CM | POA: Diagnosis not present

## 2016-10-19 DIAGNOSIS — N2581 Secondary hyperparathyroidism of renal origin: Secondary | ICD-10-CM | POA: Diagnosis not present

## 2016-10-19 DIAGNOSIS — N2589 Other disorders resulting from impaired renal tubular function: Secondary | ICD-10-CM | POA: Diagnosis not present

## 2016-10-19 DIAGNOSIS — N186 End stage renal disease: Secondary | ICD-10-CM | POA: Diagnosis not present

## 2016-10-19 DIAGNOSIS — D509 Iron deficiency anemia, unspecified: Secondary | ICD-10-CM | POA: Diagnosis not present

## 2016-10-19 DIAGNOSIS — D631 Anemia in chronic kidney disease: Secondary | ICD-10-CM | POA: Diagnosis not present

## 2016-10-19 DIAGNOSIS — K769 Liver disease, unspecified: Secondary | ICD-10-CM | POA: Diagnosis not present

## 2016-10-20 DIAGNOSIS — N2589 Other disorders resulting from impaired renal tubular function: Secondary | ICD-10-CM | POA: Diagnosis not present

## 2016-10-20 DIAGNOSIS — N186 End stage renal disease: Secondary | ICD-10-CM | POA: Diagnosis not present

## 2016-10-20 DIAGNOSIS — K769 Liver disease, unspecified: Secondary | ICD-10-CM | POA: Diagnosis not present

## 2016-10-20 DIAGNOSIS — D509 Iron deficiency anemia, unspecified: Secondary | ICD-10-CM | POA: Diagnosis not present

## 2016-10-20 DIAGNOSIS — D631 Anemia in chronic kidney disease: Secondary | ICD-10-CM | POA: Diagnosis not present

## 2016-10-20 DIAGNOSIS — N2581 Secondary hyperparathyroidism of renal origin: Secondary | ICD-10-CM | POA: Diagnosis not present

## 2016-10-21 DIAGNOSIS — N186 End stage renal disease: Secondary | ICD-10-CM | POA: Diagnosis not present

## 2016-10-21 DIAGNOSIS — D631 Anemia in chronic kidney disease: Secondary | ICD-10-CM | POA: Diagnosis not present

## 2016-10-21 DIAGNOSIS — N2581 Secondary hyperparathyroidism of renal origin: Secondary | ICD-10-CM | POA: Diagnosis not present

## 2016-10-21 DIAGNOSIS — N2589 Other disorders resulting from impaired renal tubular function: Secondary | ICD-10-CM | POA: Diagnosis not present

## 2016-10-21 DIAGNOSIS — D509 Iron deficiency anemia, unspecified: Secondary | ICD-10-CM | POA: Diagnosis not present

## 2016-10-21 DIAGNOSIS — K769 Liver disease, unspecified: Secondary | ICD-10-CM | POA: Diagnosis not present

## 2016-10-22 DIAGNOSIS — N2581 Secondary hyperparathyroidism of renal origin: Secondary | ICD-10-CM | POA: Diagnosis not present

## 2016-10-22 DIAGNOSIS — K769 Liver disease, unspecified: Secondary | ICD-10-CM | POA: Diagnosis not present

## 2016-10-22 DIAGNOSIS — D631 Anemia in chronic kidney disease: Secondary | ICD-10-CM | POA: Diagnosis not present

## 2016-10-22 DIAGNOSIS — N186 End stage renal disease: Secondary | ICD-10-CM | POA: Diagnosis not present

## 2016-10-22 DIAGNOSIS — N2589 Other disorders resulting from impaired renal tubular function: Secondary | ICD-10-CM | POA: Diagnosis not present

## 2016-10-22 DIAGNOSIS — D509 Iron deficiency anemia, unspecified: Secondary | ICD-10-CM | POA: Diagnosis not present

## 2016-10-23 DIAGNOSIS — D631 Anemia in chronic kidney disease: Secondary | ICD-10-CM | POA: Diagnosis not present

## 2016-10-23 DIAGNOSIS — D509 Iron deficiency anemia, unspecified: Secondary | ICD-10-CM | POA: Diagnosis not present

## 2016-10-23 DIAGNOSIS — N2589 Other disorders resulting from impaired renal tubular function: Secondary | ICD-10-CM | POA: Diagnosis not present

## 2016-10-23 DIAGNOSIS — K769 Liver disease, unspecified: Secondary | ICD-10-CM | POA: Diagnosis not present

## 2016-10-23 DIAGNOSIS — N186 End stage renal disease: Secondary | ICD-10-CM | POA: Diagnosis not present

## 2016-10-23 DIAGNOSIS — T8612 Kidney transplant failure: Secondary | ICD-10-CM | POA: Diagnosis not present

## 2016-10-23 DIAGNOSIS — Z992 Dependence on renal dialysis: Secondary | ICD-10-CM | POA: Diagnosis not present

## 2016-10-23 DIAGNOSIS — N2581 Secondary hyperparathyroidism of renal origin: Secondary | ICD-10-CM | POA: Diagnosis not present

## 2016-10-24 DIAGNOSIS — K769 Liver disease, unspecified: Secondary | ICD-10-CM | POA: Diagnosis not present

## 2016-10-24 DIAGNOSIS — D631 Anemia in chronic kidney disease: Secondary | ICD-10-CM | POA: Diagnosis not present

## 2016-10-24 DIAGNOSIS — D509 Iron deficiency anemia, unspecified: Secondary | ICD-10-CM | POA: Diagnosis not present

## 2016-10-24 DIAGNOSIS — E46 Unspecified protein-calorie malnutrition: Secondary | ICD-10-CM | POA: Diagnosis not present

## 2016-10-24 DIAGNOSIS — N2581 Secondary hyperparathyroidism of renal origin: Secondary | ICD-10-CM | POA: Diagnosis not present

## 2016-10-24 DIAGNOSIS — Z79899 Other long term (current) drug therapy: Secondary | ICD-10-CM | POA: Diagnosis not present

## 2016-10-24 DIAGNOSIS — N186 End stage renal disease: Secondary | ICD-10-CM | POA: Diagnosis not present

## 2016-10-24 DIAGNOSIS — N2589 Other disorders resulting from impaired renal tubular function: Secondary | ICD-10-CM | POA: Diagnosis not present

## 2016-10-25 DIAGNOSIS — D631 Anemia in chronic kidney disease: Secondary | ICD-10-CM | POA: Diagnosis not present

## 2016-10-25 DIAGNOSIS — N186 End stage renal disease: Secondary | ICD-10-CM | POA: Diagnosis not present

## 2016-10-25 DIAGNOSIS — D509 Iron deficiency anemia, unspecified: Secondary | ICD-10-CM | POA: Diagnosis not present

## 2016-10-25 DIAGNOSIS — K769 Liver disease, unspecified: Secondary | ICD-10-CM | POA: Diagnosis not present

## 2016-10-25 DIAGNOSIS — N2581 Secondary hyperparathyroidism of renal origin: Secondary | ICD-10-CM | POA: Diagnosis not present

## 2016-10-25 DIAGNOSIS — Z79899 Other long term (current) drug therapy: Secondary | ICD-10-CM | POA: Diagnosis not present

## 2016-10-26 DIAGNOSIS — D509 Iron deficiency anemia, unspecified: Secondary | ICD-10-CM | POA: Diagnosis not present

## 2016-10-26 DIAGNOSIS — D631 Anemia in chronic kidney disease: Secondary | ICD-10-CM | POA: Diagnosis not present

## 2016-10-26 DIAGNOSIS — N2581 Secondary hyperparathyroidism of renal origin: Secondary | ICD-10-CM | POA: Diagnosis not present

## 2016-10-26 DIAGNOSIS — K769 Liver disease, unspecified: Secondary | ICD-10-CM | POA: Diagnosis not present

## 2016-10-26 DIAGNOSIS — Z79899 Other long term (current) drug therapy: Secondary | ICD-10-CM | POA: Diagnosis not present

## 2016-10-26 DIAGNOSIS — N186 End stage renal disease: Secondary | ICD-10-CM | POA: Diagnosis not present

## 2016-10-27 DIAGNOSIS — K769 Liver disease, unspecified: Secondary | ICD-10-CM | POA: Diagnosis not present

## 2016-10-27 DIAGNOSIS — N2581 Secondary hyperparathyroidism of renal origin: Secondary | ICD-10-CM | POA: Diagnosis not present

## 2016-10-27 DIAGNOSIS — D631 Anemia in chronic kidney disease: Secondary | ICD-10-CM | POA: Diagnosis not present

## 2016-10-27 DIAGNOSIS — Z79899 Other long term (current) drug therapy: Secondary | ICD-10-CM | POA: Diagnosis not present

## 2016-10-27 DIAGNOSIS — D509 Iron deficiency anemia, unspecified: Secondary | ICD-10-CM | POA: Diagnosis not present

## 2016-10-27 DIAGNOSIS — N186 End stage renal disease: Secondary | ICD-10-CM | POA: Diagnosis not present

## 2016-10-28 DIAGNOSIS — D631 Anemia in chronic kidney disease: Secondary | ICD-10-CM | POA: Diagnosis not present

## 2016-10-28 DIAGNOSIS — K769 Liver disease, unspecified: Secondary | ICD-10-CM | POA: Diagnosis not present

## 2016-10-28 DIAGNOSIS — Z79899 Other long term (current) drug therapy: Secondary | ICD-10-CM | POA: Diagnosis not present

## 2016-10-28 DIAGNOSIS — D509 Iron deficiency anemia, unspecified: Secondary | ICD-10-CM | POA: Diagnosis not present

## 2016-10-28 DIAGNOSIS — N2581 Secondary hyperparathyroidism of renal origin: Secondary | ICD-10-CM | POA: Diagnosis not present

## 2016-10-28 DIAGNOSIS — N186 End stage renal disease: Secondary | ICD-10-CM | POA: Diagnosis not present

## 2016-10-29 DIAGNOSIS — Z79899 Other long term (current) drug therapy: Secondary | ICD-10-CM | POA: Diagnosis not present

## 2016-10-29 DIAGNOSIS — K769 Liver disease, unspecified: Secondary | ICD-10-CM | POA: Diagnosis not present

## 2016-10-29 DIAGNOSIS — N186 End stage renal disease: Secondary | ICD-10-CM | POA: Diagnosis not present

## 2016-10-29 DIAGNOSIS — D509 Iron deficiency anemia, unspecified: Secondary | ICD-10-CM | POA: Diagnosis not present

## 2016-10-29 DIAGNOSIS — N2581 Secondary hyperparathyroidism of renal origin: Secondary | ICD-10-CM | POA: Diagnosis not present

## 2016-10-29 DIAGNOSIS — R8299 Other abnormal findings in urine: Secondary | ICD-10-CM | POA: Diagnosis not present

## 2016-10-29 DIAGNOSIS — D631 Anemia in chronic kidney disease: Secondary | ICD-10-CM | POA: Diagnosis not present

## 2016-10-30 DIAGNOSIS — K769 Liver disease, unspecified: Secondary | ICD-10-CM | POA: Diagnosis not present

## 2016-10-30 DIAGNOSIS — D509 Iron deficiency anemia, unspecified: Secondary | ICD-10-CM | POA: Diagnosis not present

## 2016-10-30 DIAGNOSIS — N2581 Secondary hyperparathyroidism of renal origin: Secondary | ICD-10-CM | POA: Diagnosis not present

## 2016-10-30 DIAGNOSIS — D631 Anemia in chronic kidney disease: Secondary | ICD-10-CM | POA: Diagnosis not present

## 2016-10-30 DIAGNOSIS — N186 End stage renal disease: Secondary | ICD-10-CM | POA: Diagnosis not present

## 2016-10-30 DIAGNOSIS — Z79899 Other long term (current) drug therapy: Secondary | ICD-10-CM | POA: Diagnosis not present

## 2016-10-31 DIAGNOSIS — D509 Iron deficiency anemia, unspecified: Secondary | ICD-10-CM | POA: Diagnosis not present

## 2016-10-31 DIAGNOSIS — N2581 Secondary hyperparathyroidism of renal origin: Secondary | ICD-10-CM | POA: Diagnosis not present

## 2016-10-31 DIAGNOSIS — N186 End stage renal disease: Secondary | ICD-10-CM | POA: Diagnosis not present

## 2016-10-31 DIAGNOSIS — D631 Anemia in chronic kidney disease: Secondary | ICD-10-CM | POA: Diagnosis not present

## 2016-10-31 DIAGNOSIS — Z79899 Other long term (current) drug therapy: Secondary | ICD-10-CM | POA: Diagnosis not present

## 2016-10-31 DIAGNOSIS — K769 Liver disease, unspecified: Secondary | ICD-10-CM | POA: Diagnosis not present

## 2016-11-01 DIAGNOSIS — D631 Anemia in chronic kidney disease: Secondary | ICD-10-CM | POA: Diagnosis not present

## 2016-11-01 DIAGNOSIS — K769 Liver disease, unspecified: Secondary | ICD-10-CM | POA: Diagnosis not present

## 2016-11-01 DIAGNOSIS — N186 End stage renal disease: Secondary | ICD-10-CM | POA: Diagnosis not present

## 2016-11-01 DIAGNOSIS — D509 Iron deficiency anemia, unspecified: Secondary | ICD-10-CM | POA: Diagnosis not present

## 2016-11-01 DIAGNOSIS — N2581 Secondary hyperparathyroidism of renal origin: Secondary | ICD-10-CM | POA: Diagnosis not present

## 2016-11-01 DIAGNOSIS — Z79899 Other long term (current) drug therapy: Secondary | ICD-10-CM | POA: Diagnosis not present

## 2016-11-02 DIAGNOSIS — K769 Liver disease, unspecified: Secondary | ICD-10-CM | POA: Diagnosis not present

## 2016-11-02 DIAGNOSIS — Z79899 Other long term (current) drug therapy: Secondary | ICD-10-CM | POA: Diagnosis not present

## 2016-11-02 DIAGNOSIS — N186 End stage renal disease: Secondary | ICD-10-CM | POA: Diagnosis not present

## 2016-11-02 DIAGNOSIS — D631 Anemia in chronic kidney disease: Secondary | ICD-10-CM | POA: Diagnosis not present

## 2016-11-02 DIAGNOSIS — N2581 Secondary hyperparathyroidism of renal origin: Secondary | ICD-10-CM | POA: Diagnosis not present

## 2016-11-02 DIAGNOSIS — D509 Iron deficiency anemia, unspecified: Secondary | ICD-10-CM | POA: Diagnosis not present

## 2016-11-03 DIAGNOSIS — Z79899 Other long term (current) drug therapy: Secondary | ICD-10-CM | POA: Diagnosis not present

## 2016-11-03 DIAGNOSIS — D631 Anemia in chronic kidney disease: Secondary | ICD-10-CM | POA: Diagnosis not present

## 2016-11-03 DIAGNOSIS — K769 Liver disease, unspecified: Secondary | ICD-10-CM | POA: Diagnosis not present

## 2016-11-03 DIAGNOSIS — D509 Iron deficiency anemia, unspecified: Secondary | ICD-10-CM | POA: Diagnosis not present

## 2016-11-03 DIAGNOSIS — N186 End stage renal disease: Secondary | ICD-10-CM | POA: Diagnosis not present

## 2016-11-03 DIAGNOSIS — N2581 Secondary hyperparathyroidism of renal origin: Secondary | ICD-10-CM | POA: Diagnosis not present

## 2016-11-04 DIAGNOSIS — D631 Anemia in chronic kidney disease: Secondary | ICD-10-CM | POA: Diagnosis not present

## 2016-11-04 DIAGNOSIS — N2581 Secondary hyperparathyroidism of renal origin: Secondary | ICD-10-CM | POA: Diagnosis not present

## 2016-11-04 DIAGNOSIS — D509 Iron deficiency anemia, unspecified: Secondary | ICD-10-CM | POA: Diagnosis not present

## 2016-11-04 DIAGNOSIS — N186 End stage renal disease: Secondary | ICD-10-CM | POA: Diagnosis not present

## 2016-11-04 DIAGNOSIS — Z79899 Other long term (current) drug therapy: Secondary | ICD-10-CM | POA: Diagnosis not present

## 2016-11-04 DIAGNOSIS — K769 Liver disease, unspecified: Secondary | ICD-10-CM | POA: Diagnosis not present

## 2016-11-05 DIAGNOSIS — N2581 Secondary hyperparathyroidism of renal origin: Secondary | ICD-10-CM | POA: Diagnosis not present

## 2016-11-05 DIAGNOSIS — N186 End stage renal disease: Secondary | ICD-10-CM | POA: Diagnosis not present

## 2016-11-05 DIAGNOSIS — D631 Anemia in chronic kidney disease: Secondary | ICD-10-CM | POA: Diagnosis not present

## 2016-11-05 DIAGNOSIS — Z79899 Other long term (current) drug therapy: Secondary | ICD-10-CM | POA: Diagnosis not present

## 2016-11-05 DIAGNOSIS — D509 Iron deficiency anemia, unspecified: Secondary | ICD-10-CM | POA: Diagnosis not present

## 2016-11-05 DIAGNOSIS — K769 Liver disease, unspecified: Secondary | ICD-10-CM | POA: Diagnosis not present

## 2016-11-06 DIAGNOSIS — N186 End stage renal disease: Secondary | ICD-10-CM | POA: Diagnosis not present

## 2016-11-06 DIAGNOSIS — Z79899 Other long term (current) drug therapy: Secondary | ICD-10-CM | POA: Diagnosis not present

## 2016-11-06 DIAGNOSIS — D631 Anemia in chronic kidney disease: Secondary | ICD-10-CM | POA: Diagnosis not present

## 2016-11-06 DIAGNOSIS — N2581 Secondary hyperparathyroidism of renal origin: Secondary | ICD-10-CM | POA: Diagnosis not present

## 2016-11-06 DIAGNOSIS — K769 Liver disease, unspecified: Secondary | ICD-10-CM | POA: Diagnosis not present

## 2016-11-06 DIAGNOSIS — D509 Iron deficiency anemia, unspecified: Secondary | ICD-10-CM | POA: Diagnosis not present

## 2016-11-07 DIAGNOSIS — K769 Liver disease, unspecified: Secondary | ICD-10-CM | POA: Diagnosis not present

## 2016-11-07 DIAGNOSIS — Z79899 Other long term (current) drug therapy: Secondary | ICD-10-CM | POA: Diagnosis not present

## 2016-11-07 DIAGNOSIS — N2581 Secondary hyperparathyroidism of renal origin: Secondary | ICD-10-CM | POA: Diagnosis not present

## 2016-11-07 DIAGNOSIS — N186 End stage renal disease: Secondary | ICD-10-CM | POA: Diagnosis not present

## 2016-11-07 DIAGNOSIS — D509 Iron deficiency anemia, unspecified: Secondary | ICD-10-CM | POA: Diagnosis not present

## 2016-11-07 DIAGNOSIS — D631 Anemia in chronic kidney disease: Secondary | ICD-10-CM | POA: Diagnosis not present

## 2016-11-08 DIAGNOSIS — Z79899 Other long term (current) drug therapy: Secondary | ICD-10-CM | POA: Diagnosis not present

## 2016-11-08 DIAGNOSIS — N2581 Secondary hyperparathyroidism of renal origin: Secondary | ICD-10-CM | POA: Diagnosis not present

## 2016-11-08 DIAGNOSIS — N186 End stage renal disease: Secondary | ICD-10-CM | POA: Diagnosis not present

## 2016-11-08 DIAGNOSIS — D631 Anemia in chronic kidney disease: Secondary | ICD-10-CM | POA: Diagnosis not present

## 2016-11-08 DIAGNOSIS — D509 Iron deficiency anemia, unspecified: Secondary | ICD-10-CM | POA: Diagnosis not present

## 2016-11-08 DIAGNOSIS — K769 Liver disease, unspecified: Secondary | ICD-10-CM | POA: Diagnosis not present

## 2016-11-09 DIAGNOSIS — D509 Iron deficiency anemia, unspecified: Secondary | ICD-10-CM | POA: Diagnosis not present

## 2016-11-09 DIAGNOSIS — K769 Liver disease, unspecified: Secondary | ICD-10-CM | POA: Diagnosis not present

## 2016-11-09 DIAGNOSIS — D631 Anemia in chronic kidney disease: Secondary | ICD-10-CM | POA: Diagnosis not present

## 2016-11-09 DIAGNOSIS — N186 End stage renal disease: Secondary | ICD-10-CM | POA: Diagnosis not present

## 2016-11-09 DIAGNOSIS — N2581 Secondary hyperparathyroidism of renal origin: Secondary | ICD-10-CM | POA: Diagnosis not present

## 2016-11-09 DIAGNOSIS — Z79899 Other long term (current) drug therapy: Secondary | ICD-10-CM | POA: Diagnosis not present

## 2016-11-10 DIAGNOSIS — Z79899 Other long term (current) drug therapy: Secondary | ICD-10-CM | POA: Diagnosis not present

## 2016-11-10 DIAGNOSIS — D509 Iron deficiency anemia, unspecified: Secondary | ICD-10-CM | POA: Diagnosis not present

## 2016-11-10 DIAGNOSIS — D631 Anemia in chronic kidney disease: Secondary | ICD-10-CM | POA: Diagnosis not present

## 2016-11-10 DIAGNOSIS — N2581 Secondary hyperparathyroidism of renal origin: Secondary | ICD-10-CM | POA: Diagnosis not present

## 2016-11-10 DIAGNOSIS — N186 End stage renal disease: Secondary | ICD-10-CM | POA: Diagnosis not present

## 2016-11-10 DIAGNOSIS — K769 Liver disease, unspecified: Secondary | ICD-10-CM | POA: Diagnosis not present

## 2016-11-11 DIAGNOSIS — N186 End stage renal disease: Secondary | ICD-10-CM | POA: Diagnosis not present

## 2016-11-11 DIAGNOSIS — K769 Liver disease, unspecified: Secondary | ICD-10-CM | POA: Diagnosis not present

## 2016-11-11 DIAGNOSIS — D631 Anemia in chronic kidney disease: Secondary | ICD-10-CM | POA: Diagnosis not present

## 2016-11-11 DIAGNOSIS — D509 Iron deficiency anemia, unspecified: Secondary | ICD-10-CM | POA: Diagnosis not present

## 2016-11-11 DIAGNOSIS — Z79899 Other long term (current) drug therapy: Secondary | ICD-10-CM | POA: Diagnosis not present

## 2016-11-11 DIAGNOSIS — N2581 Secondary hyperparathyroidism of renal origin: Secondary | ICD-10-CM | POA: Diagnosis not present

## 2016-11-12 DIAGNOSIS — K769 Liver disease, unspecified: Secondary | ICD-10-CM | POA: Diagnosis not present

## 2016-11-12 DIAGNOSIS — N186 End stage renal disease: Secondary | ICD-10-CM | POA: Diagnosis not present

## 2016-11-12 DIAGNOSIS — N2581 Secondary hyperparathyroidism of renal origin: Secondary | ICD-10-CM | POA: Diagnosis not present

## 2016-11-12 DIAGNOSIS — D509 Iron deficiency anemia, unspecified: Secondary | ICD-10-CM | POA: Diagnosis not present

## 2016-11-12 DIAGNOSIS — Z79899 Other long term (current) drug therapy: Secondary | ICD-10-CM | POA: Diagnosis not present

## 2016-11-12 DIAGNOSIS — D631 Anemia in chronic kidney disease: Secondary | ICD-10-CM | POA: Diagnosis not present

## 2016-11-13 DIAGNOSIS — N186 End stage renal disease: Secondary | ICD-10-CM | POA: Diagnosis not present

## 2016-11-13 DIAGNOSIS — D509 Iron deficiency anemia, unspecified: Secondary | ICD-10-CM | POA: Diagnosis not present

## 2016-11-13 DIAGNOSIS — N2581 Secondary hyperparathyroidism of renal origin: Secondary | ICD-10-CM | POA: Diagnosis not present

## 2016-11-13 DIAGNOSIS — D631 Anemia in chronic kidney disease: Secondary | ICD-10-CM | POA: Diagnosis not present

## 2016-11-13 DIAGNOSIS — Z79899 Other long term (current) drug therapy: Secondary | ICD-10-CM | POA: Diagnosis not present

## 2016-11-13 DIAGNOSIS — K769 Liver disease, unspecified: Secondary | ICD-10-CM | POA: Diagnosis not present

## 2016-11-14 DIAGNOSIS — D631 Anemia in chronic kidney disease: Secondary | ICD-10-CM | POA: Diagnosis not present

## 2016-11-14 DIAGNOSIS — Z79899 Other long term (current) drug therapy: Secondary | ICD-10-CM | POA: Diagnosis not present

## 2016-11-14 DIAGNOSIS — D509 Iron deficiency anemia, unspecified: Secondary | ICD-10-CM | POA: Diagnosis not present

## 2016-11-14 DIAGNOSIS — K769 Liver disease, unspecified: Secondary | ICD-10-CM | POA: Diagnosis not present

## 2016-11-14 DIAGNOSIS — N2581 Secondary hyperparathyroidism of renal origin: Secondary | ICD-10-CM | POA: Diagnosis not present

## 2016-11-14 DIAGNOSIS — N186 End stage renal disease: Secondary | ICD-10-CM | POA: Diagnosis not present

## 2016-11-15 DIAGNOSIS — K769 Liver disease, unspecified: Secondary | ICD-10-CM | POA: Diagnosis not present

## 2016-11-15 DIAGNOSIS — Z79899 Other long term (current) drug therapy: Secondary | ICD-10-CM | POA: Diagnosis not present

## 2016-11-15 DIAGNOSIS — D631 Anemia in chronic kidney disease: Secondary | ICD-10-CM | POA: Diagnosis not present

## 2016-11-15 DIAGNOSIS — N2581 Secondary hyperparathyroidism of renal origin: Secondary | ICD-10-CM | POA: Diagnosis not present

## 2016-11-15 DIAGNOSIS — N186 End stage renal disease: Secondary | ICD-10-CM | POA: Diagnosis not present

## 2016-11-15 DIAGNOSIS — D509 Iron deficiency anemia, unspecified: Secondary | ICD-10-CM | POA: Diagnosis not present

## 2016-11-16 DIAGNOSIS — K769 Liver disease, unspecified: Secondary | ICD-10-CM | POA: Diagnosis not present

## 2016-11-16 DIAGNOSIS — Z79899 Other long term (current) drug therapy: Secondary | ICD-10-CM | POA: Diagnosis not present

## 2016-11-16 DIAGNOSIS — N2581 Secondary hyperparathyroidism of renal origin: Secondary | ICD-10-CM | POA: Diagnosis not present

## 2016-11-16 DIAGNOSIS — D631 Anemia in chronic kidney disease: Secondary | ICD-10-CM | POA: Diagnosis not present

## 2016-11-16 DIAGNOSIS — D509 Iron deficiency anemia, unspecified: Secondary | ICD-10-CM | POA: Diagnosis not present

## 2016-11-16 DIAGNOSIS — N186 End stage renal disease: Secondary | ICD-10-CM | POA: Diagnosis not present

## 2016-11-17 DIAGNOSIS — K769 Liver disease, unspecified: Secondary | ICD-10-CM | POA: Diagnosis not present

## 2016-11-17 DIAGNOSIS — N2581 Secondary hyperparathyroidism of renal origin: Secondary | ICD-10-CM | POA: Diagnosis not present

## 2016-11-17 DIAGNOSIS — D631 Anemia in chronic kidney disease: Secondary | ICD-10-CM | POA: Diagnosis not present

## 2016-11-17 DIAGNOSIS — Z79899 Other long term (current) drug therapy: Secondary | ICD-10-CM | POA: Diagnosis not present

## 2016-11-17 DIAGNOSIS — D509 Iron deficiency anemia, unspecified: Secondary | ICD-10-CM | POA: Diagnosis not present

## 2016-11-17 DIAGNOSIS — N186 End stage renal disease: Secondary | ICD-10-CM | POA: Diagnosis not present

## 2016-11-18 DIAGNOSIS — N186 End stage renal disease: Secondary | ICD-10-CM | POA: Diagnosis not present

## 2016-11-18 DIAGNOSIS — Z79899 Other long term (current) drug therapy: Secondary | ICD-10-CM | POA: Diagnosis not present

## 2016-11-18 DIAGNOSIS — D631 Anemia in chronic kidney disease: Secondary | ICD-10-CM | POA: Diagnosis not present

## 2016-11-18 DIAGNOSIS — D509 Iron deficiency anemia, unspecified: Secondary | ICD-10-CM | POA: Diagnosis not present

## 2016-11-18 DIAGNOSIS — N2581 Secondary hyperparathyroidism of renal origin: Secondary | ICD-10-CM | POA: Diagnosis not present

## 2016-11-18 DIAGNOSIS — K769 Liver disease, unspecified: Secondary | ICD-10-CM | POA: Diagnosis not present

## 2016-11-19 DIAGNOSIS — K769 Liver disease, unspecified: Secondary | ICD-10-CM | POA: Diagnosis not present

## 2016-11-19 DIAGNOSIS — D509 Iron deficiency anemia, unspecified: Secondary | ICD-10-CM | POA: Diagnosis not present

## 2016-11-19 DIAGNOSIS — D631 Anemia in chronic kidney disease: Secondary | ICD-10-CM | POA: Diagnosis not present

## 2016-11-19 DIAGNOSIS — N186 End stage renal disease: Secondary | ICD-10-CM | POA: Diagnosis not present

## 2016-11-19 DIAGNOSIS — N2581 Secondary hyperparathyroidism of renal origin: Secondary | ICD-10-CM | POA: Diagnosis not present

## 2016-11-19 DIAGNOSIS — Z79899 Other long term (current) drug therapy: Secondary | ICD-10-CM | POA: Diagnosis not present

## 2016-11-20 DIAGNOSIS — Z79899 Other long term (current) drug therapy: Secondary | ICD-10-CM | POA: Diagnosis not present

## 2016-11-20 DIAGNOSIS — N186 End stage renal disease: Secondary | ICD-10-CM | POA: Diagnosis not present

## 2016-11-20 DIAGNOSIS — K769 Liver disease, unspecified: Secondary | ICD-10-CM | POA: Diagnosis not present

## 2016-11-20 DIAGNOSIS — D509 Iron deficiency anemia, unspecified: Secondary | ICD-10-CM | POA: Diagnosis not present

## 2016-11-20 DIAGNOSIS — N2581 Secondary hyperparathyroidism of renal origin: Secondary | ICD-10-CM | POA: Diagnosis not present

## 2016-11-20 DIAGNOSIS — T8612 Kidney transplant failure: Secondary | ICD-10-CM | POA: Diagnosis not present

## 2016-11-20 DIAGNOSIS — Z992 Dependence on renal dialysis: Secondary | ICD-10-CM | POA: Diagnosis not present

## 2016-11-20 DIAGNOSIS — D631 Anemia in chronic kidney disease: Secondary | ICD-10-CM | POA: Diagnosis not present

## 2016-11-21 DIAGNOSIS — D509 Iron deficiency anemia, unspecified: Secondary | ICD-10-CM | POA: Diagnosis not present

## 2016-11-21 DIAGNOSIS — N186 End stage renal disease: Secondary | ICD-10-CM | POA: Diagnosis not present

## 2016-11-21 DIAGNOSIS — K769 Liver disease, unspecified: Secondary | ICD-10-CM | POA: Diagnosis not present

## 2016-11-21 DIAGNOSIS — E46 Unspecified protein-calorie malnutrition: Secondary | ICD-10-CM | POA: Diagnosis not present

## 2016-11-21 DIAGNOSIS — D631 Anemia in chronic kidney disease: Secondary | ICD-10-CM | POA: Diagnosis not present

## 2016-11-21 DIAGNOSIS — Z79899 Other long term (current) drug therapy: Secondary | ICD-10-CM | POA: Diagnosis not present

## 2016-11-21 DIAGNOSIS — N2589 Other disorders resulting from impaired renal tubular function: Secondary | ICD-10-CM | POA: Diagnosis not present

## 2016-11-21 DIAGNOSIS — N2581 Secondary hyperparathyroidism of renal origin: Secondary | ICD-10-CM | POA: Diagnosis not present

## 2016-11-22 DIAGNOSIS — K769 Liver disease, unspecified: Secondary | ICD-10-CM | POA: Diagnosis not present

## 2016-11-22 DIAGNOSIS — D631 Anemia in chronic kidney disease: Secondary | ICD-10-CM | POA: Diagnosis not present

## 2016-11-22 DIAGNOSIS — N2581 Secondary hyperparathyroidism of renal origin: Secondary | ICD-10-CM | POA: Diagnosis not present

## 2016-11-22 DIAGNOSIS — E46 Unspecified protein-calorie malnutrition: Secondary | ICD-10-CM | POA: Diagnosis not present

## 2016-11-22 DIAGNOSIS — N186 End stage renal disease: Secondary | ICD-10-CM | POA: Diagnosis not present

## 2016-11-22 DIAGNOSIS — Z79899 Other long term (current) drug therapy: Secondary | ICD-10-CM | POA: Diagnosis not present

## 2016-11-23 DIAGNOSIS — Z79899 Other long term (current) drug therapy: Secondary | ICD-10-CM | POA: Diagnosis not present

## 2016-11-23 DIAGNOSIS — N2581 Secondary hyperparathyroidism of renal origin: Secondary | ICD-10-CM | POA: Diagnosis not present

## 2016-11-23 DIAGNOSIS — N186 End stage renal disease: Secondary | ICD-10-CM | POA: Diagnosis not present

## 2016-11-23 DIAGNOSIS — K769 Liver disease, unspecified: Secondary | ICD-10-CM | POA: Diagnosis not present

## 2016-11-23 DIAGNOSIS — E46 Unspecified protein-calorie malnutrition: Secondary | ICD-10-CM | POA: Diagnosis not present

## 2016-11-23 DIAGNOSIS — D631 Anemia in chronic kidney disease: Secondary | ICD-10-CM | POA: Diagnosis not present

## 2016-11-24 DIAGNOSIS — N186 End stage renal disease: Secondary | ICD-10-CM | POA: Diagnosis not present

## 2016-11-24 DIAGNOSIS — E46 Unspecified protein-calorie malnutrition: Secondary | ICD-10-CM | POA: Diagnosis not present

## 2016-11-24 DIAGNOSIS — K769 Liver disease, unspecified: Secondary | ICD-10-CM | POA: Diagnosis not present

## 2016-11-24 DIAGNOSIS — Z79899 Other long term (current) drug therapy: Secondary | ICD-10-CM | POA: Diagnosis not present

## 2016-11-24 DIAGNOSIS — D631 Anemia in chronic kidney disease: Secondary | ICD-10-CM | POA: Diagnosis not present

## 2016-11-24 DIAGNOSIS — N2581 Secondary hyperparathyroidism of renal origin: Secondary | ICD-10-CM | POA: Diagnosis not present

## 2016-11-25 DIAGNOSIS — K769 Liver disease, unspecified: Secondary | ICD-10-CM | POA: Diagnosis not present

## 2016-11-25 DIAGNOSIS — E46 Unspecified protein-calorie malnutrition: Secondary | ICD-10-CM | POA: Diagnosis not present

## 2016-11-25 DIAGNOSIS — D631 Anemia in chronic kidney disease: Secondary | ICD-10-CM | POA: Diagnosis not present

## 2016-11-25 DIAGNOSIS — Z79899 Other long term (current) drug therapy: Secondary | ICD-10-CM | POA: Diagnosis not present

## 2016-11-25 DIAGNOSIS — N2581 Secondary hyperparathyroidism of renal origin: Secondary | ICD-10-CM | POA: Diagnosis not present

## 2016-11-25 DIAGNOSIS — N186 End stage renal disease: Secondary | ICD-10-CM | POA: Diagnosis not present

## 2016-11-26 DIAGNOSIS — D631 Anemia in chronic kidney disease: Secondary | ICD-10-CM | POA: Diagnosis not present

## 2016-11-26 DIAGNOSIS — E46 Unspecified protein-calorie malnutrition: Secondary | ICD-10-CM | POA: Diagnosis not present

## 2016-11-26 DIAGNOSIS — N186 End stage renal disease: Secondary | ICD-10-CM | POA: Diagnosis not present

## 2016-11-26 DIAGNOSIS — N2581 Secondary hyperparathyroidism of renal origin: Secondary | ICD-10-CM | POA: Diagnosis not present

## 2016-11-26 DIAGNOSIS — Z79899 Other long term (current) drug therapy: Secondary | ICD-10-CM | POA: Diagnosis not present

## 2016-11-26 DIAGNOSIS — R8299 Other abnormal findings in urine: Secondary | ICD-10-CM | POA: Diagnosis not present

## 2016-11-26 DIAGNOSIS — K769 Liver disease, unspecified: Secondary | ICD-10-CM | POA: Diagnosis not present

## 2016-11-27 DIAGNOSIS — N186 End stage renal disease: Secondary | ICD-10-CM | POA: Diagnosis not present

## 2016-11-27 DIAGNOSIS — D631 Anemia in chronic kidney disease: Secondary | ICD-10-CM | POA: Diagnosis not present

## 2016-11-27 DIAGNOSIS — N2581 Secondary hyperparathyroidism of renal origin: Secondary | ICD-10-CM | POA: Diagnosis not present

## 2016-11-27 DIAGNOSIS — K769 Liver disease, unspecified: Secondary | ICD-10-CM | POA: Diagnosis not present

## 2016-11-27 DIAGNOSIS — Z79899 Other long term (current) drug therapy: Secondary | ICD-10-CM | POA: Diagnosis not present

## 2016-11-27 DIAGNOSIS — E46 Unspecified protein-calorie malnutrition: Secondary | ICD-10-CM | POA: Diagnosis not present

## 2016-11-28 DIAGNOSIS — D631 Anemia in chronic kidney disease: Secondary | ICD-10-CM | POA: Diagnosis not present

## 2016-11-28 DIAGNOSIS — Z79899 Other long term (current) drug therapy: Secondary | ICD-10-CM | POA: Diagnosis not present

## 2016-11-28 DIAGNOSIS — E46 Unspecified protein-calorie malnutrition: Secondary | ICD-10-CM | POA: Diagnosis not present

## 2016-11-28 DIAGNOSIS — N2581 Secondary hyperparathyroidism of renal origin: Secondary | ICD-10-CM | POA: Diagnosis not present

## 2016-11-28 DIAGNOSIS — K769 Liver disease, unspecified: Secondary | ICD-10-CM | POA: Diagnosis not present

## 2016-11-28 DIAGNOSIS — N186 End stage renal disease: Secondary | ICD-10-CM | POA: Diagnosis not present

## 2016-11-29 DIAGNOSIS — D631 Anemia in chronic kidney disease: Secondary | ICD-10-CM | POA: Diagnosis not present

## 2016-11-29 DIAGNOSIS — N186 End stage renal disease: Secondary | ICD-10-CM | POA: Diagnosis not present

## 2016-11-29 DIAGNOSIS — N2581 Secondary hyperparathyroidism of renal origin: Secondary | ICD-10-CM | POA: Diagnosis not present

## 2016-11-29 DIAGNOSIS — K769 Liver disease, unspecified: Secondary | ICD-10-CM | POA: Diagnosis not present

## 2016-11-29 DIAGNOSIS — Z79899 Other long term (current) drug therapy: Secondary | ICD-10-CM | POA: Diagnosis not present

## 2016-11-29 DIAGNOSIS — E46 Unspecified protein-calorie malnutrition: Secondary | ICD-10-CM | POA: Diagnosis not present

## 2016-11-30 DIAGNOSIS — Z79899 Other long term (current) drug therapy: Secondary | ICD-10-CM | POA: Diagnosis not present

## 2016-11-30 DIAGNOSIS — E46 Unspecified protein-calorie malnutrition: Secondary | ICD-10-CM | POA: Diagnosis not present

## 2016-11-30 DIAGNOSIS — D631 Anemia in chronic kidney disease: Secondary | ICD-10-CM | POA: Diagnosis not present

## 2016-11-30 DIAGNOSIS — N2581 Secondary hyperparathyroidism of renal origin: Secondary | ICD-10-CM | POA: Diagnosis not present

## 2016-11-30 DIAGNOSIS — K769 Liver disease, unspecified: Secondary | ICD-10-CM | POA: Diagnosis not present

## 2016-11-30 DIAGNOSIS — N186 End stage renal disease: Secondary | ICD-10-CM | POA: Diagnosis not present

## 2016-12-01 DIAGNOSIS — D631 Anemia in chronic kidney disease: Secondary | ICD-10-CM | POA: Diagnosis not present

## 2016-12-01 DIAGNOSIS — N2581 Secondary hyperparathyroidism of renal origin: Secondary | ICD-10-CM | POA: Diagnosis not present

## 2016-12-01 DIAGNOSIS — E46 Unspecified protein-calorie malnutrition: Secondary | ICD-10-CM | POA: Diagnosis not present

## 2016-12-01 DIAGNOSIS — N186 End stage renal disease: Secondary | ICD-10-CM | POA: Diagnosis not present

## 2016-12-01 DIAGNOSIS — K769 Liver disease, unspecified: Secondary | ICD-10-CM | POA: Diagnosis not present

## 2016-12-01 DIAGNOSIS — Z79899 Other long term (current) drug therapy: Secondary | ICD-10-CM | POA: Diagnosis not present

## 2016-12-02 DIAGNOSIS — Z79899 Other long term (current) drug therapy: Secondary | ICD-10-CM | POA: Diagnosis not present

## 2016-12-02 DIAGNOSIS — N186 End stage renal disease: Secondary | ICD-10-CM | POA: Diagnosis not present

## 2016-12-02 DIAGNOSIS — N2581 Secondary hyperparathyroidism of renal origin: Secondary | ICD-10-CM | POA: Diagnosis not present

## 2016-12-02 DIAGNOSIS — E46 Unspecified protein-calorie malnutrition: Secondary | ICD-10-CM | POA: Diagnosis not present

## 2016-12-02 DIAGNOSIS — K769 Liver disease, unspecified: Secondary | ICD-10-CM | POA: Diagnosis not present

## 2016-12-02 DIAGNOSIS — D631 Anemia in chronic kidney disease: Secondary | ICD-10-CM | POA: Diagnosis not present

## 2016-12-03 DIAGNOSIS — D631 Anemia in chronic kidney disease: Secondary | ICD-10-CM | POA: Diagnosis not present

## 2016-12-03 DIAGNOSIS — N2581 Secondary hyperparathyroidism of renal origin: Secondary | ICD-10-CM | POA: Diagnosis not present

## 2016-12-03 DIAGNOSIS — K769 Liver disease, unspecified: Secondary | ICD-10-CM | POA: Diagnosis not present

## 2016-12-03 DIAGNOSIS — Z79899 Other long term (current) drug therapy: Secondary | ICD-10-CM | POA: Diagnosis not present

## 2016-12-03 DIAGNOSIS — N186 End stage renal disease: Secondary | ICD-10-CM | POA: Diagnosis not present

## 2016-12-03 DIAGNOSIS — E46 Unspecified protein-calorie malnutrition: Secondary | ICD-10-CM | POA: Diagnosis not present

## 2016-12-04 DIAGNOSIS — Z79899 Other long term (current) drug therapy: Secondary | ICD-10-CM | POA: Diagnosis not present

## 2016-12-04 DIAGNOSIS — D631 Anemia in chronic kidney disease: Secondary | ICD-10-CM | POA: Diagnosis not present

## 2016-12-04 DIAGNOSIS — E46 Unspecified protein-calorie malnutrition: Secondary | ICD-10-CM | POA: Diagnosis not present

## 2016-12-04 DIAGNOSIS — N2581 Secondary hyperparathyroidism of renal origin: Secondary | ICD-10-CM | POA: Diagnosis not present

## 2016-12-04 DIAGNOSIS — N186 End stage renal disease: Secondary | ICD-10-CM | POA: Diagnosis not present

## 2016-12-04 DIAGNOSIS — K769 Liver disease, unspecified: Secondary | ICD-10-CM | POA: Diagnosis not present

## 2016-12-05 DIAGNOSIS — N2581 Secondary hyperparathyroidism of renal origin: Secondary | ICD-10-CM | POA: Diagnosis not present

## 2016-12-05 DIAGNOSIS — E46 Unspecified protein-calorie malnutrition: Secondary | ICD-10-CM | POA: Diagnosis not present

## 2016-12-05 DIAGNOSIS — K769 Liver disease, unspecified: Secondary | ICD-10-CM | POA: Diagnosis not present

## 2016-12-05 DIAGNOSIS — Z79899 Other long term (current) drug therapy: Secondary | ICD-10-CM | POA: Diagnosis not present

## 2016-12-05 DIAGNOSIS — D631 Anemia in chronic kidney disease: Secondary | ICD-10-CM | POA: Diagnosis not present

## 2016-12-05 DIAGNOSIS — N186 End stage renal disease: Secondary | ICD-10-CM | POA: Diagnosis not present

## 2016-12-06 DIAGNOSIS — D631 Anemia in chronic kidney disease: Secondary | ICD-10-CM | POA: Diagnosis not present

## 2016-12-06 DIAGNOSIS — K769 Liver disease, unspecified: Secondary | ICD-10-CM | POA: Diagnosis not present

## 2016-12-06 DIAGNOSIS — E46 Unspecified protein-calorie malnutrition: Secondary | ICD-10-CM | POA: Diagnosis not present

## 2016-12-06 DIAGNOSIS — N2581 Secondary hyperparathyroidism of renal origin: Secondary | ICD-10-CM | POA: Diagnosis not present

## 2016-12-06 DIAGNOSIS — Z79899 Other long term (current) drug therapy: Secondary | ICD-10-CM | POA: Diagnosis not present

## 2016-12-06 DIAGNOSIS — N186 End stage renal disease: Secondary | ICD-10-CM | POA: Diagnosis not present

## 2016-12-07 DIAGNOSIS — Z79899 Other long term (current) drug therapy: Secondary | ICD-10-CM | POA: Diagnosis not present

## 2016-12-07 DIAGNOSIS — E46 Unspecified protein-calorie malnutrition: Secondary | ICD-10-CM | POA: Diagnosis not present

## 2016-12-07 DIAGNOSIS — N186 End stage renal disease: Secondary | ICD-10-CM | POA: Diagnosis not present

## 2016-12-07 DIAGNOSIS — D631 Anemia in chronic kidney disease: Secondary | ICD-10-CM | POA: Diagnosis not present

## 2016-12-07 DIAGNOSIS — K769 Liver disease, unspecified: Secondary | ICD-10-CM | POA: Diagnosis not present

## 2016-12-07 DIAGNOSIS — N2581 Secondary hyperparathyroidism of renal origin: Secondary | ICD-10-CM | POA: Diagnosis not present

## 2016-12-08 DIAGNOSIS — N186 End stage renal disease: Secondary | ICD-10-CM | POA: Diagnosis not present

## 2016-12-08 DIAGNOSIS — E46 Unspecified protein-calorie malnutrition: Secondary | ICD-10-CM | POA: Diagnosis not present

## 2016-12-08 DIAGNOSIS — K769 Liver disease, unspecified: Secondary | ICD-10-CM | POA: Diagnosis not present

## 2016-12-08 DIAGNOSIS — N2581 Secondary hyperparathyroidism of renal origin: Secondary | ICD-10-CM | POA: Diagnosis not present

## 2016-12-08 DIAGNOSIS — Z79899 Other long term (current) drug therapy: Secondary | ICD-10-CM | POA: Diagnosis not present

## 2016-12-08 DIAGNOSIS — D631 Anemia in chronic kidney disease: Secondary | ICD-10-CM | POA: Diagnosis not present

## 2016-12-09 DIAGNOSIS — N186 End stage renal disease: Secondary | ICD-10-CM | POA: Diagnosis not present

## 2016-12-09 DIAGNOSIS — D631 Anemia in chronic kidney disease: Secondary | ICD-10-CM | POA: Diagnosis not present

## 2016-12-09 DIAGNOSIS — K769 Liver disease, unspecified: Secondary | ICD-10-CM | POA: Diagnosis not present

## 2016-12-09 DIAGNOSIS — E46 Unspecified protein-calorie malnutrition: Secondary | ICD-10-CM | POA: Diagnosis not present

## 2016-12-09 DIAGNOSIS — N2581 Secondary hyperparathyroidism of renal origin: Secondary | ICD-10-CM | POA: Diagnosis not present

## 2016-12-09 DIAGNOSIS — Z79899 Other long term (current) drug therapy: Secondary | ICD-10-CM | POA: Diagnosis not present

## 2016-12-10 DIAGNOSIS — E46 Unspecified protein-calorie malnutrition: Secondary | ICD-10-CM | POA: Diagnosis not present

## 2016-12-10 DIAGNOSIS — Z79899 Other long term (current) drug therapy: Secondary | ICD-10-CM | POA: Diagnosis not present

## 2016-12-10 DIAGNOSIS — D631 Anemia in chronic kidney disease: Secondary | ICD-10-CM | POA: Diagnosis not present

## 2016-12-10 DIAGNOSIS — K769 Liver disease, unspecified: Secondary | ICD-10-CM | POA: Diagnosis not present

## 2016-12-10 DIAGNOSIS — N2581 Secondary hyperparathyroidism of renal origin: Secondary | ICD-10-CM | POA: Diagnosis not present

## 2016-12-10 DIAGNOSIS — N186 End stage renal disease: Secondary | ICD-10-CM | POA: Diagnosis not present

## 2016-12-11 DIAGNOSIS — N2581 Secondary hyperparathyroidism of renal origin: Secondary | ICD-10-CM | POA: Diagnosis not present

## 2016-12-11 DIAGNOSIS — D631 Anemia in chronic kidney disease: Secondary | ICD-10-CM | POA: Diagnosis not present

## 2016-12-11 DIAGNOSIS — N186 End stage renal disease: Secondary | ICD-10-CM | POA: Diagnosis not present

## 2016-12-11 DIAGNOSIS — Z79899 Other long term (current) drug therapy: Secondary | ICD-10-CM | POA: Diagnosis not present

## 2016-12-11 DIAGNOSIS — E46 Unspecified protein-calorie malnutrition: Secondary | ICD-10-CM | POA: Diagnosis not present

## 2016-12-11 DIAGNOSIS — K769 Liver disease, unspecified: Secondary | ICD-10-CM | POA: Diagnosis not present

## 2016-12-12 DIAGNOSIS — D631 Anemia in chronic kidney disease: Secondary | ICD-10-CM | POA: Diagnosis not present

## 2016-12-12 DIAGNOSIS — N186 End stage renal disease: Secondary | ICD-10-CM | POA: Diagnosis not present

## 2016-12-12 DIAGNOSIS — K769 Liver disease, unspecified: Secondary | ICD-10-CM | POA: Diagnosis not present

## 2016-12-12 DIAGNOSIS — N2581 Secondary hyperparathyroidism of renal origin: Secondary | ICD-10-CM | POA: Diagnosis not present

## 2016-12-12 DIAGNOSIS — E46 Unspecified protein-calorie malnutrition: Secondary | ICD-10-CM | POA: Diagnosis not present

## 2016-12-12 DIAGNOSIS — Z79899 Other long term (current) drug therapy: Secondary | ICD-10-CM | POA: Diagnosis not present

## 2016-12-13 DIAGNOSIS — E46 Unspecified protein-calorie malnutrition: Secondary | ICD-10-CM | POA: Diagnosis not present

## 2016-12-13 DIAGNOSIS — Z79899 Other long term (current) drug therapy: Secondary | ICD-10-CM | POA: Diagnosis not present

## 2016-12-13 DIAGNOSIS — N2581 Secondary hyperparathyroidism of renal origin: Secondary | ICD-10-CM | POA: Diagnosis not present

## 2016-12-13 DIAGNOSIS — D631 Anemia in chronic kidney disease: Secondary | ICD-10-CM | POA: Diagnosis not present

## 2016-12-13 DIAGNOSIS — N186 End stage renal disease: Secondary | ICD-10-CM | POA: Diagnosis not present

## 2016-12-13 DIAGNOSIS — K769 Liver disease, unspecified: Secondary | ICD-10-CM | POA: Diagnosis not present

## 2016-12-14 DIAGNOSIS — Z79899 Other long term (current) drug therapy: Secondary | ICD-10-CM | POA: Diagnosis not present

## 2016-12-14 DIAGNOSIS — N2581 Secondary hyperparathyroidism of renal origin: Secondary | ICD-10-CM | POA: Diagnosis not present

## 2016-12-14 DIAGNOSIS — K769 Liver disease, unspecified: Secondary | ICD-10-CM | POA: Diagnosis not present

## 2016-12-14 DIAGNOSIS — D631 Anemia in chronic kidney disease: Secondary | ICD-10-CM | POA: Diagnosis not present

## 2016-12-14 DIAGNOSIS — N186 End stage renal disease: Secondary | ICD-10-CM | POA: Diagnosis not present

## 2016-12-14 DIAGNOSIS — E46 Unspecified protein-calorie malnutrition: Secondary | ICD-10-CM | POA: Diagnosis not present

## 2016-12-15 DIAGNOSIS — N2581 Secondary hyperparathyroidism of renal origin: Secondary | ICD-10-CM | POA: Diagnosis not present

## 2016-12-15 DIAGNOSIS — N186 End stage renal disease: Secondary | ICD-10-CM | POA: Diagnosis not present

## 2016-12-15 DIAGNOSIS — E46 Unspecified protein-calorie malnutrition: Secondary | ICD-10-CM | POA: Diagnosis not present

## 2016-12-15 DIAGNOSIS — D631 Anemia in chronic kidney disease: Secondary | ICD-10-CM | POA: Diagnosis not present

## 2016-12-15 DIAGNOSIS — K769 Liver disease, unspecified: Secondary | ICD-10-CM | POA: Diagnosis not present

## 2016-12-15 DIAGNOSIS — Z79899 Other long term (current) drug therapy: Secondary | ICD-10-CM | POA: Diagnosis not present

## 2016-12-16 DIAGNOSIS — D631 Anemia in chronic kidney disease: Secondary | ICD-10-CM | POA: Diagnosis not present

## 2016-12-16 DIAGNOSIS — N186 End stage renal disease: Secondary | ICD-10-CM | POA: Diagnosis not present

## 2016-12-16 DIAGNOSIS — K769 Liver disease, unspecified: Secondary | ICD-10-CM | POA: Diagnosis not present

## 2016-12-16 DIAGNOSIS — N2581 Secondary hyperparathyroidism of renal origin: Secondary | ICD-10-CM | POA: Diagnosis not present

## 2016-12-16 DIAGNOSIS — E46 Unspecified protein-calorie malnutrition: Secondary | ICD-10-CM | POA: Diagnosis not present

## 2016-12-16 DIAGNOSIS — Z79899 Other long term (current) drug therapy: Secondary | ICD-10-CM | POA: Diagnosis not present

## 2016-12-17 DIAGNOSIS — N186 End stage renal disease: Secondary | ICD-10-CM | POA: Diagnosis not present

## 2016-12-17 DIAGNOSIS — D631 Anemia in chronic kidney disease: Secondary | ICD-10-CM | POA: Diagnosis not present

## 2016-12-17 DIAGNOSIS — E46 Unspecified protein-calorie malnutrition: Secondary | ICD-10-CM | POA: Diagnosis not present

## 2016-12-17 DIAGNOSIS — N2581 Secondary hyperparathyroidism of renal origin: Secondary | ICD-10-CM | POA: Diagnosis not present

## 2016-12-17 DIAGNOSIS — K769 Liver disease, unspecified: Secondary | ICD-10-CM | POA: Diagnosis not present

## 2016-12-17 DIAGNOSIS — Z79899 Other long term (current) drug therapy: Secondary | ICD-10-CM | POA: Diagnosis not present

## 2016-12-18 DIAGNOSIS — N186 End stage renal disease: Secondary | ICD-10-CM | POA: Diagnosis not present

## 2016-12-18 DIAGNOSIS — D631 Anemia in chronic kidney disease: Secondary | ICD-10-CM | POA: Diagnosis not present

## 2016-12-18 DIAGNOSIS — E46 Unspecified protein-calorie malnutrition: Secondary | ICD-10-CM | POA: Diagnosis not present

## 2016-12-18 DIAGNOSIS — K769 Liver disease, unspecified: Secondary | ICD-10-CM | POA: Diagnosis not present

## 2016-12-18 DIAGNOSIS — N2581 Secondary hyperparathyroidism of renal origin: Secondary | ICD-10-CM | POA: Diagnosis not present

## 2016-12-18 DIAGNOSIS — Z79899 Other long term (current) drug therapy: Secondary | ICD-10-CM | POA: Diagnosis not present

## 2016-12-19 DIAGNOSIS — D631 Anemia in chronic kidney disease: Secondary | ICD-10-CM | POA: Diagnosis not present

## 2016-12-19 DIAGNOSIS — N2581 Secondary hyperparathyroidism of renal origin: Secondary | ICD-10-CM | POA: Diagnosis not present

## 2016-12-19 DIAGNOSIS — Z79899 Other long term (current) drug therapy: Secondary | ICD-10-CM | POA: Diagnosis not present

## 2016-12-19 DIAGNOSIS — K769 Liver disease, unspecified: Secondary | ICD-10-CM | POA: Diagnosis not present

## 2016-12-19 DIAGNOSIS — E46 Unspecified protein-calorie malnutrition: Secondary | ICD-10-CM | POA: Diagnosis not present

## 2016-12-19 DIAGNOSIS — N186 End stage renal disease: Secondary | ICD-10-CM | POA: Diagnosis not present

## 2016-12-20 DIAGNOSIS — N2581 Secondary hyperparathyroidism of renal origin: Secondary | ICD-10-CM | POA: Diagnosis not present

## 2016-12-20 DIAGNOSIS — N186 End stage renal disease: Secondary | ICD-10-CM | POA: Diagnosis not present

## 2016-12-20 DIAGNOSIS — E46 Unspecified protein-calorie malnutrition: Secondary | ICD-10-CM | POA: Diagnosis not present

## 2016-12-20 DIAGNOSIS — K769 Liver disease, unspecified: Secondary | ICD-10-CM | POA: Diagnosis not present

## 2016-12-20 DIAGNOSIS — Z79899 Other long term (current) drug therapy: Secondary | ICD-10-CM | POA: Diagnosis not present

## 2016-12-20 DIAGNOSIS — D631 Anemia in chronic kidney disease: Secondary | ICD-10-CM | POA: Diagnosis not present

## 2016-12-21 DIAGNOSIS — N186 End stage renal disease: Secondary | ICD-10-CM | POA: Diagnosis not present

## 2016-12-21 DIAGNOSIS — D631 Anemia in chronic kidney disease: Secondary | ICD-10-CM | POA: Diagnosis not present

## 2016-12-21 DIAGNOSIS — Z79899 Other long term (current) drug therapy: Secondary | ICD-10-CM | POA: Diagnosis not present

## 2016-12-21 DIAGNOSIS — Z992 Dependence on renal dialysis: Secondary | ICD-10-CM | POA: Diagnosis not present

## 2016-12-21 DIAGNOSIS — K769 Liver disease, unspecified: Secondary | ICD-10-CM | POA: Diagnosis not present

## 2016-12-21 DIAGNOSIS — E46 Unspecified protein-calorie malnutrition: Secondary | ICD-10-CM | POA: Diagnosis not present

## 2016-12-21 DIAGNOSIS — N2581 Secondary hyperparathyroidism of renal origin: Secondary | ICD-10-CM | POA: Diagnosis not present

## 2016-12-21 DIAGNOSIS — T8612 Kidney transplant failure: Secondary | ICD-10-CM | POA: Diagnosis not present

## 2016-12-22 DIAGNOSIS — N2589 Other disorders resulting from impaired renal tubular function: Secondary | ICD-10-CM | POA: Diagnosis not present

## 2016-12-22 DIAGNOSIS — N2581 Secondary hyperparathyroidism of renal origin: Secondary | ICD-10-CM | POA: Diagnosis not present

## 2016-12-22 DIAGNOSIS — D509 Iron deficiency anemia, unspecified: Secondary | ICD-10-CM | POA: Diagnosis not present

## 2016-12-22 DIAGNOSIS — Z4931 Encounter for adequacy testing for hemodialysis: Secondary | ICD-10-CM | POA: Diagnosis not present

## 2016-12-22 DIAGNOSIS — N186 End stage renal disease: Secondary | ICD-10-CM | POA: Diagnosis not present

## 2016-12-22 DIAGNOSIS — D631 Anemia in chronic kidney disease: Secondary | ICD-10-CM | POA: Diagnosis not present

## 2016-12-22 DIAGNOSIS — K769 Liver disease, unspecified: Secondary | ICD-10-CM | POA: Diagnosis not present

## 2016-12-23 DIAGNOSIS — D631 Anemia in chronic kidney disease: Secondary | ICD-10-CM | POA: Diagnosis not present

## 2016-12-23 DIAGNOSIS — N2589 Other disorders resulting from impaired renal tubular function: Secondary | ICD-10-CM | POA: Diagnosis not present

## 2016-12-23 DIAGNOSIS — D509 Iron deficiency anemia, unspecified: Secondary | ICD-10-CM | POA: Diagnosis not present

## 2016-12-23 DIAGNOSIS — N2581 Secondary hyperparathyroidism of renal origin: Secondary | ICD-10-CM | POA: Diagnosis not present

## 2016-12-23 DIAGNOSIS — K769 Liver disease, unspecified: Secondary | ICD-10-CM | POA: Diagnosis not present

## 2016-12-23 DIAGNOSIS — N186 End stage renal disease: Secondary | ICD-10-CM | POA: Diagnosis not present

## 2016-12-24 DIAGNOSIS — N2581 Secondary hyperparathyroidism of renal origin: Secondary | ICD-10-CM | POA: Diagnosis not present

## 2016-12-24 DIAGNOSIS — D509 Iron deficiency anemia, unspecified: Secondary | ICD-10-CM | POA: Diagnosis not present

## 2016-12-24 DIAGNOSIS — E784 Other hyperlipidemia: Secondary | ICD-10-CM | POA: Diagnosis not present

## 2016-12-24 DIAGNOSIS — E039 Hypothyroidism, unspecified: Secondary | ICD-10-CM | POA: Diagnosis not present

## 2016-12-24 DIAGNOSIS — N2589 Other disorders resulting from impaired renal tubular function: Secondary | ICD-10-CM | POA: Diagnosis not present

## 2016-12-24 DIAGNOSIS — E119 Type 2 diabetes mellitus without complications: Secondary | ICD-10-CM | POA: Diagnosis not present

## 2016-12-24 DIAGNOSIS — N186 End stage renal disease: Secondary | ICD-10-CM | POA: Diagnosis not present

## 2016-12-24 DIAGNOSIS — K769 Liver disease, unspecified: Secondary | ICD-10-CM | POA: Diagnosis not present

## 2016-12-24 DIAGNOSIS — R8299 Other abnormal findings in urine: Secondary | ICD-10-CM | POA: Diagnosis not present

## 2016-12-24 DIAGNOSIS — E785 Hyperlipidemia, unspecified: Secondary | ICD-10-CM | POA: Diagnosis not present

## 2016-12-24 DIAGNOSIS — D631 Anemia in chronic kidney disease: Secondary | ICD-10-CM | POA: Diagnosis not present

## 2016-12-25 DIAGNOSIS — K769 Liver disease, unspecified: Secondary | ICD-10-CM | POA: Diagnosis not present

## 2016-12-25 DIAGNOSIS — D631 Anemia in chronic kidney disease: Secondary | ICD-10-CM | POA: Diagnosis not present

## 2016-12-25 DIAGNOSIS — N2581 Secondary hyperparathyroidism of renal origin: Secondary | ICD-10-CM | POA: Diagnosis not present

## 2016-12-25 DIAGNOSIS — N2589 Other disorders resulting from impaired renal tubular function: Secondary | ICD-10-CM | POA: Diagnosis not present

## 2016-12-25 DIAGNOSIS — N186 End stage renal disease: Secondary | ICD-10-CM | POA: Diagnosis not present

## 2016-12-25 DIAGNOSIS — D509 Iron deficiency anemia, unspecified: Secondary | ICD-10-CM | POA: Diagnosis not present

## 2016-12-26 DIAGNOSIS — K769 Liver disease, unspecified: Secondary | ICD-10-CM | POA: Diagnosis not present

## 2016-12-26 DIAGNOSIS — N2581 Secondary hyperparathyroidism of renal origin: Secondary | ICD-10-CM | POA: Diagnosis not present

## 2016-12-26 DIAGNOSIS — N186 End stage renal disease: Secondary | ICD-10-CM | POA: Diagnosis not present

## 2016-12-26 DIAGNOSIS — D631 Anemia in chronic kidney disease: Secondary | ICD-10-CM | POA: Diagnosis not present

## 2016-12-26 DIAGNOSIS — L821 Other seborrheic keratosis: Secondary | ICD-10-CM | POA: Diagnosis not present

## 2016-12-26 DIAGNOSIS — D509 Iron deficiency anemia, unspecified: Secondary | ICD-10-CM | POA: Diagnosis not present

## 2016-12-26 DIAGNOSIS — N2589 Other disorders resulting from impaired renal tubular function: Secondary | ICD-10-CM | POA: Diagnosis not present

## 2016-12-27 DIAGNOSIS — N2589 Other disorders resulting from impaired renal tubular function: Secondary | ICD-10-CM | POA: Diagnosis not present

## 2016-12-27 DIAGNOSIS — D509 Iron deficiency anemia, unspecified: Secondary | ICD-10-CM | POA: Diagnosis not present

## 2016-12-27 DIAGNOSIS — N186 End stage renal disease: Secondary | ICD-10-CM | POA: Diagnosis not present

## 2016-12-27 DIAGNOSIS — D631 Anemia in chronic kidney disease: Secondary | ICD-10-CM | POA: Diagnosis not present

## 2016-12-27 DIAGNOSIS — N2581 Secondary hyperparathyroidism of renal origin: Secondary | ICD-10-CM | POA: Diagnosis not present

## 2016-12-27 DIAGNOSIS — K769 Liver disease, unspecified: Secondary | ICD-10-CM | POA: Diagnosis not present

## 2016-12-28 DIAGNOSIS — D509 Iron deficiency anemia, unspecified: Secondary | ICD-10-CM | POA: Diagnosis not present

## 2016-12-28 DIAGNOSIS — N2581 Secondary hyperparathyroidism of renal origin: Secondary | ICD-10-CM | POA: Diagnosis not present

## 2016-12-28 DIAGNOSIS — N186 End stage renal disease: Secondary | ICD-10-CM | POA: Diagnosis not present

## 2016-12-28 DIAGNOSIS — N2589 Other disorders resulting from impaired renal tubular function: Secondary | ICD-10-CM | POA: Diagnosis not present

## 2016-12-28 DIAGNOSIS — K769 Liver disease, unspecified: Secondary | ICD-10-CM | POA: Diagnosis not present

## 2016-12-28 DIAGNOSIS — D631 Anemia in chronic kidney disease: Secondary | ICD-10-CM | POA: Diagnosis not present

## 2016-12-29 DIAGNOSIS — D631 Anemia in chronic kidney disease: Secondary | ICD-10-CM | POA: Diagnosis not present

## 2016-12-29 DIAGNOSIS — N2589 Other disorders resulting from impaired renal tubular function: Secondary | ICD-10-CM | POA: Diagnosis not present

## 2016-12-29 DIAGNOSIS — K769 Liver disease, unspecified: Secondary | ICD-10-CM | POA: Diagnosis not present

## 2016-12-29 DIAGNOSIS — N186 End stage renal disease: Secondary | ICD-10-CM | POA: Diagnosis not present

## 2016-12-29 DIAGNOSIS — D509 Iron deficiency anemia, unspecified: Secondary | ICD-10-CM | POA: Diagnosis not present

## 2016-12-29 DIAGNOSIS — N2581 Secondary hyperparathyroidism of renal origin: Secondary | ICD-10-CM | POA: Diagnosis not present

## 2016-12-30 DIAGNOSIS — D509 Iron deficiency anemia, unspecified: Secondary | ICD-10-CM | POA: Diagnosis not present

## 2016-12-30 DIAGNOSIS — N2581 Secondary hyperparathyroidism of renal origin: Secondary | ICD-10-CM | POA: Diagnosis not present

## 2016-12-30 DIAGNOSIS — K769 Liver disease, unspecified: Secondary | ICD-10-CM | POA: Diagnosis not present

## 2016-12-30 DIAGNOSIS — N186 End stage renal disease: Secondary | ICD-10-CM | POA: Diagnosis not present

## 2016-12-30 DIAGNOSIS — N2589 Other disorders resulting from impaired renal tubular function: Secondary | ICD-10-CM | POA: Diagnosis not present

## 2016-12-30 DIAGNOSIS — D631 Anemia in chronic kidney disease: Secondary | ICD-10-CM | POA: Diagnosis not present

## 2016-12-31 DIAGNOSIS — K769 Liver disease, unspecified: Secondary | ICD-10-CM | POA: Diagnosis not present

## 2016-12-31 DIAGNOSIS — D631 Anemia in chronic kidney disease: Secondary | ICD-10-CM | POA: Diagnosis not present

## 2016-12-31 DIAGNOSIS — N2581 Secondary hyperparathyroidism of renal origin: Secondary | ICD-10-CM | POA: Diagnosis not present

## 2016-12-31 DIAGNOSIS — N2589 Other disorders resulting from impaired renal tubular function: Secondary | ICD-10-CM | POA: Diagnosis not present

## 2016-12-31 DIAGNOSIS — N186 End stage renal disease: Secondary | ICD-10-CM | POA: Diagnosis not present

## 2016-12-31 DIAGNOSIS — D509 Iron deficiency anemia, unspecified: Secondary | ICD-10-CM | POA: Diagnosis not present

## 2017-01-01 DIAGNOSIS — K769 Liver disease, unspecified: Secondary | ICD-10-CM | POA: Diagnosis not present

## 2017-01-01 DIAGNOSIS — D509 Iron deficiency anemia, unspecified: Secondary | ICD-10-CM | POA: Diagnosis not present

## 2017-01-01 DIAGNOSIS — N186 End stage renal disease: Secondary | ICD-10-CM | POA: Diagnosis not present

## 2017-01-01 DIAGNOSIS — N2589 Other disorders resulting from impaired renal tubular function: Secondary | ICD-10-CM | POA: Diagnosis not present

## 2017-01-01 DIAGNOSIS — N2581 Secondary hyperparathyroidism of renal origin: Secondary | ICD-10-CM | POA: Diagnosis not present

## 2017-01-01 DIAGNOSIS — D631 Anemia in chronic kidney disease: Secondary | ICD-10-CM | POA: Diagnosis not present

## 2017-01-02 DIAGNOSIS — N2581 Secondary hyperparathyroidism of renal origin: Secondary | ICD-10-CM | POA: Diagnosis not present

## 2017-01-02 DIAGNOSIS — N186 End stage renal disease: Secondary | ICD-10-CM | POA: Diagnosis not present

## 2017-01-02 DIAGNOSIS — D509 Iron deficiency anemia, unspecified: Secondary | ICD-10-CM | POA: Diagnosis not present

## 2017-01-02 DIAGNOSIS — D631 Anemia in chronic kidney disease: Secondary | ICD-10-CM | POA: Diagnosis not present

## 2017-01-02 DIAGNOSIS — N2589 Other disorders resulting from impaired renal tubular function: Secondary | ICD-10-CM | POA: Diagnosis not present

## 2017-01-02 DIAGNOSIS — K769 Liver disease, unspecified: Secondary | ICD-10-CM | POA: Diagnosis not present

## 2017-01-03 DIAGNOSIS — N2589 Other disorders resulting from impaired renal tubular function: Secondary | ICD-10-CM | POA: Diagnosis not present

## 2017-01-03 DIAGNOSIS — N2581 Secondary hyperparathyroidism of renal origin: Secondary | ICD-10-CM | POA: Diagnosis not present

## 2017-01-03 DIAGNOSIS — K769 Liver disease, unspecified: Secondary | ICD-10-CM | POA: Diagnosis not present

## 2017-01-03 DIAGNOSIS — D631 Anemia in chronic kidney disease: Secondary | ICD-10-CM | POA: Diagnosis not present

## 2017-01-03 DIAGNOSIS — D509 Iron deficiency anemia, unspecified: Secondary | ICD-10-CM | POA: Diagnosis not present

## 2017-01-03 DIAGNOSIS — N186 End stage renal disease: Secondary | ICD-10-CM | POA: Diagnosis not present

## 2017-01-04 DIAGNOSIS — N186 End stage renal disease: Secondary | ICD-10-CM | POA: Diagnosis not present

## 2017-01-04 DIAGNOSIS — D631 Anemia in chronic kidney disease: Secondary | ICD-10-CM | POA: Diagnosis not present

## 2017-01-04 DIAGNOSIS — K769 Liver disease, unspecified: Secondary | ICD-10-CM | POA: Diagnosis not present

## 2017-01-04 DIAGNOSIS — N2589 Other disorders resulting from impaired renal tubular function: Secondary | ICD-10-CM | POA: Diagnosis not present

## 2017-01-04 DIAGNOSIS — N2581 Secondary hyperparathyroidism of renal origin: Secondary | ICD-10-CM | POA: Diagnosis not present

## 2017-01-04 DIAGNOSIS — D509 Iron deficiency anemia, unspecified: Secondary | ICD-10-CM | POA: Diagnosis not present

## 2017-01-05 DIAGNOSIS — N2589 Other disorders resulting from impaired renal tubular function: Secondary | ICD-10-CM | POA: Diagnosis not present

## 2017-01-05 DIAGNOSIS — D631 Anemia in chronic kidney disease: Secondary | ICD-10-CM | POA: Diagnosis not present

## 2017-01-05 DIAGNOSIS — N186 End stage renal disease: Secondary | ICD-10-CM | POA: Diagnosis not present

## 2017-01-05 DIAGNOSIS — K769 Liver disease, unspecified: Secondary | ICD-10-CM | POA: Diagnosis not present

## 2017-01-05 DIAGNOSIS — N2581 Secondary hyperparathyroidism of renal origin: Secondary | ICD-10-CM | POA: Diagnosis not present

## 2017-01-05 DIAGNOSIS — D509 Iron deficiency anemia, unspecified: Secondary | ICD-10-CM | POA: Diagnosis not present

## 2017-01-06 DIAGNOSIS — N186 End stage renal disease: Secondary | ICD-10-CM | POA: Diagnosis not present

## 2017-01-06 DIAGNOSIS — D631 Anemia in chronic kidney disease: Secondary | ICD-10-CM | POA: Diagnosis not present

## 2017-01-06 DIAGNOSIS — K769 Liver disease, unspecified: Secondary | ICD-10-CM | POA: Diagnosis not present

## 2017-01-06 DIAGNOSIS — N2589 Other disorders resulting from impaired renal tubular function: Secondary | ICD-10-CM | POA: Diagnosis not present

## 2017-01-06 DIAGNOSIS — N2581 Secondary hyperparathyroidism of renal origin: Secondary | ICD-10-CM | POA: Diagnosis not present

## 2017-01-06 DIAGNOSIS — D509 Iron deficiency anemia, unspecified: Secondary | ICD-10-CM | POA: Diagnosis not present

## 2017-01-07 DIAGNOSIS — N2589 Other disorders resulting from impaired renal tubular function: Secondary | ICD-10-CM | POA: Diagnosis not present

## 2017-01-07 DIAGNOSIS — N186 End stage renal disease: Secondary | ICD-10-CM | POA: Diagnosis not present

## 2017-01-07 DIAGNOSIS — D631 Anemia in chronic kidney disease: Secondary | ICD-10-CM | POA: Diagnosis not present

## 2017-01-07 DIAGNOSIS — K769 Liver disease, unspecified: Secondary | ICD-10-CM | POA: Diagnosis not present

## 2017-01-07 DIAGNOSIS — D509 Iron deficiency anemia, unspecified: Secondary | ICD-10-CM | POA: Diagnosis not present

## 2017-01-07 DIAGNOSIS — N2581 Secondary hyperparathyroidism of renal origin: Secondary | ICD-10-CM | POA: Diagnosis not present

## 2017-01-08 DIAGNOSIS — D509 Iron deficiency anemia, unspecified: Secondary | ICD-10-CM | POA: Diagnosis not present

## 2017-01-08 DIAGNOSIS — N2589 Other disorders resulting from impaired renal tubular function: Secondary | ICD-10-CM | POA: Diagnosis not present

## 2017-01-08 DIAGNOSIS — N186 End stage renal disease: Secondary | ICD-10-CM | POA: Diagnosis not present

## 2017-01-08 DIAGNOSIS — N2581 Secondary hyperparathyroidism of renal origin: Secondary | ICD-10-CM | POA: Diagnosis not present

## 2017-01-08 DIAGNOSIS — D631 Anemia in chronic kidney disease: Secondary | ICD-10-CM | POA: Diagnosis not present

## 2017-01-08 DIAGNOSIS — K769 Liver disease, unspecified: Secondary | ICD-10-CM | POA: Diagnosis not present

## 2017-01-09 DIAGNOSIS — N2589 Other disorders resulting from impaired renal tubular function: Secondary | ICD-10-CM | POA: Diagnosis not present

## 2017-01-09 DIAGNOSIS — D631 Anemia in chronic kidney disease: Secondary | ICD-10-CM | POA: Diagnosis not present

## 2017-01-09 DIAGNOSIS — D509 Iron deficiency anemia, unspecified: Secondary | ICD-10-CM | POA: Diagnosis not present

## 2017-01-09 DIAGNOSIS — N2581 Secondary hyperparathyroidism of renal origin: Secondary | ICD-10-CM | POA: Diagnosis not present

## 2017-01-09 DIAGNOSIS — N186 End stage renal disease: Secondary | ICD-10-CM | POA: Diagnosis not present

## 2017-01-09 DIAGNOSIS — K769 Liver disease, unspecified: Secondary | ICD-10-CM | POA: Diagnosis not present

## 2017-01-10 DIAGNOSIS — D631 Anemia in chronic kidney disease: Secondary | ICD-10-CM | POA: Diagnosis not present

## 2017-01-10 DIAGNOSIS — K769 Liver disease, unspecified: Secondary | ICD-10-CM | POA: Diagnosis not present

## 2017-01-10 DIAGNOSIS — N2581 Secondary hyperparathyroidism of renal origin: Secondary | ICD-10-CM | POA: Diagnosis not present

## 2017-01-10 DIAGNOSIS — D509 Iron deficiency anemia, unspecified: Secondary | ICD-10-CM | POA: Diagnosis not present

## 2017-01-10 DIAGNOSIS — N186 End stage renal disease: Secondary | ICD-10-CM | POA: Diagnosis not present

## 2017-01-10 DIAGNOSIS — N2589 Other disorders resulting from impaired renal tubular function: Secondary | ICD-10-CM | POA: Diagnosis not present

## 2017-01-11 DIAGNOSIS — D631 Anemia in chronic kidney disease: Secondary | ICD-10-CM | POA: Diagnosis not present

## 2017-01-11 DIAGNOSIS — D509 Iron deficiency anemia, unspecified: Secondary | ICD-10-CM | POA: Diagnosis not present

## 2017-01-11 DIAGNOSIS — N186 End stage renal disease: Secondary | ICD-10-CM | POA: Diagnosis not present

## 2017-01-11 DIAGNOSIS — K769 Liver disease, unspecified: Secondary | ICD-10-CM | POA: Diagnosis not present

## 2017-01-11 DIAGNOSIS — N2589 Other disorders resulting from impaired renal tubular function: Secondary | ICD-10-CM | POA: Diagnosis not present

## 2017-01-11 DIAGNOSIS — N2581 Secondary hyperparathyroidism of renal origin: Secondary | ICD-10-CM | POA: Diagnosis not present

## 2017-01-12 DIAGNOSIS — D509 Iron deficiency anemia, unspecified: Secondary | ICD-10-CM | POA: Diagnosis not present

## 2017-01-12 DIAGNOSIS — N2589 Other disorders resulting from impaired renal tubular function: Secondary | ICD-10-CM | POA: Diagnosis not present

## 2017-01-12 DIAGNOSIS — N2581 Secondary hyperparathyroidism of renal origin: Secondary | ICD-10-CM | POA: Diagnosis not present

## 2017-01-12 DIAGNOSIS — D631 Anemia in chronic kidney disease: Secondary | ICD-10-CM | POA: Diagnosis not present

## 2017-01-12 DIAGNOSIS — N186 End stage renal disease: Secondary | ICD-10-CM | POA: Diagnosis not present

## 2017-01-12 DIAGNOSIS — K769 Liver disease, unspecified: Secondary | ICD-10-CM | POA: Diagnosis not present

## 2017-01-13 DIAGNOSIS — D631 Anemia in chronic kidney disease: Secondary | ICD-10-CM | POA: Diagnosis not present

## 2017-01-13 DIAGNOSIS — N2589 Other disorders resulting from impaired renal tubular function: Secondary | ICD-10-CM | POA: Diagnosis not present

## 2017-01-13 DIAGNOSIS — N2581 Secondary hyperparathyroidism of renal origin: Secondary | ICD-10-CM | POA: Diagnosis not present

## 2017-01-13 DIAGNOSIS — K769 Liver disease, unspecified: Secondary | ICD-10-CM | POA: Diagnosis not present

## 2017-01-13 DIAGNOSIS — D509 Iron deficiency anemia, unspecified: Secondary | ICD-10-CM | POA: Diagnosis not present

## 2017-01-13 DIAGNOSIS — N186 End stage renal disease: Secondary | ICD-10-CM | POA: Diagnosis not present

## 2017-01-14 DIAGNOSIS — N186 End stage renal disease: Secondary | ICD-10-CM | POA: Diagnosis not present

## 2017-01-14 DIAGNOSIS — N2589 Other disorders resulting from impaired renal tubular function: Secondary | ICD-10-CM | POA: Diagnosis not present

## 2017-01-14 DIAGNOSIS — N2581 Secondary hyperparathyroidism of renal origin: Secondary | ICD-10-CM | POA: Diagnosis not present

## 2017-01-14 DIAGNOSIS — D631 Anemia in chronic kidney disease: Secondary | ICD-10-CM | POA: Diagnosis not present

## 2017-01-14 DIAGNOSIS — K769 Liver disease, unspecified: Secondary | ICD-10-CM | POA: Diagnosis not present

## 2017-01-14 DIAGNOSIS — D509 Iron deficiency anemia, unspecified: Secondary | ICD-10-CM | POA: Diagnosis not present

## 2017-01-15 DIAGNOSIS — D509 Iron deficiency anemia, unspecified: Secondary | ICD-10-CM | POA: Diagnosis not present

## 2017-01-15 DIAGNOSIS — K769 Liver disease, unspecified: Secondary | ICD-10-CM | POA: Diagnosis not present

## 2017-01-15 DIAGNOSIS — N2581 Secondary hyperparathyroidism of renal origin: Secondary | ICD-10-CM | POA: Diagnosis not present

## 2017-01-15 DIAGNOSIS — N2589 Other disorders resulting from impaired renal tubular function: Secondary | ICD-10-CM | POA: Diagnosis not present

## 2017-01-15 DIAGNOSIS — N186 End stage renal disease: Secondary | ICD-10-CM | POA: Diagnosis not present

## 2017-01-15 DIAGNOSIS — D631 Anemia in chronic kidney disease: Secondary | ICD-10-CM | POA: Diagnosis not present

## 2017-01-16 DIAGNOSIS — N2589 Other disorders resulting from impaired renal tubular function: Secondary | ICD-10-CM | POA: Diagnosis not present

## 2017-01-16 DIAGNOSIS — N2581 Secondary hyperparathyroidism of renal origin: Secondary | ICD-10-CM | POA: Diagnosis not present

## 2017-01-16 DIAGNOSIS — K769 Liver disease, unspecified: Secondary | ICD-10-CM | POA: Diagnosis not present

## 2017-01-16 DIAGNOSIS — N186 End stage renal disease: Secondary | ICD-10-CM | POA: Diagnosis not present

## 2017-01-16 DIAGNOSIS — D631 Anemia in chronic kidney disease: Secondary | ICD-10-CM | POA: Diagnosis not present

## 2017-01-16 DIAGNOSIS — D509 Iron deficiency anemia, unspecified: Secondary | ICD-10-CM | POA: Diagnosis not present

## 2017-01-17 DIAGNOSIS — N2581 Secondary hyperparathyroidism of renal origin: Secondary | ICD-10-CM | POA: Diagnosis not present

## 2017-01-17 DIAGNOSIS — K769 Liver disease, unspecified: Secondary | ICD-10-CM | POA: Diagnosis not present

## 2017-01-17 DIAGNOSIS — D631 Anemia in chronic kidney disease: Secondary | ICD-10-CM | POA: Diagnosis not present

## 2017-01-17 DIAGNOSIS — N2589 Other disorders resulting from impaired renal tubular function: Secondary | ICD-10-CM | POA: Diagnosis not present

## 2017-01-17 DIAGNOSIS — N186 End stage renal disease: Secondary | ICD-10-CM | POA: Diagnosis not present

## 2017-01-17 DIAGNOSIS — D509 Iron deficiency anemia, unspecified: Secondary | ICD-10-CM | POA: Diagnosis not present

## 2017-01-17 DIAGNOSIS — F4320 Adjustment disorder, unspecified: Secondary | ICD-10-CM | POA: Diagnosis not present

## 2017-01-18 DIAGNOSIS — N2581 Secondary hyperparathyroidism of renal origin: Secondary | ICD-10-CM | POA: Diagnosis not present

## 2017-01-18 DIAGNOSIS — N2589 Other disorders resulting from impaired renal tubular function: Secondary | ICD-10-CM | POA: Diagnosis not present

## 2017-01-18 DIAGNOSIS — K769 Liver disease, unspecified: Secondary | ICD-10-CM | POA: Diagnosis not present

## 2017-01-18 DIAGNOSIS — D509 Iron deficiency anemia, unspecified: Secondary | ICD-10-CM | POA: Diagnosis not present

## 2017-01-18 DIAGNOSIS — N186 End stage renal disease: Secondary | ICD-10-CM | POA: Diagnosis not present

## 2017-01-18 DIAGNOSIS — D631 Anemia in chronic kidney disease: Secondary | ICD-10-CM | POA: Diagnosis not present

## 2017-01-19 DIAGNOSIS — N2581 Secondary hyperparathyroidism of renal origin: Secondary | ICD-10-CM | POA: Diagnosis not present

## 2017-01-19 DIAGNOSIS — D509 Iron deficiency anemia, unspecified: Secondary | ICD-10-CM | POA: Diagnosis not present

## 2017-01-19 DIAGNOSIS — N186 End stage renal disease: Secondary | ICD-10-CM | POA: Diagnosis not present

## 2017-01-19 DIAGNOSIS — D631 Anemia in chronic kidney disease: Secondary | ICD-10-CM | POA: Diagnosis not present

## 2017-01-19 DIAGNOSIS — K769 Liver disease, unspecified: Secondary | ICD-10-CM | POA: Diagnosis not present

## 2017-01-19 DIAGNOSIS — N2589 Other disorders resulting from impaired renal tubular function: Secondary | ICD-10-CM | POA: Diagnosis not present

## 2017-01-20 DIAGNOSIS — D631 Anemia in chronic kidney disease: Secondary | ICD-10-CM | POA: Diagnosis not present

## 2017-01-20 DIAGNOSIS — T8612 Kidney transplant failure: Secondary | ICD-10-CM | POA: Diagnosis not present

## 2017-01-20 DIAGNOSIS — N2589 Other disorders resulting from impaired renal tubular function: Secondary | ICD-10-CM | POA: Diagnosis not present

## 2017-01-20 DIAGNOSIS — Z992 Dependence on renal dialysis: Secondary | ICD-10-CM | POA: Diagnosis not present

## 2017-01-20 DIAGNOSIS — N186 End stage renal disease: Secondary | ICD-10-CM | POA: Diagnosis not present

## 2017-01-20 DIAGNOSIS — D509 Iron deficiency anemia, unspecified: Secondary | ICD-10-CM | POA: Diagnosis not present

## 2017-01-20 DIAGNOSIS — N2581 Secondary hyperparathyroidism of renal origin: Secondary | ICD-10-CM | POA: Diagnosis not present

## 2017-01-20 DIAGNOSIS — K769 Liver disease, unspecified: Secondary | ICD-10-CM | POA: Diagnosis not present

## 2017-01-21 DIAGNOSIS — N2589 Other disorders resulting from impaired renal tubular function: Secondary | ICD-10-CM | POA: Diagnosis not present

## 2017-01-21 DIAGNOSIS — D509 Iron deficiency anemia, unspecified: Secondary | ICD-10-CM | POA: Diagnosis not present

## 2017-01-21 DIAGNOSIS — N186 End stage renal disease: Secondary | ICD-10-CM | POA: Diagnosis not present

## 2017-01-21 DIAGNOSIS — N2581 Secondary hyperparathyroidism of renal origin: Secondary | ICD-10-CM | POA: Diagnosis not present

## 2017-01-21 DIAGNOSIS — E46 Unspecified protein-calorie malnutrition: Secondary | ICD-10-CM | POA: Diagnosis not present

## 2017-01-21 DIAGNOSIS — R8299 Other abnormal findings in urine: Secondary | ICD-10-CM | POA: Diagnosis not present

## 2017-01-21 DIAGNOSIS — Z79899 Other long term (current) drug therapy: Secondary | ICD-10-CM | POA: Diagnosis not present

## 2017-01-21 DIAGNOSIS — D631 Anemia in chronic kidney disease: Secondary | ICD-10-CM | POA: Diagnosis not present

## 2017-01-21 DIAGNOSIS — K769 Liver disease, unspecified: Secondary | ICD-10-CM | POA: Diagnosis not present

## 2017-01-22 DIAGNOSIS — D631 Anemia in chronic kidney disease: Secondary | ICD-10-CM | POA: Diagnosis not present

## 2017-01-22 DIAGNOSIS — K769 Liver disease, unspecified: Secondary | ICD-10-CM | POA: Diagnosis not present

## 2017-01-22 DIAGNOSIS — Z79899 Other long term (current) drug therapy: Secondary | ICD-10-CM | POA: Diagnosis not present

## 2017-01-22 DIAGNOSIS — N2589 Other disorders resulting from impaired renal tubular function: Secondary | ICD-10-CM | POA: Diagnosis not present

## 2017-01-22 DIAGNOSIS — N2581 Secondary hyperparathyroidism of renal origin: Secondary | ICD-10-CM | POA: Diagnosis not present

## 2017-01-22 DIAGNOSIS — N186 End stage renal disease: Secondary | ICD-10-CM | POA: Diagnosis not present

## 2017-01-23 DIAGNOSIS — N186 End stage renal disease: Secondary | ICD-10-CM | POA: Diagnosis not present

## 2017-01-23 DIAGNOSIS — N2589 Other disorders resulting from impaired renal tubular function: Secondary | ICD-10-CM | POA: Diagnosis not present

## 2017-01-23 DIAGNOSIS — K769 Liver disease, unspecified: Secondary | ICD-10-CM | POA: Diagnosis not present

## 2017-01-23 DIAGNOSIS — F4320 Adjustment disorder, unspecified: Secondary | ICD-10-CM | POA: Diagnosis not present

## 2017-01-23 DIAGNOSIS — M5134 Other intervertebral disc degeneration, thoracic region: Secondary | ICD-10-CM | POA: Diagnosis not present

## 2017-01-23 DIAGNOSIS — Z79899 Other long term (current) drug therapy: Secondary | ICD-10-CM | POA: Diagnosis not present

## 2017-01-23 DIAGNOSIS — D631 Anemia in chronic kidney disease: Secondary | ICD-10-CM | POA: Diagnosis not present

## 2017-01-23 DIAGNOSIS — M9902 Segmental and somatic dysfunction of thoracic region: Secondary | ICD-10-CM | POA: Diagnosis not present

## 2017-01-23 DIAGNOSIS — N2581 Secondary hyperparathyroidism of renal origin: Secondary | ICD-10-CM | POA: Diagnosis not present

## 2017-01-23 DIAGNOSIS — M50322 Other cervical disc degeneration at C5-C6 level: Secondary | ICD-10-CM | POA: Diagnosis not present

## 2017-01-23 DIAGNOSIS — M9901 Segmental and somatic dysfunction of cervical region: Secondary | ICD-10-CM | POA: Diagnosis not present

## 2017-01-24 DIAGNOSIS — N186 End stage renal disease: Secondary | ICD-10-CM | POA: Diagnosis not present

## 2017-01-24 DIAGNOSIS — N2581 Secondary hyperparathyroidism of renal origin: Secondary | ICD-10-CM | POA: Diagnosis not present

## 2017-01-24 DIAGNOSIS — N2589 Other disorders resulting from impaired renal tubular function: Secondary | ICD-10-CM | POA: Diagnosis not present

## 2017-01-24 DIAGNOSIS — D631 Anemia in chronic kidney disease: Secondary | ICD-10-CM | POA: Diagnosis not present

## 2017-01-24 DIAGNOSIS — Z79899 Other long term (current) drug therapy: Secondary | ICD-10-CM | POA: Diagnosis not present

## 2017-01-24 DIAGNOSIS — K769 Liver disease, unspecified: Secondary | ICD-10-CM | POA: Diagnosis not present

## 2017-01-25 DIAGNOSIS — N2589 Other disorders resulting from impaired renal tubular function: Secondary | ICD-10-CM | POA: Diagnosis not present

## 2017-01-25 DIAGNOSIS — D631 Anemia in chronic kidney disease: Secondary | ICD-10-CM | POA: Diagnosis not present

## 2017-01-25 DIAGNOSIS — Z79899 Other long term (current) drug therapy: Secondary | ICD-10-CM | POA: Diagnosis not present

## 2017-01-25 DIAGNOSIS — N186 End stage renal disease: Secondary | ICD-10-CM | POA: Diagnosis not present

## 2017-01-25 DIAGNOSIS — K769 Liver disease, unspecified: Secondary | ICD-10-CM | POA: Diagnosis not present

## 2017-01-25 DIAGNOSIS — N2581 Secondary hyperparathyroidism of renal origin: Secondary | ICD-10-CM | POA: Diagnosis not present

## 2017-01-26 DIAGNOSIS — K769 Liver disease, unspecified: Secondary | ICD-10-CM | POA: Diagnosis not present

## 2017-01-26 DIAGNOSIS — D631 Anemia in chronic kidney disease: Secondary | ICD-10-CM | POA: Diagnosis not present

## 2017-01-26 DIAGNOSIS — Z79899 Other long term (current) drug therapy: Secondary | ICD-10-CM | POA: Diagnosis not present

## 2017-01-26 DIAGNOSIS — N2589 Other disorders resulting from impaired renal tubular function: Secondary | ICD-10-CM | POA: Diagnosis not present

## 2017-01-26 DIAGNOSIS — N2581 Secondary hyperparathyroidism of renal origin: Secondary | ICD-10-CM | POA: Diagnosis not present

## 2017-01-26 DIAGNOSIS — N186 End stage renal disease: Secondary | ICD-10-CM | POA: Diagnosis not present

## 2017-01-27 DIAGNOSIS — N2581 Secondary hyperparathyroidism of renal origin: Secondary | ICD-10-CM | POA: Diagnosis not present

## 2017-01-27 DIAGNOSIS — N2589 Other disorders resulting from impaired renal tubular function: Secondary | ICD-10-CM | POA: Diagnosis not present

## 2017-01-27 DIAGNOSIS — K769 Liver disease, unspecified: Secondary | ICD-10-CM | POA: Diagnosis not present

## 2017-01-27 DIAGNOSIS — N186 End stage renal disease: Secondary | ICD-10-CM | POA: Diagnosis not present

## 2017-01-27 DIAGNOSIS — D631 Anemia in chronic kidney disease: Secondary | ICD-10-CM | POA: Diagnosis not present

## 2017-01-27 DIAGNOSIS — Z79899 Other long term (current) drug therapy: Secondary | ICD-10-CM | POA: Diagnosis not present

## 2017-01-28 DIAGNOSIS — M50322 Other cervical disc degeneration at C5-C6 level: Secondary | ICD-10-CM | POA: Diagnosis not present

## 2017-01-28 DIAGNOSIS — M9902 Segmental and somatic dysfunction of thoracic region: Secondary | ICD-10-CM | POA: Diagnosis not present

## 2017-01-28 DIAGNOSIS — K769 Liver disease, unspecified: Secondary | ICD-10-CM | POA: Diagnosis not present

## 2017-01-28 DIAGNOSIS — M5134 Other intervertebral disc degeneration, thoracic region: Secondary | ICD-10-CM | POA: Diagnosis not present

## 2017-01-28 DIAGNOSIS — N186 End stage renal disease: Secondary | ICD-10-CM | POA: Diagnosis not present

## 2017-01-28 DIAGNOSIS — D631 Anemia in chronic kidney disease: Secondary | ICD-10-CM | POA: Diagnosis not present

## 2017-01-28 DIAGNOSIS — Z79899 Other long term (current) drug therapy: Secondary | ICD-10-CM | POA: Diagnosis not present

## 2017-01-28 DIAGNOSIS — M9901 Segmental and somatic dysfunction of cervical region: Secondary | ICD-10-CM | POA: Diagnosis not present

## 2017-01-28 DIAGNOSIS — N2581 Secondary hyperparathyroidism of renal origin: Secondary | ICD-10-CM | POA: Diagnosis not present

## 2017-01-28 DIAGNOSIS — N2589 Other disorders resulting from impaired renal tubular function: Secondary | ICD-10-CM | POA: Diagnosis not present

## 2017-01-29 DIAGNOSIS — M9901 Segmental and somatic dysfunction of cervical region: Secondary | ICD-10-CM | POA: Diagnosis not present

## 2017-01-29 DIAGNOSIS — M5134 Other intervertebral disc degeneration, thoracic region: Secondary | ICD-10-CM | POA: Diagnosis not present

## 2017-01-29 DIAGNOSIS — D631 Anemia in chronic kidney disease: Secondary | ICD-10-CM | POA: Diagnosis not present

## 2017-01-29 DIAGNOSIS — N2589 Other disorders resulting from impaired renal tubular function: Secondary | ICD-10-CM | POA: Diagnosis not present

## 2017-01-29 DIAGNOSIS — N186 End stage renal disease: Secondary | ICD-10-CM | POA: Diagnosis not present

## 2017-01-29 DIAGNOSIS — N2581 Secondary hyperparathyroidism of renal origin: Secondary | ICD-10-CM | POA: Diagnosis not present

## 2017-01-29 DIAGNOSIS — M50322 Other cervical disc degeneration at C5-C6 level: Secondary | ICD-10-CM | POA: Diagnosis not present

## 2017-01-29 DIAGNOSIS — M9902 Segmental and somatic dysfunction of thoracic region: Secondary | ICD-10-CM | POA: Diagnosis not present

## 2017-01-29 DIAGNOSIS — K769 Liver disease, unspecified: Secondary | ICD-10-CM | POA: Diagnosis not present

## 2017-01-29 DIAGNOSIS — Z79899 Other long term (current) drug therapy: Secondary | ICD-10-CM | POA: Diagnosis not present

## 2017-01-30 DIAGNOSIS — N186 End stage renal disease: Secondary | ICD-10-CM | POA: Diagnosis not present

## 2017-01-30 DIAGNOSIS — N2589 Other disorders resulting from impaired renal tubular function: Secondary | ICD-10-CM | POA: Diagnosis not present

## 2017-01-30 DIAGNOSIS — K769 Liver disease, unspecified: Secondary | ICD-10-CM | POA: Diagnosis not present

## 2017-01-30 DIAGNOSIS — N2581 Secondary hyperparathyroidism of renal origin: Secondary | ICD-10-CM | POA: Diagnosis not present

## 2017-01-30 DIAGNOSIS — D631 Anemia in chronic kidney disease: Secondary | ICD-10-CM | POA: Diagnosis not present

## 2017-01-30 DIAGNOSIS — Z79899 Other long term (current) drug therapy: Secondary | ICD-10-CM | POA: Diagnosis not present

## 2017-01-31 DIAGNOSIS — K769 Liver disease, unspecified: Secondary | ICD-10-CM | POA: Diagnosis not present

## 2017-01-31 DIAGNOSIS — D631 Anemia in chronic kidney disease: Secondary | ICD-10-CM | POA: Diagnosis not present

## 2017-01-31 DIAGNOSIS — N2589 Other disorders resulting from impaired renal tubular function: Secondary | ICD-10-CM | POA: Diagnosis not present

## 2017-01-31 DIAGNOSIS — N2581 Secondary hyperparathyroidism of renal origin: Secondary | ICD-10-CM | POA: Diagnosis not present

## 2017-01-31 DIAGNOSIS — M9901 Segmental and somatic dysfunction of cervical region: Secondary | ICD-10-CM | POA: Diagnosis not present

## 2017-01-31 DIAGNOSIS — M5134 Other intervertebral disc degeneration, thoracic region: Secondary | ICD-10-CM | POA: Diagnosis not present

## 2017-01-31 DIAGNOSIS — M9902 Segmental and somatic dysfunction of thoracic region: Secondary | ICD-10-CM | POA: Diagnosis not present

## 2017-01-31 DIAGNOSIS — Z79899 Other long term (current) drug therapy: Secondary | ICD-10-CM | POA: Diagnosis not present

## 2017-01-31 DIAGNOSIS — M50322 Other cervical disc degeneration at C5-C6 level: Secondary | ICD-10-CM | POA: Diagnosis not present

## 2017-01-31 DIAGNOSIS — F4320 Adjustment disorder, unspecified: Secondary | ICD-10-CM | POA: Diagnosis not present

## 2017-01-31 DIAGNOSIS — N186 End stage renal disease: Secondary | ICD-10-CM | POA: Diagnosis not present

## 2017-02-01 DIAGNOSIS — N2581 Secondary hyperparathyroidism of renal origin: Secondary | ICD-10-CM | POA: Diagnosis not present

## 2017-02-01 DIAGNOSIS — D631 Anemia in chronic kidney disease: Secondary | ICD-10-CM | POA: Diagnosis not present

## 2017-02-01 DIAGNOSIS — K769 Liver disease, unspecified: Secondary | ICD-10-CM | POA: Diagnosis not present

## 2017-02-01 DIAGNOSIS — Z79899 Other long term (current) drug therapy: Secondary | ICD-10-CM | POA: Diagnosis not present

## 2017-02-01 DIAGNOSIS — N186 End stage renal disease: Secondary | ICD-10-CM | POA: Diagnosis not present

## 2017-02-01 DIAGNOSIS — N2589 Other disorders resulting from impaired renal tubular function: Secondary | ICD-10-CM | POA: Diagnosis not present

## 2017-02-02 DIAGNOSIS — K769 Liver disease, unspecified: Secondary | ICD-10-CM | POA: Diagnosis not present

## 2017-02-02 DIAGNOSIS — N2589 Other disorders resulting from impaired renal tubular function: Secondary | ICD-10-CM | POA: Diagnosis not present

## 2017-02-02 DIAGNOSIS — D631 Anemia in chronic kidney disease: Secondary | ICD-10-CM | POA: Diagnosis not present

## 2017-02-02 DIAGNOSIS — N186 End stage renal disease: Secondary | ICD-10-CM | POA: Diagnosis not present

## 2017-02-02 DIAGNOSIS — N2581 Secondary hyperparathyroidism of renal origin: Secondary | ICD-10-CM | POA: Diagnosis not present

## 2017-02-02 DIAGNOSIS — Z79899 Other long term (current) drug therapy: Secondary | ICD-10-CM | POA: Diagnosis not present

## 2017-02-03 DIAGNOSIS — Z79899 Other long term (current) drug therapy: Secondary | ICD-10-CM | POA: Diagnosis not present

## 2017-02-03 DIAGNOSIS — K769 Liver disease, unspecified: Secondary | ICD-10-CM | POA: Diagnosis not present

## 2017-02-03 DIAGNOSIS — M50322 Other cervical disc degeneration at C5-C6 level: Secondary | ICD-10-CM | POA: Diagnosis not present

## 2017-02-03 DIAGNOSIS — M9901 Segmental and somatic dysfunction of cervical region: Secondary | ICD-10-CM | POA: Diagnosis not present

## 2017-02-03 DIAGNOSIS — M9902 Segmental and somatic dysfunction of thoracic region: Secondary | ICD-10-CM | POA: Diagnosis not present

## 2017-02-03 DIAGNOSIS — N186 End stage renal disease: Secondary | ICD-10-CM | POA: Diagnosis not present

## 2017-02-03 DIAGNOSIS — D631 Anemia in chronic kidney disease: Secondary | ICD-10-CM | POA: Diagnosis not present

## 2017-02-03 DIAGNOSIS — M5134 Other intervertebral disc degeneration, thoracic region: Secondary | ICD-10-CM | POA: Diagnosis not present

## 2017-02-03 DIAGNOSIS — N2589 Other disorders resulting from impaired renal tubular function: Secondary | ICD-10-CM | POA: Diagnosis not present

## 2017-02-03 DIAGNOSIS — N2581 Secondary hyperparathyroidism of renal origin: Secondary | ICD-10-CM | POA: Diagnosis not present

## 2017-02-04 DIAGNOSIS — N186 End stage renal disease: Secondary | ICD-10-CM | POA: Diagnosis not present

## 2017-02-04 DIAGNOSIS — Z79899 Other long term (current) drug therapy: Secondary | ICD-10-CM | POA: Diagnosis not present

## 2017-02-04 DIAGNOSIS — N2589 Other disorders resulting from impaired renal tubular function: Secondary | ICD-10-CM | POA: Diagnosis not present

## 2017-02-04 DIAGNOSIS — K769 Liver disease, unspecified: Secondary | ICD-10-CM | POA: Diagnosis not present

## 2017-02-04 DIAGNOSIS — D631 Anemia in chronic kidney disease: Secondary | ICD-10-CM | POA: Diagnosis not present

## 2017-02-04 DIAGNOSIS — N2581 Secondary hyperparathyroidism of renal origin: Secondary | ICD-10-CM | POA: Diagnosis not present

## 2017-02-05 DIAGNOSIS — M50322 Other cervical disc degeneration at C5-C6 level: Secondary | ICD-10-CM | POA: Diagnosis not present

## 2017-02-05 DIAGNOSIS — Z7682 Awaiting organ transplant status: Secondary | ICD-10-CM | POA: Diagnosis not present

## 2017-02-05 DIAGNOSIS — M9901 Segmental and somatic dysfunction of cervical region: Secondary | ICD-10-CM | POA: Diagnosis not present

## 2017-02-05 DIAGNOSIS — K769 Liver disease, unspecified: Secondary | ICD-10-CM | POA: Diagnosis not present

## 2017-02-05 DIAGNOSIS — D631 Anemia in chronic kidney disease: Secondary | ICD-10-CM | POA: Diagnosis not present

## 2017-02-05 DIAGNOSIS — N2581 Secondary hyperparathyroidism of renal origin: Secondary | ICD-10-CM | POA: Diagnosis not present

## 2017-02-05 DIAGNOSIS — N186 End stage renal disease: Secondary | ICD-10-CM | POA: Diagnosis not present

## 2017-02-05 DIAGNOSIS — N2589 Other disorders resulting from impaired renal tubular function: Secondary | ICD-10-CM | POA: Diagnosis not present

## 2017-02-05 DIAGNOSIS — Z79899 Other long term (current) drug therapy: Secondary | ICD-10-CM | POA: Diagnosis not present

## 2017-02-05 DIAGNOSIS — M9902 Segmental and somatic dysfunction of thoracic region: Secondary | ICD-10-CM | POA: Diagnosis not present

## 2017-02-05 DIAGNOSIS — M5134 Other intervertebral disc degeneration, thoracic region: Secondary | ICD-10-CM | POA: Diagnosis not present

## 2017-02-06 DIAGNOSIS — Z79899 Other long term (current) drug therapy: Secondary | ICD-10-CM | POA: Diagnosis not present

## 2017-02-06 DIAGNOSIS — N2589 Other disorders resulting from impaired renal tubular function: Secondary | ICD-10-CM | POA: Diagnosis not present

## 2017-02-06 DIAGNOSIS — N186 End stage renal disease: Secondary | ICD-10-CM | POA: Diagnosis not present

## 2017-02-06 DIAGNOSIS — K769 Liver disease, unspecified: Secondary | ICD-10-CM | POA: Diagnosis not present

## 2017-02-06 DIAGNOSIS — D631 Anemia in chronic kidney disease: Secondary | ICD-10-CM | POA: Diagnosis not present

## 2017-02-06 DIAGNOSIS — N2581 Secondary hyperparathyroidism of renal origin: Secondary | ICD-10-CM | POA: Diagnosis not present

## 2017-02-07 DIAGNOSIS — M9902 Segmental and somatic dysfunction of thoracic region: Secondary | ICD-10-CM | POA: Diagnosis not present

## 2017-02-07 DIAGNOSIS — Z79899 Other long term (current) drug therapy: Secondary | ICD-10-CM | POA: Diagnosis not present

## 2017-02-07 DIAGNOSIS — N186 End stage renal disease: Secondary | ICD-10-CM | POA: Diagnosis not present

## 2017-02-07 DIAGNOSIS — D631 Anemia in chronic kidney disease: Secondary | ICD-10-CM | POA: Diagnosis not present

## 2017-02-07 DIAGNOSIS — N2589 Other disorders resulting from impaired renal tubular function: Secondary | ICD-10-CM | POA: Diagnosis not present

## 2017-02-07 DIAGNOSIS — K769 Liver disease, unspecified: Secondary | ICD-10-CM | POA: Diagnosis not present

## 2017-02-07 DIAGNOSIS — M9901 Segmental and somatic dysfunction of cervical region: Secondary | ICD-10-CM | POA: Diagnosis not present

## 2017-02-07 DIAGNOSIS — M50322 Other cervical disc degeneration at C5-C6 level: Secondary | ICD-10-CM | POA: Diagnosis not present

## 2017-02-07 DIAGNOSIS — N2581 Secondary hyperparathyroidism of renal origin: Secondary | ICD-10-CM | POA: Diagnosis not present

## 2017-02-07 DIAGNOSIS — M5134 Other intervertebral disc degeneration, thoracic region: Secondary | ICD-10-CM | POA: Diagnosis not present

## 2017-02-08 DIAGNOSIS — K769 Liver disease, unspecified: Secondary | ICD-10-CM | POA: Diagnosis not present

## 2017-02-08 DIAGNOSIS — N186 End stage renal disease: Secondary | ICD-10-CM | POA: Diagnosis not present

## 2017-02-08 DIAGNOSIS — D631 Anemia in chronic kidney disease: Secondary | ICD-10-CM | POA: Diagnosis not present

## 2017-02-08 DIAGNOSIS — N2589 Other disorders resulting from impaired renal tubular function: Secondary | ICD-10-CM | POA: Diagnosis not present

## 2017-02-08 DIAGNOSIS — Z79899 Other long term (current) drug therapy: Secondary | ICD-10-CM | POA: Diagnosis not present

## 2017-02-08 DIAGNOSIS — N2581 Secondary hyperparathyroidism of renal origin: Secondary | ICD-10-CM | POA: Diagnosis not present

## 2017-02-09 DIAGNOSIS — N186 End stage renal disease: Secondary | ICD-10-CM | POA: Diagnosis not present

## 2017-02-09 DIAGNOSIS — N2589 Other disorders resulting from impaired renal tubular function: Secondary | ICD-10-CM | POA: Diagnosis not present

## 2017-02-09 DIAGNOSIS — D631 Anemia in chronic kidney disease: Secondary | ICD-10-CM | POA: Diagnosis not present

## 2017-02-09 DIAGNOSIS — Z79899 Other long term (current) drug therapy: Secondary | ICD-10-CM | POA: Diagnosis not present

## 2017-02-09 DIAGNOSIS — K769 Liver disease, unspecified: Secondary | ICD-10-CM | POA: Diagnosis not present

## 2017-02-09 DIAGNOSIS — N2581 Secondary hyperparathyroidism of renal origin: Secondary | ICD-10-CM | POA: Diagnosis not present

## 2017-02-10 DIAGNOSIS — N2581 Secondary hyperparathyroidism of renal origin: Secondary | ICD-10-CM | POA: Diagnosis not present

## 2017-02-10 DIAGNOSIS — D631 Anemia in chronic kidney disease: Secondary | ICD-10-CM | POA: Diagnosis not present

## 2017-02-10 DIAGNOSIS — K769 Liver disease, unspecified: Secondary | ICD-10-CM | POA: Diagnosis not present

## 2017-02-10 DIAGNOSIS — M50322 Other cervical disc degeneration at C5-C6 level: Secondary | ICD-10-CM | POA: Diagnosis not present

## 2017-02-10 DIAGNOSIS — M9901 Segmental and somatic dysfunction of cervical region: Secondary | ICD-10-CM | POA: Diagnosis not present

## 2017-02-10 DIAGNOSIS — M5134 Other intervertebral disc degeneration, thoracic region: Secondary | ICD-10-CM | POA: Diagnosis not present

## 2017-02-10 DIAGNOSIS — Z79899 Other long term (current) drug therapy: Secondary | ICD-10-CM | POA: Diagnosis not present

## 2017-02-10 DIAGNOSIS — N2589 Other disorders resulting from impaired renal tubular function: Secondary | ICD-10-CM | POA: Diagnosis not present

## 2017-02-10 DIAGNOSIS — M9902 Segmental and somatic dysfunction of thoracic region: Secondary | ICD-10-CM | POA: Diagnosis not present

## 2017-02-10 DIAGNOSIS — N186 End stage renal disease: Secondary | ICD-10-CM | POA: Diagnosis not present

## 2017-02-11 DIAGNOSIS — N2589 Other disorders resulting from impaired renal tubular function: Secondary | ICD-10-CM | POA: Diagnosis not present

## 2017-02-11 DIAGNOSIS — K769 Liver disease, unspecified: Secondary | ICD-10-CM | POA: Diagnosis not present

## 2017-02-11 DIAGNOSIS — N2581 Secondary hyperparathyroidism of renal origin: Secondary | ICD-10-CM | POA: Diagnosis not present

## 2017-02-11 DIAGNOSIS — N186 End stage renal disease: Secondary | ICD-10-CM | POA: Diagnosis not present

## 2017-02-11 DIAGNOSIS — D631 Anemia in chronic kidney disease: Secondary | ICD-10-CM | POA: Diagnosis not present

## 2017-02-11 DIAGNOSIS — Z79899 Other long term (current) drug therapy: Secondary | ICD-10-CM | POA: Diagnosis not present

## 2017-02-12 DIAGNOSIS — M50322 Other cervical disc degeneration at C5-C6 level: Secondary | ICD-10-CM | POA: Diagnosis not present

## 2017-02-12 DIAGNOSIS — M9901 Segmental and somatic dysfunction of cervical region: Secondary | ICD-10-CM | POA: Diagnosis not present

## 2017-02-12 DIAGNOSIS — N2589 Other disorders resulting from impaired renal tubular function: Secondary | ICD-10-CM | POA: Diagnosis not present

## 2017-02-12 DIAGNOSIS — N186 End stage renal disease: Secondary | ICD-10-CM | POA: Diagnosis not present

## 2017-02-12 DIAGNOSIS — D631 Anemia in chronic kidney disease: Secondary | ICD-10-CM | POA: Diagnosis not present

## 2017-02-12 DIAGNOSIS — M5134 Other intervertebral disc degeneration, thoracic region: Secondary | ICD-10-CM | POA: Diagnosis not present

## 2017-02-12 DIAGNOSIS — N2581 Secondary hyperparathyroidism of renal origin: Secondary | ICD-10-CM | POA: Diagnosis not present

## 2017-02-12 DIAGNOSIS — M9902 Segmental and somatic dysfunction of thoracic region: Secondary | ICD-10-CM | POA: Diagnosis not present

## 2017-02-12 DIAGNOSIS — K769 Liver disease, unspecified: Secondary | ICD-10-CM | POA: Diagnosis not present

## 2017-02-12 DIAGNOSIS — Z79899 Other long term (current) drug therapy: Secondary | ICD-10-CM | POA: Diagnosis not present

## 2017-02-13 DIAGNOSIS — N2589 Other disorders resulting from impaired renal tubular function: Secondary | ICD-10-CM | POA: Diagnosis not present

## 2017-02-13 DIAGNOSIS — K769 Liver disease, unspecified: Secondary | ICD-10-CM | POA: Diagnosis not present

## 2017-02-13 DIAGNOSIS — F4320 Adjustment disorder, unspecified: Secondary | ICD-10-CM | POA: Diagnosis not present

## 2017-02-13 DIAGNOSIS — Z79899 Other long term (current) drug therapy: Secondary | ICD-10-CM | POA: Diagnosis not present

## 2017-02-13 DIAGNOSIS — N2581 Secondary hyperparathyroidism of renal origin: Secondary | ICD-10-CM | POA: Diagnosis not present

## 2017-02-13 DIAGNOSIS — N186 End stage renal disease: Secondary | ICD-10-CM | POA: Diagnosis not present

## 2017-02-13 DIAGNOSIS — D631 Anemia in chronic kidney disease: Secondary | ICD-10-CM | POA: Diagnosis not present

## 2017-02-14 DIAGNOSIS — N2589 Other disorders resulting from impaired renal tubular function: Secondary | ICD-10-CM | POA: Diagnosis not present

## 2017-02-14 DIAGNOSIS — K769 Liver disease, unspecified: Secondary | ICD-10-CM | POA: Diagnosis not present

## 2017-02-14 DIAGNOSIS — D631 Anemia in chronic kidney disease: Secondary | ICD-10-CM | POA: Diagnosis not present

## 2017-02-14 DIAGNOSIS — N186 End stage renal disease: Secondary | ICD-10-CM | POA: Diagnosis not present

## 2017-02-14 DIAGNOSIS — Z79899 Other long term (current) drug therapy: Secondary | ICD-10-CM | POA: Diagnosis not present

## 2017-02-14 DIAGNOSIS — N2581 Secondary hyperparathyroidism of renal origin: Secondary | ICD-10-CM | POA: Diagnosis not present

## 2017-02-15 DIAGNOSIS — D631 Anemia in chronic kidney disease: Secondary | ICD-10-CM | POA: Diagnosis not present

## 2017-02-15 DIAGNOSIS — N186 End stage renal disease: Secondary | ICD-10-CM | POA: Diagnosis not present

## 2017-02-15 DIAGNOSIS — K769 Liver disease, unspecified: Secondary | ICD-10-CM | POA: Diagnosis not present

## 2017-02-15 DIAGNOSIS — N2589 Other disorders resulting from impaired renal tubular function: Secondary | ICD-10-CM | POA: Diagnosis not present

## 2017-02-15 DIAGNOSIS — N2581 Secondary hyperparathyroidism of renal origin: Secondary | ICD-10-CM | POA: Diagnosis not present

## 2017-02-15 DIAGNOSIS — Z79899 Other long term (current) drug therapy: Secondary | ICD-10-CM | POA: Diagnosis not present

## 2017-02-16 DIAGNOSIS — N186 End stage renal disease: Secondary | ICD-10-CM | POA: Diagnosis not present

## 2017-02-16 DIAGNOSIS — N2589 Other disorders resulting from impaired renal tubular function: Secondary | ICD-10-CM | POA: Diagnosis not present

## 2017-02-16 DIAGNOSIS — N2581 Secondary hyperparathyroidism of renal origin: Secondary | ICD-10-CM | POA: Diagnosis not present

## 2017-02-16 DIAGNOSIS — K769 Liver disease, unspecified: Secondary | ICD-10-CM | POA: Diagnosis not present

## 2017-02-16 DIAGNOSIS — Z79899 Other long term (current) drug therapy: Secondary | ICD-10-CM | POA: Diagnosis not present

## 2017-02-16 DIAGNOSIS — D631 Anemia in chronic kidney disease: Secondary | ICD-10-CM | POA: Diagnosis not present

## 2017-02-17 DIAGNOSIS — D631 Anemia in chronic kidney disease: Secondary | ICD-10-CM | POA: Diagnosis not present

## 2017-02-17 DIAGNOSIS — N186 End stage renal disease: Secondary | ICD-10-CM | POA: Diagnosis not present

## 2017-02-17 DIAGNOSIS — N2581 Secondary hyperparathyroidism of renal origin: Secondary | ICD-10-CM | POA: Diagnosis not present

## 2017-02-17 DIAGNOSIS — N2589 Other disorders resulting from impaired renal tubular function: Secondary | ICD-10-CM | POA: Diagnosis not present

## 2017-02-17 DIAGNOSIS — Z79899 Other long term (current) drug therapy: Secondary | ICD-10-CM | POA: Diagnosis not present

## 2017-02-17 DIAGNOSIS — K769 Liver disease, unspecified: Secondary | ICD-10-CM | POA: Diagnosis not present

## 2017-02-18 DIAGNOSIS — K769 Liver disease, unspecified: Secondary | ICD-10-CM | POA: Diagnosis not present

## 2017-02-18 DIAGNOSIS — N2589 Other disorders resulting from impaired renal tubular function: Secondary | ICD-10-CM | POA: Diagnosis not present

## 2017-02-18 DIAGNOSIS — N2581 Secondary hyperparathyroidism of renal origin: Secondary | ICD-10-CM | POA: Diagnosis not present

## 2017-02-18 DIAGNOSIS — Z79899 Other long term (current) drug therapy: Secondary | ICD-10-CM | POA: Diagnosis not present

## 2017-02-18 DIAGNOSIS — N186 End stage renal disease: Secondary | ICD-10-CM | POA: Diagnosis not present

## 2017-02-18 DIAGNOSIS — D631 Anemia in chronic kidney disease: Secondary | ICD-10-CM | POA: Diagnosis not present

## 2017-02-19 DIAGNOSIS — N186 End stage renal disease: Secondary | ICD-10-CM | POA: Diagnosis not present

## 2017-02-19 DIAGNOSIS — N2589 Other disorders resulting from impaired renal tubular function: Secondary | ICD-10-CM | POA: Diagnosis not present

## 2017-02-19 DIAGNOSIS — D631 Anemia in chronic kidney disease: Secondary | ICD-10-CM | POA: Diagnosis not present

## 2017-02-19 DIAGNOSIS — Z79899 Other long term (current) drug therapy: Secondary | ICD-10-CM | POA: Diagnosis not present

## 2017-02-19 DIAGNOSIS — N2581 Secondary hyperparathyroidism of renal origin: Secondary | ICD-10-CM | POA: Diagnosis not present

## 2017-02-19 DIAGNOSIS — K769 Liver disease, unspecified: Secondary | ICD-10-CM | POA: Diagnosis not present

## 2017-02-20 DIAGNOSIS — N2589 Other disorders resulting from impaired renal tubular function: Secondary | ICD-10-CM | POA: Diagnosis not present

## 2017-02-20 DIAGNOSIS — Z79899 Other long term (current) drug therapy: Secondary | ICD-10-CM | POA: Diagnosis not present

## 2017-02-20 DIAGNOSIS — Z992 Dependence on renal dialysis: Secondary | ICD-10-CM | POA: Diagnosis not present

## 2017-02-20 DIAGNOSIS — K769 Liver disease, unspecified: Secondary | ICD-10-CM | POA: Diagnosis not present

## 2017-02-20 DIAGNOSIS — N186 End stage renal disease: Secondary | ICD-10-CM | POA: Diagnosis not present

## 2017-02-20 DIAGNOSIS — N2581 Secondary hyperparathyroidism of renal origin: Secondary | ICD-10-CM | POA: Diagnosis not present

## 2017-02-20 DIAGNOSIS — D631 Anemia in chronic kidney disease: Secondary | ICD-10-CM | POA: Diagnosis not present

## 2017-02-20 DIAGNOSIS — T8612 Kidney transplant failure: Secondary | ICD-10-CM | POA: Diagnosis not present

## 2017-02-21 DIAGNOSIS — D631 Anemia in chronic kidney disease: Secondary | ICD-10-CM | POA: Diagnosis not present

## 2017-02-21 DIAGNOSIS — D509 Iron deficiency anemia, unspecified: Secondary | ICD-10-CM | POA: Diagnosis not present

## 2017-02-21 DIAGNOSIS — N186 End stage renal disease: Secondary | ICD-10-CM | POA: Diagnosis not present

## 2017-02-21 DIAGNOSIS — N2581 Secondary hyperparathyroidism of renal origin: Secondary | ICD-10-CM | POA: Diagnosis not present

## 2017-02-21 DIAGNOSIS — Z79899 Other long term (current) drug therapy: Secondary | ICD-10-CM | POA: Diagnosis not present

## 2017-02-21 DIAGNOSIS — E46 Unspecified protein-calorie malnutrition: Secondary | ICD-10-CM | POA: Diagnosis not present

## 2017-02-21 DIAGNOSIS — K769 Liver disease, unspecified: Secondary | ICD-10-CM | POA: Diagnosis not present

## 2017-02-21 DIAGNOSIS — N2589 Other disorders resulting from impaired renal tubular function: Secondary | ICD-10-CM | POA: Diagnosis not present

## 2017-02-22 DIAGNOSIS — N186 End stage renal disease: Secondary | ICD-10-CM | POA: Diagnosis not present

## 2017-02-22 DIAGNOSIS — Z79899 Other long term (current) drug therapy: Secondary | ICD-10-CM | POA: Diagnosis not present

## 2017-02-22 DIAGNOSIS — N2581 Secondary hyperparathyroidism of renal origin: Secondary | ICD-10-CM | POA: Diagnosis not present

## 2017-02-22 DIAGNOSIS — N2589 Other disorders resulting from impaired renal tubular function: Secondary | ICD-10-CM | POA: Diagnosis not present

## 2017-02-22 DIAGNOSIS — D631 Anemia in chronic kidney disease: Secondary | ICD-10-CM | POA: Diagnosis not present

## 2017-02-22 DIAGNOSIS — K769 Liver disease, unspecified: Secondary | ICD-10-CM | POA: Diagnosis not present

## 2017-02-23 DIAGNOSIS — D631 Anemia in chronic kidney disease: Secondary | ICD-10-CM | POA: Diagnosis not present

## 2017-02-23 DIAGNOSIS — N186 End stage renal disease: Secondary | ICD-10-CM | POA: Diagnosis not present

## 2017-02-23 DIAGNOSIS — N2581 Secondary hyperparathyroidism of renal origin: Secondary | ICD-10-CM | POA: Diagnosis not present

## 2017-02-23 DIAGNOSIS — K769 Liver disease, unspecified: Secondary | ICD-10-CM | POA: Diagnosis not present

## 2017-02-23 DIAGNOSIS — Z79899 Other long term (current) drug therapy: Secondary | ICD-10-CM | POA: Diagnosis not present

## 2017-02-23 DIAGNOSIS — N2589 Other disorders resulting from impaired renal tubular function: Secondary | ICD-10-CM | POA: Diagnosis not present

## 2017-02-24 DIAGNOSIS — M9901 Segmental and somatic dysfunction of cervical region: Secondary | ICD-10-CM | POA: Diagnosis not present

## 2017-02-24 DIAGNOSIS — Z79899 Other long term (current) drug therapy: Secondary | ICD-10-CM | POA: Diagnosis not present

## 2017-02-24 DIAGNOSIS — N186 End stage renal disease: Secondary | ICD-10-CM | POA: Diagnosis not present

## 2017-02-24 DIAGNOSIS — M50322 Other cervical disc degeneration at C5-C6 level: Secondary | ICD-10-CM | POA: Diagnosis not present

## 2017-02-24 DIAGNOSIS — M5134 Other intervertebral disc degeneration, thoracic region: Secondary | ICD-10-CM | POA: Diagnosis not present

## 2017-02-24 DIAGNOSIS — K769 Liver disease, unspecified: Secondary | ICD-10-CM | POA: Diagnosis not present

## 2017-02-24 DIAGNOSIS — N2581 Secondary hyperparathyroidism of renal origin: Secondary | ICD-10-CM | POA: Diagnosis not present

## 2017-02-24 DIAGNOSIS — D631 Anemia in chronic kidney disease: Secondary | ICD-10-CM | POA: Diagnosis not present

## 2017-02-24 DIAGNOSIS — M9902 Segmental and somatic dysfunction of thoracic region: Secondary | ICD-10-CM | POA: Diagnosis not present

## 2017-02-24 DIAGNOSIS — N2589 Other disorders resulting from impaired renal tubular function: Secondary | ICD-10-CM | POA: Diagnosis not present

## 2017-02-25 DIAGNOSIS — M5134 Other intervertebral disc degeneration, thoracic region: Secondary | ICD-10-CM | POA: Diagnosis not present

## 2017-02-25 DIAGNOSIS — N186 End stage renal disease: Secondary | ICD-10-CM | POA: Diagnosis not present

## 2017-02-25 DIAGNOSIS — M9901 Segmental and somatic dysfunction of cervical region: Secondary | ICD-10-CM | POA: Diagnosis not present

## 2017-02-25 DIAGNOSIS — M50322 Other cervical disc degeneration at C5-C6 level: Secondary | ICD-10-CM | POA: Diagnosis not present

## 2017-02-25 DIAGNOSIS — Z79899 Other long term (current) drug therapy: Secondary | ICD-10-CM | POA: Diagnosis not present

## 2017-02-25 DIAGNOSIS — D631 Anemia in chronic kidney disease: Secondary | ICD-10-CM | POA: Diagnosis not present

## 2017-02-25 DIAGNOSIS — N2581 Secondary hyperparathyroidism of renal origin: Secondary | ICD-10-CM | POA: Diagnosis not present

## 2017-02-25 DIAGNOSIS — K769 Liver disease, unspecified: Secondary | ICD-10-CM | POA: Diagnosis not present

## 2017-02-25 DIAGNOSIS — M9902 Segmental and somatic dysfunction of thoracic region: Secondary | ICD-10-CM | POA: Diagnosis not present

## 2017-02-25 DIAGNOSIS — N2589 Other disorders resulting from impaired renal tubular function: Secondary | ICD-10-CM | POA: Diagnosis not present

## 2017-02-25 DIAGNOSIS — R8299 Other abnormal findings in urine: Secondary | ICD-10-CM | POA: Diagnosis not present

## 2017-02-26 DIAGNOSIS — N186 End stage renal disease: Secondary | ICD-10-CM | POA: Diagnosis not present

## 2017-02-26 DIAGNOSIS — D631 Anemia in chronic kidney disease: Secondary | ICD-10-CM | POA: Diagnosis not present

## 2017-02-26 DIAGNOSIS — N2581 Secondary hyperparathyroidism of renal origin: Secondary | ICD-10-CM | POA: Diagnosis not present

## 2017-02-26 DIAGNOSIS — N2589 Other disorders resulting from impaired renal tubular function: Secondary | ICD-10-CM | POA: Diagnosis not present

## 2017-02-26 DIAGNOSIS — Z79899 Other long term (current) drug therapy: Secondary | ICD-10-CM | POA: Diagnosis not present

## 2017-02-26 DIAGNOSIS — K769 Liver disease, unspecified: Secondary | ICD-10-CM | POA: Diagnosis not present

## 2017-02-27 DIAGNOSIS — K769 Liver disease, unspecified: Secondary | ICD-10-CM | POA: Diagnosis not present

## 2017-02-27 DIAGNOSIS — M9902 Segmental and somatic dysfunction of thoracic region: Secondary | ICD-10-CM | POA: Diagnosis not present

## 2017-02-27 DIAGNOSIS — M9901 Segmental and somatic dysfunction of cervical region: Secondary | ICD-10-CM | POA: Diagnosis not present

## 2017-02-27 DIAGNOSIS — N2581 Secondary hyperparathyroidism of renal origin: Secondary | ICD-10-CM | POA: Diagnosis not present

## 2017-02-27 DIAGNOSIS — M50322 Other cervical disc degeneration at C5-C6 level: Secondary | ICD-10-CM | POA: Diagnosis not present

## 2017-02-27 DIAGNOSIS — N186 End stage renal disease: Secondary | ICD-10-CM | POA: Diagnosis not present

## 2017-02-27 DIAGNOSIS — M5134 Other intervertebral disc degeneration, thoracic region: Secondary | ICD-10-CM | POA: Diagnosis not present

## 2017-02-27 DIAGNOSIS — F4320 Adjustment disorder, unspecified: Secondary | ICD-10-CM | POA: Diagnosis not present

## 2017-02-27 DIAGNOSIS — Z79899 Other long term (current) drug therapy: Secondary | ICD-10-CM | POA: Diagnosis not present

## 2017-02-27 DIAGNOSIS — D631 Anemia in chronic kidney disease: Secondary | ICD-10-CM | POA: Diagnosis not present

## 2017-02-27 DIAGNOSIS — N2589 Other disorders resulting from impaired renal tubular function: Secondary | ICD-10-CM | POA: Diagnosis not present

## 2017-02-28 DIAGNOSIS — Z79899 Other long term (current) drug therapy: Secondary | ICD-10-CM | POA: Diagnosis not present

## 2017-02-28 DIAGNOSIS — D631 Anemia in chronic kidney disease: Secondary | ICD-10-CM | POA: Diagnosis not present

## 2017-02-28 DIAGNOSIS — R05 Cough: Secondary | ICD-10-CM | POA: Diagnosis not present

## 2017-02-28 DIAGNOSIS — N2589 Other disorders resulting from impaired renal tubular function: Secondary | ICD-10-CM | POA: Diagnosis not present

## 2017-02-28 DIAGNOSIS — R079 Chest pain, unspecified: Secondary | ICD-10-CM | POA: Diagnosis not present

## 2017-02-28 DIAGNOSIS — N186 End stage renal disease: Secondary | ICD-10-CM | POA: Diagnosis not present

## 2017-02-28 DIAGNOSIS — N2581 Secondary hyperparathyroidism of renal origin: Secondary | ICD-10-CM | POA: Diagnosis not present

## 2017-02-28 DIAGNOSIS — K769 Liver disease, unspecified: Secondary | ICD-10-CM | POA: Diagnosis not present

## 2017-03-01 DIAGNOSIS — N2581 Secondary hyperparathyroidism of renal origin: Secondary | ICD-10-CM | POA: Diagnosis not present

## 2017-03-01 DIAGNOSIS — Z79899 Other long term (current) drug therapy: Secondary | ICD-10-CM | POA: Diagnosis not present

## 2017-03-01 DIAGNOSIS — K769 Liver disease, unspecified: Secondary | ICD-10-CM | POA: Diagnosis not present

## 2017-03-01 DIAGNOSIS — N2589 Other disorders resulting from impaired renal tubular function: Secondary | ICD-10-CM | POA: Diagnosis not present

## 2017-03-01 DIAGNOSIS — N186 End stage renal disease: Secondary | ICD-10-CM | POA: Diagnosis not present

## 2017-03-01 DIAGNOSIS — D631 Anemia in chronic kidney disease: Secondary | ICD-10-CM | POA: Diagnosis not present

## 2017-03-02 DIAGNOSIS — N2581 Secondary hyperparathyroidism of renal origin: Secondary | ICD-10-CM | POA: Diagnosis not present

## 2017-03-02 DIAGNOSIS — K769 Liver disease, unspecified: Secondary | ICD-10-CM | POA: Diagnosis not present

## 2017-03-02 DIAGNOSIS — D631 Anemia in chronic kidney disease: Secondary | ICD-10-CM | POA: Diagnosis not present

## 2017-03-02 DIAGNOSIS — N186 End stage renal disease: Secondary | ICD-10-CM | POA: Diagnosis not present

## 2017-03-02 DIAGNOSIS — Z79899 Other long term (current) drug therapy: Secondary | ICD-10-CM | POA: Diagnosis not present

## 2017-03-02 DIAGNOSIS — N2589 Other disorders resulting from impaired renal tubular function: Secondary | ICD-10-CM | POA: Diagnosis not present

## 2017-03-03 DIAGNOSIS — K769 Liver disease, unspecified: Secondary | ICD-10-CM | POA: Diagnosis not present

## 2017-03-03 DIAGNOSIS — M50322 Other cervical disc degeneration at C5-C6 level: Secondary | ICD-10-CM | POA: Diagnosis not present

## 2017-03-03 DIAGNOSIS — M9902 Segmental and somatic dysfunction of thoracic region: Secondary | ICD-10-CM | POA: Diagnosis not present

## 2017-03-03 DIAGNOSIS — N2589 Other disorders resulting from impaired renal tubular function: Secondary | ICD-10-CM | POA: Diagnosis not present

## 2017-03-03 DIAGNOSIS — Z79899 Other long term (current) drug therapy: Secondary | ICD-10-CM | POA: Diagnosis not present

## 2017-03-03 DIAGNOSIS — N186 End stage renal disease: Secondary | ICD-10-CM | POA: Diagnosis not present

## 2017-03-03 DIAGNOSIS — M5134 Other intervertebral disc degeneration, thoracic region: Secondary | ICD-10-CM | POA: Diagnosis not present

## 2017-03-03 DIAGNOSIS — M9901 Segmental and somatic dysfunction of cervical region: Secondary | ICD-10-CM | POA: Diagnosis not present

## 2017-03-03 DIAGNOSIS — D631 Anemia in chronic kidney disease: Secondary | ICD-10-CM | POA: Diagnosis not present

## 2017-03-03 DIAGNOSIS — N2581 Secondary hyperparathyroidism of renal origin: Secondary | ICD-10-CM | POA: Diagnosis not present

## 2017-03-04 DIAGNOSIS — N2589 Other disorders resulting from impaired renal tubular function: Secondary | ICD-10-CM | POA: Diagnosis not present

## 2017-03-04 DIAGNOSIS — N2581 Secondary hyperparathyroidism of renal origin: Secondary | ICD-10-CM | POA: Diagnosis not present

## 2017-03-04 DIAGNOSIS — D631 Anemia in chronic kidney disease: Secondary | ICD-10-CM | POA: Diagnosis not present

## 2017-03-04 DIAGNOSIS — N186 End stage renal disease: Secondary | ICD-10-CM | POA: Diagnosis not present

## 2017-03-04 DIAGNOSIS — K769 Liver disease, unspecified: Secondary | ICD-10-CM | POA: Diagnosis not present

## 2017-03-04 DIAGNOSIS — Z79899 Other long term (current) drug therapy: Secondary | ICD-10-CM | POA: Diagnosis not present

## 2017-03-05 DIAGNOSIS — N186 End stage renal disease: Secondary | ICD-10-CM | POA: Diagnosis not present

## 2017-03-05 DIAGNOSIS — N2589 Other disorders resulting from impaired renal tubular function: Secondary | ICD-10-CM | POA: Diagnosis not present

## 2017-03-05 DIAGNOSIS — D631 Anemia in chronic kidney disease: Secondary | ICD-10-CM | POA: Diagnosis not present

## 2017-03-05 DIAGNOSIS — Z79899 Other long term (current) drug therapy: Secondary | ICD-10-CM | POA: Diagnosis not present

## 2017-03-05 DIAGNOSIS — N2581 Secondary hyperparathyroidism of renal origin: Secondary | ICD-10-CM | POA: Diagnosis not present

## 2017-03-05 DIAGNOSIS — K769 Liver disease, unspecified: Secondary | ICD-10-CM | POA: Diagnosis not present

## 2017-03-06 DIAGNOSIS — M50322 Other cervical disc degeneration at C5-C6 level: Secondary | ICD-10-CM | POA: Diagnosis not present

## 2017-03-06 DIAGNOSIS — N186 End stage renal disease: Secondary | ICD-10-CM | POA: Diagnosis not present

## 2017-03-06 DIAGNOSIS — M5134 Other intervertebral disc degeneration, thoracic region: Secondary | ICD-10-CM | POA: Diagnosis not present

## 2017-03-06 DIAGNOSIS — K769 Liver disease, unspecified: Secondary | ICD-10-CM | POA: Diagnosis not present

## 2017-03-06 DIAGNOSIS — D631 Anemia in chronic kidney disease: Secondary | ICD-10-CM | POA: Diagnosis not present

## 2017-03-06 DIAGNOSIS — Z79899 Other long term (current) drug therapy: Secondary | ICD-10-CM | POA: Diagnosis not present

## 2017-03-06 DIAGNOSIS — M9902 Segmental and somatic dysfunction of thoracic region: Secondary | ICD-10-CM | POA: Diagnosis not present

## 2017-03-06 DIAGNOSIS — N2589 Other disorders resulting from impaired renal tubular function: Secondary | ICD-10-CM | POA: Diagnosis not present

## 2017-03-06 DIAGNOSIS — M9901 Segmental and somatic dysfunction of cervical region: Secondary | ICD-10-CM | POA: Diagnosis not present

## 2017-03-06 DIAGNOSIS — N2581 Secondary hyperparathyroidism of renal origin: Secondary | ICD-10-CM | POA: Diagnosis not present

## 2017-03-07 DIAGNOSIS — N186 End stage renal disease: Secondary | ICD-10-CM | POA: Diagnosis not present

## 2017-03-07 DIAGNOSIS — N2589 Other disorders resulting from impaired renal tubular function: Secondary | ICD-10-CM | POA: Diagnosis not present

## 2017-03-07 DIAGNOSIS — D631 Anemia in chronic kidney disease: Secondary | ICD-10-CM | POA: Diagnosis not present

## 2017-03-07 DIAGNOSIS — N2581 Secondary hyperparathyroidism of renal origin: Secondary | ICD-10-CM | POA: Diagnosis not present

## 2017-03-07 DIAGNOSIS — K769 Liver disease, unspecified: Secondary | ICD-10-CM | POA: Diagnosis not present

## 2017-03-07 DIAGNOSIS — Z79899 Other long term (current) drug therapy: Secondary | ICD-10-CM | POA: Diagnosis not present

## 2017-03-08 DIAGNOSIS — Z79899 Other long term (current) drug therapy: Secondary | ICD-10-CM | POA: Diagnosis not present

## 2017-03-08 DIAGNOSIS — N186 End stage renal disease: Secondary | ICD-10-CM | POA: Diagnosis not present

## 2017-03-08 DIAGNOSIS — D631 Anemia in chronic kidney disease: Secondary | ICD-10-CM | POA: Diagnosis not present

## 2017-03-08 DIAGNOSIS — N2581 Secondary hyperparathyroidism of renal origin: Secondary | ICD-10-CM | POA: Diagnosis not present

## 2017-03-08 DIAGNOSIS — K769 Liver disease, unspecified: Secondary | ICD-10-CM | POA: Diagnosis not present

## 2017-03-08 DIAGNOSIS — N2589 Other disorders resulting from impaired renal tubular function: Secondary | ICD-10-CM | POA: Diagnosis not present

## 2017-03-09 DIAGNOSIS — Z79899 Other long term (current) drug therapy: Secondary | ICD-10-CM | POA: Diagnosis not present

## 2017-03-09 DIAGNOSIS — N2589 Other disorders resulting from impaired renal tubular function: Secondary | ICD-10-CM | POA: Diagnosis not present

## 2017-03-09 DIAGNOSIS — D631 Anemia in chronic kidney disease: Secondary | ICD-10-CM | POA: Diagnosis not present

## 2017-03-09 DIAGNOSIS — K769 Liver disease, unspecified: Secondary | ICD-10-CM | POA: Diagnosis not present

## 2017-03-09 DIAGNOSIS — N2581 Secondary hyperparathyroidism of renal origin: Secondary | ICD-10-CM | POA: Diagnosis not present

## 2017-03-09 DIAGNOSIS — N186 End stage renal disease: Secondary | ICD-10-CM | POA: Diagnosis not present

## 2017-03-10 DIAGNOSIS — K769 Liver disease, unspecified: Secondary | ICD-10-CM | POA: Diagnosis not present

## 2017-03-10 DIAGNOSIS — M5134 Other intervertebral disc degeneration, thoracic region: Secondary | ICD-10-CM | POA: Diagnosis not present

## 2017-03-10 DIAGNOSIS — D631 Anemia in chronic kidney disease: Secondary | ICD-10-CM | POA: Diagnosis not present

## 2017-03-10 DIAGNOSIS — N2581 Secondary hyperparathyroidism of renal origin: Secondary | ICD-10-CM | POA: Diagnosis not present

## 2017-03-10 DIAGNOSIS — N2589 Other disorders resulting from impaired renal tubular function: Secondary | ICD-10-CM | POA: Diagnosis not present

## 2017-03-10 DIAGNOSIS — M9901 Segmental and somatic dysfunction of cervical region: Secondary | ICD-10-CM | POA: Diagnosis not present

## 2017-03-10 DIAGNOSIS — M9902 Segmental and somatic dysfunction of thoracic region: Secondary | ICD-10-CM | POA: Diagnosis not present

## 2017-03-10 DIAGNOSIS — Z79899 Other long term (current) drug therapy: Secondary | ICD-10-CM | POA: Diagnosis not present

## 2017-03-10 DIAGNOSIS — M50322 Other cervical disc degeneration at C5-C6 level: Secondary | ICD-10-CM | POA: Diagnosis not present

## 2017-03-10 DIAGNOSIS — N186 End stage renal disease: Secondary | ICD-10-CM | POA: Diagnosis not present

## 2017-03-11 DIAGNOSIS — Z79899 Other long term (current) drug therapy: Secondary | ICD-10-CM | POA: Diagnosis not present

## 2017-03-11 DIAGNOSIS — N2581 Secondary hyperparathyroidism of renal origin: Secondary | ICD-10-CM | POA: Diagnosis not present

## 2017-03-11 DIAGNOSIS — N2589 Other disorders resulting from impaired renal tubular function: Secondary | ICD-10-CM | POA: Diagnosis not present

## 2017-03-11 DIAGNOSIS — N186 End stage renal disease: Secondary | ICD-10-CM | POA: Diagnosis not present

## 2017-03-11 DIAGNOSIS — D631 Anemia in chronic kidney disease: Secondary | ICD-10-CM | POA: Diagnosis not present

## 2017-03-11 DIAGNOSIS — K769 Liver disease, unspecified: Secondary | ICD-10-CM | POA: Diagnosis not present

## 2017-03-12 DIAGNOSIS — N2589 Other disorders resulting from impaired renal tubular function: Secondary | ICD-10-CM | POA: Diagnosis not present

## 2017-03-12 DIAGNOSIS — Z79899 Other long term (current) drug therapy: Secondary | ICD-10-CM | POA: Diagnosis not present

## 2017-03-12 DIAGNOSIS — Z0000 Encounter for general adult medical examination without abnormal findings: Secondary | ICD-10-CM | POA: Diagnosis not present

## 2017-03-12 DIAGNOSIS — E039 Hypothyroidism, unspecified: Secondary | ICD-10-CM | POA: Diagnosis not present

## 2017-03-12 DIAGNOSIS — K769 Liver disease, unspecified: Secondary | ICD-10-CM | POA: Diagnosis not present

## 2017-03-12 DIAGNOSIS — I671 Cerebral aneurysm, nonruptured: Secondary | ICD-10-CM | POA: Diagnosis not present

## 2017-03-12 DIAGNOSIS — N186 End stage renal disease: Secondary | ICD-10-CM | POA: Diagnosis not present

## 2017-03-12 DIAGNOSIS — N2581 Secondary hyperparathyroidism of renal origin: Secondary | ICD-10-CM | POA: Diagnosis not present

## 2017-03-12 DIAGNOSIS — J45909 Unspecified asthma, uncomplicated: Secondary | ICD-10-CM | POA: Diagnosis not present

## 2017-03-12 DIAGNOSIS — Z8673 Personal history of transient ischemic attack (TIA), and cerebral infarction without residual deficits: Secondary | ICD-10-CM | POA: Diagnosis not present

## 2017-03-12 DIAGNOSIS — Z992 Dependence on renal dialysis: Secondary | ICD-10-CM | POA: Diagnosis not present

## 2017-03-12 DIAGNOSIS — D631 Anemia in chronic kidney disease: Secondary | ICD-10-CM | POA: Diagnosis not present

## 2017-03-13 DIAGNOSIS — D631 Anemia in chronic kidney disease: Secondary | ICD-10-CM | POA: Diagnosis not present

## 2017-03-13 DIAGNOSIS — K769 Liver disease, unspecified: Secondary | ICD-10-CM | POA: Diagnosis not present

## 2017-03-13 DIAGNOSIS — M50322 Other cervical disc degeneration at C5-C6 level: Secondary | ICD-10-CM | POA: Diagnosis not present

## 2017-03-13 DIAGNOSIS — N186 End stage renal disease: Secondary | ICD-10-CM | POA: Diagnosis not present

## 2017-03-13 DIAGNOSIS — N2581 Secondary hyperparathyroidism of renal origin: Secondary | ICD-10-CM | POA: Diagnosis not present

## 2017-03-13 DIAGNOSIS — F4320 Adjustment disorder, unspecified: Secondary | ICD-10-CM | POA: Diagnosis not present

## 2017-03-13 DIAGNOSIS — N2589 Other disorders resulting from impaired renal tubular function: Secondary | ICD-10-CM | POA: Diagnosis not present

## 2017-03-13 DIAGNOSIS — Z79899 Other long term (current) drug therapy: Secondary | ICD-10-CM | POA: Diagnosis not present

## 2017-03-13 DIAGNOSIS — M9902 Segmental and somatic dysfunction of thoracic region: Secondary | ICD-10-CM | POA: Diagnosis not present

## 2017-03-13 DIAGNOSIS — M5134 Other intervertebral disc degeneration, thoracic region: Secondary | ICD-10-CM | POA: Diagnosis not present

## 2017-03-13 DIAGNOSIS — M9901 Segmental and somatic dysfunction of cervical region: Secondary | ICD-10-CM | POA: Diagnosis not present

## 2017-03-14 DIAGNOSIS — D631 Anemia in chronic kidney disease: Secondary | ICD-10-CM | POA: Diagnosis not present

## 2017-03-14 DIAGNOSIS — N2581 Secondary hyperparathyroidism of renal origin: Secondary | ICD-10-CM | POA: Diagnosis not present

## 2017-03-14 DIAGNOSIS — K769 Liver disease, unspecified: Secondary | ICD-10-CM | POA: Diagnosis not present

## 2017-03-14 DIAGNOSIS — Z79899 Other long term (current) drug therapy: Secondary | ICD-10-CM | POA: Diagnosis not present

## 2017-03-14 DIAGNOSIS — N186 End stage renal disease: Secondary | ICD-10-CM | POA: Diagnosis not present

## 2017-03-14 DIAGNOSIS — N2589 Other disorders resulting from impaired renal tubular function: Secondary | ICD-10-CM | POA: Diagnosis not present

## 2017-03-15 DIAGNOSIS — K769 Liver disease, unspecified: Secondary | ICD-10-CM | POA: Diagnosis not present

## 2017-03-15 DIAGNOSIS — D631 Anemia in chronic kidney disease: Secondary | ICD-10-CM | POA: Diagnosis not present

## 2017-03-15 DIAGNOSIS — N2581 Secondary hyperparathyroidism of renal origin: Secondary | ICD-10-CM | POA: Diagnosis not present

## 2017-03-15 DIAGNOSIS — Z79899 Other long term (current) drug therapy: Secondary | ICD-10-CM | POA: Diagnosis not present

## 2017-03-15 DIAGNOSIS — N2589 Other disorders resulting from impaired renal tubular function: Secondary | ICD-10-CM | POA: Diagnosis not present

## 2017-03-15 DIAGNOSIS — N186 End stage renal disease: Secondary | ICD-10-CM | POA: Diagnosis not present

## 2017-03-16 DIAGNOSIS — N2589 Other disorders resulting from impaired renal tubular function: Secondary | ICD-10-CM | POA: Diagnosis not present

## 2017-03-16 DIAGNOSIS — K769 Liver disease, unspecified: Secondary | ICD-10-CM | POA: Diagnosis not present

## 2017-03-16 DIAGNOSIS — Z79899 Other long term (current) drug therapy: Secondary | ICD-10-CM | POA: Diagnosis not present

## 2017-03-16 DIAGNOSIS — N186 End stage renal disease: Secondary | ICD-10-CM | POA: Diagnosis not present

## 2017-03-16 DIAGNOSIS — N2581 Secondary hyperparathyroidism of renal origin: Secondary | ICD-10-CM | POA: Diagnosis not present

## 2017-03-16 DIAGNOSIS — D631 Anemia in chronic kidney disease: Secondary | ICD-10-CM | POA: Diagnosis not present

## 2017-03-17 DIAGNOSIS — M9901 Segmental and somatic dysfunction of cervical region: Secondary | ICD-10-CM | POA: Diagnosis not present

## 2017-03-17 DIAGNOSIS — Z79899 Other long term (current) drug therapy: Secondary | ICD-10-CM | POA: Diagnosis not present

## 2017-03-17 DIAGNOSIS — D631 Anemia in chronic kidney disease: Secondary | ICD-10-CM | POA: Diagnosis not present

## 2017-03-17 DIAGNOSIS — M9902 Segmental and somatic dysfunction of thoracic region: Secondary | ICD-10-CM | POA: Diagnosis not present

## 2017-03-17 DIAGNOSIS — M50322 Other cervical disc degeneration at C5-C6 level: Secondary | ICD-10-CM | POA: Diagnosis not present

## 2017-03-17 DIAGNOSIS — M5134 Other intervertebral disc degeneration, thoracic region: Secondary | ICD-10-CM | POA: Diagnosis not present

## 2017-03-17 DIAGNOSIS — K769 Liver disease, unspecified: Secondary | ICD-10-CM | POA: Diagnosis not present

## 2017-03-17 DIAGNOSIS — N186 End stage renal disease: Secondary | ICD-10-CM | POA: Diagnosis not present

## 2017-03-17 DIAGNOSIS — N2581 Secondary hyperparathyroidism of renal origin: Secondary | ICD-10-CM | POA: Diagnosis not present

## 2017-03-17 DIAGNOSIS — N2589 Other disorders resulting from impaired renal tubular function: Secondary | ICD-10-CM | POA: Diagnosis not present

## 2017-03-18 DIAGNOSIS — N186 End stage renal disease: Secondary | ICD-10-CM | POA: Diagnosis not present

## 2017-03-18 DIAGNOSIS — K769 Liver disease, unspecified: Secondary | ICD-10-CM | POA: Diagnosis not present

## 2017-03-18 DIAGNOSIS — Z79899 Other long term (current) drug therapy: Secondary | ICD-10-CM | POA: Diagnosis not present

## 2017-03-18 DIAGNOSIS — N2589 Other disorders resulting from impaired renal tubular function: Secondary | ICD-10-CM | POA: Diagnosis not present

## 2017-03-18 DIAGNOSIS — N2581 Secondary hyperparathyroidism of renal origin: Secondary | ICD-10-CM | POA: Diagnosis not present

## 2017-03-18 DIAGNOSIS — D631 Anemia in chronic kidney disease: Secondary | ICD-10-CM | POA: Diagnosis not present

## 2017-03-19 DIAGNOSIS — N186 End stage renal disease: Secondary | ICD-10-CM | POA: Diagnosis not present

## 2017-03-19 DIAGNOSIS — K769 Liver disease, unspecified: Secondary | ICD-10-CM | POA: Diagnosis not present

## 2017-03-19 DIAGNOSIS — N2581 Secondary hyperparathyroidism of renal origin: Secondary | ICD-10-CM | POA: Diagnosis not present

## 2017-03-19 DIAGNOSIS — Z79899 Other long term (current) drug therapy: Secondary | ICD-10-CM | POA: Diagnosis not present

## 2017-03-19 DIAGNOSIS — D631 Anemia in chronic kidney disease: Secondary | ICD-10-CM | POA: Diagnosis not present

## 2017-03-19 DIAGNOSIS — N2589 Other disorders resulting from impaired renal tubular function: Secondary | ICD-10-CM | POA: Diagnosis not present

## 2017-03-20 DIAGNOSIS — Z79899 Other long term (current) drug therapy: Secondary | ICD-10-CM | POA: Diagnosis not present

## 2017-03-20 DIAGNOSIS — N2581 Secondary hyperparathyroidism of renal origin: Secondary | ICD-10-CM | POA: Diagnosis not present

## 2017-03-20 DIAGNOSIS — D631 Anemia in chronic kidney disease: Secondary | ICD-10-CM | POA: Diagnosis not present

## 2017-03-20 DIAGNOSIS — N2589 Other disorders resulting from impaired renal tubular function: Secondary | ICD-10-CM | POA: Diagnosis not present

## 2017-03-20 DIAGNOSIS — N186 End stage renal disease: Secondary | ICD-10-CM | POA: Diagnosis not present

## 2017-03-20 DIAGNOSIS — K769 Liver disease, unspecified: Secondary | ICD-10-CM | POA: Diagnosis not present

## 2017-03-21 DIAGNOSIS — Z79899 Other long term (current) drug therapy: Secondary | ICD-10-CM | POA: Diagnosis not present

## 2017-03-21 DIAGNOSIS — N186 End stage renal disease: Secondary | ICD-10-CM | POA: Diagnosis not present

## 2017-03-21 DIAGNOSIS — D631 Anemia in chronic kidney disease: Secondary | ICD-10-CM | POA: Diagnosis not present

## 2017-03-21 DIAGNOSIS — N2589 Other disorders resulting from impaired renal tubular function: Secondary | ICD-10-CM | POA: Diagnosis not present

## 2017-03-21 DIAGNOSIS — N2581 Secondary hyperparathyroidism of renal origin: Secondary | ICD-10-CM | POA: Diagnosis not present

## 2017-03-21 DIAGNOSIS — K769 Liver disease, unspecified: Secondary | ICD-10-CM | POA: Diagnosis not present

## 2017-03-22 DIAGNOSIS — D631 Anemia in chronic kidney disease: Secondary | ICD-10-CM | POA: Diagnosis not present

## 2017-03-22 DIAGNOSIS — N2589 Other disorders resulting from impaired renal tubular function: Secondary | ICD-10-CM | POA: Diagnosis not present

## 2017-03-22 DIAGNOSIS — T8612 Kidney transplant failure: Secondary | ICD-10-CM | POA: Diagnosis not present

## 2017-03-22 DIAGNOSIS — K769 Liver disease, unspecified: Secondary | ICD-10-CM | POA: Diagnosis not present

## 2017-03-22 DIAGNOSIS — N2581 Secondary hyperparathyroidism of renal origin: Secondary | ICD-10-CM | POA: Diagnosis not present

## 2017-03-22 DIAGNOSIS — Z79899 Other long term (current) drug therapy: Secondary | ICD-10-CM | POA: Diagnosis not present

## 2017-03-22 DIAGNOSIS — Z992 Dependence on renal dialysis: Secondary | ICD-10-CM | POA: Diagnosis not present

## 2017-03-22 DIAGNOSIS — N186 End stage renal disease: Secondary | ICD-10-CM | POA: Diagnosis not present

## 2017-03-23 DIAGNOSIS — Z4931 Encounter for adequacy testing for hemodialysis: Secondary | ICD-10-CM | POA: Diagnosis not present

## 2017-03-23 DIAGNOSIS — K769 Liver disease, unspecified: Secondary | ICD-10-CM | POA: Diagnosis not present

## 2017-03-23 DIAGNOSIS — D631 Anemia in chronic kidney disease: Secondary | ICD-10-CM | POA: Diagnosis not present

## 2017-03-23 DIAGNOSIS — N2589 Other disorders resulting from impaired renal tubular function: Secondary | ICD-10-CM | POA: Diagnosis not present

## 2017-03-23 DIAGNOSIS — N2581 Secondary hyperparathyroidism of renal origin: Secondary | ICD-10-CM | POA: Diagnosis not present

## 2017-03-23 DIAGNOSIS — D509 Iron deficiency anemia, unspecified: Secondary | ICD-10-CM | POA: Diagnosis not present

## 2017-03-23 DIAGNOSIS — N186 End stage renal disease: Secondary | ICD-10-CM | POA: Diagnosis not present

## 2017-03-24 DIAGNOSIS — N186 End stage renal disease: Secondary | ICD-10-CM | POA: Diagnosis not present

## 2017-03-24 DIAGNOSIS — Z4931 Encounter for adequacy testing for hemodialysis: Secondary | ICD-10-CM | POA: Diagnosis not present

## 2017-03-24 DIAGNOSIS — N2581 Secondary hyperparathyroidism of renal origin: Secondary | ICD-10-CM | POA: Diagnosis not present

## 2017-03-24 DIAGNOSIS — D509 Iron deficiency anemia, unspecified: Secondary | ICD-10-CM | POA: Diagnosis not present

## 2017-03-24 DIAGNOSIS — K769 Liver disease, unspecified: Secondary | ICD-10-CM | POA: Diagnosis not present

## 2017-03-24 DIAGNOSIS — D631 Anemia in chronic kidney disease: Secondary | ICD-10-CM | POA: Diagnosis not present

## 2017-03-25 DIAGNOSIS — K769 Liver disease, unspecified: Secondary | ICD-10-CM | POA: Diagnosis not present

## 2017-03-25 DIAGNOSIS — D509 Iron deficiency anemia, unspecified: Secondary | ICD-10-CM | POA: Diagnosis not present

## 2017-03-25 DIAGNOSIS — N2581 Secondary hyperparathyroidism of renal origin: Secondary | ICD-10-CM | POA: Diagnosis not present

## 2017-03-25 DIAGNOSIS — D631 Anemia in chronic kidney disease: Secondary | ICD-10-CM | POA: Diagnosis not present

## 2017-03-25 DIAGNOSIS — Z4931 Encounter for adequacy testing for hemodialysis: Secondary | ICD-10-CM | POA: Diagnosis not present

## 2017-03-25 DIAGNOSIS — N186 End stage renal disease: Secondary | ICD-10-CM | POA: Diagnosis not present

## 2017-03-26 DIAGNOSIS — K769 Liver disease, unspecified: Secondary | ICD-10-CM | POA: Diagnosis not present

## 2017-03-26 DIAGNOSIS — N186 End stage renal disease: Secondary | ICD-10-CM | POA: Diagnosis not present

## 2017-03-26 DIAGNOSIS — D509 Iron deficiency anemia, unspecified: Secondary | ICD-10-CM | POA: Diagnosis not present

## 2017-03-26 DIAGNOSIS — D631 Anemia in chronic kidney disease: Secondary | ICD-10-CM | POA: Diagnosis not present

## 2017-03-26 DIAGNOSIS — N2581 Secondary hyperparathyroidism of renal origin: Secondary | ICD-10-CM | POA: Diagnosis not present

## 2017-03-26 DIAGNOSIS — Z4931 Encounter for adequacy testing for hemodialysis: Secondary | ICD-10-CM | POA: Diagnosis not present

## 2017-03-27 DIAGNOSIS — Z4931 Encounter for adequacy testing for hemodialysis: Secondary | ICD-10-CM | POA: Diagnosis not present

## 2017-03-27 DIAGNOSIS — N186 End stage renal disease: Secondary | ICD-10-CM | POA: Diagnosis not present

## 2017-03-27 DIAGNOSIS — D509 Iron deficiency anemia, unspecified: Secondary | ICD-10-CM | POA: Diagnosis not present

## 2017-03-27 DIAGNOSIS — N2581 Secondary hyperparathyroidism of renal origin: Secondary | ICD-10-CM | POA: Diagnosis not present

## 2017-03-27 DIAGNOSIS — D631 Anemia in chronic kidney disease: Secondary | ICD-10-CM | POA: Diagnosis not present

## 2017-03-27 DIAGNOSIS — K769 Liver disease, unspecified: Secondary | ICD-10-CM | POA: Diagnosis not present

## 2017-03-28 DIAGNOSIS — D631 Anemia in chronic kidney disease: Secondary | ICD-10-CM | POA: Diagnosis not present

## 2017-03-28 DIAGNOSIS — N2581 Secondary hyperparathyroidism of renal origin: Secondary | ICD-10-CM | POA: Diagnosis not present

## 2017-03-28 DIAGNOSIS — D509 Iron deficiency anemia, unspecified: Secondary | ICD-10-CM | POA: Diagnosis not present

## 2017-03-28 DIAGNOSIS — Z4931 Encounter for adequacy testing for hemodialysis: Secondary | ICD-10-CM | POA: Diagnosis not present

## 2017-03-28 DIAGNOSIS — K769 Liver disease, unspecified: Secondary | ICD-10-CM | POA: Diagnosis not present

## 2017-03-28 DIAGNOSIS — N186 End stage renal disease: Secondary | ICD-10-CM | POA: Diagnosis not present

## 2017-03-29 DIAGNOSIS — Z4931 Encounter for adequacy testing for hemodialysis: Secondary | ICD-10-CM | POA: Diagnosis not present

## 2017-03-29 DIAGNOSIS — D631 Anemia in chronic kidney disease: Secondary | ICD-10-CM | POA: Diagnosis not present

## 2017-03-29 DIAGNOSIS — D509 Iron deficiency anemia, unspecified: Secondary | ICD-10-CM | POA: Diagnosis not present

## 2017-03-29 DIAGNOSIS — K769 Liver disease, unspecified: Secondary | ICD-10-CM | POA: Diagnosis not present

## 2017-03-29 DIAGNOSIS — N2581 Secondary hyperparathyroidism of renal origin: Secondary | ICD-10-CM | POA: Diagnosis not present

## 2017-03-29 DIAGNOSIS — N186 End stage renal disease: Secondary | ICD-10-CM | POA: Diagnosis not present

## 2017-03-30 DIAGNOSIS — D509 Iron deficiency anemia, unspecified: Secondary | ICD-10-CM | POA: Diagnosis not present

## 2017-03-30 DIAGNOSIS — D631 Anemia in chronic kidney disease: Secondary | ICD-10-CM | POA: Diagnosis not present

## 2017-03-30 DIAGNOSIS — Z4931 Encounter for adequacy testing for hemodialysis: Secondary | ICD-10-CM | POA: Diagnosis not present

## 2017-03-30 DIAGNOSIS — N2581 Secondary hyperparathyroidism of renal origin: Secondary | ICD-10-CM | POA: Diagnosis not present

## 2017-03-30 DIAGNOSIS — K769 Liver disease, unspecified: Secondary | ICD-10-CM | POA: Diagnosis not present

## 2017-03-30 DIAGNOSIS — N186 End stage renal disease: Secondary | ICD-10-CM | POA: Diagnosis not present

## 2017-03-31 DIAGNOSIS — Z4931 Encounter for adequacy testing for hemodialysis: Secondary | ICD-10-CM | POA: Diagnosis not present

## 2017-03-31 DIAGNOSIS — R8299 Other abnormal findings in urine: Secondary | ICD-10-CM | POA: Diagnosis not present

## 2017-03-31 DIAGNOSIS — E119 Type 2 diabetes mellitus without complications: Secondary | ICD-10-CM | POA: Diagnosis not present

## 2017-03-31 DIAGNOSIS — N186 End stage renal disease: Secondary | ICD-10-CM | POA: Diagnosis not present

## 2017-03-31 DIAGNOSIS — E784 Other hyperlipidemia: Secondary | ICD-10-CM | POA: Diagnosis not present

## 2017-03-31 DIAGNOSIS — N2581 Secondary hyperparathyroidism of renal origin: Secondary | ICD-10-CM | POA: Diagnosis not present

## 2017-03-31 DIAGNOSIS — K769 Liver disease, unspecified: Secondary | ICD-10-CM | POA: Diagnosis not present

## 2017-03-31 DIAGNOSIS — D631 Anemia in chronic kidney disease: Secondary | ICD-10-CM | POA: Diagnosis not present

## 2017-03-31 DIAGNOSIS — E785 Hyperlipidemia, unspecified: Secondary | ICD-10-CM | POA: Diagnosis not present

## 2017-03-31 DIAGNOSIS — D509 Iron deficiency anemia, unspecified: Secondary | ICD-10-CM | POA: Diagnosis not present

## 2017-04-01 DIAGNOSIS — M50322 Other cervical disc degeneration at C5-C6 level: Secondary | ICD-10-CM | POA: Diagnosis not present

## 2017-04-01 DIAGNOSIS — Z4931 Encounter for adequacy testing for hemodialysis: Secondary | ICD-10-CM | POA: Diagnosis not present

## 2017-04-01 DIAGNOSIS — D509 Iron deficiency anemia, unspecified: Secondary | ICD-10-CM | POA: Diagnosis not present

## 2017-04-01 DIAGNOSIS — N2581 Secondary hyperparathyroidism of renal origin: Secondary | ICD-10-CM | POA: Diagnosis not present

## 2017-04-01 DIAGNOSIS — M9902 Segmental and somatic dysfunction of thoracic region: Secondary | ICD-10-CM | POA: Diagnosis not present

## 2017-04-01 DIAGNOSIS — M9901 Segmental and somatic dysfunction of cervical region: Secondary | ICD-10-CM | POA: Diagnosis not present

## 2017-04-01 DIAGNOSIS — K769 Liver disease, unspecified: Secondary | ICD-10-CM | POA: Diagnosis not present

## 2017-04-01 DIAGNOSIS — M5134 Other intervertebral disc degeneration, thoracic region: Secondary | ICD-10-CM | POA: Diagnosis not present

## 2017-04-01 DIAGNOSIS — N186 End stage renal disease: Secondary | ICD-10-CM | POA: Diagnosis not present

## 2017-04-01 DIAGNOSIS — D631 Anemia in chronic kidney disease: Secondary | ICD-10-CM | POA: Diagnosis not present

## 2017-04-02 DIAGNOSIS — N186 End stage renal disease: Secondary | ICD-10-CM | POA: Diagnosis not present

## 2017-04-02 DIAGNOSIS — K769 Liver disease, unspecified: Secondary | ICD-10-CM | POA: Diagnosis not present

## 2017-04-02 DIAGNOSIS — N2581 Secondary hyperparathyroidism of renal origin: Secondary | ICD-10-CM | POA: Diagnosis not present

## 2017-04-02 DIAGNOSIS — D509 Iron deficiency anemia, unspecified: Secondary | ICD-10-CM | POA: Diagnosis not present

## 2017-04-02 DIAGNOSIS — D631 Anemia in chronic kidney disease: Secondary | ICD-10-CM | POA: Diagnosis not present

## 2017-04-02 DIAGNOSIS — Z4931 Encounter for adequacy testing for hemodialysis: Secondary | ICD-10-CM | POA: Diagnosis not present

## 2017-04-03 DIAGNOSIS — N186 End stage renal disease: Secondary | ICD-10-CM | POA: Diagnosis not present

## 2017-04-03 DIAGNOSIS — N2581 Secondary hyperparathyroidism of renal origin: Secondary | ICD-10-CM | POA: Diagnosis not present

## 2017-04-03 DIAGNOSIS — Z4931 Encounter for adequacy testing for hemodialysis: Secondary | ICD-10-CM | POA: Diagnosis not present

## 2017-04-03 DIAGNOSIS — D509 Iron deficiency anemia, unspecified: Secondary | ICD-10-CM | POA: Diagnosis not present

## 2017-04-03 DIAGNOSIS — D631 Anemia in chronic kidney disease: Secondary | ICD-10-CM | POA: Diagnosis not present

## 2017-04-03 DIAGNOSIS — K769 Liver disease, unspecified: Secondary | ICD-10-CM | POA: Diagnosis not present

## 2017-04-03 DIAGNOSIS — F4320 Adjustment disorder, unspecified: Secondary | ICD-10-CM | POA: Diagnosis not present

## 2017-04-04 DIAGNOSIS — D509 Iron deficiency anemia, unspecified: Secondary | ICD-10-CM | POA: Diagnosis not present

## 2017-04-04 DIAGNOSIS — Z4931 Encounter for adequacy testing for hemodialysis: Secondary | ICD-10-CM | POA: Diagnosis not present

## 2017-04-04 DIAGNOSIS — D631 Anemia in chronic kidney disease: Secondary | ICD-10-CM | POA: Diagnosis not present

## 2017-04-04 DIAGNOSIS — N2581 Secondary hyperparathyroidism of renal origin: Secondary | ICD-10-CM | POA: Diagnosis not present

## 2017-04-04 DIAGNOSIS — N186 End stage renal disease: Secondary | ICD-10-CM | POA: Diagnosis not present

## 2017-04-04 DIAGNOSIS — K769 Liver disease, unspecified: Secondary | ICD-10-CM | POA: Diagnosis not present

## 2017-04-05 DIAGNOSIS — D631 Anemia in chronic kidney disease: Secondary | ICD-10-CM | POA: Diagnosis not present

## 2017-04-05 DIAGNOSIS — N2581 Secondary hyperparathyroidism of renal origin: Secondary | ICD-10-CM | POA: Diagnosis not present

## 2017-04-05 DIAGNOSIS — D509 Iron deficiency anemia, unspecified: Secondary | ICD-10-CM | POA: Diagnosis not present

## 2017-04-05 DIAGNOSIS — K769 Liver disease, unspecified: Secondary | ICD-10-CM | POA: Diagnosis not present

## 2017-04-05 DIAGNOSIS — N186 End stage renal disease: Secondary | ICD-10-CM | POA: Diagnosis not present

## 2017-04-05 DIAGNOSIS — Z4931 Encounter for adequacy testing for hemodialysis: Secondary | ICD-10-CM | POA: Diagnosis not present

## 2017-04-06 DIAGNOSIS — N2581 Secondary hyperparathyroidism of renal origin: Secondary | ICD-10-CM | POA: Diagnosis not present

## 2017-04-06 DIAGNOSIS — D509 Iron deficiency anemia, unspecified: Secondary | ICD-10-CM | POA: Diagnosis not present

## 2017-04-06 DIAGNOSIS — N186 End stage renal disease: Secondary | ICD-10-CM | POA: Diagnosis not present

## 2017-04-06 DIAGNOSIS — K769 Liver disease, unspecified: Secondary | ICD-10-CM | POA: Diagnosis not present

## 2017-04-06 DIAGNOSIS — D631 Anemia in chronic kidney disease: Secondary | ICD-10-CM | POA: Diagnosis not present

## 2017-04-06 DIAGNOSIS — Z4931 Encounter for adequacy testing for hemodialysis: Secondary | ICD-10-CM | POA: Diagnosis not present

## 2017-04-07 DIAGNOSIS — N186 End stage renal disease: Secondary | ICD-10-CM | POA: Diagnosis not present

## 2017-04-07 DIAGNOSIS — D509 Iron deficiency anemia, unspecified: Secondary | ICD-10-CM | POA: Diagnosis not present

## 2017-04-07 DIAGNOSIS — N2581 Secondary hyperparathyroidism of renal origin: Secondary | ICD-10-CM | POA: Diagnosis not present

## 2017-04-07 DIAGNOSIS — D631 Anemia in chronic kidney disease: Secondary | ICD-10-CM | POA: Diagnosis not present

## 2017-04-07 DIAGNOSIS — Z4931 Encounter for adequacy testing for hemodialysis: Secondary | ICD-10-CM | POA: Diagnosis not present

## 2017-04-07 DIAGNOSIS — K769 Liver disease, unspecified: Secondary | ICD-10-CM | POA: Diagnosis not present

## 2017-04-08 DIAGNOSIS — K769 Liver disease, unspecified: Secondary | ICD-10-CM | POA: Diagnosis not present

## 2017-04-08 DIAGNOSIS — Z4931 Encounter for adequacy testing for hemodialysis: Secondary | ICD-10-CM | POA: Diagnosis not present

## 2017-04-08 DIAGNOSIS — D509 Iron deficiency anemia, unspecified: Secondary | ICD-10-CM | POA: Diagnosis not present

## 2017-04-08 DIAGNOSIS — D631 Anemia in chronic kidney disease: Secondary | ICD-10-CM | POA: Diagnosis not present

## 2017-04-08 DIAGNOSIS — N2581 Secondary hyperparathyroidism of renal origin: Secondary | ICD-10-CM | POA: Diagnosis not present

## 2017-04-08 DIAGNOSIS — N186 End stage renal disease: Secondary | ICD-10-CM | POA: Diagnosis not present

## 2017-04-09 DIAGNOSIS — M5134 Other intervertebral disc degeneration, thoracic region: Secondary | ICD-10-CM | POA: Diagnosis not present

## 2017-04-09 DIAGNOSIS — K769 Liver disease, unspecified: Secondary | ICD-10-CM | POA: Diagnosis not present

## 2017-04-09 DIAGNOSIS — D509 Iron deficiency anemia, unspecified: Secondary | ICD-10-CM | POA: Diagnosis not present

## 2017-04-09 DIAGNOSIS — M9901 Segmental and somatic dysfunction of cervical region: Secondary | ICD-10-CM | POA: Diagnosis not present

## 2017-04-09 DIAGNOSIS — M9902 Segmental and somatic dysfunction of thoracic region: Secondary | ICD-10-CM | POA: Diagnosis not present

## 2017-04-09 DIAGNOSIS — M50322 Other cervical disc degeneration at C5-C6 level: Secondary | ICD-10-CM | POA: Diagnosis not present

## 2017-04-09 DIAGNOSIS — Z4931 Encounter for adequacy testing for hemodialysis: Secondary | ICD-10-CM | POA: Diagnosis not present

## 2017-04-09 DIAGNOSIS — D631 Anemia in chronic kidney disease: Secondary | ICD-10-CM | POA: Diagnosis not present

## 2017-04-09 DIAGNOSIS — N186 End stage renal disease: Secondary | ICD-10-CM | POA: Diagnosis not present

## 2017-04-09 DIAGNOSIS — N2581 Secondary hyperparathyroidism of renal origin: Secondary | ICD-10-CM | POA: Diagnosis not present

## 2017-04-10 DIAGNOSIS — N2581 Secondary hyperparathyroidism of renal origin: Secondary | ICD-10-CM | POA: Diagnosis not present

## 2017-04-10 DIAGNOSIS — N186 End stage renal disease: Secondary | ICD-10-CM | POA: Diagnosis not present

## 2017-04-10 DIAGNOSIS — D509 Iron deficiency anemia, unspecified: Secondary | ICD-10-CM | POA: Diagnosis not present

## 2017-04-10 DIAGNOSIS — Z4931 Encounter for adequacy testing for hemodialysis: Secondary | ICD-10-CM | POA: Diagnosis not present

## 2017-04-10 DIAGNOSIS — D631 Anemia in chronic kidney disease: Secondary | ICD-10-CM | POA: Diagnosis not present

## 2017-04-10 DIAGNOSIS — K769 Liver disease, unspecified: Secondary | ICD-10-CM | POA: Diagnosis not present

## 2017-04-11 DIAGNOSIS — N186 End stage renal disease: Secondary | ICD-10-CM | POA: Diagnosis not present

## 2017-04-11 DIAGNOSIS — Z4931 Encounter for adequacy testing for hemodialysis: Secondary | ICD-10-CM | POA: Diagnosis not present

## 2017-04-11 DIAGNOSIS — D631 Anemia in chronic kidney disease: Secondary | ICD-10-CM | POA: Diagnosis not present

## 2017-04-11 DIAGNOSIS — D509 Iron deficiency anemia, unspecified: Secondary | ICD-10-CM | POA: Diagnosis not present

## 2017-04-11 DIAGNOSIS — K769 Liver disease, unspecified: Secondary | ICD-10-CM | POA: Diagnosis not present

## 2017-04-11 DIAGNOSIS — N2581 Secondary hyperparathyroidism of renal origin: Secondary | ICD-10-CM | POA: Diagnosis not present

## 2017-04-12 DIAGNOSIS — K769 Liver disease, unspecified: Secondary | ICD-10-CM | POA: Diagnosis not present

## 2017-04-12 DIAGNOSIS — Z4931 Encounter for adequacy testing for hemodialysis: Secondary | ICD-10-CM | POA: Diagnosis not present

## 2017-04-12 DIAGNOSIS — N186 End stage renal disease: Secondary | ICD-10-CM | POA: Diagnosis not present

## 2017-04-12 DIAGNOSIS — N2581 Secondary hyperparathyroidism of renal origin: Secondary | ICD-10-CM | POA: Diagnosis not present

## 2017-04-12 DIAGNOSIS — D509 Iron deficiency anemia, unspecified: Secondary | ICD-10-CM | POA: Diagnosis not present

## 2017-04-12 DIAGNOSIS — D631 Anemia in chronic kidney disease: Secondary | ICD-10-CM | POA: Diagnosis not present

## 2017-04-13 DIAGNOSIS — N2581 Secondary hyperparathyroidism of renal origin: Secondary | ICD-10-CM | POA: Diagnosis not present

## 2017-04-13 DIAGNOSIS — D509 Iron deficiency anemia, unspecified: Secondary | ICD-10-CM | POA: Diagnosis not present

## 2017-04-13 DIAGNOSIS — K769 Liver disease, unspecified: Secondary | ICD-10-CM | POA: Diagnosis not present

## 2017-04-13 DIAGNOSIS — Z4931 Encounter for adequacy testing for hemodialysis: Secondary | ICD-10-CM | POA: Diagnosis not present

## 2017-04-13 DIAGNOSIS — D631 Anemia in chronic kidney disease: Secondary | ICD-10-CM | POA: Diagnosis not present

## 2017-04-13 DIAGNOSIS — N186 End stage renal disease: Secondary | ICD-10-CM | POA: Diagnosis not present

## 2017-04-14 DIAGNOSIS — Z4931 Encounter for adequacy testing for hemodialysis: Secondary | ICD-10-CM | POA: Diagnosis not present

## 2017-04-14 DIAGNOSIS — K769 Liver disease, unspecified: Secondary | ICD-10-CM | POA: Diagnosis not present

## 2017-04-14 DIAGNOSIS — D631 Anemia in chronic kidney disease: Secondary | ICD-10-CM | POA: Diagnosis not present

## 2017-04-14 DIAGNOSIS — N2581 Secondary hyperparathyroidism of renal origin: Secondary | ICD-10-CM | POA: Diagnosis not present

## 2017-04-14 DIAGNOSIS — N186 End stage renal disease: Secondary | ICD-10-CM | POA: Diagnosis not present

## 2017-04-14 DIAGNOSIS — D509 Iron deficiency anemia, unspecified: Secondary | ICD-10-CM | POA: Diagnosis not present

## 2017-04-15 DIAGNOSIS — N2581 Secondary hyperparathyroidism of renal origin: Secondary | ICD-10-CM | POA: Diagnosis not present

## 2017-04-15 DIAGNOSIS — D631 Anemia in chronic kidney disease: Secondary | ICD-10-CM | POA: Diagnosis not present

## 2017-04-15 DIAGNOSIS — K769 Liver disease, unspecified: Secondary | ICD-10-CM | POA: Diagnosis not present

## 2017-04-15 DIAGNOSIS — Z4931 Encounter for adequacy testing for hemodialysis: Secondary | ICD-10-CM | POA: Diagnosis not present

## 2017-04-15 DIAGNOSIS — N186 End stage renal disease: Secondary | ICD-10-CM | POA: Diagnosis not present

## 2017-04-15 DIAGNOSIS — D509 Iron deficiency anemia, unspecified: Secondary | ICD-10-CM | POA: Diagnosis not present

## 2017-04-16 DIAGNOSIS — Z4931 Encounter for adequacy testing for hemodialysis: Secondary | ICD-10-CM | POA: Diagnosis not present

## 2017-04-16 DIAGNOSIS — N186 End stage renal disease: Secondary | ICD-10-CM | POA: Diagnosis not present

## 2017-04-16 DIAGNOSIS — K769 Liver disease, unspecified: Secondary | ICD-10-CM | POA: Diagnosis not present

## 2017-04-16 DIAGNOSIS — D631 Anemia in chronic kidney disease: Secondary | ICD-10-CM | POA: Diagnosis not present

## 2017-04-16 DIAGNOSIS — D509 Iron deficiency anemia, unspecified: Secondary | ICD-10-CM | POA: Diagnosis not present

## 2017-04-16 DIAGNOSIS — N2581 Secondary hyperparathyroidism of renal origin: Secondary | ICD-10-CM | POA: Diagnosis not present

## 2017-04-17 DIAGNOSIS — Z4931 Encounter for adequacy testing for hemodialysis: Secondary | ICD-10-CM | POA: Diagnosis not present

## 2017-04-17 DIAGNOSIS — K769 Liver disease, unspecified: Secondary | ICD-10-CM | POA: Diagnosis not present

## 2017-04-17 DIAGNOSIS — N186 End stage renal disease: Secondary | ICD-10-CM | POA: Diagnosis not present

## 2017-04-17 DIAGNOSIS — D631 Anemia in chronic kidney disease: Secondary | ICD-10-CM | POA: Diagnosis not present

## 2017-04-17 DIAGNOSIS — N2581 Secondary hyperparathyroidism of renal origin: Secondary | ICD-10-CM | POA: Diagnosis not present

## 2017-04-17 DIAGNOSIS — D509 Iron deficiency anemia, unspecified: Secondary | ICD-10-CM | POA: Diagnosis not present

## 2017-04-18 DIAGNOSIS — K769 Liver disease, unspecified: Secondary | ICD-10-CM | POA: Diagnosis not present

## 2017-04-18 DIAGNOSIS — D509 Iron deficiency anemia, unspecified: Secondary | ICD-10-CM | POA: Diagnosis not present

## 2017-04-18 DIAGNOSIS — N2581 Secondary hyperparathyroidism of renal origin: Secondary | ICD-10-CM | POA: Diagnosis not present

## 2017-04-18 DIAGNOSIS — Z4931 Encounter for adequacy testing for hemodialysis: Secondary | ICD-10-CM | POA: Diagnosis not present

## 2017-04-18 DIAGNOSIS — N186 End stage renal disease: Secondary | ICD-10-CM | POA: Diagnosis not present

## 2017-04-18 DIAGNOSIS — D631 Anemia in chronic kidney disease: Secondary | ICD-10-CM | POA: Diagnosis not present

## 2017-04-18 DIAGNOSIS — F4320 Adjustment disorder, unspecified: Secondary | ICD-10-CM | POA: Diagnosis not present

## 2017-04-19 DIAGNOSIS — D509 Iron deficiency anemia, unspecified: Secondary | ICD-10-CM | POA: Diagnosis not present

## 2017-04-19 DIAGNOSIS — Z4931 Encounter for adequacy testing for hemodialysis: Secondary | ICD-10-CM | POA: Diagnosis not present

## 2017-04-19 DIAGNOSIS — N186 End stage renal disease: Secondary | ICD-10-CM | POA: Diagnosis not present

## 2017-04-19 DIAGNOSIS — K769 Liver disease, unspecified: Secondary | ICD-10-CM | POA: Diagnosis not present

## 2017-04-19 DIAGNOSIS — D631 Anemia in chronic kidney disease: Secondary | ICD-10-CM | POA: Diagnosis not present

## 2017-04-19 DIAGNOSIS — N2581 Secondary hyperparathyroidism of renal origin: Secondary | ICD-10-CM | POA: Diagnosis not present

## 2017-04-20 DIAGNOSIS — Z4931 Encounter for adequacy testing for hemodialysis: Secondary | ICD-10-CM | POA: Diagnosis not present

## 2017-04-20 DIAGNOSIS — N186 End stage renal disease: Secondary | ICD-10-CM | POA: Diagnosis not present

## 2017-04-20 DIAGNOSIS — N2581 Secondary hyperparathyroidism of renal origin: Secondary | ICD-10-CM | POA: Diagnosis not present

## 2017-04-20 DIAGNOSIS — K769 Liver disease, unspecified: Secondary | ICD-10-CM | POA: Diagnosis not present

## 2017-04-20 DIAGNOSIS — D509 Iron deficiency anemia, unspecified: Secondary | ICD-10-CM | POA: Diagnosis not present

## 2017-04-20 DIAGNOSIS — D631 Anemia in chronic kidney disease: Secondary | ICD-10-CM | POA: Diagnosis not present

## 2017-04-21 DIAGNOSIS — K769 Liver disease, unspecified: Secondary | ICD-10-CM | POA: Diagnosis not present

## 2017-04-21 DIAGNOSIS — N2581 Secondary hyperparathyroidism of renal origin: Secondary | ICD-10-CM | POA: Diagnosis not present

## 2017-04-21 DIAGNOSIS — D509 Iron deficiency anemia, unspecified: Secondary | ICD-10-CM | POA: Diagnosis not present

## 2017-04-21 DIAGNOSIS — Z4931 Encounter for adequacy testing for hemodialysis: Secondary | ICD-10-CM | POA: Diagnosis not present

## 2017-04-21 DIAGNOSIS — N186 End stage renal disease: Secondary | ICD-10-CM | POA: Diagnosis not present

## 2017-04-21 DIAGNOSIS — D631 Anemia in chronic kidney disease: Secondary | ICD-10-CM | POA: Diagnosis not present

## 2017-04-22 DIAGNOSIS — Z992 Dependence on renal dialysis: Secondary | ICD-10-CM | POA: Diagnosis not present

## 2017-04-22 DIAGNOSIS — N186 End stage renal disease: Secondary | ICD-10-CM | POA: Diagnosis not present

## 2017-04-22 DIAGNOSIS — T8612 Kidney transplant failure: Secondary | ICD-10-CM | POA: Diagnosis not present

## 2017-04-22 DIAGNOSIS — D631 Anemia in chronic kidney disease: Secondary | ICD-10-CM | POA: Diagnosis not present

## 2017-04-22 DIAGNOSIS — N2581 Secondary hyperparathyroidism of renal origin: Secondary | ICD-10-CM | POA: Diagnosis not present

## 2017-04-22 DIAGNOSIS — Z4931 Encounter for adequacy testing for hemodialysis: Secondary | ICD-10-CM | POA: Diagnosis not present

## 2017-04-22 DIAGNOSIS — D509 Iron deficiency anemia, unspecified: Secondary | ICD-10-CM | POA: Diagnosis not present

## 2017-04-22 DIAGNOSIS — K769 Liver disease, unspecified: Secondary | ICD-10-CM | POA: Diagnosis not present

## 2017-04-23 DIAGNOSIS — N2589 Other disorders resulting from impaired renal tubular function: Secondary | ICD-10-CM | POA: Diagnosis not present

## 2017-04-23 DIAGNOSIS — M50322 Other cervical disc degeneration at C5-C6 level: Secondary | ICD-10-CM | POA: Diagnosis not present

## 2017-04-23 DIAGNOSIS — M9902 Segmental and somatic dysfunction of thoracic region: Secondary | ICD-10-CM | POA: Diagnosis not present

## 2017-04-23 DIAGNOSIS — M5134 Other intervertebral disc degeneration, thoracic region: Secondary | ICD-10-CM | POA: Diagnosis not present

## 2017-04-23 DIAGNOSIS — M9901 Segmental and somatic dysfunction of cervical region: Secondary | ICD-10-CM | POA: Diagnosis not present

## 2017-04-23 DIAGNOSIS — N2581 Secondary hyperparathyroidism of renal origin: Secondary | ICD-10-CM | POA: Diagnosis not present

## 2017-04-23 DIAGNOSIS — D509 Iron deficiency anemia, unspecified: Secondary | ICD-10-CM | POA: Diagnosis not present

## 2017-04-23 DIAGNOSIS — Z79899 Other long term (current) drug therapy: Secondary | ICD-10-CM | POA: Diagnosis not present

## 2017-04-23 DIAGNOSIS — N186 End stage renal disease: Secondary | ICD-10-CM | POA: Diagnosis not present

## 2017-04-23 DIAGNOSIS — D631 Anemia in chronic kidney disease: Secondary | ICD-10-CM | POA: Diagnosis not present

## 2017-04-23 DIAGNOSIS — K769 Liver disease, unspecified: Secondary | ICD-10-CM | POA: Diagnosis not present

## 2017-04-23 DIAGNOSIS — E46 Unspecified protein-calorie malnutrition: Secondary | ICD-10-CM | POA: Diagnosis not present

## 2017-04-24 DIAGNOSIS — N2581 Secondary hyperparathyroidism of renal origin: Secondary | ICD-10-CM | POA: Diagnosis not present

## 2017-04-24 DIAGNOSIS — R8299 Other abnormal findings in urine: Secondary | ICD-10-CM | POA: Diagnosis not present

## 2017-04-24 DIAGNOSIS — D631 Anemia in chronic kidney disease: Secondary | ICD-10-CM | POA: Diagnosis not present

## 2017-04-24 DIAGNOSIS — E46 Unspecified protein-calorie malnutrition: Secondary | ICD-10-CM | POA: Diagnosis not present

## 2017-04-24 DIAGNOSIS — N2589 Other disorders resulting from impaired renal tubular function: Secondary | ICD-10-CM | POA: Diagnosis not present

## 2017-04-24 DIAGNOSIS — N186 End stage renal disease: Secondary | ICD-10-CM | POA: Diagnosis not present

## 2017-04-24 DIAGNOSIS — D509 Iron deficiency anemia, unspecified: Secondary | ICD-10-CM | POA: Diagnosis not present

## 2017-04-25 DIAGNOSIS — N2581 Secondary hyperparathyroidism of renal origin: Secondary | ICD-10-CM | POA: Diagnosis not present

## 2017-04-25 DIAGNOSIS — E46 Unspecified protein-calorie malnutrition: Secondary | ICD-10-CM | POA: Diagnosis not present

## 2017-04-25 DIAGNOSIS — D631 Anemia in chronic kidney disease: Secondary | ICD-10-CM | POA: Diagnosis not present

## 2017-04-25 DIAGNOSIS — N186 End stage renal disease: Secondary | ICD-10-CM | POA: Diagnosis not present

## 2017-04-25 DIAGNOSIS — N2589 Other disorders resulting from impaired renal tubular function: Secondary | ICD-10-CM | POA: Diagnosis not present

## 2017-04-25 DIAGNOSIS — D509 Iron deficiency anemia, unspecified: Secondary | ICD-10-CM | POA: Diagnosis not present

## 2017-04-26 DIAGNOSIS — D631 Anemia in chronic kidney disease: Secondary | ICD-10-CM | POA: Diagnosis not present

## 2017-04-26 DIAGNOSIS — N2589 Other disorders resulting from impaired renal tubular function: Secondary | ICD-10-CM | POA: Diagnosis not present

## 2017-04-26 DIAGNOSIS — E46 Unspecified protein-calorie malnutrition: Secondary | ICD-10-CM | POA: Diagnosis not present

## 2017-04-26 DIAGNOSIS — N186 End stage renal disease: Secondary | ICD-10-CM | POA: Diagnosis not present

## 2017-04-26 DIAGNOSIS — N2581 Secondary hyperparathyroidism of renal origin: Secondary | ICD-10-CM | POA: Diagnosis not present

## 2017-04-26 DIAGNOSIS — D509 Iron deficiency anemia, unspecified: Secondary | ICD-10-CM | POA: Diagnosis not present

## 2017-04-27 DIAGNOSIS — N2581 Secondary hyperparathyroidism of renal origin: Secondary | ICD-10-CM | POA: Diagnosis not present

## 2017-04-27 DIAGNOSIS — N2589 Other disorders resulting from impaired renal tubular function: Secondary | ICD-10-CM | POA: Diagnosis not present

## 2017-04-27 DIAGNOSIS — N186 End stage renal disease: Secondary | ICD-10-CM | POA: Diagnosis not present

## 2017-04-27 DIAGNOSIS — D631 Anemia in chronic kidney disease: Secondary | ICD-10-CM | POA: Diagnosis not present

## 2017-04-27 DIAGNOSIS — E46 Unspecified protein-calorie malnutrition: Secondary | ICD-10-CM | POA: Diagnosis not present

## 2017-04-27 DIAGNOSIS — D509 Iron deficiency anemia, unspecified: Secondary | ICD-10-CM | POA: Diagnosis not present

## 2017-04-28 DIAGNOSIS — D631 Anemia in chronic kidney disease: Secondary | ICD-10-CM | POA: Diagnosis not present

## 2017-04-28 DIAGNOSIS — N2581 Secondary hyperparathyroidism of renal origin: Secondary | ICD-10-CM | POA: Diagnosis not present

## 2017-04-28 DIAGNOSIS — N2589 Other disorders resulting from impaired renal tubular function: Secondary | ICD-10-CM | POA: Diagnosis not present

## 2017-04-28 DIAGNOSIS — D509 Iron deficiency anemia, unspecified: Secondary | ICD-10-CM | POA: Diagnosis not present

## 2017-04-28 DIAGNOSIS — E46 Unspecified protein-calorie malnutrition: Secondary | ICD-10-CM | POA: Diagnosis not present

## 2017-04-28 DIAGNOSIS — N186 End stage renal disease: Secondary | ICD-10-CM | POA: Diagnosis not present

## 2017-04-29 DIAGNOSIS — N186 End stage renal disease: Secondary | ICD-10-CM | POA: Diagnosis not present

## 2017-04-29 DIAGNOSIS — N2581 Secondary hyperparathyroidism of renal origin: Secondary | ICD-10-CM | POA: Diagnosis not present

## 2017-04-29 DIAGNOSIS — E46 Unspecified protein-calorie malnutrition: Secondary | ICD-10-CM | POA: Diagnosis not present

## 2017-04-29 DIAGNOSIS — D631 Anemia in chronic kidney disease: Secondary | ICD-10-CM | POA: Diagnosis not present

## 2017-04-29 DIAGNOSIS — D509 Iron deficiency anemia, unspecified: Secondary | ICD-10-CM | POA: Diagnosis not present

## 2017-04-29 DIAGNOSIS — N2589 Other disorders resulting from impaired renal tubular function: Secondary | ICD-10-CM | POA: Diagnosis not present

## 2017-04-30 DIAGNOSIS — D631 Anemia in chronic kidney disease: Secondary | ICD-10-CM | POA: Diagnosis not present

## 2017-04-30 DIAGNOSIS — N2589 Other disorders resulting from impaired renal tubular function: Secondary | ICD-10-CM | POA: Diagnosis not present

## 2017-04-30 DIAGNOSIS — N2581 Secondary hyperparathyroidism of renal origin: Secondary | ICD-10-CM | POA: Diagnosis not present

## 2017-04-30 DIAGNOSIS — E46 Unspecified protein-calorie malnutrition: Secondary | ICD-10-CM | POA: Diagnosis not present

## 2017-04-30 DIAGNOSIS — D509 Iron deficiency anemia, unspecified: Secondary | ICD-10-CM | POA: Diagnosis not present

## 2017-04-30 DIAGNOSIS — N186 End stage renal disease: Secondary | ICD-10-CM | POA: Diagnosis not present

## 2017-05-01 DIAGNOSIS — D631 Anemia in chronic kidney disease: Secondary | ICD-10-CM | POA: Diagnosis not present

## 2017-05-01 DIAGNOSIS — D509 Iron deficiency anemia, unspecified: Secondary | ICD-10-CM | POA: Diagnosis not present

## 2017-05-01 DIAGNOSIS — E46 Unspecified protein-calorie malnutrition: Secondary | ICD-10-CM | POA: Diagnosis not present

## 2017-05-01 DIAGNOSIS — N2581 Secondary hyperparathyroidism of renal origin: Secondary | ICD-10-CM | POA: Diagnosis not present

## 2017-05-01 DIAGNOSIS — N2589 Other disorders resulting from impaired renal tubular function: Secondary | ICD-10-CM | POA: Diagnosis not present

## 2017-05-01 DIAGNOSIS — N186 End stage renal disease: Secondary | ICD-10-CM | POA: Diagnosis not present

## 2017-05-02 DIAGNOSIS — D631 Anemia in chronic kidney disease: Secondary | ICD-10-CM | POA: Diagnosis not present

## 2017-05-02 DIAGNOSIS — F4320 Adjustment disorder, unspecified: Secondary | ICD-10-CM | POA: Diagnosis not present

## 2017-05-02 DIAGNOSIS — N2589 Other disorders resulting from impaired renal tubular function: Secondary | ICD-10-CM | POA: Diagnosis not present

## 2017-05-02 DIAGNOSIS — D509 Iron deficiency anemia, unspecified: Secondary | ICD-10-CM | POA: Diagnosis not present

## 2017-05-02 DIAGNOSIS — N2581 Secondary hyperparathyroidism of renal origin: Secondary | ICD-10-CM | POA: Diagnosis not present

## 2017-05-02 DIAGNOSIS — E46 Unspecified protein-calorie malnutrition: Secondary | ICD-10-CM | POA: Diagnosis not present

## 2017-05-02 DIAGNOSIS — N186 End stage renal disease: Secondary | ICD-10-CM | POA: Diagnosis not present

## 2017-05-03 DIAGNOSIS — N2581 Secondary hyperparathyroidism of renal origin: Secondary | ICD-10-CM | POA: Diagnosis not present

## 2017-05-03 DIAGNOSIS — D631 Anemia in chronic kidney disease: Secondary | ICD-10-CM | POA: Diagnosis not present

## 2017-05-03 DIAGNOSIS — E46 Unspecified protein-calorie malnutrition: Secondary | ICD-10-CM | POA: Diagnosis not present

## 2017-05-03 DIAGNOSIS — D509 Iron deficiency anemia, unspecified: Secondary | ICD-10-CM | POA: Diagnosis not present

## 2017-05-03 DIAGNOSIS — N2589 Other disorders resulting from impaired renal tubular function: Secondary | ICD-10-CM | POA: Diagnosis not present

## 2017-05-03 DIAGNOSIS — N186 End stage renal disease: Secondary | ICD-10-CM | POA: Diagnosis not present

## 2017-05-04 DIAGNOSIS — N2589 Other disorders resulting from impaired renal tubular function: Secondary | ICD-10-CM | POA: Diagnosis not present

## 2017-05-04 DIAGNOSIS — D631 Anemia in chronic kidney disease: Secondary | ICD-10-CM | POA: Diagnosis not present

## 2017-05-04 DIAGNOSIS — E46 Unspecified protein-calorie malnutrition: Secondary | ICD-10-CM | POA: Diagnosis not present

## 2017-05-04 DIAGNOSIS — D509 Iron deficiency anemia, unspecified: Secondary | ICD-10-CM | POA: Diagnosis not present

## 2017-05-04 DIAGNOSIS — N186 End stage renal disease: Secondary | ICD-10-CM | POA: Diagnosis not present

## 2017-05-04 DIAGNOSIS — N2581 Secondary hyperparathyroidism of renal origin: Secondary | ICD-10-CM | POA: Diagnosis not present

## 2017-05-05 DIAGNOSIS — N2581 Secondary hyperparathyroidism of renal origin: Secondary | ICD-10-CM | POA: Diagnosis not present

## 2017-05-05 DIAGNOSIS — D509 Iron deficiency anemia, unspecified: Secondary | ICD-10-CM | POA: Diagnosis not present

## 2017-05-05 DIAGNOSIS — D631 Anemia in chronic kidney disease: Secondary | ICD-10-CM | POA: Diagnosis not present

## 2017-05-05 DIAGNOSIS — N2589 Other disorders resulting from impaired renal tubular function: Secondary | ICD-10-CM | POA: Diagnosis not present

## 2017-05-05 DIAGNOSIS — E46 Unspecified protein-calorie malnutrition: Secondary | ICD-10-CM | POA: Diagnosis not present

## 2017-05-05 DIAGNOSIS — N186 End stage renal disease: Secondary | ICD-10-CM | POA: Diagnosis not present

## 2017-05-06 DIAGNOSIS — N2589 Other disorders resulting from impaired renal tubular function: Secondary | ICD-10-CM | POA: Diagnosis not present

## 2017-05-06 DIAGNOSIS — E46 Unspecified protein-calorie malnutrition: Secondary | ICD-10-CM | POA: Diagnosis not present

## 2017-05-06 DIAGNOSIS — N186 End stage renal disease: Secondary | ICD-10-CM | POA: Diagnosis not present

## 2017-05-06 DIAGNOSIS — D631 Anemia in chronic kidney disease: Secondary | ICD-10-CM | POA: Diagnosis not present

## 2017-05-06 DIAGNOSIS — N2581 Secondary hyperparathyroidism of renal origin: Secondary | ICD-10-CM | POA: Diagnosis not present

## 2017-05-06 DIAGNOSIS — D509 Iron deficiency anemia, unspecified: Secondary | ICD-10-CM | POA: Diagnosis not present

## 2017-05-07 DIAGNOSIS — E46 Unspecified protein-calorie malnutrition: Secondary | ICD-10-CM | POA: Diagnosis not present

## 2017-05-07 DIAGNOSIS — D631 Anemia in chronic kidney disease: Secondary | ICD-10-CM | POA: Diagnosis not present

## 2017-05-07 DIAGNOSIS — D509 Iron deficiency anemia, unspecified: Secondary | ICD-10-CM | POA: Diagnosis not present

## 2017-05-07 DIAGNOSIS — N186 End stage renal disease: Secondary | ICD-10-CM | POA: Diagnosis not present

## 2017-05-07 DIAGNOSIS — N2589 Other disorders resulting from impaired renal tubular function: Secondary | ICD-10-CM | POA: Diagnosis not present

## 2017-05-07 DIAGNOSIS — N2581 Secondary hyperparathyroidism of renal origin: Secondary | ICD-10-CM | POA: Diagnosis not present

## 2017-05-08 DIAGNOSIS — N186 End stage renal disease: Secondary | ICD-10-CM | POA: Diagnosis not present

## 2017-05-08 DIAGNOSIS — D631 Anemia in chronic kidney disease: Secondary | ICD-10-CM | POA: Diagnosis not present

## 2017-05-08 DIAGNOSIS — N2589 Other disorders resulting from impaired renal tubular function: Secondary | ICD-10-CM | POA: Diagnosis not present

## 2017-05-08 DIAGNOSIS — R938 Abnormal findings on diagnostic imaging of other specified body structures: Secondary | ICD-10-CM | POA: Diagnosis not present

## 2017-05-08 DIAGNOSIS — R05 Cough: Secondary | ICD-10-CM | POA: Diagnosis not present

## 2017-05-08 DIAGNOSIS — Z01810 Encounter for preprocedural cardiovascular examination: Secondary | ICD-10-CM | POA: Diagnosis not present

## 2017-05-08 DIAGNOSIS — Z01818 Encounter for other preprocedural examination: Secondary | ICD-10-CM | POA: Diagnosis not present

## 2017-05-08 DIAGNOSIS — Z7682 Awaiting organ transplant status: Secondary | ICD-10-CM | POA: Diagnosis not present

## 2017-05-08 DIAGNOSIS — Z940 Kidney transplant status: Secondary | ICD-10-CM | POA: Diagnosis not present

## 2017-05-08 DIAGNOSIS — N2581 Secondary hyperparathyroidism of renal origin: Secondary | ICD-10-CM | POA: Diagnosis not present

## 2017-05-08 DIAGNOSIS — E46 Unspecified protein-calorie malnutrition: Secondary | ICD-10-CM | POA: Diagnosis not present

## 2017-05-08 DIAGNOSIS — D509 Iron deficiency anemia, unspecified: Secondary | ICD-10-CM | POA: Diagnosis not present

## 2017-05-08 DIAGNOSIS — K210 Gastro-esophageal reflux disease with esophagitis: Secondary | ICD-10-CM | POA: Diagnosis not present

## 2017-05-08 DIAGNOSIS — R911 Solitary pulmonary nodule: Secondary | ICD-10-CM | POA: Diagnosis not present

## 2017-05-09 DIAGNOSIS — D509 Iron deficiency anemia, unspecified: Secondary | ICD-10-CM | POA: Diagnosis not present

## 2017-05-09 DIAGNOSIS — N2581 Secondary hyperparathyroidism of renal origin: Secondary | ICD-10-CM | POA: Diagnosis not present

## 2017-05-09 DIAGNOSIS — D631 Anemia in chronic kidney disease: Secondary | ICD-10-CM | POA: Diagnosis not present

## 2017-05-09 DIAGNOSIS — E46 Unspecified protein-calorie malnutrition: Secondary | ICD-10-CM | POA: Diagnosis not present

## 2017-05-09 DIAGNOSIS — N186 End stage renal disease: Secondary | ICD-10-CM | POA: Diagnosis not present

## 2017-05-09 DIAGNOSIS — N2589 Other disorders resulting from impaired renal tubular function: Secondary | ICD-10-CM | POA: Diagnosis not present

## 2017-05-10 DIAGNOSIS — D509 Iron deficiency anemia, unspecified: Secondary | ICD-10-CM | POA: Diagnosis not present

## 2017-05-10 DIAGNOSIS — D631 Anemia in chronic kidney disease: Secondary | ICD-10-CM | POA: Diagnosis not present

## 2017-05-10 DIAGNOSIS — N2589 Other disorders resulting from impaired renal tubular function: Secondary | ICD-10-CM | POA: Diagnosis not present

## 2017-05-10 DIAGNOSIS — N186 End stage renal disease: Secondary | ICD-10-CM | POA: Diagnosis not present

## 2017-05-10 DIAGNOSIS — N2581 Secondary hyperparathyroidism of renal origin: Secondary | ICD-10-CM | POA: Diagnosis not present

## 2017-05-10 DIAGNOSIS — E46 Unspecified protein-calorie malnutrition: Secondary | ICD-10-CM | POA: Diagnosis not present

## 2017-05-11 DIAGNOSIS — N186 End stage renal disease: Secondary | ICD-10-CM | POA: Diagnosis not present

## 2017-05-11 DIAGNOSIS — D631 Anemia in chronic kidney disease: Secondary | ICD-10-CM | POA: Diagnosis not present

## 2017-05-11 DIAGNOSIS — N2581 Secondary hyperparathyroidism of renal origin: Secondary | ICD-10-CM | POA: Diagnosis not present

## 2017-05-11 DIAGNOSIS — E46 Unspecified protein-calorie malnutrition: Secondary | ICD-10-CM | POA: Diagnosis not present

## 2017-05-11 DIAGNOSIS — N2589 Other disorders resulting from impaired renal tubular function: Secondary | ICD-10-CM | POA: Diagnosis not present

## 2017-05-11 DIAGNOSIS — D509 Iron deficiency anemia, unspecified: Secondary | ICD-10-CM | POA: Diagnosis not present

## 2017-05-12 DIAGNOSIS — D509 Iron deficiency anemia, unspecified: Secondary | ICD-10-CM | POA: Diagnosis not present

## 2017-05-12 DIAGNOSIS — N2589 Other disorders resulting from impaired renal tubular function: Secondary | ICD-10-CM | POA: Diagnosis not present

## 2017-05-12 DIAGNOSIS — E46 Unspecified protein-calorie malnutrition: Secondary | ICD-10-CM | POA: Diagnosis not present

## 2017-05-12 DIAGNOSIS — N2581 Secondary hyperparathyroidism of renal origin: Secondary | ICD-10-CM | POA: Diagnosis not present

## 2017-05-12 DIAGNOSIS — D631 Anemia in chronic kidney disease: Secondary | ICD-10-CM | POA: Diagnosis not present

## 2017-05-12 DIAGNOSIS — N186 End stage renal disease: Secondary | ICD-10-CM | POA: Diagnosis not present

## 2017-05-13 DIAGNOSIS — E46 Unspecified protein-calorie malnutrition: Secondary | ICD-10-CM | POA: Diagnosis not present

## 2017-05-13 DIAGNOSIS — N186 End stage renal disease: Secondary | ICD-10-CM | POA: Diagnosis not present

## 2017-05-13 DIAGNOSIS — D631 Anemia in chronic kidney disease: Secondary | ICD-10-CM | POA: Diagnosis not present

## 2017-05-13 DIAGNOSIS — D509 Iron deficiency anemia, unspecified: Secondary | ICD-10-CM | POA: Diagnosis not present

## 2017-05-13 DIAGNOSIS — N2581 Secondary hyperparathyroidism of renal origin: Secondary | ICD-10-CM | POA: Diagnosis not present

## 2017-05-13 DIAGNOSIS — N2589 Other disorders resulting from impaired renal tubular function: Secondary | ICD-10-CM | POA: Diagnosis not present

## 2017-05-14 DIAGNOSIS — D631 Anemia in chronic kidney disease: Secondary | ICD-10-CM | POA: Diagnosis not present

## 2017-05-14 DIAGNOSIS — D509 Iron deficiency anemia, unspecified: Secondary | ICD-10-CM | POA: Diagnosis not present

## 2017-05-14 DIAGNOSIS — N2589 Other disorders resulting from impaired renal tubular function: Secondary | ICD-10-CM | POA: Diagnosis not present

## 2017-05-14 DIAGNOSIS — E46 Unspecified protein-calorie malnutrition: Secondary | ICD-10-CM | POA: Diagnosis not present

## 2017-05-14 DIAGNOSIS — N186 End stage renal disease: Secondary | ICD-10-CM | POA: Diagnosis not present

## 2017-05-14 DIAGNOSIS — N2581 Secondary hyperparathyroidism of renal origin: Secondary | ICD-10-CM | POA: Diagnosis not present

## 2017-05-15 DIAGNOSIS — E46 Unspecified protein-calorie malnutrition: Secondary | ICD-10-CM | POA: Diagnosis not present

## 2017-05-15 DIAGNOSIS — N2589 Other disorders resulting from impaired renal tubular function: Secondary | ICD-10-CM | POA: Diagnosis not present

## 2017-05-15 DIAGNOSIS — N186 End stage renal disease: Secondary | ICD-10-CM | POA: Diagnosis not present

## 2017-05-15 DIAGNOSIS — D631 Anemia in chronic kidney disease: Secondary | ICD-10-CM | POA: Diagnosis not present

## 2017-05-15 DIAGNOSIS — N2581 Secondary hyperparathyroidism of renal origin: Secondary | ICD-10-CM | POA: Diagnosis not present

## 2017-05-15 DIAGNOSIS — D509 Iron deficiency anemia, unspecified: Secondary | ICD-10-CM | POA: Diagnosis not present

## 2017-05-16 DIAGNOSIS — N2589 Other disorders resulting from impaired renal tubular function: Secondary | ICD-10-CM | POA: Diagnosis not present

## 2017-05-16 DIAGNOSIS — N2581 Secondary hyperparathyroidism of renal origin: Secondary | ICD-10-CM | POA: Diagnosis not present

## 2017-05-16 DIAGNOSIS — D631 Anemia in chronic kidney disease: Secondary | ICD-10-CM | POA: Diagnosis not present

## 2017-05-16 DIAGNOSIS — J45909 Unspecified asthma, uncomplicated: Secondary | ICD-10-CM | POA: Diagnosis not present

## 2017-05-16 DIAGNOSIS — E46 Unspecified protein-calorie malnutrition: Secondary | ICD-10-CM | POA: Diagnosis not present

## 2017-05-16 DIAGNOSIS — Z8709 Personal history of other diseases of the respiratory system: Secondary | ICD-10-CM | POA: Diagnosis not present

## 2017-05-16 DIAGNOSIS — D509 Iron deficiency anemia, unspecified: Secondary | ICD-10-CM | POA: Diagnosis not present

## 2017-05-16 DIAGNOSIS — R918 Other nonspecific abnormal finding of lung field: Secondary | ICD-10-CM | POA: Diagnosis not present

## 2017-05-16 DIAGNOSIS — N186 End stage renal disease: Secondary | ICD-10-CM | POA: Diagnosis not present

## 2017-05-16 DIAGNOSIS — R05 Cough: Secondary | ICD-10-CM | POA: Diagnosis not present

## 2017-05-17 DIAGNOSIS — D631 Anemia in chronic kidney disease: Secondary | ICD-10-CM | POA: Diagnosis not present

## 2017-05-17 DIAGNOSIS — N2589 Other disorders resulting from impaired renal tubular function: Secondary | ICD-10-CM | POA: Diagnosis not present

## 2017-05-17 DIAGNOSIS — N186 End stage renal disease: Secondary | ICD-10-CM | POA: Diagnosis not present

## 2017-05-17 DIAGNOSIS — N2581 Secondary hyperparathyroidism of renal origin: Secondary | ICD-10-CM | POA: Diagnosis not present

## 2017-05-17 DIAGNOSIS — D509 Iron deficiency anemia, unspecified: Secondary | ICD-10-CM | POA: Diagnosis not present

## 2017-05-17 DIAGNOSIS — E46 Unspecified protein-calorie malnutrition: Secondary | ICD-10-CM | POA: Diagnosis not present

## 2017-05-18 DIAGNOSIS — E46 Unspecified protein-calorie malnutrition: Secondary | ICD-10-CM | POA: Diagnosis not present

## 2017-05-18 DIAGNOSIS — D509 Iron deficiency anemia, unspecified: Secondary | ICD-10-CM | POA: Diagnosis not present

## 2017-05-18 DIAGNOSIS — D631 Anemia in chronic kidney disease: Secondary | ICD-10-CM | POA: Diagnosis not present

## 2017-05-18 DIAGNOSIS — N2589 Other disorders resulting from impaired renal tubular function: Secondary | ICD-10-CM | POA: Diagnosis not present

## 2017-05-18 DIAGNOSIS — N2581 Secondary hyperparathyroidism of renal origin: Secondary | ICD-10-CM | POA: Diagnosis not present

## 2017-05-18 DIAGNOSIS — N186 End stage renal disease: Secondary | ICD-10-CM | POA: Diagnosis not present

## 2017-05-19 DIAGNOSIS — N186 End stage renal disease: Secondary | ICD-10-CM | POA: Diagnosis not present

## 2017-05-19 DIAGNOSIS — D631 Anemia in chronic kidney disease: Secondary | ICD-10-CM | POA: Diagnosis not present

## 2017-05-19 DIAGNOSIS — N2581 Secondary hyperparathyroidism of renal origin: Secondary | ICD-10-CM | POA: Diagnosis not present

## 2017-05-19 DIAGNOSIS — N2589 Other disorders resulting from impaired renal tubular function: Secondary | ICD-10-CM | POA: Diagnosis not present

## 2017-05-19 DIAGNOSIS — E46 Unspecified protein-calorie malnutrition: Secondary | ICD-10-CM | POA: Diagnosis not present

## 2017-05-19 DIAGNOSIS — D509 Iron deficiency anemia, unspecified: Secondary | ICD-10-CM | POA: Diagnosis not present

## 2017-05-20 DIAGNOSIS — N186 End stage renal disease: Secondary | ICD-10-CM | POA: Diagnosis not present

## 2017-05-20 DIAGNOSIS — N2581 Secondary hyperparathyroidism of renal origin: Secondary | ICD-10-CM | POA: Diagnosis not present

## 2017-05-20 DIAGNOSIS — D509 Iron deficiency anemia, unspecified: Secondary | ICD-10-CM | POA: Diagnosis not present

## 2017-05-20 DIAGNOSIS — N2589 Other disorders resulting from impaired renal tubular function: Secondary | ICD-10-CM | POA: Diagnosis not present

## 2017-05-20 DIAGNOSIS — E46 Unspecified protein-calorie malnutrition: Secondary | ICD-10-CM | POA: Diagnosis not present

## 2017-05-20 DIAGNOSIS — D631 Anemia in chronic kidney disease: Secondary | ICD-10-CM | POA: Diagnosis not present

## 2017-05-21 DIAGNOSIS — E46 Unspecified protein-calorie malnutrition: Secondary | ICD-10-CM | POA: Diagnosis not present

## 2017-05-21 DIAGNOSIS — N2581 Secondary hyperparathyroidism of renal origin: Secondary | ICD-10-CM | POA: Diagnosis not present

## 2017-05-21 DIAGNOSIS — N2589 Other disorders resulting from impaired renal tubular function: Secondary | ICD-10-CM | POA: Diagnosis not present

## 2017-05-21 DIAGNOSIS — D631 Anemia in chronic kidney disease: Secondary | ICD-10-CM | POA: Diagnosis not present

## 2017-05-21 DIAGNOSIS — D509 Iron deficiency anemia, unspecified: Secondary | ICD-10-CM | POA: Diagnosis not present

## 2017-05-21 DIAGNOSIS — N186 End stage renal disease: Secondary | ICD-10-CM | POA: Diagnosis not present

## 2017-05-22 DIAGNOSIS — D631 Anemia in chronic kidney disease: Secondary | ICD-10-CM | POA: Diagnosis not present

## 2017-05-22 DIAGNOSIS — D509 Iron deficiency anemia, unspecified: Secondary | ICD-10-CM | POA: Diagnosis not present

## 2017-05-22 DIAGNOSIS — E46 Unspecified protein-calorie malnutrition: Secondary | ICD-10-CM | POA: Diagnosis not present

## 2017-05-22 DIAGNOSIS — N2589 Other disorders resulting from impaired renal tubular function: Secondary | ICD-10-CM | POA: Diagnosis not present

## 2017-05-22 DIAGNOSIS — N186 End stage renal disease: Secondary | ICD-10-CM | POA: Diagnosis not present

## 2017-05-22 DIAGNOSIS — N2581 Secondary hyperparathyroidism of renal origin: Secondary | ICD-10-CM | POA: Diagnosis not present

## 2017-05-23 DIAGNOSIS — D509 Iron deficiency anemia, unspecified: Secondary | ICD-10-CM | POA: Diagnosis not present

## 2017-05-23 DIAGNOSIS — D631 Anemia in chronic kidney disease: Secondary | ICD-10-CM | POA: Diagnosis not present

## 2017-05-23 DIAGNOSIS — E46 Unspecified protein-calorie malnutrition: Secondary | ICD-10-CM | POA: Diagnosis not present

## 2017-05-23 DIAGNOSIS — N2581 Secondary hyperparathyroidism of renal origin: Secondary | ICD-10-CM | POA: Diagnosis not present

## 2017-05-23 DIAGNOSIS — N186 End stage renal disease: Secondary | ICD-10-CM | POA: Diagnosis not present

## 2017-05-23 DIAGNOSIS — N2589 Other disorders resulting from impaired renal tubular function: Secondary | ICD-10-CM | POA: Diagnosis not present

## 2017-05-24 DIAGNOSIS — Z79899 Other long term (current) drug therapy: Secondary | ICD-10-CM | POA: Diagnosis not present

## 2017-05-24 DIAGNOSIS — N2589 Other disorders resulting from impaired renal tubular function: Secondary | ICD-10-CM | POA: Diagnosis not present

## 2017-05-24 DIAGNOSIS — E46 Unspecified protein-calorie malnutrition: Secondary | ICD-10-CM | POA: Diagnosis not present

## 2017-05-24 DIAGNOSIS — N186 End stage renal disease: Secondary | ICD-10-CM | POA: Diagnosis not present

## 2017-05-24 DIAGNOSIS — K769 Liver disease, unspecified: Secondary | ICD-10-CM | POA: Diagnosis not present

## 2017-05-24 DIAGNOSIS — D509 Iron deficiency anemia, unspecified: Secondary | ICD-10-CM | POA: Diagnosis not present

## 2017-05-24 DIAGNOSIS — D631 Anemia in chronic kidney disease: Secondary | ICD-10-CM | POA: Diagnosis not present

## 2017-05-24 DIAGNOSIS — N2581 Secondary hyperparathyroidism of renal origin: Secondary | ICD-10-CM | POA: Diagnosis not present

## 2017-05-25 DIAGNOSIS — N2581 Secondary hyperparathyroidism of renal origin: Secondary | ICD-10-CM | POA: Diagnosis not present

## 2017-05-25 DIAGNOSIS — N186 End stage renal disease: Secondary | ICD-10-CM | POA: Diagnosis not present

## 2017-05-25 DIAGNOSIS — Z79899 Other long term (current) drug therapy: Secondary | ICD-10-CM | POA: Diagnosis not present

## 2017-05-25 DIAGNOSIS — D631 Anemia in chronic kidney disease: Secondary | ICD-10-CM | POA: Diagnosis not present

## 2017-05-25 DIAGNOSIS — D509 Iron deficiency anemia, unspecified: Secondary | ICD-10-CM | POA: Diagnosis not present

## 2017-05-25 DIAGNOSIS — E46 Unspecified protein-calorie malnutrition: Secondary | ICD-10-CM | POA: Diagnosis not present

## 2017-05-26 DIAGNOSIS — Z79899 Other long term (current) drug therapy: Secondary | ICD-10-CM | POA: Diagnosis not present

## 2017-05-26 DIAGNOSIS — N2581 Secondary hyperparathyroidism of renal origin: Secondary | ICD-10-CM | POA: Diagnosis not present

## 2017-05-26 DIAGNOSIS — E46 Unspecified protein-calorie malnutrition: Secondary | ICD-10-CM | POA: Diagnosis not present

## 2017-05-26 DIAGNOSIS — N186 End stage renal disease: Secondary | ICD-10-CM | POA: Diagnosis not present

## 2017-05-26 DIAGNOSIS — D631 Anemia in chronic kidney disease: Secondary | ICD-10-CM | POA: Diagnosis not present

## 2017-05-26 DIAGNOSIS — D509 Iron deficiency anemia, unspecified: Secondary | ICD-10-CM | POA: Diagnosis not present

## 2017-05-27 DIAGNOSIS — D631 Anemia in chronic kidney disease: Secondary | ICD-10-CM | POA: Diagnosis not present

## 2017-05-27 DIAGNOSIS — Z79899 Other long term (current) drug therapy: Secondary | ICD-10-CM | POA: Diagnosis not present

## 2017-05-27 DIAGNOSIS — E46 Unspecified protein-calorie malnutrition: Secondary | ICD-10-CM | POA: Diagnosis not present

## 2017-05-27 DIAGNOSIS — D509 Iron deficiency anemia, unspecified: Secondary | ICD-10-CM | POA: Diagnosis not present

## 2017-05-27 DIAGNOSIS — N2581 Secondary hyperparathyroidism of renal origin: Secondary | ICD-10-CM | POA: Diagnosis not present

## 2017-05-27 DIAGNOSIS — N186 End stage renal disease: Secondary | ICD-10-CM | POA: Diagnosis not present

## 2017-05-27 DIAGNOSIS — R05 Cough: Secondary | ICD-10-CM | POA: Diagnosis not present

## 2017-05-28 DIAGNOSIS — D631 Anemia in chronic kidney disease: Secondary | ICD-10-CM | POA: Diagnosis not present

## 2017-05-28 DIAGNOSIS — N186 End stage renal disease: Secondary | ICD-10-CM | POA: Diagnosis not present

## 2017-05-28 DIAGNOSIS — F4320 Adjustment disorder, unspecified: Secondary | ICD-10-CM | POA: Diagnosis not present

## 2017-05-28 DIAGNOSIS — N2581 Secondary hyperparathyroidism of renal origin: Secondary | ICD-10-CM | POA: Diagnosis not present

## 2017-05-28 DIAGNOSIS — E46 Unspecified protein-calorie malnutrition: Secondary | ICD-10-CM | POA: Diagnosis not present

## 2017-05-28 DIAGNOSIS — D509 Iron deficiency anemia, unspecified: Secondary | ICD-10-CM | POA: Diagnosis not present

## 2017-05-28 DIAGNOSIS — R8299 Other abnormal findings in urine: Secondary | ICD-10-CM | POA: Diagnosis not present

## 2017-05-28 DIAGNOSIS — Z79899 Other long term (current) drug therapy: Secondary | ICD-10-CM | POA: Diagnosis not present

## 2017-05-29 DIAGNOSIS — D509 Iron deficiency anemia, unspecified: Secondary | ICD-10-CM | POA: Diagnosis not present

## 2017-05-29 DIAGNOSIS — N2581 Secondary hyperparathyroidism of renal origin: Secondary | ICD-10-CM | POA: Diagnosis not present

## 2017-05-29 DIAGNOSIS — D631 Anemia in chronic kidney disease: Secondary | ICD-10-CM | POA: Diagnosis not present

## 2017-05-29 DIAGNOSIS — E46 Unspecified protein-calorie malnutrition: Secondary | ICD-10-CM | POA: Diagnosis not present

## 2017-05-29 DIAGNOSIS — Z79899 Other long term (current) drug therapy: Secondary | ICD-10-CM | POA: Diagnosis not present

## 2017-05-29 DIAGNOSIS — N186 End stage renal disease: Secondary | ICD-10-CM | POA: Diagnosis not present

## 2017-05-30 DIAGNOSIS — N2581 Secondary hyperparathyroidism of renal origin: Secondary | ICD-10-CM | POA: Diagnosis not present

## 2017-05-30 DIAGNOSIS — E46 Unspecified protein-calorie malnutrition: Secondary | ICD-10-CM | POA: Diagnosis not present

## 2017-05-30 DIAGNOSIS — D631 Anemia in chronic kidney disease: Secondary | ICD-10-CM | POA: Diagnosis not present

## 2017-05-30 DIAGNOSIS — N186 End stage renal disease: Secondary | ICD-10-CM | POA: Diagnosis not present

## 2017-05-30 DIAGNOSIS — Z79899 Other long term (current) drug therapy: Secondary | ICD-10-CM | POA: Diagnosis not present

## 2017-05-30 DIAGNOSIS — D509 Iron deficiency anemia, unspecified: Secondary | ICD-10-CM | POA: Diagnosis not present

## 2017-05-31 DIAGNOSIS — E46 Unspecified protein-calorie malnutrition: Secondary | ICD-10-CM | POA: Diagnosis not present

## 2017-05-31 DIAGNOSIS — D631 Anemia in chronic kidney disease: Secondary | ICD-10-CM | POA: Diagnosis not present

## 2017-05-31 DIAGNOSIS — Z79899 Other long term (current) drug therapy: Secondary | ICD-10-CM | POA: Diagnosis not present

## 2017-05-31 DIAGNOSIS — D509 Iron deficiency anemia, unspecified: Secondary | ICD-10-CM | POA: Diagnosis not present

## 2017-05-31 DIAGNOSIS — N186 End stage renal disease: Secondary | ICD-10-CM | POA: Diagnosis not present

## 2017-05-31 DIAGNOSIS — N2581 Secondary hyperparathyroidism of renal origin: Secondary | ICD-10-CM | POA: Diagnosis not present

## 2017-06-01 DIAGNOSIS — E46 Unspecified protein-calorie malnutrition: Secondary | ICD-10-CM | POA: Diagnosis not present

## 2017-06-01 DIAGNOSIS — D631 Anemia in chronic kidney disease: Secondary | ICD-10-CM | POA: Diagnosis not present

## 2017-06-01 DIAGNOSIS — Z79899 Other long term (current) drug therapy: Secondary | ICD-10-CM | POA: Diagnosis not present

## 2017-06-01 DIAGNOSIS — N186 End stage renal disease: Secondary | ICD-10-CM | POA: Diagnosis not present

## 2017-06-01 DIAGNOSIS — N2581 Secondary hyperparathyroidism of renal origin: Secondary | ICD-10-CM | POA: Diagnosis not present

## 2017-06-01 DIAGNOSIS — D509 Iron deficiency anemia, unspecified: Secondary | ICD-10-CM | POA: Diagnosis not present

## 2017-06-02 DIAGNOSIS — Z79899 Other long term (current) drug therapy: Secondary | ICD-10-CM | POA: Diagnosis not present

## 2017-06-02 DIAGNOSIS — E46 Unspecified protein-calorie malnutrition: Secondary | ICD-10-CM | POA: Diagnosis not present

## 2017-06-02 DIAGNOSIS — N2581 Secondary hyperparathyroidism of renal origin: Secondary | ICD-10-CM | POA: Diagnosis not present

## 2017-06-02 DIAGNOSIS — D509 Iron deficiency anemia, unspecified: Secondary | ICD-10-CM | POA: Diagnosis not present

## 2017-06-02 DIAGNOSIS — D631 Anemia in chronic kidney disease: Secondary | ICD-10-CM | POA: Diagnosis not present

## 2017-06-02 DIAGNOSIS — N186 End stage renal disease: Secondary | ICD-10-CM | POA: Diagnosis not present

## 2017-06-03 DIAGNOSIS — N186 End stage renal disease: Secondary | ICD-10-CM | POA: Diagnosis not present

## 2017-06-03 DIAGNOSIS — D509 Iron deficiency anemia, unspecified: Secondary | ICD-10-CM | POA: Diagnosis not present

## 2017-06-03 DIAGNOSIS — D631 Anemia in chronic kidney disease: Secondary | ICD-10-CM | POA: Diagnosis not present

## 2017-06-03 DIAGNOSIS — E46 Unspecified protein-calorie malnutrition: Secondary | ICD-10-CM | POA: Diagnosis not present

## 2017-06-03 DIAGNOSIS — Z79899 Other long term (current) drug therapy: Secondary | ICD-10-CM | POA: Diagnosis not present

## 2017-06-03 DIAGNOSIS — N2581 Secondary hyperparathyroidism of renal origin: Secondary | ICD-10-CM | POA: Diagnosis not present

## 2017-06-04 DIAGNOSIS — N186 End stage renal disease: Secondary | ICD-10-CM | POA: Diagnosis not present

## 2017-06-04 DIAGNOSIS — E46 Unspecified protein-calorie malnutrition: Secondary | ICD-10-CM | POA: Diagnosis not present

## 2017-06-04 DIAGNOSIS — Z79899 Other long term (current) drug therapy: Secondary | ICD-10-CM | POA: Diagnosis not present

## 2017-06-04 DIAGNOSIS — D509 Iron deficiency anemia, unspecified: Secondary | ICD-10-CM | POA: Diagnosis not present

## 2017-06-04 DIAGNOSIS — D631 Anemia in chronic kidney disease: Secondary | ICD-10-CM | POA: Diagnosis not present

## 2017-06-04 DIAGNOSIS — N2581 Secondary hyperparathyroidism of renal origin: Secondary | ICD-10-CM | POA: Diagnosis not present

## 2017-06-05 DIAGNOSIS — N186 End stage renal disease: Secondary | ICD-10-CM | POA: Diagnosis not present

## 2017-06-05 DIAGNOSIS — D509 Iron deficiency anemia, unspecified: Secondary | ICD-10-CM | POA: Diagnosis not present

## 2017-06-05 DIAGNOSIS — Z79899 Other long term (current) drug therapy: Secondary | ICD-10-CM | POA: Diagnosis not present

## 2017-06-05 DIAGNOSIS — N2581 Secondary hyperparathyroidism of renal origin: Secondary | ICD-10-CM | POA: Diagnosis not present

## 2017-06-05 DIAGNOSIS — D631 Anemia in chronic kidney disease: Secondary | ICD-10-CM | POA: Diagnosis not present

## 2017-06-05 DIAGNOSIS — E46 Unspecified protein-calorie malnutrition: Secondary | ICD-10-CM | POA: Diagnosis not present

## 2017-06-06 DIAGNOSIS — N2581 Secondary hyperparathyroidism of renal origin: Secondary | ICD-10-CM | POA: Diagnosis not present

## 2017-06-06 DIAGNOSIS — E46 Unspecified protein-calorie malnutrition: Secondary | ICD-10-CM | POA: Diagnosis not present

## 2017-06-06 DIAGNOSIS — Z79899 Other long term (current) drug therapy: Secondary | ICD-10-CM | POA: Diagnosis not present

## 2017-06-06 DIAGNOSIS — N186 End stage renal disease: Secondary | ICD-10-CM | POA: Diagnosis not present

## 2017-06-06 DIAGNOSIS — D631 Anemia in chronic kidney disease: Secondary | ICD-10-CM | POA: Diagnosis not present

## 2017-06-06 DIAGNOSIS — D509 Iron deficiency anemia, unspecified: Secondary | ICD-10-CM | POA: Diagnosis not present

## 2017-06-07 DIAGNOSIS — E46 Unspecified protein-calorie malnutrition: Secondary | ICD-10-CM | POA: Diagnosis not present

## 2017-06-07 DIAGNOSIS — Z79899 Other long term (current) drug therapy: Secondary | ICD-10-CM | POA: Diagnosis not present

## 2017-06-07 DIAGNOSIS — N2581 Secondary hyperparathyroidism of renal origin: Secondary | ICD-10-CM | POA: Diagnosis not present

## 2017-06-07 DIAGNOSIS — N186 End stage renal disease: Secondary | ICD-10-CM | POA: Diagnosis not present

## 2017-06-07 DIAGNOSIS — D509 Iron deficiency anemia, unspecified: Secondary | ICD-10-CM | POA: Diagnosis not present

## 2017-06-07 DIAGNOSIS — D631 Anemia in chronic kidney disease: Secondary | ICD-10-CM | POA: Diagnosis not present

## 2017-06-08 DIAGNOSIS — N186 End stage renal disease: Secondary | ICD-10-CM | POA: Diagnosis not present

## 2017-06-08 DIAGNOSIS — D631 Anemia in chronic kidney disease: Secondary | ICD-10-CM | POA: Diagnosis not present

## 2017-06-08 DIAGNOSIS — E46 Unspecified protein-calorie malnutrition: Secondary | ICD-10-CM | POA: Diagnosis not present

## 2017-06-08 DIAGNOSIS — Z79899 Other long term (current) drug therapy: Secondary | ICD-10-CM | POA: Diagnosis not present

## 2017-06-08 DIAGNOSIS — N2581 Secondary hyperparathyroidism of renal origin: Secondary | ICD-10-CM | POA: Diagnosis not present

## 2017-06-08 DIAGNOSIS — D509 Iron deficiency anemia, unspecified: Secondary | ICD-10-CM | POA: Diagnosis not present

## 2017-06-09 DIAGNOSIS — Z79899 Other long term (current) drug therapy: Secondary | ICD-10-CM | POA: Diagnosis not present

## 2017-06-09 DIAGNOSIS — E46 Unspecified protein-calorie malnutrition: Secondary | ICD-10-CM | POA: Diagnosis not present

## 2017-06-09 DIAGNOSIS — N2581 Secondary hyperparathyroidism of renal origin: Secondary | ICD-10-CM | POA: Diagnosis not present

## 2017-06-09 DIAGNOSIS — D631 Anemia in chronic kidney disease: Secondary | ICD-10-CM | POA: Diagnosis not present

## 2017-06-09 DIAGNOSIS — N186 End stage renal disease: Secondary | ICD-10-CM | POA: Diagnosis not present

## 2017-06-09 DIAGNOSIS — D509 Iron deficiency anemia, unspecified: Secondary | ICD-10-CM | POA: Diagnosis not present

## 2017-06-10 DIAGNOSIS — E46 Unspecified protein-calorie malnutrition: Secondary | ICD-10-CM | POA: Diagnosis not present

## 2017-06-10 DIAGNOSIS — D509 Iron deficiency anemia, unspecified: Secondary | ICD-10-CM | POA: Diagnosis not present

## 2017-06-10 DIAGNOSIS — N2581 Secondary hyperparathyroidism of renal origin: Secondary | ICD-10-CM | POA: Diagnosis not present

## 2017-06-10 DIAGNOSIS — N186 End stage renal disease: Secondary | ICD-10-CM | POA: Diagnosis not present

## 2017-06-10 DIAGNOSIS — Z79899 Other long term (current) drug therapy: Secondary | ICD-10-CM | POA: Diagnosis not present

## 2017-06-10 DIAGNOSIS — D631 Anemia in chronic kidney disease: Secondary | ICD-10-CM | POA: Diagnosis not present

## 2017-06-11 DIAGNOSIS — F4320 Adjustment disorder, unspecified: Secondary | ICD-10-CM | POA: Diagnosis not present

## 2017-06-11 DIAGNOSIS — E46 Unspecified protein-calorie malnutrition: Secondary | ICD-10-CM | POA: Diagnosis not present

## 2017-06-11 DIAGNOSIS — N2581 Secondary hyperparathyroidism of renal origin: Secondary | ICD-10-CM | POA: Diagnosis not present

## 2017-06-11 DIAGNOSIS — D509 Iron deficiency anemia, unspecified: Secondary | ICD-10-CM | POA: Diagnosis not present

## 2017-06-11 DIAGNOSIS — D631 Anemia in chronic kidney disease: Secondary | ICD-10-CM | POA: Diagnosis not present

## 2017-06-11 DIAGNOSIS — N186 End stage renal disease: Secondary | ICD-10-CM | POA: Diagnosis not present

## 2017-06-11 DIAGNOSIS — Z79899 Other long term (current) drug therapy: Secondary | ICD-10-CM | POA: Diagnosis not present

## 2017-06-12 DIAGNOSIS — N2581 Secondary hyperparathyroidism of renal origin: Secondary | ICD-10-CM | POA: Diagnosis not present

## 2017-06-12 DIAGNOSIS — Z79899 Other long term (current) drug therapy: Secondary | ICD-10-CM | POA: Diagnosis not present

## 2017-06-12 DIAGNOSIS — D631 Anemia in chronic kidney disease: Secondary | ICD-10-CM | POA: Diagnosis not present

## 2017-06-12 DIAGNOSIS — N186 End stage renal disease: Secondary | ICD-10-CM | POA: Diagnosis not present

## 2017-06-12 DIAGNOSIS — D509 Iron deficiency anemia, unspecified: Secondary | ICD-10-CM | POA: Diagnosis not present

## 2017-06-12 DIAGNOSIS — E46 Unspecified protein-calorie malnutrition: Secondary | ICD-10-CM | POA: Diagnosis not present

## 2017-06-13 DIAGNOSIS — N186 End stage renal disease: Secondary | ICD-10-CM | POA: Diagnosis not present

## 2017-06-13 DIAGNOSIS — D509 Iron deficiency anemia, unspecified: Secondary | ICD-10-CM | POA: Diagnosis not present

## 2017-06-13 DIAGNOSIS — E46 Unspecified protein-calorie malnutrition: Secondary | ICD-10-CM | POA: Diagnosis not present

## 2017-06-13 DIAGNOSIS — D631 Anemia in chronic kidney disease: Secondary | ICD-10-CM | POA: Diagnosis not present

## 2017-06-13 DIAGNOSIS — Z79899 Other long term (current) drug therapy: Secondary | ICD-10-CM | POA: Diagnosis not present

## 2017-06-13 DIAGNOSIS — N2581 Secondary hyperparathyroidism of renal origin: Secondary | ICD-10-CM | POA: Diagnosis not present

## 2017-06-14 DIAGNOSIS — Z79899 Other long term (current) drug therapy: Secondary | ICD-10-CM | POA: Diagnosis not present

## 2017-06-14 DIAGNOSIS — E46 Unspecified protein-calorie malnutrition: Secondary | ICD-10-CM | POA: Diagnosis not present

## 2017-06-14 DIAGNOSIS — N2581 Secondary hyperparathyroidism of renal origin: Secondary | ICD-10-CM | POA: Diagnosis not present

## 2017-06-14 DIAGNOSIS — D631 Anemia in chronic kidney disease: Secondary | ICD-10-CM | POA: Diagnosis not present

## 2017-06-14 DIAGNOSIS — N186 End stage renal disease: Secondary | ICD-10-CM | POA: Diagnosis not present

## 2017-06-14 DIAGNOSIS — D509 Iron deficiency anemia, unspecified: Secondary | ICD-10-CM | POA: Diagnosis not present

## 2017-06-15 DIAGNOSIS — N186 End stage renal disease: Secondary | ICD-10-CM | POA: Diagnosis not present

## 2017-06-15 DIAGNOSIS — Z79899 Other long term (current) drug therapy: Secondary | ICD-10-CM | POA: Diagnosis not present

## 2017-06-15 DIAGNOSIS — E46 Unspecified protein-calorie malnutrition: Secondary | ICD-10-CM | POA: Diagnosis not present

## 2017-06-15 DIAGNOSIS — N2581 Secondary hyperparathyroidism of renal origin: Secondary | ICD-10-CM | POA: Diagnosis not present

## 2017-06-15 DIAGNOSIS — D631 Anemia in chronic kidney disease: Secondary | ICD-10-CM | POA: Diagnosis not present

## 2017-06-15 DIAGNOSIS — D509 Iron deficiency anemia, unspecified: Secondary | ICD-10-CM | POA: Diagnosis not present

## 2017-06-16 DIAGNOSIS — D509 Iron deficiency anemia, unspecified: Secondary | ICD-10-CM | POA: Diagnosis not present

## 2017-06-16 DIAGNOSIS — Z79899 Other long term (current) drug therapy: Secondary | ICD-10-CM | POA: Diagnosis not present

## 2017-06-16 DIAGNOSIS — N186 End stage renal disease: Secondary | ICD-10-CM | POA: Diagnosis not present

## 2017-06-16 DIAGNOSIS — E46 Unspecified protein-calorie malnutrition: Secondary | ICD-10-CM | POA: Diagnosis not present

## 2017-06-16 DIAGNOSIS — N2581 Secondary hyperparathyroidism of renal origin: Secondary | ICD-10-CM | POA: Diagnosis not present

## 2017-06-16 DIAGNOSIS — D631 Anemia in chronic kidney disease: Secondary | ICD-10-CM | POA: Diagnosis not present

## 2017-06-17 DIAGNOSIS — E46 Unspecified protein-calorie malnutrition: Secondary | ICD-10-CM | POA: Diagnosis not present

## 2017-06-17 DIAGNOSIS — Z79899 Other long term (current) drug therapy: Secondary | ICD-10-CM | POA: Diagnosis not present

## 2017-06-17 DIAGNOSIS — N186 End stage renal disease: Secondary | ICD-10-CM | POA: Diagnosis not present

## 2017-06-17 DIAGNOSIS — D631 Anemia in chronic kidney disease: Secondary | ICD-10-CM | POA: Diagnosis not present

## 2017-06-17 DIAGNOSIS — N2581 Secondary hyperparathyroidism of renal origin: Secondary | ICD-10-CM | POA: Diagnosis not present

## 2017-06-17 DIAGNOSIS — D509 Iron deficiency anemia, unspecified: Secondary | ICD-10-CM | POA: Diagnosis not present

## 2017-06-18 DIAGNOSIS — E46 Unspecified protein-calorie malnutrition: Secondary | ICD-10-CM | POA: Diagnosis not present

## 2017-06-18 DIAGNOSIS — Z79899 Other long term (current) drug therapy: Secondary | ICD-10-CM | POA: Diagnosis not present

## 2017-06-18 DIAGNOSIS — D631 Anemia in chronic kidney disease: Secondary | ICD-10-CM | POA: Diagnosis not present

## 2017-06-18 DIAGNOSIS — N186 End stage renal disease: Secondary | ICD-10-CM | POA: Diagnosis not present

## 2017-06-18 DIAGNOSIS — D509 Iron deficiency anemia, unspecified: Secondary | ICD-10-CM | POA: Diagnosis not present

## 2017-06-18 DIAGNOSIS — N2581 Secondary hyperparathyroidism of renal origin: Secondary | ICD-10-CM | POA: Diagnosis not present

## 2017-06-19 DIAGNOSIS — D509 Iron deficiency anemia, unspecified: Secondary | ICD-10-CM | POA: Diagnosis not present

## 2017-06-19 DIAGNOSIS — Z79899 Other long term (current) drug therapy: Secondary | ICD-10-CM | POA: Diagnosis not present

## 2017-06-19 DIAGNOSIS — N2581 Secondary hyperparathyroidism of renal origin: Secondary | ICD-10-CM | POA: Diagnosis not present

## 2017-06-19 DIAGNOSIS — N186 End stage renal disease: Secondary | ICD-10-CM | POA: Diagnosis not present

## 2017-06-19 DIAGNOSIS — E46 Unspecified protein-calorie malnutrition: Secondary | ICD-10-CM | POA: Diagnosis not present

## 2017-06-19 DIAGNOSIS — D631 Anemia in chronic kidney disease: Secondary | ICD-10-CM | POA: Diagnosis not present

## 2017-06-20 DIAGNOSIS — E46 Unspecified protein-calorie malnutrition: Secondary | ICD-10-CM | POA: Diagnosis not present

## 2017-06-20 DIAGNOSIS — D509 Iron deficiency anemia, unspecified: Secondary | ICD-10-CM | POA: Diagnosis not present

## 2017-06-20 DIAGNOSIS — D631 Anemia in chronic kidney disease: Secondary | ICD-10-CM | POA: Diagnosis not present

## 2017-06-20 DIAGNOSIS — Z79899 Other long term (current) drug therapy: Secondary | ICD-10-CM | POA: Diagnosis not present

## 2017-06-20 DIAGNOSIS — N186 End stage renal disease: Secondary | ICD-10-CM | POA: Diagnosis not present

## 2017-06-20 DIAGNOSIS — N2581 Secondary hyperparathyroidism of renal origin: Secondary | ICD-10-CM | POA: Diagnosis not present

## 2017-06-21 DIAGNOSIS — D631 Anemia in chronic kidney disease: Secondary | ICD-10-CM | POA: Diagnosis not present

## 2017-06-21 DIAGNOSIS — D509 Iron deficiency anemia, unspecified: Secondary | ICD-10-CM | POA: Diagnosis not present

## 2017-06-21 DIAGNOSIS — E46 Unspecified protein-calorie malnutrition: Secondary | ICD-10-CM | POA: Diagnosis not present

## 2017-06-21 DIAGNOSIS — N2581 Secondary hyperparathyroidism of renal origin: Secondary | ICD-10-CM | POA: Diagnosis not present

## 2017-06-21 DIAGNOSIS — N186 End stage renal disease: Secondary | ICD-10-CM | POA: Diagnosis not present

## 2017-06-21 DIAGNOSIS — Z79899 Other long term (current) drug therapy: Secondary | ICD-10-CM | POA: Diagnosis not present

## 2017-06-22 DIAGNOSIS — D509 Iron deficiency anemia, unspecified: Secondary | ICD-10-CM | POA: Diagnosis not present

## 2017-06-22 DIAGNOSIS — N2581 Secondary hyperparathyroidism of renal origin: Secondary | ICD-10-CM | POA: Diagnosis not present

## 2017-06-22 DIAGNOSIS — N186 End stage renal disease: Secondary | ICD-10-CM | POA: Diagnosis not present

## 2017-06-22 DIAGNOSIS — E46 Unspecified protein-calorie malnutrition: Secondary | ICD-10-CM | POA: Diagnosis not present

## 2017-06-22 DIAGNOSIS — T8612 Kidney transplant failure: Secondary | ICD-10-CM | POA: Diagnosis not present

## 2017-06-22 DIAGNOSIS — D631 Anemia in chronic kidney disease: Secondary | ICD-10-CM | POA: Diagnosis not present

## 2017-06-22 DIAGNOSIS — Z992 Dependence on renal dialysis: Secondary | ICD-10-CM | POA: Diagnosis not present

## 2017-06-22 DIAGNOSIS — Z79899 Other long term (current) drug therapy: Secondary | ICD-10-CM | POA: Diagnosis not present

## 2017-06-23 DIAGNOSIS — Z23 Encounter for immunization: Secondary | ICD-10-CM | POA: Diagnosis not present

## 2017-06-23 DIAGNOSIS — D509 Iron deficiency anemia, unspecified: Secondary | ICD-10-CM | POA: Diagnosis not present

## 2017-06-23 DIAGNOSIS — N2581 Secondary hyperparathyroidism of renal origin: Secondary | ICD-10-CM | POA: Diagnosis not present

## 2017-06-23 DIAGNOSIS — Z4931 Encounter for adequacy testing for hemodialysis: Secondary | ICD-10-CM | POA: Diagnosis not present

## 2017-06-23 DIAGNOSIS — K769 Liver disease, unspecified: Secondary | ICD-10-CM | POA: Diagnosis not present

## 2017-06-23 DIAGNOSIS — D631 Anemia in chronic kidney disease: Secondary | ICD-10-CM | POA: Diagnosis not present

## 2017-06-23 DIAGNOSIS — N2589 Other disorders resulting from impaired renal tubular function: Secondary | ICD-10-CM | POA: Diagnosis not present

## 2017-06-23 DIAGNOSIS — N186 End stage renal disease: Secondary | ICD-10-CM | POA: Diagnosis not present

## 2017-06-24 DIAGNOSIS — N186 End stage renal disease: Secondary | ICD-10-CM | POA: Diagnosis not present

## 2017-06-24 DIAGNOSIS — N2589 Other disorders resulting from impaired renal tubular function: Secondary | ICD-10-CM | POA: Diagnosis not present

## 2017-06-24 DIAGNOSIS — D509 Iron deficiency anemia, unspecified: Secondary | ICD-10-CM | POA: Diagnosis not present

## 2017-06-24 DIAGNOSIS — N2581 Secondary hyperparathyroidism of renal origin: Secondary | ICD-10-CM | POA: Diagnosis not present

## 2017-06-24 DIAGNOSIS — Z4931 Encounter for adequacy testing for hemodialysis: Secondary | ICD-10-CM | POA: Diagnosis not present

## 2017-06-24 DIAGNOSIS — M810 Age-related osteoporosis without current pathological fracture: Secondary | ICD-10-CM | POA: Diagnosis not present

## 2017-06-24 DIAGNOSIS — D631 Anemia in chronic kidney disease: Secondary | ICD-10-CM | POA: Diagnosis not present

## 2017-06-24 DIAGNOSIS — Z780 Asymptomatic menopausal state: Secondary | ICD-10-CM | POA: Diagnosis not present

## 2017-06-25 DIAGNOSIS — Z4931 Encounter for adequacy testing for hemodialysis: Secondary | ICD-10-CM | POA: Diagnosis not present

## 2017-06-25 DIAGNOSIS — N186 End stage renal disease: Secondary | ICD-10-CM | POA: Diagnosis not present

## 2017-06-25 DIAGNOSIS — D509 Iron deficiency anemia, unspecified: Secondary | ICD-10-CM | POA: Diagnosis not present

## 2017-06-25 DIAGNOSIS — N2581 Secondary hyperparathyroidism of renal origin: Secondary | ICD-10-CM | POA: Diagnosis not present

## 2017-06-25 DIAGNOSIS — D631 Anemia in chronic kidney disease: Secondary | ICD-10-CM | POA: Diagnosis not present

## 2017-06-25 DIAGNOSIS — N2589 Other disorders resulting from impaired renal tubular function: Secondary | ICD-10-CM | POA: Diagnosis not present

## 2017-06-26 DIAGNOSIS — D631 Anemia in chronic kidney disease: Secondary | ICD-10-CM | POA: Diagnosis not present

## 2017-06-26 DIAGNOSIS — N2589 Other disorders resulting from impaired renal tubular function: Secondary | ICD-10-CM | POA: Diagnosis not present

## 2017-06-26 DIAGNOSIS — E785 Hyperlipidemia, unspecified: Secondary | ICD-10-CM | POA: Diagnosis not present

## 2017-06-26 DIAGNOSIS — E119 Type 2 diabetes mellitus without complications: Secondary | ICD-10-CM | POA: Diagnosis not present

## 2017-06-26 DIAGNOSIS — D509 Iron deficiency anemia, unspecified: Secondary | ICD-10-CM | POA: Diagnosis not present

## 2017-06-26 DIAGNOSIS — Z4931 Encounter for adequacy testing for hemodialysis: Secondary | ICD-10-CM | POA: Diagnosis not present

## 2017-06-26 DIAGNOSIS — N2581 Secondary hyperparathyroidism of renal origin: Secondary | ICD-10-CM | POA: Diagnosis not present

## 2017-06-26 DIAGNOSIS — N186 End stage renal disease: Secondary | ICD-10-CM | POA: Diagnosis not present

## 2017-06-27 DIAGNOSIS — D631 Anemia in chronic kidney disease: Secondary | ICD-10-CM | POA: Diagnosis not present

## 2017-06-27 DIAGNOSIS — N186 End stage renal disease: Secondary | ICD-10-CM | POA: Diagnosis not present

## 2017-06-27 DIAGNOSIS — D509 Iron deficiency anemia, unspecified: Secondary | ICD-10-CM | POA: Diagnosis not present

## 2017-06-27 DIAGNOSIS — N2581 Secondary hyperparathyroidism of renal origin: Secondary | ICD-10-CM | POA: Diagnosis not present

## 2017-06-27 DIAGNOSIS — Z4931 Encounter for adequacy testing for hemodialysis: Secondary | ICD-10-CM | POA: Diagnosis not present

## 2017-06-27 DIAGNOSIS — N2589 Other disorders resulting from impaired renal tubular function: Secondary | ICD-10-CM | POA: Diagnosis not present

## 2017-06-28 DIAGNOSIS — N2581 Secondary hyperparathyroidism of renal origin: Secondary | ICD-10-CM | POA: Diagnosis not present

## 2017-06-28 DIAGNOSIS — Z4931 Encounter for adequacy testing for hemodialysis: Secondary | ICD-10-CM | POA: Diagnosis not present

## 2017-06-28 DIAGNOSIS — N2589 Other disorders resulting from impaired renal tubular function: Secondary | ICD-10-CM | POA: Diagnosis not present

## 2017-06-28 DIAGNOSIS — N186 End stage renal disease: Secondary | ICD-10-CM | POA: Diagnosis not present

## 2017-06-28 DIAGNOSIS — D631 Anemia in chronic kidney disease: Secondary | ICD-10-CM | POA: Diagnosis not present

## 2017-06-28 DIAGNOSIS — D509 Iron deficiency anemia, unspecified: Secondary | ICD-10-CM | POA: Diagnosis not present

## 2017-06-29 DIAGNOSIS — N186 End stage renal disease: Secondary | ICD-10-CM | POA: Diagnosis not present

## 2017-06-29 DIAGNOSIS — D509 Iron deficiency anemia, unspecified: Secondary | ICD-10-CM | POA: Diagnosis not present

## 2017-06-29 DIAGNOSIS — Z4931 Encounter for adequacy testing for hemodialysis: Secondary | ICD-10-CM | POA: Diagnosis not present

## 2017-06-29 DIAGNOSIS — D631 Anemia in chronic kidney disease: Secondary | ICD-10-CM | POA: Diagnosis not present

## 2017-06-29 DIAGNOSIS — N2589 Other disorders resulting from impaired renal tubular function: Secondary | ICD-10-CM | POA: Diagnosis not present

## 2017-06-29 DIAGNOSIS — N2581 Secondary hyperparathyroidism of renal origin: Secondary | ICD-10-CM | POA: Diagnosis not present

## 2017-06-30 DIAGNOSIS — Z4931 Encounter for adequacy testing for hemodialysis: Secondary | ICD-10-CM | POA: Diagnosis not present

## 2017-06-30 DIAGNOSIS — N2589 Other disorders resulting from impaired renal tubular function: Secondary | ICD-10-CM | POA: Diagnosis not present

## 2017-06-30 DIAGNOSIS — N186 End stage renal disease: Secondary | ICD-10-CM | POA: Diagnosis not present

## 2017-06-30 DIAGNOSIS — N2581 Secondary hyperparathyroidism of renal origin: Secondary | ICD-10-CM | POA: Diagnosis not present

## 2017-06-30 DIAGNOSIS — D631 Anemia in chronic kidney disease: Secondary | ICD-10-CM | POA: Diagnosis not present

## 2017-06-30 DIAGNOSIS — D509 Iron deficiency anemia, unspecified: Secondary | ICD-10-CM | POA: Diagnosis not present

## 2017-07-01 DIAGNOSIS — D631 Anemia in chronic kidney disease: Secondary | ICD-10-CM | POA: Diagnosis not present

## 2017-07-01 DIAGNOSIS — Z4931 Encounter for adequacy testing for hemodialysis: Secondary | ICD-10-CM | POA: Diagnosis not present

## 2017-07-01 DIAGNOSIS — N2589 Other disorders resulting from impaired renal tubular function: Secondary | ICD-10-CM | POA: Diagnosis not present

## 2017-07-01 DIAGNOSIS — N186 End stage renal disease: Secondary | ICD-10-CM | POA: Diagnosis not present

## 2017-07-01 DIAGNOSIS — N2581 Secondary hyperparathyroidism of renal origin: Secondary | ICD-10-CM | POA: Diagnosis not present

## 2017-07-01 DIAGNOSIS — D509 Iron deficiency anemia, unspecified: Secondary | ICD-10-CM | POA: Diagnosis not present

## 2017-07-02 DIAGNOSIS — N186 End stage renal disease: Secondary | ICD-10-CM | POA: Diagnosis not present

## 2017-07-02 DIAGNOSIS — N2581 Secondary hyperparathyroidism of renal origin: Secondary | ICD-10-CM | POA: Diagnosis not present

## 2017-07-02 DIAGNOSIS — D631 Anemia in chronic kidney disease: Secondary | ICD-10-CM | POA: Diagnosis not present

## 2017-07-02 DIAGNOSIS — Z4931 Encounter for adequacy testing for hemodialysis: Secondary | ICD-10-CM | POA: Diagnosis not present

## 2017-07-02 DIAGNOSIS — N2589 Other disorders resulting from impaired renal tubular function: Secondary | ICD-10-CM | POA: Diagnosis not present

## 2017-07-02 DIAGNOSIS — D509 Iron deficiency anemia, unspecified: Secondary | ICD-10-CM | POA: Diagnosis not present

## 2017-07-03 DIAGNOSIS — D631 Anemia in chronic kidney disease: Secondary | ICD-10-CM | POA: Diagnosis not present

## 2017-07-03 DIAGNOSIS — N2581 Secondary hyperparathyroidism of renal origin: Secondary | ICD-10-CM | POA: Diagnosis not present

## 2017-07-03 DIAGNOSIS — D509 Iron deficiency anemia, unspecified: Secondary | ICD-10-CM | POA: Diagnosis not present

## 2017-07-03 DIAGNOSIS — N2589 Other disorders resulting from impaired renal tubular function: Secondary | ICD-10-CM | POA: Diagnosis not present

## 2017-07-03 DIAGNOSIS — Z4931 Encounter for adequacy testing for hemodialysis: Secondary | ICD-10-CM | POA: Diagnosis not present

## 2017-07-03 DIAGNOSIS — N186 End stage renal disease: Secondary | ICD-10-CM | POA: Diagnosis not present

## 2017-07-04 DIAGNOSIS — N2581 Secondary hyperparathyroidism of renal origin: Secondary | ICD-10-CM | POA: Diagnosis not present

## 2017-07-04 DIAGNOSIS — N186 End stage renal disease: Secondary | ICD-10-CM | POA: Diagnosis not present

## 2017-07-04 DIAGNOSIS — D631 Anemia in chronic kidney disease: Secondary | ICD-10-CM | POA: Diagnosis not present

## 2017-07-04 DIAGNOSIS — D509 Iron deficiency anemia, unspecified: Secondary | ICD-10-CM | POA: Diagnosis not present

## 2017-07-04 DIAGNOSIS — N2589 Other disorders resulting from impaired renal tubular function: Secondary | ICD-10-CM | POA: Diagnosis not present

## 2017-07-04 DIAGNOSIS — Z4931 Encounter for adequacy testing for hemodialysis: Secondary | ICD-10-CM | POA: Diagnosis not present

## 2017-07-05 DIAGNOSIS — Z4931 Encounter for adequacy testing for hemodialysis: Secondary | ICD-10-CM | POA: Diagnosis not present

## 2017-07-05 DIAGNOSIS — N2589 Other disorders resulting from impaired renal tubular function: Secondary | ICD-10-CM | POA: Diagnosis not present

## 2017-07-05 DIAGNOSIS — D631 Anemia in chronic kidney disease: Secondary | ICD-10-CM | POA: Diagnosis not present

## 2017-07-05 DIAGNOSIS — N2581 Secondary hyperparathyroidism of renal origin: Secondary | ICD-10-CM | POA: Diagnosis not present

## 2017-07-05 DIAGNOSIS — N186 End stage renal disease: Secondary | ICD-10-CM | POA: Diagnosis not present

## 2017-07-05 DIAGNOSIS — D509 Iron deficiency anemia, unspecified: Secondary | ICD-10-CM | POA: Diagnosis not present

## 2017-07-06 DIAGNOSIS — Z4931 Encounter for adequacy testing for hemodialysis: Secondary | ICD-10-CM | POA: Diagnosis not present

## 2017-07-06 DIAGNOSIS — D509 Iron deficiency anemia, unspecified: Secondary | ICD-10-CM | POA: Diagnosis not present

## 2017-07-06 DIAGNOSIS — N186 End stage renal disease: Secondary | ICD-10-CM | POA: Diagnosis not present

## 2017-07-06 DIAGNOSIS — N2581 Secondary hyperparathyroidism of renal origin: Secondary | ICD-10-CM | POA: Diagnosis not present

## 2017-07-06 DIAGNOSIS — D631 Anemia in chronic kidney disease: Secondary | ICD-10-CM | POA: Diagnosis not present

## 2017-07-06 DIAGNOSIS — N2589 Other disorders resulting from impaired renal tubular function: Secondary | ICD-10-CM | POA: Diagnosis not present

## 2017-07-07 DIAGNOSIS — D631 Anemia in chronic kidney disease: Secondary | ICD-10-CM | POA: Diagnosis not present

## 2017-07-07 DIAGNOSIS — N2589 Other disorders resulting from impaired renal tubular function: Secondary | ICD-10-CM | POA: Diagnosis not present

## 2017-07-07 DIAGNOSIS — D509 Iron deficiency anemia, unspecified: Secondary | ICD-10-CM | POA: Diagnosis not present

## 2017-07-07 DIAGNOSIS — N186 End stage renal disease: Secondary | ICD-10-CM | POA: Diagnosis not present

## 2017-07-07 DIAGNOSIS — Z4931 Encounter for adequacy testing for hemodialysis: Secondary | ICD-10-CM | POA: Diagnosis not present

## 2017-07-07 DIAGNOSIS — N2581 Secondary hyperparathyroidism of renal origin: Secondary | ICD-10-CM | POA: Diagnosis not present

## 2017-07-08 DIAGNOSIS — Z4931 Encounter for adequacy testing for hemodialysis: Secondary | ICD-10-CM | POA: Diagnosis not present

## 2017-07-08 DIAGNOSIS — N2581 Secondary hyperparathyroidism of renal origin: Secondary | ICD-10-CM | POA: Diagnosis not present

## 2017-07-08 DIAGNOSIS — N2589 Other disorders resulting from impaired renal tubular function: Secondary | ICD-10-CM | POA: Diagnosis not present

## 2017-07-08 DIAGNOSIS — D509 Iron deficiency anemia, unspecified: Secondary | ICD-10-CM | POA: Diagnosis not present

## 2017-07-08 DIAGNOSIS — N186 End stage renal disease: Secondary | ICD-10-CM | POA: Diagnosis not present

## 2017-07-08 DIAGNOSIS — D631 Anemia in chronic kidney disease: Secondary | ICD-10-CM | POA: Diagnosis not present

## 2017-07-09 DIAGNOSIS — N186 End stage renal disease: Secondary | ICD-10-CM | POA: Diagnosis not present

## 2017-07-09 DIAGNOSIS — Z4931 Encounter for adequacy testing for hemodialysis: Secondary | ICD-10-CM | POA: Diagnosis not present

## 2017-07-09 DIAGNOSIS — N2581 Secondary hyperparathyroidism of renal origin: Secondary | ICD-10-CM | POA: Diagnosis not present

## 2017-07-09 DIAGNOSIS — N2589 Other disorders resulting from impaired renal tubular function: Secondary | ICD-10-CM | POA: Diagnosis not present

## 2017-07-09 DIAGNOSIS — D631 Anemia in chronic kidney disease: Secondary | ICD-10-CM | POA: Diagnosis not present

## 2017-07-09 DIAGNOSIS — D509 Iron deficiency anemia, unspecified: Secondary | ICD-10-CM | POA: Diagnosis not present

## 2017-07-10 DIAGNOSIS — N186 End stage renal disease: Secondary | ICD-10-CM | POA: Diagnosis not present

## 2017-07-10 DIAGNOSIS — D509 Iron deficiency anemia, unspecified: Secondary | ICD-10-CM | POA: Diagnosis not present

## 2017-07-10 DIAGNOSIS — N2589 Other disorders resulting from impaired renal tubular function: Secondary | ICD-10-CM | POA: Diagnosis not present

## 2017-07-10 DIAGNOSIS — Z4931 Encounter for adequacy testing for hemodialysis: Secondary | ICD-10-CM | POA: Diagnosis not present

## 2017-07-10 DIAGNOSIS — D631 Anemia in chronic kidney disease: Secondary | ICD-10-CM | POA: Diagnosis not present

## 2017-07-10 DIAGNOSIS — N2581 Secondary hyperparathyroidism of renal origin: Secondary | ICD-10-CM | POA: Diagnosis not present

## 2017-07-11 DIAGNOSIS — N2581 Secondary hyperparathyroidism of renal origin: Secondary | ICD-10-CM | POA: Diagnosis not present

## 2017-07-11 DIAGNOSIS — Z4931 Encounter for adequacy testing for hemodialysis: Secondary | ICD-10-CM | POA: Diagnosis not present

## 2017-07-11 DIAGNOSIS — N186 End stage renal disease: Secondary | ICD-10-CM | POA: Diagnosis not present

## 2017-07-11 DIAGNOSIS — D509 Iron deficiency anemia, unspecified: Secondary | ICD-10-CM | POA: Diagnosis not present

## 2017-07-11 DIAGNOSIS — D631 Anemia in chronic kidney disease: Secondary | ICD-10-CM | POA: Diagnosis not present

## 2017-07-11 DIAGNOSIS — N2589 Other disorders resulting from impaired renal tubular function: Secondary | ICD-10-CM | POA: Diagnosis not present

## 2017-07-12 DIAGNOSIS — D509 Iron deficiency anemia, unspecified: Secondary | ICD-10-CM | POA: Diagnosis not present

## 2017-07-12 DIAGNOSIS — Z4931 Encounter for adequacy testing for hemodialysis: Secondary | ICD-10-CM | POA: Diagnosis not present

## 2017-07-12 DIAGNOSIS — N2589 Other disorders resulting from impaired renal tubular function: Secondary | ICD-10-CM | POA: Diagnosis not present

## 2017-07-12 DIAGNOSIS — D631 Anemia in chronic kidney disease: Secondary | ICD-10-CM | POA: Diagnosis not present

## 2017-07-12 DIAGNOSIS — N2581 Secondary hyperparathyroidism of renal origin: Secondary | ICD-10-CM | POA: Diagnosis not present

## 2017-07-12 DIAGNOSIS — N186 End stage renal disease: Secondary | ICD-10-CM | POA: Diagnosis not present

## 2017-07-13 DIAGNOSIS — N2581 Secondary hyperparathyroidism of renal origin: Secondary | ICD-10-CM | POA: Diagnosis not present

## 2017-07-13 DIAGNOSIS — Z4931 Encounter for adequacy testing for hemodialysis: Secondary | ICD-10-CM | POA: Diagnosis not present

## 2017-07-13 DIAGNOSIS — D631 Anemia in chronic kidney disease: Secondary | ICD-10-CM | POA: Diagnosis not present

## 2017-07-13 DIAGNOSIS — D509 Iron deficiency anemia, unspecified: Secondary | ICD-10-CM | POA: Diagnosis not present

## 2017-07-13 DIAGNOSIS — N2589 Other disorders resulting from impaired renal tubular function: Secondary | ICD-10-CM | POA: Diagnosis not present

## 2017-07-13 DIAGNOSIS — N186 End stage renal disease: Secondary | ICD-10-CM | POA: Diagnosis not present

## 2017-07-14 DIAGNOSIS — N186 End stage renal disease: Secondary | ICD-10-CM | POA: Diagnosis not present

## 2017-07-14 DIAGNOSIS — N2581 Secondary hyperparathyroidism of renal origin: Secondary | ICD-10-CM | POA: Diagnosis not present

## 2017-07-14 DIAGNOSIS — N2589 Other disorders resulting from impaired renal tubular function: Secondary | ICD-10-CM | POA: Diagnosis not present

## 2017-07-14 DIAGNOSIS — D509 Iron deficiency anemia, unspecified: Secondary | ICD-10-CM | POA: Diagnosis not present

## 2017-07-14 DIAGNOSIS — D631 Anemia in chronic kidney disease: Secondary | ICD-10-CM | POA: Diagnosis not present

## 2017-07-14 DIAGNOSIS — Z4931 Encounter for adequacy testing for hemodialysis: Secondary | ICD-10-CM | POA: Diagnosis not present

## 2017-07-15 DIAGNOSIS — Z4931 Encounter for adequacy testing for hemodialysis: Secondary | ICD-10-CM | POA: Diagnosis not present

## 2017-07-15 DIAGNOSIS — N186 End stage renal disease: Secondary | ICD-10-CM | POA: Diagnosis not present

## 2017-07-15 DIAGNOSIS — N2581 Secondary hyperparathyroidism of renal origin: Secondary | ICD-10-CM | POA: Diagnosis not present

## 2017-07-15 DIAGNOSIS — D631 Anemia in chronic kidney disease: Secondary | ICD-10-CM | POA: Diagnosis not present

## 2017-07-15 DIAGNOSIS — D509 Iron deficiency anemia, unspecified: Secondary | ICD-10-CM | POA: Diagnosis not present

## 2017-07-15 DIAGNOSIS — N2589 Other disorders resulting from impaired renal tubular function: Secondary | ICD-10-CM | POA: Diagnosis not present

## 2017-07-16 DIAGNOSIS — Z4931 Encounter for adequacy testing for hemodialysis: Secondary | ICD-10-CM | POA: Diagnosis not present

## 2017-07-16 DIAGNOSIS — N2581 Secondary hyperparathyroidism of renal origin: Secondary | ICD-10-CM | POA: Diagnosis not present

## 2017-07-16 DIAGNOSIS — N2589 Other disorders resulting from impaired renal tubular function: Secondary | ICD-10-CM | POA: Diagnosis not present

## 2017-07-16 DIAGNOSIS — D509 Iron deficiency anemia, unspecified: Secondary | ICD-10-CM | POA: Diagnosis not present

## 2017-07-16 DIAGNOSIS — D631 Anemia in chronic kidney disease: Secondary | ICD-10-CM | POA: Diagnosis not present

## 2017-07-16 DIAGNOSIS — N186 End stage renal disease: Secondary | ICD-10-CM | POA: Diagnosis not present

## 2017-07-17 DIAGNOSIS — D509 Iron deficiency anemia, unspecified: Secondary | ICD-10-CM | POA: Diagnosis not present

## 2017-07-17 DIAGNOSIS — N186 End stage renal disease: Secondary | ICD-10-CM | POA: Diagnosis not present

## 2017-07-17 DIAGNOSIS — Z4931 Encounter for adequacy testing for hemodialysis: Secondary | ICD-10-CM | POA: Diagnosis not present

## 2017-07-17 DIAGNOSIS — N2589 Other disorders resulting from impaired renal tubular function: Secondary | ICD-10-CM | POA: Diagnosis not present

## 2017-07-17 DIAGNOSIS — D631 Anemia in chronic kidney disease: Secondary | ICD-10-CM | POA: Diagnosis not present

## 2017-07-17 DIAGNOSIS — N2581 Secondary hyperparathyroidism of renal origin: Secondary | ICD-10-CM | POA: Diagnosis not present

## 2017-07-18 DIAGNOSIS — N2581 Secondary hyperparathyroidism of renal origin: Secondary | ICD-10-CM | POA: Diagnosis not present

## 2017-07-18 DIAGNOSIS — D509 Iron deficiency anemia, unspecified: Secondary | ICD-10-CM | POA: Diagnosis not present

## 2017-07-18 DIAGNOSIS — N2589 Other disorders resulting from impaired renal tubular function: Secondary | ICD-10-CM | POA: Diagnosis not present

## 2017-07-18 DIAGNOSIS — Z4931 Encounter for adequacy testing for hemodialysis: Secondary | ICD-10-CM | POA: Diagnosis not present

## 2017-07-18 DIAGNOSIS — N186 End stage renal disease: Secondary | ICD-10-CM | POA: Diagnosis not present

## 2017-07-18 DIAGNOSIS — D631 Anemia in chronic kidney disease: Secondary | ICD-10-CM | POA: Diagnosis not present

## 2017-07-19 DIAGNOSIS — Z4931 Encounter for adequacy testing for hemodialysis: Secondary | ICD-10-CM | POA: Diagnosis not present

## 2017-07-19 DIAGNOSIS — D509 Iron deficiency anemia, unspecified: Secondary | ICD-10-CM | POA: Diagnosis not present

## 2017-07-19 DIAGNOSIS — D631 Anemia in chronic kidney disease: Secondary | ICD-10-CM | POA: Diagnosis not present

## 2017-07-19 DIAGNOSIS — N2589 Other disorders resulting from impaired renal tubular function: Secondary | ICD-10-CM | POA: Diagnosis not present

## 2017-07-19 DIAGNOSIS — N186 End stage renal disease: Secondary | ICD-10-CM | POA: Diagnosis not present

## 2017-07-19 DIAGNOSIS — N2581 Secondary hyperparathyroidism of renal origin: Secondary | ICD-10-CM | POA: Diagnosis not present

## 2017-07-20 DIAGNOSIS — D631 Anemia in chronic kidney disease: Secondary | ICD-10-CM | POA: Diagnosis not present

## 2017-07-20 DIAGNOSIS — Z4931 Encounter for adequacy testing for hemodialysis: Secondary | ICD-10-CM | POA: Diagnosis not present

## 2017-07-20 DIAGNOSIS — N2581 Secondary hyperparathyroidism of renal origin: Secondary | ICD-10-CM | POA: Diagnosis not present

## 2017-07-20 DIAGNOSIS — D509 Iron deficiency anemia, unspecified: Secondary | ICD-10-CM | POA: Diagnosis not present

## 2017-07-20 DIAGNOSIS — N186 End stage renal disease: Secondary | ICD-10-CM | POA: Diagnosis not present

## 2017-07-20 DIAGNOSIS — N2589 Other disorders resulting from impaired renal tubular function: Secondary | ICD-10-CM | POA: Diagnosis not present

## 2017-07-21 ENCOUNTER — Emergency Department (HOSPITAL_COMMUNITY): Payer: Medicare Other

## 2017-07-21 ENCOUNTER — Encounter (HOSPITAL_COMMUNITY): Payer: Self-pay | Admitting: Emergency Medicine

## 2017-07-21 ENCOUNTER — Emergency Department (HOSPITAL_COMMUNITY)
Admission: EM | Admit: 2017-07-21 | Discharge: 2017-07-22 | Disposition: A | Discharge status: Home / Self Care | Payer: Medicare Other | Attending: Emergency Medicine | Admitting: Emergency Medicine

## 2017-07-21 DIAGNOSIS — E1122 Type 2 diabetes mellitus with diabetic chronic kidney disease: Secondary | ICD-10-CM | POA: Diagnosis not present

## 2017-07-21 DIAGNOSIS — Y69 Unspecified misadventure during surgical and medical care: Secondary | ICD-10-CM | POA: Diagnosis not present

## 2017-07-21 DIAGNOSIS — Z79899 Other long term (current) drug therapy: Secondary | ICD-10-CM | POA: Diagnosis not present

## 2017-07-21 DIAGNOSIS — G459 Transient cerebral ischemic attack, unspecified: Secondary | ICD-10-CM | POA: Diagnosis not present

## 2017-07-21 DIAGNOSIS — R29898 Other symptoms and signs involving the musculoskeletal system: Secondary | ICD-10-CM | POA: Diagnosis not present

## 2017-07-21 DIAGNOSIS — I120 Hypertensive chronic kidney disease with stage 5 chronic kidney disease or end stage renal disease: Secondary | ICD-10-CM | POA: Insufficient documentation

## 2017-07-21 DIAGNOSIS — N186 End stage renal disease: Secondary | ICD-10-CM | POA: Diagnosis not present

## 2017-07-21 DIAGNOSIS — J45909 Unspecified asthma, uncomplicated: Secondary | ICD-10-CM | POA: Insufficient documentation

## 2017-07-21 DIAGNOSIS — R200 Anesthesia of skin: Secondary | ICD-10-CM | POA: Diagnosis not present

## 2017-07-21 DIAGNOSIS — N185 Chronic kidney disease, stage 5: Secondary | ICD-10-CM | POA: Diagnosis not present

## 2017-07-21 DIAGNOSIS — R202 Paresthesia of skin: Secondary | ICD-10-CM | POA: Insufficient documentation

## 2017-07-21 DIAGNOSIS — D509 Iron deficiency anemia, unspecified: Secondary | ICD-10-CM | POA: Diagnosis not present

## 2017-07-21 DIAGNOSIS — I639 Cerebral infarction, unspecified: Secondary | ICD-10-CM | POA: Diagnosis not present

## 2017-07-21 DIAGNOSIS — Z4931 Encounter for adequacy testing for hemodialysis: Secondary | ICD-10-CM | POA: Diagnosis not present

## 2017-07-21 DIAGNOSIS — Z940 Kidney transplant status: Secondary | ICD-10-CM | POA: Insufficient documentation

## 2017-07-21 DIAGNOSIS — H539 Unspecified visual disturbance: Secondary | ICD-10-CM | POA: Insufficient documentation

## 2017-07-21 DIAGNOSIS — N2589 Other disorders resulting from impaired renal tubular function: Secondary | ICD-10-CM | POA: Diagnosis not present

## 2017-07-21 DIAGNOSIS — Z992 Dependence on renal dialysis: Secondary | ICD-10-CM | POA: Diagnosis not present

## 2017-07-21 DIAGNOSIS — T8611 Kidney transplant rejection: Secondary | ICD-10-CM | POA: Diagnosis not present

## 2017-07-21 DIAGNOSIS — Z8673 Personal history of transient ischemic attack (TIA), and cerebral infarction without residual deficits: Secondary | ICD-10-CM | POA: Diagnosis not present

## 2017-07-21 DIAGNOSIS — N2581 Secondary hyperparathyroidism of renal origin: Secondary | ICD-10-CM | POA: Diagnosis not present

## 2017-07-21 DIAGNOSIS — M6281 Muscle weakness (generalized): Secondary | ICD-10-CM | POA: Diagnosis not present

## 2017-07-21 DIAGNOSIS — D631 Anemia in chronic kidney disease: Secondary | ICD-10-CM | POA: Diagnosis not present

## 2017-07-21 DIAGNOSIS — Z7982 Long term (current) use of aspirin: Secondary | ICD-10-CM | POA: Diagnosis not present

## 2017-07-21 DIAGNOSIS — E039 Hypothyroidism, unspecified: Secondary | ICD-10-CM | POA: Diagnosis not present

## 2017-07-21 LAB — DIFFERENTIAL
BASOS ABS: 0 10*3/uL (ref 0.0–0.1)
Basophils Relative: 0 %
EOS PCT: 3 %
Eosinophils Absolute: 0.2 10*3/uL (ref 0.0–0.7)
LYMPHS ABS: 0.9 10*3/uL (ref 0.7–4.0)
LYMPHS PCT: 19 %
Monocytes Absolute: 0.3 10*3/uL (ref 0.1–1.0)
Monocytes Relative: 7 %
NEUTROS ABS: 3.4 10*3/uL (ref 1.7–7.7)
NEUTROS PCT: 71 %

## 2017-07-21 LAB — I-STAT TROPONIN, ED: Troponin i, poc: 0.04 ng/mL (ref 0.00–0.08)

## 2017-07-21 LAB — CBC
HCT: 30.5 % — ABNORMAL LOW (ref 36.0–46.0)
HEMOGLOBIN: 9.9 g/dL — AB (ref 12.0–15.0)
MCH: 29.1 pg (ref 26.0–34.0)
MCHC: 32.5 g/dL (ref 30.0–36.0)
MCV: 89.7 fL (ref 78.0–100.0)
Platelets: 207 10*3/uL (ref 150–400)
RBC: 3.4 MIL/uL — AB (ref 3.87–5.11)
RDW: 14.1 % (ref 11.5–15.5)
WBC: 4.7 10*3/uL (ref 4.0–10.5)

## 2017-07-21 LAB — APTT: aPTT: 27 seconds (ref 24–36)

## 2017-07-21 LAB — COMPREHENSIVE METABOLIC PANEL
ALBUMIN: 2.1 g/dL — AB (ref 3.5–5.0)
ALK PHOS: 106 U/L (ref 38–126)
ALT: 11 U/L — AB (ref 14–54)
AST: 16 U/L (ref 15–41)
Anion gap: 11 (ref 5–15)
BILIRUBIN TOTAL: 0.3 mg/dL (ref 0.3–1.2)
BUN: 37 mg/dL — AB (ref 6–20)
CALCIUM: 8.9 mg/dL (ref 8.9–10.3)
CO2: 25 mmol/L (ref 22–32)
CREATININE: 9.37 mg/dL — AB (ref 0.44–1.00)
Chloride: 95 mmol/L — ABNORMAL LOW (ref 101–111)
GFR calc Af Amer: 5 mL/min — ABNORMAL LOW (ref >60)
GFR calc non Af Amer: 4 mL/min — ABNORMAL LOW (ref >60)
GLUCOSE: 86 mg/dL (ref 65–99)
Potassium: 3.3 mmol/L — ABNORMAL LOW (ref 3.5–5.1)
Sodium: 131 mmol/L — ABNORMAL LOW (ref 135–145)
TOTAL PROTEIN: 5.3 g/dL — AB (ref 6.5–8.1)

## 2017-07-21 LAB — I-STAT CHEM 8, ED
BUN: 52 mg/dL — AB (ref 6–20)
CALCIUM ION: 1.05 mmol/L — AB (ref 1.15–1.40)
CHLORIDE: 90 mmol/L — AB (ref 101–111)
CREATININE: 10.1 mg/dL — AB (ref 0.44–1.00)
Glucose, Bld: 87 mg/dL (ref 65–99)
HCT: 27 % — ABNORMAL LOW (ref 36.0–46.0)
Hemoglobin: 9.2 g/dL — ABNORMAL LOW (ref 12.0–15.0)
Potassium: 4.1 mmol/L (ref 3.5–5.1)
Sodium: 130 mmol/L — ABNORMAL LOW (ref 135–145)
TCO2: 33 mmol/L — AB (ref 22–32)

## 2017-07-21 LAB — PROTIME-INR
INR: 1.06
Prothrombin Time: 13.7 seconds (ref 11.4–15.2)

## 2017-07-21 NOTE — ED Notes (Signed)
Patient transported to MRI 

## 2017-07-21 NOTE — ED Notes (Signed)
States hx of brain aneurysm.

## 2017-07-21 NOTE — ED Notes (Addendum)
Pt changing into gown, warm blankets given 

## 2017-07-21 NOTE — ED Triage Notes (Signed)
Pt to ER for evaluation of possible TIA - states around 9:30 this morning patient began having "flashing lights" in the right periphery, along with left hand numbness. This proceeded to resolve and around 1 pm began feeling weakness in right leg. Pt has hx of 2 strokes, states that the "flashing lights" have proceeded both. VSS. Patient has no stroke symptoms at this time.

## 2017-07-21 NOTE — ED Provider Notes (Signed)
[facility] EMERGENCY DEPARTMENT Provider Note   CSN: [id number] Arrival date & time: 07/21/17  1445     History   Chief Complaint Chief Complaint  Patient presents with  . Stroke Symptoms    HPI Kimberly [name] is a 61 y.o. female.  HPI 61 year old female who presents with left hand numbness followed by right leg weakness earlier today. She has a previous history of CVA without any residual deficits, kidney transplant followed by rejection on chronic peritoneal dialysis, hypertension, hyperlipidemia. She reports that at around 9:30 this morning she saw squiggly lines out of the right eye, followed by left hand numbness. Symptoms went away for an hour. At around 1 PM states that she felt her right leg become heavy and felt as if she could fall. She sat down, called her husband, who subsequently brought her to ED. States that her symptoms had resolved here. States that when her prior strokes in the past that she's had a similar ocular effect of squiggly lines. Denied any double vision, blurry vision, speech changes, headaches, nausea or vomiting, chest pain or difficulty breathing.   Past Medical History:  Diagnosis Date  . Anemia   . Asthma   . Diabetes mellitus    medication induced with  steroids  . Diverticulosis   . First degree AV block   . GERD (gastroesophageal reflux disease)   . Heart murmur    child  . History of chronic cough   . History of hiatal hernia   . History of hoarseness   . Hypertension    history; resolved on dialysis  . Hypothyroidism   . IgA nephropathy    on peritoneal dialysis  . Immunosuppression ([place])   . Kidney transplanted 01/2010   [facility], has had some rejection  . Patient on peritoneal dialysis ([place])   . Premature atrial beat   . Premature ventricular beat   . Renal transplant failure and rejection 2012  . Shingles 03/2011  . Stroke [facility]) October 2010, March 2013   cerebral aneurysm    Patient Active Problem List   Diagnosis Date Noted  . Chronic cough 09/15/2015  . Paroxysmal atrial fibrillation ([place]) 08/21/2015  . Acute pericarditis 08/05/2015  . Elevated troponin 08/05/2015  . Pain in the chest   . Atypical chest pain 08/04/2015  . LUL cavitary lesion post VATS 2015-cx neg 09/01/2014  . Pulmonary infiltrates 07/05/2014  . Peritoneal dialysis catheter in place March 2015 01/14/2014  . Ventral hernia s/p lap repair w mesh March 2015 01/14/2014  . Secondary hyperparathyroidism (of renal origin) 04/20/2013  . PVC's (premature ventricular contractions) 01/17/2012  . Stroke ([place]) 11/29/2011  . DM type 2 causing complication ([name]) 47/65/4650  . Kidney transplanted   . IgA nephropathy   . GERD 12/07/2007  . Cough variant asthma vs uacs vs components of both  11/09/2007  . Hypothyroidism 10/12/2007  . ANEMIA 10/12/2007  . HTN (hypertension) 10/12/2007  . CARDIAC ARRHYTHMIA 10/12/2007  . Asthma 10/12/2007  . CKD (chronic kidney disease) stage V requiring chronic dialysis ([place]) 10/12/2007    Past Surgical History:  Procedure Laterality Date  . CAPD INSERTION  08/25/2008   open, Dr [name]  . CAPD INSERTION N/A 12/14/2013   Procedure: LAPAROSCOPIC INSERTION CONTINUOUS AMBULATORY PERITONEAL DIALYSIS  (CAPD) CATHETER;  Surgeon: [name], MD;  Location: [place];  Service: General;  Laterality: N/A;  . CAPD REMOVAL  03/22/2010   Dr [name]  . EYE SURGERY Bilateral    radial  keratotomy  . INSERTION OF MESH N/A 12/14/2013   Procedure: INSERTION OF MESH;  Surgeon: [name], MD;  Location: [place];  Service: General;  Laterality: N/A;  . KIDNEY TRANSPLANT Right 01/2010   [place]  . LAPAROSCOPIC LYSIS OF ADHESIONS N/A 12/14/2013   Procedure: LAPAROSCOPIC LYSIS OF ADHESIONS;  Surgeon: [name], MD;  Location: [place];  Service: General;  Laterality: N/A;  . UMBILICAL HERNIA REPAIR  10/25/2009   open with mesh,  Dr [name]  . URETER REVISION  04/2011   [facility]  . VENTRAL HERNIA REPAIR N/A  12/14/2013   Procedure: LAPAROSCOPIC VENTRAL HERNIA;  Surgeon: [name], MD;  Location: [place];  Service: General;  Laterality: N/A;  . VIDEO ASSISTED THORACOSCOPY (VATS)/WEDGE RESECTION Left 09/01/2014   Procedure: VIDEO ASSISTED THORACOSCOPY (VATS)/WEDGE RESECTION;  Surgeon: [name], MD;  Location: [place];  Service: Thoracic;  Laterality: Left;  Marland Kitchen VIDEO BRONCHOSCOPY Bilateral 07/05/2014   Procedure: VIDEO BRONCHOSCOPY WITH FLUORO;  Surgeon: [name], MD;  Location: [facility];  Service: Cardiopulmonary;  Laterality: Bilateral;    OB History    No data available       Home Medications    Prior to Admission medications   Medication Sig Start Date End Date Taking? Authorizing Provider  aspirin 81 MG tablet Take 81 mg by mouth daily.   Yes [provider]  BREO ELLIPTA 200-25 MCG/INH AEPB Inhale 1 puff into the lungs every evening. 06/04/17  Yes [provider]  calcitRIOL (ROCALTROL) 0.5 MCG capsule Take 0.5 mcg by mouth daily.    Yes [provider]  chlorpheniramine (CHLOR-TRIMETON) 4 MG tablet Take 4 mg by mouth every 4 (four) hours as needed for allergies.   Yes [provider]  Cholecalciferol (VITAMIN D3) 3000 UNITS TABS Take 3,000 Units by mouth daily.   Yes [provider]  cinacalcet (SENSIPAR) 30 MG tablet Take 30 mg by mouth daily with breakfast.    Yes [provider]  Digestive Enzymes (DIGESTIVE ENZYME PO) Take 1 capsule by mouth daily.   Yes [provider]  epoetin alfa (EPOGEN,PROCRIT) 70177 UNIT/ML injection Inject 10,000 Units into the skin every 30 (thirty) days.    Yes [provider]  ferrous fumarate (HEMOCYTE - 106 MG FE) 325 (106 FE) MG TABS tablet Take 1 tablet by mouth daily.    Yes [provider]  gentamicin cream (GARAMYCIN) 0.1 % Apply 1 application topically daily.  12/21/13  Yes [provider]  lanthanum (FOSRENOL) 1000 MG chewable tablet Chew  1,000 mg by mouth daily.    Yes [provider]  levothyroxine (SYNTHROID, LEVOTHROID) 50 MCG tablet Take 50 mcg by mouth daily before breakfast.   Yes [provider]  metoCLOPramide (REGLAN) 5 MG tablet Take 5 mg by mouth daily as needed for nausea or vomiting.    Yes [provider]  potassium chloride SA (K-DUR,KLOR-CON) 20 MEQ tablet Take 20 mEq by mouth daily.   Yes [provider]  SPIRIVA RESPIMAT 1.25 MCG/ACT AERS Inhale 2 puffs into the lungs daily. 05/16/17  Yes [provider]    Family History Family History  Problem Relation Age of Onset  . Coronary artery disease Mother   . Emphysema Mother        smoked  . Ovarian cancer Mother   . Heart disease Mother   . Stroke Maternal Uncle   . Diabetes Maternal Uncle   . Kidney disease Maternal Uncle   .  Liver disease Father     Social History Social History  Substance Use Topics  . Smoking status: Never Smoker  . Smokeless tobacco: Never Used  . Alcohol use Yes     Comment: ocassional     Allergies   Other; Dapsone; Pregabalin; Sulfonamide derivatives; and Tramadol   Review of Systems Review of Systems   Physical Exam Updated Vital Signs BP 127/77 (BP Location: Right Arm)   Pulse 68   Temp 98.6 F (37 C) (Oral)   Resp 16   Ht 5\' 5"  (1.651 m)   Wt 66.5 kg (146 lb 8 oz)   SpO2 100%   BMI 24.38 kg/m   Physical Exam   ED Treatments / Results  Labs (all labs ordered are listed, but only abnormal results are displayed) Labs Reviewed  CBC - Abnormal; Notable for the following:       Result Value   RBC 3.40 (*)    Hemoglobin 9.9 (*)    HCT 30.5 (*)    All other components within normal limits  COMPREHENSIVE METABOLIC PANEL - Abnormal; Notable for the following:    Sodium 131 (*)    Potassium 3.3 (*)    Chloride 95 (*)    BUN 37 (*)    Creatinine, Ser 9.37 (*)    Total Protein 5.3 (*)    Albumin 2.1 (*)    ALT 11 (*)    GFR calc non Af Amer 4 (*)    GFR  calc Af Amer 5 (*)    All other components within normal limits  I-STAT CHEM 8, ED - Abnormal; Notable for the following:    Sodium 130 (*)    Chloride 90 (*)    BUN 52 (*)    Creatinine, Ser 10.10 (*)    Calcium, Ion 1.05 (*)    TCO2 33 (*)    Hemoglobin 9.2 (*)    HCT 27.0 (*)    All other components within normal limits  PROTIME-INR  APTT  DIFFERENTIAL  I-STAT TROPONIN, ED  CBG MONITORING, ED    EKG  EKG Interpretation  Date/Time:  Monday July 21 2017 15:39:23 EDT Ventricular Rate:  60 PR Interval:  184 QRS Duration: 86 QT Interval:  430 QTC Calculation: 430 R Axis:   87 Text Interpretation:  Normal sinus rhythm Nonspecific T wave abnormality Abnormal ECG No acute changes Confirmed by Brantley Stage [phone]) on 07/21/2017 10:17:34 PM       Radiology Ct Head Wo Contrast  Result Date: 07/21/2017 CLINICAL DATA:  Left hand numbness starting this morning with right leg numbness prominence. Framed lighting about visual field. EXAM: CT HEAD WITHOUT CONTRAST TECHNIQUE: Contiguous axial images were obtained from the base of the skull through the vertex without intravenous contrast. COMPARISON:  None. FINDINGS: Brain: Chronic minimal small vessel ischemia. No large vascular territory infarct. No acute intracranial hemorrhage, midline shift or edema. No intra-axial mass nor extra-axial fluid collections. No hydrocephalus. Midline fourth ventricle and basal cisterns. Vascular: No hyperdense vessels. Moderate atherosclerosis of the carotid siphons. Skull: Negative for fracture or focal lesions. Sinuses/Orbits: No acute finding. Other: None IMPRESSION: Chronic small vessel ischemia.  No acute intracranial abnormality. Electronically Signed   By: Ashley Royalty M.D.   On: 07/21/2017 17:23    Procedures Procedures (including critical care time)  Medications Ordered in ED Medications - No data to display   Initial Impression / Assessment and Plan / ED Course  I have reviewed the triage  vital signs and  the nursing notes.  Pertinent labs & imaging results that were available during my care of the patient were reviewed by me and considered in my medical decision making (see chart for details).     Discussed with Dr. Leonel Ramsay, who feel symptoms are atypical for TIA. Thinks this is likely atypical migraine headache. Recommended MRI and if negative can continue outpatient management.  She is asymptomatic currently. Neuro exam is in tact. CT head without acute processes. Blood work reassuring.   Mri pending. To be signed out to Dr. Wyvonnia Dusky.   Final Clinical Impressions(s) / ED Diagnoses   Final diagnoses:  Numbness and tingling in left hand  Right leg weakness    New Prescriptions New Prescriptions   No medications on file     Forde Dandy, MD 07/22/17 [phone]

## 2017-07-22 ENCOUNTER — Emergency Department (HOSPITAL_COMMUNITY): Payer: Medicare Other

## 2017-07-22 DIAGNOSIS — N2581 Secondary hyperparathyroidism of renal origin: Secondary | ICD-10-CM | POA: Diagnosis not present

## 2017-07-22 DIAGNOSIS — D631 Anemia in chronic kidney disease: Secondary | ICD-10-CM | POA: Diagnosis not present

## 2017-07-22 DIAGNOSIS — N186 End stage renal disease: Secondary | ICD-10-CM | POA: Diagnosis not present

## 2017-07-22 DIAGNOSIS — G459 Transient cerebral ischemic attack, unspecified: Secondary | ICD-10-CM | POA: Diagnosis not present

## 2017-07-22 DIAGNOSIS — D509 Iron deficiency anemia, unspecified: Secondary | ICD-10-CM | POA: Diagnosis not present

## 2017-07-22 DIAGNOSIS — N2589 Other disorders resulting from impaired renal tubular function: Secondary | ICD-10-CM | POA: Diagnosis not present

## 2017-07-22 DIAGNOSIS — Z4931 Encounter for adequacy testing for hemodialysis: Secondary | ICD-10-CM | POA: Diagnosis not present

## 2017-07-22 DIAGNOSIS — R200 Anesthesia of skin: Secondary | ICD-10-CM | POA: Diagnosis not present

## 2017-07-22 DIAGNOSIS — I639 Cerebral infarction, unspecified: Secondary | ICD-10-CM | POA: Diagnosis not present

## 2017-07-22 NOTE — Discharge Instructions (Addendum)
There is no evidence of stroke.  Follow-up with a neurologist.  Return to the ED if you develop new or worsening symptoms.

## 2017-07-22 NOTE — ED Provider Notes (Signed)
MRI negative for acute process. Patient feels back to baseline. Stable for outpatient follow up per plan established by Dr. Oleta Mouse.   BP 119/72   Pulse 60   Temp 98.6 F (37 C) (Oral)   Resp 18   Ht 5\' 5"  (1.651 m)   Wt 66.5 kg (146 lb 8 oz)   SpO2 100%   BMI 24.38 kg/m     Ezequiel Essex, MD 07/22/17 0201

## 2017-07-23 DIAGNOSIS — N2589 Other disorders resulting from impaired renal tubular function: Secondary | ICD-10-CM | POA: Diagnosis not present

## 2017-07-23 DIAGNOSIS — D631 Anemia in chronic kidney disease: Secondary | ICD-10-CM | POA: Diagnosis not present

## 2017-07-23 DIAGNOSIS — Z992 Dependence on renal dialysis: Secondary | ICD-10-CM | POA: Diagnosis not present

## 2017-07-23 DIAGNOSIS — Z4931 Encounter for adequacy testing for hemodialysis: Secondary | ICD-10-CM | POA: Diagnosis not present

## 2017-07-23 DIAGNOSIS — D509 Iron deficiency anemia, unspecified: Secondary | ICD-10-CM | POA: Diagnosis not present

## 2017-07-23 DIAGNOSIS — T8612 Kidney transplant failure: Secondary | ICD-10-CM | POA: Diagnosis not present

## 2017-07-23 DIAGNOSIS — N186 End stage renal disease: Secondary | ICD-10-CM | POA: Diagnosis not present

## 2017-07-23 DIAGNOSIS — N2581 Secondary hyperparathyroidism of renal origin: Secondary | ICD-10-CM | POA: Diagnosis not present

## 2017-07-24 DIAGNOSIS — K769 Liver disease, unspecified: Secondary | ICD-10-CM | POA: Diagnosis not present

## 2017-07-24 DIAGNOSIS — N2589 Other disorders resulting from impaired renal tubular function: Secondary | ICD-10-CM | POA: Diagnosis not present

## 2017-07-24 DIAGNOSIS — E46 Unspecified protein-calorie malnutrition: Secondary | ICD-10-CM | POA: Diagnosis not present

## 2017-07-24 DIAGNOSIS — N2581 Secondary hyperparathyroidism of renal origin: Secondary | ICD-10-CM | POA: Diagnosis not present

## 2017-07-24 DIAGNOSIS — E039 Hypothyroidism, unspecified: Secondary | ICD-10-CM | POA: Diagnosis not present

## 2017-07-24 DIAGNOSIS — D631 Anemia in chronic kidney disease: Secondary | ICD-10-CM | POA: Diagnosis not present

## 2017-07-24 DIAGNOSIS — D509 Iron deficiency anemia, unspecified: Secondary | ICD-10-CM | POA: Diagnosis not present

## 2017-07-24 DIAGNOSIS — N186 End stage renal disease: Secondary | ICD-10-CM | POA: Diagnosis not present

## 2017-07-24 DIAGNOSIS — Z79899 Other long term (current) drug therapy: Secondary | ICD-10-CM | POA: Diagnosis not present

## 2017-07-25 DIAGNOSIS — D631 Anemia in chronic kidney disease: Secondary | ICD-10-CM | POA: Diagnosis not present

## 2017-07-25 DIAGNOSIS — N2581 Secondary hyperparathyroidism of renal origin: Secondary | ICD-10-CM | POA: Diagnosis not present

## 2017-07-25 DIAGNOSIS — K769 Liver disease, unspecified: Secondary | ICD-10-CM | POA: Diagnosis not present

## 2017-07-25 DIAGNOSIS — N186 End stage renal disease: Secondary | ICD-10-CM | POA: Diagnosis not present

## 2017-07-25 DIAGNOSIS — D509 Iron deficiency anemia, unspecified: Secondary | ICD-10-CM | POA: Diagnosis not present

## 2017-07-25 DIAGNOSIS — N2589 Other disorders resulting from impaired renal tubular function: Secondary | ICD-10-CM | POA: Diagnosis not present

## 2017-07-26 DIAGNOSIS — N2589 Other disorders resulting from impaired renal tubular function: Secondary | ICD-10-CM | POA: Diagnosis not present

## 2017-07-26 DIAGNOSIS — K769 Liver disease, unspecified: Secondary | ICD-10-CM | POA: Diagnosis not present

## 2017-07-26 DIAGNOSIS — N2581 Secondary hyperparathyroidism of renal origin: Secondary | ICD-10-CM | POA: Diagnosis not present

## 2017-07-26 DIAGNOSIS — N186 End stage renal disease: Secondary | ICD-10-CM | POA: Diagnosis not present

## 2017-07-26 DIAGNOSIS — D509 Iron deficiency anemia, unspecified: Secondary | ICD-10-CM | POA: Diagnosis not present

## 2017-07-26 DIAGNOSIS — D631 Anemia in chronic kidney disease: Secondary | ICD-10-CM | POA: Diagnosis not present

## 2017-07-27 DIAGNOSIS — N186 End stage renal disease: Secondary | ICD-10-CM | POA: Diagnosis not present

## 2017-07-27 DIAGNOSIS — K769 Liver disease, unspecified: Secondary | ICD-10-CM | POA: Diagnosis not present

## 2017-07-27 DIAGNOSIS — N2589 Other disorders resulting from impaired renal tubular function: Secondary | ICD-10-CM | POA: Diagnosis not present

## 2017-07-27 DIAGNOSIS — N2581 Secondary hyperparathyroidism of renal origin: Secondary | ICD-10-CM | POA: Diagnosis not present

## 2017-07-27 DIAGNOSIS — D631 Anemia in chronic kidney disease: Secondary | ICD-10-CM | POA: Diagnosis not present

## 2017-07-27 DIAGNOSIS — D509 Iron deficiency anemia, unspecified: Secondary | ICD-10-CM | POA: Diagnosis not present

## 2017-07-28 DIAGNOSIS — D509 Iron deficiency anemia, unspecified: Secondary | ICD-10-CM | POA: Diagnosis not present

## 2017-07-28 DIAGNOSIS — K769 Liver disease, unspecified: Secondary | ICD-10-CM | POA: Diagnosis not present

## 2017-07-28 DIAGNOSIS — N2581 Secondary hyperparathyroidism of renal origin: Secondary | ICD-10-CM | POA: Diagnosis not present

## 2017-07-28 DIAGNOSIS — N186 End stage renal disease: Secondary | ICD-10-CM | POA: Diagnosis not present

## 2017-07-28 DIAGNOSIS — N2589 Other disorders resulting from impaired renal tubular function: Secondary | ICD-10-CM | POA: Diagnosis not present

## 2017-07-28 DIAGNOSIS — D631 Anemia in chronic kidney disease: Secondary | ICD-10-CM | POA: Diagnosis not present

## 2017-07-29 DIAGNOSIS — N186 End stage renal disease: Secondary | ICD-10-CM | POA: Diagnosis not present

## 2017-07-29 DIAGNOSIS — N2589 Other disorders resulting from impaired renal tubular function: Secondary | ICD-10-CM | POA: Diagnosis not present

## 2017-07-29 DIAGNOSIS — D631 Anemia in chronic kidney disease: Secondary | ICD-10-CM | POA: Diagnosis not present

## 2017-07-29 DIAGNOSIS — N2581 Secondary hyperparathyroidism of renal origin: Secondary | ICD-10-CM | POA: Diagnosis not present

## 2017-07-29 DIAGNOSIS — D509 Iron deficiency anemia, unspecified: Secondary | ICD-10-CM | POA: Diagnosis not present

## 2017-07-29 DIAGNOSIS — K769 Liver disease, unspecified: Secondary | ICD-10-CM | POA: Diagnosis not present

## 2017-07-30 DIAGNOSIS — N2581 Secondary hyperparathyroidism of renal origin: Secondary | ICD-10-CM | POA: Diagnosis not present

## 2017-07-30 DIAGNOSIS — D631 Anemia in chronic kidney disease: Secondary | ICD-10-CM | POA: Diagnosis not present

## 2017-07-30 DIAGNOSIS — D509 Iron deficiency anemia, unspecified: Secondary | ICD-10-CM | POA: Diagnosis not present

## 2017-07-30 DIAGNOSIS — N2589 Other disorders resulting from impaired renal tubular function: Secondary | ICD-10-CM | POA: Diagnosis not present

## 2017-07-30 DIAGNOSIS — K769 Liver disease, unspecified: Secondary | ICD-10-CM | POA: Diagnosis not present

## 2017-07-30 DIAGNOSIS — N186 End stage renal disease: Secondary | ICD-10-CM | POA: Diagnosis not present

## 2017-07-31 DIAGNOSIS — N186 End stage renal disease: Secondary | ICD-10-CM | POA: Diagnosis not present

## 2017-07-31 DIAGNOSIS — K769 Liver disease, unspecified: Secondary | ICD-10-CM | POA: Diagnosis not present

## 2017-07-31 DIAGNOSIS — N2581 Secondary hyperparathyroidism of renal origin: Secondary | ICD-10-CM | POA: Diagnosis not present

## 2017-07-31 DIAGNOSIS — D509 Iron deficiency anemia, unspecified: Secondary | ICD-10-CM | POA: Diagnosis not present

## 2017-07-31 DIAGNOSIS — N2589 Other disorders resulting from impaired renal tubular function: Secondary | ICD-10-CM | POA: Diagnosis not present

## 2017-07-31 DIAGNOSIS — D631 Anemia in chronic kidney disease: Secondary | ICD-10-CM | POA: Diagnosis not present

## 2017-08-01 DIAGNOSIS — N2589 Other disorders resulting from impaired renal tubular function: Secondary | ICD-10-CM | POA: Diagnosis not present

## 2017-08-01 DIAGNOSIS — D631 Anemia in chronic kidney disease: Secondary | ICD-10-CM | POA: Diagnosis not present

## 2017-08-01 DIAGNOSIS — N186 End stage renal disease: Secondary | ICD-10-CM | POA: Diagnosis not present

## 2017-08-01 DIAGNOSIS — D509 Iron deficiency anemia, unspecified: Secondary | ICD-10-CM | POA: Diagnosis not present

## 2017-08-01 DIAGNOSIS — N2581 Secondary hyperparathyroidism of renal origin: Secondary | ICD-10-CM | POA: Diagnosis not present

## 2017-08-01 DIAGNOSIS — K769 Liver disease, unspecified: Secondary | ICD-10-CM | POA: Diagnosis not present

## 2017-08-02 DIAGNOSIS — D631 Anemia in chronic kidney disease: Secondary | ICD-10-CM | POA: Diagnosis not present

## 2017-08-02 DIAGNOSIS — K769 Liver disease, unspecified: Secondary | ICD-10-CM | POA: Diagnosis not present

## 2017-08-02 DIAGNOSIS — D509 Iron deficiency anemia, unspecified: Secondary | ICD-10-CM | POA: Diagnosis not present

## 2017-08-02 DIAGNOSIS — N186 End stage renal disease: Secondary | ICD-10-CM | POA: Diagnosis not present

## 2017-08-02 DIAGNOSIS — N2589 Other disorders resulting from impaired renal tubular function: Secondary | ICD-10-CM | POA: Diagnosis not present

## 2017-08-02 DIAGNOSIS — N2581 Secondary hyperparathyroidism of renal origin: Secondary | ICD-10-CM | POA: Diagnosis not present

## 2017-08-03 DIAGNOSIS — K769 Liver disease, unspecified: Secondary | ICD-10-CM | POA: Diagnosis not present

## 2017-08-03 DIAGNOSIS — D509 Iron deficiency anemia, unspecified: Secondary | ICD-10-CM | POA: Diagnosis not present

## 2017-08-03 DIAGNOSIS — N2589 Other disorders resulting from impaired renal tubular function: Secondary | ICD-10-CM | POA: Diagnosis not present

## 2017-08-03 DIAGNOSIS — N2581 Secondary hyperparathyroidism of renal origin: Secondary | ICD-10-CM | POA: Diagnosis not present

## 2017-08-03 DIAGNOSIS — D631 Anemia in chronic kidney disease: Secondary | ICD-10-CM | POA: Diagnosis not present

## 2017-08-03 DIAGNOSIS — N186 End stage renal disease: Secondary | ICD-10-CM | POA: Diagnosis not present

## 2017-08-04 DIAGNOSIS — D631 Anemia in chronic kidney disease: Secondary | ICD-10-CM | POA: Diagnosis not present

## 2017-08-04 DIAGNOSIS — N2589 Other disorders resulting from impaired renal tubular function: Secondary | ICD-10-CM | POA: Diagnosis not present

## 2017-08-04 DIAGNOSIS — N186 End stage renal disease: Secondary | ICD-10-CM | POA: Diagnosis not present

## 2017-08-04 DIAGNOSIS — N2581 Secondary hyperparathyroidism of renal origin: Secondary | ICD-10-CM | POA: Diagnosis not present

## 2017-08-04 DIAGNOSIS — D509 Iron deficiency anemia, unspecified: Secondary | ICD-10-CM | POA: Diagnosis not present

## 2017-08-04 DIAGNOSIS — K769 Liver disease, unspecified: Secondary | ICD-10-CM | POA: Diagnosis not present

## 2017-08-05 DIAGNOSIS — N186 End stage renal disease: Secondary | ICD-10-CM | POA: Diagnosis not present

## 2017-08-05 DIAGNOSIS — N2589 Other disorders resulting from impaired renal tubular function: Secondary | ICD-10-CM | POA: Diagnosis not present

## 2017-08-05 DIAGNOSIS — N2581 Secondary hyperparathyroidism of renal origin: Secondary | ICD-10-CM | POA: Diagnosis not present

## 2017-08-05 DIAGNOSIS — D631 Anemia in chronic kidney disease: Secondary | ICD-10-CM | POA: Diagnosis not present

## 2017-08-05 DIAGNOSIS — D509 Iron deficiency anemia, unspecified: Secondary | ICD-10-CM | POA: Diagnosis not present

## 2017-08-05 DIAGNOSIS — K769 Liver disease, unspecified: Secondary | ICD-10-CM | POA: Diagnosis not present

## 2017-08-06 DIAGNOSIS — N2589 Other disorders resulting from impaired renal tubular function: Secondary | ICD-10-CM | POA: Diagnosis not present

## 2017-08-06 DIAGNOSIS — D509 Iron deficiency anemia, unspecified: Secondary | ICD-10-CM | POA: Diagnosis not present

## 2017-08-06 DIAGNOSIS — K769 Liver disease, unspecified: Secondary | ICD-10-CM | POA: Diagnosis not present

## 2017-08-06 DIAGNOSIS — M810 Age-related osteoporosis without current pathological fracture: Secondary | ICD-10-CM | POA: Diagnosis not present

## 2017-08-06 DIAGNOSIS — N186 End stage renal disease: Secondary | ICD-10-CM | POA: Diagnosis not present

## 2017-08-06 DIAGNOSIS — N2581 Secondary hyperparathyroidism of renal origin: Secondary | ICD-10-CM | POA: Diagnosis not present

## 2017-08-06 DIAGNOSIS — D631 Anemia in chronic kidney disease: Secondary | ICD-10-CM | POA: Diagnosis not present

## 2017-08-07 DIAGNOSIS — N2581 Secondary hyperparathyroidism of renal origin: Secondary | ICD-10-CM | POA: Diagnosis not present

## 2017-08-07 DIAGNOSIS — D509 Iron deficiency anemia, unspecified: Secondary | ICD-10-CM | POA: Diagnosis not present

## 2017-08-07 DIAGNOSIS — K769 Liver disease, unspecified: Secondary | ICD-10-CM | POA: Diagnosis not present

## 2017-08-07 DIAGNOSIS — N2589 Other disorders resulting from impaired renal tubular function: Secondary | ICD-10-CM | POA: Diagnosis not present

## 2017-08-07 DIAGNOSIS — D631 Anemia in chronic kidney disease: Secondary | ICD-10-CM | POA: Diagnosis not present

## 2017-08-07 DIAGNOSIS — N186 End stage renal disease: Secondary | ICD-10-CM | POA: Diagnosis not present

## 2017-08-08 DIAGNOSIS — K769 Liver disease, unspecified: Secondary | ICD-10-CM | POA: Diagnosis not present

## 2017-08-08 DIAGNOSIS — N2581 Secondary hyperparathyroidism of renal origin: Secondary | ICD-10-CM | POA: Diagnosis not present

## 2017-08-08 DIAGNOSIS — N2589 Other disorders resulting from impaired renal tubular function: Secondary | ICD-10-CM | POA: Diagnosis not present

## 2017-08-08 DIAGNOSIS — D631 Anemia in chronic kidney disease: Secondary | ICD-10-CM | POA: Diagnosis not present

## 2017-08-08 DIAGNOSIS — D509 Iron deficiency anemia, unspecified: Secondary | ICD-10-CM | POA: Diagnosis not present

## 2017-08-08 DIAGNOSIS — N186 End stage renal disease: Secondary | ICD-10-CM | POA: Diagnosis not present

## 2017-08-09 DIAGNOSIS — D631 Anemia in chronic kidney disease: Secondary | ICD-10-CM | POA: Diagnosis not present

## 2017-08-09 DIAGNOSIS — N186 End stage renal disease: Secondary | ICD-10-CM | POA: Diagnosis not present

## 2017-08-09 DIAGNOSIS — K769 Liver disease, unspecified: Secondary | ICD-10-CM | POA: Diagnosis not present

## 2017-08-09 DIAGNOSIS — N2581 Secondary hyperparathyroidism of renal origin: Secondary | ICD-10-CM | POA: Diagnosis not present

## 2017-08-09 DIAGNOSIS — D509 Iron deficiency anemia, unspecified: Secondary | ICD-10-CM | POA: Diagnosis not present

## 2017-08-09 DIAGNOSIS — N2589 Other disorders resulting from impaired renal tubular function: Secondary | ICD-10-CM | POA: Diagnosis not present

## 2017-08-10 DIAGNOSIS — N2589 Other disorders resulting from impaired renal tubular function: Secondary | ICD-10-CM | POA: Diagnosis not present

## 2017-08-10 DIAGNOSIS — N186 End stage renal disease: Secondary | ICD-10-CM | POA: Diagnosis not present

## 2017-08-10 DIAGNOSIS — K769 Liver disease, unspecified: Secondary | ICD-10-CM | POA: Diagnosis not present

## 2017-08-10 DIAGNOSIS — D631 Anemia in chronic kidney disease: Secondary | ICD-10-CM | POA: Diagnosis not present

## 2017-08-10 DIAGNOSIS — D509 Iron deficiency anemia, unspecified: Secondary | ICD-10-CM | POA: Diagnosis not present

## 2017-08-10 DIAGNOSIS — N2581 Secondary hyperparathyroidism of renal origin: Secondary | ICD-10-CM | POA: Diagnosis not present

## 2017-08-11 DIAGNOSIS — N185 Chronic kidney disease, stage 5: Secondary | ICD-10-CM | POA: Diagnosis not present

## 2017-08-11 DIAGNOSIS — D124 Benign neoplasm of descending colon: Secondary | ICD-10-CM | POA: Diagnosis not present

## 2017-08-11 DIAGNOSIS — Z1211 Encounter for screening for malignant neoplasm of colon: Secondary | ICD-10-CM | POA: Diagnosis not present

## 2017-08-11 DIAGNOSIS — D509 Iron deficiency anemia, unspecified: Secondary | ICD-10-CM | POA: Diagnosis not present

## 2017-08-11 DIAGNOSIS — Z1212 Encounter for screening for malignant neoplasm of rectum: Secondary | ICD-10-CM | POA: Diagnosis not present

## 2017-08-11 DIAGNOSIS — D631 Anemia in chronic kidney disease: Secondary | ICD-10-CM | POA: Diagnosis not present

## 2017-08-11 DIAGNOSIS — J45909 Unspecified asthma, uncomplicated: Secondary | ICD-10-CM | POA: Diagnosis not present

## 2017-08-11 DIAGNOSIS — N2581 Secondary hyperparathyroidism of renal origin: Secondary | ICD-10-CM | POA: Diagnosis not present

## 2017-08-11 DIAGNOSIS — N186 End stage renal disease: Secondary | ICD-10-CM | POA: Diagnosis not present

## 2017-08-11 DIAGNOSIS — K769 Liver disease, unspecified: Secondary | ICD-10-CM | POA: Diagnosis not present

## 2017-08-11 DIAGNOSIS — K5730 Diverticulosis of large intestine without perforation or abscess without bleeding: Secondary | ICD-10-CM | POA: Diagnosis not present

## 2017-08-11 DIAGNOSIS — N2589 Other disorders resulting from impaired renal tubular function: Secondary | ICD-10-CM | POA: Diagnosis not present

## 2017-08-11 DIAGNOSIS — K635 Polyp of colon: Secondary | ICD-10-CM | POA: Diagnosis not present

## 2017-08-11 MED ORDER — BUPIVACAINE-EPINEPHRINE (PF) 0.5% -1:200000 IJ SOLN
INTRAMUSCULAR | Status: AC
  Filled 2017-08-11: qty 30

## 2017-08-12 DIAGNOSIS — D631 Anemia in chronic kidney disease: Secondary | ICD-10-CM | POA: Diagnosis not present

## 2017-08-12 DIAGNOSIS — D509 Iron deficiency anemia, unspecified: Secondary | ICD-10-CM | POA: Diagnosis not present

## 2017-08-12 DIAGNOSIS — K769 Liver disease, unspecified: Secondary | ICD-10-CM | POA: Diagnosis not present

## 2017-08-12 DIAGNOSIS — N2581 Secondary hyperparathyroidism of renal origin: Secondary | ICD-10-CM | POA: Diagnosis not present

## 2017-08-12 DIAGNOSIS — N186 End stage renal disease: Secondary | ICD-10-CM | POA: Diagnosis not present

## 2017-08-12 DIAGNOSIS — N2589 Other disorders resulting from impaired renal tubular function: Secondary | ICD-10-CM | POA: Diagnosis not present

## 2017-08-13 DIAGNOSIS — N2581 Secondary hyperparathyroidism of renal origin: Secondary | ICD-10-CM | POA: Diagnosis not present

## 2017-08-13 DIAGNOSIS — K769 Liver disease, unspecified: Secondary | ICD-10-CM | POA: Diagnosis not present

## 2017-08-13 DIAGNOSIS — D631 Anemia in chronic kidney disease: Secondary | ICD-10-CM | POA: Diagnosis not present

## 2017-08-13 DIAGNOSIS — D509 Iron deficiency anemia, unspecified: Secondary | ICD-10-CM | POA: Diagnosis not present

## 2017-08-13 DIAGNOSIS — N2589 Other disorders resulting from impaired renal tubular function: Secondary | ICD-10-CM | POA: Diagnosis not present

## 2017-08-13 DIAGNOSIS — N186 End stage renal disease: Secondary | ICD-10-CM | POA: Diagnosis not present

## 2017-08-14 DIAGNOSIS — N2581 Secondary hyperparathyroidism of renal origin: Secondary | ICD-10-CM | POA: Diagnosis not present

## 2017-08-14 DIAGNOSIS — D509 Iron deficiency anemia, unspecified: Secondary | ICD-10-CM | POA: Diagnosis not present

## 2017-08-14 DIAGNOSIS — N186 End stage renal disease: Secondary | ICD-10-CM | POA: Diagnosis not present

## 2017-08-14 DIAGNOSIS — N2589 Other disorders resulting from impaired renal tubular function: Secondary | ICD-10-CM | POA: Diagnosis not present

## 2017-08-14 DIAGNOSIS — K769 Liver disease, unspecified: Secondary | ICD-10-CM | POA: Diagnosis not present

## 2017-08-14 DIAGNOSIS — D631 Anemia in chronic kidney disease: Secondary | ICD-10-CM | POA: Diagnosis not present

## 2017-08-15 DIAGNOSIS — D509 Iron deficiency anemia, unspecified: Secondary | ICD-10-CM | POA: Diagnosis not present

## 2017-08-15 DIAGNOSIS — N2589 Other disorders resulting from impaired renal tubular function: Secondary | ICD-10-CM | POA: Diagnosis not present

## 2017-08-15 DIAGNOSIS — K769 Liver disease, unspecified: Secondary | ICD-10-CM | POA: Diagnosis not present

## 2017-08-15 DIAGNOSIS — N2581 Secondary hyperparathyroidism of renal origin: Secondary | ICD-10-CM | POA: Diagnosis not present

## 2017-08-15 DIAGNOSIS — D631 Anemia in chronic kidney disease: Secondary | ICD-10-CM | POA: Diagnosis not present

## 2017-08-15 DIAGNOSIS — N186 End stage renal disease: Secondary | ICD-10-CM | POA: Diagnosis not present

## 2017-08-16 DIAGNOSIS — N2589 Other disorders resulting from impaired renal tubular function: Secondary | ICD-10-CM | POA: Diagnosis not present

## 2017-08-16 DIAGNOSIS — D509 Iron deficiency anemia, unspecified: Secondary | ICD-10-CM | POA: Diagnosis not present

## 2017-08-16 DIAGNOSIS — D631 Anemia in chronic kidney disease: Secondary | ICD-10-CM | POA: Diagnosis not present

## 2017-08-16 DIAGNOSIS — N2581 Secondary hyperparathyroidism of renal origin: Secondary | ICD-10-CM | POA: Diagnosis not present

## 2017-08-16 DIAGNOSIS — N186 End stage renal disease: Secondary | ICD-10-CM | POA: Diagnosis not present

## 2017-08-16 DIAGNOSIS — K769 Liver disease, unspecified: Secondary | ICD-10-CM | POA: Diagnosis not present

## 2017-08-17 DIAGNOSIS — D509 Iron deficiency anemia, unspecified: Secondary | ICD-10-CM | POA: Diagnosis not present

## 2017-08-17 DIAGNOSIS — N2581 Secondary hyperparathyroidism of renal origin: Secondary | ICD-10-CM | POA: Diagnosis not present

## 2017-08-17 DIAGNOSIS — D631 Anemia in chronic kidney disease: Secondary | ICD-10-CM | POA: Diagnosis not present

## 2017-08-17 DIAGNOSIS — N186 End stage renal disease: Secondary | ICD-10-CM | POA: Diagnosis not present

## 2017-08-17 DIAGNOSIS — N2589 Other disorders resulting from impaired renal tubular function: Secondary | ICD-10-CM | POA: Diagnosis not present

## 2017-08-17 DIAGNOSIS — K769 Liver disease, unspecified: Secondary | ICD-10-CM | POA: Diagnosis not present

## 2017-08-18 DIAGNOSIS — N2581 Secondary hyperparathyroidism of renal origin: Secondary | ICD-10-CM | POA: Diagnosis not present

## 2017-08-18 DIAGNOSIS — D631 Anemia in chronic kidney disease: Secondary | ICD-10-CM | POA: Diagnosis not present

## 2017-08-18 DIAGNOSIS — K769 Liver disease, unspecified: Secondary | ICD-10-CM | POA: Diagnosis not present

## 2017-08-18 DIAGNOSIS — N2589 Other disorders resulting from impaired renal tubular function: Secondary | ICD-10-CM | POA: Diagnosis not present

## 2017-08-18 DIAGNOSIS — N186 End stage renal disease: Secondary | ICD-10-CM | POA: Diagnosis not present

## 2017-08-18 DIAGNOSIS — D509 Iron deficiency anemia, unspecified: Secondary | ICD-10-CM | POA: Diagnosis not present

## 2017-08-19 DIAGNOSIS — K769 Liver disease, unspecified: Secondary | ICD-10-CM | POA: Diagnosis not present

## 2017-08-19 DIAGNOSIS — N2589 Other disorders resulting from impaired renal tubular function: Secondary | ICD-10-CM | POA: Diagnosis not present

## 2017-08-19 DIAGNOSIS — N186 End stage renal disease: Secondary | ICD-10-CM | POA: Diagnosis not present

## 2017-08-19 DIAGNOSIS — D509 Iron deficiency anemia, unspecified: Secondary | ICD-10-CM | POA: Diagnosis not present

## 2017-08-19 DIAGNOSIS — D631 Anemia in chronic kidney disease: Secondary | ICD-10-CM | POA: Diagnosis not present

## 2017-08-19 DIAGNOSIS — N2581 Secondary hyperparathyroidism of renal origin: Secondary | ICD-10-CM | POA: Diagnosis not present

## 2017-08-20 DIAGNOSIS — N2589 Other disorders resulting from impaired renal tubular function: Secondary | ICD-10-CM | POA: Diagnosis not present

## 2017-08-20 DIAGNOSIS — D509 Iron deficiency anemia, unspecified: Secondary | ICD-10-CM | POA: Diagnosis not present

## 2017-08-20 DIAGNOSIS — N186 End stage renal disease: Secondary | ICD-10-CM | POA: Diagnosis not present

## 2017-08-20 DIAGNOSIS — K769 Liver disease, unspecified: Secondary | ICD-10-CM | POA: Diagnosis not present

## 2017-08-20 DIAGNOSIS — D631 Anemia in chronic kidney disease: Secondary | ICD-10-CM | POA: Diagnosis not present

## 2017-08-20 DIAGNOSIS — N2581 Secondary hyperparathyroidism of renal origin: Secondary | ICD-10-CM | POA: Diagnosis not present

## 2017-08-21 DIAGNOSIS — D631 Anemia in chronic kidney disease: Secondary | ICD-10-CM | POA: Diagnosis not present

## 2017-08-21 DIAGNOSIS — D509 Iron deficiency anemia, unspecified: Secondary | ICD-10-CM | POA: Diagnosis not present

## 2017-08-21 DIAGNOSIS — K769 Liver disease, unspecified: Secondary | ICD-10-CM | POA: Diagnosis not present

## 2017-08-21 DIAGNOSIS — N2581 Secondary hyperparathyroidism of renal origin: Secondary | ICD-10-CM | POA: Diagnosis not present

## 2017-08-21 DIAGNOSIS — N2589 Other disorders resulting from impaired renal tubular function: Secondary | ICD-10-CM | POA: Diagnosis not present

## 2017-08-21 DIAGNOSIS — N186 End stage renal disease: Secondary | ICD-10-CM | POA: Diagnosis not present

## 2017-08-21 DIAGNOSIS — H5310 Unspecified subjective visual disturbances: Secondary | ICD-10-CM | POA: Diagnosis not present

## 2017-08-22 ENCOUNTER — Emergency Department (HOSPITAL_COMMUNITY): Payer: Medicare Other

## 2017-08-22 ENCOUNTER — Ambulatory Visit
Admission: RE | Admit: 2017-08-22 | Discharge: 2017-08-22 | Disposition: A | Discharge status: Home / Self Care | Payer: Medicare Other | Source: Ambulatory Visit | Attending: Family Medicine | Admitting: Family Medicine

## 2017-08-22 ENCOUNTER — Encounter (HOSPITAL_COMMUNITY): Payer: Self-pay | Admitting: *Deleted

## 2017-08-22 ENCOUNTER — Emergency Department (HOSPITAL_COMMUNITY)
Admission: EM | Admit: 2017-08-22 | Discharge: 2017-08-22 | Disposition: A | Discharge status: Home / Self Care | Payer: Medicare Other | Attending: Emergency Medicine | Admitting: Emergency Medicine

## 2017-08-22 ENCOUNTER — Other Ambulatory Visit: Payer: Self-pay | Admitting: Family Medicine

## 2017-08-22 ENCOUNTER — Other Ambulatory Visit: Payer: Self-pay

## 2017-08-22 DIAGNOSIS — I639 Cerebral infarction, unspecified: Secondary | ICD-10-CM

## 2017-08-22 DIAGNOSIS — D509 Iron deficiency anemia, unspecified: Secondary | ICD-10-CM | POA: Diagnosis not present

## 2017-08-22 DIAGNOSIS — H5310 Unspecified subjective visual disturbances: Principal | ICD-10-CM

## 2017-08-22 DIAGNOSIS — E039 Hypothyroidism, unspecified: Secondary | ICD-10-CM | POA: Diagnosis not present

## 2017-08-22 DIAGNOSIS — N186 End stage renal disease: Secondary | ICD-10-CM | POA: Diagnosis not present

## 2017-08-22 DIAGNOSIS — I69398 Other sequelae of cerebral infarction: Secondary | ICD-10-CM | POA: Diagnosis not present

## 2017-08-22 DIAGNOSIS — J45909 Unspecified asthma, uncomplicated: Secondary | ICD-10-CM | POA: Insufficient documentation

## 2017-08-22 DIAGNOSIS — I120 Hypertensive chronic kidney disease with stage 5 chronic kidney disease or end stage renal disease: Secondary | ICD-10-CM | POA: Diagnosis not present

## 2017-08-22 DIAGNOSIS — N2589 Other disorders resulting from impaired renal tubular function: Secondary | ICD-10-CM | POA: Diagnosis not present

## 2017-08-22 DIAGNOSIS — N2581 Secondary hyperparathyroidism of renal origin: Secondary | ICD-10-CM | POA: Diagnosis not present

## 2017-08-22 DIAGNOSIS — E1122 Type 2 diabetes mellitus with diabetic chronic kidney disease: Secondary | ICD-10-CM | POA: Insufficient documentation

## 2017-08-22 DIAGNOSIS — H547 Unspecified visual loss: Secondary | ICD-10-CM

## 2017-08-22 DIAGNOSIS — Z79899 Other long term (current) drug therapy: Secondary | ICD-10-CM | POA: Insufficient documentation

## 2017-08-22 DIAGNOSIS — N185 Chronic kidney disease, stage 5: Secondary | ICD-10-CM | POA: Diagnosis not present

## 2017-08-22 DIAGNOSIS — H538 Other visual disturbances: Secondary | ICD-10-CM | POA: Diagnosis present

## 2017-08-22 DIAGNOSIS — K769 Liver disease, unspecified: Secondary | ICD-10-CM | POA: Diagnosis not present

## 2017-08-22 DIAGNOSIS — Z992 Dependence on renal dialysis: Secondary | ICD-10-CM | POA: Diagnosis not present

## 2017-08-22 DIAGNOSIS — D631 Anemia in chronic kidney disease: Secondary | ICD-10-CM | POA: Diagnosis not present

## 2017-08-22 DIAGNOSIS — Z7982 Long term (current) use of aspirin: Secondary | ICD-10-CM | POA: Diagnosis not present

## 2017-08-22 DIAGNOSIS — R930 Abnormal findings on diagnostic imaging of skull and head, not elsewhere classified: Secondary | ICD-10-CM | POA: Diagnosis not present

## 2017-08-22 DIAGNOSIS — T8612 Kidney transplant failure: Secondary | ICD-10-CM | POA: Diagnosis not present

## 2017-08-22 LAB — I-STAT BETA HCG BLOOD, ED (MC, WL, AP ONLY)

## 2017-08-22 LAB — CBC
HCT: 31.7 % — ABNORMAL LOW (ref 36.0–46.0)
Hemoglobin: 10.4 g/dL — ABNORMAL LOW (ref 12.0–15.0)
MCH: 29.8 pg (ref 26.0–34.0)
MCHC: 32.8 g/dL (ref 30.0–36.0)
MCV: 90.8 fL (ref 78.0–100.0)
PLATELETS: 203 10*3/uL (ref 150–400)
RBC: 3.49 MIL/uL — AB (ref 3.87–5.11)
RDW: 13.9 % (ref 11.5–15.5)
WBC: 4 10*3/uL (ref 4.0–10.5)

## 2017-08-22 LAB — COMPREHENSIVE METABOLIC PANEL
ALT: 13 U/L — AB (ref 14–54)
AST: 19 U/L (ref 15–41)
Albumin: 2.1 g/dL — ABNORMAL LOW (ref 3.5–5.0)
Alkaline Phosphatase: 108 U/L (ref 38–126)
Anion gap: 10 (ref 5–15)
BUN: 32 mg/dL — AB (ref 6–20)
CO2: 29 mmol/L (ref 22–32)
CREATININE: 9.13 mg/dL — AB (ref 0.44–1.00)
Calcium: 9.3 mg/dL (ref 8.9–10.3)
Chloride: 94 mmol/L — ABNORMAL LOW (ref 101–111)
GFR calc Af Amer: 5 mL/min — ABNORMAL LOW (ref >60)
GFR calc non Af Amer: 4 mL/min — ABNORMAL LOW (ref >60)
Glucose, Bld: 94 mg/dL (ref 65–99)
Potassium: 3.2 mmol/L — ABNORMAL LOW (ref 3.5–5.1)
SODIUM: 133 mmol/L — AB (ref 135–145)
Total Bilirubin: 0.4 mg/dL (ref 0.3–1.2)
Total Protein: 5 g/dL — ABNORMAL LOW (ref 6.5–8.1)

## 2017-08-22 LAB — DIFFERENTIAL
BASOS ABS: 0 10*3/uL (ref 0.0–0.1)
BASOS PCT: 0 %
Eosinophils Absolute: 0.1 10*3/uL (ref 0.0–0.7)
Eosinophils Relative: 3 %
LYMPHS PCT: 18 %
Lymphs Abs: 0.7 10*3/uL (ref 0.7–4.0)
MONOS PCT: 6 %
Monocytes Absolute: 0.2 10*3/uL (ref 0.1–1.0)
NEUTROS ABS: 3 10*3/uL (ref 1.7–7.7)
Neutrophils Relative %: 73 %

## 2017-08-22 LAB — I-STAT CHEM 8, ED
BUN: 30 mg/dL — ABNORMAL HIGH (ref 6–20)
CHLORIDE: 92 mmol/L — AB (ref 101–111)
Calcium, Ion: 1.15 mmol/L (ref 1.15–1.40)
Creatinine, Ser: 8.7 mg/dL — ABNORMAL HIGH (ref 0.44–1.00)
Glucose, Bld: 97 mg/dL (ref 65–99)
HEMATOCRIT: 30 % — AB (ref 36.0–46.0)
Hemoglobin: 10.2 g/dL — ABNORMAL LOW (ref 12.0–15.0)
POTASSIUM: 3.2 mmol/L — AB (ref 3.5–5.1)
SODIUM: 134 mmol/L — AB (ref 135–145)
TCO2: 30 mmol/L (ref 22–32)

## 2017-08-22 LAB — CBG MONITORING, ED
GLUCOSE-CAPILLARY: 99 mg/dL (ref 65–99)
Glucose-Capillary: 88 mg/dL (ref 65–99)

## 2017-08-22 LAB — I-STAT TROPONIN, ED: Troponin i, poc: 0.04 ng/mL (ref 0.00–0.08)

## 2017-08-22 LAB — PROTIME-INR
INR: 0.99
Prothrombin Time: 13 seconds (ref 11.4–15.2)

## 2017-08-22 LAB — APTT: APTT: 25 s (ref 24–36)

## 2017-08-22 MED ORDER — TETRACAINE HCL 0.5 % OP SOLN
2.0000 [drp] | Freq: Once | OPHTHALMIC | Status: DC
  Filled 2017-08-22: qty 4

## 2017-08-22 MED ORDER — CYCLOPENTOLATE HCL 1 % OP SOLN: 2.0000 [drp] | Freq: Once | OPHTHALMIC | Status: DC

## 2017-08-22 NOTE — ED Notes (Signed)
Pt verbalizes understanding of d/c instructions. Pt ambulatory at d/c with all belongings.   

## 2017-08-22 NOTE — ED Triage Notes (Signed)
Pt is peritoneal dialysis pt, reports hooking up her machine on wed and noticed loss of left peripheral vision. Today has generalized leg weakness and had outpatient ct scan which showed possible infarct, sent here for MRI and further eval.

## 2017-08-22 NOTE — ED Provider Notes (Signed)
[facility] EMERGENCY DEPARTMENT Provider Note   CSN: [id number] Arrival date & time: 08/22/17  1420     History   Chief Complaint Chief Complaint  Patient presents with  . Blurred Vision    HPI Kimberly [name] is a 61 y.o. female.  HPI Patient developed decreased peripheral vision 3 days ago.  She also felt some generalized weakness in her legs..  Patient contacted her doctor regarding the symptoms.  They were evaluating by CT head referral to ophthalmology.  Patient reports she had just gone to the ophthalmologist office and was waiting for her exam when her doctor's office contacted her and told her she needed to come immediately to the emergency department because CT shows possible infarct area.  Patient has not been febrile.  No chest pain or shortness of breath.  She does home peritoneal dialysis.  Patient denies she had abdominal pain.  Reports last month she was seen with numbness that occurred in her left hand.  Reports she is getting variable symptoms of weakness or numbness no visual changes.  Reports she has gone for her evaluations but has not gotten an explanation as to what the sources for her symptoms. Past Medical History:  Diagnosis Date  . Anemia   . Asthma   . Diabetes mellitus    medication induced with  steroids  . Diverticulosis   . First degree AV block   . GERD (gastroesophageal reflux disease)   . Heart murmur    child  . History of chronic cough   . History of hiatal hernia   . History of hoarseness   . Hypertension    history; resolved on dialysis  . Hypothyroidism   . IgA nephropathy    on peritoneal dialysis  . Immunosuppression ([place])   . Kidney transplanted 01/2010   [facility], has had some rejection  . Patient on peritoneal dialysis ([place])   . Premature atrial beat   . Premature ventricular beat   . Renal transplant failure and rejection 2012  . Shingles 03/2011  . Stroke [facility]) October 2010, March 2013   cerebral aneurysm     Patient Active Problem List   Diagnosis Date Noted  . Chronic cough 09/15/2015  . Paroxysmal atrial fibrillation ([place]) 08/21/2015  . Acute pericarditis 08/05/2015  . Elevated troponin 08/05/2015  . Pain in the chest   . Atypical chest pain 08/04/2015  . LUL cavitary lesion post VATS 2015-cx neg 09/01/2014  . Pulmonary infiltrates 07/05/2014  . Peritoneal dialysis catheter in place March 2015 01/14/2014  . Ventral hernia s/p lap repair w mesh March 2015 01/14/2014  . Secondary hyperparathyroidism (of renal origin) 04/20/2013  . PVC's (premature ventricular contractions) 01/17/2012  . Stroke ([place]) 11/29/2011  . DM type 2 causing complication ([place]) 40/98/1191  . Kidney transplanted   . IgA nephropathy   . GERD 12/07/2007  . Cough variant asthma vs uacs vs components of both  11/09/2007  . Hypothyroidism 10/12/2007  . ANEMIA 10/12/2007  . HTN (hypertension) 10/12/2007  . CARDIAC ARRHYTHMIA 10/12/2007  . Asthma 10/12/2007  . CKD (chronic kidney disease) stage V requiring chronic dialysis ([place]) 10/12/2007    Past Surgical History:  Procedure Laterality Date  . CAPD INSERTION  08/25/2008   open, Dr [name]  . CAPD INSERTION N/A 12/14/2013   Procedure: LAPAROSCOPIC INSERTION CONTINUOUS AMBULATORY PERITONEAL DIALYSIS  (CAPD) CATHETER;  Surgeon: [name], MD;  Location: [place];  Service: General;  Laterality: N/A;  . CAPD REMOVAL  03/22/2010   Dr [name]  . EYE SURGERY Bilateral    radial keratotomy  . INSERTION OF MESH N/A 12/14/2013   Procedure: INSERTION OF MESH;  Surgeon: [name], MD;  Location: [place];  Service: General;  Laterality: N/A;  . KIDNEY TRANSPLANT Right 01/2010   [place]  . LAPAROSCOPIC LYSIS OF ADHESIONS N/A 12/14/2013   Procedure: LAPAROSCOPIC LYSIS OF ADHESIONS;  Surgeon: [name], MD;  Location: [place];  Service: General;  Laterality: N/A;  . UMBILICAL HERNIA REPAIR  10/25/2009   open with mesh,  Dr [name]  . URETER REVISION  04/2011    [facility]  . VENTRAL HERNIA REPAIR N/A 12/14/2013   Procedure: LAPAROSCOPIC VENTRAL HERNIA;  Surgeon: [name], MD;  Location: [place];  Service: General;  Laterality: N/A;  . VIDEO ASSISTED THORACOSCOPY (VATS)/WEDGE RESECTION Left 09/01/2014   Procedure: VIDEO ASSISTED THORACOSCOPY (VATS)/WEDGE RESECTION;  Surgeon: [name], MD;  Location: [place];  Service: Thoracic;  Laterality: Left;  Marland Kitchen VIDEO BRONCHOSCOPY Bilateral 07/05/2014   Procedure: VIDEO BRONCHOSCOPY WITH FLUORO;  Surgeon: [name], MD;  Location: [facility];  Service: Cardiopulmonary;  Laterality: Bilateral;    OB History    No data available       Home Medications    Prior to Admission medications   Medication Sig Start Date End Date Taking? Authorizing Provider  aspirin 81 MG tablet Take 81 mg by mouth daily.    [provider]  BREO ELLIPTA 200-25 MCG/INH AEPB Inhale 1 puff into the lungs every evening. 06/04/17   [provider]  calcitRIOL (ROCALTROL) 0.5 MCG capsule Take 0.5 mcg by mouth daily.     [provider]  chlorpheniramine (CHLOR-TRIMETON) 4 MG tablet Take 4 mg by mouth every 4 (four) hours as needed for allergies.    [provider]  Cholecalciferol (VITAMIN D3) 3000 UNITS TABS Take 3,000 Units by mouth daily.    [provider]  cinacalcet (SENSIPAR) 30 MG tablet Take 30 mg by mouth daily with breakfast.     [provider]  Digestive Enzymes (DIGESTIVE ENZYME PO) Take 1 capsule by mouth daily.    [provider]  epoetin alfa (EPOGEN,PROCRIT) 73710 UNIT/ML injection Inject 10,000 Units into the skin every 30 (thirty) days.     [provider]  ferrous fumarate (HEMOCYTE - 106 MG FE) 325 (106 FE) MG TABS tablet Take 1 tablet by mouth daily.     [provider]  gentamicin cream (GARAMYCIN) 0.1 % Apply 1 application topically daily.  12/21/13   [provider]  lanthanum (FOSRENOL) 1000 MG chewable tablet  Chew 1,000 mg by mouth daily.     [provider]  levothyroxine (SYNTHROID, LEVOTHROID) 50 MCG tablet Take 50 mcg by mouth daily before breakfast.    [provider]  metoCLOPramide (REGLAN) 5 MG tablet Take 5 mg by mouth daily as needed for nausea or vomiting.     [provider]  potassium chloride SA (K-DUR,KLOR-CON) 20 MEQ tablet Take 20 mEq by mouth daily.    [provider]  SPIRIVA RESPIMAT 1.25 MCG/ACT AERS Inhale 2 puffs into the lungs daily. 05/16/17   [provider]    Family History Family History  Problem Relation Age of Onset  . Coronary artery disease Mother   . Emphysema Mother        smoked  . Ovarian cancer Mother   . Heart disease Mother   . Stroke Maternal Uncle   .  Diabetes Maternal Uncle   . Kidney disease Maternal Uncle   . Liver disease Father     Social History Social History   Tobacco Use  . Smoking status: Never Smoker  . Smokeless tobacco: Never Used  Substance Use Topics  . Alcohol use: Yes    Comment: ocassional  . Drug use: No     Allergies   Other; Dapsone; Pregabalin; Sulfonamide derivatives; and Tramadol   Review of Systems Review of Systems 10 Systems reviewed and are negative for acute change except as noted in the HPI.   Physical Exam Updated Vital Signs BP 107/67   Pulse 61   Temp 98.6 F (37 C) (Oral)   Resp 16   SpO2 100%   Physical Exam  Constitutional: She is oriented to person, place, and time. She appears well-developed and well-nourished. No distress.  Patient is alert and appropriately interactive.  No respiratory distress.  Central obesity with some physical deconditioning.  HENT:  Head: Normocephalic and atraumatic.  Right Ear: External ear normal.  Left Ear: External ear normal.  Nose: Nose normal.  Mouth/Throat: Oropharynx is clear and moist.  Eyes: Conjunctivae and EOM are normal. Pupils are equal, round, and reactive to light.  With on dilated funduscopic  exam, I can visualize some retinal vessels on both sides .  No obvious retinal detachment.  Neck: Neck supple.  Cardiovascular: Normal rate, regular rhythm, normal heart sounds and intact distal pulses.  No murmur heard. Pulmonary/Chest: Effort normal and breath sounds normal. No respiratory distress.  Abdominal: Soft. She exhibits distension. There is no tenderness. There is no guarding.  Patient's abdomen is soft.  She does not have pain to palpation.  Abdomen somewhat distended consistent with ascites fluid.  Musculoskeletal: Normal range of motion. She exhibits no edema or tenderness.  No significant peripheral edema or calf tenderness.  Neurological: She is alert and oriented to person, place, and time. No cranial nerve deficit. She exhibits normal muscle tone. Coordination normal.  Skin: Skin is warm and dry.  Psychiatric: She has a normal mood and affect.  Nursing note and vitals reviewed.    ED Treatments / Results  Labs (all labs ordered are listed, but only abnormal results are displayed) Labs Reviewed  CBC - Abnormal; Notable for the following components:      Result Value   RBC 3.49 (*)    Hemoglobin 10.4 (*)    HCT 31.7 (*)    All other components within normal limits  COMPREHENSIVE METABOLIC PANEL - Abnormal; Notable for the following components:   Sodium 133 (*)    Potassium 3.2 (*)    Chloride 94 (*)    BUN 32 (*)    Creatinine, Ser 9.13 (*)    Total Protein 5.0 (*)    Albumin 2.1 (*)    ALT 13 (*)    GFR calc non Af Amer 4 (*)    GFR calc Af Amer 5 (*)    All other components within normal limits  I-STAT CHEM 8, ED - Abnormal; Notable for the following components:   Sodium 134 (*)    Potassium 3.2 (*)    Chloride 92 (*)    BUN 30 (*)    Creatinine, Ser 8.70 (*)    Hemoglobin 10.2 (*)    HCT 30.0 (*)    All other components within normal limits  PROTIME-INR  APTT  DIFFERENTIAL  CBG MONITORING, ED  I-STAT TROPONIN, ED  CBG MONITORING, ED  I-STAT  BETA HCG  BLOOD, ED ([facility], [facility], [facility] ONLY)    EKG  EKG Interpretation  Date/Time:  Friday August 22 2017 14:45:08 EST Ventricular Rate:  66 PR Interval:  176 QRS Duration: 82 QT Interval:  532 QTC Calculation: 557 R Axis:   94 Text Interpretation:  Normal sinus rhythm Rightward axis Cannot rule out Inferior infarct , age undetermined Prolonged QT Abnormal ECG no sig change from previous Confirmed by Charlesetta Shanks [phone]) on 08/22/2017 9:54:59 PM       Radiology Ct Head Wo Contrast  Result Date: 08/22/2017 CLINICAL DATA:  Visual loss. EXAM: CT HEAD WITHOUT CONTRAST TECHNIQUE: Contiguous axial images were obtained from the base of the skull through the vertex without intravenous contrast. COMPARISON:  MRI 07/22/2017 FINDINGS: Brain: Subtle low-density in the right occipital lobe concerning for possible early acute infarction. No hemorrhage. No hydrocephalus. No mass effect or midline shift. Vascular: No hyperdense vessel or unexpected calcification. Skull: No acute calvarial abnormality. Sinuses/Orbits: Visualized paranasal sinuses and mastoids clear. Orbital soft tissues unremarkable. Other: None IMPRESSION: Subtle low-density in the right occipital lobe concerning for acute infarction. This could be further evaluated with MRI if felt clinically indicated. These results will be called to the ordering clinician or representative by the Radiologist Assistant, and communication documented in the PACS or zVision Dashboard. Electronically Signed   By: Rolm Baptise M.D.   On: 08/22/2017 11:05   Mr Brain Wo Contrast (neuro Protocol)  Result Date: 08/22/2017 CLINICAL DATA:  61 y/o F; loss of left peripheral vision in generalized left leg weakness. EXAM: MRI HEAD WITHOUT CONTRAST TECHNIQUE: Multiplanar, multiecho pulse sequences of the brain and surrounding structures were obtained without intravenous contrast. COMPARISON:  08/22/2017 MRI of the head.  08/22/2017 CT of the head. FINDINGS: Brain: 16 mm  focus of reduced diffusion within the right occipital lobe. Mild associated T2 FLAIR hyperintense signal abnormality. No hemorrhage or significant mass effect. Very small chronic infarction of right cerebellar hemisphere. No abnormal susceptibility hypointensity to indicate intracranial hemorrhage. Few nonspecific foci of T2 FLAIR hyperintense signal abnormality in subcortical and periventricular white matter are compatible with mild chronic microvascular ischemic changes for age. Mild brain parenchymal volume loss. No extra-axial collection or effacement of basilar cisterns. Vascular: Normal flow voids. Skull and upper cervical spine: Normal marrow signal. Sinuses/Orbits: Right mastoid effusion. No abnormal signal of paranasal sinuses. Orbits are unremarkable. Other: None. IMPRESSION: 1. Small acute/early subacute infarction in right occipital lobe. No hemorrhage or mass effect. 2. Stable mild chronic microvascular ischemic changes and mild parenchymal volume loss of the brain. These results were called by telephone at the time of interpretation on 08/22/2017 at 7:33 pm to Dr. Charlesetta Shanks , who verbally acknowledged these results. Electronically Signed   By: Kristine Garbe M.D.   On: 08/22/2017 19:35    Procedures Procedures (including critical care time)  Medications Ordered in ED Medications  tetracaine (PONTOCAINE) 0.5 % ophthalmic solution 2 drop (2 drops Both Eyes Not Given 08/22/17 2127)  cyclopentolate (CYCLODRYL,CYCLOGYL) 1 % ophthalmic solution 2 drop (2 drops Both Eyes Not Given 08/22/17 2127)     Initial Impression / Assessment and Plan / ED Course  I have reviewed the triage vital signs and the nursing notes.  Pertinent labs & imaging results that were available during my care of the patient were reviewed by me and considered in my medical decision making (see chart for details).    Consult: Dr. Leonel Ramsay neurology advises for inpatient evaluation.  Advises patient has  increased subsequent stroke  risk and should have expedited diagnostic testing.  Final Clinical Impressions(s) / ED Diagnoses   Final diagnoses:  Cerebral infarction, unspecified mechanism [facility])   Patient presents as outlined above with episode 3 days ago.  MRI does confirm an area of acute\subacute infarct.  She is alert and appropriate.  All movements are coordinated purposeful and symmetric.  She does not have focal motor deficit.  Patient advised that she did not want to stay in the hospital to complete diagnostic evaluation.  She is made aware of risk of creased stroke with poor outcome if embolic source is not identified early.  Patient reports that she did have echo studies done in August 2018 at [facility] and no embolic source was identified.  She reports that the last time she was hospitalized for stroke symptoms she spent the night and nothing more was done.  She reports she wants to go home and complete workup on outpatient basis.  She advises she will return immediately if any additional strokelike symptoms develop.  She is aware of the time sensitive nature of return for assessment. ED Discharge Orders    None       Charlesetta Shanks, MD 08/22/17 2158

## 2017-08-22 NOTE — Discharge Instructions (Signed)
1.  Return immediately if you develop changes in your vision, dizziness, weakness numbness or tingling of extremities, problems with your speech, or other concerning symptoms. 2.  Call your doctor first thing Monday morning to schedule the needed testing to complete your stroke evaluation.  You will need echocardiogram and carotid ultrasound as soon as possible.  Continue your daily aspirin without interruption.

## 2017-08-22 NOTE — ED Notes (Signed)
ED Provider at bedside. 

## 2017-08-23 DIAGNOSIS — D509 Iron deficiency anemia, unspecified: Secondary | ICD-10-CM | POA: Diagnosis not present

## 2017-08-23 DIAGNOSIS — N2581 Secondary hyperparathyroidism of renal origin: Secondary | ICD-10-CM | POA: Diagnosis not present

## 2017-08-23 DIAGNOSIS — N2589 Other disorders resulting from impaired renal tubular function: Secondary | ICD-10-CM | POA: Diagnosis not present

## 2017-08-23 DIAGNOSIS — D631 Anemia in chronic kidney disease: Secondary | ICD-10-CM | POA: Diagnosis not present

## 2017-08-23 DIAGNOSIS — Z79899 Other long term (current) drug therapy: Secondary | ICD-10-CM | POA: Diagnosis not present

## 2017-08-23 DIAGNOSIS — E46 Unspecified protein-calorie malnutrition: Secondary | ICD-10-CM | POA: Diagnosis not present

## 2017-08-23 DIAGNOSIS — N186 End stage renal disease: Secondary | ICD-10-CM | POA: Diagnosis not present

## 2017-08-23 DIAGNOSIS — K769 Liver disease, unspecified: Secondary | ICD-10-CM | POA: Diagnosis not present

## 2017-08-24 DIAGNOSIS — D631 Anemia in chronic kidney disease: Secondary | ICD-10-CM | POA: Diagnosis not present

## 2017-08-24 DIAGNOSIS — N2581 Secondary hyperparathyroidism of renal origin: Secondary | ICD-10-CM | POA: Diagnosis not present

## 2017-08-24 DIAGNOSIS — N186 End stage renal disease: Secondary | ICD-10-CM | POA: Diagnosis not present

## 2017-08-24 DIAGNOSIS — N2589 Other disorders resulting from impaired renal tubular function: Secondary | ICD-10-CM | POA: Diagnosis not present

## 2017-08-24 DIAGNOSIS — Z79899 Other long term (current) drug therapy: Secondary | ICD-10-CM | POA: Diagnosis not present

## 2017-08-24 DIAGNOSIS — D509 Iron deficiency anemia, unspecified: Secondary | ICD-10-CM | POA: Diagnosis not present

## 2017-08-25 ENCOUNTER — Other Ambulatory Visit: Payer: Self-pay | Admitting: Family Medicine

## 2017-08-25 ENCOUNTER — Encounter: Payer: Self-pay | Admitting: Neurology

## 2017-08-25 DIAGNOSIS — I639 Cerebral infarction, unspecified: Secondary | ICD-10-CM

## 2017-08-25 DIAGNOSIS — D509 Iron deficiency anemia, unspecified: Secondary | ICD-10-CM | POA: Diagnosis not present

## 2017-08-25 DIAGNOSIS — H5310 Unspecified subjective visual disturbances: Secondary | ICD-10-CM

## 2017-08-25 DIAGNOSIS — Z79899 Other long term (current) drug therapy: Secondary | ICD-10-CM | POA: Diagnosis not present

## 2017-08-25 DIAGNOSIS — N2581 Secondary hyperparathyroidism of renal origin: Secondary | ICD-10-CM | POA: Diagnosis not present

## 2017-08-25 DIAGNOSIS — H5340 Unspecified visual field defects: Secondary | ICD-10-CM | POA: Diagnosis not present

## 2017-08-25 DIAGNOSIS — N186 End stage renal disease: Secondary | ICD-10-CM | POA: Diagnosis not present

## 2017-08-25 DIAGNOSIS — R930 Abnormal findings on diagnostic imaging of skull and head, not elsewhere classified: Secondary | ICD-10-CM

## 2017-08-25 DIAGNOSIS — D631 Anemia in chronic kidney disease: Secondary | ICD-10-CM | POA: Diagnosis not present

## 2017-08-25 DIAGNOSIS — N2589 Other disorders resulting from impaired renal tubular function: Secondary | ICD-10-CM | POA: Diagnosis not present

## 2017-08-25 DIAGNOSIS — H547 Unspecified visual loss: Secondary | ICD-10-CM

## 2017-08-26 ENCOUNTER — Other Ambulatory Visit: Payer: Self-pay | Admitting: Family Medicine

## 2017-08-26 ENCOUNTER — Ambulatory Visit
Admission: RE | Admit: 2017-08-26 | Discharge: 2017-08-26 | Disposition: A | Discharge status: Home / Self Care | Payer: BLUE CROSS/BLUE SHIELD | Source: Ambulatory Visit | Attending: Family Medicine | Admitting: Family Medicine

## 2017-08-26 DIAGNOSIS — N2581 Secondary hyperparathyroidism of renal origin: Secondary | ICD-10-CM | POA: Diagnosis not present

## 2017-08-26 DIAGNOSIS — N2589 Other disorders resulting from impaired renal tubular function: Secondary | ICD-10-CM | POA: Diagnosis not present

## 2017-08-26 DIAGNOSIS — Z79899 Other long term (current) drug therapy: Secondary | ICD-10-CM | POA: Diagnosis not present

## 2017-08-26 DIAGNOSIS — D509 Iron deficiency anemia, unspecified: Secondary | ICD-10-CM | POA: Diagnosis not present

## 2017-08-26 DIAGNOSIS — N186 End stage renal disease: Secondary | ICD-10-CM | POA: Diagnosis not present

## 2017-08-26 DIAGNOSIS — D631 Anemia in chronic kidney disease: Secondary | ICD-10-CM | POA: Diagnosis not present

## 2017-08-26 DIAGNOSIS — I6523 Occlusion and stenosis of bilateral carotid arteries: Secondary | ICD-10-CM | POA: Diagnosis not present

## 2017-08-26 DIAGNOSIS — I63349 Cerebral infarction due to thrombosis of unspecified cerebellar artery: Secondary | ICD-10-CM

## 2017-08-26 DIAGNOSIS — I639 Cerebral infarction, unspecified: Secondary | ICD-10-CM

## 2017-08-27 DIAGNOSIS — N186 End stage renal disease: Secondary | ICD-10-CM | POA: Diagnosis not present

## 2017-08-27 DIAGNOSIS — D631 Anemia in chronic kidney disease: Secondary | ICD-10-CM | POA: Diagnosis not present

## 2017-08-27 DIAGNOSIS — N2589 Other disorders resulting from impaired renal tubular function: Secondary | ICD-10-CM | POA: Diagnosis not present

## 2017-08-27 DIAGNOSIS — N2581 Secondary hyperparathyroidism of renal origin: Secondary | ICD-10-CM | POA: Diagnosis not present

## 2017-08-27 DIAGNOSIS — Z79899 Other long term (current) drug therapy: Secondary | ICD-10-CM | POA: Diagnosis not present

## 2017-08-27 DIAGNOSIS — R82998 Other abnormal findings in urine: Secondary | ICD-10-CM | POA: Diagnosis not present

## 2017-08-27 DIAGNOSIS — D509 Iron deficiency anemia, unspecified: Secondary | ICD-10-CM | POA: Diagnosis not present

## 2017-08-28 ENCOUNTER — Ambulatory Visit (HOSPITAL_COMMUNITY): Payer: Medicare Other | Attending: Cardiovascular Disease

## 2017-08-28 ENCOUNTER — Other Ambulatory Visit: Payer: Medicare Other

## 2017-08-28 ENCOUNTER — Other Ambulatory Visit: Payer: Self-pay

## 2017-08-28 DIAGNOSIS — D631 Anemia in chronic kidney disease: Secondary | ICD-10-CM | POA: Diagnosis not present

## 2017-08-28 DIAGNOSIS — I639 Cerebral infarction, unspecified: Secondary | ICD-10-CM | POA: Diagnosis not present

## 2017-08-28 DIAGNOSIS — E119 Type 2 diabetes mellitus without complications: Secondary | ICD-10-CM | POA: Diagnosis not present

## 2017-08-28 DIAGNOSIS — N2581 Secondary hyperparathyroidism of renal origin: Secondary | ICD-10-CM | POA: Diagnosis not present

## 2017-08-28 DIAGNOSIS — R011 Cardiac murmur, unspecified: Secondary | ICD-10-CM | POA: Diagnosis not present

## 2017-08-28 DIAGNOSIS — Z79899 Other long term (current) drug therapy: Secondary | ICD-10-CM | POA: Diagnosis not present

## 2017-08-28 DIAGNOSIS — I63349 Cerebral infarction due to thrombosis of unspecified cerebellar artery: Secondary | ICD-10-CM

## 2017-08-28 DIAGNOSIS — N2589 Other disorders resulting from impaired renal tubular function: Secondary | ICD-10-CM | POA: Diagnosis not present

## 2017-08-28 DIAGNOSIS — N186 End stage renal disease: Secondary | ICD-10-CM | POA: Diagnosis not present

## 2017-08-28 DIAGNOSIS — D509 Iron deficiency anemia, unspecified: Secondary | ICD-10-CM | POA: Diagnosis not present

## 2017-08-28 DIAGNOSIS — I10 Essential (primary) hypertension: Secondary | ICD-10-CM | POA: Insufficient documentation

## 2017-08-29 DIAGNOSIS — N186 End stage renal disease: Secondary | ICD-10-CM | POA: Diagnosis not present

## 2017-08-29 DIAGNOSIS — D509 Iron deficiency anemia, unspecified: Secondary | ICD-10-CM | POA: Diagnosis not present

## 2017-08-29 DIAGNOSIS — N2589 Other disorders resulting from impaired renal tubular function: Secondary | ICD-10-CM | POA: Diagnosis not present

## 2017-08-29 DIAGNOSIS — D631 Anemia in chronic kidney disease: Secondary | ICD-10-CM | POA: Diagnosis not present

## 2017-08-29 DIAGNOSIS — N2581 Secondary hyperparathyroidism of renal origin: Secondary | ICD-10-CM | POA: Diagnosis not present

## 2017-08-29 DIAGNOSIS — Z79899 Other long term (current) drug therapy: Secondary | ICD-10-CM | POA: Diagnosis not present

## 2017-08-30 DIAGNOSIS — N186 End stage renal disease: Secondary | ICD-10-CM | POA: Diagnosis not present

## 2017-08-30 DIAGNOSIS — D509 Iron deficiency anemia, unspecified: Secondary | ICD-10-CM | POA: Diagnosis not present

## 2017-08-30 DIAGNOSIS — D631 Anemia in chronic kidney disease: Secondary | ICD-10-CM | POA: Diagnosis not present

## 2017-08-30 DIAGNOSIS — N2581 Secondary hyperparathyroidism of renal origin: Secondary | ICD-10-CM | POA: Diagnosis not present

## 2017-08-30 DIAGNOSIS — Z79899 Other long term (current) drug therapy: Secondary | ICD-10-CM | POA: Diagnosis not present

## 2017-08-30 DIAGNOSIS — N2589 Other disorders resulting from impaired renal tubular function: Secondary | ICD-10-CM | POA: Diagnosis not present

## 2017-08-31 DIAGNOSIS — N2581 Secondary hyperparathyroidism of renal origin: Secondary | ICD-10-CM | POA: Diagnosis not present

## 2017-08-31 DIAGNOSIS — D509 Iron deficiency anemia, unspecified: Secondary | ICD-10-CM | POA: Diagnosis not present

## 2017-08-31 DIAGNOSIS — N186 End stage renal disease: Secondary | ICD-10-CM | POA: Diagnosis not present

## 2017-08-31 DIAGNOSIS — Z79899 Other long term (current) drug therapy: Secondary | ICD-10-CM | POA: Diagnosis not present

## 2017-08-31 DIAGNOSIS — N2589 Other disorders resulting from impaired renal tubular function: Secondary | ICD-10-CM | POA: Diagnosis not present

## 2017-08-31 DIAGNOSIS — D631 Anemia in chronic kidney disease: Secondary | ICD-10-CM | POA: Diagnosis not present

## 2017-09-01 DIAGNOSIS — Z79899 Other long term (current) drug therapy: Secondary | ICD-10-CM | POA: Diagnosis not present

## 2017-09-01 DIAGNOSIS — N2581 Secondary hyperparathyroidism of renal origin: Secondary | ICD-10-CM | POA: Diagnosis not present

## 2017-09-01 DIAGNOSIS — D509 Iron deficiency anemia, unspecified: Secondary | ICD-10-CM | POA: Diagnosis not present

## 2017-09-01 DIAGNOSIS — N2589 Other disorders resulting from impaired renal tubular function: Secondary | ICD-10-CM | POA: Diagnosis not present

## 2017-09-01 DIAGNOSIS — N186 End stage renal disease: Secondary | ICD-10-CM | POA: Diagnosis not present

## 2017-09-01 DIAGNOSIS — D631 Anemia in chronic kidney disease: Secondary | ICD-10-CM | POA: Diagnosis not present

## 2017-09-02 DIAGNOSIS — Z79899 Other long term (current) drug therapy: Secondary | ICD-10-CM | POA: Diagnosis not present

## 2017-09-02 DIAGNOSIS — N186 End stage renal disease: Secondary | ICD-10-CM | POA: Diagnosis not present

## 2017-09-02 DIAGNOSIS — D509 Iron deficiency anemia, unspecified: Secondary | ICD-10-CM | POA: Diagnosis not present

## 2017-09-02 DIAGNOSIS — D631 Anemia in chronic kidney disease: Secondary | ICD-10-CM | POA: Diagnosis not present

## 2017-09-02 DIAGNOSIS — N2589 Other disorders resulting from impaired renal tubular function: Secondary | ICD-10-CM | POA: Diagnosis not present

## 2017-09-02 DIAGNOSIS — N2581 Secondary hyperparathyroidism of renal origin: Secondary | ICD-10-CM | POA: Diagnosis not present

## 2017-09-03 DIAGNOSIS — D631 Anemia in chronic kidney disease: Secondary | ICD-10-CM | POA: Diagnosis not present

## 2017-09-03 DIAGNOSIS — N2581 Secondary hyperparathyroidism of renal origin: Secondary | ICD-10-CM | POA: Diagnosis not present

## 2017-09-03 DIAGNOSIS — N2589 Other disorders resulting from impaired renal tubular function: Secondary | ICD-10-CM | POA: Diagnosis not present

## 2017-09-03 DIAGNOSIS — D509 Iron deficiency anemia, unspecified: Secondary | ICD-10-CM | POA: Diagnosis not present

## 2017-09-03 DIAGNOSIS — N186 End stage renal disease: Secondary | ICD-10-CM | POA: Diagnosis not present

## 2017-09-03 DIAGNOSIS — Z79899 Other long term (current) drug therapy: Secondary | ICD-10-CM | POA: Diagnosis not present

## 2017-09-04 DIAGNOSIS — N2589 Other disorders resulting from impaired renal tubular function: Secondary | ICD-10-CM | POA: Diagnosis not present

## 2017-09-04 DIAGNOSIS — D631 Anemia in chronic kidney disease: Secondary | ICD-10-CM | POA: Diagnosis not present

## 2017-09-04 DIAGNOSIS — N2581 Secondary hyperparathyroidism of renal origin: Secondary | ICD-10-CM | POA: Diagnosis not present

## 2017-09-04 DIAGNOSIS — N186 End stage renal disease: Secondary | ICD-10-CM | POA: Diagnosis not present

## 2017-09-04 DIAGNOSIS — D509 Iron deficiency anemia, unspecified: Secondary | ICD-10-CM | POA: Diagnosis not present

## 2017-09-04 DIAGNOSIS — Z79899 Other long term (current) drug therapy: Secondary | ICD-10-CM | POA: Diagnosis not present

## 2017-09-05 DIAGNOSIS — N2581 Secondary hyperparathyroidism of renal origin: Secondary | ICD-10-CM | POA: Diagnosis not present

## 2017-09-05 DIAGNOSIS — N186 End stage renal disease: Secondary | ICD-10-CM | POA: Diagnosis not present

## 2017-09-05 DIAGNOSIS — N2589 Other disorders resulting from impaired renal tubular function: Secondary | ICD-10-CM | POA: Diagnosis not present

## 2017-09-05 DIAGNOSIS — Z1231 Encounter for screening mammogram for malignant neoplasm of breast: Secondary | ICD-10-CM | POA: Diagnosis not present

## 2017-09-05 DIAGNOSIS — D631 Anemia in chronic kidney disease: Secondary | ICD-10-CM | POA: Diagnosis not present

## 2017-09-05 DIAGNOSIS — Z79899 Other long term (current) drug therapy: Secondary | ICD-10-CM | POA: Diagnosis not present

## 2017-09-05 DIAGNOSIS — D509 Iron deficiency anemia, unspecified: Secondary | ICD-10-CM | POA: Diagnosis not present

## 2017-09-06 DIAGNOSIS — Z79899 Other long term (current) drug therapy: Secondary | ICD-10-CM | POA: Diagnosis not present

## 2017-09-06 DIAGNOSIS — D631 Anemia in chronic kidney disease: Secondary | ICD-10-CM | POA: Diagnosis not present

## 2017-09-06 DIAGNOSIS — N2581 Secondary hyperparathyroidism of renal origin: Secondary | ICD-10-CM | POA: Diagnosis not present

## 2017-09-06 DIAGNOSIS — N2589 Other disorders resulting from impaired renal tubular function: Secondary | ICD-10-CM | POA: Diagnosis not present

## 2017-09-06 DIAGNOSIS — N186 End stage renal disease: Secondary | ICD-10-CM | POA: Diagnosis not present

## 2017-09-06 DIAGNOSIS — D509 Iron deficiency anemia, unspecified: Secondary | ICD-10-CM | POA: Diagnosis not present

## 2017-09-07 DIAGNOSIS — D631 Anemia in chronic kidney disease: Secondary | ICD-10-CM | POA: Diagnosis not present

## 2017-09-07 DIAGNOSIS — Z79899 Other long term (current) drug therapy: Secondary | ICD-10-CM | POA: Diagnosis not present

## 2017-09-07 DIAGNOSIS — N2589 Other disorders resulting from impaired renal tubular function: Secondary | ICD-10-CM | POA: Diagnosis not present

## 2017-09-07 DIAGNOSIS — N186 End stage renal disease: Secondary | ICD-10-CM | POA: Diagnosis not present

## 2017-09-07 DIAGNOSIS — D509 Iron deficiency anemia, unspecified: Secondary | ICD-10-CM | POA: Diagnosis not present

## 2017-09-07 DIAGNOSIS — N2581 Secondary hyperparathyroidism of renal origin: Secondary | ICD-10-CM | POA: Diagnosis not present

## 2017-09-08 DIAGNOSIS — D509 Iron deficiency anemia, unspecified: Secondary | ICD-10-CM | POA: Diagnosis not present

## 2017-09-08 DIAGNOSIS — N2589 Other disorders resulting from impaired renal tubular function: Secondary | ICD-10-CM | POA: Diagnosis not present

## 2017-09-08 DIAGNOSIS — N186 End stage renal disease: Secondary | ICD-10-CM | POA: Diagnosis not present

## 2017-09-08 DIAGNOSIS — N2581 Secondary hyperparathyroidism of renal origin: Secondary | ICD-10-CM | POA: Diagnosis not present

## 2017-09-08 DIAGNOSIS — Z79899 Other long term (current) drug therapy: Secondary | ICD-10-CM | POA: Diagnosis not present

## 2017-09-08 DIAGNOSIS — D631 Anemia in chronic kidney disease: Secondary | ICD-10-CM | POA: Diagnosis not present

## 2017-09-09 DIAGNOSIS — N2589 Other disorders resulting from impaired renal tubular function: Secondary | ICD-10-CM | POA: Diagnosis not present

## 2017-09-09 DIAGNOSIS — Z79899 Other long term (current) drug therapy: Secondary | ICD-10-CM | POA: Diagnosis not present

## 2017-09-09 DIAGNOSIS — D631 Anemia in chronic kidney disease: Secondary | ICD-10-CM | POA: Diagnosis not present

## 2017-09-09 DIAGNOSIS — N186 End stage renal disease: Secondary | ICD-10-CM | POA: Diagnosis not present

## 2017-09-09 DIAGNOSIS — N2581 Secondary hyperparathyroidism of renal origin: Secondary | ICD-10-CM | POA: Diagnosis not present

## 2017-09-09 DIAGNOSIS — D509 Iron deficiency anemia, unspecified: Secondary | ICD-10-CM | POA: Diagnosis not present

## 2017-09-10 DIAGNOSIS — Z79899 Other long term (current) drug therapy: Secondary | ICD-10-CM | POA: Diagnosis not present

## 2017-09-10 DIAGNOSIS — D509 Iron deficiency anemia, unspecified: Secondary | ICD-10-CM | POA: Diagnosis not present

## 2017-09-10 DIAGNOSIS — N2589 Other disorders resulting from impaired renal tubular function: Secondary | ICD-10-CM | POA: Diagnosis not present

## 2017-09-10 DIAGNOSIS — N2581 Secondary hyperparathyroidism of renal origin: Secondary | ICD-10-CM | POA: Diagnosis not present

## 2017-09-10 DIAGNOSIS — N186 End stage renal disease: Secondary | ICD-10-CM | POA: Diagnosis not present

## 2017-09-10 DIAGNOSIS — D631 Anemia in chronic kidney disease: Secondary | ICD-10-CM | POA: Diagnosis not present

## 2017-09-11 DIAGNOSIS — N186 End stage renal disease: Secondary | ICD-10-CM | POA: Diagnosis not present

## 2017-09-11 DIAGNOSIS — D509 Iron deficiency anemia, unspecified: Secondary | ICD-10-CM | POA: Diagnosis not present

## 2017-09-11 DIAGNOSIS — Z79899 Other long term (current) drug therapy: Secondary | ICD-10-CM | POA: Diagnosis not present

## 2017-09-11 DIAGNOSIS — D631 Anemia in chronic kidney disease: Secondary | ICD-10-CM | POA: Diagnosis not present

## 2017-09-11 DIAGNOSIS — N2589 Other disorders resulting from impaired renal tubular function: Secondary | ICD-10-CM | POA: Diagnosis not present

## 2017-09-11 DIAGNOSIS — N2581 Secondary hyperparathyroidism of renal origin: Secondary | ICD-10-CM | POA: Diagnosis not present

## 2017-09-12 DIAGNOSIS — Z79899 Other long term (current) drug therapy: Secondary | ICD-10-CM | POA: Diagnosis not present

## 2017-09-12 DIAGNOSIS — D631 Anemia in chronic kidney disease: Secondary | ICD-10-CM | POA: Diagnosis not present

## 2017-09-12 DIAGNOSIS — N2581 Secondary hyperparathyroidism of renal origin: Secondary | ICD-10-CM | POA: Diagnosis not present

## 2017-09-12 DIAGNOSIS — N2589 Other disorders resulting from impaired renal tubular function: Secondary | ICD-10-CM | POA: Diagnosis not present

## 2017-09-12 DIAGNOSIS — N186 End stage renal disease: Secondary | ICD-10-CM | POA: Diagnosis not present

## 2017-09-12 DIAGNOSIS — D509 Iron deficiency anemia, unspecified: Secondary | ICD-10-CM | POA: Diagnosis not present

## 2017-09-13 DIAGNOSIS — N2581 Secondary hyperparathyroidism of renal origin: Secondary | ICD-10-CM | POA: Diagnosis not present

## 2017-09-13 DIAGNOSIS — Z79899 Other long term (current) drug therapy: Secondary | ICD-10-CM | POA: Diagnosis not present

## 2017-09-13 DIAGNOSIS — N2589 Other disorders resulting from impaired renal tubular function: Secondary | ICD-10-CM | POA: Diagnosis not present

## 2017-09-13 DIAGNOSIS — D631 Anemia in chronic kidney disease: Secondary | ICD-10-CM | POA: Diagnosis not present

## 2017-09-13 DIAGNOSIS — N186 End stage renal disease: Secondary | ICD-10-CM | POA: Diagnosis not present

## 2017-09-13 DIAGNOSIS — D509 Iron deficiency anemia, unspecified: Secondary | ICD-10-CM | POA: Diagnosis not present

## 2017-09-14 DIAGNOSIS — N186 End stage renal disease: Secondary | ICD-10-CM | POA: Diagnosis not present

## 2017-09-14 DIAGNOSIS — N2589 Other disorders resulting from impaired renal tubular function: Secondary | ICD-10-CM | POA: Diagnosis not present

## 2017-09-14 DIAGNOSIS — D509 Iron deficiency anemia, unspecified: Secondary | ICD-10-CM | POA: Diagnosis not present

## 2017-09-14 DIAGNOSIS — N2581 Secondary hyperparathyroidism of renal origin: Secondary | ICD-10-CM | POA: Diagnosis not present

## 2017-09-14 DIAGNOSIS — Z79899 Other long term (current) drug therapy: Secondary | ICD-10-CM | POA: Diagnosis not present

## 2017-09-14 DIAGNOSIS — D631 Anemia in chronic kidney disease: Secondary | ICD-10-CM | POA: Diagnosis not present

## 2017-09-15 DIAGNOSIS — Z79899 Other long term (current) drug therapy: Secondary | ICD-10-CM | POA: Diagnosis not present

## 2017-09-15 DIAGNOSIS — D509 Iron deficiency anemia, unspecified: Secondary | ICD-10-CM | POA: Diagnosis not present

## 2017-09-15 DIAGNOSIS — D631 Anemia in chronic kidney disease: Secondary | ICD-10-CM | POA: Diagnosis not present

## 2017-09-15 DIAGNOSIS — N186 End stage renal disease: Secondary | ICD-10-CM | POA: Diagnosis not present

## 2017-09-15 DIAGNOSIS — N2581 Secondary hyperparathyroidism of renal origin: Secondary | ICD-10-CM | POA: Diagnosis not present

## 2017-09-15 DIAGNOSIS — N2589 Other disorders resulting from impaired renal tubular function: Secondary | ICD-10-CM | POA: Diagnosis not present

## 2017-09-16 DIAGNOSIS — N2589 Other disorders resulting from impaired renal tubular function: Secondary | ICD-10-CM | POA: Diagnosis not present

## 2017-09-16 DIAGNOSIS — N2581 Secondary hyperparathyroidism of renal origin: Secondary | ICD-10-CM | POA: Diagnosis not present

## 2017-09-16 DIAGNOSIS — D509 Iron deficiency anemia, unspecified: Secondary | ICD-10-CM | POA: Diagnosis not present

## 2017-09-16 DIAGNOSIS — N186 End stage renal disease: Secondary | ICD-10-CM | POA: Diagnosis not present

## 2017-09-16 DIAGNOSIS — D631 Anemia in chronic kidney disease: Secondary | ICD-10-CM | POA: Diagnosis not present

## 2017-09-16 DIAGNOSIS — Z79899 Other long term (current) drug therapy: Secondary | ICD-10-CM | POA: Diagnosis not present

## 2017-09-17 DIAGNOSIS — N2589 Other disorders resulting from impaired renal tubular function: Secondary | ICD-10-CM | POA: Diagnosis not present

## 2017-09-17 DIAGNOSIS — D631 Anemia in chronic kidney disease: Secondary | ICD-10-CM | POA: Diagnosis not present

## 2017-09-17 DIAGNOSIS — Z79899 Other long term (current) drug therapy: Secondary | ICD-10-CM | POA: Diagnosis not present

## 2017-09-17 DIAGNOSIS — N186 End stage renal disease: Secondary | ICD-10-CM | POA: Diagnosis not present

## 2017-09-17 DIAGNOSIS — D509 Iron deficiency anemia, unspecified: Secondary | ICD-10-CM | POA: Diagnosis not present

## 2017-09-17 DIAGNOSIS — N2581 Secondary hyperparathyroidism of renal origin: Secondary | ICD-10-CM | POA: Diagnosis not present

## 2017-09-18 ENCOUNTER — Ambulatory Visit: Payer: Medicare Other | Admitting: Neurology

## 2017-09-18 ENCOUNTER — Ambulatory Visit (INDEPENDENT_AMBULATORY_CARE_PROVIDER_SITE_OTHER): Payer: Medicare Other | Admitting: Neurology

## 2017-09-18 ENCOUNTER — Encounter: Payer: Self-pay | Admitting: Neurology

## 2017-09-18 VITALS — BP 114/75 | HR 83 | Ht 65.0 in | Wt 141.0 lb

## 2017-09-18 DIAGNOSIS — I671 Cerebral aneurysm, nonruptured: Secondary | ICD-10-CM | POA: Diagnosis not present

## 2017-09-18 DIAGNOSIS — Z79899 Other long term (current) drug therapy: Secondary | ICD-10-CM | POA: Diagnosis not present

## 2017-09-18 DIAGNOSIS — R5383 Other fatigue: Secondary | ICD-10-CM | POA: Diagnosis not present

## 2017-09-18 DIAGNOSIS — I639 Cerebral infarction, unspecified: Secondary | ICD-10-CM | POA: Diagnosis not present

## 2017-09-18 DIAGNOSIS — R0683 Snoring: Secondary | ICD-10-CM | POA: Diagnosis not present

## 2017-09-18 DIAGNOSIS — N2581 Secondary hyperparathyroidism of renal origin: Secondary | ICD-10-CM | POA: Diagnosis not present

## 2017-09-18 DIAGNOSIS — D631 Anemia in chronic kidney disease: Secondary | ICD-10-CM | POA: Diagnosis not present

## 2017-09-18 DIAGNOSIS — D509 Iron deficiency anemia, unspecified: Secondary | ICD-10-CM | POA: Diagnosis not present

## 2017-09-18 DIAGNOSIS — N186 End stage renal disease: Secondary | ICD-10-CM | POA: Diagnosis not present

## 2017-09-18 DIAGNOSIS — N2589 Other disorders resulting from impaired renal tubular function: Secondary | ICD-10-CM | POA: Diagnosis not present

## 2017-09-18 MED ORDER — ASPIRIN EC 325 MG PO TBEC: 325.0000 mg | DELAYED_RELEASE_TABLET | Freq: Every day | ORAL | 0 refills | Status: DC

## 2017-09-18 NOTE — Progress Notes (Addendum)
[facility]    Provider:  Dr Jaynee Eagles Referring Provider: [name], MD Primary Care Physician:  [name], MD  CC:  Follow up on stroke  HPI:  Kimberly [name] is a 61 y.o. female here as a referral from Dr. Tamala Julian for Follow up on stroke.  Past medical history anemia, asthma, diabetes on peritoneal dialysis, diverticulosis, first-degree AV block, chronic cough, hypertension, hypothyroidism, IgA nephropathy, kidney transplant, renal transplant failure rejection, stroke. She has had multiple strokes, the most recent was in November, her vision went "wonky" and she went to the ED, no inciting events, no illnesses, no trauma, no pain. she still has some loss of peripheral vision left side, her leg feels numb and heavy. She had stopped Aspirin at the time due to procedure. She has had 2 other strokes in the past. She also has an aneurysm. No other focal neurologic deficits, associated symptoms, inciting events or modifiable factors.  Reviewed notes, labs and imaging from outside physicians, which showed:  Reviewed emergency room notes, patient was seen in November after she developed decreased peripheral vision loss for 3 days.  Also generalized weakness in the legs.  Patient was sent to the emergency room because CT showed a possible infarct area.  She does home peritoneal dialysis.  She has had numbness in her left hand in the past.  Variable symptoms of weakness and numbness.  MRI brain:   Personally reviewed images and agree with the following:  1. Small acute/early subacute infarction in right occipital lobe. No hemorrhage or mass effect. 2. Stable mild chronic microvascular ischemic changes and mild parenchymal volume loss of the brain.  Carotid dopplers 08/2017:  Color duplex indicates minimal heterogeneous plaque, with no hemodynamically significant stenosis by duplex criteria in the extracranial cerebrovascular circulation.  Echo 08/2017:   - Left  ventricle: The cavity size was normal. Systolic function was   normal. The estimated ejection fraction was in the range of 60%   to 65%. Wall motion was normal; there were no regional wall   motion abnormalities. Left ventricular diastolic function   parameters were normal. - Mitral valve: Valve area by pressure half-time: 1.86 cm^2. - Right ventricle: RV appears hypertrophied. - Atrial septum: No defect or patent foramen ovale was identified.  Review of Systems: Patient complains of symptoms per HPI as well as the following symptoms: too much sleep, decreased energy, change in appetite, loss of vision, fatigue, snoring. Pertinent negatives and positives per HPI. All others negative.   Social History   Socioeconomic History  . Marital status: Married    Spouse name: Not on file  . Number of children: 1  . Years of education: Not on file  . Highest education level: Not on file  Social Needs  . Financial resource strain: Not on file  . Food insecurity - worry: Not on file  . Food insecurity - inability: Not on file  . Transportation needs - medical: Not on file  . Transportation needs - non-medical: Not on file  Occupational History  . Occupation: retired  Tobacco Use  . Smoking status: Never Smoker  . Smokeless tobacco: Never Used  Substance and Sexual Activity  . Alcohol use: Yes    Comment: ocassional  . Drug use: No  . Sexual activity: Yes    Birth control/protection: Post-menopausal  Other Topics Concern  . Not on file  Social History Narrative   Pt lives in [place].  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  Family History  Problem Relation Age of Onset  . Coronary artery disease Mother   . Emphysema Mother        smoked  . Ovarian cancer Mother   . Heart disease Mother   . Stroke Maternal Uncle   . Diabetes Maternal Uncle   . Kidney disease Maternal Uncle   . Liver disease Father     Past Medical History:  Diagnosis Date  .  Anemia   . Asthma   . Diabetes mellitus    medication induced with  steroids  . Diverticulosis   . First degree AV block   . GERD (gastroesophageal reflux disease)   . Heart murmur    child  . History of chronic cough   . History of hiatal hernia   . History of hoarseness   . Hypertension    history; resolved on dialysis  . Hypothyroidism   . IgA nephropathy    on peritoneal dialysis  . Immunosuppression ([place])   . Kidney transplanted 01/2010   [facility], has had some rejection  . Patient on peritoneal dialysis ([place])   . Premature atrial beat   . Premature ventricular beat   . Renal transplant failure and rejection 2012  . Shingles 03/2011  . Stroke [facility]) October 2010, March 2013   cerebral aneurysm    Past Surgical History:  Procedure Laterality Date  . CAPD INSERTION  08/25/2008   open, Dr [name]  . CAPD INSERTION N/A 12/14/2013   Procedure: LAPAROSCOPIC INSERTION CONTINUOUS AMBULATORY PERITONEAL DIALYSIS  (CAPD) CATHETER;  Surgeon: [name], MD;  Location: [place];  Service: General;  Laterality: N/A;  . CAPD REMOVAL  03/22/2010   Dr [name]  . EYE SURGERY Bilateral    radial keratotomy  . INSERTION OF MESH N/A 12/14/2013   Procedure: INSERTION OF MESH;  Surgeon: [name], MD;  Location: [place];  Service: General;  Laterality: N/A;  . KIDNEY TRANSPLANT Right 01/2010   [place]  . LAPAROSCOPIC LYSIS OF ADHESIONS N/A 12/14/2013   Procedure: LAPAROSCOPIC LYSIS OF ADHESIONS;  Surgeon: [name], MD;  Location: [place];  Service: General;  Laterality: N/A;  . UMBILICAL HERNIA REPAIR  10/25/2009   open with mesh,  Dr [name]  . URETER REVISION  04/2011   [facility]  . VENTRAL HERNIA REPAIR N/A 12/14/2013   Procedure: LAPAROSCOPIC VENTRAL HERNIA;  Surgeon: [name], MD;  Location: [place];  Service: General;  Laterality: N/A;  . VIDEO ASSISTED THORACOSCOPY (VATS)/WEDGE RESECTION Left 09/01/2014   Procedure: VIDEO ASSISTED THORACOSCOPY (VATS)/WEDGE RESECTION;  Surgeon:  [name], MD;  Location: [place];  Service: Thoracic;  Laterality: Left;  Marland Kitchen VIDEO BRONCHOSCOPY Bilateral 07/05/2014   Procedure: VIDEO BRONCHOSCOPY WITH FLUORO;  Surgeon: [name], MD;  Location: [facility];  Service: Cardiopulmonary;  Laterality: Bilateral;    Current Outpatient Medications  Medication Sig Dispense Refill  . BREO ELLIPTA 200-25 MCG/INH AEPB Inhale 1 puff into the lungs every evening.    . calcitRIOL (ROCALTROL) 0.5 MCG capsule Take 0.5 mcg by mouth 3 (three) times a week.     . chlorpheniramine (CHLOR-TRIMETON) 4 MG tablet Take 4 mg by mouth every 4 (four) hours as needed for allergies.    . Cholecalciferol (VITAMIN D3) 3000 UNITS TABS Take 3,000 Units by mouth daily.    . cinacalcet (SENSIPAR) 30 MG tablet Take 30 mg by mouth daily with breakfast.     . Digestive Enzymes (DIGESTIVE ENZYME PO) Take 1 capsule by  mouth daily.    Marland Kitchen epoetin alfa (EPOGEN,PROCRIT) 56387 UNIT/ML injection Inject 10,000 Units into the skin every 30 (thirty) days.     . ferrous fumarate (HEMOCYTE - 106 MG FE) 325 (106 FE) MG TABS tablet Take 1 tablet by mouth daily.     Marland Kitchen gentamicin cream (GARAMYCIN) 0.1 % Apply 1 application topically daily.     Marland Kitchen lanthanum (FOSRENOL) 1000 MG chewable tablet Chew 1,000 mg by mouth daily.     Marland Kitchen levothyroxine (SYNTHROID, LEVOTHROID) 50 MCG tablet Take 50 mcg by mouth daily before breakfast. 34mcg on Sun, Mon, Wed, Fri and Sat. 19mcg on Tues and Thurs.    . metoCLOPramide (REGLAN) 5 MG tablet Take 5 mg by mouth daily as needed for nausea or vomiting.     . potassium chloride SA (K-DUR,KLOR-CON) 20 MEQ tablet Take 20 mEq by mouth daily.    Marland Kitchen SPIRIVA RESPIMAT 1.25 MCG/ACT AERS Inhale 2 puffs into the lungs daily.    Marland Kitchen aspirin EC 325 MG tablet Take 1 tablet (325 mg total) by mouth daily. 30 tablet 0   No current facility-administered medications for this visit.     Allergies as of 09/18/2017 - Review Complete 09/18/2017  Allergen Reaction Noted  .  Other  08/21/2015  . Dapsone Rash 11/29/2011  . Pregabalin Rash and Other (See Comments) 01/17/2012  . Sulfonamide derivatives Rash   . Tramadol Rash 01/14/2014    Vitals: BP 114/75   Pulse 83   Ht 5\' 5"  (1.651 m)   Wt 141 lb (64 kg)   BMI 23.46 kg/m  Last Weight:  Wt Readings from Last 1 Encounters:  09/18/17 141 lb (64 kg)   Last Height:   Ht Readings from Last 1 Encounters:  09/18/17 5\' 5"  (1.651 m)    Physical exam: Exam: Gen: NAD, conversant, well nourised, well groomed                     CV: RRR, no MRG. No Carotid Bruits. No peripheral edema, warm, nontender Eyes: Conjunctivae clear without exudates or hemorrhage  Neuro: Detailed Neurologic Exam  Speech:    Speech is normal; fluent and spontaneous with normal comprehension.  Cognition:    The patient is oriented to person, place, and time;     recent and remote memory intact;     language fluent;     normal attention, concentration,     fund of knowledge Cranial Nerves:    The pupils are equal, round, and reactive to light.  Attempted fundoscopic exam could not visualize. Visual fields are full to finger confrontation. Extraocular movements are intact. Trigeminal sensation is intact and the muscles of mastication are normal. The face is symmetric. The palate elevates in the midline. Hearing intact. Voice is normal. Shoulder shrug is normal. The tongue has normal motion without fasciculations.   Coordination:    Normal finger to nose and heel to shin. Normal rapid alternating movements.   Gait:    No ataxia  Motor Observation:    No asymmetry, no atrophy, and no involuntary movements noted. Tone:    Normal muscle tone.    Posture:    Posture is normal. normal erect    Strength:    Strength is V/V in the upper and lower limbs.      Sensation: intact to LT     Reflex Exam:  DTR's:    Deep tendon reflexes in the upper and lower extremities are brisk bilaterally.   Toes:  The toes are downgoing  bilaterally.   Clonus:    Clonus is absent.      Assessment/Plan:  61 year old with recent embolic stroke of undetermined etiology, needs TEE and loop.   Needs TEE and loop recorder as patient has had multiple strokes, most recent appears embolic. Referral to Dr. Rayann Heman.   Declines statin therapy for LDL, follow up with pcp, checks it every 3 months, goal is < 70  She had stopped Aspirin 81mg  at the time due to procedure, not technically a failure. Aspirin 325mg  for stroke prevention.  MRA of the head to follow aneurysm and other causes of embolic stroke  She snores, very tired during the day, embolic stroke. Sleep study.  I had a long d/w patient about her recent stroke, risk for recurrent stroke/TIAs, personally independently reviewed imaging studies and stroke evaluation results and answered questions.Continue ASA 325mg  for secondary stroke prevention and maintain strict control of hypertension with blood pressure goal below 130/90, diabetes with hemoglobin A1c goal below 6.5% and lipids with LDL cholesterol goal below 70 mg/dL.  Discuss Epogen with prescribing physician, medications can cause increased risk of stroke  Orders Placed This Encounter  Procedures  . MR MRA HEAD WO CONTRAST  . Ambulatory referral to Sleep Studies  . Ambulatory referral to Cardiac Electrophysiology     Sarina Ill, MD  [facility] [address] [place], [place] [address]  Phone [phone] Fax [phone]

## 2017-09-18 NOTE — Patient Instructions (Addendum)
Follow up with Dr. Joylene Grapes for Transthoracic Echocardiogram and Loop recorder MRA of the head Aspirin 325mg  Sleep study   Atrial Fibrillation Atrial fibrillation is a type of irregular or rapid heartbeat (arrhythmia). In atrial fibrillation, the heart quivers continuously in a chaotic pattern. This occurs when parts of the heart receive disorganized signals that make the heart unable to pump blood normally. This can increase the risk for stroke, heart failure, and other heart-related conditions. There are different types of atrial fibrillation, including:  Paroxysmal atrial fibrillation. This type starts suddenly, and it usually stops on its own shortly after it starts.  Persistent atrial fibrillation. This type often lasts longer than a week. It may stop on its own or with treatment.  Long-lasting persistent atrial fibrillation. This type lasts longer than 12 months.  Permanent atrial fibrillation. This type does not go away.  Talk with your health care provider to learn about the type of atrial fibrillation that you have. What are the causes? This condition is caused by some heart-related conditions or procedures, including:  A heart attack.  Coronary artery disease.  Heart failure.  Heart valve conditions.  High blood pressure.  Inflammation of the sac that surrounds the heart (pericarditis).  Heart surgery.  Certain heart rhythm disorders, such as Wolf-Parkinson-White syndrome.  Other causes include:  Pneumonia.  Obstructive sleep apnea.  Blockage of an artery in the lungs (pulmonary embolism, or PE).  Lung cancer.  Chronic lung disease.  Thyroid problems, especially if the thyroid is overactive (hyperthyroidism).  Caffeine.  Excessive alcohol use or illegal drug use.  Use of some medicines, including certain decongestants and diet pills.  Sometimes, the cause cannot be found. What increases the risk? This condition is more likely to develop in:  People  who are older in age.  People who smoke.  People who have diabetes mellitus.  People who are overweight (obese).  Athletes who exercise vigorously.  What are the signs or symptoms? Symptoms of this condition include:  A feeling that your heart is beating rapidly or irregularly.  A feeling of discomfort or pain in your chest.  Shortness of breath.  Sudden light-headedness or weakness.  Getting tired easily during exercise.  In some cases, there are no symptoms. How is this diagnosed? Your health care provider may be able to detect atrial fibrillation when taking your pulse. If detected, this condition may be diagnosed with:  An electrocardiogram (ECG).  A Holter monitor test that records your heartbeat patterns over a 24-hour period.  Transthoracic echocardiogram (TTE) to evaluate how blood flows through your heart.  Transesophageal echocardiogram (TEE) to view more detailed images of your heart.  A stress test.  Imaging tests, such as a CT scan or chest X-ray.  Blood tests.  How is this treated? The main goals of treatment are to prevent blood clots from forming and to keep your heart beating at a normal rate and rhythm. The type of treatment that you receive depends on many factors, such as your underlying medical conditions and how you feel when you are experiencing atrial fibrillation. This condition may be treated with:  Medicine to slow down the heart rate, bring the heart's rhythm back to normal, or prevent clots from forming.  Electrical cardioversion. This is a procedure that resets your heart's rhythm by delivering a controlled, low-energy shock to the heart through your skin.  Different types of ablation, such as catheter ablation, catheter ablation with pacemaker, or surgical ablation. These procedures destroy the heart tissues  that send abnormal signals. When the pacemaker is used, it is placed under your skin to help your heart beat in a regular  rhythm.  Follow these instructions at home:  Take over-the counter and prescription medicines only as told by your health care provider.  If your health care provider prescribed a blood-thinning medicine (anticoagulant), take it exactly as told. Taking too much blood-thinning medicine can cause bleeding. If you do not take enough blood-thinning medicine, you will not have the protection that you need against stroke and other problems.  Do not use tobacco products, including cigarettes, chewing tobacco, and e-cigarettes. If you need help quitting, ask your health care provider.  If you have obstructive sleep apnea, manage your condition as told by your health care provider.  Do not drink alcohol.  Do not drink beverages that contain caffeine, such as coffee, soda, and tea.  Maintain a healthy weight. Do not use diet pills unless your health care provider approves. Diet pills may make heart problems worse.  Follow diet instructions as told by your health care provider.  Exercise regularly as told by your health care provider.  Keep all follow-up visits as told by your health care provider. This is important. How is this prevented?  Avoid drinking beverages that contain caffeine or alcohol.  Avoid certain medicines, especially medicines that are used for breathing problems.  Avoid certain herbs and herbal medicines, such as those that contain ephedra or ginseng.  Do not use illegal drugs, such as cocaine and amphetamines.  Do not smoke.  Manage your high blood pressure. Contact a health care provider if:  You notice a change in the rate, rhythm, or strength of your heartbeat.  You are taking an anticoagulant and you notice increased bruising.  You tire more easily when you exercise or exert yourself. Get help right away if:  You have chest pain, abdominal pain, sweating, or weakness.  You feel nauseous.  You notice blood in your vomit, bowel movement, or urine.  You  have shortness of breath.  You suddenly have swollen feet and ankles.  You feel dizzy.  You have sudden weakness or numbness of the face, arm, or leg, especially on one side of the body.  You have trouble speaking, trouble understanding, or both (aphasia).  Your face or your eyelid droops on one side. These symptoms may represent a serious problem that is an emergency. Do not wait to see if the symptoms will go away. Get medical help right away. Call your local emergency services (911 in the [place] Do not drive yourself to the hospital. This information is not intended to replace advice given to you by your health care provider. Make sure you discuss any questions you have with your health care provider. Document Released: 09/09/2005 Document Revised: 01/17/2016 Document Reviewed: 01/04/2015 Elsevier Interactive Patient Education  [employer].

## 2017-09-19 DIAGNOSIS — Z79899 Other long term (current) drug therapy: Secondary | ICD-10-CM | POA: Diagnosis not present

## 2017-09-19 DIAGNOSIS — D509 Iron deficiency anemia, unspecified: Secondary | ICD-10-CM | POA: Diagnosis not present

## 2017-09-19 DIAGNOSIS — N2589 Other disorders resulting from impaired renal tubular function: Secondary | ICD-10-CM | POA: Diagnosis not present

## 2017-09-19 DIAGNOSIS — N186 End stage renal disease: Secondary | ICD-10-CM | POA: Diagnosis not present

## 2017-09-19 DIAGNOSIS — D631 Anemia in chronic kidney disease: Secondary | ICD-10-CM | POA: Diagnosis not present

## 2017-09-19 DIAGNOSIS — N2581 Secondary hyperparathyroidism of renal origin: Secondary | ICD-10-CM | POA: Diagnosis not present

## 2017-09-20 DIAGNOSIS — N2589 Other disorders resulting from impaired renal tubular function: Secondary | ICD-10-CM | POA: Diagnosis not present

## 2017-09-20 DIAGNOSIS — Z79899 Other long term (current) drug therapy: Secondary | ICD-10-CM | POA: Diagnosis not present

## 2017-09-20 DIAGNOSIS — D631 Anemia in chronic kidney disease: Secondary | ICD-10-CM | POA: Diagnosis not present

## 2017-09-20 DIAGNOSIS — D509 Iron deficiency anemia, unspecified: Secondary | ICD-10-CM | POA: Diagnosis not present

## 2017-09-20 DIAGNOSIS — N2581 Secondary hyperparathyroidism of renal origin: Secondary | ICD-10-CM | POA: Diagnosis not present

## 2017-09-20 DIAGNOSIS — N186 End stage renal disease: Secondary | ICD-10-CM | POA: Diagnosis not present

## 2017-09-21 DIAGNOSIS — Z79899 Other long term (current) drug therapy: Secondary | ICD-10-CM | POA: Diagnosis not present

## 2017-09-21 DIAGNOSIS — N186 End stage renal disease: Secondary | ICD-10-CM | POA: Diagnosis not present

## 2017-09-21 DIAGNOSIS — N2589 Other disorders resulting from impaired renal tubular function: Secondary | ICD-10-CM | POA: Diagnosis not present

## 2017-09-21 DIAGNOSIS — N2581 Secondary hyperparathyroidism of renal origin: Secondary | ICD-10-CM | POA: Diagnosis not present

## 2017-09-21 DIAGNOSIS — D631 Anemia in chronic kidney disease: Secondary | ICD-10-CM | POA: Diagnosis not present

## 2017-09-21 DIAGNOSIS — D509 Iron deficiency anemia, unspecified: Secondary | ICD-10-CM | POA: Diagnosis not present

## 2017-09-22 DIAGNOSIS — D631 Anemia in chronic kidney disease: Secondary | ICD-10-CM | POA: Diagnosis not present

## 2017-09-22 DIAGNOSIS — N186 End stage renal disease: Secondary | ICD-10-CM | POA: Diagnosis not present

## 2017-09-22 DIAGNOSIS — T8612 Kidney transplant failure: Secondary | ICD-10-CM | POA: Diagnosis not present

## 2017-09-22 DIAGNOSIS — N2581 Secondary hyperparathyroidism of renal origin: Secondary | ICD-10-CM | POA: Diagnosis not present

## 2017-09-22 DIAGNOSIS — Z992 Dependence on renal dialysis: Secondary | ICD-10-CM | POA: Diagnosis not present

## 2017-09-22 DIAGNOSIS — Z79899 Other long term (current) drug therapy: Secondary | ICD-10-CM | POA: Diagnosis not present

## 2017-09-22 DIAGNOSIS — D509 Iron deficiency anemia, unspecified: Secondary | ICD-10-CM | POA: Diagnosis not present

## 2017-09-22 DIAGNOSIS — N2589 Other disorders resulting from impaired renal tubular function: Secondary | ICD-10-CM | POA: Diagnosis not present

## 2017-09-23 DIAGNOSIS — Z4931 Encounter for adequacy testing for hemodialysis: Secondary | ICD-10-CM | POA: Diagnosis not present

## 2017-09-23 DIAGNOSIS — N186 End stage renal disease: Secondary | ICD-10-CM | POA: Diagnosis not present

## 2017-09-23 DIAGNOSIS — N2581 Secondary hyperparathyroidism of renal origin: Secondary | ICD-10-CM | POA: Diagnosis not present

## 2017-09-23 DIAGNOSIS — N2589 Other disorders resulting from impaired renal tubular function: Secondary | ICD-10-CM | POA: Diagnosis not present

## 2017-09-23 DIAGNOSIS — K769 Liver disease, unspecified: Secondary | ICD-10-CM | POA: Diagnosis not present

## 2017-09-23 DIAGNOSIS — D509 Iron deficiency anemia, unspecified: Secondary | ICD-10-CM | POA: Diagnosis not present

## 2017-09-23 DIAGNOSIS — D631 Anemia in chronic kidney disease: Secondary | ICD-10-CM | POA: Diagnosis not present

## 2017-09-24 DIAGNOSIS — Z4931 Encounter for adequacy testing for hemodialysis: Secondary | ICD-10-CM | POA: Diagnosis not present

## 2017-09-24 DIAGNOSIS — D631 Anemia in chronic kidney disease: Secondary | ICD-10-CM | POA: Diagnosis not present

## 2017-09-24 DIAGNOSIS — D509 Iron deficiency anemia, unspecified: Secondary | ICD-10-CM | POA: Diagnosis not present

## 2017-09-24 DIAGNOSIS — N2581 Secondary hyperparathyroidism of renal origin: Secondary | ICD-10-CM | POA: Diagnosis not present

## 2017-09-24 DIAGNOSIS — N186 End stage renal disease: Secondary | ICD-10-CM | POA: Diagnosis not present

## 2017-09-24 DIAGNOSIS — N2589 Other disorders resulting from impaired renal tubular function: Secondary | ICD-10-CM | POA: Diagnosis not present

## 2017-09-25 ENCOUNTER — Ambulatory Visit: Payer: Medicare Other | Admitting: Neurology

## 2017-09-25 DIAGNOSIS — N2589 Other disorders resulting from impaired renal tubular function: Secondary | ICD-10-CM | POA: Diagnosis not present

## 2017-09-25 DIAGNOSIS — E119 Type 2 diabetes mellitus without complications: Secondary | ICD-10-CM | POA: Diagnosis not present

## 2017-09-25 DIAGNOSIS — R82998 Other abnormal findings in urine: Secondary | ICD-10-CM | POA: Diagnosis not present

## 2017-09-25 DIAGNOSIS — Z4931 Encounter for adequacy testing for hemodialysis: Secondary | ICD-10-CM | POA: Diagnosis not present

## 2017-09-25 DIAGNOSIS — E785 Hyperlipidemia, unspecified: Secondary | ICD-10-CM | POA: Diagnosis not present

## 2017-09-25 DIAGNOSIS — N2581 Secondary hyperparathyroidism of renal origin: Secondary | ICD-10-CM | POA: Diagnosis not present

## 2017-09-25 DIAGNOSIS — E7849 Other hyperlipidemia: Secondary | ICD-10-CM | POA: Diagnosis not present

## 2017-09-25 DIAGNOSIS — N186 End stage renal disease: Secondary | ICD-10-CM | POA: Diagnosis not present

## 2017-09-25 DIAGNOSIS — D509 Iron deficiency anemia, unspecified: Secondary | ICD-10-CM | POA: Diagnosis not present

## 2017-09-25 DIAGNOSIS — D631 Anemia in chronic kidney disease: Secondary | ICD-10-CM | POA: Diagnosis not present

## 2017-09-26 DIAGNOSIS — Z4931 Encounter for adequacy testing for hemodialysis: Secondary | ICD-10-CM | POA: Diagnosis not present

## 2017-09-26 DIAGNOSIS — N186 End stage renal disease: Secondary | ICD-10-CM | POA: Diagnosis not present

## 2017-09-26 DIAGNOSIS — N2589 Other disorders resulting from impaired renal tubular function: Secondary | ICD-10-CM | POA: Diagnosis not present

## 2017-09-26 DIAGNOSIS — D509 Iron deficiency anemia, unspecified: Secondary | ICD-10-CM | POA: Diagnosis not present

## 2017-09-26 DIAGNOSIS — N2581 Secondary hyperparathyroidism of renal origin: Secondary | ICD-10-CM | POA: Diagnosis not present

## 2017-09-26 DIAGNOSIS — D631 Anemia in chronic kidney disease: Secondary | ICD-10-CM | POA: Diagnosis not present

## 2017-09-27 DIAGNOSIS — N2581 Secondary hyperparathyroidism of renal origin: Secondary | ICD-10-CM | POA: Diagnosis not present

## 2017-09-27 DIAGNOSIS — Z4931 Encounter for adequacy testing for hemodialysis: Secondary | ICD-10-CM | POA: Diagnosis not present

## 2017-09-27 DIAGNOSIS — N186 End stage renal disease: Secondary | ICD-10-CM | POA: Diagnosis not present

## 2017-09-27 DIAGNOSIS — D509 Iron deficiency anemia, unspecified: Secondary | ICD-10-CM | POA: Diagnosis not present

## 2017-09-27 DIAGNOSIS — N2589 Other disorders resulting from impaired renal tubular function: Secondary | ICD-10-CM | POA: Diagnosis not present

## 2017-09-27 DIAGNOSIS — D631 Anemia in chronic kidney disease: Secondary | ICD-10-CM | POA: Diagnosis not present

## 2017-09-28 DIAGNOSIS — N2589 Other disorders resulting from impaired renal tubular function: Secondary | ICD-10-CM | POA: Diagnosis not present

## 2017-09-28 DIAGNOSIS — N2581 Secondary hyperparathyroidism of renal origin: Secondary | ICD-10-CM | POA: Diagnosis not present

## 2017-09-28 DIAGNOSIS — N186 End stage renal disease: Secondary | ICD-10-CM | POA: Diagnosis not present

## 2017-09-28 DIAGNOSIS — Z4931 Encounter for adequacy testing for hemodialysis: Secondary | ICD-10-CM | POA: Diagnosis not present

## 2017-09-28 DIAGNOSIS — D509 Iron deficiency anemia, unspecified: Secondary | ICD-10-CM | POA: Diagnosis not present

## 2017-09-28 DIAGNOSIS — D631 Anemia in chronic kidney disease: Secondary | ICD-10-CM | POA: Diagnosis not present

## 2017-09-29 DIAGNOSIS — D509 Iron deficiency anemia, unspecified: Secondary | ICD-10-CM | POA: Diagnosis not present

## 2017-09-29 DIAGNOSIS — N186 End stage renal disease: Secondary | ICD-10-CM | POA: Diagnosis not present

## 2017-09-29 DIAGNOSIS — D631 Anemia in chronic kidney disease: Secondary | ICD-10-CM | POA: Diagnosis not present

## 2017-09-29 DIAGNOSIS — Z4931 Encounter for adequacy testing for hemodialysis: Secondary | ICD-10-CM | POA: Diagnosis not present

## 2017-09-29 DIAGNOSIS — N2581 Secondary hyperparathyroidism of renal origin: Secondary | ICD-10-CM | POA: Diagnosis not present

## 2017-09-29 DIAGNOSIS — N2589 Other disorders resulting from impaired renal tubular function: Secondary | ICD-10-CM | POA: Diagnosis not present

## 2017-09-30 ENCOUNTER — Institutional Professional Consult (permissible substitution): Payer: Medicare Other | Admitting: Internal Medicine

## 2017-09-30 DIAGNOSIS — Z4931 Encounter for adequacy testing for hemodialysis: Secondary | ICD-10-CM | POA: Diagnosis not present

## 2017-09-30 DIAGNOSIS — D631 Anemia in chronic kidney disease: Secondary | ICD-10-CM | POA: Diagnosis not present

## 2017-09-30 DIAGNOSIS — N186 End stage renal disease: Secondary | ICD-10-CM | POA: Diagnosis not present

## 2017-09-30 DIAGNOSIS — D509 Iron deficiency anemia, unspecified: Secondary | ICD-10-CM | POA: Diagnosis not present

## 2017-09-30 DIAGNOSIS — N2589 Other disorders resulting from impaired renal tubular function: Secondary | ICD-10-CM | POA: Diagnosis not present

## 2017-09-30 DIAGNOSIS — N2581 Secondary hyperparathyroidism of renal origin: Secondary | ICD-10-CM | POA: Diagnosis not present

## 2017-10-01 DIAGNOSIS — N2589 Other disorders resulting from impaired renal tubular function: Secondary | ICD-10-CM | POA: Diagnosis not present

## 2017-10-01 DIAGNOSIS — Z4931 Encounter for adequacy testing for hemodialysis: Secondary | ICD-10-CM | POA: Diagnosis not present

## 2017-10-01 DIAGNOSIS — N186 End stage renal disease: Secondary | ICD-10-CM | POA: Diagnosis not present

## 2017-10-01 DIAGNOSIS — D631 Anemia in chronic kidney disease: Secondary | ICD-10-CM | POA: Diagnosis not present

## 2017-10-01 DIAGNOSIS — D509 Iron deficiency anemia, unspecified: Secondary | ICD-10-CM | POA: Diagnosis not present

## 2017-10-01 DIAGNOSIS — N2581 Secondary hyperparathyroidism of renal origin: Secondary | ICD-10-CM | POA: Diagnosis not present

## 2017-10-02 DIAGNOSIS — N2589 Other disorders resulting from impaired renal tubular function: Secondary | ICD-10-CM | POA: Diagnosis not present

## 2017-10-02 DIAGNOSIS — D509 Iron deficiency anemia, unspecified: Secondary | ICD-10-CM | POA: Diagnosis not present

## 2017-10-02 DIAGNOSIS — N2581 Secondary hyperparathyroidism of renal origin: Secondary | ICD-10-CM | POA: Diagnosis not present

## 2017-10-02 DIAGNOSIS — Z4931 Encounter for adequacy testing for hemodialysis: Secondary | ICD-10-CM | POA: Diagnosis not present

## 2017-10-02 DIAGNOSIS — N186 End stage renal disease: Secondary | ICD-10-CM | POA: Diagnosis not present

## 2017-10-02 DIAGNOSIS — D631 Anemia in chronic kidney disease: Secondary | ICD-10-CM | POA: Diagnosis not present

## 2017-10-03 ENCOUNTER — Ambulatory Visit
Admission: RE | Admit: 2017-10-03 | Discharge: 2017-10-03 | Disposition: A | Discharge status: Home / Self Care | Payer: BLUE CROSS/BLUE SHIELD | Source: Ambulatory Visit | Attending: Neurology | Admitting: Neurology

## 2017-10-03 DIAGNOSIS — N186 End stage renal disease: Secondary | ICD-10-CM | POA: Diagnosis not present

## 2017-10-03 DIAGNOSIS — D509 Iron deficiency anemia, unspecified: Secondary | ICD-10-CM | POA: Diagnosis not present

## 2017-10-03 DIAGNOSIS — I671 Cerebral aneurysm, nonruptured: Secondary | ICD-10-CM | POA: Diagnosis not present

## 2017-10-03 DIAGNOSIS — Z4931 Encounter for adequacy testing for hemodialysis: Secondary | ICD-10-CM | POA: Diagnosis not present

## 2017-10-03 DIAGNOSIS — N2589 Other disorders resulting from impaired renal tubular function: Secondary | ICD-10-CM | POA: Diagnosis not present

## 2017-10-03 DIAGNOSIS — D631 Anemia in chronic kidney disease: Secondary | ICD-10-CM | POA: Diagnosis not present

## 2017-10-03 DIAGNOSIS — N2581 Secondary hyperparathyroidism of renal origin: Secondary | ICD-10-CM | POA: Diagnosis not present

## 2017-10-03 DIAGNOSIS — I639 Cerebral infarction, unspecified: Secondary | ICD-10-CM

## 2017-10-04 DIAGNOSIS — Z4931 Encounter for adequacy testing for hemodialysis: Secondary | ICD-10-CM | POA: Diagnosis not present

## 2017-10-04 DIAGNOSIS — D631 Anemia in chronic kidney disease: Secondary | ICD-10-CM | POA: Diagnosis not present

## 2017-10-04 DIAGNOSIS — N2581 Secondary hyperparathyroidism of renal origin: Secondary | ICD-10-CM | POA: Diagnosis not present

## 2017-10-04 DIAGNOSIS — D509 Iron deficiency anemia, unspecified: Secondary | ICD-10-CM | POA: Diagnosis not present

## 2017-10-04 DIAGNOSIS — N2589 Other disorders resulting from impaired renal tubular function: Secondary | ICD-10-CM | POA: Diagnosis not present

## 2017-10-04 DIAGNOSIS — N186 End stage renal disease: Secondary | ICD-10-CM | POA: Diagnosis not present

## 2017-10-05 DIAGNOSIS — D509 Iron deficiency anemia, unspecified: Secondary | ICD-10-CM | POA: Diagnosis not present

## 2017-10-05 DIAGNOSIS — N2581 Secondary hyperparathyroidism of renal origin: Secondary | ICD-10-CM | POA: Diagnosis not present

## 2017-10-05 DIAGNOSIS — Z4931 Encounter for adequacy testing for hemodialysis: Secondary | ICD-10-CM | POA: Diagnosis not present

## 2017-10-05 DIAGNOSIS — N2589 Other disorders resulting from impaired renal tubular function: Secondary | ICD-10-CM | POA: Diagnosis not present

## 2017-10-05 DIAGNOSIS — N186 End stage renal disease: Secondary | ICD-10-CM | POA: Diagnosis not present

## 2017-10-05 DIAGNOSIS — D631 Anemia in chronic kidney disease: Secondary | ICD-10-CM | POA: Diagnosis not present

## 2017-10-06 DIAGNOSIS — N186 End stage renal disease: Secondary | ICD-10-CM | POA: Diagnosis not present

## 2017-10-06 DIAGNOSIS — D509 Iron deficiency anemia, unspecified: Secondary | ICD-10-CM | POA: Diagnosis not present

## 2017-10-06 DIAGNOSIS — Z4931 Encounter for adequacy testing for hemodialysis: Secondary | ICD-10-CM | POA: Diagnosis not present

## 2017-10-06 DIAGNOSIS — N2581 Secondary hyperparathyroidism of renal origin: Secondary | ICD-10-CM | POA: Diagnosis not present

## 2017-10-06 DIAGNOSIS — N2589 Other disorders resulting from impaired renal tubular function: Secondary | ICD-10-CM | POA: Diagnosis not present

## 2017-10-06 DIAGNOSIS — D631 Anemia in chronic kidney disease: Secondary | ICD-10-CM | POA: Diagnosis not present

## 2017-10-07 ENCOUNTER — Encounter: Payer: Self-pay | Admitting: Internal Medicine

## 2017-10-07 DIAGNOSIS — N2589 Other disorders resulting from impaired renal tubular function: Secondary | ICD-10-CM | POA: Diagnosis not present

## 2017-10-07 DIAGNOSIS — N186 End stage renal disease: Secondary | ICD-10-CM | POA: Diagnosis not present

## 2017-10-07 DIAGNOSIS — Z4931 Encounter for adequacy testing for hemodialysis: Secondary | ICD-10-CM | POA: Diagnosis not present

## 2017-10-07 DIAGNOSIS — D509 Iron deficiency anemia, unspecified: Secondary | ICD-10-CM | POA: Diagnosis not present

## 2017-10-07 DIAGNOSIS — D631 Anemia in chronic kidney disease: Secondary | ICD-10-CM | POA: Diagnosis not present

## 2017-10-07 DIAGNOSIS — N2581 Secondary hyperparathyroidism of renal origin: Secondary | ICD-10-CM | POA: Diagnosis not present

## 2017-10-08 ENCOUNTER — Encounter: Payer: Self-pay | Admitting: Internal Medicine

## 2017-10-08 ENCOUNTER — Telehealth: Payer: Self-pay | Admitting: *Deleted

## 2017-10-08 ENCOUNTER — Ambulatory Visit (INDEPENDENT_AMBULATORY_CARE_PROVIDER_SITE_OTHER): Payer: Medicare Other | Admitting: Internal Medicine

## 2017-10-08 VITALS — BP 112/62 | HR 65 | Ht 65.0 in | Wt 146.0 lb

## 2017-10-08 DIAGNOSIS — N2581 Secondary hyperparathyroidism of renal origin: Secondary | ICD-10-CM | POA: Diagnosis not present

## 2017-10-08 DIAGNOSIS — D509 Iron deficiency anemia, unspecified: Secondary | ICD-10-CM | POA: Diagnosis not present

## 2017-10-08 DIAGNOSIS — N2589 Other disorders resulting from impaired renal tubular function: Secondary | ICD-10-CM | POA: Diagnosis not present

## 2017-10-08 DIAGNOSIS — Z4931 Encounter for adequacy testing for hemodialysis: Secondary | ICD-10-CM | POA: Diagnosis not present

## 2017-10-08 DIAGNOSIS — I481 Persistent atrial fibrillation: Secondary | ICD-10-CM | POA: Diagnosis not present

## 2017-10-08 DIAGNOSIS — D631 Anemia in chronic kidney disease: Secondary | ICD-10-CM | POA: Diagnosis not present

## 2017-10-08 DIAGNOSIS — I63411 Cerebral infarction due to embolism of right middle cerebral artery: Secondary | ICD-10-CM

## 2017-10-08 DIAGNOSIS — N186 End stage renal disease: Secondary | ICD-10-CM | POA: Diagnosis not present

## 2017-10-08 DIAGNOSIS — I4819 Other persistent atrial fibrillation: Secondary | ICD-10-CM

## 2017-10-08 NOTE — Telephone Encounter (Signed)
Patient returned call. Discussed her MRA results, MRA shows a 66mm stable aneurysm. Dr. Jaynee Eagles recommends continued yearly surveillance. Alternatively, patient could be referred to Dr. Estanislado Pandy for patient to learn more about how aneurysms are treated. She is aware that this would involve a cerebral angiogram. She states she would like to just monitor it for now.

## 2017-10-08 NOTE — Patient Instructions (Signed)
Medication Instructions:  Your physician recommends that you continue on your current medications as directed. Please refer to the Current Medication list given to you today.   Labwork: None ordered   Testing/Procedures: Your physician has requested that you have a TEE. During a TEE, sound waves are used to create images of your heart. It provides your doctor with information about the size and shape of your heart and how well your heart's chambers and valves are working. In this test, a transducer is attached to the end of a flexible tube that's guided down your throat and into your esophagus (the tube leading from you mouth to your stomach) to get a more detailed image of your heart. You are not awake for the procedure. Please see the instruction sheet given to you today. For further information please visit [employer].   LINQ implant with 30 day monitoring  After calling your insurance company please call if you decide to proceed  Follow-Up: Your physician recommends that you schedule a follow-up appointment as needed.  Call if you decide to proceed with above   Any Other Special Instructions Will Be Listed Below (If Applicable).     If you need a refill on your cardiac medications before your next appointment, please call your pharmacy.

## 2017-10-08 NOTE — Telephone Encounter (Signed)
-----   Message from Melvenia Beam, MD sent at 10/06/2017 12:43 PM EST ----- MRA shows stable 74mm aneurysm. Otherwise normal. Recommend continued surveillance yearly of the aneurysm. Alternatively could send her to Dr. Patrecia Pour if she wants to learn more about how aneurysms are treated and see if she would be interested in that, it involves a cerebral angiogram. thanks

## 2017-10-08 NOTE — Telephone Encounter (Signed)
Called patient and LVM asking for call back.

## 2017-10-08 NOTE — Progress Notes (Signed)
Electrophysiology Office Note   Date:  10/08/2017   ID:  Kimberly [name], DOB 13-Jan-1956, MRN [id number]  PCP:  [name], MD    Primary Electrophysiologist: Thompson Grayer, MD    Chief Complaint  Patient presents with  . Appointment    stroke     History of Present Illness: Kimberly [name] is a 62 y.o. female who presents today for electrophysiology evaluation.   She is referred by Dr Jaynee Eagles for EP consultation regarding stroke.  She has has two prior strokes.  These appear to be embolic but currently of an unknown source.  The patient was observed to have short episodes of Afib (< 1 minutes) in 2016 in the setting of pericarditis.  She is unaware of arrhythmias since that time.  She has recently been evaluated by Dr Jaynee Eagles for her strokes.  She is referred for consideration of TEE and ILR.   Today, she denies symptoms of palpitations, chest pain, shortness of breath, orthopnea, PND, lower extremity edema, claudication, dizziness, presyncope, syncope, bleeding, or neurologic sequela. The patient is tolerating medications without difficulties and is otherwise without complaint today.    Past Medical History:  Diagnosis Date  . Anemia   . Asthma   . Diabetes mellitus    medication induced with  steroids  . Diverticulosis   . First degree AV block   . GERD (gastroesophageal reflux disease)   . Heart murmur    child  . History of chronic cough   . History of hiatal hernia   . History of hoarseness   . Hypertension    history; resolved on dialysis  . Hypothyroidism   . IgA nephropathy    on peritoneal dialysis  . Immunosuppression ([place])   . Kidney transplanted 01/2010   [facility], has had some rejection  . Patient on peritoneal dialysis ([place])   . Premature atrial beat   . Premature ventricular beat   . Renal transplant failure and rejection 2012  . Shingles 03/2011  . Stroke [facility]) October 2010, March 2013   cerebral aneurysm   Past Surgical History:  Procedure  Laterality Date  . CAPD INSERTION  08/25/2008   open, Dr [name]  . CAPD INSERTION N/A 12/14/2013   Procedure: LAPAROSCOPIC INSERTION CONTINUOUS AMBULATORY PERITONEAL DIALYSIS  (CAPD) CATHETER;  Surgeon: [name], MD;  Location: [place];  Service: General;  Laterality: N/A;  . CAPD REMOVAL  03/22/2010   Dr [name]  . EYE SURGERY Bilateral    radial keratotomy  . INSERTION OF MESH N/A 12/14/2013   Procedure: INSERTION OF MESH;  Surgeon: [name], MD;  Location: [place];  Service: General;  Laterality: N/A;  . KIDNEY TRANSPLANT Right 01/2010   [place]  . LAPAROSCOPIC LYSIS OF ADHESIONS N/A 12/14/2013   Procedure: LAPAROSCOPIC LYSIS OF ADHESIONS;  Surgeon: [name], MD;  Location: [place];  Service: General;  Laterality: N/A;  . UMBILICAL HERNIA REPAIR  10/25/2009   open with mesh,  Dr [name]  . URETER REVISION  04/2011   [facility]  . VENTRAL HERNIA REPAIR N/A 12/14/2013   Procedure: LAPAROSCOPIC VENTRAL HERNIA;  Surgeon: [name], MD;  Location: [place];  Service: General;  Laterality: N/A;  . VIDEO ASSISTED THORACOSCOPY (VATS)/WEDGE RESECTION Left 09/01/2014   Procedure: VIDEO ASSISTED THORACOSCOPY (VATS)/WEDGE RESECTION;  Surgeon: [name], MD;  Location: [place];  Service: Thoracic;  Laterality: Left;  Marland Kitchen VIDEO BRONCHOSCOPY Bilateral 07/05/2014   Procedure: VIDEO BRONCHOSCOPY WITH FLUORO;  Surgeon:  [name], MD;  Location: [facility];  Service: Cardiopulmonary;  Laterality: Bilateral;     Current Outpatient Medications  Medication Sig Dispense Refill  . aspirin EC 325 MG tablet Take 1 tablet (325 mg total) by mouth daily. 30 tablet 0  . BREO ELLIPTA 200-25 MCG/INH AEPB Inhale 1 puff into the lungs every evening.    . calcitRIOL (ROCALTROL) 0.5 MCG capsule Take 0.5 mcg by mouth 3 (three) times a week.     . chlorpheniramine (CHLOR-TRIMETON) 4 MG tablet Take 4 mg by mouth every 4 (four) hours as needed for allergies.    . Cholecalciferol (VITAMIN D3) 3000  UNITS TABS Take 3,000 Units by mouth daily.    . cinacalcet (SENSIPAR) 30 MG tablet Take 30 mg by mouth daily with breakfast.     . Digestive Enzymes (DIGESTIVE ENZYME PO) Take 1 capsule by mouth daily.    Marland Kitchen epoetin alfa (EPOGEN,PROCRIT) 95621 UNIT/ML injection Inject 10,000 Units into the skin every 30 (thirty) days.     . ferrous fumarate (HEMOCYTE - 106 MG FE) 325 (106 FE) MG TABS tablet Take 1 tablet by mouth daily.     Marland Kitchen gentamicin cream (GARAMYCIN) 0.1 % Apply 1 application topically daily.     Marland Kitchen lanthanum (FOSRENOL) 1000 MG chewable tablet Chew 1,000 mg by mouth daily.     Marland Kitchen levothyroxine (SYNTHROID, LEVOTHROID) 50 MCG tablet Take 50 mcg by mouth daily before breakfast. 59mcg on Sun, Mon, Wed, Fri and Sat. 10mcg on Tues and Thurs.    . metoCLOPramide (REGLAN) 5 MG tablet Take 5 mg by mouth daily as needed for nausea or vomiting.     . potassium chloride SA (K-DUR,KLOR-CON) 20 MEQ tablet Take 20 mEq by mouth daily.    Marland Kitchen SPIRIVA RESPIMAT 1.25 MCG/ACT AERS Inhale 2 puffs into the lungs daily.     No current facility-administered medications for this visit.     Allergies:   Other; Dapsone; Pregabalin; Sulfonamide derivatives; and Tramadol   Social History:  The patient  reports that  has never smoked. she has never used smokeless tobacco. She reports that she drinks alcohol. She reports that she does not use drugs.   Family History:  The patient's  family history includes Coronary artery disease in her mother; Diabetes in her maternal uncle; Emphysema in her mother; Heart disease in her mother; Kidney disease in her maternal uncle; Liver disease in her father; Ovarian cancer in her mother; Stroke in her maternal uncle.    ROS:  Please see the history of present illness.   All other systems are personally reviewed and negative.    PHYSICAL EXAM: VS:  Ht 5\' 5"  (1.651 m)   BMI 23.46 kg/m  , BMI Body mass index is 23.46 kg/m. GEN: Well nourished, well developed, in no acute distress    HEENT: normal  Neck: no JVD, carotid bruits, or masses Cardiac: RRR; no murmurs, rubs, or gallops,no edema  Respiratory:  clear to auscultation bilaterally, normal work of breathing GI: soft, nontender, nondistended, + BS MS: no deformity or atrophy  Skin: warm and dry  Neuro:  Strength and sensation are intact Psych: euthymic mood, full affect  EKG:  EKG is ordered today. The ekg ordered today is personally reviewed and shows sinus rhythm with nonspecific St/T changes   Recent Labs: 08/22/2017: ALT 13; BUN 30; Creatinine, Ser 8.70; Hemoglobin 10.2; Platelets 203; Potassium 3.2; Sodium 134  personally reviewed   Lipid Panel     Component Value Date/Time  CHOL 148 11/30/2011 0016   TRIG 89 11/30/2011 0016   HDL 54 11/30/2011 0016   CHOLHDL 2.7 11/30/2011 0016   VLDL 18 11/30/2011 0016   LDLCALC 76 11/30/2011 0016   personally reviewed   Wt Readings from Last 3 Encounters:  09/18/17 141 lb (64 kg)  07/21/17 146 lb 8 oz (66.5 kg)  05/07/16 143 lb 6 oz (65 kg)      Other studies personally reviewed: Additional studies/ records that were reviewed today include: my note from 2016, prior echo, Dr Cathren Laine notes  Review of the above records today demonstrates: as above   ASSESSMENT AND PLAN:  1.  Stroke The patient has had recurrent embolic stroke of unknown cause.  I have discussed by phone with Dr Jaynee Eagles.  We both agree that given prior afib in 2016 that anticoagulation would be a very appropriate next step.  Unfortunately, given her ESRD on peritoneal dialysis, our best option would be coumadin.  The patient is clear that she would like to avoid coumadin.  She is not certain that she still has afib (as it occurred previously in the setting of pericarditis).  I have offered TEE/ implantable loop recorder to further evaluate the cause of her stroke given her reluctance to start coumadin based on her short AF in the setting of pericarditis.  She would like to look into the costs  of TEE and ILR, as well as costs of remote monitoring long term.  If she feels that costs are acceptable then she may be willing to proceed.  She will contact our office if she decides to proceed.  Otherwise, I will see as needed going forward.   Current medicines are reviewed at length with the patient today.   The patient does not have concerns regarding her medicines.  The following changes were made today:  none    Signed, Thompson Grayer, MD  10/08/2017 11:55 AM     [facility] [address] [place] [address] [phone] (office) [phone] (fax)

## 2017-10-09 DIAGNOSIS — D631 Anemia in chronic kidney disease: Secondary | ICD-10-CM | POA: Diagnosis not present

## 2017-10-09 DIAGNOSIS — N2589 Other disorders resulting from impaired renal tubular function: Secondary | ICD-10-CM | POA: Diagnosis not present

## 2017-10-09 DIAGNOSIS — Z4931 Encounter for adequacy testing for hemodialysis: Secondary | ICD-10-CM | POA: Diagnosis not present

## 2017-10-09 DIAGNOSIS — N2581 Secondary hyperparathyroidism of renal origin: Secondary | ICD-10-CM | POA: Diagnosis not present

## 2017-10-09 DIAGNOSIS — N186 End stage renal disease: Secondary | ICD-10-CM | POA: Diagnosis not present

## 2017-10-09 DIAGNOSIS — D509 Iron deficiency anemia, unspecified: Secondary | ICD-10-CM | POA: Diagnosis not present

## 2017-10-10 DIAGNOSIS — N2589 Other disorders resulting from impaired renal tubular function: Secondary | ICD-10-CM | POA: Diagnosis not present

## 2017-10-10 DIAGNOSIS — N2581 Secondary hyperparathyroidism of renal origin: Secondary | ICD-10-CM | POA: Diagnosis not present

## 2017-10-10 DIAGNOSIS — Z4931 Encounter for adequacy testing for hemodialysis: Secondary | ICD-10-CM | POA: Diagnosis not present

## 2017-10-10 DIAGNOSIS — D509 Iron deficiency anemia, unspecified: Secondary | ICD-10-CM | POA: Diagnosis not present

## 2017-10-10 DIAGNOSIS — D631 Anemia in chronic kidney disease: Secondary | ICD-10-CM | POA: Diagnosis not present

## 2017-10-10 DIAGNOSIS — N186 End stage renal disease: Secondary | ICD-10-CM | POA: Diagnosis not present

## 2017-10-11 DIAGNOSIS — N2581 Secondary hyperparathyroidism of renal origin: Secondary | ICD-10-CM | POA: Diagnosis not present

## 2017-10-11 DIAGNOSIS — N2589 Other disorders resulting from impaired renal tubular function: Secondary | ICD-10-CM | POA: Diagnosis not present

## 2017-10-11 DIAGNOSIS — N186 End stage renal disease: Secondary | ICD-10-CM | POA: Diagnosis not present

## 2017-10-11 DIAGNOSIS — D509 Iron deficiency anemia, unspecified: Secondary | ICD-10-CM | POA: Diagnosis not present

## 2017-10-11 DIAGNOSIS — D631 Anemia in chronic kidney disease: Secondary | ICD-10-CM | POA: Diagnosis not present

## 2017-10-11 DIAGNOSIS — Z4931 Encounter for adequacy testing for hemodialysis: Secondary | ICD-10-CM | POA: Diagnosis not present

## 2017-10-12 DIAGNOSIS — N2581 Secondary hyperparathyroidism of renal origin: Secondary | ICD-10-CM | POA: Diagnosis not present

## 2017-10-12 DIAGNOSIS — D509 Iron deficiency anemia, unspecified: Secondary | ICD-10-CM | POA: Diagnosis not present

## 2017-10-12 DIAGNOSIS — Z4931 Encounter for adequacy testing for hemodialysis: Secondary | ICD-10-CM | POA: Diagnosis not present

## 2017-10-12 DIAGNOSIS — N186 End stage renal disease: Secondary | ICD-10-CM | POA: Diagnosis not present

## 2017-10-12 DIAGNOSIS — N2589 Other disorders resulting from impaired renal tubular function: Secondary | ICD-10-CM | POA: Diagnosis not present

## 2017-10-12 DIAGNOSIS — D631 Anemia in chronic kidney disease: Secondary | ICD-10-CM | POA: Diagnosis not present

## 2017-10-13 DIAGNOSIS — Z4931 Encounter for adequacy testing for hemodialysis: Secondary | ICD-10-CM | POA: Diagnosis not present

## 2017-10-13 DIAGNOSIS — D509 Iron deficiency anemia, unspecified: Secondary | ICD-10-CM | POA: Diagnosis not present

## 2017-10-13 DIAGNOSIS — N2581 Secondary hyperparathyroidism of renal origin: Secondary | ICD-10-CM | POA: Diagnosis not present

## 2017-10-13 DIAGNOSIS — N2589 Other disorders resulting from impaired renal tubular function: Secondary | ICD-10-CM | POA: Diagnosis not present

## 2017-10-13 DIAGNOSIS — D631 Anemia in chronic kidney disease: Secondary | ICD-10-CM | POA: Diagnosis not present

## 2017-10-13 DIAGNOSIS — N186 End stage renal disease: Secondary | ICD-10-CM | POA: Diagnosis not present

## 2017-10-14 DIAGNOSIS — N186 End stage renal disease: Secondary | ICD-10-CM | POA: Diagnosis not present

## 2017-10-14 DIAGNOSIS — D631 Anemia in chronic kidney disease: Secondary | ICD-10-CM | POA: Diagnosis not present

## 2017-10-14 DIAGNOSIS — N2589 Other disorders resulting from impaired renal tubular function: Secondary | ICD-10-CM | POA: Diagnosis not present

## 2017-10-14 DIAGNOSIS — N2581 Secondary hyperparathyroidism of renal origin: Secondary | ICD-10-CM | POA: Diagnosis not present

## 2017-10-14 DIAGNOSIS — Z4931 Encounter for adequacy testing for hemodialysis: Secondary | ICD-10-CM | POA: Diagnosis not present

## 2017-10-14 DIAGNOSIS — D509 Iron deficiency anemia, unspecified: Secondary | ICD-10-CM | POA: Diagnosis not present

## 2017-10-15 DIAGNOSIS — D509 Iron deficiency anemia, unspecified: Secondary | ICD-10-CM | POA: Diagnosis not present

## 2017-10-15 DIAGNOSIS — N2581 Secondary hyperparathyroidism of renal origin: Secondary | ICD-10-CM | POA: Diagnosis not present

## 2017-10-15 DIAGNOSIS — N2589 Other disorders resulting from impaired renal tubular function: Secondary | ICD-10-CM | POA: Diagnosis not present

## 2017-10-15 DIAGNOSIS — D631 Anemia in chronic kidney disease: Secondary | ICD-10-CM | POA: Diagnosis not present

## 2017-10-15 DIAGNOSIS — N186 End stage renal disease: Secondary | ICD-10-CM | POA: Diagnosis not present

## 2017-10-15 DIAGNOSIS — Z4931 Encounter for adequacy testing for hemodialysis: Secondary | ICD-10-CM | POA: Diagnosis not present

## 2017-10-16 DIAGNOSIS — N186 End stage renal disease: Secondary | ICD-10-CM | POA: Diagnosis not present

## 2017-10-16 DIAGNOSIS — Z4931 Encounter for adequacy testing for hemodialysis: Secondary | ICD-10-CM | POA: Diagnosis not present

## 2017-10-16 DIAGNOSIS — D631 Anemia in chronic kidney disease: Secondary | ICD-10-CM | POA: Diagnosis not present

## 2017-10-16 DIAGNOSIS — D509 Iron deficiency anemia, unspecified: Secondary | ICD-10-CM | POA: Diagnosis not present

## 2017-10-16 DIAGNOSIS — N2581 Secondary hyperparathyroidism of renal origin: Secondary | ICD-10-CM | POA: Diagnosis not present

## 2017-10-16 DIAGNOSIS — N2589 Other disorders resulting from impaired renal tubular function: Secondary | ICD-10-CM | POA: Diagnosis not present

## 2017-10-17 DIAGNOSIS — J4550 Severe persistent asthma, uncomplicated: Secondary | ICD-10-CM | POA: Diagnosis not present

## 2017-10-17 DIAGNOSIS — Z4931 Encounter for adequacy testing for hemodialysis: Secondary | ICD-10-CM | POA: Diagnosis not present

## 2017-10-17 DIAGNOSIS — R918 Other nonspecific abnormal finding of lung field: Secondary | ICD-10-CM | POA: Diagnosis not present

## 2017-10-17 DIAGNOSIS — D509 Iron deficiency anemia, unspecified: Secondary | ICD-10-CM | POA: Diagnosis not present

## 2017-10-17 DIAGNOSIS — D631 Anemia in chronic kidney disease: Secondary | ICD-10-CM | POA: Diagnosis not present

## 2017-10-17 DIAGNOSIS — N186 End stage renal disease: Secondary | ICD-10-CM | POA: Diagnosis not present

## 2017-10-17 DIAGNOSIS — N2581 Secondary hyperparathyroidism of renal origin: Secondary | ICD-10-CM | POA: Diagnosis not present

## 2017-10-17 DIAGNOSIS — N2589 Other disorders resulting from impaired renal tubular function: Secondary | ICD-10-CM | POA: Diagnosis not present

## 2017-10-18 DIAGNOSIS — N2589 Other disorders resulting from impaired renal tubular function: Secondary | ICD-10-CM | POA: Diagnosis not present

## 2017-10-18 DIAGNOSIS — D509 Iron deficiency anemia, unspecified: Secondary | ICD-10-CM | POA: Diagnosis not present

## 2017-10-18 DIAGNOSIS — Z4931 Encounter for adequacy testing for hemodialysis: Secondary | ICD-10-CM | POA: Diagnosis not present

## 2017-10-18 DIAGNOSIS — D631 Anemia in chronic kidney disease: Secondary | ICD-10-CM | POA: Diagnosis not present

## 2017-10-18 DIAGNOSIS — N186 End stage renal disease: Secondary | ICD-10-CM | POA: Diagnosis not present

## 2017-10-18 DIAGNOSIS — N2581 Secondary hyperparathyroidism of renal origin: Secondary | ICD-10-CM | POA: Diagnosis not present

## 2017-10-19 DIAGNOSIS — N186 End stage renal disease: Secondary | ICD-10-CM | POA: Diagnosis not present

## 2017-10-19 DIAGNOSIS — D509 Iron deficiency anemia, unspecified: Secondary | ICD-10-CM | POA: Diagnosis not present

## 2017-10-19 DIAGNOSIS — D631 Anemia in chronic kidney disease: Secondary | ICD-10-CM | POA: Diagnosis not present

## 2017-10-19 DIAGNOSIS — N2581 Secondary hyperparathyroidism of renal origin: Secondary | ICD-10-CM | POA: Diagnosis not present

## 2017-10-19 DIAGNOSIS — Z4931 Encounter for adequacy testing for hemodialysis: Secondary | ICD-10-CM | POA: Diagnosis not present

## 2017-10-19 DIAGNOSIS — N2589 Other disorders resulting from impaired renal tubular function: Secondary | ICD-10-CM | POA: Diagnosis not present

## 2017-10-20 DIAGNOSIS — N186 End stage renal disease: Secondary | ICD-10-CM | POA: Diagnosis not present

## 2017-10-20 DIAGNOSIS — D631 Anemia in chronic kidney disease: Secondary | ICD-10-CM | POA: Diagnosis not present

## 2017-10-20 DIAGNOSIS — N2581 Secondary hyperparathyroidism of renal origin: Secondary | ICD-10-CM | POA: Diagnosis not present

## 2017-10-20 DIAGNOSIS — Z4931 Encounter for adequacy testing for hemodialysis: Secondary | ICD-10-CM | POA: Diagnosis not present

## 2017-10-20 DIAGNOSIS — D509 Iron deficiency anemia, unspecified: Secondary | ICD-10-CM | POA: Diagnosis not present

## 2017-10-20 DIAGNOSIS — N2589 Other disorders resulting from impaired renal tubular function: Secondary | ICD-10-CM | POA: Diagnosis not present

## 2017-10-21 DIAGNOSIS — Z4931 Encounter for adequacy testing for hemodialysis: Secondary | ICD-10-CM | POA: Diagnosis not present

## 2017-10-21 DIAGNOSIS — N2581 Secondary hyperparathyroidism of renal origin: Secondary | ICD-10-CM | POA: Diagnosis not present

## 2017-10-21 DIAGNOSIS — N186 End stage renal disease: Secondary | ICD-10-CM | POA: Diagnosis not present

## 2017-10-21 DIAGNOSIS — D509 Iron deficiency anemia, unspecified: Secondary | ICD-10-CM | POA: Diagnosis not present

## 2017-10-21 DIAGNOSIS — N2589 Other disorders resulting from impaired renal tubular function: Secondary | ICD-10-CM | POA: Diagnosis not present

## 2017-10-21 DIAGNOSIS — D631 Anemia in chronic kidney disease: Secondary | ICD-10-CM | POA: Diagnosis not present

## 2017-10-22 DIAGNOSIS — D631 Anemia in chronic kidney disease: Secondary | ICD-10-CM | POA: Diagnosis not present

## 2017-10-22 DIAGNOSIS — N2589 Other disorders resulting from impaired renal tubular function: Secondary | ICD-10-CM | POA: Diagnosis not present

## 2017-10-22 DIAGNOSIS — D509 Iron deficiency anemia, unspecified: Secondary | ICD-10-CM | POA: Diagnosis not present

## 2017-10-22 DIAGNOSIS — Z4931 Encounter for adequacy testing for hemodialysis: Secondary | ICD-10-CM | POA: Diagnosis not present

## 2017-10-22 DIAGNOSIS — N186 End stage renal disease: Secondary | ICD-10-CM | POA: Diagnosis not present

## 2017-10-22 DIAGNOSIS — N2581 Secondary hyperparathyroidism of renal origin: Secondary | ICD-10-CM | POA: Diagnosis not present

## 2017-10-23 ENCOUNTER — Other Ambulatory Visit: Payer: Self-pay | Admitting: Family Medicine

## 2017-10-23 DIAGNOSIS — N2581 Secondary hyperparathyroidism of renal origin: Secondary | ICD-10-CM | POA: Diagnosis not present

## 2017-10-23 DIAGNOSIS — D509 Iron deficiency anemia, unspecified: Secondary | ICD-10-CM | POA: Diagnosis not present

## 2017-10-23 DIAGNOSIS — R05 Cough: Secondary | ICD-10-CM

## 2017-10-23 DIAGNOSIS — D631 Anemia in chronic kidney disease: Secondary | ICD-10-CM | POA: Diagnosis not present

## 2017-10-23 DIAGNOSIS — N2589 Other disorders resulting from impaired renal tubular function: Secondary | ICD-10-CM | POA: Diagnosis not present

## 2017-10-23 DIAGNOSIS — N186 End stage renal disease: Secondary | ICD-10-CM | POA: Diagnosis not present

## 2017-10-23 DIAGNOSIS — R053 Chronic cough: Secondary | ICD-10-CM

## 2017-10-23 DIAGNOSIS — T8612 Kidney transplant failure: Secondary | ICD-10-CM | POA: Diagnosis not present

## 2017-10-23 DIAGNOSIS — Z992 Dependence on renal dialysis: Secondary | ICD-10-CM | POA: Diagnosis not present

## 2017-10-23 DIAGNOSIS — Z4931 Encounter for adequacy testing for hemodialysis: Secondary | ICD-10-CM | POA: Diagnosis not present

## 2017-10-24 DIAGNOSIS — E46 Unspecified protein-calorie malnutrition: Secondary | ICD-10-CM | POA: Diagnosis not present

## 2017-10-24 DIAGNOSIS — N2581 Secondary hyperparathyroidism of renal origin: Secondary | ICD-10-CM | POA: Diagnosis not present

## 2017-10-24 DIAGNOSIS — Z992 Dependence on renal dialysis: Secondary | ICD-10-CM | POA: Diagnosis not present

## 2017-10-24 DIAGNOSIS — Z79899 Other long term (current) drug therapy: Secondary | ICD-10-CM | POA: Diagnosis not present

## 2017-10-24 DIAGNOSIS — N186 End stage renal disease: Secondary | ICD-10-CM | POA: Diagnosis not present

## 2017-10-24 DIAGNOSIS — K769 Liver disease, unspecified: Secondary | ICD-10-CM | POA: Diagnosis not present

## 2017-10-24 DIAGNOSIS — D631 Anemia in chronic kidney disease: Secondary | ICD-10-CM | POA: Diagnosis not present

## 2017-10-24 DIAGNOSIS — N2589 Other disorders resulting from impaired renal tubular function: Secondary | ICD-10-CM | POA: Diagnosis not present

## 2017-10-24 DIAGNOSIS — D509 Iron deficiency anemia, unspecified: Secondary | ICD-10-CM | POA: Diagnosis not present

## 2017-10-24 DIAGNOSIS — T8612 Kidney transplant failure: Secondary | ICD-10-CM | POA: Diagnosis not present

## 2017-10-25 DIAGNOSIS — D509 Iron deficiency anemia, unspecified: Secondary | ICD-10-CM | POA: Diagnosis not present

## 2017-10-25 DIAGNOSIS — E46 Unspecified protein-calorie malnutrition: Secondary | ICD-10-CM | POA: Diagnosis not present

## 2017-10-25 DIAGNOSIS — D631 Anemia in chronic kidney disease: Secondary | ICD-10-CM | POA: Diagnosis not present

## 2017-10-25 DIAGNOSIS — N2589 Other disorders resulting from impaired renal tubular function: Secondary | ICD-10-CM | POA: Diagnosis not present

## 2017-10-25 DIAGNOSIS — N2581 Secondary hyperparathyroidism of renal origin: Secondary | ICD-10-CM | POA: Diagnosis not present

## 2017-10-25 DIAGNOSIS — N186 End stage renal disease: Secondary | ICD-10-CM | POA: Diagnosis not present

## 2017-10-26 DIAGNOSIS — N2581 Secondary hyperparathyroidism of renal origin: Secondary | ICD-10-CM | POA: Diagnosis not present

## 2017-10-26 DIAGNOSIS — D509 Iron deficiency anemia, unspecified: Secondary | ICD-10-CM | POA: Diagnosis not present

## 2017-10-26 DIAGNOSIS — D631 Anemia in chronic kidney disease: Secondary | ICD-10-CM | POA: Diagnosis not present

## 2017-10-26 DIAGNOSIS — N2589 Other disorders resulting from impaired renal tubular function: Secondary | ICD-10-CM | POA: Diagnosis not present

## 2017-10-26 DIAGNOSIS — N186 End stage renal disease: Secondary | ICD-10-CM | POA: Diagnosis not present

## 2017-10-26 DIAGNOSIS — E46 Unspecified protein-calorie malnutrition: Secondary | ICD-10-CM | POA: Diagnosis not present

## 2017-10-27 DIAGNOSIS — N186 End stage renal disease: Secondary | ICD-10-CM | POA: Diagnosis not present

## 2017-10-27 DIAGNOSIS — E46 Unspecified protein-calorie malnutrition: Secondary | ICD-10-CM | POA: Diagnosis not present

## 2017-10-27 DIAGNOSIS — D631 Anemia in chronic kidney disease: Secondary | ICD-10-CM | POA: Diagnosis not present

## 2017-10-27 DIAGNOSIS — D509 Iron deficiency anemia, unspecified: Secondary | ICD-10-CM | POA: Diagnosis not present

## 2017-10-27 DIAGNOSIS — N2589 Other disorders resulting from impaired renal tubular function: Secondary | ICD-10-CM | POA: Diagnosis not present

## 2017-10-27 DIAGNOSIS — N2581 Secondary hyperparathyroidism of renal origin: Secondary | ICD-10-CM | POA: Diagnosis not present

## 2017-10-28 DIAGNOSIS — D631 Anemia in chronic kidney disease: Secondary | ICD-10-CM | POA: Diagnosis not present

## 2017-10-28 DIAGNOSIS — D509 Iron deficiency anemia, unspecified: Secondary | ICD-10-CM | POA: Diagnosis not present

## 2017-10-28 DIAGNOSIS — R82998 Other abnormal findings in urine: Secondary | ICD-10-CM | POA: Diagnosis not present

## 2017-10-28 DIAGNOSIS — N2589 Other disorders resulting from impaired renal tubular function: Secondary | ICD-10-CM | POA: Diagnosis not present

## 2017-10-28 DIAGNOSIS — N2581 Secondary hyperparathyroidism of renal origin: Secondary | ICD-10-CM | POA: Diagnosis not present

## 2017-10-28 DIAGNOSIS — N186 End stage renal disease: Secondary | ICD-10-CM | POA: Diagnosis not present

## 2017-10-28 DIAGNOSIS — E46 Unspecified protein-calorie malnutrition: Secondary | ICD-10-CM | POA: Diagnosis not present

## 2017-10-29 ENCOUNTER — Ambulatory Visit
Admission: RE | Admit: 2017-10-29 | Discharge: 2017-10-29 | Disposition: A | Discharge status: Home / Self Care | Payer: BLUE CROSS/BLUE SHIELD | Source: Ambulatory Visit | Attending: Family Medicine | Admitting: Family Medicine

## 2017-10-29 DIAGNOSIS — R05 Cough: Secondary | ICD-10-CM

## 2017-10-29 DIAGNOSIS — N2581 Secondary hyperparathyroidism of renal origin: Secondary | ICD-10-CM | POA: Diagnosis not present

## 2017-10-29 DIAGNOSIS — R053 Chronic cough: Secondary | ICD-10-CM

## 2017-10-29 DIAGNOSIS — D509 Iron deficiency anemia, unspecified: Secondary | ICD-10-CM | POA: Diagnosis not present

## 2017-10-29 DIAGNOSIS — R911 Solitary pulmonary nodule: Secondary | ICD-10-CM | POA: Diagnosis not present

## 2017-10-29 DIAGNOSIS — N2589 Other disorders resulting from impaired renal tubular function: Secondary | ICD-10-CM | POA: Diagnosis not present

## 2017-10-29 DIAGNOSIS — N186 End stage renal disease: Secondary | ICD-10-CM | POA: Diagnosis not present

## 2017-10-29 DIAGNOSIS — E46 Unspecified protein-calorie malnutrition: Secondary | ICD-10-CM | POA: Diagnosis not present

## 2017-10-29 DIAGNOSIS — D631 Anemia in chronic kidney disease: Secondary | ICD-10-CM | POA: Diagnosis not present

## 2017-10-30 ENCOUNTER — Encounter: Payer: Self-pay | Admitting: Neurology

## 2017-10-30 ENCOUNTER — Ambulatory Visit (INDEPENDENT_AMBULATORY_CARE_PROVIDER_SITE_OTHER): Payer: Medicare Other | Admitting: Neurology

## 2017-10-30 VITALS — BP 99/76 | HR 68 | Ht 65.0 in | Wt 146.0 lb

## 2017-10-30 DIAGNOSIS — G4719 Other hypersomnia: Secondary | ICD-10-CM

## 2017-10-30 DIAGNOSIS — D509 Iron deficiency anemia, unspecified: Secondary | ICD-10-CM | POA: Diagnosis not present

## 2017-10-30 DIAGNOSIS — I10 Essential (primary) hypertension: Secondary | ICD-10-CM

## 2017-10-30 DIAGNOSIS — N186 End stage renal disease: Secondary | ICD-10-CM | POA: Diagnosis not present

## 2017-10-30 DIAGNOSIS — I639 Cerebral infarction, unspecified: Secondary | ICD-10-CM

## 2017-10-30 DIAGNOSIS — Z992 Dependence on renal dialysis: Secondary | ICD-10-CM | POA: Diagnosis not present

## 2017-10-30 DIAGNOSIS — R0683 Snoring: Secondary | ICD-10-CM

## 2017-10-30 DIAGNOSIS — T733XXD Exhaustion due to excessive exertion, subsequent encounter: Secondary | ICD-10-CM

## 2017-10-30 DIAGNOSIS — N049 Nephrotic syndrome with unspecified morphologic changes: Secondary | ICD-10-CM

## 2017-10-30 DIAGNOSIS — R4189 Other symptoms and signs involving cognitive functions and awareness: Secondary | ICD-10-CM

## 2017-10-30 DIAGNOSIS — I480 Paroxysmal atrial fibrillation: Secondary | ICD-10-CM

## 2017-10-30 DIAGNOSIS — E46 Unspecified protein-calorie malnutrition: Secondary | ICD-10-CM | POA: Diagnosis not present

## 2017-10-30 DIAGNOSIS — N2581 Secondary hyperparathyroidism of renal origin: Secondary | ICD-10-CM | POA: Diagnosis not present

## 2017-10-30 DIAGNOSIS — N2589 Other disorders resulting from impaired renal tubular function: Secondary | ICD-10-CM | POA: Diagnosis not present

## 2017-10-30 DIAGNOSIS — D631 Anemia in chronic kidney disease: Secondary | ICD-10-CM | POA: Diagnosis not present

## 2017-10-30 NOTE — Progress Notes (Signed)
SLEEP MEDICINE CLINIC   Provider:  Larey [name], Kimberly [name]  Primary Care Physician:  Kimberly Ada, MD   Referring Provider:  Sarina Ill, MD   Chief Complaint  Patient presents with  . New Patient (Initial Visit)    pt alone, rm 11, pt wears a dialysis machine at night, she has coughing spells and they also happen during the night. pt has been told she snores in her sleep. last sleep study unaware of the time frame that she had it completed.     HPI:  Kimberly [name] is a 62 y.o. female , seen here as a patient of my colleague Kimberly. Sarina [name] who followed the patient after a stroke in December 2018.  Here is her introductory note : "Kimberly [name] is a 62 y.o. female here as a referral from Kimberly. Tamala [name] for Follow up on stroke.  Past medical history anemia, asthma, diabetes on peritoneal dialysis, diverticulosis, first-degree AV block, chronic cough, hypertension, hypothyroidism, IgA nephropathy, kidney transplant, renal transplant failure rejection, stroke. She has had multiple strokes, the most recent was in November, her vision went "wonky" and she went to the ED, no inciting events, no illnesses, no trauma, no pain. she still has some loss of peripheral vision left side, her leg feels numb and heavy. She had stopped Aspirin at the time due to procedure. She has had 2 other strokes in the past. She also has an aneurysm. No other focal neurologic deficits, associated symptoms, inciting events or modifiable factors.  Reviewed notes, labs and imaging from outside physicians, which showed:Reviewed emergency room notes, patient was seen in November after she developed decreased peripheral vision loss for 3 days.  Also generalized weakness in the legs.  Patient was sent to the emergency room because CT showed a possible infarct area.  She does home peritoneal dialysis.  She has had numbness in her left hand in the past.  Variable symptoms of weakness and numbness.  MRI brain: Personally  reviewed images and agree with the following: 1. Small acute/early subacute infarction in right occipital lobe. No hemorrhage or mass effect. 2. Stable mild chronic microvascular ischemic changes and mild parenchymal volume loss of the brain.  Carotid dopplers 08/2017:Color duplex indicates minimal heterogeneous plaque, with nohemodynamically significant stenosis by duplex criteria in the extracranial cerebrovascular circulation.  Echo 08/2017: Left ventricle: The cavity size was normal. Systolic function was normal. The estimated ejection fraction was in the range of 60% to 65%. Wall motion was normal; there were no regional wall motion abnormalities. Left ventricular diastolic function   parameters were normal. - Mitral valve: Valve area by pressure half-time: 1.86 cm^2. - Right ventricle: RV appears hypertrophied. - Atrial septum: No defect or patent foramen ovale was identified. " Kimberly [name].     Sleep consultation 10-30-2017 : CD Chief complaint according to patient : Kimberly [name] suffers from hypothyroidism, she is on nightly peritoneal dialysis for end-stage renal disease, she had suffered her third stroke in December 2018, and she reports having a chronic cough which often interrupts her sleep.  As a chief complaint she feels that she is rather too sleepy.  She can sleep all day intermittently and she catches a lot of her sleep needs on the couch.  Sleep habits are as follows: The patient uses peritoneal dialysate a new at around 4 PM, then can go about her business until its nighttime she may fall asleep in the afternoon hours, but she has adhered to a regular bedtime  after 10 PM.  At that time she also hooks up for peritoneal dialysis again.  Some nights she may have trouble falling asleep but most nights she does not.  He describes her bedroom is cool, quiet and dark.  The patient is a bedroom with her husband.  Because of the dialysis catheter she sleeps in a certain position of comfort  on the left side.  She uses one flat pillow for head support. If she is asleep by 1030 or 11 she can usually stay asleep for about 5 hours until 4 AM, she does no longer produce any urine. Sometimes she wakes because she suffers from a chronic cough.  Nonproductive cough for 20 years. She rises between 7.30 and 8 AM.  She feels restored and refreshed at that time.  There is no nausea, diaphoresis or palpitation associated no dizziness.  There are no headaches.   Sleep medical history and family sleep history: Kimberly [name] is familiar with CPAP use as her husband is a user, but she has no relations that have been diagnosed with apnea to her knowledge. Her fatigue has been evident ever since she developed hypothyroidism and kidney disease. The patient suffers from IgA nephropathy which caused the end-stage renal disease and has been on dialysis since 2015. She has had a failing transplant - 11 month. She was very sleepy while still working.   Social history: She is a lifelong nontobacco user, she may drink alcohol once or twice a month, caffeine use only a couple of times a week.  Review of Systems: Out of a complete 14 system review, the patient complains of only the following symptoms, and all other reviewed systems are negative.  Snoring reported by husband. Intermittent  Sleep.  She may fall asleep watching a TV show but she does not feel the irresistible urge to go to sleep when physically active and mentally stimulated.     Epworth score 12/ 24   , Fatigue severity score 54 points   , depression score 1/ 15    Social History   Socioeconomic History  . Marital status: Married    Spouse name: Not on file  . Number of children: 1  . Years of education: Not on file  . Highest education level: Not on file  Social Needs  . Financial resource strain: Not on file  . Food insecurity - worry: Not on file  . Food insecurity - inability: Not on file  . Transportation needs - medical: Not on  file  . Transportation needs - non-medical: Not on file  Occupational History  . Occupation: retired  Tobacco Use  . Smoking status: Never Smoker  . Smokeless tobacco: Never Used  Substance and Sexual Activity  . Alcohol use: Yes    Comment: ocassional  . Drug use: No  . Sexual activity: Yes    Birth control/protection: Post-menopausal  Other Topics Concern  . Not on file  Social History Narrative   Pt lives in [place].  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    Family History  Problem Relation Age of Onset  . Coronary artery disease Mother   . Emphysema Mother        smoked  . Ovarian cancer Mother   . Heart disease Mother   . Stroke Maternal Uncle   . Diabetes Maternal Uncle   . Kidney disease Maternal Uncle   . Liver disease Father     Past Medical History:  Diagnosis Date  .  Anemia   . Asthma   . Diabetes mellitus    medication induced with  steroids  . Diverticulosis   . First degree AV block   . GERD (gastroesophageal reflux disease)   . Heart murmur    child  . History of chronic cough   . History of hiatal hernia   . History of hoarseness   . Hypertension    history; resolved on dialysis  . Hypothyroidism   . IgA nephropathy    on peritoneal dialysis  . Immunosuppression ([place])   . Kidney transplanted 01/2010   [facility], has had some rejection  . Patient on peritoneal dialysis ([place])   . Premature atrial beat   . Premature ventricular beat   . Renal transplant failure and rejection 2012  . Shingles 03/2011  . Stroke [facility]) October 2010, March 2013   cerebral aneurysm    Past Surgical History:  Procedure Laterality Date  . CAPD INSERTION  08/25/2008   open, Kimberly [name]  . CAPD INSERTION N/A 12/14/2013   Procedure: LAPAROSCOPIC INSERTION CONTINUOUS AMBULATORY PERITONEAL DIALYSIS  (CAPD) CATHETER;  Surgeon: [name], MD;  Location: [place];  Service: General;  Laterality: N/A;  . CAPD REMOVAL  03/22/2010   Kimberly [name]    . EYE SURGERY Bilateral    radial keratotomy  . INSERTION OF MESH N/A 12/14/2013   Procedure: INSERTION OF MESH;  Surgeon: [name], MD;  Location: [place];  Service: General;  Laterality: N/A;  . KIDNEY TRANSPLANT Right 01/2010   [place]  . LAPAROSCOPIC LYSIS OF ADHESIONS N/A 12/14/2013   Procedure: LAPAROSCOPIC LYSIS OF ADHESIONS;  Surgeon: [name], MD;  Location: [place];  Service: General;  Laterality: N/A;  . UMBILICAL HERNIA REPAIR  10/25/2009   open with mesh,  Kimberly [name]  . URETER REVISION  04/2011   [facility]  . VENTRAL HERNIA REPAIR N/A 12/14/2013   Procedure: LAPAROSCOPIC VENTRAL HERNIA;  Surgeon: [name], MD;  Location: [place];  Service: General;  Laterality: N/A;  . VIDEO ASSISTED THORACOSCOPY (VATS)/WEDGE RESECTION Left 09/01/2014   Procedure: VIDEO ASSISTED THORACOSCOPY (VATS)/WEDGE RESECTION;  Surgeon: [name], MD;  Location: [place];  Service: Thoracic;  Laterality: Left;  Marland Kitchen VIDEO BRONCHOSCOPY Bilateral 07/05/2014   Procedure: VIDEO BRONCHOSCOPY WITH FLUORO;  Surgeon: [name], MD;  Location: [facility];  Service: Cardiopulmonary;  Laterality: Bilateral;    Current Outpatient Medications  Medication Sig Dispense Refill  . aspirin EC 325 MG tablet Take 1 tablet (325 mg total) by mouth daily. 30 tablet 0  . BREO ELLIPTA 200-25 MCG/INH AEPB Inhale 1 puff into the lungs every evening.    . calcitRIOL (ROCALTROL) 0.5 MCG capsule Take 0.5 mcg by mouth 3 (three) times a week.     . chlorpheniramine (CHLOR-TRIMETON) 4 MG tablet Take 4 mg by mouth every 4 (four) hours as needed for allergies.    . Cholecalciferol (VITAMIN D3) 3000 UNITS TABS Take 3,000 Units by mouth daily.    . cinacalcet (SENSIPAR) 30 MG tablet Take 30 mg by mouth daily with breakfast.     . Digestive Enzymes (DIGESTIVE ENZYME PO) Take 1 capsule by mouth daily.    Marland Kitchen epoetin alfa (EPOGEN,PROCRIT) 60454 UNIT/ML injection Inject 10,000 Units into the skin every 30 (thirty) days.     .  ferrous fumarate (HEMOCYTE - 106 MG FE) 325 (106 FE) MG TABS tablet Take 1 tablet by mouth daily.     Marland Kitchen gentamicin cream (GARAMYCIN) 0.1 %  Apply 1 application topically daily.     Marland Kitchen lanthanum (FOSRENOL) 1000 MG chewable tablet Chew 1,000 mg by mouth daily.     Marland Kitchen levothyroxine (SYNTHROID, LEVOTHROID) 50 MCG tablet Take 50 mcg by mouth daily before breakfast. 72mcg on Sun, Mon, Wed, Fri and Sat. 47mcg on Tues and Thurs.    . metoCLOPramide (REGLAN) 5 MG tablet Take 5 mg by mouth daily as needed for nausea or vomiting.     . potassium chloride SA (K-DUR,KLOR-CON) 20 MEQ tablet Take 20 mEq by mouth daily.    Marland Kitchen SPIRIVA RESPIMAT 1.25 MCG/ACT AERS Inhale 2 puffs into the lungs daily.     No current facility-administered medications for this visit.     Allergies as of 10/30/2017 - Review Complete 10/30/2017  Allergen Reaction Noted  . Other  08/21/2015  . Dapsone Rash 11/29/2011  . Pregabalin Rash and Other (See Comments) 01/17/2012  . Sulfonamide derivatives Rash   . Tramadol Rash 01/14/2014    Vitals: BP 99/76   Pulse 68   Ht 5\' 5"  (1.651 m)   Wt 146 lb (66.2 kg)   BMI 24.30 kg/m  Last Weight:  Wt Readings from Last 1 Encounters:  10/30/17 146 lb (66.2 kg)   TIR:WERX mass index is 24.3 kg/m.     Last Height:   Ht Readings from Last 1 Encounters:  10/30/17 5\' 5"  (1.651 m)    Physical exam:  General: The patient is awake, alert and appears not in acute distress. The patient is well groomed. Head: Normocephalic, atraumatic. Neck is supple. Mallampati 3. Tongue deviated to the right.   neck circumference:14. 5. Nasal airflow congested -she reports postnasal drip, she has a very narrow bridge of the nose, and a septum deviation to the right., TMJ is  Click is not evident . Retrognathia is seen.  Cardiovascular:  Regular rate and rhythm, without  murmurs or carotid bruit, and without distended neck veins. Respiratory: Lungs are clear to auscultation. Skin:  Without evidence of edema,  or rash Trunk: BMI is 24 . The patient's posture is erect  Neurologic exam : The patient is awake and alert, oriented to place and time.   Attention span & concentration ability appears normal.  Speech is fluent,  without dysarthria, but significant dysphonia .  Mood and affect are appropriate.  Cranial nerves: Pupils are equal and briskly reactive to light. Funduscopic exam without evidence of pallor or edema.  Extraocular movements  in vertical and horizontal planes intact and without nystagmus. Visual fields by finger perimetry-intact to finger perimetry. Hearing to finger rub intact. Facial sensation intact to fine touch.  Facial motor strength is symmetric and tongue and uvula move midline. Shoulder shrug was symmetrical.   Motor exam: Normal tone, muscle bulk and symmetric strength -status post extensor tendon synovectomy on the dorsal mannose on the right.  Well-healed scar.  No change in grip strength, no change in handwriting. Sensory: Fine touch, pinprick  were normal. Deep tendon reflexes: in the  upper and lower extremities are symmetric and intact.   Assessment:  After physical and neurologic examination, review of laboratory studies,  Personal review of imaging studies, reports of other /same  Imaging studies, results of polysomnography and / or neurophysiology testing and pre-existing records as far as provided in visit., my assessment is :   Dear Kimberly. Jaynee [name],  Mrs. Zappone has had 3 strokes since 2010.  The first 1 affected her ability to express language, her most recent third 1 affected her  peripheral vision for the left visual field. Reviewing the tests that were done through the stroke service and through Kimberly. Jaynee [name] it seems that an embolic source is most likely.  A TEE was ordered as well as a cardiac monitoring study. None was yet done.     1) given the multiple comorbidities as described in her history of present illness, Mrs. Brierley has a higher risk of having sleep  apnea but she has multiple other contributing factors to fatigue or daytime sleepiness.  She suffers from an autoimmune disease which by definition is associated with a high degree of fatigue, and endocrine abnormality in form of hypothyroidism is contributing, and peritoneal dialysis is also somewhat burdensome and fatiguing.  She has not lost nocturnal sleep but she does not nap in daytime.  However, she does not describes this is a sleep attack as the irresistible urge to go to sleep but rather as a dozing off when mentally not stimulated and physically not active. Due to her comorbidities and the embolic nature of the strokes, as well as documented cardiac arrhythmias in the past, heart block, I will order an attended sleep study.  The study will be performed as a split-night polysomnography to be split at an AHI of 15. She is able to arrange around her peritoneal dialysis schedule. She advised me that she is cold sensitive.  She coughs every night - this may interfere with sleep lab study.  The patient was advised of the nature of the diagnosed disorder , the treatment options and the  risks for general health and wellness arising from not treating the condition.   I spent more than 50 minutes of face to face time with the patient.  Greater than 50% of time was spent in counseling and coordination of care. We have discussed the diagnosis and differential and I answered the patient's questions.    Plan:  Treatment plan and additional workup :   Rv with MD after SPLIT Please check on TEE and cardiac monitoring orders as given in December by Kimberly. Jaynee [name].  Kimberly Seat, MD 05/25/1193, 17:40 AM  Certified in Neurology by ABPN Certified in [place] by [facility] [address] [place], [place] [address]

## 2017-10-31 DIAGNOSIS — D509 Iron deficiency anemia, unspecified: Secondary | ICD-10-CM | POA: Diagnosis not present

## 2017-10-31 DIAGNOSIS — E46 Unspecified protein-calorie malnutrition: Secondary | ICD-10-CM | POA: Diagnosis not present

## 2017-10-31 DIAGNOSIS — N2589 Other disorders resulting from impaired renal tubular function: Secondary | ICD-10-CM | POA: Diagnosis not present

## 2017-10-31 DIAGNOSIS — N186 End stage renal disease: Secondary | ICD-10-CM | POA: Diagnosis not present

## 2017-10-31 DIAGNOSIS — D631 Anemia in chronic kidney disease: Secondary | ICD-10-CM | POA: Diagnosis not present

## 2017-10-31 DIAGNOSIS — N2581 Secondary hyperparathyroidism of renal origin: Secondary | ICD-10-CM | POA: Diagnosis not present

## 2017-11-01 DIAGNOSIS — D631 Anemia in chronic kidney disease: Secondary | ICD-10-CM | POA: Diagnosis not present

## 2017-11-01 DIAGNOSIS — N186 End stage renal disease: Secondary | ICD-10-CM | POA: Diagnosis not present

## 2017-11-01 DIAGNOSIS — N2581 Secondary hyperparathyroidism of renal origin: Secondary | ICD-10-CM | POA: Diagnosis not present

## 2017-11-01 DIAGNOSIS — N2589 Other disorders resulting from impaired renal tubular function: Secondary | ICD-10-CM | POA: Diagnosis not present

## 2017-11-01 DIAGNOSIS — D509 Iron deficiency anemia, unspecified: Secondary | ICD-10-CM | POA: Diagnosis not present

## 2017-11-01 DIAGNOSIS — E46 Unspecified protein-calorie malnutrition: Secondary | ICD-10-CM | POA: Diagnosis not present

## 2017-11-02 DIAGNOSIS — N2581 Secondary hyperparathyroidism of renal origin: Secondary | ICD-10-CM | POA: Diagnosis not present

## 2017-11-02 DIAGNOSIS — D509 Iron deficiency anemia, unspecified: Secondary | ICD-10-CM | POA: Diagnosis not present

## 2017-11-02 DIAGNOSIS — N186 End stage renal disease: Secondary | ICD-10-CM | POA: Diagnosis not present

## 2017-11-02 DIAGNOSIS — N2589 Other disorders resulting from impaired renal tubular function: Secondary | ICD-10-CM | POA: Diagnosis not present

## 2017-11-02 DIAGNOSIS — D631 Anemia in chronic kidney disease: Secondary | ICD-10-CM | POA: Diagnosis not present

## 2017-11-02 DIAGNOSIS — E46 Unspecified protein-calorie malnutrition: Secondary | ICD-10-CM | POA: Diagnosis not present

## 2017-11-03 DIAGNOSIS — N2581 Secondary hyperparathyroidism of renal origin: Secondary | ICD-10-CM | POA: Diagnosis not present

## 2017-11-03 DIAGNOSIS — N2589 Other disorders resulting from impaired renal tubular function: Secondary | ICD-10-CM | POA: Diagnosis not present

## 2017-11-03 DIAGNOSIS — D509 Iron deficiency anemia, unspecified: Secondary | ICD-10-CM | POA: Diagnosis not present

## 2017-11-03 DIAGNOSIS — D631 Anemia in chronic kidney disease: Secondary | ICD-10-CM | POA: Diagnosis not present

## 2017-11-03 DIAGNOSIS — N186 End stage renal disease: Secondary | ICD-10-CM | POA: Diagnosis not present

## 2017-11-03 DIAGNOSIS — E46 Unspecified protein-calorie malnutrition: Secondary | ICD-10-CM | POA: Diagnosis not present

## 2017-11-04 DIAGNOSIS — E46 Unspecified protein-calorie malnutrition: Secondary | ICD-10-CM | POA: Diagnosis not present

## 2017-11-04 DIAGNOSIS — N186 End stage renal disease: Secondary | ICD-10-CM | POA: Diagnosis not present

## 2017-11-04 DIAGNOSIS — N2589 Other disorders resulting from impaired renal tubular function: Secondary | ICD-10-CM | POA: Diagnosis not present

## 2017-11-04 DIAGNOSIS — D509 Iron deficiency anemia, unspecified: Secondary | ICD-10-CM | POA: Diagnosis not present

## 2017-11-04 DIAGNOSIS — D631 Anemia in chronic kidney disease: Secondary | ICD-10-CM | POA: Diagnosis not present

## 2017-11-04 DIAGNOSIS — N2581 Secondary hyperparathyroidism of renal origin: Secondary | ICD-10-CM | POA: Diagnosis not present

## 2017-11-05 DIAGNOSIS — N2581 Secondary hyperparathyroidism of renal origin: Secondary | ICD-10-CM | POA: Diagnosis not present

## 2017-11-05 DIAGNOSIS — N2589 Other disorders resulting from impaired renal tubular function: Secondary | ICD-10-CM | POA: Diagnosis not present

## 2017-11-05 DIAGNOSIS — N186 End stage renal disease: Secondary | ICD-10-CM | POA: Diagnosis not present

## 2017-11-05 DIAGNOSIS — D631 Anemia in chronic kidney disease: Secondary | ICD-10-CM | POA: Diagnosis not present

## 2017-11-05 DIAGNOSIS — D509 Iron deficiency anemia, unspecified: Secondary | ICD-10-CM | POA: Diagnosis not present

## 2017-11-05 DIAGNOSIS — E46 Unspecified protein-calorie malnutrition: Secondary | ICD-10-CM | POA: Diagnosis not present

## 2017-11-06 DIAGNOSIS — E46 Unspecified protein-calorie malnutrition: Secondary | ICD-10-CM | POA: Diagnosis not present

## 2017-11-06 DIAGNOSIS — N186 End stage renal disease: Secondary | ICD-10-CM | POA: Diagnosis not present

## 2017-11-06 DIAGNOSIS — D631 Anemia in chronic kidney disease: Secondary | ICD-10-CM | POA: Diagnosis not present

## 2017-11-06 DIAGNOSIS — D509 Iron deficiency anemia, unspecified: Secondary | ICD-10-CM | POA: Diagnosis not present

## 2017-11-06 DIAGNOSIS — N2581 Secondary hyperparathyroidism of renal origin: Secondary | ICD-10-CM | POA: Diagnosis not present

## 2017-11-06 DIAGNOSIS — N2589 Other disorders resulting from impaired renal tubular function: Secondary | ICD-10-CM | POA: Diagnosis not present

## 2017-11-07 DIAGNOSIS — E46 Unspecified protein-calorie malnutrition: Secondary | ICD-10-CM | POA: Diagnosis not present

## 2017-11-07 DIAGNOSIS — D631 Anemia in chronic kidney disease: Secondary | ICD-10-CM | POA: Diagnosis not present

## 2017-11-07 DIAGNOSIS — N2581 Secondary hyperparathyroidism of renal origin: Secondary | ICD-10-CM | POA: Diagnosis not present

## 2017-11-07 DIAGNOSIS — D509 Iron deficiency anemia, unspecified: Secondary | ICD-10-CM | POA: Diagnosis not present

## 2017-11-07 DIAGNOSIS — N2589 Other disorders resulting from impaired renal tubular function: Secondary | ICD-10-CM | POA: Diagnosis not present

## 2017-11-07 DIAGNOSIS — N186 End stage renal disease: Secondary | ICD-10-CM | POA: Diagnosis not present

## 2017-11-08 DIAGNOSIS — N2589 Other disorders resulting from impaired renal tubular function: Secondary | ICD-10-CM | POA: Diagnosis not present

## 2017-11-08 DIAGNOSIS — D631 Anemia in chronic kidney disease: Secondary | ICD-10-CM | POA: Diagnosis not present

## 2017-11-08 DIAGNOSIS — D509 Iron deficiency anemia, unspecified: Secondary | ICD-10-CM | POA: Diagnosis not present

## 2017-11-08 DIAGNOSIS — N2581 Secondary hyperparathyroidism of renal origin: Secondary | ICD-10-CM | POA: Diagnosis not present

## 2017-11-08 DIAGNOSIS — E46 Unspecified protein-calorie malnutrition: Secondary | ICD-10-CM | POA: Diagnosis not present

## 2017-11-08 DIAGNOSIS — N186 End stage renal disease: Secondary | ICD-10-CM | POA: Diagnosis not present

## 2017-11-09 DIAGNOSIS — E46 Unspecified protein-calorie malnutrition: Secondary | ICD-10-CM | POA: Diagnosis not present

## 2017-11-09 DIAGNOSIS — N186 End stage renal disease: Secondary | ICD-10-CM | POA: Diagnosis not present

## 2017-11-09 DIAGNOSIS — D631 Anemia in chronic kidney disease: Secondary | ICD-10-CM | POA: Diagnosis not present

## 2017-11-09 DIAGNOSIS — D509 Iron deficiency anemia, unspecified: Secondary | ICD-10-CM | POA: Diagnosis not present

## 2017-11-09 DIAGNOSIS — N2581 Secondary hyperparathyroidism of renal origin: Secondary | ICD-10-CM | POA: Diagnosis not present

## 2017-11-09 DIAGNOSIS — N2589 Other disorders resulting from impaired renal tubular function: Secondary | ICD-10-CM | POA: Diagnosis not present

## 2017-11-10 DIAGNOSIS — D509 Iron deficiency anemia, unspecified: Secondary | ICD-10-CM | POA: Diagnosis not present

## 2017-11-10 DIAGNOSIS — D631 Anemia in chronic kidney disease: Secondary | ICD-10-CM | POA: Diagnosis not present

## 2017-11-10 DIAGNOSIS — N2589 Other disorders resulting from impaired renal tubular function: Secondary | ICD-10-CM | POA: Diagnosis not present

## 2017-11-10 DIAGNOSIS — N2581 Secondary hyperparathyroidism of renal origin: Secondary | ICD-10-CM | POA: Diagnosis not present

## 2017-11-10 DIAGNOSIS — E46 Unspecified protein-calorie malnutrition: Secondary | ICD-10-CM | POA: Diagnosis not present

## 2017-11-10 DIAGNOSIS — N186 End stage renal disease: Secondary | ICD-10-CM | POA: Diagnosis not present

## 2017-11-11 DIAGNOSIS — N2589 Other disorders resulting from impaired renal tubular function: Secondary | ICD-10-CM | POA: Diagnosis not present

## 2017-11-11 DIAGNOSIS — D509 Iron deficiency anemia, unspecified: Secondary | ICD-10-CM | POA: Diagnosis not present

## 2017-11-11 DIAGNOSIS — N2581 Secondary hyperparathyroidism of renal origin: Secondary | ICD-10-CM | POA: Diagnosis not present

## 2017-11-11 DIAGNOSIS — D631 Anemia in chronic kidney disease: Secondary | ICD-10-CM | POA: Diagnosis not present

## 2017-11-11 DIAGNOSIS — E46 Unspecified protein-calorie malnutrition: Secondary | ICD-10-CM | POA: Diagnosis not present

## 2017-11-11 DIAGNOSIS — N186 End stage renal disease: Secondary | ICD-10-CM | POA: Diagnosis not present

## 2017-11-12 DIAGNOSIS — N2581 Secondary hyperparathyroidism of renal origin: Secondary | ICD-10-CM | POA: Diagnosis not present

## 2017-11-12 DIAGNOSIS — E46 Unspecified protein-calorie malnutrition: Secondary | ICD-10-CM | POA: Diagnosis not present

## 2017-11-12 DIAGNOSIS — N2589 Other disorders resulting from impaired renal tubular function: Secondary | ICD-10-CM | POA: Diagnosis not present

## 2017-11-12 DIAGNOSIS — D631 Anemia in chronic kidney disease: Secondary | ICD-10-CM | POA: Diagnosis not present

## 2017-11-12 DIAGNOSIS — N186 End stage renal disease: Secondary | ICD-10-CM | POA: Diagnosis not present

## 2017-11-12 DIAGNOSIS — D509 Iron deficiency anemia, unspecified: Secondary | ICD-10-CM | POA: Diagnosis not present

## 2017-11-13 DIAGNOSIS — D631 Anemia in chronic kidney disease: Secondary | ICD-10-CM | POA: Diagnosis not present

## 2017-11-13 DIAGNOSIS — E46 Unspecified protein-calorie malnutrition: Secondary | ICD-10-CM | POA: Diagnosis not present

## 2017-11-13 DIAGNOSIS — N2581 Secondary hyperparathyroidism of renal origin: Secondary | ICD-10-CM | POA: Diagnosis not present

## 2017-11-13 DIAGNOSIS — N2589 Other disorders resulting from impaired renal tubular function: Secondary | ICD-10-CM | POA: Diagnosis not present

## 2017-11-13 DIAGNOSIS — D509 Iron deficiency anemia, unspecified: Secondary | ICD-10-CM | POA: Diagnosis not present

## 2017-11-13 DIAGNOSIS — N186 End stage renal disease: Secondary | ICD-10-CM | POA: Diagnosis not present

## 2017-11-14 DIAGNOSIS — D631 Anemia in chronic kidney disease: Secondary | ICD-10-CM | POA: Diagnosis not present

## 2017-11-14 DIAGNOSIS — N2581 Secondary hyperparathyroidism of renal origin: Secondary | ICD-10-CM | POA: Diagnosis not present

## 2017-11-14 DIAGNOSIS — D509 Iron deficiency anemia, unspecified: Secondary | ICD-10-CM | POA: Diagnosis not present

## 2017-11-14 DIAGNOSIS — E46 Unspecified protein-calorie malnutrition: Secondary | ICD-10-CM | POA: Diagnosis not present

## 2017-11-14 DIAGNOSIS — N2589 Other disorders resulting from impaired renal tubular function: Secondary | ICD-10-CM | POA: Diagnosis not present

## 2017-11-14 DIAGNOSIS — N186 End stage renal disease: Secondary | ICD-10-CM | POA: Diagnosis not present

## 2017-11-15 DIAGNOSIS — N186 End stage renal disease: Secondary | ICD-10-CM | POA: Diagnosis not present

## 2017-11-15 DIAGNOSIS — E46 Unspecified protein-calorie malnutrition: Secondary | ICD-10-CM | POA: Diagnosis not present

## 2017-11-15 DIAGNOSIS — D509 Iron deficiency anemia, unspecified: Secondary | ICD-10-CM | POA: Diagnosis not present

## 2017-11-15 DIAGNOSIS — N2589 Other disorders resulting from impaired renal tubular function: Secondary | ICD-10-CM | POA: Diagnosis not present

## 2017-11-15 DIAGNOSIS — D631 Anemia in chronic kidney disease: Secondary | ICD-10-CM | POA: Diagnosis not present

## 2017-11-15 DIAGNOSIS — N2581 Secondary hyperparathyroidism of renal origin: Secondary | ICD-10-CM | POA: Diagnosis not present

## 2017-11-16 DIAGNOSIS — N2589 Other disorders resulting from impaired renal tubular function: Secondary | ICD-10-CM | POA: Diagnosis not present

## 2017-11-16 DIAGNOSIS — N186 End stage renal disease: Secondary | ICD-10-CM | POA: Diagnosis not present

## 2017-11-16 DIAGNOSIS — N2581 Secondary hyperparathyroidism of renal origin: Secondary | ICD-10-CM | POA: Diagnosis not present

## 2017-11-16 DIAGNOSIS — E46 Unspecified protein-calorie malnutrition: Secondary | ICD-10-CM | POA: Diagnosis not present

## 2017-11-16 DIAGNOSIS — D631 Anemia in chronic kidney disease: Secondary | ICD-10-CM | POA: Diagnosis not present

## 2017-11-16 DIAGNOSIS — D509 Iron deficiency anemia, unspecified: Secondary | ICD-10-CM | POA: Diagnosis not present

## 2017-11-17 DIAGNOSIS — E46 Unspecified protein-calorie malnutrition: Secondary | ICD-10-CM | POA: Diagnosis not present

## 2017-11-17 DIAGNOSIS — D631 Anemia in chronic kidney disease: Secondary | ICD-10-CM | POA: Diagnosis not present

## 2017-11-17 DIAGNOSIS — N2581 Secondary hyperparathyroidism of renal origin: Secondary | ICD-10-CM | POA: Diagnosis not present

## 2017-11-17 DIAGNOSIS — D509 Iron deficiency anemia, unspecified: Secondary | ICD-10-CM | POA: Diagnosis not present

## 2017-11-17 DIAGNOSIS — N186 End stage renal disease: Secondary | ICD-10-CM | POA: Diagnosis not present

## 2017-11-17 DIAGNOSIS — N2589 Other disorders resulting from impaired renal tubular function: Secondary | ICD-10-CM | POA: Diagnosis not present

## 2017-11-18 DIAGNOSIS — D509 Iron deficiency anemia, unspecified: Secondary | ICD-10-CM | POA: Diagnosis not present

## 2017-11-18 DIAGNOSIS — N186 End stage renal disease: Secondary | ICD-10-CM | POA: Diagnosis not present

## 2017-11-18 DIAGNOSIS — N2581 Secondary hyperparathyroidism of renal origin: Secondary | ICD-10-CM | POA: Diagnosis not present

## 2017-11-18 DIAGNOSIS — E46 Unspecified protein-calorie malnutrition: Secondary | ICD-10-CM | POA: Diagnosis not present

## 2017-11-18 DIAGNOSIS — N2589 Other disorders resulting from impaired renal tubular function: Secondary | ICD-10-CM | POA: Diagnosis not present

## 2017-11-18 DIAGNOSIS — D631 Anemia in chronic kidney disease: Secondary | ICD-10-CM | POA: Diagnosis not present

## 2017-11-19 DIAGNOSIS — N2589 Other disorders resulting from impaired renal tubular function: Secondary | ICD-10-CM | POA: Diagnosis not present

## 2017-11-19 DIAGNOSIS — E46 Unspecified protein-calorie malnutrition: Secondary | ICD-10-CM | POA: Diagnosis not present

## 2017-11-19 DIAGNOSIS — D631 Anemia in chronic kidney disease: Secondary | ICD-10-CM | POA: Diagnosis not present

## 2017-11-19 DIAGNOSIS — D509 Iron deficiency anemia, unspecified: Secondary | ICD-10-CM | POA: Diagnosis not present

## 2017-11-19 DIAGNOSIS — N2581 Secondary hyperparathyroidism of renal origin: Secondary | ICD-10-CM | POA: Diagnosis not present

## 2017-11-19 DIAGNOSIS — N186 End stage renal disease: Secondary | ICD-10-CM | POA: Diagnosis not present

## 2017-11-20 DIAGNOSIS — D631 Anemia in chronic kidney disease: Secondary | ICD-10-CM | POA: Diagnosis not present

## 2017-11-20 DIAGNOSIS — N2589 Other disorders resulting from impaired renal tubular function: Secondary | ICD-10-CM | POA: Diagnosis not present

## 2017-11-20 DIAGNOSIS — D509 Iron deficiency anemia, unspecified: Secondary | ICD-10-CM | POA: Diagnosis not present

## 2017-11-20 DIAGNOSIS — N2581 Secondary hyperparathyroidism of renal origin: Secondary | ICD-10-CM | POA: Diagnosis not present

## 2017-11-20 DIAGNOSIS — E46 Unspecified protein-calorie malnutrition: Secondary | ICD-10-CM | POA: Diagnosis not present

## 2017-11-20 DIAGNOSIS — N186 End stage renal disease: Secondary | ICD-10-CM | POA: Diagnosis not present

## 2017-11-21 DIAGNOSIS — E46 Unspecified protein-calorie malnutrition: Secondary | ICD-10-CM | POA: Diagnosis not present

## 2017-11-21 DIAGNOSIS — D509 Iron deficiency anemia, unspecified: Secondary | ICD-10-CM | POA: Diagnosis not present

## 2017-11-21 DIAGNOSIS — K769 Liver disease, unspecified: Secondary | ICD-10-CM | POA: Diagnosis not present

## 2017-11-21 DIAGNOSIS — N2581 Secondary hyperparathyroidism of renal origin: Secondary | ICD-10-CM | POA: Diagnosis not present

## 2017-11-21 DIAGNOSIS — T8612 Kidney transplant failure: Secondary | ICD-10-CM | POA: Diagnosis not present

## 2017-11-21 DIAGNOSIS — N2589 Other disorders resulting from impaired renal tubular function: Secondary | ICD-10-CM | POA: Diagnosis not present

## 2017-11-21 DIAGNOSIS — N186 End stage renal disease: Secondary | ICD-10-CM | POA: Diagnosis not present

## 2017-11-21 DIAGNOSIS — Z79899 Other long term (current) drug therapy: Secondary | ICD-10-CM | POA: Diagnosis not present

## 2017-11-21 DIAGNOSIS — D631 Anemia in chronic kidney disease: Secondary | ICD-10-CM | POA: Diagnosis not present

## 2017-11-21 DIAGNOSIS — Z992 Dependence on renal dialysis: Secondary | ICD-10-CM | POA: Diagnosis not present

## 2017-11-22 DIAGNOSIS — D509 Iron deficiency anemia, unspecified: Secondary | ICD-10-CM | POA: Diagnosis not present

## 2017-11-22 DIAGNOSIS — K769 Liver disease, unspecified: Secondary | ICD-10-CM | POA: Diagnosis not present

## 2017-11-22 DIAGNOSIS — N186 End stage renal disease: Secondary | ICD-10-CM | POA: Diagnosis not present

## 2017-11-22 DIAGNOSIS — Z79899 Other long term (current) drug therapy: Secondary | ICD-10-CM | POA: Diagnosis not present

## 2017-11-22 DIAGNOSIS — D631 Anemia in chronic kidney disease: Secondary | ICD-10-CM | POA: Diagnosis not present

## 2017-11-22 DIAGNOSIS — N2581 Secondary hyperparathyroidism of renal origin: Secondary | ICD-10-CM | POA: Diagnosis not present

## 2017-11-23 DIAGNOSIS — D509 Iron deficiency anemia, unspecified: Secondary | ICD-10-CM | POA: Diagnosis not present

## 2017-11-23 DIAGNOSIS — N2581 Secondary hyperparathyroidism of renal origin: Secondary | ICD-10-CM | POA: Diagnosis not present

## 2017-11-23 DIAGNOSIS — Z79899 Other long term (current) drug therapy: Secondary | ICD-10-CM | POA: Diagnosis not present

## 2017-11-23 DIAGNOSIS — N186 End stage renal disease: Secondary | ICD-10-CM | POA: Diagnosis not present

## 2017-11-23 DIAGNOSIS — D631 Anemia in chronic kidney disease: Secondary | ICD-10-CM | POA: Diagnosis not present

## 2017-11-23 DIAGNOSIS — K769 Liver disease, unspecified: Secondary | ICD-10-CM | POA: Diagnosis not present

## 2017-11-24 DIAGNOSIS — Z79899 Other long term (current) drug therapy: Secondary | ICD-10-CM | POA: Diagnosis not present

## 2017-11-24 DIAGNOSIS — D631 Anemia in chronic kidney disease: Secondary | ICD-10-CM | POA: Diagnosis not present

## 2017-11-24 DIAGNOSIS — N186 End stage renal disease: Secondary | ICD-10-CM | POA: Diagnosis not present

## 2017-11-24 DIAGNOSIS — D509 Iron deficiency anemia, unspecified: Secondary | ICD-10-CM | POA: Diagnosis not present

## 2017-11-24 DIAGNOSIS — K769 Liver disease, unspecified: Secondary | ICD-10-CM | POA: Diagnosis not present

## 2017-11-24 DIAGNOSIS — N2581 Secondary hyperparathyroidism of renal origin: Secondary | ICD-10-CM | POA: Diagnosis not present

## 2017-11-25 DIAGNOSIS — D631 Anemia in chronic kidney disease: Secondary | ICD-10-CM | POA: Diagnosis not present

## 2017-11-25 DIAGNOSIS — N2581 Secondary hyperparathyroidism of renal origin: Secondary | ICD-10-CM | POA: Diagnosis not present

## 2017-11-25 DIAGNOSIS — D509 Iron deficiency anemia, unspecified: Secondary | ICD-10-CM | POA: Diagnosis not present

## 2017-11-25 DIAGNOSIS — K769 Liver disease, unspecified: Secondary | ICD-10-CM | POA: Diagnosis not present

## 2017-11-25 DIAGNOSIS — Z79899 Other long term (current) drug therapy: Secondary | ICD-10-CM | POA: Diagnosis not present

## 2017-11-25 DIAGNOSIS — N186 End stage renal disease: Secondary | ICD-10-CM | POA: Diagnosis not present

## 2017-11-26 DIAGNOSIS — N186 End stage renal disease: Secondary | ICD-10-CM | POA: Diagnosis not present

## 2017-11-26 DIAGNOSIS — N9089 Other specified noninflammatory disorders of vulva and perineum: Secondary | ICD-10-CM | POA: Diagnosis not present

## 2017-11-26 DIAGNOSIS — K769 Liver disease, unspecified: Secondary | ICD-10-CM | POA: Diagnosis not present

## 2017-11-26 DIAGNOSIS — Z79899 Other long term (current) drug therapy: Secondary | ICD-10-CM | POA: Diagnosis not present

## 2017-11-26 DIAGNOSIS — D631 Anemia in chronic kidney disease: Secondary | ICD-10-CM | POA: Diagnosis not present

## 2017-11-26 DIAGNOSIS — D509 Iron deficiency anemia, unspecified: Secondary | ICD-10-CM | POA: Diagnosis not present

## 2017-11-26 DIAGNOSIS — N2581 Secondary hyperparathyroidism of renal origin: Secondary | ICD-10-CM | POA: Diagnosis not present

## 2017-11-27 DIAGNOSIS — R82998 Other abnormal findings in urine: Secondary | ICD-10-CM | POA: Diagnosis not present

## 2017-11-27 DIAGNOSIS — D509 Iron deficiency anemia, unspecified: Secondary | ICD-10-CM | POA: Diagnosis not present

## 2017-11-27 DIAGNOSIS — K769 Liver disease, unspecified: Secondary | ICD-10-CM | POA: Diagnosis not present

## 2017-11-27 DIAGNOSIS — N2581 Secondary hyperparathyroidism of renal origin: Secondary | ICD-10-CM | POA: Diagnosis not present

## 2017-11-27 DIAGNOSIS — D631 Anemia in chronic kidney disease: Secondary | ICD-10-CM | POA: Diagnosis not present

## 2017-11-27 DIAGNOSIS — Z79899 Other long term (current) drug therapy: Secondary | ICD-10-CM | POA: Diagnosis not present

## 2017-11-27 DIAGNOSIS — N186 End stage renal disease: Secondary | ICD-10-CM | POA: Diagnosis not present

## 2017-11-28 DIAGNOSIS — D509 Iron deficiency anemia, unspecified: Secondary | ICD-10-CM | POA: Diagnosis not present

## 2017-11-28 DIAGNOSIS — N186 End stage renal disease: Secondary | ICD-10-CM | POA: Diagnosis not present

## 2017-11-28 DIAGNOSIS — Z79899 Other long term (current) drug therapy: Secondary | ICD-10-CM | POA: Diagnosis not present

## 2017-11-28 DIAGNOSIS — K769 Liver disease, unspecified: Secondary | ICD-10-CM | POA: Diagnosis not present

## 2017-11-28 DIAGNOSIS — N2581 Secondary hyperparathyroidism of renal origin: Secondary | ICD-10-CM | POA: Diagnosis not present

## 2017-11-28 DIAGNOSIS — D631 Anemia in chronic kidney disease: Secondary | ICD-10-CM | POA: Diagnosis not present

## 2017-11-29 DIAGNOSIS — Z79899 Other long term (current) drug therapy: Secondary | ICD-10-CM | POA: Diagnosis not present

## 2017-11-29 DIAGNOSIS — D509 Iron deficiency anemia, unspecified: Secondary | ICD-10-CM | POA: Diagnosis not present

## 2017-11-29 DIAGNOSIS — N186 End stage renal disease: Secondary | ICD-10-CM | POA: Diagnosis not present

## 2017-11-29 DIAGNOSIS — N2581 Secondary hyperparathyroidism of renal origin: Secondary | ICD-10-CM | POA: Diagnosis not present

## 2017-11-29 DIAGNOSIS — K769 Liver disease, unspecified: Secondary | ICD-10-CM | POA: Diagnosis not present

## 2017-11-29 DIAGNOSIS — D631 Anemia in chronic kidney disease: Secondary | ICD-10-CM | POA: Diagnosis not present

## 2017-11-30 DIAGNOSIS — D509 Iron deficiency anemia, unspecified: Secondary | ICD-10-CM | POA: Diagnosis not present

## 2017-11-30 DIAGNOSIS — D631 Anemia in chronic kidney disease: Secondary | ICD-10-CM | POA: Diagnosis not present

## 2017-11-30 DIAGNOSIS — N186 End stage renal disease: Secondary | ICD-10-CM | POA: Diagnosis not present

## 2017-11-30 DIAGNOSIS — Z79899 Other long term (current) drug therapy: Secondary | ICD-10-CM | POA: Diagnosis not present

## 2017-11-30 DIAGNOSIS — N2581 Secondary hyperparathyroidism of renal origin: Secondary | ICD-10-CM | POA: Diagnosis not present

## 2017-11-30 DIAGNOSIS — K769 Liver disease, unspecified: Secondary | ICD-10-CM | POA: Diagnosis not present

## 2017-12-01 DIAGNOSIS — Z79899 Other long term (current) drug therapy: Secondary | ICD-10-CM | POA: Diagnosis not present

## 2017-12-01 DIAGNOSIS — D509 Iron deficiency anemia, unspecified: Secondary | ICD-10-CM | POA: Diagnosis not present

## 2017-12-01 DIAGNOSIS — N186 End stage renal disease: Secondary | ICD-10-CM | POA: Diagnosis not present

## 2017-12-01 DIAGNOSIS — N2581 Secondary hyperparathyroidism of renal origin: Secondary | ICD-10-CM | POA: Diagnosis not present

## 2017-12-01 DIAGNOSIS — K769 Liver disease, unspecified: Secondary | ICD-10-CM | POA: Diagnosis not present

## 2017-12-01 DIAGNOSIS — D631 Anemia in chronic kidney disease: Secondary | ICD-10-CM | POA: Diagnosis not present

## 2017-12-02 DIAGNOSIS — D631 Anemia in chronic kidney disease: Secondary | ICD-10-CM | POA: Diagnosis not present

## 2017-12-02 DIAGNOSIS — Z79899 Other long term (current) drug therapy: Secondary | ICD-10-CM | POA: Diagnosis not present

## 2017-12-02 DIAGNOSIS — N186 End stage renal disease: Secondary | ICD-10-CM | POA: Diagnosis not present

## 2017-12-02 DIAGNOSIS — N2581 Secondary hyperparathyroidism of renal origin: Secondary | ICD-10-CM | POA: Diagnosis not present

## 2017-12-02 DIAGNOSIS — D509 Iron deficiency anemia, unspecified: Secondary | ICD-10-CM | POA: Diagnosis not present

## 2017-12-02 DIAGNOSIS — K769 Liver disease, unspecified: Secondary | ICD-10-CM | POA: Diagnosis not present

## 2017-12-03 DIAGNOSIS — Z79899 Other long term (current) drug therapy: Secondary | ICD-10-CM | POA: Diagnosis not present

## 2017-12-03 DIAGNOSIS — H5340 Unspecified visual field defects: Secondary | ICD-10-CM | POA: Diagnosis not present

## 2017-12-03 DIAGNOSIS — N2581 Secondary hyperparathyroidism of renal origin: Secondary | ICD-10-CM | POA: Diagnosis not present

## 2017-12-03 DIAGNOSIS — D509 Iron deficiency anemia, unspecified: Secondary | ICD-10-CM | POA: Diagnosis not present

## 2017-12-03 DIAGNOSIS — K769 Liver disease, unspecified: Secondary | ICD-10-CM | POA: Diagnosis not present

## 2017-12-03 DIAGNOSIS — D631 Anemia in chronic kidney disease: Secondary | ICD-10-CM | POA: Diagnosis not present

## 2017-12-03 DIAGNOSIS — N186 End stage renal disease: Secondary | ICD-10-CM | POA: Diagnosis not present

## 2017-12-03 DIAGNOSIS — H524 Presbyopia: Secondary | ICD-10-CM | POA: Diagnosis not present

## 2017-12-04 DIAGNOSIS — D509 Iron deficiency anemia, unspecified: Secondary | ICD-10-CM | POA: Diagnosis not present

## 2017-12-04 DIAGNOSIS — N2581 Secondary hyperparathyroidism of renal origin: Secondary | ICD-10-CM | POA: Diagnosis not present

## 2017-12-04 DIAGNOSIS — K769 Liver disease, unspecified: Secondary | ICD-10-CM | POA: Diagnosis not present

## 2017-12-04 DIAGNOSIS — D631 Anemia in chronic kidney disease: Secondary | ICD-10-CM | POA: Diagnosis not present

## 2017-12-04 DIAGNOSIS — N186 End stage renal disease: Secondary | ICD-10-CM | POA: Diagnosis not present

## 2017-12-04 DIAGNOSIS — Z79899 Other long term (current) drug therapy: Secondary | ICD-10-CM | POA: Diagnosis not present

## 2017-12-05 DIAGNOSIS — N186 End stage renal disease: Secondary | ICD-10-CM | POA: Diagnosis not present

## 2017-12-05 DIAGNOSIS — D509 Iron deficiency anemia, unspecified: Secondary | ICD-10-CM | POA: Diagnosis not present

## 2017-12-05 DIAGNOSIS — Z79899 Other long term (current) drug therapy: Secondary | ICD-10-CM | POA: Diagnosis not present

## 2017-12-05 DIAGNOSIS — N2581 Secondary hyperparathyroidism of renal origin: Secondary | ICD-10-CM | POA: Diagnosis not present

## 2017-12-05 DIAGNOSIS — D631 Anemia in chronic kidney disease: Secondary | ICD-10-CM | POA: Diagnosis not present

## 2017-12-05 DIAGNOSIS — K769 Liver disease, unspecified: Secondary | ICD-10-CM | POA: Diagnosis not present

## 2017-12-06 DIAGNOSIS — N2581 Secondary hyperparathyroidism of renal origin: Secondary | ICD-10-CM | POA: Diagnosis not present

## 2017-12-06 DIAGNOSIS — D509 Iron deficiency anemia, unspecified: Secondary | ICD-10-CM | POA: Diagnosis not present

## 2017-12-06 DIAGNOSIS — D631 Anemia in chronic kidney disease: Secondary | ICD-10-CM | POA: Diagnosis not present

## 2017-12-06 DIAGNOSIS — K769 Liver disease, unspecified: Secondary | ICD-10-CM | POA: Diagnosis not present

## 2017-12-06 DIAGNOSIS — Z79899 Other long term (current) drug therapy: Secondary | ICD-10-CM | POA: Diagnosis not present

## 2017-12-06 DIAGNOSIS — N186 End stage renal disease: Secondary | ICD-10-CM | POA: Diagnosis not present

## 2017-12-07 DIAGNOSIS — N186 End stage renal disease: Secondary | ICD-10-CM | POA: Diagnosis not present

## 2017-12-07 DIAGNOSIS — Z79899 Other long term (current) drug therapy: Secondary | ICD-10-CM | POA: Diagnosis not present

## 2017-12-07 DIAGNOSIS — N2581 Secondary hyperparathyroidism of renal origin: Secondary | ICD-10-CM | POA: Diagnosis not present

## 2017-12-07 DIAGNOSIS — D509 Iron deficiency anemia, unspecified: Secondary | ICD-10-CM | POA: Diagnosis not present

## 2017-12-07 DIAGNOSIS — K769 Liver disease, unspecified: Secondary | ICD-10-CM | POA: Diagnosis not present

## 2017-12-07 DIAGNOSIS — D631 Anemia in chronic kidney disease: Secondary | ICD-10-CM | POA: Diagnosis not present

## 2017-12-08 DIAGNOSIS — N2581 Secondary hyperparathyroidism of renal origin: Secondary | ICD-10-CM | POA: Diagnosis not present

## 2017-12-08 DIAGNOSIS — D509 Iron deficiency anemia, unspecified: Secondary | ICD-10-CM | POA: Diagnosis not present

## 2017-12-08 DIAGNOSIS — D631 Anemia in chronic kidney disease: Secondary | ICD-10-CM | POA: Diagnosis not present

## 2017-12-08 DIAGNOSIS — Z79899 Other long term (current) drug therapy: Secondary | ICD-10-CM | POA: Diagnosis not present

## 2017-12-08 DIAGNOSIS — K769 Liver disease, unspecified: Secondary | ICD-10-CM | POA: Diagnosis not present

## 2017-12-08 DIAGNOSIS — N186 End stage renal disease: Secondary | ICD-10-CM | POA: Diagnosis not present

## 2017-12-09 DIAGNOSIS — Z79899 Other long term (current) drug therapy: Secondary | ICD-10-CM | POA: Diagnosis not present

## 2017-12-09 DIAGNOSIS — D509 Iron deficiency anemia, unspecified: Secondary | ICD-10-CM | POA: Diagnosis not present

## 2017-12-09 DIAGNOSIS — N2581 Secondary hyperparathyroidism of renal origin: Secondary | ICD-10-CM | POA: Diagnosis not present

## 2017-12-09 DIAGNOSIS — D631 Anemia in chronic kidney disease: Secondary | ICD-10-CM | POA: Diagnosis not present

## 2017-12-09 DIAGNOSIS — K769 Liver disease, unspecified: Secondary | ICD-10-CM | POA: Diagnosis not present

## 2017-12-09 DIAGNOSIS — N186 End stage renal disease: Secondary | ICD-10-CM | POA: Diagnosis not present

## 2017-12-10 DIAGNOSIS — Z79899 Other long term (current) drug therapy: Secondary | ICD-10-CM | POA: Diagnosis not present

## 2017-12-10 DIAGNOSIS — N186 End stage renal disease: Secondary | ICD-10-CM | POA: Diagnosis not present

## 2017-12-10 DIAGNOSIS — D509 Iron deficiency anemia, unspecified: Secondary | ICD-10-CM | POA: Diagnosis not present

## 2017-12-10 DIAGNOSIS — K769 Liver disease, unspecified: Secondary | ICD-10-CM | POA: Diagnosis not present

## 2017-12-10 DIAGNOSIS — D631 Anemia in chronic kidney disease: Secondary | ICD-10-CM | POA: Diagnosis not present

## 2017-12-10 DIAGNOSIS — N2581 Secondary hyperparathyroidism of renal origin: Secondary | ICD-10-CM | POA: Diagnosis not present

## 2017-12-11 ENCOUNTER — Ambulatory Visit (INDEPENDENT_AMBULATORY_CARE_PROVIDER_SITE_OTHER): Payer: Medicare Other | Admitting: Neurology

## 2017-12-11 DIAGNOSIS — R4189 Other symptoms and signs involving cognitive functions and awareness: Secondary | ICD-10-CM

## 2017-12-11 DIAGNOSIS — N186 End stage renal disease: Secondary | ICD-10-CM

## 2017-12-11 DIAGNOSIS — D631 Anemia in chronic kidney disease: Secondary | ICD-10-CM | POA: Diagnosis not present

## 2017-12-11 DIAGNOSIS — G4710 Hypersomnia, unspecified: Secondary | ICD-10-CM | POA: Diagnosis not present

## 2017-12-11 DIAGNOSIS — N049 Nephrotic syndrome with unspecified morphologic changes: Secondary | ICD-10-CM

## 2017-12-11 DIAGNOSIS — G4719 Other hypersomnia: Secondary | ICD-10-CM

## 2017-12-11 DIAGNOSIS — Z992 Dependence on renal dialysis: Secondary | ICD-10-CM

## 2017-12-11 DIAGNOSIS — I63419 Cerebral infarction due to embolism of unspecified middle cerebral artery: Secondary | ICD-10-CM

## 2017-12-11 DIAGNOSIS — I480 Paroxysmal atrial fibrillation: Secondary | ICD-10-CM

## 2017-12-11 DIAGNOSIS — N2581 Secondary hyperparathyroidism of renal origin: Secondary | ICD-10-CM | POA: Diagnosis not present

## 2017-12-11 DIAGNOSIS — D509 Iron deficiency anemia, unspecified: Secondary | ICD-10-CM | POA: Diagnosis not present

## 2017-12-11 DIAGNOSIS — Z79899 Other long term (current) drug therapy: Secondary | ICD-10-CM | POA: Diagnosis not present

## 2017-12-11 DIAGNOSIS — R0683 Snoring: Secondary | ICD-10-CM

## 2017-12-11 DIAGNOSIS — I10 Essential (primary) hypertension: Secondary | ICD-10-CM

## 2017-12-11 DIAGNOSIS — K769 Liver disease, unspecified: Secondary | ICD-10-CM | POA: Diagnosis not present

## 2017-12-12 DIAGNOSIS — D509 Iron deficiency anemia, unspecified: Secondary | ICD-10-CM | POA: Diagnosis not present

## 2017-12-12 DIAGNOSIS — N186 End stage renal disease: Secondary | ICD-10-CM | POA: Diagnosis not present

## 2017-12-12 DIAGNOSIS — D631 Anemia in chronic kidney disease: Secondary | ICD-10-CM | POA: Diagnosis not present

## 2017-12-12 DIAGNOSIS — K769 Liver disease, unspecified: Secondary | ICD-10-CM | POA: Diagnosis not present

## 2017-12-12 DIAGNOSIS — Z79899 Other long term (current) drug therapy: Secondary | ICD-10-CM | POA: Diagnosis not present

## 2017-12-12 DIAGNOSIS — N2581 Secondary hyperparathyroidism of renal origin: Secondary | ICD-10-CM | POA: Diagnosis not present

## 2017-12-13 DIAGNOSIS — N2581 Secondary hyperparathyroidism of renal origin: Secondary | ICD-10-CM | POA: Diagnosis not present

## 2017-12-13 DIAGNOSIS — Z79899 Other long term (current) drug therapy: Secondary | ICD-10-CM | POA: Diagnosis not present

## 2017-12-13 DIAGNOSIS — K769 Liver disease, unspecified: Secondary | ICD-10-CM | POA: Diagnosis not present

## 2017-12-13 DIAGNOSIS — D631 Anemia in chronic kidney disease: Secondary | ICD-10-CM | POA: Diagnosis not present

## 2017-12-13 DIAGNOSIS — N186 End stage renal disease: Secondary | ICD-10-CM | POA: Diagnosis not present

## 2017-12-13 DIAGNOSIS — D509 Iron deficiency anemia, unspecified: Secondary | ICD-10-CM | POA: Diagnosis not present

## 2017-12-14 DIAGNOSIS — D631 Anemia in chronic kidney disease: Secondary | ICD-10-CM | POA: Diagnosis not present

## 2017-12-14 DIAGNOSIS — N2581 Secondary hyperparathyroidism of renal origin: Secondary | ICD-10-CM | POA: Diagnosis not present

## 2017-12-14 DIAGNOSIS — N186 End stage renal disease: Secondary | ICD-10-CM | POA: Diagnosis not present

## 2017-12-14 DIAGNOSIS — D509 Iron deficiency anemia, unspecified: Secondary | ICD-10-CM | POA: Diagnosis not present

## 2017-12-14 DIAGNOSIS — K769 Liver disease, unspecified: Secondary | ICD-10-CM | POA: Diagnosis not present

## 2017-12-14 DIAGNOSIS — Z79899 Other long term (current) drug therapy: Secondary | ICD-10-CM | POA: Diagnosis not present

## 2017-12-15 DIAGNOSIS — Z79899 Other long term (current) drug therapy: Secondary | ICD-10-CM | POA: Diagnosis not present

## 2017-12-15 DIAGNOSIS — D631 Anemia in chronic kidney disease: Secondary | ICD-10-CM | POA: Diagnosis not present

## 2017-12-15 DIAGNOSIS — D509 Iron deficiency anemia, unspecified: Secondary | ICD-10-CM | POA: Diagnosis not present

## 2017-12-15 DIAGNOSIS — N186 End stage renal disease: Secondary | ICD-10-CM | POA: Diagnosis not present

## 2017-12-15 DIAGNOSIS — N2581 Secondary hyperparathyroidism of renal origin: Secondary | ICD-10-CM | POA: Diagnosis not present

## 2017-12-15 DIAGNOSIS — K769 Liver disease, unspecified: Secondary | ICD-10-CM | POA: Diagnosis not present

## 2017-12-16 DIAGNOSIS — N2581 Secondary hyperparathyroidism of renal origin: Secondary | ICD-10-CM | POA: Diagnosis not present

## 2017-12-16 DIAGNOSIS — D631 Anemia in chronic kidney disease: Secondary | ICD-10-CM | POA: Diagnosis not present

## 2017-12-16 DIAGNOSIS — K769 Liver disease, unspecified: Secondary | ICD-10-CM | POA: Diagnosis not present

## 2017-12-16 DIAGNOSIS — N186 End stage renal disease: Secondary | ICD-10-CM | POA: Diagnosis not present

## 2017-12-16 DIAGNOSIS — D509 Iron deficiency anemia, unspecified: Secondary | ICD-10-CM | POA: Diagnosis not present

## 2017-12-16 DIAGNOSIS — Z79899 Other long term (current) drug therapy: Secondary | ICD-10-CM | POA: Diagnosis not present

## 2017-12-17 DIAGNOSIS — N186 End stage renal disease: Secondary | ICD-10-CM | POA: Diagnosis not present

## 2017-12-17 DIAGNOSIS — N2581 Secondary hyperparathyroidism of renal origin: Secondary | ICD-10-CM | POA: Diagnosis not present

## 2017-12-17 DIAGNOSIS — Z79899 Other long term (current) drug therapy: Secondary | ICD-10-CM | POA: Diagnosis not present

## 2017-12-17 DIAGNOSIS — D631 Anemia in chronic kidney disease: Secondary | ICD-10-CM | POA: Diagnosis not present

## 2017-12-17 DIAGNOSIS — K769 Liver disease, unspecified: Secondary | ICD-10-CM | POA: Diagnosis not present

## 2017-12-17 DIAGNOSIS — D509 Iron deficiency anemia, unspecified: Secondary | ICD-10-CM | POA: Diagnosis not present

## 2017-12-18 DIAGNOSIS — D509 Iron deficiency anemia, unspecified: Secondary | ICD-10-CM | POA: Diagnosis not present

## 2017-12-18 DIAGNOSIS — N2581 Secondary hyperparathyroidism of renal origin: Secondary | ICD-10-CM | POA: Diagnosis not present

## 2017-12-18 DIAGNOSIS — K769 Liver disease, unspecified: Secondary | ICD-10-CM | POA: Diagnosis not present

## 2017-12-18 DIAGNOSIS — Z79899 Other long term (current) drug therapy: Secondary | ICD-10-CM | POA: Diagnosis not present

## 2017-12-18 DIAGNOSIS — D631 Anemia in chronic kidney disease: Secondary | ICD-10-CM | POA: Diagnosis not present

## 2017-12-18 DIAGNOSIS — N186 End stage renal disease: Secondary | ICD-10-CM | POA: Diagnosis not present

## 2017-12-19 DIAGNOSIS — D509 Iron deficiency anemia, unspecified: Secondary | ICD-10-CM | POA: Diagnosis not present

## 2017-12-19 DIAGNOSIS — K769 Liver disease, unspecified: Secondary | ICD-10-CM | POA: Diagnosis not present

## 2017-12-19 DIAGNOSIS — D631 Anemia in chronic kidney disease: Secondary | ICD-10-CM | POA: Diagnosis not present

## 2017-12-19 DIAGNOSIS — Z79899 Other long term (current) drug therapy: Secondary | ICD-10-CM | POA: Diagnosis not present

## 2017-12-19 DIAGNOSIS — N2581 Secondary hyperparathyroidism of renal origin: Secondary | ICD-10-CM | POA: Diagnosis not present

## 2017-12-19 DIAGNOSIS — N186 End stage renal disease: Secondary | ICD-10-CM | POA: Diagnosis not present

## 2017-12-20 DIAGNOSIS — D631 Anemia in chronic kidney disease: Secondary | ICD-10-CM | POA: Diagnosis not present

## 2017-12-20 DIAGNOSIS — D509 Iron deficiency anemia, unspecified: Secondary | ICD-10-CM | POA: Diagnosis not present

## 2017-12-20 DIAGNOSIS — N186 End stage renal disease: Secondary | ICD-10-CM | POA: Diagnosis not present

## 2017-12-20 DIAGNOSIS — N2581 Secondary hyperparathyroidism of renal origin: Secondary | ICD-10-CM | POA: Diagnosis not present

## 2017-12-20 DIAGNOSIS — K769 Liver disease, unspecified: Secondary | ICD-10-CM | POA: Diagnosis not present

## 2017-12-20 DIAGNOSIS — Z79899 Other long term (current) drug therapy: Secondary | ICD-10-CM | POA: Diagnosis not present

## 2017-12-21 DIAGNOSIS — D631 Anemia in chronic kidney disease: Secondary | ICD-10-CM | POA: Diagnosis not present

## 2017-12-21 DIAGNOSIS — N186 End stage renal disease: Secondary | ICD-10-CM | POA: Diagnosis not present

## 2017-12-21 DIAGNOSIS — K769 Liver disease, unspecified: Secondary | ICD-10-CM | POA: Diagnosis not present

## 2017-12-21 DIAGNOSIS — N2581 Secondary hyperparathyroidism of renal origin: Secondary | ICD-10-CM | POA: Diagnosis not present

## 2017-12-21 DIAGNOSIS — Z79899 Other long term (current) drug therapy: Secondary | ICD-10-CM | POA: Diagnosis not present

## 2017-12-21 DIAGNOSIS — D509 Iron deficiency anemia, unspecified: Secondary | ICD-10-CM | POA: Diagnosis not present

## 2017-12-21 NOTE — Procedures (Signed)
PATIENT'S NAME:  Kimberly, [name] DOB:      March 09, 1956      MR#:    [id number]     DATE OF RECORDING: 12/11/2017 REFERRING M.D.:  Sarina Ill, MD  PCP : [name], M.D. Study Performed:   Baseline Polysomnogram HISTORY:  Kimberly [name] is a 62 y.o. female patient of Dr. Sarina Ill who followed the patient after a stroke in December 2018. The patient reportedly snores, and is forced to sleep on her right side or supine, due to dialysis catheter placement.  Diagnoses listed are : CVA, anemia, asthma, diabetes on peritoneal dialysis, diverticulosis, first-degree AV block, chronic cough, hypertension, hypothyroidism, IgA nephropathy, kidney transplant, renal transplant failure rejection, and she has had multiple strokes, the most recent was in 12/ 2018.   The patient endorsed the Epworth Sleepiness Scale at 12 points, FSS at 54 points, GDS 1/ 15 points. .   The patient's weight 146 pounds with a height of 65 (inches), resulting in a BMI of 24.2 kg/m2. The patient's neck circumference measured 14.5 inches.  CURRENT MEDICATIONS: Aspirin, Sensipar, Synthroid, Reglan, Klor-con, Spiriva   PROCEDURE:  This is a multichannel digital polysomnogram utilizing the SomnoStar 11.2 system.  Electrodes and sensors were applied and monitored per AASM Specifications.   EEG, EOG, Chin and Limb EMG, were sampled at 200 Hz.  ECG, Snore and Nasal Pressure, Thermal Airflow, Respiratory Effort, CPAP Flow and Pressure, Oximetry was sampled at 50 Hz. Digital video and audio were recorded.      BASELINE STUDY: Lights Out was at 22:37 and Lights On at 05:01.  Total recording time (TRT) was 384 minutes, with a total sleep time (TST) of 287 minutes.   The patient's sleep latency was 27.5 minutes.  REM latency was 161 minutes.  The sleep efficiency was 74.7 %.     SLEEP ARCHITECTURE: WASO (Wake after sleep onset) was 85 minutes.  There were 73 minutes in Stage N1, 154 minutes Stage N2, 39.5 minutes Stage N3 and 20.5  minutes in Stage REM.  The percentage of Stage N1 was 25.4%, Stage N2 was 53.7%, Stage N3 was 13.8% and Stage R (REM sleep) was 7.1%.   RESPIRATORY ANALYSIS:  There were a total of 104 respiratory events:  24 obstructive apneas, 0 central apneas and 80 hypopneas with 26 additional respiratory event related arousals (RERAs).     The total APNEA/HYPOPNEA INDEX (AHI) was 21.7/hour and the total RESPIRATORY DISTURBANCE INDEX was 27.2 /hour.  3 events occurred in REM sleep and 154 events in NREM. The REM AHI was 8.8 /hour, versus a non-REM AHI of 22.7. The patient spent 126 minutes of total sleep time in the supine position and 161 minutes in non-supine. The supine AHI was 44.3 versus a non-supine AHI of 4.1.  OXYGEN SATURATION & C02:  The Wake baseline 02 saturation was 97%, with the lowest being 85%. Time spent below 89% saturation equaled 15 minutes.   PERIODIC LIMB MOVEMENTS:  The patient had a total of 0 Periodic Limb Movements.  The arousals were noted as: 64 were spontaneous, 0 were associated with PLMs, and 81 were associated with respiratory events. Audio and video analysis did not show any abnormal or unusual movements, behaviors, phonations or vocalizations. Significant Snoring was noted. No Nocturia (peritoneal dialysis patient). EKG was in keeping with normal sinus rhythm (NSR).  Post-study, the patient indicated that sleep was the same as usual.    IMPRESSION:  1. Obstructive Sleep Apnea (OSA) at AHI of 21.7,  exacerbated in supine sleep to AHI of 44.3/h. 2. Loud Snoring and some coughing, choking with RDI of 27.2. 3. No evidence of hypoxemia.   RECOMMENDATIONS:  1. The main recommendation is to avoid supine sleep, as this sleep position is associated with a high AHI (Apnea Hypopnea Index).  2. Moderate severe apnea overall, associated with UARS (Upper Airway Resistance syndrome) is best treated with positive airway pressure.  3. Hypoxemia is not associated in this case and the patient  has the option of treatment with a dental device as well- usually not as effective but easier tolerated. Condition is that the patient has natural teeth.   1. Further information regarding OSA may be obtained from [employer] (www.sleepfoundation.org) or American Sleep Apnea Association (www.sleepapnea.org). 2. A follow up appointment will be scheduled in the Sleep Clinic at [facility]. The referring provider will be notified of the results.      I certify that I have reviewed the entire raw data recording prior to the issuance of this report in accordance with the Standards of Accreditation of the American Academy of Sleep Medicine (AASM)    Larey Seat, MD   12-21-2017  Diplomat, American Board of Psychiatry and Neurology  Diplomat, American Board of [place] Director, [place] [facility] [employer]

## 2017-12-21 NOTE — Addendum Note (Signed)
Addended by: Larey Seat on: 12/21/2017 03:27 PM   Modules accepted: Orders

## 2017-12-22 ENCOUNTER — Encounter: Payer: Self-pay | Admitting: Gastroenterology

## 2017-12-22 ENCOUNTER — Telehealth: Payer: Self-pay

## 2017-12-22 DIAGNOSIS — K769 Liver disease, unspecified: Secondary | ICD-10-CM | POA: Diagnosis not present

## 2017-12-22 DIAGNOSIS — N907 Vulvar cyst: Secondary | ICD-10-CM | POA: Diagnosis not present

## 2017-12-22 DIAGNOSIS — D631 Anemia in chronic kidney disease: Secondary | ICD-10-CM | POA: Diagnosis not present

## 2017-12-22 DIAGNOSIS — N2581 Secondary hyperparathyroidism of renal origin: Secondary | ICD-10-CM | POA: Diagnosis not present

## 2017-12-22 DIAGNOSIS — N2589 Other disorders resulting from impaired renal tubular function: Secondary | ICD-10-CM | POA: Diagnosis not present

## 2017-12-22 DIAGNOSIS — L089 Local infection of the skin and subcutaneous tissue, unspecified: Secondary | ICD-10-CM | POA: Diagnosis not present

## 2017-12-22 DIAGNOSIS — T8612 Kidney transplant failure: Secondary | ICD-10-CM | POA: Diagnosis not present

## 2017-12-22 DIAGNOSIS — Z992 Dependence on renal dialysis: Secondary | ICD-10-CM | POA: Diagnosis not present

## 2017-12-22 DIAGNOSIS — R109 Unspecified abdominal pain: Secondary | ICD-10-CM | POA: Diagnosis not present

## 2017-12-22 DIAGNOSIS — N186 End stage renal disease: Secondary | ICD-10-CM | POA: Diagnosis not present

## 2017-12-22 DIAGNOSIS — D509 Iron deficiency anemia, unspecified: Secondary | ICD-10-CM | POA: Diagnosis not present

## 2017-12-22 NOTE — Telephone Encounter (Signed)
Called patient to schedule CPAP study.  Also explained results of baseline sleep study.  Pt understood results even though she felt like she didn't hardly sleep that night.  Pt is hesitant to schedule CPAP study again for in-lab due to nightly dialysis.  Pt is interested in home auto-PAP therapy.  Will look into seeing if insurance will approve auto CPAP due to nightly dialysis.

## 2017-12-23 ENCOUNTER — Other Ambulatory Visit: Payer: Self-pay | Admitting: Obstetrics and Gynecology

## 2017-12-23 ENCOUNTER — Other Ambulatory Visit: Payer: Self-pay | Admitting: Neurology

## 2017-12-23 DIAGNOSIS — D631 Anemia in chronic kidney disease: Secondary | ICD-10-CM | POA: Diagnosis not present

## 2017-12-23 DIAGNOSIS — N186 End stage renal disease: Secondary | ICD-10-CM | POA: Diagnosis not present

## 2017-12-23 DIAGNOSIS — L089 Local infection of the skin and subcutaneous tissue, unspecified: Secondary | ICD-10-CM | POA: Diagnosis not present

## 2017-12-23 DIAGNOSIS — G4733 Obstructive sleep apnea (adult) (pediatric): Secondary | ICD-10-CM

## 2017-12-23 DIAGNOSIS — N2581 Secondary hyperparathyroidism of renal origin: Secondary | ICD-10-CM | POA: Diagnosis not present

## 2017-12-23 DIAGNOSIS — R109 Unspecified abdominal pain: Secondary | ICD-10-CM | POA: Diagnosis not present

## 2017-12-23 DIAGNOSIS — N762 Acute vulvitis: Secondary | ICD-10-CM | POA: Diagnosis not present

## 2017-12-23 DIAGNOSIS — N2589 Other disorders resulting from impaired renal tubular function: Secondary | ICD-10-CM | POA: Diagnosis not present

## 2017-12-23 DIAGNOSIS — N907 Vulvar cyst: Secondary | ICD-10-CM | POA: Diagnosis not present

## 2017-12-23 NOTE — Telephone Encounter (Signed)
I called pt. Dr. Brett Fairy recommends that pt can start a auto CPAP. I reviewed PAP compliance expectations with the pt. Pt is agreeable to starting a CPAP. I advised pt that an order will be sent to a DME, [facility], and [facility] will call the pt within about one week after they file with the pt's insurance. [facility] will show the pt how to use the machine, fit for masks, and troubleshoot the CPAP if needed. A follow up appt was made for insurance purposes with Cecille Rubin, NP on March 03, 2018 at 9:45 am. Pt verbalized understanding to arrive 15 minutes early and bring their CPAP. A letter with all of this information in it will be mailed to the pt as a reminder. I verified with the pt that the address we have on file is correct. Pt verbalized understanding of results. Pt had no questions at this time but was encouraged to call back if questions arise.

## 2017-12-23 NOTE — Telephone Encounter (Signed)
Dr Dohmeier agrees that auto cpap would be ok for the pt. I will call the pt and offer her auto CPAP 5-12 cm of water.

## 2017-12-24 DIAGNOSIS — N186 End stage renal disease: Secondary | ICD-10-CM | POA: Diagnosis not present

## 2017-12-24 DIAGNOSIS — L089 Local infection of the skin and subcutaneous tissue, unspecified: Secondary | ICD-10-CM | POA: Diagnosis not present

## 2017-12-24 DIAGNOSIS — D631 Anemia in chronic kidney disease: Secondary | ICD-10-CM | POA: Diagnosis not present

## 2017-12-24 DIAGNOSIS — N2589 Other disorders resulting from impaired renal tubular function: Secondary | ICD-10-CM | POA: Diagnosis not present

## 2017-12-24 DIAGNOSIS — R109 Unspecified abdominal pain: Secondary | ICD-10-CM | POA: Diagnosis not present

## 2017-12-24 DIAGNOSIS — N2581 Secondary hyperparathyroidism of renal origin: Secondary | ICD-10-CM | POA: Diagnosis not present

## 2017-12-25 DIAGNOSIS — N186 End stage renal disease: Secondary | ICD-10-CM | POA: Diagnosis not present

## 2017-12-25 DIAGNOSIS — N2589 Other disorders resulting from impaired renal tubular function: Secondary | ICD-10-CM | POA: Diagnosis not present

## 2017-12-25 DIAGNOSIS — N2581 Secondary hyperparathyroidism of renal origin: Secondary | ICD-10-CM | POA: Diagnosis not present

## 2017-12-25 DIAGNOSIS — L089 Local infection of the skin and subcutaneous tissue, unspecified: Secondary | ICD-10-CM | POA: Diagnosis not present

## 2017-12-25 DIAGNOSIS — R109 Unspecified abdominal pain: Secondary | ICD-10-CM | POA: Diagnosis not present

## 2017-12-25 DIAGNOSIS — D631 Anemia in chronic kidney disease: Secondary | ICD-10-CM | POA: Diagnosis not present

## 2017-12-26 DIAGNOSIS — N2589 Other disorders resulting from impaired renal tubular function: Secondary | ICD-10-CM | POA: Diagnosis not present

## 2017-12-26 DIAGNOSIS — R82998 Other abnormal findings in urine: Secondary | ICD-10-CM | POA: Diagnosis not present

## 2017-12-26 DIAGNOSIS — R109 Unspecified abdominal pain: Secondary | ICD-10-CM | POA: Diagnosis not present

## 2017-12-26 DIAGNOSIS — E7849 Other hyperlipidemia: Secondary | ICD-10-CM | POA: Diagnosis not present

## 2017-12-26 DIAGNOSIS — L089 Local infection of the skin and subcutaneous tissue, unspecified: Secondary | ICD-10-CM | POA: Diagnosis not present

## 2017-12-26 DIAGNOSIS — N186 End stage renal disease: Secondary | ICD-10-CM | POA: Diagnosis not present

## 2017-12-26 DIAGNOSIS — D631 Anemia in chronic kidney disease: Secondary | ICD-10-CM | POA: Diagnosis not present

## 2017-12-26 DIAGNOSIS — E119 Type 2 diabetes mellitus without complications: Secondary | ICD-10-CM | POA: Diagnosis not present

## 2017-12-26 DIAGNOSIS — N2581 Secondary hyperparathyroidism of renal origin: Secondary | ICD-10-CM | POA: Diagnosis not present

## 2017-12-26 DIAGNOSIS — E785 Hyperlipidemia, unspecified: Secondary | ICD-10-CM | POA: Diagnosis not present

## 2017-12-27 DIAGNOSIS — R109 Unspecified abdominal pain: Secondary | ICD-10-CM | POA: Diagnosis not present

## 2017-12-27 DIAGNOSIS — N2589 Other disorders resulting from impaired renal tubular function: Secondary | ICD-10-CM | POA: Diagnosis not present

## 2017-12-27 DIAGNOSIS — L089 Local infection of the skin and subcutaneous tissue, unspecified: Secondary | ICD-10-CM | POA: Diagnosis not present

## 2017-12-27 DIAGNOSIS — N186 End stage renal disease: Secondary | ICD-10-CM | POA: Diagnosis not present

## 2017-12-27 DIAGNOSIS — D631 Anemia in chronic kidney disease: Secondary | ICD-10-CM | POA: Diagnosis not present

## 2017-12-27 DIAGNOSIS — N2581 Secondary hyperparathyroidism of renal origin: Secondary | ICD-10-CM | POA: Diagnosis not present

## 2017-12-28 DIAGNOSIS — R109 Unspecified abdominal pain: Secondary | ICD-10-CM | POA: Diagnosis not present

## 2017-12-28 DIAGNOSIS — D631 Anemia in chronic kidney disease: Secondary | ICD-10-CM | POA: Diagnosis not present

## 2017-12-28 DIAGNOSIS — L089 Local infection of the skin and subcutaneous tissue, unspecified: Secondary | ICD-10-CM | POA: Diagnosis not present

## 2017-12-28 DIAGNOSIS — N2589 Other disorders resulting from impaired renal tubular function: Secondary | ICD-10-CM | POA: Diagnosis not present

## 2017-12-28 DIAGNOSIS — N186 End stage renal disease: Secondary | ICD-10-CM | POA: Diagnosis not present

## 2017-12-28 DIAGNOSIS — N2581 Secondary hyperparathyroidism of renal origin: Secondary | ICD-10-CM | POA: Diagnosis not present

## 2017-12-29 DIAGNOSIS — R109 Unspecified abdominal pain: Secondary | ICD-10-CM | POA: Diagnosis not present

## 2017-12-29 DIAGNOSIS — N186 End stage renal disease: Secondary | ICD-10-CM | POA: Diagnosis not present

## 2017-12-29 DIAGNOSIS — N2589 Other disorders resulting from impaired renal tubular function: Secondary | ICD-10-CM | POA: Diagnosis not present

## 2017-12-29 DIAGNOSIS — L089 Local infection of the skin and subcutaneous tissue, unspecified: Secondary | ICD-10-CM | POA: Diagnosis not present

## 2017-12-29 DIAGNOSIS — N2581 Secondary hyperparathyroidism of renal origin: Secondary | ICD-10-CM | POA: Diagnosis not present

## 2017-12-29 DIAGNOSIS — D631 Anemia in chronic kidney disease: Secondary | ICD-10-CM | POA: Diagnosis not present

## 2017-12-30 DIAGNOSIS — N186 End stage renal disease: Secondary | ICD-10-CM | POA: Diagnosis not present

## 2017-12-30 DIAGNOSIS — R109 Unspecified abdominal pain: Secondary | ICD-10-CM | POA: Diagnosis not present

## 2017-12-30 DIAGNOSIS — N2581 Secondary hyperparathyroidism of renal origin: Secondary | ICD-10-CM | POA: Diagnosis not present

## 2017-12-30 DIAGNOSIS — L089 Local infection of the skin and subcutaneous tissue, unspecified: Secondary | ICD-10-CM | POA: Diagnosis not present

## 2017-12-30 DIAGNOSIS — D631 Anemia in chronic kidney disease: Secondary | ICD-10-CM | POA: Diagnosis not present

## 2017-12-30 DIAGNOSIS — N2589 Other disorders resulting from impaired renal tubular function: Secondary | ICD-10-CM | POA: Diagnosis not present

## 2017-12-31 DIAGNOSIS — D631 Anemia in chronic kidney disease: Secondary | ICD-10-CM | POA: Diagnosis not present

## 2017-12-31 DIAGNOSIS — N2589 Other disorders resulting from impaired renal tubular function: Secondary | ICD-10-CM | POA: Diagnosis not present

## 2017-12-31 DIAGNOSIS — N2581 Secondary hyperparathyroidism of renal origin: Secondary | ICD-10-CM | POA: Diagnosis not present

## 2017-12-31 DIAGNOSIS — L089 Local infection of the skin and subcutaneous tissue, unspecified: Secondary | ICD-10-CM | POA: Diagnosis not present

## 2017-12-31 DIAGNOSIS — R109 Unspecified abdominal pain: Secondary | ICD-10-CM | POA: Diagnosis not present

## 2017-12-31 DIAGNOSIS — N186 End stage renal disease: Secondary | ICD-10-CM | POA: Diagnosis not present

## 2018-01-01 DIAGNOSIS — N186 End stage renal disease: Secondary | ICD-10-CM | POA: Diagnosis not present

## 2018-01-01 DIAGNOSIS — L089 Local infection of the skin and subcutaneous tissue, unspecified: Secondary | ICD-10-CM | POA: Diagnosis not present

## 2018-01-01 DIAGNOSIS — R109 Unspecified abdominal pain: Secondary | ICD-10-CM | POA: Diagnosis not present

## 2018-01-01 DIAGNOSIS — N2589 Other disorders resulting from impaired renal tubular function: Secondary | ICD-10-CM | POA: Diagnosis not present

## 2018-01-01 DIAGNOSIS — D631 Anemia in chronic kidney disease: Secondary | ICD-10-CM | POA: Diagnosis not present

## 2018-01-01 DIAGNOSIS — N2581 Secondary hyperparathyroidism of renal origin: Secondary | ICD-10-CM | POA: Diagnosis not present

## 2018-01-02 DIAGNOSIS — R109 Unspecified abdominal pain: Secondary | ICD-10-CM | POA: Diagnosis not present

## 2018-01-02 DIAGNOSIS — N2581 Secondary hyperparathyroidism of renal origin: Secondary | ICD-10-CM | POA: Diagnosis not present

## 2018-01-02 DIAGNOSIS — N2589 Other disorders resulting from impaired renal tubular function: Secondary | ICD-10-CM | POA: Diagnosis not present

## 2018-01-02 DIAGNOSIS — N186 End stage renal disease: Secondary | ICD-10-CM | POA: Diagnosis not present

## 2018-01-02 DIAGNOSIS — L089 Local infection of the skin and subcutaneous tissue, unspecified: Secondary | ICD-10-CM | POA: Diagnosis not present

## 2018-01-02 DIAGNOSIS — D631 Anemia in chronic kidney disease: Secondary | ICD-10-CM | POA: Diagnosis not present

## 2018-01-03 DIAGNOSIS — N2581 Secondary hyperparathyroidism of renal origin: Secondary | ICD-10-CM | POA: Diagnosis not present

## 2018-01-03 DIAGNOSIS — N2589 Other disorders resulting from impaired renal tubular function: Secondary | ICD-10-CM | POA: Diagnosis not present

## 2018-01-03 DIAGNOSIS — N186 End stage renal disease: Secondary | ICD-10-CM | POA: Diagnosis not present

## 2018-01-03 DIAGNOSIS — D631 Anemia in chronic kidney disease: Secondary | ICD-10-CM | POA: Diagnosis not present

## 2018-01-03 DIAGNOSIS — L089 Local infection of the skin and subcutaneous tissue, unspecified: Secondary | ICD-10-CM | POA: Diagnosis not present

## 2018-01-03 DIAGNOSIS — R109 Unspecified abdominal pain: Secondary | ICD-10-CM | POA: Diagnosis not present

## 2018-01-04 DIAGNOSIS — L089 Local infection of the skin and subcutaneous tissue, unspecified: Secondary | ICD-10-CM | POA: Diagnosis not present

## 2018-01-04 DIAGNOSIS — N186 End stage renal disease: Secondary | ICD-10-CM | POA: Diagnosis not present

## 2018-01-04 DIAGNOSIS — D631 Anemia in chronic kidney disease: Secondary | ICD-10-CM | POA: Diagnosis not present

## 2018-01-04 DIAGNOSIS — R109 Unspecified abdominal pain: Secondary | ICD-10-CM | POA: Diagnosis not present

## 2018-01-04 DIAGNOSIS — N2589 Other disorders resulting from impaired renal tubular function: Secondary | ICD-10-CM | POA: Diagnosis not present

## 2018-01-04 DIAGNOSIS — N2581 Secondary hyperparathyroidism of renal origin: Secondary | ICD-10-CM | POA: Diagnosis not present

## 2018-01-05 DIAGNOSIS — L089 Local infection of the skin and subcutaneous tissue, unspecified: Secondary | ICD-10-CM | POA: Diagnosis not present

## 2018-01-05 DIAGNOSIS — N2589 Other disorders resulting from impaired renal tubular function: Secondary | ICD-10-CM | POA: Diagnosis not present

## 2018-01-05 DIAGNOSIS — N2581 Secondary hyperparathyroidism of renal origin: Secondary | ICD-10-CM | POA: Diagnosis not present

## 2018-01-05 DIAGNOSIS — N186 End stage renal disease: Secondary | ICD-10-CM | POA: Diagnosis not present

## 2018-01-05 DIAGNOSIS — R109 Unspecified abdominal pain: Secondary | ICD-10-CM | POA: Diagnosis not present

## 2018-01-05 DIAGNOSIS — D631 Anemia in chronic kidney disease: Secondary | ICD-10-CM | POA: Diagnosis not present

## 2018-01-06 DIAGNOSIS — N2589 Other disorders resulting from impaired renal tubular function: Secondary | ICD-10-CM | POA: Diagnosis not present

## 2018-01-06 DIAGNOSIS — N186 End stage renal disease: Secondary | ICD-10-CM | POA: Diagnosis not present

## 2018-01-06 DIAGNOSIS — R109 Unspecified abdominal pain: Secondary | ICD-10-CM | POA: Diagnosis not present

## 2018-01-06 DIAGNOSIS — D631 Anemia in chronic kidney disease: Secondary | ICD-10-CM | POA: Diagnosis not present

## 2018-01-06 DIAGNOSIS — N2581 Secondary hyperparathyroidism of renal origin: Secondary | ICD-10-CM | POA: Diagnosis not present

## 2018-01-06 DIAGNOSIS — L720 Epidermal cyst: Secondary | ICD-10-CM | POA: Diagnosis not present

## 2018-01-06 DIAGNOSIS — L089 Local infection of the skin and subcutaneous tissue, unspecified: Secondary | ICD-10-CM | POA: Diagnosis not present

## 2018-01-07 DIAGNOSIS — L089 Local infection of the skin and subcutaneous tissue, unspecified: Secondary | ICD-10-CM | POA: Diagnosis not present

## 2018-01-07 DIAGNOSIS — D631 Anemia in chronic kidney disease: Secondary | ICD-10-CM | POA: Diagnosis not present

## 2018-01-07 DIAGNOSIS — N2589 Other disorders resulting from impaired renal tubular function: Secondary | ICD-10-CM | POA: Diagnosis not present

## 2018-01-07 DIAGNOSIS — N2581 Secondary hyperparathyroidism of renal origin: Secondary | ICD-10-CM | POA: Diagnosis not present

## 2018-01-07 DIAGNOSIS — R109 Unspecified abdominal pain: Secondary | ICD-10-CM | POA: Diagnosis not present

## 2018-01-07 DIAGNOSIS — N186 End stage renal disease: Secondary | ICD-10-CM | POA: Diagnosis not present

## 2018-01-08 DIAGNOSIS — L089 Local infection of the skin and subcutaneous tissue, unspecified: Secondary | ICD-10-CM | POA: Diagnosis not present

## 2018-01-08 DIAGNOSIS — N2581 Secondary hyperparathyroidism of renal origin: Secondary | ICD-10-CM | POA: Diagnosis not present

## 2018-01-08 DIAGNOSIS — R109 Unspecified abdominal pain: Secondary | ICD-10-CM | POA: Diagnosis not present

## 2018-01-08 DIAGNOSIS — N2589 Other disorders resulting from impaired renal tubular function: Secondary | ICD-10-CM | POA: Diagnosis not present

## 2018-01-08 DIAGNOSIS — N186 End stage renal disease: Secondary | ICD-10-CM | POA: Diagnosis not present

## 2018-01-08 DIAGNOSIS — D631 Anemia in chronic kidney disease: Secondary | ICD-10-CM | POA: Diagnosis not present

## 2018-01-09 DIAGNOSIS — N2581 Secondary hyperparathyroidism of renal origin: Secondary | ICD-10-CM | POA: Diagnosis not present

## 2018-01-09 DIAGNOSIS — D631 Anemia in chronic kidney disease: Secondary | ICD-10-CM | POA: Diagnosis not present

## 2018-01-09 DIAGNOSIS — R109 Unspecified abdominal pain: Secondary | ICD-10-CM | POA: Diagnosis not present

## 2018-01-09 DIAGNOSIS — N186 End stage renal disease: Secondary | ICD-10-CM | POA: Diagnosis not present

## 2018-01-09 DIAGNOSIS — N2589 Other disorders resulting from impaired renal tubular function: Secondary | ICD-10-CM | POA: Diagnosis not present

## 2018-01-09 DIAGNOSIS — L089 Local infection of the skin and subcutaneous tissue, unspecified: Secondary | ICD-10-CM | POA: Diagnosis not present

## 2018-01-10 DIAGNOSIS — R109 Unspecified abdominal pain: Secondary | ICD-10-CM | POA: Diagnosis not present

## 2018-01-10 DIAGNOSIS — D631 Anemia in chronic kidney disease: Secondary | ICD-10-CM | POA: Diagnosis not present

## 2018-01-10 DIAGNOSIS — N2589 Other disorders resulting from impaired renal tubular function: Secondary | ICD-10-CM | POA: Diagnosis not present

## 2018-01-10 DIAGNOSIS — N2581 Secondary hyperparathyroidism of renal origin: Secondary | ICD-10-CM | POA: Diagnosis not present

## 2018-01-10 DIAGNOSIS — L089 Local infection of the skin and subcutaneous tissue, unspecified: Secondary | ICD-10-CM | POA: Diagnosis not present

## 2018-01-10 DIAGNOSIS — N186 End stage renal disease: Secondary | ICD-10-CM | POA: Diagnosis not present

## 2018-01-11 DIAGNOSIS — N186 End stage renal disease: Secondary | ICD-10-CM | POA: Diagnosis not present

## 2018-01-11 DIAGNOSIS — N2589 Other disorders resulting from impaired renal tubular function: Secondary | ICD-10-CM | POA: Diagnosis not present

## 2018-01-11 DIAGNOSIS — N2581 Secondary hyperparathyroidism of renal origin: Secondary | ICD-10-CM | POA: Diagnosis not present

## 2018-01-11 DIAGNOSIS — R109 Unspecified abdominal pain: Secondary | ICD-10-CM | POA: Diagnosis not present

## 2018-01-11 DIAGNOSIS — L089 Local infection of the skin and subcutaneous tissue, unspecified: Secondary | ICD-10-CM | POA: Diagnosis not present

## 2018-01-11 DIAGNOSIS — D631 Anemia in chronic kidney disease: Secondary | ICD-10-CM | POA: Diagnosis not present

## 2018-01-12 DIAGNOSIS — N186 End stage renal disease: Secondary | ICD-10-CM | POA: Diagnosis not present

## 2018-01-12 DIAGNOSIS — L089 Local infection of the skin and subcutaneous tissue, unspecified: Secondary | ICD-10-CM | POA: Diagnosis not present

## 2018-01-12 DIAGNOSIS — N2589 Other disorders resulting from impaired renal tubular function: Secondary | ICD-10-CM | POA: Diagnosis not present

## 2018-01-12 DIAGNOSIS — N2581 Secondary hyperparathyroidism of renal origin: Secondary | ICD-10-CM | POA: Diagnosis not present

## 2018-01-12 DIAGNOSIS — R109 Unspecified abdominal pain: Secondary | ICD-10-CM | POA: Diagnosis not present

## 2018-01-12 DIAGNOSIS — D631 Anemia in chronic kidney disease: Secondary | ICD-10-CM | POA: Diagnosis not present

## 2018-01-13 DIAGNOSIS — D631 Anemia in chronic kidney disease: Secondary | ICD-10-CM | POA: Diagnosis not present

## 2018-01-13 DIAGNOSIS — N186 End stage renal disease: Secondary | ICD-10-CM | POA: Diagnosis not present

## 2018-01-13 DIAGNOSIS — N2581 Secondary hyperparathyroidism of renal origin: Secondary | ICD-10-CM | POA: Diagnosis not present

## 2018-01-13 DIAGNOSIS — L928 Other granulomatous disorders of the skin and subcutaneous tissue: Secondary | ICD-10-CM | POA: Diagnosis not present

## 2018-01-13 DIAGNOSIS — N2589 Other disorders resulting from impaired renal tubular function: Secondary | ICD-10-CM | POA: Diagnosis not present

## 2018-01-13 DIAGNOSIS — L089 Local infection of the skin and subcutaneous tissue, unspecified: Secondary | ICD-10-CM | POA: Diagnosis not present

## 2018-01-13 DIAGNOSIS — R109 Unspecified abdominal pain: Secondary | ICD-10-CM | POA: Diagnosis not present

## 2018-01-14 DIAGNOSIS — N2581 Secondary hyperparathyroidism of renal origin: Secondary | ICD-10-CM | POA: Diagnosis not present

## 2018-01-14 DIAGNOSIS — L089 Local infection of the skin and subcutaneous tissue, unspecified: Secondary | ICD-10-CM | POA: Diagnosis not present

## 2018-01-14 DIAGNOSIS — R109 Unspecified abdominal pain: Secondary | ICD-10-CM | POA: Diagnosis not present

## 2018-01-14 DIAGNOSIS — N2589 Other disorders resulting from impaired renal tubular function: Secondary | ICD-10-CM | POA: Diagnosis not present

## 2018-01-14 DIAGNOSIS — N186 End stage renal disease: Secondary | ICD-10-CM | POA: Diagnosis not present

## 2018-01-14 DIAGNOSIS — D631 Anemia in chronic kidney disease: Secondary | ICD-10-CM | POA: Diagnosis not present

## 2018-01-15 DIAGNOSIS — R109 Unspecified abdominal pain: Secondary | ICD-10-CM | POA: Diagnosis not present

## 2018-01-15 DIAGNOSIS — N2581 Secondary hyperparathyroidism of renal origin: Secondary | ICD-10-CM | POA: Diagnosis not present

## 2018-01-15 DIAGNOSIS — N2589 Other disorders resulting from impaired renal tubular function: Secondary | ICD-10-CM | POA: Diagnosis not present

## 2018-01-15 DIAGNOSIS — L089 Local infection of the skin and subcutaneous tissue, unspecified: Secondary | ICD-10-CM | POA: Diagnosis not present

## 2018-01-15 DIAGNOSIS — N186 End stage renal disease: Secondary | ICD-10-CM | POA: Diagnosis not present

## 2018-01-15 DIAGNOSIS — D631 Anemia in chronic kidney disease: Secondary | ICD-10-CM | POA: Diagnosis not present

## 2018-01-16 DIAGNOSIS — L089 Local infection of the skin and subcutaneous tissue, unspecified: Secondary | ICD-10-CM | POA: Diagnosis not present

## 2018-01-16 DIAGNOSIS — N2581 Secondary hyperparathyroidism of renal origin: Secondary | ICD-10-CM | POA: Diagnosis not present

## 2018-01-16 DIAGNOSIS — N186 End stage renal disease: Secondary | ICD-10-CM | POA: Diagnosis not present

## 2018-01-16 DIAGNOSIS — D631 Anemia in chronic kidney disease: Secondary | ICD-10-CM | POA: Diagnosis not present

## 2018-01-16 DIAGNOSIS — N2589 Other disorders resulting from impaired renal tubular function: Secondary | ICD-10-CM | POA: Diagnosis not present

## 2018-01-16 DIAGNOSIS — R109 Unspecified abdominal pain: Secondary | ICD-10-CM | POA: Diagnosis not present

## 2018-01-17 DIAGNOSIS — L089 Local infection of the skin and subcutaneous tissue, unspecified: Secondary | ICD-10-CM | POA: Diagnosis not present

## 2018-01-17 DIAGNOSIS — R109 Unspecified abdominal pain: Secondary | ICD-10-CM | POA: Diagnosis not present

## 2018-01-17 DIAGNOSIS — N186 End stage renal disease: Secondary | ICD-10-CM | POA: Diagnosis not present

## 2018-01-17 DIAGNOSIS — D631 Anemia in chronic kidney disease: Secondary | ICD-10-CM | POA: Diagnosis not present

## 2018-01-17 DIAGNOSIS — N2589 Other disorders resulting from impaired renal tubular function: Secondary | ICD-10-CM | POA: Diagnosis not present

## 2018-01-17 DIAGNOSIS — N2581 Secondary hyperparathyroidism of renal origin: Secondary | ICD-10-CM | POA: Diagnosis not present

## 2018-01-18 DIAGNOSIS — L089 Local infection of the skin and subcutaneous tissue, unspecified: Secondary | ICD-10-CM | POA: Diagnosis not present

## 2018-01-18 DIAGNOSIS — D631 Anemia in chronic kidney disease: Secondary | ICD-10-CM | POA: Diagnosis not present

## 2018-01-18 DIAGNOSIS — R109 Unspecified abdominal pain: Secondary | ICD-10-CM | POA: Diagnosis not present

## 2018-01-18 DIAGNOSIS — N2589 Other disorders resulting from impaired renal tubular function: Secondary | ICD-10-CM | POA: Diagnosis not present

## 2018-01-18 DIAGNOSIS — N2581 Secondary hyperparathyroidism of renal origin: Secondary | ICD-10-CM | POA: Diagnosis not present

## 2018-01-18 DIAGNOSIS — N186 End stage renal disease: Secondary | ICD-10-CM | POA: Diagnosis not present

## 2018-01-19 DIAGNOSIS — N2589 Other disorders resulting from impaired renal tubular function: Secondary | ICD-10-CM | POA: Diagnosis not present

## 2018-01-19 DIAGNOSIS — D631 Anemia in chronic kidney disease: Secondary | ICD-10-CM | POA: Diagnosis not present

## 2018-01-19 DIAGNOSIS — L089 Local infection of the skin and subcutaneous tissue, unspecified: Secondary | ICD-10-CM | POA: Diagnosis not present

## 2018-01-19 DIAGNOSIS — N186 End stage renal disease: Secondary | ICD-10-CM | POA: Diagnosis not present

## 2018-01-19 DIAGNOSIS — R109 Unspecified abdominal pain: Secondary | ICD-10-CM | POA: Diagnosis not present

## 2018-01-19 DIAGNOSIS — N2581 Secondary hyperparathyroidism of renal origin: Secondary | ICD-10-CM | POA: Diagnosis not present

## 2018-01-20 DIAGNOSIS — D631 Anemia in chronic kidney disease: Secondary | ICD-10-CM | POA: Diagnosis not present

## 2018-01-20 DIAGNOSIS — N186 End stage renal disease: Secondary | ICD-10-CM | POA: Diagnosis not present

## 2018-01-20 DIAGNOSIS — N2581 Secondary hyperparathyroidism of renal origin: Secondary | ICD-10-CM | POA: Diagnosis not present

## 2018-01-20 DIAGNOSIS — N2589 Other disorders resulting from impaired renal tubular function: Secondary | ICD-10-CM | POA: Diagnosis not present

## 2018-01-20 DIAGNOSIS — R109 Unspecified abdominal pain: Secondary | ICD-10-CM | POA: Diagnosis not present

## 2018-01-20 DIAGNOSIS — L089 Local infection of the skin and subcutaneous tissue, unspecified: Secondary | ICD-10-CM | POA: Diagnosis not present

## 2018-01-21 ENCOUNTER — Telehealth: Payer: Self-pay | Admitting: Neurology

## 2018-01-21 DIAGNOSIS — R509 Fever, unspecified: Secondary | ICD-10-CM | POA: Diagnosis not present

## 2018-01-21 DIAGNOSIS — D631 Anemia in chronic kidney disease: Secondary | ICD-10-CM | POA: Diagnosis not present

## 2018-01-21 DIAGNOSIS — Z992 Dependence on renal dialysis: Secondary | ICD-10-CM | POA: Diagnosis not present

## 2018-01-21 DIAGNOSIS — T8612 Kidney transplant failure: Secondary | ICD-10-CM | POA: Diagnosis not present

## 2018-01-21 DIAGNOSIS — L089 Local infection of the skin and subcutaneous tissue, unspecified: Secondary | ICD-10-CM | POA: Diagnosis not present

## 2018-01-21 DIAGNOSIS — D509 Iron deficiency anemia, unspecified: Secondary | ICD-10-CM | POA: Diagnosis not present

## 2018-01-21 DIAGNOSIS — K769 Liver disease, unspecified: Secondary | ICD-10-CM | POA: Diagnosis not present

## 2018-01-21 DIAGNOSIS — N186 End stage renal disease: Secondary | ICD-10-CM | POA: Diagnosis not present

## 2018-01-21 DIAGNOSIS — N2581 Secondary hyperparathyroidism of renal origin: Secondary | ICD-10-CM | POA: Diagnosis not present

## 2018-01-21 DIAGNOSIS — E876 Hypokalemia: Secondary | ICD-10-CM | POA: Diagnosis not present

## 2018-01-21 DIAGNOSIS — N2589 Other disorders resulting from impaired renal tubular function: Secondary | ICD-10-CM | POA: Diagnosis not present

## 2018-01-21 DIAGNOSIS — E46 Unspecified protein-calorie malnutrition: Secondary | ICD-10-CM | POA: Diagnosis not present

## 2018-01-21 DIAGNOSIS — Z79899 Other long term (current) drug therapy: Secondary | ICD-10-CM | POA: Diagnosis not present

## 2018-01-21 DIAGNOSIS — K659 Peritonitis, unspecified: Secondary | ICD-10-CM | POA: Diagnosis not present

## 2018-01-21 NOTE — Telephone Encounter (Signed)
Pt states the company that she has been assigned to for her sleep machine has not had good reviews, patient would like to be switched over to [facility] (pt did not have a phone or fax# to provide for them)

## 2018-01-21 NOTE — Telephone Encounter (Signed)
Contacted pt to verify the switch and inform her that [facility] had attempted to reach out to the pt and left voicemails. The pt states that she had not heard from [facility]. I informed her that I would send her orders to [facility] for her but made sure she knew that I didn't have a contact person at [facility]. I informed her that with [facility] and the reason that we recommend them is because we have good open communication with them and usually able to have them respond within same day or next with [facility] I dont have that open communication to follow through.  Pt verbalized understanding and still wanted the switch. Order sent to [facility] after verifying they do accept her insurance. Pt was schedule 6/11 for an apt but she has to have at least 31 days of data before we can see her for insurance compliance. She already had another apt for 6/27 at this time I have put her compliance visit with that one as well and informed the pt that if she didt have her CPAP at least 31 days then we would have to push apt out. Pt verbalized understanding.

## 2018-01-22 DIAGNOSIS — K659 Peritonitis, unspecified: Secondary | ICD-10-CM | POA: Diagnosis not present

## 2018-01-22 DIAGNOSIS — N2581 Secondary hyperparathyroidism of renal origin: Secondary | ICD-10-CM | POA: Diagnosis not present

## 2018-01-22 DIAGNOSIS — E876 Hypokalemia: Secondary | ICD-10-CM | POA: Diagnosis not present

## 2018-01-22 DIAGNOSIS — R509 Fever, unspecified: Secondary | ICD-10-CM | POA: Diagnosis not present

## 2018-01-22 DIAGNOSIS — D631 Anemia in chronic kidney disease: Secondary | ICD-10-CM | POA: Diagnosis not present

## 2018-01-22 DIAGNOSIS — N186 End stage renal disease: Secondary | ICD-10-CM | POA: Diagnosis not present

## 2018-01-23 DIAGNOSIS — K659 Peritonitis, unspecified: Secondary | ICD-10-CM | POA: Diagnosis not present

## 2018-01-23 DIAGNOSIS — R509 Fever, unspecified: Secondary | ICD-10-CM | POA: Diagnosis not present

## 2018-01-23 DIAGNOSIS — D631 Anemia in chronic kidney disease: Secondary | ICD-10-CM | POA: Diagnosis not present

## 2018-01-23 DIAGNOSIS — N2581 Secondary hyperparathyroidism of renal origin: Secondary | ICD-10-CM | POA: Diagnosis not present

## 2018-01-23 DIAGNOSIS — E876 Hypokalemia: Secondary | ICD-10-CM | POA: Diagnosis not present

## 2018-01-23 DIAGNOSIS — N186 End stage renal disease: Secondary | ICD-10-CM | POA: Diagnosis not present

## 2018-01-24 DIAGNOSIS — N2581 Secondary hyperparathyroidism of renal origin: Secondary | ICD-10-CM | POA: Diagnosis not present

## 2018-01-24 DIAGNOSIS — E876 Hypokalemia: Secondary | ICD-10-CM | POA: Diagnosis not present

## 2018-01-24 DIAGNOSIS — N186 End stage renal disease: Secondary | ICD-10-CM | POA: Diagnosis not present

## 2018-01-24 DIAGNOSIS — D631 Anemia in chronic kidney disease: Secondary | ICD-10-CM | POA: Diagnosis not present

## 2018-01-24 DIAGNOSIS — R509 Fever, unspecified: Secondary | ICD-10-CM | POA: Diagnosis not present

## 2018-01-24 DIAGNOSIS — K659 Peritonitis, unspecified: Secondary | ICD-10-CM | POA: Diagnosis not present

## 2018-01-25 DIAGNOSIS — K659 Peritonitis, unspecified: Secondary | ICD-10-CM | POA: Diagnosis not present

## 2018-01-25 DIAGNOSIS — R509 Fever, unspecified: Secondary | ICD-10-CM | POA: Diagnosis not present

## 2018-01-25 DIAGNOSIS — E876 Hypokalemia: Secondary | ICD-10-CM | POA: Diagnosis not present

## 2018-01-25 DIAGNOSIS — N186 End stage renal disease: Secondary | ICD-10-CM | POA: Diagnosis not present

## 2018-01-25 DIAGNOSIS — N2581 Secondary hyperparathyroidism of renal origin: Secondary | ICD-10-CM | POA: Diagnosis not present

## 2018-01-25 DIAGNOSIS — D631 Anemia in chronic kidney disease: Secondary | ICD-10-CM | POA: Diagnosis not present

## 2018-01-26 DIAGNOSIS — K659 Peritonitis, unspecified: Secondary | ICD-10-CM | POA: Diagnosis not present

## 2018-01-26 DIAGNOSIS — R509 Fever, unspecified: Secondary | ICD-10-CM | POA: Diagnosis not present

## 2018-01-26 DIAGNOSIS — D631 Anemia in chronic kidney disease: Secondary | ICD-10-CM | POA: Diagnosis not present

## 2018-01-26 DIAGNOSIS — N186 End stage renal disease: Secondary | ICD-10-CM | POA: Diagnosis not present

## 2018-01-26 DIAGNOSIS — N2581 Secondary hyperparathyroidism of renal origin: Secondary | ICD-10-CM | POA: Diagnosis not present

## 2018-01-26 DIAGNOSIS — E876 Hypokalemia: Secondary | ICD-10-CM | POA: Diagnosis not present

## 2018-01-27 DIAGNOSIS — E876 Hypokalemia: Secondary | ICD-10-CM | POA: Diagnosis not present

## 2018-01-27 DIAGNOSIS — N186 End stage renal disease: Secondary | ICD-10-CM | POA: Diagnosis not present

## 2018-01-27 DIAGNOSIS — N2581 Secondary hyperparathyroidism of renal origin: Secondary | ICD-10-CM | POA: Diagnosis not present

## 2018-01-27 DIAGNOSIS — R509 Fever, unspecified: Secondary | ICD-10-CM | POA: Diagnosis not present

## 2018-01-27 DIAGNOSIS — D631 Anemia in chronic kidney disease: Secondary | ICD-10-CM | POA: Diagnosis not present

## 2018-01-27 DIAGNOSIS — K659 Peritonitis, unspecified: Secondary | ICD-10-CM | POA: Diagnosis not present

## 2018-01-28 DIAGNOSIS — D631 Anemia in chronic kidney disease: Secondary | ICD-10-CM | POA: Diagnosis not present

## 2018-01-28 DIAGNOSIS — N186 End stage renal disease: Secondary | ICD-10-CM | POA: Diagnosis not present

## 2018-01-28 DIAGNOSIS — E876 Hypokalemia: Secondary | ICD-10-CM | POA: Diagnosis not present

## 2018-01-28 DIAGNOSIS — K659 Peritonitis, unspecified: Secondary | ICD-10-CM | POA: Diagnosis not present

## 2018-01-28 DIAGNOSIS — N2581 Secondary hyperparathyroidism of renal origin: Secondary | ICD-10-CM | POA: Diagnosis not present

## 2018-01-28 DIAGNOSIS — R509 Fever, unspecified: Secondary | ICD-10-CM | POA: Diagnosis not present

## 2018-01-29 DIAGNOSIS — R509 Fever, unspecified: Secondary | ICD-10-CM | POA: Diagnosis not present

## 2018-01-29 DIAGNOSIS — D631 Anemia in chronic kidney disease: Secondary | ICD-10-CM | POA: Diagnosis not present

## 2018-01-29 DIAGNOSIS — K659 Peritonitis, unspecified: Secondary | ICD-10-CM | POA: Diagnosis not present

## 2018-01-29 DIAGNOSIS — N2581 Secondary hyperparathyroidism of renal origin: Secondary | ICD-10-CM | POA: Diagnosis not present

## 2018-01-29 DIAGNOSIS — R82998 Other abnormal findings in urine: Secondary | ICD-10-CM | POA: Diagnosis not present

## 2018-01-29 DIAGNOSIS — N186 End stage renal disease: Secondary | ICD-10-CM | POA: Diagnosis not present

## 2018-01-29 DIAGNOSIS — E876 Hypokalemia: Secondary | ICD-10-CM | POA: Diagnosis not present

## 2018-01-30 DIAGNOSIS — K659 Peritonitis, unspecified: Secondary | ICD-10-CM | POA: Diagnosis not present

## 2018-01-30 DIAGNOSIS — N186 End stage renal disease: Secondary | ICD-10-CM | POA: Diagnosis not present

## 2018-01-30 DIAGNOSIS — R509 Fever, unspecified: Secondary | ICD-10-CM | POA: Diagnosis not present

## 2018-01-30 DIAGNOSIS — E876 Hypokalemia: Secondary | ICD-10-CM | POA: Diagnosis not present

## 2018-01-30 DIAGNOSIS — N2581 Secondary hyperparathyroidism of renal origin: Secondary | ICD-10-CM | POA: Diagnosis not present

## 2018-01-30 DIAGNOSIS — D631 Anemia in chronic kidney disease: Secondary | ICD-10-CM | POA: Diagnosis not present

## 2018-01-31 DIAGNOSIS — K659 Peritonitis, unspecified: Secondary | ICD-10-CM | POA: Diagnosis not present

## 2018-01-31 DIAGNOSIS — R509 Fever, unspecified: Secondary | ICD-10-CM | POA: Diagnosis not present

## 2018-01-31 DIAGNOSIS — N186 End stage renal disease: Secondary | ICD-10-CM | POA: Diagnosis not present

## 2018-01-31 DIAGNOSIS — E876 Hypokalemia: Secondary | ICD-10-CM | POA: Diagnosis not present

## 2018-01-31 DIAGNOSIS — D631 Anemia in chronic kidney disease: Secondary | ICD-10-CM | POA: Diagnosis not present

## 2018-01-31 DIAGNOSIS — N2581 Secondary hyperparathyroidism of renal origin: Secondary | ICD-10-CM | POA: Diagnosis not present

## 2018-02-01 DIAGNOSIS — N2581 Secondary hyperparathyroidism of renal origin: Secondary | ICD-10-CM | POA: Diagnosis not present

## 2018-02-01 DIAGNOSIS — R509 Fever, unspecified: Secondary | ICD-10-CM | POA: Diagnosis not present

## 2018-02-01 DIAGNOSIS — N186 End stage renal disease: Secondary | ICD-10-CM | POA: Diagnosis not present

## 2018-02-01 DIAGNOSIS — D631 Anemia in chronic kidney disease: Secondary | ICD-10-CM | POA: Diagnosis not present

## 2018-02-01 DIAGNOSIS — K659 Peritonitis, unspecified: Secondary | ICD-10-CM | POA: Diagnosis not present

## 2018-02-01 DIAGNOSIS — E876 Hypokalemia: Secondary | ICD-10-CM | POA: Diagnosis not present

## 2018-02-02 DIAGNOSIS — R509 Fever, unspecified: Secondary | ICD-10-CM | POA: Diagnosis not present

## 2018-02-02 DIAGNOSIS — K659 Peritonitis, unspecified: Secondary | ICD-10-CM | POA: Diagnosis not present

## 2018-02-02 DIAGNOSIS — E876 Hypokalemia: Secondary | ICD-10-CM | POA: Diagnosis not present

## 2018-02-02 DIAGNOSIS — N2581 Secondary hyperparathyroidism of renal origin: Secondary | ICD-10-CM | POA: Diagnosis not present

## 2018-02-02 DIAGNOSIS — N186 End stage renal disease: Secondary | ICD-10-CM | POA: Diagnosis not present

## 2018-02-02 DIAGNOSIS — D631 Anemia in chronic kidney disease: Secondary | ICD-10-CM | POA: Diagnosis not present

## 2018-02-03 DIAGNOSIS — R509 Fever, unspecified: Secondary | ICD-10-CM | POA: Diagnosis not present

## 2018-02-03 DIAGNOSIS — D631 Anemia in chronic kidney disease: Secondary | ICD-10-CM | POA: Diagnosis not present

## 2018-02-03 DIAGNOSIS — N186 End stage renal disease: Secondary | ICD-10-CM | POA: Diagnosis not present

## 2018-02-03 DIAGNOSIS — E876 Hypokalemia: Secondary | ICD-10-CM | POA: Diagnosis not present

## 2018-02-03 DIAGNOSIS — K659 Peritonitis, unspecified: Secondary | ICD-10-CM | POA: Diagnosis not present

## 2018-02-03 DIAGNOSIS — N2581 Secondary hyperparathyroidism of renal origin: Secondary | ICD-10-CM | POA: Diagnosis not present

## 2018-02-04 DIAGNOSIS — N2581 Secondary hyperparathyroidism of renal origin: Secondary | ICD-10-CM | POA: Diagnosis not present

## 2018-02-04 DIAGNOSIS — R509 Fever, unspecified: Secondary | ICD-10-CM | POA: Diagnosis not present

## 2018-02-04 DIAGNOSIS — K659 Peritonitis, unspecified: Secondary | ICD-10-CM | POA: Diagnosis not present

## 2018-02-04 DIAGNOSIS — D631 Anemia in chronic kidney disease: Secondary | ICD-10-CM | POA: Diagnosis not present

## 2018-02-04 DIAGNOSIS — E876 Hypokalemia: Secondary | ICD-10-CM | POA: Diagnosis not present

## 2018-02-04 DIAGNOSIS — N186 End stage renal disease: Secondary | ICD-10-CM | POA: Diagnosis not present

## 2018-02-05 DIAGNOSIS — N186 End stage renal disease: Secondary | ICD-10-CM | POA: Diagnosis not present

## 2018-02-05 DIAGNOSIS — R509 Fever, unspecified: Secondary | ICD-10-CM | POA: Diagnosis not present

## 2018-02-05 DIAGNOSIS — D631 Anemia in chronic kidney disease: Secondary | ICD-10-CM | POA: Diagnosis not present

## 2018-02-05 DIAGNOSIS — K659 Peritonitis, unspecified: Secondary | ICD-10-CM | POA: Diagnosis not present

## 2018-02-05 DIAGNOSIS — N2581 Secondary hyperparathyroidism of renal origin: Secondary | ICD-10-CM | POA: Diagnosis not present

## 2018-02-05 DIAGNOSIS — E876 Hypokalemia: Secondary | ICD-10-CM | POA: Diagnosis not present

## 2018-02-06 DIAGNOSIS — R509 Fever, unspecified: Secondary | ICD-10-CM | POA: Diagnosis not present

## 2018-02-06 DIAGNOSIS — N186 End stage renal disease: Secondary | ICD-10-CM | POA: Diagnosis not present

## 2018-02-06 DIAGNOSIS — N2581 Secondary hyperparathyroidism of renal origin: Secondary | ICD-10-CM | POA: Diagnosis not present

## 2018-02-06 DIAGNOSIS — D631 Anemia in chronic kidney disease: Secondary | ICD-10-CM | POA: Diagnosis not present

## 2018-02-06 DIAGNOSIS — L089 Local infection of the skin and subcutaneous tissue, unspecified: Secondary | ICD-10-CM | POA: Diagnosis not present

## 2018-02-06 DIAGNOSIS — K659 Peritonitis, unspecified: Secondary | ICD-10-CM | POA: Diagnosis not present

## 2018-02-06 DIAGNOSIS — E876 Hypokalemia: Secondary | ICD-10-CM | POA: Diagnosis not present

## 2018-02-07 DIAGNOSIS — K659 Peritonitis, unspecified: Secondary | ICD-10-CM | POA: Diagnosis not present

## 2018-02-07 DIAGNOSIS — E876 Hypokalemia: Secondary | ICD-10-CM | POA: Diagnosis not present

## 2018-02-07 DIAGNOSIS — R509 Fever, unspecified: Secondary | ICD-10-CM | POA: Diagnosis not present

## 2018-02-07 DIAGNOSIS — N186 End stage renal disease: Secondary | ICD-10-CM | POA: Diagnosis not present

## 2018-02-07 DIAGNOSIS — D631 Anemia in chronic kidney disease: Secondary | ICD-10-CM | POA: Diagnosis not present

## 2018-02-07 DIAGNOSIS — N2581 Secondary hyperparathyroidism of renal origin: Secondary | ICD-10-CM | POA: Diagnosis not present

## 2018-02-08 DIAGNOSIS — D631 Anemia in chronic kidney disease: Secondary | ICD-10-CM | POA: Diagnosis not present

## 2018-02-08 DIAGNOSIS — N2581 Secondary hyperparathyroidism of renal origin: Secondary | ICD-10-CM | POA: Diagnosis not present

## 2018-02-08 DIAGNOSIS — E876 Hypokalemia: Secondary | ICD-10-CM | POA: Diagnosis not present

## 2018-02-08 DIAGNOSIS — N186 End stage renal disease: Secondary | ICD-10-CM | POA: Diagnosis not present

## 2018-02-08 DIAGNOSIS — R509 Fever, unspecified: Secondary | ICD-10-CM | POA: Diagnosis not present

## 2018-02-08 DIAGNOSIS — K659 Peritonitis, unspecified: Secondary | ICD-10-CM | POA: Diagnosis not present

## 2018-02-09 ENCOUNTER — Encounter (HOSPITAL_COMMUNITY): Payer: Self-pay

## 2018-02-09 ENCOUNTER — Other Ambulatory Visit: Payer: Self-pay

## 2018-02-09 DIAGNOSIS — Z79899 Other long term (current) drug therapy: Secondary | ICD-10-CM | POA: Insufficient documentation

## 2018-02-09 DIAGNOSIS — K659 Peritonitis, unspecified: Secondary | ICD-10-CM | POA: Diagnosis not present

## 2018-02-09 DIAGNOSIS — N2581 Secondary hyperparathyroidism of renal origin: Secondary | ICD-10-CM | POA: Diagnosis not present

## 2018-02-09 DIAGNOSIS — Z7982 Long term (current) use of aspirin: Secondary | ICD-10-CM | POA: Insufficient documentation

## 2018-02-09 DIAGNOSIS — Z940 Kidney transplant status: Secondary | ICD-10-CM | POA: Diagnosis not present

## 2018-02-09 DIAGNOSIS — D631 Anemia in chronic kidney disease: Secondary | ICD-10-CM | POA: Diagnosis not present

## 2018-02-09 DIAGNOSIS — J45909 Unspecified asthma, uncomplicated: Secondary | ICD-10-CM | POA: Insufficient documentation

## 2018-02-09 DIAGNOSIS — I120 Hypertensive chronic kidney disease with stage 5 chronic kidney disease or end stage renal disease: Secondary | ICD-10-CM | POA: Diagnosis not present

## 2018-02-09 DIAGNOSIS — Z8673 Personal history of transient ischemic attack (TIA), and cerebral infarction without residual deficits: Secondary | ICD-10-CM | POA: Diagnosis not present

## 2018-02-09 DIAGNOSIS — R5383 Other fatigue: Secondary | ICD-10-CM | POA: Diagnosis not present

## 2018-02-09 DIAGNOSIS — E1122 Type 2 diabetes mellitus with diabetic chronic kidney disease: Secondary | ICD-10-CM | POA: Diagnosis not present

## 2018-02-09 DIAGNOSIS — E876 Hypokalemia: Secondary | ICD-10-CM | POA: Diagnosis not present

## 2018-02-09 DIAGNOSIS — R509 Fever, unspecified: Secondary | ICD-10-CM | POA: Diagnosis present

## 2018-02-09 DIAGNOSIS — N186 End stage renal disease: Secondary | ICD-10-CM | POA: Diagnosis not present

## 2018-02-09 DIAGNOSIS — R05 Cough: Secondary | ICD-10-CM | POA: Diagnosis not present

## 2018-02-09 NOTE — ED Triage Notes (Signed)
Patient reports that she had a fever yesterday 100.8 and 100.7 today prior to triage. Patient's temp in triage 98.3 patient states she did not take any meds for fever. Patient has a cath for peritoneal dialysis. Patient states she got an infection and was placed on Fortaz for 19 days. Patient states dialysis nurse cultured the cath site again and was negative. Patient reports she feels fatigued. Patient also reports that she had N/V/D 3 days ago,but is no longer hving these symptoms,but is unable to eat.

## 2018-02-09 NOTE — ED Notes (Signed)
Unable to collect labs.  I did not get any blood return

## 2018-02-10 ENCOUNTER — Emergency Department (HOSPITAL_COMMUNITY): Payer: BLUE CROSS/BLUE SHIELD

## 2018-02-10 ENCOUNTER — Emergency Department (HOSPITAL_COMMUNITY)
Admission: EM | Admit: 2018-02-10 | Discharge: 2018-02-10 | Disposition: A | Discharge status: Home / Self Care | Payer: BLUE CROSS/BLUE SHIELD | Attending: Emergency Medicine | Admitting: Emergency Medicine

## 2018-02-10 DIAGNOSIS — N186 End stage renal disease: Secondary | ICD-10-CM | POA: Diagnosis not present

## 2018-02-10 DIAGNOSIS — E876 Hypokalemia: Secondary | ICD-10-CM | POA: Diagnosis not present

## 2018-02-10 DIAGNOSIS — K650 Generalized (acute) peritonitis: Secondary | ICD-10-CM | POA: Diagnosis not present

## 2018-02-10 DIAGNOSIS — R05 Cough: Secondary | ICD-10-CM | POA: Diagnosis not present

## 2018-02-10 DIAGNOSIS — R5383 Other fatigue: Secondary | ICD-10-CM

## 2018-02-10 DIAGNOSIS — D631 Anemia in chronic kidney disease: Secondary | ICD-10-CM | POA: Diagnosis not present

## 2018-02-10 DIAGNOSIS — R509 Fever, unspecified: Secondary | ICD-10-CM | POA: Diagnosis not present

## 2018-02-10 DIAGNOSIS — N2581 Secondary hyperparathyroidism of renal origin: Secondary | ICD-10-CM | POA: Diagnosis not present

## 2018-02-10 DIAGNOSIS — K659 Peritonitis, unspecified: Secondary | ICD-10-CM | POA: Diagnosis not present

## 2018-02-10 LAB — COMPREHENSIVE METABOLIC PANEL
ALT: 11 U/L — AB (ref 14–54)
AST: 13 U/L — AB (ref 15–41)
Albumin: 1.3 g/dL — ABNORMAL LOW (ref 3.5–5.0)
Alkaline Phosphatase: 114 U/L (ref 38–126)
Anion gap: 13 (ref 5–15)
BUN: 54 mg/dL — AB (ref 6–20)
CHLORIDE: 92 mmol/L — AB (ref 101–111)
CO2: 27 mmol/L (ref 22–32)
CREATININE: 9.73 mg/dL — AB (ref 0.44–1.00)
Calcium: 8.3 mg/dL — ABNORMAL LOW (ref 8.9–10.3)
GFR calc Af Amer: 4 mL/min — ABNORMAL LOW (ref >60)
GFR calc non Af Amer: 4 mL/min — ABNORMAL LOW (ref >60)
GLUCOSE: 89 mg/dL (ref 65–99)
Potassium: 3.9 mmol/L (ref 3.5–5.1)
SODIUM: 132 mmol/L — AB (ref 135–145)
Total Bilirubin: 0.6 mg/dL (ref 0.3–1.2)
Total Protein: 4.7 g/dL — ABNORMAL LOW (ref 6.5–8.1)

## 2018-02-10 LAB — CBC WITH DIFFERENTIAL/PLATELET
BASOS PCT: 0 %
Basophils Absolute: 0 10*3/uL (ref 0.0–0.1)
EOS PCT: 1 %
Eosinophils Absolute: 0.1 10*3/uL (ref 0.0–0.7)
HEMATOCRIT: 29.2 % — AB (ref 36.0–46.0)
Hemoglobin: 10.1 g/dL — ABNORMAL LOW (ref 12.0–15.0)
LYMPHS PCT: 3 %
Lymphs Abs: 0.3 10*3/uL — ABNORMAL LOW (ref 0.7–4.0)
MCH: 30.9 pg (ref 26.0–34.0)
MCHC: 34.6 g/dL (ref 30.0–36.0)
MCV: 89.3 fL (ref 78.0–100.0)
MONO ABS: 0.9 10*3/uL (ref 0.1–1.0)
Monocytes Relative: 10 %
Neutro Abs: 7.6 10*3/uL (ref 1.7–7.7)
Neutrophils Relative %: 86 %
Platelets: 154 10*3/uL (ref 150–400)
RBC: 3.27 MIL/uL — ABNORMAL LOW (ref 3.87–5.11)
RDW: 13.6 % (ref 11.5–15.5)
WBC: 8.9 10*3/uL (ref 4.0–10.5)

## 2018-02-10 LAB — I-STAT CG4 LACTIC ACID, ED: Lactic Acid, Venous: 1.36 mmol/L (ref 0.5–1.9)

## 2018-02-10 LAB — LIPASE, BLOOD: Lipase: 19 U/L (ref 11–51)

## 2018-02-10 LAB — PHOSPHORUS: Phosphorus: 2.7 mg/dL (ref 2.5–4.6)

## 2018-02-10 MED ORDER — SODIUM CHLORIDE 0.9 % IV BOLUS
500.0000 mL | Freq: Once | INTRAVENOUS | Status: AC
  Administered 2018-02-10: 500 mL via INTRAVENOUS

## 2018-02-10 NOTE — Discharge Instructions (Addendum)
Go to the [place] at [facility] this morning so they can get another sample of your peritoneal dialysis fluid to test for infection.

## 2018-02-10 NOTE — ED Notes (Signed)
Difficulty with blood draws and obtaining IV. IV team consulted.

## 2018-02-10 NOTE — ED Provider Notes (Signed)
[facility] DEPT Provider Note   CSN: [id number] Arrival date & time: 02/09/18  1823  Time seen 03:05 AM   History   Chief Complaint Chief Complaint  Patient presents with  . Fever    HPI Kimberly [name] is a 62 y.o. female.  HPI patient has been on peritoneal dialysis for the past 4 years.  She states she had a infection around the catheter site in the end of April.  She got intraperitoneal antibiotics but states she has not felt well since.  She states she feels "like a zombie".  She states she cannot walk because she feels weak.  She states she gets hungry but when she thinks the food it makes her nauseated.  She states May 14 she had 10 episodes of diarrhea and 10 episodes of vomiting.  She was seen Friday and they tried to give her an iron transfusion but they could not get an IV.  She states her last labs showed her potassium was low at 3.1, her phosphorus was low at 1.6, her iron was low and her albumin was low at 2.2.  She states "I need some fluids".  She states "I need electrolytes".  She states she wants her peritoneal fluid to be tested.  She states she started getting a fever on May 19, yesterday it was 100.8, tonight it was 100.7 she states she has had a cough "for 20 years".  She denies sore throat, rhinorrhea, nausea but she states she vomited in the ED.  She denies diarrhea.  She states she makes no urine.  She states sometimes she gets abdominal pain that she describes as a "stitch" only lasts a second.  PCP [name], MD Nephrology Dr Moshe Cipro  Past Medical History:  Diagnosis Date  . Anemia   . Asthma   . Diabetes mellitus    medication induced with  steroids  . Diverticulosis   . First degree AV block   . GERD (gastroesophageal reflux disease)   . Heart murmur    child  . History of chronic cough   . History of hiatal hernia   . History of hoarseness   . Hypertension    history; resolved on dialysis  . Hypothyroidism    . IgA nephropathy    on peritoneal dialysis  . Immunosuppression ([place])   . Kidney transplanted 01/2010   [facility], has had some rejection  . Patient on peritoneal dialysis ([place])   . Premature atrial beat   . Premature ventricular beat   . Renal transplant failure and rejection 2012  . Shingles 03/2011  . Stroke [facility]) October 2010, March 2013   cerebral aneurysm    Patient Active Problem List   Diagnosis Date Noted  . IgA nephropathy with nephrotic syndrome 10/30/2017  . Snoring 10/30/2017  . Excessive daytime sleepiness 10/30/2017  . Complaint related to dreams 10/30/2017  . Acute embolic stroke ([place]) 64/40/3474  . Pre-transplant evaluation for kidney transplant 05/08/2017  . Laryngopharyngeal reflux (LPR) 05/29/2016  . Cough, persistent 05/29/2016  . Chronic cough 09/15/2015  . Paroxysmal atrial fibrillation ([place]) 08/21/2015  . Acute pericarditis 08/05/2015  . Elevated troponin 08/05/2015  . Pain in the chest   . Atypical chest pain 08/04/2015  . LUL cavitary lesion post VATS 2015-cx neg 09/01/2014  . Pulmonary infiltrates 07/05/2014  . Gross hematuria 06/30/2014  . Kidney transplant failure 06/28/2014  . Renal failure 06/28/2014  . Peritoneal dialysis catheter in place March 2015 01/14/2014  . Ventral  hernia s/p lap repair w mesh March 2015 01/14/2014  . Secondary hyperparathyroidism (of renal origin) 04/20/2013  . ESRD (end stage renal disease) ([place]) 10/28/2012  . Diarrhea, unspecified 08/10/2012  . Urinary tract infection 08/10/2012  . Reactive airway disease 03/13/2012  . Pulmonary nodule 03/13/2012  . Stroke/cerebrovascular accident ([place]) 03/13/2012  . Vitamin D deficiency, unspecified 03/13/2012  . PVC's (premature ventricular contractions) 01/17/2012  . Stroke ([place]) 11/29/2011  . DM type 2 causing complication ([place]) 24/05/7352  . S/P kidney transplant   . IgA nephropathy   . GERD (gastroesophageal reflux disease) 12/07/2007  . Cough variant asthma vs uacs vs  components of both  11/09/2007  . Hypothyroidism, unspecified 10/12/2007  . ANEMIA 10/12/2007  . HTN (hypertension) 10/12/2007  . CARDIAC ARRHYTHMIA 10/12/2007  . Asthma 10/12/2007  . CKD (chronic kidney disease) stage V requiring chronic dialysis ([place]) 10/12/2007    Past Surgical History:  Procedure Laterality Date  . CAPD INSERTION  08/25/2008   open, Dr [name]  . CAPD INSERTION N/A 12/14/2013   Procedure: LAPAROSCOPIC INSERTION CONTINUOUS AMBULATORY PERITONEAL DIALYSIS  (CAPD) CATHETER;  Surgeon: [name], MD;  Location: [facility];  Service: General;  Laterality: N/A;  . CAPD REMOVAL  03/22/2010   Dr [name]  . EYE SURGERY Bilateral    radial keratotomy  . INSERTION OF MESH N/A 12/14/2013   Procedure: INSERTION OF MESH;  Surgeon: [name], MD;  Location: [facility];  Service: General;  Laterality: N/A;  . KIDNEY TRANSPLANT Right 01/2010   [place]  . LAPAROSCOPIC LYSIS OF ADHESIONS N/A 12/14/2013   Procedure: LAPAROSCOPIC LYSIS OF ADHESIONS;  Surgeon: [name], MD;  Location: [facility];  Service: General;  Laterality: N/A;  . UMBILICAL HERNIA REPAIR  10/25/2009   open with mesh,  Dr [name]  . URETER REVISION  04/2011   [facility]  . VENTRAL HERNIA REPAIR N/A 12/14/2013   Procedure: LAPAROSCOPIC VENTRAL HERNIA;  Surgeon: [name], MD;  Location: [facility];  Service: General;  Laterality: N/A;  . VIDEO ASSISTED THORACOSCOPY (VATS)/WEDGE RESECTION Left 09/01/2014   Procedure: VIDEO ASSISTED THORACOSCOPY (VATS)/WEDGE RESECTION;  Surgeon: [name], MD;  Location: [facility];  Service: Thoracic;  Laterality: Left;  Marland Kitchen VIDEO BRONCHOSCOPY Bilateral 07/05/2014   Procedure: VIDEO BRONCHOSCOPY WITH FLUORO;  Surgeon: [name], MD;  Location: [facility];  Service: Cardiopulmonary;  Laterality: Bilateral;     OB History   None      Home Medications    Prior to Admission medications   Medication Sig Start Date End Date Taking? Authorizing Provider  aspirin EC 325 MG  tablet Take 1 tablet (325 mg total) by mouth daily. 09/18/17   Melvenia Beam, MD  BREO ELLIPTA 200-25 MCG/INH AEPB Inhale 1 puff into the lungs every evening. 06/04/17   [provider]  calcitRIOL (ROCALTROL) 0.5 MCG capsule Take 0.5 mcg by mouth 3 (three) times a week.     [provider]  chlorpheniramine (CHLOR-TRIMETON) 4 MG tablet Take 4 mg by mouth every 4 (four) hours as needed for allergies.    [provider]  Cholecalciferol (VITAMIN D3) 3000 UNITS TABS Take 3,000 Units by mouth daily.    [provider]  cinacalcet (SENSIPAR) 30 MG tablet Take 30 mg by mouth daily with breakfast.     [provider]  Digestive Enzymes (DIGESTIVE ENZYME PO) Take 1 capsule by mouth daily.    [provider]  epoetin alfa (EPOGEN,PROCRIT) 29924 UNIT/ML injection Inject  10,000 Units into the skin every 30 (thirty) days.     [provider]  ferrous fumarate (HEMOCYTE - 106 MG FE) 325 (106 FE) MG TABS tablet Take 1 tablet by mouth daily.     [provider]  gentamicin cream (GARAMYCIN) 0.1 % Apply 1 application topically daily.  12/21/13   [provider]  lanthanum (FOSRENOL) 1000 MG chewable tablet Chew 1,000 mg by mouth daily.     [provider]  levothyroxine (SYNTHROID, LEVOTHROID) 50 MCG tablet Take 50 mcg by mouth daily before breakfast. 21mcg on Sun, Mon, Wed, Fri and Sat. 82mcg on Tues and Thurs.    [provider]  metoCLOPramide (REGLAN) 5 MG tablet Take 5 mg by mouth daily as needed for nausea or vomiting.     [provider]  potassium chloride SA (K-DUR,KLOR-CON) 20 MEQ tablet Take 20 mEq by mouth daily.    [provider]  SPIRIVA RESPIMAT 1.25 MCG/ACT AERS Inhale 2 puffs into the lungs daily. 05/16/17   [provider]    Family History Family History  Problem Relation Age of Onset  . Coronary artery disease Mother   . Emphysema Mother        smoked  . Ovarian  cancer Mother   . Heart disease Mother   . Stroke Maternal Uncle   . Diabetes Maternal Uncle   . Kidney disease Maternal Uncle   . Liver disease Father     Social History Social History   Tobacco Use  . Smoking status: Never Smoker  . Smokeless tobacco: Never Used  Substance Use Topics  . Alcohol use: Yes    Comment: ocassional  . Drug use: No  retired in 2016   Allergies   Other; Dapsone; Pregabalin; Sulfonamide derivatives; and Tramadol   Review of Systems Review of Systems  All other systems reviewed and are negative.    Physical Exam Updated Vital Signs BP 109/74 (BP Location: Right Arm)   Pulse 78   Temp 99 F (37.2 C) (Oral)   Resp 16   Ht 5\' 5"  (1.651 m)   Wt 63.5 kg (140 lb)   SpO2 100%   BMI 23.30 kg/m   Vital signs normal    Physical Exam  Constitutional: She is oriented to person, place, and time. She appears well-developed and well-nourished.  Non-toxic appearance. She does not appear ill. No distress.  Appears chronically ill  HENT:  Head: Normocephalic and atraumatic.  Right Ear: External ear normal.  Left Ear: External ear normal.  Nose: Nose normal. No mucosal edema or rhinorrhea.  Mouth/Throat: Oropharynx is clear and moist and mucous membranes are normal. No dental abscesses or uvula swelling.  Eyes: Pupils are equal, round, and reactive to light. Conjunctivae and EOM are normal.  Neck: Normal range of motion and full passive range of motion without pain. Neck supple.  Cardiovascular: Normal rate, regular rhythm and normal heart sounds. Exam reveals no gallop and no friction rub.  No murmur heard. Pulmonary/Chest: Effort normal and breath sounds normal. No respiratory distress. She has no wheezes. She has no rhonchi. She has no rales. She exhibits no tenderness and no crepitus.  Abdominal: Soft. Normal appearance and bowel sounds are normal. She exhibits no distension. There is no tenderness. There is no rebound and no guarding.    Musculoskeletal: Normal range of motion. She exhibits no edema or tenderness.  Moves all extremities well.   Neurological: She is alert and oriented to person, place, and time.  She has normal strength. No cranial nerve deficit.  Skin: Skin is warm, dry and intact. No rash noted. No erythema. There is pallor.  Psychiatric: She has a normal mood and affect. Her speech is normal and behavior is normal. Her mood appears not anxious.  Nursing note and vitals reviewed.    ED Treatments / Results  Labs (all labs ordered are listed, but only abnormal results are displayed) Results for orders placed or performed during the hospital encounter of 02/10/18  Phosphorus  Result Value Ref Range   Phosphorus 2.7 2.5 - 4.6 mg/dL  Comprehensive metabolic panel  Result Value Ref Range   Sodium 132 (L) 135 - 145 mmol/L   Potassium 3.9 3.5 - 5.1 mmol/L   Chloride 92 (L) 101 - 111 mmol/L   CO2 27 22 - 32 mmol/L   Glucose, Bld 89 65 - 99 mg/dL   BUN 54 (H) 6 - 20 mg/dL   Creatinine, Ser 9.73 (H) 0.44 - 1.00 mg/dL   Calcium 8.3 (L) 8.9 - 10.3 mg/dL   Total Protein 4.7 (L) 6.5 - 8.1 g/dL   Albumin 1.3 (L) 3.5 - 5.0 g/dL   AST 13 (L) 15 - 41 U/L   ALT 11 (L) 14 - 54 U/L   Alkaline Phosphatase 114 38 - 126 U/L   Total Bilirubin 0.6 0.3 - 1.2 mg/dL   GFR calc non Af Amer 4 (L) >60 mL/min   GFR calc Af Amer 4 (L) >60 mL/min   Anion gap 13 5 - 15  Lipase, blood  Result Value Ref Range   Lipase 19 11 - 51 U/L  CBC with Differential  Result Value Ref Range   WBC 8.9 4.0 - 10.5 K/uL   RBC 3.27 (L) 3.87 - 5.11 MIL/uL   Hemoglobin 10.1 (L) 12.0 - 15.0 g/dL   HCT 29.2 (L) 36.0 - 46.0 %   MCV 89.3 78.0 - 100.0 fL   MCH 30.9 26.0 - 34.0 pg   MCHC 34.6 30.0 - 36.0 g/dL   RDW 13.6 11.5 - 15.5 %   Platelets 154 150 - 400 K/uL   Neutrophils Relative % 86 %   Neutro Abs 7.6 1.7 - 7.7 K/uL   Lymphocytes Relative 3 %   Lymphs Abs 0.3 (L) 0.7 - 4.0 K/uL   Monocytes Relative 10 %   Monocytes Absolute 0.9  0.1 - 1.0 K/uL   Eosinophils Relative 1 %   Eosinophils Absolute 0.1 0.0 - 0.7 K/uL   Basophils Relative 0 %   Basophils Absolute 0.0 0.0 - 0.1 K/uL  I-Stat CG4 Lactic Acid, ED  Result Value Ref Range   Lactic Acid, Venous 1.36 0.5 - 1.9 mmol/L    Laboratory interpretation all normal except chronic renal failure, mild anemia,   EKG None  Radiology Dg Chest 2 View  Result Date: 02/10/2018 CLINICAL DATA:  Cough and fever for 1 week. EXAM: CHEST - 2 VIEW COMPARISON:  CT 10/29/2017 FINDINGS: The heart is normal in size. Normal mediastinal contours. No pulmonary edema. Small upper lobe predominant nodular opacities were better characterized on prior CT. Chain sutures in the left upper lung. No confluent airspace disease. No pleural effusion or pneumothorax. No acute osseous abnormalities. IMPRESSION: Small upper lobe predominant nodular densities were better characterized on prior CT, likely secondary to chronic indolent atypical infection such as MAI. No new abnormality. Electronically Signed   By: Jeb Levering M.D.   On: 02/10/2018 03:49    Procedures Procedures (including critical care  time)  Medications Ordered in ED Medications  sodium chloride 0.9 % bolus 500 mL (has no administration in time range)  sodium chloride 0.9 % bolus 500 mL (500 mLs Intravenous New Bag/Given 02/10/18 0622)     Initial Impression / Assessment and Plan / ED Course  I have reviewed the triage vital signs and the nursing notes.  Pertinent labs & imaging results that were available during my care of the patient were reviewed by me and considered in my medical decision making (see chart for details).   Patient was requesting IV fluids, she was given 500 cc bolus.  Lab work was ordered.  Other lab work was ordered around to lab states that did not see until 6:30.  Recheck at 625 still waiting for her laboratory test results.  This was explained to the patient.  Patient was discussed with Dr. Lorrene Reid,  nephrologist at 7:16.  She states have patient go to [facility] kidney center to the home training unit and they will get fluid from her peritoneal dialysis, unless I find another reason to have her admitted which I haven't..  This was relayed to the patient.  She states she feels like she still dehydrated, she was given additional 500 cc of fluid while waiting on her CBC.  Final Clinical Impressions(s) / ED Diagnoses   Final diagnoses:  Lethargy    ED Discharge Orders    None      Plan discharge     Rolland Porter, MD 02/10/18 [phone]

## 2018-02-11 DIAGNOSIS — R509 Fever, unspecified: Secondary | ICD-10-CM | POA: Diagnosis not present

## 2018-02-11 DIAGNOSIS — D631 Anemia in chronic kidney disease: Secondary | ICD-10-CM | POA: Diagnosis not present

## 2018-02-11 DIAGNOSIS — K659 Peritonitis, unspecified: Secondary | ICD-10-CM | POA: Diagnosis not present

## 2018-02-11 DIAGNOSIS — N186 End stage renal disease: Secondary | ICD-10-CM | POA: Diagnosis not present

## 2018-02-11 DIAGNOSIS — N2581 Secondary hyperparathyroidism of renal origin: Secondary | ICD-10-CM | POA: Diagnosis not present

## 2018-02-11 DIAGNOSIS — E876 Hypokalemia: Secondary | ICD-10-CM | POA: Diagnosis not present

## 2018-02-12 DIAGNOSIS — E876 Hypokalemia: Secondary | ICD-10-CM | POA: Diagnosis not present

## 2018-02-12 DIAGNOSIS — R509 Fever, unspecified: Secondary | ICD-10-CM | POA: Diagnosis not present

## 2018-02-12 DIAGNOSIS — D631 Anemia in chronic kidney disease: Secondary | ICD-10-CM | POA: Diagnosis not present

## 2018-02-12 DIAGNOSIS — N2581 Secondary hyperparathyroidism of renal origin: Secondary | ICD-10-CM | POA: Diagnosis not present

## 2018-02-12 DIAGNOSIS — K659 Peritonitis, unspecified: Secondary | ICD-10-CM | POA: Diagnosis not present

## 2018-02-12 DIAGNOSIS — N186 End stage renal disease: Secondary | ICD-10-CM | POA: Diagnosis not present

## 2018-02-13 DIAGNOSIS — K659 Peritonitis, unspecified: Secondary | ICD-10-CM | POA: Diagnosis not present

## 2018-02-13 DIAGNOSIS — N186 End stage renal disease: Secondary | ICD-10-CM | POA: Diagnosis not present

## 2018-02-13 DIAGNOSIS — D631 Anemia in chronic kidney disease: Secondary | ICD-10-CM | POA: Diagnosis not present

## 2018-02-13 DIAGNOSIS — Z992 Dependence on renal dialysis: Secondary | ICD-10-CM | POA: Diagnosis not present

## 2018-02-13 DIAGNOSIS — E876 Hypokalemia: Secondary | ICD-10-CM | POA: Diagnosis not present

## 2018-02-13 DIAGNOSIS — N2581 Secondary hyperparathyroidism of renal origin: Secondary | ICD-10-CM | POA: Diagnosis not present

## 2018-02-13 DIAGNOSIS — R509 Fever, unspecified: Secondary | ICD-10-CM | POA: Diagnosis not present

## 2018-02-13 HISTORY — PX: OTHER SURGICAL HISTORY: SHX169

## 2018-02-14 DIAGNOSIS — E876 Hypokalemia: Secondary | ICD-10-CM | POA: Diagnosis not present

## 2018-02-14 DIAGNOSIS — D631 Anemia in chronic kidney disease: Secondary | ICD-10-CM | POA: Diagnosis not present

## 2018-02-14 DIAGNOSIS — R509 Fever, unspecified: Secondary | ICD-10-CM | POA: Diagnosis not present

## 2018-02-14 DIAGNOSIS — N186 End stage renal disease: Secondary | ICD-10-CM | POA: Diagnosis not present

## 2018-02-14 DIAGNOSIS — K659 Peritonitis, unspecified: Secondary | ICD-10-CM | POA: Diagnosis not present

## 2018-02-14 DIAGNOSIS — N2581 Secondary hyperparathyroidism of renal origin: Secondary | ICD-10-CM | POA: Diagnosis not present

## 2018-02-15 DIAGNOSIS — N2581 Secondary hyperparathyroidism of renal origin: Secondary | ICD-10-CM | POA: Diagnosis not present

## 2018-02-15 DIAGNOSIS — N186 End stage renal disease: Secondary | ICD-10-CM | POA: Diagnosis not present

## 2018-02-15 DIAGNOSIS — R509 Fever, unspecified: Secondary | ICD-10-CM | POA: Diagnosis not present

## 2018-02-15 DIAGNOSIS — E876 Hypokalemia: Secondary | ICD-10-CM | POA: Diagnosis not present

## 2018-02-15 DIAGNOSIS — D631 Anemia in chronic kidney disease: Secondary | ICD-10-CM | POA: Diagnosis not present

## 2018-02-15 DIAGNOSIS — K659 Peritonitis, unspecified: Secondary | ICD-10-CM | POA: Diagnosis not present

## 2018-02-15 LAB — CULTURE, BLOOD (ROUTINE X 2)
CULTURE: NO GROWTH
Culture: NO GROWTH
SPECIAL REQUESTS: ADEQUATE
SPECIAL REQUESTS: ADEQUATE

## 2018-02-16 DIAGNOSIS — N186 End stage renal disease: Secondary | ICD-10-CM | POA: Diagnosis not present

## 2018-02-16 DIAGNOSIS — E876 Hypokalemia: Secondary | ICD-10-CM | POA: Diagnosis not present

## 2018-02-16 DIAGNOSIS — R509 Fever, unspecified: Secondary | ICD-10-CM | POA: Diagnosis not present

## 2018-02-16 DIAGNOSIS — N2581 Secondary hyperparathyroidism of renal origin: Secondary | ICD-10-CM | POA: Diagnosis not present

## 2018-02-16 DIAGNOSIS — K659 Peritonitis, unspecified: Secondary | ICD-10-CM | POA: Diagnosis not present

## 2018-02-16 DIAGNOSIS — D631 Anemia in chronic kidney disease: Secondary | ICD-10-CM | POA: Diagnosis not present

## 2018-02-17 DIAGNOSIS — K659 Peritonitis, unspecified: Secondary | ICD-10-CM | POA: Diagnosis not present

## 2018-02-17 DIAGNOSIS — R509 Fever, unspecified: Secondary | ICD-10-CM | POA: Diagnosis not present

## 2018-02-17 DIAGNOSIS — N186 End stage renal disease: Secondary | ICD-10-CM | POA: Diagnosis not present

## 2018-02-17 DIAGNOSIS — D631 Anemia in chronic kidney disease: Secondary | ICD-10-CM | POA: Diagnosis not present

## 2018-02-17 DIAGNOSIS — E876 Hypokalemia: Secondary | ICD-10-CM | POA: Diagnosis not present

## 2018-02-17 DIAGNOSIS — N2581 Secondary hyperparathyroidism of renal origin: Secondary | ICD-10-CM | POA: Diagnosis not present

## 2018-02-18 DIAGNOSIS — K659 Peritonitis, unspecified: Secondary | ICD-10-CM | POA: Diagnosis not present

## 2018-02-18 DIAGNOSIS — R509 Fever, unspecified: Secondary | ICD-10-CM | POA: Diagnosis not present

## 2018-02-18 DIAGNOSIS — D631 Anemia in chronic kidney disease: Secondary | ICD-10-CM | POA: Diagnosis not present

## 2018-02-18 DIAGNOSIS — E876 Hypokalemia: Secondary | ICD-10-CM | POA: Diagnosis not present

## 2018-02-18 DIAGNOSIS — N186 End stage renal disease: Secondary | ICD-10-CM | POA: Diagnosis not present

## 2018-02-18 DIAGNOSIS — N2581 Secondary hyperparathyroidism of renal origin: Secondary | ICD-10-CM | POA: Diagnosis not present

## 2018-02-19 DIAGNOSIS — R509 Fever, unspecified: Secondary | ICD-10-CM | POA: Diagnosis not present

## 2018-02-19 DIAGNOSIS — N2581 Secondary hyperparathyroidism of renal origin: Secondary | ICD-10-CM | POA: Diagnosis not present

## 2018-02-19 DIAGNOSIS — E876 Hypokalemia: Secondary | ICD-10-CM | POA: Diagnosis not present

## 2018-02-19 DIAGNOSIS — D631 Anemia in chronic kidney disease: Secondary | ICD-10-CM | POA: Diagnosis not present

## 2018-02-19 DIAGNOSIS — N186 End stage renal disease: Secondary | ICD-10-CM | POA: Diagnosis not present

## 2018-02-19 DIAGNOSIS — K659 Peritonitis, unspecified: Secondary | ICD-10-CM | POA: Diagnosis not present

## 2018-02-20 DIAGNOSIS — N186 End stage renal disease: Secondary | ICD-10-CM | POA: Diagnosis not present

## 2018-02-20 DIAGNOSIS — R509 Fever, unspecified: Secondary | ICD-10-CM | POA: Diagnosis not present

## 2018-02-20 DIAGNOSIS — N2581 Secondary hyperparathyroidism of renal origin: Secondary | ICD-10-CM | POA: Diagnosis not present

## 2018-02-20 DIAGNOSIS — D631 Anemia in chronic kidney disease: Secondary | ICD-10-CM | POA: Diagnosis not present

## 2018-02-20 DIAGNOSIS — E876 Hypokalemia: Secondary | ICD-10-CM | POA: Diagnosis not present

## 2018-02-20 DIAGNOSIS — K659 Peritonitis, unspecified: Secondary | ICD-10-CM | POA: Diagnosis not present

## 2018-02-21 DIAGNOSIS — T8612 Kidney transplant failure: Secondary | ICD-10-CM | POA: Diagnosis not present

## 2018-02-21 DIAGNOSIS — Z992 Dependence on renal dialysis: Secondary | ICD-10-CM | POA: Diagnosis not present

## 2018-02-21 DIAGNOSIS — D631 Anemia in chronic kidney disease: Secondary | ICD-10-CM | POA: Diagnosis not present

## 2018-02-21 DIAGNOSIS — N186 End stage renal disease: Secondary | ICD-10-CM | POA: Diagnosis not present

## 2018-02-21 DIAGNOSIS — N2581 Secondary hyperparathyroidism of renal origin: Secondary | ICD-10-CM | POA: Diagnosis not present

## 2018-02-22 DIAGNOSIS — N2581 Secondary hyperparathyroidism of renal origin: Secondary | ICD-10-CM | POA: Diagnosis not present

## 2018-02-22 DIAGNOSIS — N186 End stage renal disease: Secondary | ICD-10-CM | POA: Diagnosis not present

## 2018-02-22 DIAGNOSIS — D631 Anemia in chronic kidney disease: Secondary | ICD-10-CM | POA: Diagnosis not present

## 2018-02-23 DIAGNOSIS — N186 End stage renal disease: Secondary | ICD-10-CM | POA: Diagnosis not present

## 2018-02-23 DIAGNOSIS — D631 Anemia in chronic kidney disease: Secondary | ICD-10-CM | POA: Diagnosis not present

## 2018-02-23 DIAGNOSIS — N2581 Secondary hyperparathyroidism of renal origin: Secondary | ICD-10-CM | POA: Diagnosis not present

## 2018-02-24 DIAGNOSIS — D631 Anemia in chronic kidney disease: Secondary | ICD-10-CM | POA: Diagnosis not present

## 2018-02-24 DIAGNOSIS — N186 End stage renal disease: Secondary | ICD-10-CM | POA: Diagnosis not present

## 2018-02-24 DIAGNOSIS — N2581 Secondary hyperparathyroidism of renal origin: Secondary | ICD-10-CM | POA: Diagnosis not present

## 2018-02-25 DIAGNOSIS — B372 Candidiasis of skin and nail: Secondary | ICD-10-CM | POA: Diagnosis not present

## 2018-02-25 DIAGNOSIS — Z992 Dependence on renal dialysis: Secondary | ICD-10-CM | POA: Diagnosis not present

## 2018-02-25 DIAGNOSIS — T8612 Kidney transplant failure: Secondary | ICD-10-CM | POA: Diagnosis not present

## 2018-02-25 DIAGNOSIS — N2581 Secondary hyperparathyroidism of renal origin: Secondary | ICD-10-CM | POA: Diagnosis not present

## 2018-02-25 DIAGNOSIS — N185 Chronic kidney disease, stage 5: Secondary | ICD-10-CM | POA: Diagnosis not present

## 2018-02-25 DIAGNOSIS — N186 End stage renal disease: Secondary | ICD-10-CM | POA: Diagnosis not present

## 2018-02-25 DIAGNOSIS — T85611A Breakdown (mechanical) of intraperitoneal dialysis catheter, initial encounter: Secondary | ICD-10-CM | POA: Diagnosis not present

## 2018-02-25 DIAGNOSIS — Z8673 Personal history of transient ischemic attack (TIA), and cerebral infarction without residual deficits: Secondary | ICD-10-CM | POA: Diagnosis not present

## 2018-02-25 DIAGNOSIS — D631 Anemia in chronic kidney disease: Secondary | ICD-10-CM | POA: Diagnosis not present

## 2018-02-26 DIAGNOSIS — N186 End stage renal disease: Secondary | ICD-10-CM | POA: Diagnosis not present

## 2018-02-26 DIAGNOSIS — N2581 Secondary hyperparathyroidism of renal origin: Secondary | ICD-10-CM | POA: Diagnosis not present

## 2018-02-26 DIAGNOSIS — D631 Anemia in chronic kidney disease: Secondary | ICD-10-CM | POA: Diagnosis not present

## 2018-02-27 DIAGNOSIS — D631 Anemia in chronic kidney disease: Secondary | ICD-10-CM | POA: Diagnosis not present

## 2018-02-27 DIAGNOSIS — N2581 Secondary hyperparathyroidism of renal origin: Secondary | ICD-10-CM | POA: Diagnosis not present

## 2018-02-27 DIAGNOSIS — N186 End stage renal disease: Secondary | ICD-10-CM | POA: Diagnosis not present

## 2018-02-28 DIAGNOSIS — N186 End stage renal disease: Secondary | ICD-10-CM | POA: Diagnosis not present

## 2018-02-28 DIAGNOSIS — N2581 Secondary hyperparathyroidism of renal origin: Secondary | ICD-10-CM | POA: Diagnosis not present

## 2018-02-28 DIAGNOSIS — D631 Anemia in chronic kidney disease: Secondary | ICD-10-CM | POA: Diagnosis not present

## 2018-03-01 DIAGNOSIS — N186 End stage renal disease: Secondary | ICD-10-CM | POA: Diagnosis not present

## 2018-03-01 DIAGNOSIS — D631 Anemia in chronic kidney disease: Secondary | ICD-10-CM | POA: Diagnosis not present

## 2018-03-01 DIAGNOSIS — N2581 Secondary hyperparathyroidism of renal origin: Secondary | ICD-10-CM | POA: Diagnosis not present

## 2018-03-02 DIAGNOSIS — N2581 Secondary hyperparathyroidism of renal origin: Secondary | ICD-10-CM | POA: Diagnosis not present

## 2018-03-02 DIAGNOSIS — N186 End stage renal disease: Secondary | ICD-10-CM | POA: Diagnosis not present

## 2018-03-02 DIAGNOSIS — D631 Anemia in chronic kidney disease: Secondary | ICD-10-CM | POA: Diagnosis not present

## 2018-03-03 ENCOUNTER — Ambulatory Visit: Payer: Self-pay | Admitting: Nurse Practitioner

## 2018-03-03 DIAGNOSIS — D631 Anemia in chronic kidney disease: Secondary | ICD-10-CM | POA: Diagnosis not present

## 2018-03-03 DIAGNOSIS — N186 End stage renal disease: Secondary | ICD-10-CM | POA: Diagnosis not present

## 2018-03-03 DIAGNOSIS — N2581 Secondary hyperparathyroidism of renal origin: Secondary | ICD-10-CM | POA: Diagnosis not present

## 2018-03-04 DIAGNOSIS — D631 Anemia in chronic kidney disease: Secondary | ICD-10-CM | POA: Diagnosis not present

## 2018-03-04 DIAGNOSIS — N2581 Secondary hyperparathyroidism of renal origin: Secondary | ICD-10-CM | POA: Diagnosis not present

## 2018-03-04 DIAGNOSIS — N186 End stage renal disease: Secondary | ICD-10-CM | POA: Diagnosis not present

## 2018-03-05 DIAGNOSIS — N2581 Secondary hyperparathyroidism of renal origin: Secondary | ICD-10-CM | POA: Diagnosis not present

## 2018-03-05 DIAGNOSIS — D631 Anemia in chronic kidney disease: Secondary | ICD-10-CM | POA: Diagnosis not present

## 2018-03-05 DIAGNOSIS — N186 End stage renal disease: Secondary | ICD-10-CM | POA: Diagnosis not present

## 2018-03-06 DIAGNOSIS — Z4902 Encounter for fitting and adjustment of peritoneal dialysis catheter: Secondary | ICD-10-CM | POA: Diagnosis not present

## 2018-03-06 DIAGNOSIS — D631 Anemia in chronic kidney disease: Secondary | ICD-10-CM | POA: Diagnosis not present

## 2018-03-06 DIAGNOSIS — N186 End stage renal disease: Secondary | ICD-10-CM | POA: Diagnosis not present

## 2018-03-06 DIAGNOSIS — N2581 Secondary hyperparathyroidism of renal origin: Secondary | ICD-10-CM | POA: Diagnosis not present

## 2018-03-06 DIAGNOSIS — N185 Chronic kidney disease, stage 5: Secondary | ICD-10-CM | POA: Diagnosis not present

## 2018-03-06 DIAGNOSIS — T8571XA Infection and inflammatory reaction due to peritoneal dialysis catheter, initial encounter: Secondary | ICD-10-CM | POA: Diagnosis not present

## 2018-03-07 DIAGNOSIS — D631 Anemia in chronic kidney disease: Secondary | ICD-10-CM | POA: Diagnosis not present

## 2018-03-07 DIAGNOSIS — N2581 Secondary hyperparathyroidism of renal origin: Secondary | ICD-10-CM | POA: Diagnosis not present

## 2018-03-07 DIAGNOSIS — N186 End stage renal disease: Secondary | ICD-10-CM | POA: Diagnosis not present

## 2018-03-08 DIAGNOSIS — N2581 Secondary hyperparathyroidism of renal origin: Secondary | ICD-10-CM | POA: Diagnosis not present

## 2018-03-08 DIAGNOSIS — N186 End stage renal disease: Secondary | ICD-10-CM | POA: Diagnosis not present

## 2018-03-08 DIAGNOSIS — D631 Anemia in chronic kidney disease: Secondary | ICD-10-CM | POA: Diagnosis not present

## 2018-03-10 DIAGNOSIS — D631 Anemia in chronic kidney disease: Secondary | ICD-10-CM | POA: Diagnosis not present

## 2018-03-10 DIAGNOSIS — N2581 Secondary hyperparathyroidism of renal origin: Secondary | ICD-10-CM | POA: Diagnosis not present

## 2018-03-10 DIAGNOSIS — N186 End stage renal disease: Secondary | ICD-10-CM | POA: Diagnosis not present

## 2018-03-11 DIAGNOSIS — N186 End stage renal disease: Secondary | ICD-10-CM | POA: Diagnosis not present

## 2018-03-12 DIAGNOSIS — D631 Anemia in chronic kidney disease: Secondary | ICD-10-CM | POA: Diagnosis not present

## 2018-03-12 DIAGNOSIS — N2581 Secondary hyperparathyroidism of renal origin: Secondary | ICD-10-CM | POA: Diagnosis not present

## 2018-03-12 DIAGNOSIS — N186 End stage renal disease: Secondary | ICD-10-CM | POA: Diagnosis not present

## 2018-03-13 ENCOUNTER — Ambulatory Visit (HOSPITAL_COMMUNITY)
Admission: RE | Admit: 2018-03-13 | Discharge: 2018-03-13 | Disposition: A | Discharge status: Home / Self Care | Payer: BLUE CROSS/BLUE SHIELD | Source: Ambulatory Visit | Attending: Nephrology | Admitting: Nephrology

## 2018-03-13 DIAGNOSIS — D649 Anemia, unspecified: Secondary | ICD-10-CM | POA: Diagnosis not present

## 2018-03-13 LAB — ABO/RH: ABO/RH(D): B POS

## 2018-03-13 LAB — PREPARE RBC (CROSSMATCH)

## 2018-03-13 MED ORDER — SODIUM CHLORIDE 0.9% IV SOLUTION
Freq: Once | INTRAVENOUS | Status: AC
  Administered 2018-03-13: 10:00:00 via INTRAVENOUS

## 2018-03-13 NOTE — Discharge Instructions (Signed)

## 2018-03-13 NOTE — Progress Notes (Signed)
PATIENT CARE CENTER NOTE  Diagnosis: Anemia    Provider: Dr. Jimmy Footman   Procedure: 1 unit PRBC   Note: Patient received 1 unit of blood. Patient's IV infiltrated 15 minutes after start of transfusion. Transfusion was delayed due to difficulty obtaining IV access. The unit of blood was able to be administered within required time frame of 4 hours. Patient tolerated transfusion well with no adverse reaction. Vital signs remained stable throughout transfusion. Discharge instructions given to patient. Patient alert, oriented and ambulatory to wheelchair at discharge.

## 2018-03-14 DIAGNOSIS — N186 End stage renal disease: Secondary | ICD-10-CM | POA: Diagnosis not present

## 2018-03-14 DIAGNOSIS — N2581 Secondary hyperparathyroidism of renal origin: Secondary | ICD-10-CM | POA: Diagnosis not present

## 2018-03-14 DIAGNOSIS — D509 Iron deficiency anemia, unspecified: Secondary | ICD-10-CM | POA: Diagnosis not present

## 2018-03-14 DIAGNOSIS — D631 Anemia in chronic kidney disease: Secondary | ICD-10-CM | POA: Diagnosis not present

## 2018-03-16 LAB — TYPE AND SCREEN
ABO/RH(D): B POS
Antibody Screen: NEGATIVE
UNIT DIVISION: 0

## 2018-03-16 LAB — BPAM RBC
Blood Product Expiration Date: 201907202359
ISSUE DATE / TIME: 201906210931
UNIT TYPE AND RH: 7300

## 2018-03-17 DIAGNOSIS — D631 Anemia in chronic kidney disease: Secondary | ICD-10-CM | POA: Diagnosis not present

## 2018-03-17 DIAGNOSIS — D509 Iron deficiency anemia, unspecified: Secondary | ICD-10-CM | POA: Diagnosis not present

## 2018-03-17 DIAGNOSIS — N186 End stage renal disease: Secondary | ICD-10-CM | POA: Diagnosis not present

## 2018-03-17 DIAGNOSIS — N2581 Secondary hyperparathyroidism of renal origin: Secondary | ICD-10-CM | POA: Diagnosis not present

## 2018-03-19 ENCOUNTER — Ambulatory Visit: Payer: Medicare Other | Admitting: Nurse Practitioner

## 2018-03-19 DIAGNOSIS — N2581 Secondary hyperparathyroidism of renal origin: Secondary | ICD-10-CM | POA: Diagnosis not present

## 2018-03-19 DIAGNOSIS — N186 End stage renal disease: Secondary | ICD-10-CM | POA: Diagnosis not present

## 2018-03-19 DIAGNOSIS — D509 Iron deficiency anemia, unspecified: Secondary | ICD-10-CM | POA: Diagnosis not present

## 2018-03-19 DIAGNOSIS — D631 Anemia in chronic kidney disease: Secondary | ICD-10-CM | POA: Diagnosis not present

## 2018-03-21 DIAGNOSIS — N2581 Secondary hyperparathyroidism of renal origin: Secondary | ICD-10-CM | POA: Diagnosis not present

## 2018-03-21 DIAGNOSIS — D509 Iron deficiency anemia, unspecified: Secondary | ICD-10-CM | POA: Diagnosis not present

## 2018-03-21 DIAGNOSIS — D631 Anemia in chronic kidney disease: Secondary | ICD-10-CM | POA: Diagnosis not present

## 2018-03-21 DIAGNOSIS — N186 End stage renal disease: Secondary | ICD-10-CM | POA: Diagnosis not present

## 2018-03-23 DIAGNOSIS — Z992 Dependence on renal dialysis: Secondary | ICD-10-CM | POA: Diagnosis not present

## 2018-03-23 DIAGNOSIS — T8612 Kidney transplant failure: Secondary | ICD-10-CM | POA: Diagnosis not present

## 2018-03-23 DIAGNOSIS — N186 End stage renal disease: Secondary | ICD-10-CM | POA: Diagnosis not present

## 2018-03-24 DIAGNOSIS — D631 Anemia in chronic kidney disease: Secondary | ICD-10-CM | POA: Diagnosis not present

## 2018-03-24 DIAGNOSIS — N2581 Secondary hyperparathyroidism of renal origin: Secondary | ICD-10-CM | POA: Diagnosis not present

## 2018-03-24 DIAGNOSIS — N186 End stage renal disease: Secondary | ICD-10-CM | POA: Diagnosis not present

## 2018-03-24 DIAGNOSIS — D509 Iron deficiency anemia, unspecified: Secondary | ICD-10-CM | POA: Diagnosis not present

## 2018-03-25 ENCOUNTER — Other Ambulatory Visit: Payer: Self-pay

## 2018-03-25 DIAGNOSIS — Z992 Dependence on renal dialysis: Principal | ICD-10-CM

## 2018-03-25 DIAGNOSIS — Z01812 Encounter for preprocedural laboratory examination: Secondary | ICD-10-CM

## 2018-03-25 DIAGNOSIS — N186 End stage renal disease: Secondary | ICD-10-CM

## 2018-03-26 DIAGNOSIS — N186 End stage renal disease: Secondary | ICD-10-CM | POA: Diagnosis not present

## 2018-03-26 DIAGNOSIS — D509 Iron deficiency anemia, unspecified: Secondary | ICD-10-CM | POA: Diagnosis not present

## 2018-03-26 DIAGNOSIS — N2581 Secondary hyperparathyroidism of renal origin: Secondary | ICD-10-CM | POA: Diagnosis not present

## 2018-03-26 DIAGNOSIS — D631 Anemia in chronic kidney disease: Secondary | ICD-10-CM | POA: Diagnosis not present

## 2018-03-28 DIAGNOSIS — N186 End stage renal disease: Secondary | ICD-10-CM | POA: Diagnosis not present

## 2018-03-28 DIAGNOSIS — N2581 Secondary hyperparathyroidism of renal origin: Secondary | ICD-10-CM | POA: Diagnosis not present

## 2018-03-28 DIAGNOSIS — D631 Anemia in chronic kidney disease: Secondary | ICD-10-CM | POA: Diagnosis not present

## 2018-03-28 DIAGNOSIS — D509 Iron deficiency anemia, unspecified: Secondary | ICD-10-CM | POA: Diagnosis not present

## 2018-03-31 DIAGNOSIS — N2581 Secondary hyperparathyroidism of renal origin: Secondary | ICD-10-CM | POA: Diagnosis not present

## 2018-03-31 DIAGNOSIS — D631 Anemia in chronic kidney disease: Secondary | ICD-10-CM | POA: Diagnosis not present

## 2018-03-31 DIAGNOSIS — D509 Iron deficiency anemia, unspecified: Secondary | ICD-10-CM | POA: Diagnosis not present

## 2018-03-31 DIAGNOSIS — N186 End stage renal disease: Secondary | ICD-10-CM | POA: Diagnosis not present

## 2018-04-02 DIAGNOSIS — D509 Iron deficiency anemia, unspecified: Secondary | ICD-10-CM | POA: Diagnosis not present

## 2018-04-02 DIAGNOSIS — N186 End stage renal disease: Secondary | ICD-10-CM | POA: Diagnosis not present

## 2018-04-02 DIAGNOSIS — N2581 Secondary hyperparathyroidism of renal origin: Secondary | ICD-10-CM | POA: Diagnosis not present

## 2018-04-02 DIAGNOSIS — D631 Anemia in chronic kidney disease: Secondary | ICD-10-CM | POA: Diagnosis not present

## 2018-04-04 DIAGNOSIS — N2581 Secondary hyperparathyroidism of renal origin: Secondary | ICD-10-CM | POA: Diagnosis not present

## 2018-04-04 DIAGNOSIS — N186 End stage renal disease: Secondary | ICD-10-CM | POA: Diagnosis not present

## 2018-04-04 DIAGNOSIS — D509 Iron deficiency anemia, unspecified: Secondary | ICD-10-CM | POA: Diagnosis not present

## 2018-04-04 DIAGNOSIS — D631 Anemia in chronic kidney disease: Secondary | ICD-10-CM | POA: Diagnosis not present

## 2018-04-07 DIAGNOSIS — D631 Anemia in chronic kidney disease: Secondary | ICD-10-CM | POA: Diagnosis not present

## 2018-04-07 DIAGNOSIS — N186 End stage renal disease: Secondary | ICD-10-CM | POA: Diagnosis not present

## 2018-04-07 DIAGNOSIS — N2581 Secondary hyperparathyroidism of renal origin: Secondary | ICD-10-CM | POA: Diagnosis not present

## 2018-04-07 DIAGNOSIS — D509 Iron deficiency anemia, unspecified: Secondary | ICD-10-CM | POA: Diagnosis not present

## 2018-04-09 DIAGNOSIS — D631 Anemia in chronic kidney disease: Secondary | ICD-10-CM | POA: Diagnosis not present

## 2018-04-09 DIAGNOSIS — N186 End stage renal disease: Secondary | ICD-10-CM | POA: Diagnosis not present

## 2018-04-09 DIAGNOSIS — N2581 Secondary hyperparathyroidism of renal origin: Secondary | ICD-10-CM | POA: Diagnosis not present

## 2018-04-09 DIAGNOSIS — D509 Iron deficiency anemia, unspecified: Secondary | ICD-10-CM | POA: Diagnosis not present

## 2018-04-11 DIAGNOSIS — N186 End stage renal disease: Secondary | ICD-10-CM | POA: Diagnosis not present

## 2018-04-11 DIAGNOSIS — N2581 Secondary hyperparathyroidism of renal origin: Secondary | ICD-10-CM | POA: Diagnosis not present

## 2018-04-11 DIAGNOSIS — D631 Anemia in chronic kidney disease: Secondary | ICD-10-CM | POA: Diagnosis not present

## 2018-04-11 DIAGNOSIS — D509 Iron deficiency anemia, unspecified: Secondary | ICD-10-CM | POA: Diagnosis not present

## 2018-04-14 DIAGNOSIS — D631 Anemia in chronic kidney disease: Secondary | ICD-10-CM | POA: Diagnosis not present

## 2018-04-14 DIAGNOSIS — N2581 Secondary hyperparathyroidism of renal origin: Secondary | ICD-10-CM | POA: Diagnosis not present

## 2018-04-14 DIAGNOSIS — D509 Iron deficiency anemia, unspecified: Secondary | ICD-10-CM | POA: Diagnosis not present

## 2018-04-14 DIAGNOSIS — N186 End stage renal disease: Secondary | ICD-10-CM | POA: Diagnosis not present

## 2018-04-16 DIAGNOSIS — D631 Anemia in chronic kidney disease: Secondary | ICD-10-CM | POA: Diagnosis not present

## 2018-04-16 DIAGNOSIS — N186 End stage renal disease: Secondary | ICD-10-CM | POA: Diagnosis not present

## 2018-04-16 DIAGNOSIS — D509 Iron deficiency anemia, unspecified: Secondary | ICD-10-CM | POA: Diagnosis not present

## 2018-04-16 DIAGNOSIS — N2581 Secondary hyperparathyroidism of renal origin: Secondary | ICD-10-CM | POA: Diagnosis not present

## 2018-04-18 DIAGNOSIS — D509 Iron deficiency anemia, unspecified: Secondary | ICD-10-CM | POA: Diagnosis not present

## 2018-04-18 DIAGNOSIS — N2581 Secondary hyperparathyroidism of renal origin: Secondary | ICD-10-CM | POA: Diagnosis not present

## 2018-04-18 DIAGNOSIS — D631 Anemia in chronic kidney disease: Secondary | ICD-10-CM | POA: Diagnosis not present

## 2018-04-18 DIAGNOSIS — N186 End stage renal disease: Secondary | ICD-10-CM | POA: Diagnosis not present

## 2018-04-21 DIAGNOSIS — D631 Anemia in chronic kidney disease: Secondary | ICD-10-CM | POA: Diagnosis not present

## 2018-04-21 DIAGNOSIS — N2581 Secondary hyperparathyroidism of renal origin: Secondary | ICD-10-CM | POA: Diagnosis not present

## 2018-04-21 DIAGNOSIS — D509 Iron deficiency anemia, unspecified: Secondary | ICD-10-CM | POA: Diagnosis not present

## 2018-04-21 DIAGNOSIS — N186 End stage renal disease: Secondary | ICD-10-CM | POA: Diagnosis not present

## 2018-04-23 DIAGNOSIS — Z992 Dependence on renal dialysis: Secondary | ICD-10-CM | POA: Diagnosis not present

## 2018-04-23 DIAGNOSIS — N186 End stage renal disease: Secondary | ICD-10-CM | POA: Diagnosis not present

## 2018-04-23 DIAGNOSIS — T8249XA Other complication of vascular dialysis catheter, initial encounter: Secondary | ICD-10-CM | POA: Diagnosis not present

## 2018-04-23 DIAGNOSIS — T8612 Kidney transplant failure: Secondary | ICD-10-CM | POA: Diagnosis not present

## 2018-04-23 DIAGNOSIS — D631 Anemia in chronic kidney disease: Secondary | ICD-10-CM | POA: Diagnosis not present

## 2018-04-23 DIAGNOSIS — D689 Coagulation defect, unspecified: Secondary | ICD-10-CM | POA: Diagnosis not present

## 2018-04-24 DIAGNOSIS — R05 Cough: Secondary | ICD-10-CM | POA: Diagnosis not present

## 2018-04-24 DIAGNOSIS — R918 Other nonspecific abnormal finding of lung field: Secondary | ICD-10-CM | POA: Diagnosis not present

## 2018-04-24 DIAGNOSIS — J45909 Unspecified asthma, uncomplicated: Secondary | ICD-10-CM | POA: Diagnosis not present

## 2018-04-24 DIAGNOSIS — J4550 Severe persistent asthma, uncomplicated: Secondary | ICD-10-CM | POA: Diagnosis not present

## 2018-04-28 DIAGNOSIS — D689 Coagulation defect, unspecified: Secondary | ICD-10-CM | POA: Diagnosis not present

## 2018-04-28 DIAGNOSIS — D631 Anemia in chronic kidney disease: Secondary | ICD-10-CM | POA: Diagnosis not present

## 2018-04-28 DIAGNOSIS — T8249XA Other complication of vascular dialysis catheter, initial encounter: Secondary | ICD-10-CM | POA: Diagnosis not present

## 2018-04-28 DIAGNOSIS — N186 End stage renal disease: Secondary | ICD-10-CM | POA: Diagnosis not present

## 2018-04-30 DIAGNOSIS — T8249XA Other complication of vascular dialysis catheter, initial encounter: Secondary | ICD-10-CM | POA: Diagnosis not present

## 2018-04-30 DIAGNOSIS — D631 Anemia in chronic kidney disease: Secondary | ICD-10-CM | POA: Diagnosis not present

## 2018-04-30 DIAGNOSIS — D689 Coagulation defect, unspecified: Secondary | ICD-10-CM | POA: Diagnosis not present

## 2018-04-30 DIAGNOSIS — N186 End stage renal disease: Secondary | ICD-10-CM | POA: Diagnosis not present

## 2018-05-01 ENCOUNTER — Ambulatory Visit (HOSPITAL_COMMUNITY)
Admission: RE | Admit: 2018-05-01 | Discharge: 2018-05-01 | Disposition: A | Discharge status: Home / Self Care | Payer: Medicare Other | Source: Ambulatory Visit | Attending: Vascular Surgery | Admitting: Vascular Surgery

## 2018-05-01 ENCOUNTER — Other Ambulatory Visit: Payer: Self-pay | Admitting: *Deleted

## 2018-05-01 ENCOUNTER — Ambulatory Visit (INDEPENDENT_AMBULATORY_CARE_PROVIDER_SITE_OTHER)
Admission: RE | Admit: 2018-05-01 | Discharge: 2018-05-01 | Disposition: A | Discharge status: Home / Self Care | Payer: Medicare Other | Source: Ambulatory Visit | Attending: Vascular Surgery | Admitting: Vascular Surgery

## 2018-05-01 ENCOUNTER — Ambulatory Visit (INDEPENDENT_AMBULATORY_CARE_PROVIDER_SITE_OTHER): Payer: Medicare Other | Admitting: Vascular Surgery

## 2018-05-01 ENCOUNTER — Encounter: Payer: Self-pay | Admitting: Vascular Surgery

## 2018-05-01 ENCOUNTER — Encounter: Payer: Self-pay | Admitting: *Deleted

## 2018-05-01 ENCOUNTER — Other Ambulatory Visit: Payer: Self-pay

## 2018-05-01 VITALS — BP 151/98 | HR 63 | Temp 98.2°F | Resp 16 | Ht 65.0 in | Wt 129.0 lb

## 2018-05-01 DIAGNOSIS — Z01812 Encounter for preprocedural laboratory examination: Secondary | ICD-10-CM | POA: Diagnosis not present

## 2018-05-01 DIAGNOSIS — N186 End stage renal disease: Secondary | ICD-10-CM | POA: Diagnosis not present

## 2018-05-01 NOTE — Progress Notes (Signed)
Patient ID: Kimberly [name], female   DOB: Sep 14, 1956, 62 y.o.   MRN: [id number]  Reason for Consult: New Patient (Initial Visit) (ESRD.  Eval for perm access.  Dr. Jimmy Footman.  15 T, Th, S. )   Referred by [name], MD  Subjective:     HPI:  Kimberly [name] is a 61 y.o. female with history of IgA nephropathy previously had a failed transplant.  She was on peritoneal dialysis until she had peritonitis.  She is now transitioned to hemodialysis with the catheter in her right IJ.  She presents today for discussion of permanent access.  She does not take any blood thinners.  She is right-hand dominant.  Vein mapping arterial duplex performed prior to today's visit.  Past Medical History:  Diagnosis Date  . Anemia   . Asthma   . Diabetes mellitus    medication induced with  steroids  . Diverticulosis   . First degree AV block   . GERD (gastroesophageal reflux disease)   . Heart murmur    child  . History of chronic cough   . History of hiatal hernia   . History of hoarseness   . Hypertension    history; resolved on dialysis  . Hypothyroidism   . IgA nephropathy    on peritoneal dialysis  . Immunosuppression ([place])   . Kidney transplanted 01/2010   [facility], has had some rejection  . Patient on peritoneal dialysis ([place])   . Premature atrial beat   . Premature ventricular beat   . Renal transplant failure and rejection 2012  . Shingles 03/2011  . Stroke [facility]) October 2010, March 2013   cerebral aneurysm   Family History  Problem Relation Age of Onset  . Coronary artery disease Mother   . Emphysema Mother        smoked  . Ovarian cancer Mother   . Heart disease Mother   . Stroke Maternal Uncle   . Diabetes Maternal Uncle   . Kidney disease Maternal Uncle   . Liver disease Father    Past Surgical History:  Procedure Laterality Date  . CAPD INSERTION  08/25/2008   open, Dr [name]  . CAPD INSERTION N/A 12/14/2013   Procedure: LAPAROSCOPIC INSERTION CONTINUOUS  AMBULATORY PERITONEAL DIALYSIS  (CAPD) CATHETER;  Surgeon: [name], MD;  Location: [place];  Service: General;  Laterality: N/A;  . CAPD REMOVAL  03/22/2010   Dr [name]  . EYE SURGERY Bilateral    radial keratotomy  . INSERTION OF MESH N/A 12/14/2013   Procedure: INSERTION OF MESH;  Surgeon: [name], MD;  Location: [place];  Service: General;  Laterality: N/A;  . KIDNEY TRANSPLANT Right 01/2010   [place]  . LAPAROSCOPIC LYSIS OF ADHESIONS N/A 12/14/2013   Procedure: LAPAROSCOPIC LYSIS OF ADHESIONS;  Surgeon: [name], MD;  Location: [place];  Service: General;  Laterality: N/A;  . UMBILICAL HERNIA REPAIR  10/25/2009   open with mesh,  Dr [name]  . URETER REVISION  04/2011   [facility]  . VENTRAL HERNIA REPAIR N/A 12/14/2013   Procedure: LAPAROSCOPIC VENTRAL HERNIA;  Surgeon: [name], MD;  Location: [place];  Service: General;  Laterality: N/A;  . VIDEO ASSISTED THORACOSCOPY (VATS)/WEDGE RESECTION Left 09/01/2014   Procedure: VIDEO ASSISTED THORACOSCOPY (VATS)/WEDGE RESECTION;  Surgeon: [name], MD;  Location: [place];  Service: Thoracic;  Laterality: Left;  Marland Kitchen VIDEO BRONCHOSCOPY Bilateral 07/05/2014   Procedure: VIDEO BRONCHOSCOPY WITH FLUORO;  Surgeon: Christena Deem  Melvyn Novas, MD;  Location: [facility];  Service: Cardiopulmonary;  Laterality: Bilateral;    Short Social History:  Social History   Tobacco Use  . Smoking status: Never Smoker  . Smokeless tobacco: Never Used  Substance Use Topics  . Alcohol use: Yes    Comment: ocassional    Allergies  Allergen Reactions  . Other     Micera - Severe joint and muscle pain  . Dapsone Rash    Pain in feet  . Pregabalin Rash and Other (See Comments)    fever  . Sulfonamide Derivatives Rash  . Tramadol Rash    Rash on cheek    Current Outpatient Medications  Medication Sig Dispense Refill  . aspirin EC 325 MG tablet Take 1 tablet (325 mg total) by mouth daily. 30 tablet 0  . BREO ELLIPTA 200-25 MCG/INH AEPB  Inhale 1 puff into the lungs every evening.    . calcitRIOL (ROCALTROL) 0.5 MCG capsule Take 0.5 mcg by mouth 3 (three) times a week.     . chlorpheniramine (CHLOR-TRIMETON) 4 MG tablet Take 4 mg by mouth every 4 (four) hours as needed for allergies.    . Cholecalciferol (VITAMIN D3) 3000 UNITS TABS Take 3,000 Units by mouth daily.    . cinacalcet (SENSIPAR) 30 MG tablet Take 30 mg by mouth daily with breakfast.     . Digestive Enzymes (DIGESTIVE ENZYME PO) Take 1 capsule by mouth daily.    Marland Kitchen epoetin alfa (EPOGEN,PROCRIT) 42353 UNIT/ML injection Inject 10,000 Units into the skin every 30 (thirty) days.     . ferrous fumarate (HEMOCYTE - 106 MG FE) 325 (106 FE) MG TABS tablet Take 1 tablet by mouth daily.     Marland Kitchen gentamicin cream (GARAMYCIN) 0.1 % Apply 1 application topically daily.     Marland Kitchen lanthanum (FOSRENOL) 1000 MG chewable tablet Chew 1,000 mg by mouth daily.     Marland Kitchen levothyroxine (SYNTHROID, LEVOTHROID) 50 MCG tablet Take 50 mcg by mouth daily before breakfast. 70mcg on Sun, Mon, Wed, Fri and Sat. 56mcg on Tues and Thurs.    . metoCLOPramide (REGLAN) 5 MG tablet Take 5 mg by mouth daily as needed for nausea or vomiting.     . potassium chloride SA (K-DUR,KLOR-CON) 20 MEQ tablet Take 20 mEq by mouth daily.    Marland Kitchen SPIRIVA RESPIMAT 1.25 MCG/ACT AERS Inhale 2 puffs into the lungs daily.     No current facility-administered medications for this visit.     Review of Systems  Constitutional:  Constitutional negative. HENT: HENT negative.  Eyes: Eyes negative.  Respiratory: Positive for cough and shortness of breath.  Cardiovascular: Positive for dyspnea with exertion.  GI: Gastrointestinal negative.  Musculoskeletal: Musculoskeletal negative.  Skin: Skin negative.  Neurological: Neurological negative. Hematologic: Hematologic/lymphatic negative.  Psychiatric: Psychiatric negative.        Objective:  Objective   Vitals:   05/01/18 1053  BP: (!) 151/98  Pulse: 63  Resp: 16  Temp: 98.2  F (36.8 C)  TempSrc: Oral  SpO2: 96%  Weight: 129 lb (58.5 kg)  Height: 5\' 5"  (1.651 m)   Body mass index is 21.47 kg/m.  Physical Exam  Constitutional: She is oriented to person, place, and time. She appears well-developed.  HENT:  Head: Normocephalic.  Eyes: Pupils are equal, round, and reactive to light.  Neck: Normal range of motion.  Cardiovascular: Normal rate.  Pulses:      Radial pulses are 2+ on the right side, and 2+ on the left side.  Pulmonary/Chest: Effort normal.  Abdominal: Soft.  Musculoskeletal: Normal range of motion. She exhibits no edema.  Neurological: She is alert and oriented to person, place, and time.  Skin: Skin is warm and dry.  Psychiatric: She has a normal mood and affect. Her behavior is normal. Judgment and thought content normal.    Data: I have independently interpreted her bilateral upper extremity duplexes which are triphasic and on the right 0.43 cm and on the left 0.39 cm at the antecubital fossa.  I have also interpreted her bilateral upper extremity vein mapping which demonstrates a marginal left basilic vein no other vein appears reasonable for access creation.     Assessment/Plan:     62 year old female history of IgA nephropathy was previous on peritoneal dialysis now on hemo-in the interim.  She wants to go back to peritoneal the future but will need permanent access prior to this.  She is not interested in a fistula because of their appearance but I told her she should read more about it.  Either way she does not really have good veins for fistula creation so may not matter.  We will proceed with AV fistula or graft on the left in the near future but she is leaning towards wanting a graft.  We discussed the risk benefits and alternatives per we will get her scheduled today.     [name] MD [facility]

## 2018-05-02 DIAGNOSIS — N186 End stage renal disease: Secondary | ICD-10-CM | POA: Diagnosis not present

## 2018-05-02 DIAGNOSIS — D631 Anemia in chronic kidney disease: Secondary | ICD-10-CM | POA: Diagnosis not present

## 2018-05-02 DIAGNOSIS — T8249XA Other complication of vascular dialysis catheter, initial encounter: Secondary | ICD-10-CM | POA: Diagnosis not present

## 2018-05-02 DIAGNOSIS — D689 Coagulation defect, unspecified: Secondary | ICD-10-CM | POA: Diagnosis not present

## 2018-05-05 DIAGNOSIS — D631 Anemia in chronic kidney disease: Secondary | ICD-10-CM | POA: Diagnosis not present

## 2018-05-05 DIAGNOSIS — N189 Chronic kidney disease, unspecified: Secondary | ICD-10-CM | POA: Diagnosis not present

## 2018-05-05 DIAGNOSIS — I10 Essential (primary) hypertension: Secondary | ICD-10-CM | POA: Diagnosis not present

## 2018-05-05 DIAGNOSIS — T8249XA Other complication of vascular dialysis catheter, initial encounter: Secondary | ICD-10-CM | POA: Diagnosis not present

## 2018-05-05 DIAGNOSIS — N186 End stage renal disease: Secondary | ICD-10-CM | POA: Diagnosis not present

## 2018-05-05 DIAGNOSIS — E1122 Type 2 diabetes mellitus with diabetic chronic kidney disease: Secondary | ICD-10-CM | POA: Diagnosis not present

## 2018-05-05 DIAGNOSIS — D689 Coagulation defect, unspecified: Secondary | ICD-10-CM | POA: Diagnosis not present

## 2018-05-07 DIAGNOSIS — D689 Coagulation defect, unspecified: Secondary | ICD-10-CM | POA: Diagnosis not present

## 2018-05-07 DIAGNOSIS — N186 End stage renal disease: Secondary | ICD-10-CM | POA: Diagnosis not present

## 2018-05-07 DIAGNOSIS — D631 Anemia in chronic kidney disease: Secondary | ICD-10-CM | POA: Diagnosis not present

## 2018-05-07 DIAGNOSIS — T8249XA Other complication of vascular dialysis catheter, initial encounter: Secondary | ICD-10-CM | POA: Diagnosis not present

## 2018-05-09 DIAGNOSIS — N186 End stage renal disease: Secondary | ICD-10-CM | POA: Diagnosis not present

## 2018-05-09 DIAGNOSIS — D689 Coagulation defect, unspecified: Secondary | ICD-10-CM | POA: Diagnosis not present

## 2018-05-09 DIAGNOSIS — D631 Anemia in chronic kidney disease: Secondary | ICD-10-CM | POA: Diagnosis not present

## 2018-05-09 DIAGNOSIS — T8249XA Other complication of vascular dialysis catheter, initial encounter: Secondary | ICD-10-CM | POA: Diagnosis not present

## 2018-05-12 DIAGNOSIS — D689 Coagulation defect, unspecified: Secondary | ICD-10-CM | POA: Diagnosis not present

## 2018-05-12 DIAGNOSIS — T8249XA Other complication of vascular dialysis catheter, initial encounter: Secondary | ICD-10-CM | POA: Diagnosis not present

## 2018-05-12 DIAGNOSIS — N186 End stage renal disease: Secondary | ICD-10-CM | POA: Diagnosis not present

## 2018-05-12 DIAGNOSIS — D631 Anemia in chronic kidney disease: Secondary | ICD-10-CM | POA: Diagnosis not present

## 2018-05-14 DIAGNOSIS — D631 Anemia in chronic kidney disease: Secondary | ICD-10-CM | POA: Diagnosis not present

## 2018-05-14 DIAGNOSIS — T8249XA Other complication of vascular dialysis catheter, initial encounter: Secondary | ICD-10-CM | POA: Diagnosis not present

## 2018-05-14 DIAGNOSIS — D689 Coagulation defect, unspecified: Secondary | ICD-10-CM | POA: Diagnosis not present

## 2018-05-14 DIAGNOSIS — N186 End stage renal disease: Secondary | ICD-10-CM | POA: Diagnosis not present

## 2018-05-16 DIAGNOSIS — N186 End stage renal disease: Secondary | ICD-10-CM | POA: Diagnosis not present

## 2018-05-16 DIAGNOSIS — T8249XA Other complication of vascular dialysis catheter, initial encounter: Secondary | ICD-10-CM | POA: Diagnosis not present

## 2018-05-16 DIAGNOSIS — D689 Coagulation defect, unspecified: Secondary | ICD-10-CM | POA: Diagnosis not present

## 2018-05-16 DIAGNOSIS — D631 Anemia in chronic kidney disease: Secondary | ICD-10-CM | POA: Diagnosis not present

## 2018-05-19 ENCOUNTER — Encounter (HOSPITAL_COMMUNITY): Payer: Self-pay | Admitting: *Deleted

## 2018-05-19 ENCOUNTER — Other Ambulatory Visit: Payer: Self-pay

## 2018-05-19 DIAGNOSIS — D631 Anemia in chronic kidney disease: Secondary | ICD-10-CM | POA: Diagnosis not present

## 2018-05-19 DIAGNOSIS — T8249XA Other complication of vascular dialysis catheter, initial encounter: Secondary | ICD-10-CM | POA: Diagnosis not present

## 2018-05-19 DIAGNOSIS — N186 End stage renal disease: Secondary | ICD-10-CM | POA: Diagnosis not present

## 2018-05-19 DIAGNOSIS — D689 Coagulation defect, unspecified: Secondary | ICD-10-CM | POA: Diagnosis not present

## 2018-05-19 NOTE — Progress Notes (Signed)
Mrs Lerch denies chest pain or shortness of breath at rest. Patient was diagnosed with Sleep Apnea in April of this year at [facility] neurology," with everything that has been going on I have not followed up to get the CPAP."  Mrs Heine reports that she had had diarrhea for approxmately 1 month. Patient states that it is loose at times, watery at times, denies abdominal pain or fever. Patient has not seen a MD about diarrhea. I called Dr Claretha Cooper office and spoke with Zigmund Daniel and told her no fever or abdominal pain.  Zigmund Daniel reviewed history in care everywhere and read that patient had been seen in a ED in the past for diarrhea.  Zigmund Daniel said patient should follow up with her PCP.  PCP is Dr [name] at [facility]. I called Mrs Hereford back and encouraged her to follow up with PCP.

## 2018-05-20 ENCOUNTER — Ambulatory Visit (HOSPITAL_COMMUNITY)
Admission: RE | Admit: 2018-05-20 | Discharge: 2018-05-20 | Disposition: A | Discharge status: Home / Self Care | Payer: Medicare Other | Source: Ambulatory Visit | Attending: Vascular Surgery | Admitting: Vascular Surgery

## 2018-05-20 ENCOUNTER — Ambulatory Visit (HOSPITAL_COMMUNITY): Payer: Medicare Other | Admitting: Certified Registered"

## 2018-05-20 ENCOUNTER — Encounter (HOSPITAL_COMMUNITY)
Admission: RE | Disposition: A | Discharge status: Home / Self Care | Payer: Self-pay | Source: Ambulatory Visit | Attending: Vascular Surgery

## 2018-05-20 ENCOUNTER — Encounter (HOSPITAL_COMMUNITY): Payer: Self-pay | Admitting: Certified Registered Nurse Anesthetist

## 2018-05-20 DIAGNOSIS — K219 Gastro-esophageal reflux disease without esophagitis: Secondary | ICD-10-CM | POA: Diagnosis not present

## 2018-05-20 DIAGNOSIS — Z8673 Personal history of transient ischemic attack (TIA), and cerebral infarction without residual deficits: Secondary | ICD-10-CM | POA: Insufficient documentation

## 2018-05-20 DIAGNOSIS — I120 Hypertensive chronic kidney disease with stage 5 chronic kidney disease or end stage renal disease: Secondary | ICD-10-CM | POA: Insufficient documentation

## 2018-05-20 DIAGNOSIS — E039 Hypothyroidism, unspecified: Secondary | ICD-10-CM | POA: Diagnosis not present

## 2018-05-20 DIAGNOSIS — N186 End stage renal disease: Secondary | ICD-10-CM | POA: Diagnosis not present

## 2018-05-20 DIAGNOSIS — N185 Chronic kidney disease, stage 5: Secondary | ICD-10-CM | POA: Diagnosis not present

## 2018-05-20 DIAGNOSIS — E1122 Type 2 diabetes mellitus with diabetic chronic kidney disease: Secondary | ICD-10-CM | POA: Diagnosis not present

## 2018-05-20 DIAGNOSIS — D631 Anemia in chronic kidney disease: Secondary | ICD-10-CM | POA: Insufficient documentation

## 2018-05-20 HISTORY — DX: Personal history of other medical treatment: Z92.89

## 2018-05-20 HISTORY — DX: Adverse effect of unspecified anesthetic, initial encounter: T41.45XA

## 2018-05-20 HISTORY — DX: Pneumonia, unspecified organism: J18.9

## 2018-05-20 HISTORY — DX: Dyspnea, unspecified: R06.00

## 2018-05-20 HISTORY — PX: AV FISTULA PLACEMENT: SHX1204

## 2018-05-20 HISTORY — DX: Other complications of anesthesia, initial encounter: T88.59XA

## 2018-05-20 LAB — POCT I-STAT 4, (NA,K, GLUC, HGB,HCT)
GLUCOSE: 75 mg/dL (ref 70–99)
HCT: 38 % (ref 36.0–46.0)
HEMOGLOBIN: 12.9 g/dL (ref 12.0–15.0)
Potassium: 3.4 mmol/L — ABNORMAL LOW (ref 3.5–5.1)
Sodium: 136 mmol/L (ref 135–145)

## 2018-05-20 LAB — GLUCOSE, CAPILLARY
GLUCOSE-CAPILLARY: 72 mg/dL (ref 70–99)
Glucose-Capillary: 76 mg/dL (ref 70–99)

## 2018-05-20 SURGERY — INSERTION OF ARTERIOVENOUS (AV) GORE-TEX GRAFT ARM
Anesthesia: Monitor Anesthesia Care | Site: Arm Upper | Laterality: Left

## 2018-05-20 MED ORDER — GLYCOPYRROLATE 0.2 MG/ML IJ SOLN
INTRAMUSCULAR | Status: DC | PRN
  Administered 2018-05-20: 0.2 mg via INTRAVENOUS

## 2018-05-20 MED ORDER — MIDAZOLAM HCL 5 MG/5ML IJ SOLN
INTRAMUSCULAR | Status: DC | PRN
  Administered 2018-05-20: 2 mg via INTRAVENOUS

## 2018-05-20 MED ORDER — SODIUM CHLORIDE 0.9 % IV SOLN
INTRAVENOUS | Status: DC | PRN
  Administered 2018-05-20: 500 mL

## 2018-05-20 MED ORDER — PROPOFOL 10 MG/ML IV BOLUS
INTRAVENOUS | Status: DC | PRN
  Administered 2018-05-20: 20 mg via INTRAVENOUS
  Administered 2018-05-20: 40 mg via INTRAVENOUS

## 2018-05-20 MED ORDER — HEPARIN SODIUM (PORCINE) 1000 UNIT/ML IJ SOLN
INTRAMUSCULAR | Status: AC
  Filled 2018-05-20: qty 2

## 2018-05-20 MED ORDER — LIDOCAINE-EPINEPHRINE (PF) 1 %-1:200000 IJ SOLN
INTRAMUSCULAR | Status: DC | PRN
  Administered 2018-05-20: 20 mL

## 2018-05-20 MED ORDER — OXYCODONE-ACETAMINOPHEN 5-325 MG PO TABS: 1.0000 | ORAL_TABLET | Freq: Four times a day (QID) | ORAL | 0 refills | Status: DC | PRN

## 2018-05-20 MED ORDER — FENTANYL CITRATE (PF) 250 MCG/5ML IJ SOLN
INTRAMUSCULAR | Status: AC
  Filled 2018-05-20: qty 5

## 2018-05-20 MED ORDER — CEFAZOLIN SODIUM 1 G IJ SOLR
INTRAMUSCULAR | Status: AC
  Filled 2018-05-20: qty 20

## 2018-05-20 MED ORDER — FENTANYL CITRATE (PF) 100 MCG/2ML IJ SOLN: 25.0000 ug | INTRAMUSCULAR | Status: DC | PRN

## 2018-05-20 MED ORDER — SODIUM CHLORIDE 0.9 % IV SOLN
INTRAVENOUS | Status: DC | PRN
  Administered 2018-05-20: 20 ug/min via INTRAVENOUS

## 2018-05-20 MED ORDER — PROPOFOL 500 MG/50ML IV EMUL
INTRAVENOUS | Status: DC | PRN
  Administered 2018-05-20: 150 ug/kg/min via INTRAVENOUS

## 2018-05-20 MED ORDER — LIDOCAINE-EPINEPHRINE (PF) 1 %-1:200000 IJ SOLN
INTRAMUSCULAR | Status: AC
  Filled 2018-05-20: qty 30

## 2018-05-20 MED ORDER — PROMETHAZINE HCL 25 MG/ML IJ SOLN: 6.2500 mg | INTRAMUSCULAR | Status: DC | PRN

## 2018-05-20 MED ORDER — FENTANYL CITRATE (PF) 250 MCG/5ML IJ SOLN
INTRAMUSCULAR | Status: DC | PRN
  Administered 2018-05-20 (×3): 25 ug via INTRAVENOUS
  Administered 2018-05-20: 50 ug via INTRAVENOUS

## 2018-05-20 MED ORDER — SODIUM CHLORIDE 0.9 % IV SOLN
INTRAVENOUS | Status: DC
  Administered 2018-05-20: 09:00:00 via INTRAVENOUS

## 2018-05-20 MED ORDER — 0.9 % SODIUM CHLORIDE (POUR BTL) OPTIME
TOPICAL | Status: DC | PRN
  Administered 2018-05-20: 1000 mL

## 2018-05-20 MED ORDER — ONDANSETRON HCL 4 MG/2ML IJ SOLN
INTRAMUSCULAR | Status: DC | PRN
  Administered 2018-05-20: 4 mg via INTRAVENOUS

## 2018-05-20 MED ORDER — LIDOCAINE 2% (20 MG/ML) 5 ML SYRINGE
INTRAMUSCULAR | Status: DC | PRN
  Administered 2018-05-20: 40 mg via INTRAVENOUS

## 2018-05-20 MED ORDER — CEFAZOLIN SODIUM-DEXTROSE 2-4 GM/100ML-% IV SOLN
2.0000 g | INTRAVENOUS | Status: AC
  Administered 2018-05-20: 2 g via INTRAVENOUS
  Filled 2018-05-20 (×2): qty 100

## 2018-05-20 MED ORDER — SODIUM CHLORIDE 0.9 % IV SOLN
INTRAVENOUS | Status: AC
  Filled 2018-05-20: qty 1.2

## 2018-05-20 MED ORDER — PROTAMINE SULFATE 10 MG/ML IV SOLN
INTRAVENOUS | Status: AC
  Filled 2018-05-20: qty 5

## 2018-05-20 SURGICAL SUPPLY — 40 items
ADH SKN CLS APL DERMABOND .7 (GAUZE/BANDAGES/DRESSINGS) ×2
ARMBAND PINK RESTRICT EXTREMIT (MISCELLANEOUS) ×3 IMPLANT
CANISTER SUCT 3000ML PPV (MISCELLANEOUS) ×2 IMPLANT
CLIP VESOCCLUDE MED 6/CT (CLIP) ×2 IMPLANT
CLIP VESOCCLUDE SM WIDE 6/CT (CLIP) ×2 IMPLANT
DERMABOND ADVANCED (GAUZE/BANDAGES/DRESSINGS) ×2
DERMABOND ADVANCED .7 DNX12 (GAUZE/BANDAGES/DRESSINGS) ×1 IMPLANT
ELECT REM PT RETURN 9FT ADLT (ELECTROSURGICAL) ×2
ELECTRODE REM PT RTRN 9FT ADLT (ELECTROSURGICAL) ×1 IMPLANT
GLOVE BIO SURGEON STRL SZ 6.5 (GLOVE) ×1 IMPLANT
GLOVE BIO SURGEON STRL SZ7 (GLOVE) ×1 IMPLANT
GLOVE BIO SURGEON STRL SZ7.5 (GLOVE) ×2 IMPLANT
GLOVE BIOGEL PI IND STRL 6.5 (GLOVE) IMPLANT
GLOVE BIOGEL PI IND STRL 7.0 (GLOVE) IMPLANT
GLOVE BIOGEL PI INDICATOR 6.5 (GLOVE) ×1
GLOVE BIOGEL PI INDICATOR 7.0 (GLOVE) ×1
GOWN STRL REUS W/ TWL LRG LVL3 (GOWN DISPOSABLE) ×2 IMPLANT
GOWN STRL REUS W/ TWL XL LVL3 (GOWN DISPOSABLE) ×1 IMPLANT
GOWN STRL REUS W/TWL LRG LVL3 (GOWN DISPOSABLE) ×4
GOWN STRL REUS W/TWL XL LVL3 (GOWN DISPOSABLE) ×2
GRAFT GORETEX STRT 4-7X45 (Vascular Products) ×1 IMPLANT
HEMOSTAT SNOW SURGICEL 2X4 (HEMOSTASIS) IMPLANT
INSERT FOGARTY SM (MISCELLANEOUS) ×2 IMPLANT
KIT BASIN OR (CUSTOM PROCEDURE TRAY) ×2 IMPLANT
KIT TURNOVER KIT B (KITS) ×2 IMPLANT
NS IRRIG 1000ML POUR BTL (IV SOLUTION) ×2 IMPLANT
PACK CV ACCESS (CUSTOM PROCEDURE TRAY) ×2 IMPLANT
PAD ARMBOARD 7.5X6 YLW CONV (MISCELLANEOUS) ×4 IMPLANT
SUT GORETEX 6.0 TH-9 30 IN (SUTURE) IMPLANT
SUT GORETEX CV-6TTC-13 36IN (SUTURE) IMPLANT
SUT MNCRL AB 4-0 PS2 18 (SUTURE) IMPLANT
SUT PROLENE 5 0 C 1 24 (SUTURE) ×1 IMPLANT
SUT PROLENE 6 0 BV (SUTURE) IMPLANT
SUT SILK 2 0 SH (SUTURE) IMPLANT
SUT VIC AB 3-0 SH 27 (SUTURE) ×6
SUT VIC AB 3-0 SH 27X BRD (SUTURE) ×2 IMPLANT
SYR TOOMEY 50ML (SYRINGE) IMPLANT
TOWEL GREEN STERILE (TOWEL DISPOSABLE) ×2 IMPLANT
UNDERPAD 30X30 (UNDERPADS AND DIAPERS) ×2 IMPLANT
WATER STERILE IRR 1000ML POUR (IV SOLUTION) ×2 IMPLANT

## 2018-05-20 NOTE — Anesthesia Postprocedure Evaluation (Signed)
Anesthesia Post Note  Patient: Kimberly [name]  Procedure(s) Performed: INSERTION OF ARTERIOVENOUS (AV) GORE-TEX GRAFT LEFT  ARM (Left Arm Upper)     Patient location during evaluation: PACU Anesthesia Type: MAC Level of consciousness: awake and alert Pain management: pain level controlled Vital Signs Assessment: post-procedure vital signs reviewed and stable Respiratory status: spontaneous breathing and respiratory function stable Cardiovascular status: stable Postop Assessment: no apparent nausea or vomiting Anesthetic complications: no    Last Vitals:  Vitals:   05/20/18 1305 05/20/18 1320  BP: (!) 158/77 (!) 163/76  Pulse: 75 73  Resp: 10 (!) 9  Temp:  (!) 36.3 C  SpO2: 97% 97%    Last Pain:  Vitals:   05/20/18 0819  TempSrc: Oral                 Matej Sappenfield DANIEL

## 2018-05-20 NOTE — H&P (Signed)
   History and Physical Update  The patient was interviewed and re-examined.  The patient's previous History and Physical has been reviewed and is unchanged from recent office visit. Plan for left arm avg.   Jeneva Schweizer C. Donzetta Matters, MD [facility] [place] Office: [phone] Pager: [phone]   05/20/2018, 8:20 AM

## 2018-05-20 NOTE — Op Note (Signed)
    Patient name: Kimberly [name] MRN: [id number] DOB: Feb 11, 1956 Sex: female  05/20/2018 Pre-operative Diagnosis: end-stage renal disease Post-operative diagnosis:  Same Surgeon:  Erlene Quan C. Donzetta Matters, MD Assistant: Claiborne Rigg, PA Procedure Performed: Left arm and brachial artery to axillary vein arteriovenous graft with 4-7 millimeter PTFE  Indications:  62 year old female with history of end-stage renal disease from IG nephropathy. She has been on her hemopdialysis in the past now has a catheter in place as indicated for hemodialysis. She is adamant that she does not want placement of a fistula she also has suboptimal vein and so we have planned for right upper arm AV grafting.  Findings: axillary vein was nearly 1 cm diameter. Brachial artery was 3 mm in diameter at completion of her overall pain and pulse the radial artery was not palpable but there is a strong signal did augment with compression of the graft.   Procedure:  The patient was identified in the holding area and taken to the operating room where she was placed supine operative 1 Mac anesthesia was induced. She was sterilely prepped and draped over the extremity given antibiotics and a timeout was called. We began by anesthetizing the upper arm with 1% lidocaine with epinephrine. Total 20 mL. We then made an incision overlying the palpable x-ray pulse dissected down the far axillary vein placed a vessel loop around this. We then made a counterincision just above the antecubital dissector brachial artery placed a vessel loop around this and then tunneled with a curved color between the 2 incisions. A 4-7 millimeter graft was tunneled between the 2 flushed easily. It was trimmed to size the axillary vein was clamped 2 areas opened longitudinally and the graft was then inset with 5-0 Prolene suture. We then released the clamps for shoulder graft and reclamped the graft. The artery was then clamped distally impression open longitudinally and  flushed with heparinized saline both directions.the graft was ithen sewn end to side with 6-0 Prolene suture.prior to the completion of this anastomosis we allowed flushing in all directions. Upon completion there was a strong thrill the graft and a radial artery signal at the wrist as demonstrated above. We then obtained hemostasis. Her wounds and closed in 2 layers with Vicryl and Monocryl. Dermabond was placed at the level of the skin. She was allowed to awaken from anesthesia having tolerated the procedure well without immediate complication. All counts were correct at completion.  EBL: 10 mL.   Shaneece Stockburger C. Donzetta Matters, MD [facility] Office: [phone] Pager: [phone]

## 2018-05-20 NOTE — Discharge Instructions (Addendum)
° °  [facility]  Discharge Instructions  AV Fistula or Graft Surgery for Dialysis Access  Please refer to the following instructions for your post-procedure care. Your surgeon or physician assistant will discuss any changes with you.  Activity  You may drive the day following your surgery, if you are comfortable and no longer taking prescription pain medication. Resume full activity as the soreness in your incision resolves.  Bathing/Showering  You may shower after you go home. Keep your incision dry for 48 hours. Do not soak in a bathtub, hot tub, or swim until the incision heals completely. You may not shower if you have a hemodialysis catheter.  Incision Care  Clean your incision with mild soap and water after 48 hours. Pat the area dry with a clean towel. You do not need a bandage unless otherwise instructed. Do not apply any ointments or creams to your incision. You may have skin glue on your incision. Do not peel it off. It will come off on its own in about one week. Your arm may swell a bit after surgery. To reduce swelling use pillows to elevate your arm so it is above your heart. Your doctor will tell you if you need to lightly wrap your arm with an ACE bandage.  Diet  Resume your normal diet. There are not special food restrictions following this procedure. In order to heal from your surgery, it is CRITICAL to get adequate nutrition. Your body requires vitamins, minerals, and protein. Vegetables are the best source of vitamins and minerals. Vegetables also provide the perfect balance of protein. Processed food has little nutritional value, so try to avoid this.  Medications  Resume taking all of your medications. If your incision is causing pain, you may take over-the counter pain relievers such as acetaminophen (Tylenol). If you were prescribed a stronger pain medication, please be aware these medications can cause nausea and constipation. Prevent  nausea by taking the medication with a snack or meal. Avoid constipation by drinking plenty of fluids and eating foods with high amount of fiber, such as fruits, vegetables, and grains.  Do not take Tylenol if you are taking prescription pain medications.  Follow up Your surgeon may want to see you in the office following your access surgery. If so, this will be arranged at the time of your surgery.  Please call us immediately for any of the following conditions:  Increased pain, redness, drainage (pus) from your incision site Fever of 101 degrees or higher Severe or worsening pain at your incision site Hand pain or numbness.  Reduce your risk of vascular disease:  Stop smoking. If you would like help, call [facility] at 1-800-QUIT-NOW (786) 729-7199) or [facility] at [place] your cholesterol Maintain a desired weight Control your diabetes Keep your blood pressure down  Dialysis  It will take several weeks to several months for your new dialysis access to be ready for use. Your surgeon will determine when it is okay to use it. Your nephrologist will continue to direct your dialysis. You can continue to use your Permcath until your new access is ready for use.   05/20/2018 Kimberly [name] [id number] 12-03-1955  Surgeon(s): [name], MD  Procedure(s): INSERTION OF ARTERIOVENOUS (AV) GORE-TEX GRAFT LEFT  ARM  x Do not stick graft for 4 weeks    If you have any questions, please call the office at [phone].

## 2018-05-20 NOTE — Anesthesia Preprocedure Evaluation (Addendum)
Anesthesia Evaluation  Patient identified by MRN, date of birth, ID band Patient awake    Reviewed: Allergy & Precautions, H&P , NPO status , Patient's Chart, lab work & pertinent test results  History of Anesthesia Complications Negative for: history of anesthetic complications  Airway Mallampati: I  TM Distance: >3 FB Neck ROM: Full    Dental  (+) Teeth Intact, Dental Advisory Given   Pulmonary neg sleep apnea, neg COPD, neg recent URI,    breath sounds clear to auscultation       Cardiovascular hypertension, (-) angina(-) CAD, (-) Past MI and (-) CHF + dysrhythmias  Rhythm:Regular     Neuro/Psych neg Seizures CVA, No Residual Symptoms negative psych ROS   GI/Hepatic Neg liver ROS, hiatal hernia, GERD  ,  Endo/Other  diabetes, Type 2Hypothyroidism   Renal/GU CRF and DialysisRenal disease     Musculoskeletal   Abdominal   Peds  Hematology  (+) anemia ,   Anesthesia Other Findings   Reproductive/Obstetrics                             Anesthesia Physical  Anesthesia Plan  ASA: III  Anesthesia Plan: MAC   Post-op Pain Management:    Induction: Intravenous  PONV Risk Score and Plan: 2 and Ondansetron and Propofol infusion  Airway Management Planned: Natural Airway and Nasal Cannula  Additional Equipment: Arterial line  Intra-op Plan:   Post-operative Plan:   Informed Consent: I have reviewed the patients History and Physical, chart, labs and discussed the procedure including the risks, benefits and alternatives for the proposed anesthesia with the patient or authorized representative who has indicated his/her understanding and acceptance.   Dental advisory given  Plan Discussed with: CRNA, Anesthesiologist and Surgeon  Anesthesia Plan Comments:        Anesthesia Quick Evaluation

## 2018-05-20 NOTE — Transfer of Care (Signed)
Immediate Anesthesia Transfer of Care Note  Patient: Kimberly [name]  Procedure(s) Performed: INSERTION OF ARTERIOVENOUS (AV) GORE-TEX GRAFT LEFT  ARM (Left Arm Upper)  Patient Location: PACU  Anesthesia Type:MAC  Level of Consciousness: awake, alert , oriented and patient cooperative  Airway & Oxygen Therapy: Patient connected to face mask oxygen  Post-op Assessment: Report given to RN and Post -op Vital signs reviewed and stable  Post vital signs: Reviewed and stable  Last Vitals:  Vitals Value Taken Time  BP 141/62 05/20/2018 12:37 PM  Temp    Pulse 71 05/20/2018 12:40 PM  Resp 12 05/20/2018 12:40 PM  SpO2 97 % 05/20/2018 12:40 PM  Vitals shown include unvalidated device data.  Last Pain:  Vitals:   05/20/18 0819  TempSrc: Oral         Complications: No apparent anesthesia complications

## 2018-05-21 ENCOUNTER — Encounter (HOSPITAL_COMMUNITY): Payer: Self-pay | Admitting: Vascular Surgery

## 2018-05-21 DIAGNOSIS — N186 End stage renal disease: Secondary | ICD-10-CM | POA: Diagnosis not present

## 2018-05-22 DIAGNOSIS — R918 Other nonspecific abnormal finding of lung field: Secondary | ICD-10-CM | POA: Diagnosis not present

## 2018-05-23 DIAGNOSIS — N186 End stage renal disease: Secondary | ICD-10-CM | POA: Diagnosis not present

## 2018-05-26 DIAGNOSIS — N186 End stage renal disease: Secondary | ICD-10-CM | POA: Diagnosis not present

## 2018-05-26 DIAGNOSIS — T8249XA Other complication of vascular dialysis catheter, initial encounter: Secondary | ICD-10-CM | POA: Diagnosis not present

## 2018-05-26 DIAGNOSIS — D689 Coagulation defect, unspecified: Secondary | ICD-10-CM | POA: Diagnosis not present

## 2018-05-26 DIAGNOSIS — D631 Anemia in chronic kidney disease: Secondary | ICD-10-CM | POA: Diagnosis not present

## 2018-05-28 DIAGNOSIS — D689 Coagulation defect, unspecified: Secondary | ICD-10-CM | POA: Diagnosis not present

## 2018-05-28 DIAGNOSIS — N186 End stage renal disease: Secondary | ICD-10-CM | POA: Diagnosis not present

## 2018-05-28 DIAGNOSIS — N2581 Secondary hyperparathyroidism of renal origin: Secondary | ICD-10-CM | POA: Diagnosis not present

## 2018-05-28 DIAGNOSIS — T8249XA Other complication of vascular dialysis catheter, initial encounter: Secondary | ICD-10-CM | POA: Diagnosis not present

## 2018-05-28 DIAGNOSIS — R51 Headache: Secondary | ICD-10-CM | POA: Diagnosis not present

## 2018-05-28 DIAGNOSIS — Z111 Encounter for screening for respiratory tuberculosis: Secondary | ICD-10-CM | POA: Diagnosis not present

## 2018-05-28 DIAGNOSIS — D631 Anemia in chronic kidney disease: Secondary | ICD-10-CM | POA: Diagnosis not present

## 2018-05-30 DIAGNOSIS — D689 Coagulation defect, unspecified: Secondary | ICD-10-CM | POA: Diagnosis not present

## 2018-05-30 DIAGNOSIS — D631 Anemia in chronic kidney disease: Secondary | ICD-10-CM | POA: Diagnosis not present

## 2018-05-30 DIAGNOSIS — T8249XA Other complication of vascular dialysis catheter, initial encounter: Secondary | ICD-10-CM | POA: Diagnosis not present

## 2018-05-30 DIAGNOSIS — N186 End stage renal disease: Secondary | ICD-10-CM | POA: Diagnosis not present

## 2018-06-02 DIAGNOSIS — D689 Coagulation defect, unspecified: Secondary | ICD-10-CM | POA: Diagnosis not present

## 2018-06-02 DIAGNOSIS — T8249XA Other complication of vascular dialysis catheter, initial encounter: Secondary | ICD-10-CM | POA: Diagnosis not present

## 2018-06-02 DIAGNOSIS — N186 End stage renal disease: Secondary | ICD-10-CM | POA: Diagnosis not present

## 2018-06-02 DIAGNOSIS — D631 Anemia in chronic kidney disease: Secondary | ICD-10-CM | POA: Diagnosis not present

## 2018-06-04 DIAGNOSIS — D689 Coagulation defect, unspecified: Secondary | ICD-10-CM | POA: Diagnosis not present

## 2018-06-04 DIAGNOSIS — J4550 Severe persistent asthma, uncomplicated: Secondary | ICD-10-CM | POA: Diagnosis not present

## 2018-06-04 DIAGNOSIS — T8249XA Other complication of vascular dialysis catheter, initial encounter: Secondary | ICD-10-CM | POA: Diagnosis not present

## 2018-06-04 DIAGNOSIS — J8410 Pulmonary fibrosis, unspecified: Secondary | ICD-10-CM | POA: Diagnosis not present

## 2018-06-04 DIAGNOSIS — C7A090 Malignant carcinoid tumor of the bronchus and lung: Secondary | ICD-10-CM | POA: Diagnosis not present

## 2018-06-04 DIAGNOSIS — D631 Anemia in chronic kidney disease: Secondary | ICD-10-CM | POA: Diagnosis not present

## 2018-06-04 DIAGNOSIS — N186 End stage renal disease: Secondary | ICD-10-CM | POA: Diagnosis not present

## 2018-06-06 DIAGNOSIS — D689 Coagulation defect, unspecified: Secondary | ICD-10-CM | POA: Diagnosis not present

## 2018-06-06 DIAGNOSIS — N186 End stage renal disease: Secondary | ICD-10-CM | POA: Diagnosis not present

## 2018-06-06 DIAGNOSIS — T8249XA Other complication of vascular dialysis catheter, initial encounter: Secondary | ICD-10-CM | POA: Diagnosis not present

## 2018-06-06 DIAGNOSIS — D631 Anemia in chronic kidney disease: Secondary | ICD-10-CM | POA: Diagnosis not present

## 2018-06-09 DIAGNOSIS — D631 Anemia in chronic kidney disease: Secondary | ICD-10-CM | POA: Diagnosis not present

## 2018-06-09 DIAGNOSIS — D689 Coagulation defect, unspecified: Secondary | ICD-10-CM | POA: Diagnosis not present

## 2018-06-09 DIAGNOSIS — N186 End stage renal disease: Secondary | ICD-10-CM | POA: Diagnosis not present

## 2018-06-09 DIAGNOSIS — T8249XA Other complication of vascular dialysis catheter, initial encounter: Secondary | ICD-10-CM | POA: Diagnosis not present

## 2018-06-11 DIAGNOSIS — N186 End stage renal disease: Secondary | ICD-10-CM | POA: Diagnosis not present

## 2018-06-11 DIAGNOSIS — D689 Coagulation defect, unspecified: Secondary | ICD-10-CM | POA: Diagnosis not present

## 2018-06-11 DIAGNOSIS — D631 Anemia in chronic kidney disease: Secondary | ICD-10-CM | POA: Diagnosis not present

## 2018-06-11 DIAGNOSIS — T8249XA Other complication of vascular dialysis catheter, initial encounter: Secondary | ICD-10-CM | POA: Diagnosis not present

## 2018-06-13 DIAGNOSIS — T8249XA Other complication of vascular dialysis catheter, initial encounter: Secondary | ICD-10-CM | POA: Diagnosis not present

## 2018-06-13 DIAGNOSIS — D689 Coagulation defect, unspecified: Secondary | ICD-10-CM | POA: Diagnosis not present

## 2018-06-13 DIAGNOSIS — N186 End stage renal disease: Secondary | ICD-10-CM | POA: Diagnosis not present

## 2018-06-13 DIAGNOSIS — D631 Anemia in chronic kidney disease: Secondary | ICD-10-CM | POA: Diagnosis not present

## 2018-06-15 DIAGNOSIS — I6389 Other cerebral infarction: Secondary | ICD-10-CM | POA: Diagnosis not present

## 2018-06-16 DIAGNOSIS — D689 Coagulation defect, unspecified: Secondary | ICD-10-CM | POA: Diagnosis not present

## 2018-06-16 DIAGNOSIS — D631 Anemia in chronic kidney disease: Secondary | ICD-10-CM | POA: Diagnosis not present

## 2018-06-16 DIAGNOSIS — T8249XA Other complication of vascular dialysis catheter, initial encounter: Secondary | ICD-10-CM | POA: Diagnosis not present

## 2018-06-16 DIAGNOSIS — N186 End stage renal disease: Secondary | ICD-10-CM | POA: Diagnosis not present

## 2018-06-18 DIAGNOSIS — D689 Coagulation defect, unspecified: Secondary | ICD-10-CM | POA: Diagnosis not present

## 2018-06-18 DIAGNOSIS — T8249XA Other complication of vascular dialysis catheter, initial encounter: Secondary | ICD-10-CM | POA: Diagnosis not present

## 2018-06-18 DIAGNOSIS — N186 End stage renal disease: Secondary | ICD-10-CM | POA: Diagnosis not present

## 2018-06-18 DIAGNOSIS — D631 Anemia in chronic kidney disease: Secondary | ICD-10-CM | POA: Diagnosis not present

## 2018-06-20 DIAGNOSIS — N186 End stage renal disease: Secondary | ICD-10-CM | POA: Diagnosis not present

## 2018-06-22 DIAGNOSIS — Z7951 Long term (current) use of inhaled steroids: Secondary | ICD-10-CM | POA: Diagnosis not present

## 2018-06-22 DIAGNOSIS — Z882 Allergy status to sulfonamides status: Secondary | ICD-10-CM | POA: Diagnosis not present

## 2018-06-22 DIAGNOSIS — R918 Other nonspecific abnormal finding of lung field: Secondary | ICD-10-CM | POA: Diagnosis not present

## 2018-06-22 DIAGNOSIS — Z8249 Family history of ischemic heart disease and other diseases of the circulatory system: Secondary | ICD-10-CM | POA: Diagnosis not present

## 2018-06-22 DIAGNOSIS — Z87440 Personal history of urinary (tract) infections: Secondary | ICD-10-CM | POA: Diagnosis not present

## 2018-06-22 DIAGNOSIS — J9809 Other diseases of bronchus, not elsewhere classified: Secondary | ICD-10-CM | POA: Diagnosis not present

## 2018-06-22 DIAGNOSIS — E039 Hypothyroidism, unspecified: Secondary | ICD-10-CM | POA: Diagnosis not present

## 2018-06-22 DIAGNOSIS — N186 End stage renal disease: Secondary | ICD-10-CM | POA: Diagnosis not present

## 2018-06-22 DIAGNOSIS — N185 Chronic kidney disease, stage 5: Secondary | ICD-10-CM | POA: Diagnosis not present

## 2018-06-22 DIAGNOSIS — Z992 Dependence on renal dialysis: Secondary | ICD-10-CM | POA: Diagnosis not present

## 2018-06-22 DIAGNOSIS — T8612 Kidney transplant failure: Secondary | ICD-10-CM | POA: Diagnosis not present

## 2018-06-22 DIAGNOSIS — J45909 Unspecified asthma, uncomplicated: Secondary | ICD-10-CM | POA: Diagnosis not present

## 2018-06-22 DIAGNOSIS — K219 Gastro-esophageal reflux disease without esophagitis: Secondary | ICD-10-CM | POA: Diagnosis not present

## 2018-06-22 DIAGNOSIS — Z8673 Personal history of transient ischemic attack (TIA), and cerebral infarction without residual deficits: Secondary | ICD-10-CM | POA: Diagnosis not present

## 2018-06-22 DIAGNOSIS — D631 Anemia in chronic kidney disease: Secondary | ICD-10-CM | POA: Diagnosis not present

## 2018-06-22 DIAGNOSIS — Z940 Kidney transplant status: Secondary | ICD-10-CM | POA: Diagnosis not present

## 2018-06-22 DIAGNOSIS — Z888 Allergy status to other drugs, medicaments and biological substances status: Secondary | ICD-10-CM | POA: Diagnosis not present

## 2018-06-22 DIAGNOSIS — Z79899 Other long term (current) drug therapy: Secondary | ICD-10-CM | POA: Diagnosis not present

## 2018-06-22 DIAGNOSIS — Z7982 Long term (current) use of aspirin: Secondary | ICD-10-CM | POA: Diagnosis not present

## 2018-06-23 DIAGNOSIS — D631 Anemia in chronic kidney disease: Secondary | ICD-10-CM | POA: Diagnosis not present

## 2018-06-23 DIAGNOSIS — D689 Coagulation defect, unspecified: Secondary | ICD-10-CM | POA: Diagnosis not present

## 2018-06-23 DIAGNOSIS — N186 End stage renal disease: Secondary | ICD-10-CM | POA: Diagnosis not present

## 2018-06-23 DIAGNOSIS — T8249XA Other complication of vascular dialysis catheter, initial encounter: Secondary | ICD-10-CM | POA: Diagnosis not present

## 2018-06-23 DIAGNOSIS — N2581 Secondary hyperparathyroidism of renal origin: Secondary | ICD-10-CM | POA: Diagnosis not present

## 2018-06-25 DIAGNOSIS — D631 Anemia in chronic kidney disease: Secondary | ICD-10-CM | POA: Diagnosis not present

## 2018-06-25 DIAGNOSIS — D689 Coagulation defect, unspecified: Secondary | ICD-10-CM | POA: Diagnosis not present

## 2018-06-25 DIAGNOSIS — T8249XA Other complication of vascular dialysis catheter, initial encounter: Secondary | ICD-10-CM | POA: Diagnosis not present

## 2018-06-25 DIAGNOSIS — N2581 Secondary hyperparathyroidism of renal origin: Secondary | ICD-10-CM | POA: Diagnosis not present

## 2018-06-25 DIAGNOSIS — N186 End stage renal disease: Secondary | ICD-10-CM | POA: Diagnosis not present

## 2018-06-27 DIAGNOSIS — N2581 Secondary hyperparathyroidism of renal origin: Secondary | ICD-10-CM | POA: Diagnosis not present

## 2018-06-27 DIAGNOSIS — D689 Coagulation defect, unspecified: Secondary | ICD-10-CM | POA: Diagnosis not present

## 2018-06-27 DIAGNOSIS — T8249XA Other complication of vascular dialysis catheter, initial encounter: Secondary | ICD-10-CM | POA: Diagnosis not present

## 2018-06-27 DIAGNOSIS — D631 Anemia in chronic kidney disease: Secondary | ICD-10-CM | POA: Diagnosis not present

## 2018-06-27 DIAGNOSIS — N186 End stage renal disease: Secondary | ICD-10-CM | POA: Diagnosis not present

## 2018-06-30 DIAGNOSIS — T8249XA Other complication of vascular dialysis catheter, initial encounter: Secondary | ICD-10-CM | POA: Diagnosis not present

## 2018-06-30 DIAGNOSIS — D689 Coagulation defect, unspecified: Secondary | ICD-10-CM | POA: Diagnosis not present

## 2018-06-30 DIAGNOSIS — N186 End stage renal disease: Secondary | ICD-10-CM | POA: Diagnosis not present

## 2018-06-30 DIAGNOSIS — N2581 Secondary hyperparathyroidism of renal origin: Secondary | ICD-10-CM | POA: Diagnosis not present

## 2018-06-30 DIAGNOSIS — D631 Anemia in chronic kidney disease: Secondary | ICD-10-CM | POA: Diagnosis not present

## 2018-07-01 ENCOUNTER — Other Ambulatory Visit: Payer: Self-pay | Admitting: Family Medicine

## 2018-07-01 ENCOUNTER — Ambulatory Visit
Admission: RE | Admit: 2018-07-01 | Discharge: 2018-07-01 | Disposition: A | Discharge status: Home / Self Care | Payer: Medicare Other | Source: Ambulatory Visit | Attending: Family Medicine | Admitting: Family Medicine

## 2018-07-01 DIAGNOSIS — M546 Pain in thoracic spine: Secondary | ICD-10-CM

## 2018-07-01 DIAGNOSIS — I10 Essential (primary) hypertension: Secondary | ICD-10-CM | POA: Diagnosis not present

## 2018-07-01 DIAGNOSIS — Z23 Encounter for immunization: Secondary | ICD-10-CM | POA: Diagnosis not present

## 2018-07-01 DIAGNOSIS — M47814 Spondylosis without myelopathy or radiculopathy, thoracic region: Secondary | ICD-10-CM | POA: Diagnosis not present

## 2018-07-02 DIAGNOSIS — T8249XA Other complication of vascular dialysis catheter, initial encounter: Secondary | ICD-10-CM | POA: Diagnosis not present

## 2018-07-02 DIAGNOSIS — D689 Coagulation defect, unspecified: Secondary | ICD-10-CM | POA: Diagnosis not present

## 2018-07-02 DIAGNOSIS — N186 End stage renal disease: Secondary | ICD-10-CM | POA: Diagnosis not present

## 2018-07-02 DIAGNOSIS — D631 Anemia in chronic kidney disease: Secondary | ICD-10-CM | POA: Diagnosis not present

## 2018-07-02 DIAGNOSIS — N2581 Secondary hyperparathyroidism of renal origin: Secondary | ICD-10-CM | POA: Diagnosis not present

## 2018-07-04 DIAGNOSIS — N186 End stage renal disease: Secondary | ICD-10-CM | POA: Diagnosis not present

## 2018-07-04 DIAGNOSIS — N2581 Secondary hyperparathyroidism of renal origin: Secondary | ICD-10-CM | POA: Diagnosis not present

## 2018-07-04 DIAGNOSIS — T8249XA Other complication of vascular dialysis catheter, initial encounter: Secondary | ICD-10-CM | POA: Diagnosis not present

## 2018-07-04 DIAGNOSIS — D689 Coagulation defect, unspecified: Secondary | ICD-10-CM | POA: Diagnosis not present

## 2018-07-04 DIAGNOSIS — D631 Anemia in chronic kidney disease: Secondary | ICD-10-CM | POA: Diagnosis not present

## 2018-07-06 DIAGNOSIS — Z452 Encounter for adjustment and management of vascular access device: Secondary | ICD-10-CM | POA: Diagnosis not present

## 2018-07-07 DIAGNOSIS — N2581 Secondary hyperparathyroidism of renal origin: Secondary | ICD-10-CM | POA: Diagnosis not present

## 2018-07-07 DIAGNOSIS — D631 Anemia in chronic kidney disease: Secondary | ICD-10-CM | POA: Diagnosis not present

## 2018-07-07 DIAGNOSIS — N186 End stage renal disease: Secondary | ICD-10-CM | POA: Diagnosis not present

## 2018-07-07 DIAGNOSIS — D689 Coagulation defect, unspecified: Secondary | ICD-10-CM | POA: Diagnosis not present

## 2018-07-07 DIAGNOSIS — T8249XA Other complication of vascular dialysis catheter, initial encounter: Secondary | ICD-10-CM | POA: Diagnosis not present

## 2018-07-08 ENCOUNTER — Other Ambulatory Visit: Payer: Self-pay | Admitting: Family Medicine

## 2018-07-08 DIAGNOSIS — R109 Unspecified abdominal pain: Secondary | ICD-10-CM

## 2018-07-09 DIAGNOSIS — D689 Coagulation defect, unspecified: Secondary | ICD-10-CM | POA: Diagnosis not present

## 2018-07-09 DIAGNOSIS — N186 End stage renal disease: Secondary | ICD-10-CM | POA: Diagnosis not present

## 2018-07-09 DIAGNOSIS — T8249XA Other complication of vascular dialysis catheter, initial encounter: Secondary | ICD-10-CM | POA: Diagnosis not present

## 2018-07-09 DIAGNOSIS — N2581 Secondary hyperparathyroidism of renal origin: Secondary | ICD-10-CM | POA: Diagnosis not present

## 2018-07-09 DIAGNOSIS — D631 Anemia in chronic kidney disease: Secondary | ICD-10-CM | POA: Diagnosis not present

## 2018-07-10 DIAGNOSIS — R05 Cough: Secondary | ICD-10-CM | POA: Diagnosis not present

## 2018-07-10 DIAGNOSIS — D3A090 Benign carcinoid tumor of the bronchus and lung: Secondary | ICD-10-CM | POA: Diagnosis not present

## 2018-07-10 DIAGNOSIS — N186 End stage renal disease: Secondary | ICD-10-CM | POA: Diagnosis not present

## 2018-07-10 DIAGNOSIS — J45909 Unspecified asthma, uncomplicated: Secondary | ICD-10-CM | POA: Diagnosis not present

## 2018-07-11 DIAGNOSIS — N186 End stage renal disease: Secondary | ICD-10-CM | POA: Diagnosis not present

## 2018-07-11 DIAGNOSIS — D689 Coagulation defect, unspecified: Secondary | ICD-10-CM | POA: Diagnosis not present

## 2018-07-11 DIAGNOSIS — N2581 Secondary hyperparathyroidism of renal origin: Secondary | ICD-10-CM | POA: Diagnosis not present

## 2018-07-11 DIAGNOSIS — D631 Anemia in chronic kidney disease: Secondary | ICD-10-CM | POA: Diagnosis not present

## 2018-07-11 DIAGNOSIS — T8249XA Other complication of vascular dialysis catheter, initial encounter: Secondary | ICD-10-CM | POA: Diagnosis not present

## 2018-07-14 DIAGNOSIS — T8249XA Other complication of vascular dialysis catheter, initial encounter: Secondary | ICD-10-CM | POA: Diagnosis not present

## 2018-07-14 DIAGNOSIS — D631 Anemia in chronic kidney disease: Secondary | ICD-10-CM | POA: Diagnosis not present

## 2018-07-14 DIAGNOSIS — N2581 Secondary hyperparathyroidism of renal origin: Secondary | ICD-10-CM | POA: Diagnosis not present

## 2018-07-14 DIAGNOSIS — N186 End stage renal disease: Secondary | ICD-10-CM | POA: Diagnosis not present

## 2018-07-14 DIAGNOSIS — D689 Coagulation defect, unspecified: Secondary | ICD-10-CM | POA: Diagnosis not present

## 2018-07-15 ENCOUNTER — Ambulatory Visit
Admission: RE | Admit: 2018-07-15 | Discharge: 2018-07-15 | Disposition: A | Discharge status: Home / Self Care | Payer: Medicare Other | Source: Ambulatory Visit | Attending: Family Medicine | Admitting: Family Medicine

## 2018-07-15 DIAGNOSIS — R109 Unspecified abdominal pain: Secondary | ICD-10-CM

## 2018-07-15 DIAGNOSIS — N1330 Unspecified hydronephrosis: Secondary | ICD-10-CM | POA: Diagnosis not present

## 2018-07-16 DIAGNOSIS — T8249XA Other complication of vascular dialysis catheter, initial encounter: Secondary | ICD-10-CM | POA: Diagnosis not present

## 2018-07-16 DIAGNOSIS — D689 Coagulation defect, unspecified: Secondary | ICD-10-CM | POA: Diagnosis not present

## 2018-07-16 DIAGNOSIS — N186 End stage renal disease: Secondary | ICD-10-CM | POA: Diagnosis not present

## 2018-07-16 DIAGNOSIS — D631 Anemia in chronic kidney disease: Secondary | ICD-10-CM | POA: Diagnosis not present

## 2018-07-16 DIAGNOSIS — N2581 Secondary hyperparathyroidism of renal origin: Secondary | ICD-10-CM | POA: Diagnosis not present

## 2018-07-18 DIAGNOSIS — N186 End stage renal disease: Secondary | ICD-10-CM | POA: Diagnosis not present

## 2018-07-18 DIAGNOSIS — D689 Coagulation defect, unspecified: Secondary | ICD-10-CM | POA: Diagnosis not present

## 2018-07-18 DIAGNOSIS — N2581 Secondary hyperparathyroidism of renal origin: Secondary | ICD-10-CM | POA: Diagnosis not present

## 2018-07-18 DIAGNOSIS — D631 Anemia in chronic kidney disease: Secondary | ICD-10-CM | POA: Diagnosis not present

## 2018-07-18 DIAGNOSIS — T8249XA Other complication of vascular dialysis catheter, initial encounter: Secondary | ICD-10-CM | POA: Diagnosis not present

## 2018-07-21 DIAGNOSIS — N2581 Secondary hyperparathyroidism of renal origin: Secondary | ICD-10-CM | POA: Diagnosis not present

## 2018-07-21 DIAGNOSIS — D631 Anemia in chronic kidney disease: Secondary | ICD-10-CM | POA: Diagnosis not present

## 2018-07-21 DIAGNOSIS — N186 End stage renal disease: Secondary | ICD-10-CM | POA: Diagnosis not present

## 2018-07-21 DIAGNOSIS — T8249XA Other complication of vascular dialysis catheter, initial encounter: Secondary | ICD-10-CM | POA: Diagnosis not present

## 2018-07-21 DIAGNOSIS — D689 Coagulation defect, unspecified: Secondary | ICD-10-CM | POA: Diagnosis not present

## 2018-07-23 DIAGNOSIS — Z992 Dependence on renal dialysis: Secondary | ICD-10-CM | POA: Diagnosis not present

## 2018-07-23 DIAGNOSIS — N186 End stage renal disease: Secondary | ICD-10-CM | POA: Diagnosis not present

## 2018-07-23 DIAGNOSIS — T8249XA Other complication of vascular dialysis catheter, initial encounter: Secondary | ICD-10-CM | POA: Diagnosis not present

## 2018-07-23 DIAGNOSIS — N2581 Secondary hyperparathyroidism of renal origin: Secondary | ICD-10-CM | POA: Diagnosis not present

## 2018-07-23 DIAGNOSIS — T8612 Kidney transplant failure: Secondary | ICD-10-CM | POA: Diagnosis not present

## 2018-07-23 DIAGNOSIS — D689 Coagulation defect, unspecified: Secondary | ICD-10-CM | POA: Diagnosis not present

## 2018-07-23 DIAGNOSIS — D631 Anemia in chronic kidney disease: Secondary | ICD-10-CM | POA: Diagnosis not present

## 2018-07-25 DIAGNOSIS — D509 Iron deficiency anemia, unspecified: Secondary | ICD-10-CM | POA: Diagnosis not present

## 2018-07-25 DIAGNOSIS — E1129 Type 2 diabetes mellitus with other diabetic kidney complication: Secondary | ICD-10-CM | POA: Diagnosis not present

## 2018-07-25 DIAGNOSIS — E039 Hypothyroidism, unspecified: Secondary | ICD-10-CM | POA: Diagnosis not present

## 2018-07-25 DIAGNOSIS — N2581 Secondary hyperparathyroidism of renal origin: Secondary | ICD-10-CM | POA: Diagnosis not present

## 2018-07-25 DIAGNOSIS — N186 End stage renal disease: Secondary | ICD-10-CM | POA: Diagnosis not present

## 2018-07-25 DIAGNOSIS — D689 Coagulation defect, unspecified: Secondary | ICD-10-CM | POA: Diagnosis not present

## 2018-07-25 DIAGNOSIS — D631 Anemia in chronic kidney disease: Secondary | ICD-10-CM | POA: Diagnosis not present

## 2018-07-25 DIAGNOSIS — R51 Headache: Secondary | ICD-10-CM | POA: Diagnosis not present

## 2018-07-28 DIAGNOSIS — E1129 Type 2 diabetes mellitus with other diabetic kidney complication: Secondary | ICD-10-CM | POA: Diagnosis not present

## 2018-07-28 DIAGNOSIS — N186 End stage renal disease: Secondary | ICD-10-CM | POA: Diagnosis not present

## 2018-07-28 DIAGNOSIS — R51 Headache: Secondary | ICD-10-CM | POA: Diagnosis not present

## 2018-07-28 DIAGNOSIS — N2581 Secondary hyperparathyroidism of renal origin: Secondary | ICD-10-CM | POA: Diagnosis not present

## 2018-07-28 DIAGNOSIS — D631 Anemia in chronic kidney disease: Secondary | ICD-10-CM | POA: Diagnosis not present

## 2018-07-28 DIAGNOSIS — D689 Coagulation defect, unspecified: Secondary | ICD-10-CM | POA: Diagnosis not present

## 2018-07-28 DIAGNOSIS — E039 Hypothyroidism, unspecified: Secondary | ICD-10-CM | POA: Diagnosis not present

## 2018-07-28 DIAGNOSIS — D509 Iron deficiency anemia, unspecified: Secondary | ICD-10-CM | POA: Diagnosis not present

## 2018-07-30 DIAGNOSIS — D509 Iron deficiency anemia, unspecified: Secondary | ICD-10-CM | POA: Diagnosis not present

## 2018-07-30 DIAGNOSIS — N186 End stage renal disease: Secondary | ICD-10-CM | POA: Diagnosis not present

## 2018-07-30 DIAGNOSIS — D689 Coagulation defect, unspecified: Secondary | ICD-10-CM | POA: Diagnosis not present

## 2018-07-30 DIAGNOSIS — N2581 Secondary hyperparathyroidism of renal origin: Secondary | ICD-10-CM | POA: Diagnosis not present

## 2018-07-30 DIAGNOSIS — D631 Anemia in chronic kidney disease: Secondary | ICD-10-CM | POA: Diagnosis not present

## 2018-07-30 DIAGNOSIS — R51 Headache: Secondary | ICD-10-CM | POA: Diagnosis not present

## 2018-07-30 DIAGNOSIS — E1129 Type 2 diabetes mellitus with other diabetic kidney complication: Secondary | ICD-10-CM | POA: Diagnosis not present

## 2018-07-30 DIAGNOSIS — E039 Hypothyroidism, unspecified: Secondary | ICD-10-CM | POA: Diagnosis not present

## 2018-08-01 DIAGNOSIS — D631 Anemia in chronic kidney disease: Secondary | ICD-10-CM | POA: Diagnosis not present

## 2018-08-01 DIAGNOSIS — D689 Coagulation defect, unspecified: Secondary | ICD-10-CM | POA: Diagnosis not present

## 2018-08-01 DIAGNOSIS — E039 Hypothyroidism, unspecified: Secondary | ICD-10-CM | POA: Diagnosis not present

## 2018-08-01 DIAGNOSIS — N2581 Secondary hyperparathyroidism of renal origin: Secondary | ICD-10-CM | POA: Diagnosis not present

## 2018-08-01 DIAGNOSIS — N186 End stage renal disease: Secondary | ICD-10-CM | POA: Diagnosis not present

## 2018-08-01 DIAGNOSIS — D509 Iron deficiency anemia, unspecified: Secondary | ICD-10-CM | POA: Diagnosis not present

## 2018-08-01 DIAGNOSIS — E1129 Type 2 diabetes mellitus with other diabetic kidney complication: Secondary | ICD-10-CM | POA: Diagnosis not present

## 2018-08-01 DIAGNOSIS — R51 Headache: Secondary | ICD-10-CM | POA: Diagnosis not present

## 2018-08-04 DIAGNOSIS — N186 End stage renal disease: Secondary | ICD-10-CM | POA: Diagnosis not present

## 2018-08-04 DIAGNOSIS — D631 Anemia in chronic kidney disease: Secondary | ICD-10-CM | POA: Diagnosis not present

## 2018-08-04 DIAGNOSIS — E1129 Type 2 diabetes mellitus with other diabetic kidney complication: Secondary | ICD-10-CM | POA: Diagnosis not present

## 2018-08-04 DIAGNOSIS — E039 Hypothyroidism, unspecified: Secondary | ICD-10-CM | POA: Diagnosis not present

## 2018-08-04 DIAGNOSIS — D509 Iron deficiency anemia, unspecified: Secondary | ICD-10-CM | POA: Diagnosis not present

## 2018-08-04 DIAGNOSIS — N2581 Secondary hyperparathyroidism of renal origin: Secondary | ICD-10-CM | POA: Diagnosis not present

## 2018-08-04 DIAGNOSIS — R51 Headache: Secondary | ICD-10-CM | POA: Diagnosis not present

## 2018-08-04 DIAGNOSIS — D689 Coagulation defect, unspecified: Secondary | ICD-10-CM | POA: Diagnosis not present

## 2018-08-06 DIAGNOSIS — N2581 Secondary hyperparathyroidism of renal origin: Secondary | ICD-10-CM | POA: Diagnosis not present

## 2018-08-06 DIAGNOSIS — D509 Iron deficiency anemia, unspecified: Secondary | ICD-10-CM | POA: Diagnosis not present

## 2018-08-06 DIAGNOSIS — D689 Coagulation defect, unspecified: Secondary | ICD-10-CM | POA: Diagnosis not present

## 2018-08-06 DIAGNOSIS — D631 Anemia in chronic kidney disease: Secondary | ICD-10-CM | POA: Diagnosis not present

## 2018-08-06 DIAGNOSIS — E039 Hypothyroidism, unspecified: Secondary | ICD-10-CM | POA: Diagnosis not present

## 2018-08-06 DIAGNOSIS — N186 End stage renal disease: Secondary | ICD-10-CM | POA: Diagnosis not present

## 2018-08-06 DIAGNOSIS — R51 Headache: Secondary | ICD-10-CM | POA: Diagnosis not present

## 2018-08-06 DIAGNOSIS — E1129 Type 2 diabetes mellitus with other diabetic kidney complication: Secondary | ICD-10-CM | POA: Diagnosis not present

## 2018-08-07 DIAGNOSIS — Z01818 Encounter for other preprocedural examination: Secondary | ICD-10-CM | POA: Diagnosis not present

## 2018-08-07 DIAGNOSIS — Z7682 Awaiting organ transplant status: Secondary | ICD-10-CM | POA: Diagnosis not present

## 2018-08-08 DIAGNOSIS — D631 Anemia in chronic kidney disease: Secondary | ICD-10-CM | POA: Diagnosis not present

## 2018-08-08 DIAGNOSIS — E1129 Type 2 diabetes mellitus with other diabetic kidney complication: Secondary | ICD-10-CM | POA: Diagnosis not present

## 2018-08-08 DIAGNOSIS — E039 Hypothyroidism, unspecified: Secondary | ICD-10-CM | POA: Diagnosis not present

## 2018-08-08 DIAGNOSIS — D689 Coagulation defect, unspecified: Secondary | ICD-10-CM | POA: Diagnosis not present

## 2018-08-08 DIAGNOSIS — R51 Headache: Secondary | ICD-10-CM | POA: Diagnosis not present

## 2018-08-08 DIAGNOSIS — N186 End stage renal disease: Secondary | ICD-10-CM | POA: Diagnosis not present

## 2018-08-08 DIAGNOSIS — N2581 Secondary hyperparathyroidism of renal origin: Secondary | ICD-10-CM | POA: Diagnosis not present

## 2018-08-08 DIAGNOSIS — D509 Iron deficiency anemia, unspecified: Secondary | ICD-10-CM | POA: Diagnosis not present

## 2018-08-11 DIAGNOSIS — D631 Anemia in chronic kidney disease: Secondary | ICD-10-CM | POA: Diagnosis not present

## 2018-08-11 DIAGNOSIS — N2581 Secondary hyperparathyroidism of renal origin: Secondary | ICD-10-CM | POA: Diagnosis not present

## 2018-08-11 DIAGNOSIS — E1129 Type 2 diabetes mellitus with other diabetic kidney complication: Secondary | ICD-10-CM | POA: Diagnosis not present

## 2018-08-11 DIAGNOSIS — R51 Headache: Secondary | ICD-10-CM | POA: Diagnosis not present

## 2018-08-11 DIAGNOSIS — D509 Iron deficiency anemia, unspecified: Secondary | ICD-10-CM | POA: Diagnosis not present

## 2018-08-11 DIAGNOSIS — E039 Hypothyroidism, unspecified: Secondary | ICD-10-CM | POA: Diagnosis not present

## 2018-08-11 DIAGNOSIS — D689 Coagulation defect, unspecified: Secondary | ICD-10-CM | POA: Diagnosis not present

## 2018-08-11 DIAGNOSIS — N186 End stage renal disease: Secondary | ICD-10-CM | POA: Diagnosis not present

## 2018-08-13 DIAGNOSIS — R51 Headache: Secondary | ICD-10-CM | POA: Diagnosis not present

## 2018-08-13 DIAGNOSIS — N186 End stage renal disease: Secondary | ICD-10-CM | POA: Diagnosis not present

## 2018-08-13 DIAGNOSIS — E1129 Type 2 diabetes mellitus with other diabetic kidney complication: Secondary | ICD-10-CM | POA: Diagnosis not present

## 2018-08-13 DIAGNOSIS — N2581 Secondary hyperparathyroidism of renal origin: Secondary | ICD-10-CM | POA: Diagnosis not present

## 2018-08-13 DIAGNOSIS — D509 Iron deficiency anemia, unspecified: Secondary | ICD-10-CM | POA: Diagnosis not present

## 2018-08-13 DIAGNOSIS — D631 Anemia in chronic kidney disease: Secondary | ICD-10-CM | POA: Diagnosis not present

## 2018-08-13 DIAGNOSIS — D689 Coagulation defect, unspecified: Secondary | ICD-10-CM | POA: Diagnosis not present

## 2018-08-13 DIAGNOSIS — E039 Hypothyroidism, unspecified: Secondary | ICD-10-CM | POA: Diagnosis not present

## 2018-08-15 DIAGNOSIS — D689 Coagulation defect, unspecified: Secondary | ICD-10-CM | POA: Diagnosis not present

## 2018-08-15 DIAGNOSIS — N186 End stage renal disease: Secondary | ICD-10-CM | POA: Diagnosis not present

## 2018-08-15 DIAGNOSIS — E039 Hypothyroidism, unspecified: Secondary | ICD-10-CM | POA: Diagnosis not present

## 2018-08-15 DIAGNOSIS — D509 Iron deficiency anemia, unspecified: Secondary | ICD-10-CM | POA: Diagnosis not present

## 2018-08-15 DIAGNOSIS — E1129 Type 2 diabetes mellitus with other diabetic kidney complication: Secondary | ICD-10-CM | POA: Diagnosis not present

## 2018-08-15 DIAGNOSIS — N2581 Secondary hyperparathyroidism of renal origin: Secondary | ICD-10-CM | POA: Diagnosis not present

## 2018-08-15 DIAGNOSIS — D631 Anemia in chronic kidney disease: Secondary | ICD-10-CM | POA: Diagnosis not present

## 2018-08-15 DIAGNOSIS — R51 Headache: Secondary | ICD-10-CM | POA: Diagnosis not present

## 2018-08-17 DIAGNOSIS — D509 Iron deficiency anemia, unspecified: Secondary | ICD-10-CM | POA: Diagnosis not present

## 2018-08-17 DIAGNOSIS — E039 Hypothyroidism, unspecified: Secondary | ICD-10-CM | POA: Diagnosis not present

## 2018-08-17 DIAGNOSIS — N186 End stage renal disease: Secondary | ICD-10-CM | POA: Diagnosis not present

## 2018-08-17 DIAGNOSIS — E1129 Type 2 diabetes mellitus with other diabetic kidney complication: Secondary | ICD-10-CM | POA: Diagnosis not present

## 2018-08-17 DIAGNOSIS — R51 Headache: Secondary | ICD-10-CM | POA: Diagnosis not present

## 2018-08-17 DIAGNOSIS — N2581 Secondary hyperparathyroidism of renal origin: Secondary | ICD-10-CM | POA: Diagnosis not present

## 2018-08-17 DIAGNOSIS — D631 Anemia in chronic kidney disease: Secondary | ICD-10-CM | POA: Diagnosis not present

## 2018-08-17 DIAGNOSIS — D689 Coagulation defect, unspecified: Secondary | ICD-10-CM | POA: Diagnosis not present

## 2018-08-19 DIAGNOSIS — D631 Anemia in chronic kidney disease: Secondary | ICD-10-CM | POA: Diagnosis not present

## 2018-08-19 DIAGNOSIS — N2581 Secondary hyperparathyroidism of renal origin: Secondary | ICD-10-CM | POA: Diagnosis not present

## 2018-08-19 DIAGNOSIS — D509 Iron deficiency anemia, unspecified: Secondary | ICD-10-CM | POA: Diagnosis not present

## 2018-08-19 DIAGNOSIS — R51 Headache: Secondary | ICD-10-CM | POA: Diagnosis not present

## 2018-08-19 DIAGNOSIS — E1129 Type 2 diabetes mellitus with other diabetic kidney complication: Secondary | ICD-10-CM | POA: Diagnosis not present

## 2018-08-19 DIAGNOSIS — N186 End stage renal disease: Secondary | ICD-10-CM | POA: Diagnosis not present

## 2018-08-19 DIAGNOSIS — D689 Coagulation defect, unspecified: Secondary | ICD-10-CM | POA: Diagnosis not present

## 2018-08-19 DIAGNOSIS — E039 Hypothyroidism, unspecified: Secondary | ICD-10-CM | POA: Diagnosis not present

## 2018-08-22 DIAGNOSIS — Z992 Dependence on renal dialysis: Secondary | ICD-10-CM | POA: Diagnosis not present

## 2018-08-22 DIAGNOSIS — D631 Anemia in chronic kidney disease: Secondary | ICD-10-CM | POA: Diagnosis not present

## 2018-08-22 DIAGNOSIS — T8612 Kidney transplant failure: Secondary | ICD-10-CM | POA: Diagnosis not present

## 2018-08-22 DIAGNOSIS — N186 End stage renal disease: Secondary | ICD-10-CM | POA: Diagnosis not present

## 2018-08-22 DIAGNOSIS — D509 Iron deficiency anemia, unspecified: Secondary | ICD-10-CM | POA: Diagnosis not present

## 2018-08-22 DIAGNOSIS — E1129 Type 2 diabetes mellitus with other diabetic kidney complication: Secondary | ICD-10-CM | POA: Diagnosis not present

## 2018-08-22 DIAGNOSIS — E039 Hypothyroidism, unspecified: Secondary | ICD-10-CM | POA: Diagnosis not present

## 2018-08-22 DIAGNOSIS — D689 Coagulation defect, unspecified: Secondary | ICD-10-CM | POA: Diagnosis not present

## 2018-08-22 DIAGNOSIS — N2581 Secondary hyperparathyroidism of renal origin: Secondary | ICD-10-CM | POA: Diagnosis not present

## 2018-08-22 DIAGNOSIS — R51 Headache: Secondary | ICD-10-CM | POA: Diagnosis not present

## 2018-09-04 DIAGNOSIS — N186 End stage renal disease: Secondary | ICD-10-CM | POA: Diagnosis not present

## 2018-09-04 DIAGNOSIS — D631 Anemia in chronic kidney disease: Secondary | ICD-10-CM | POA: Diagnosis not present

## 2018-09-04 DIAGNOSIS — D689 Coagulation defect, unspecified: Secondary | ICD-10-CM | POA: Diagnosis not present

## 2018-09-04 DIAGNOSIS — R52 Pain, unspecified: Secondary | ICD-10-CM | POA: Diagnosis not present

## 2018-09-04 DIAGNOSIS — D509 Iron deficiency anemia, unspecified: Secondary | ICD-10-CM | POA: Diagnosis not present

## 2018-09-04 DIAGNOSIS — E039 Hypothyroidism, unspecified: Secondary | ICD-10-CM | POA: Diagnosis not present

## 2018-09-04 DIAGNOSIS — N2581 Secondary hyperparathyroidism of renal origin: Secondary | ICD-10-CM | POA: Diagnosis not present

## 2018-09-04 DIAGNOSIS — R51 Headache: Secondary | ICD-10-CM | POA: Diagnosis not present

## 2018-09-09 DIAGNOSIS — Z1231 Encounter for screening mammogram for malignant neoplasm of breast: Secondary | ICD-10-CM | POA: Diagnosis not present

## 2018-09-10 DIAGNOSIS — R52 Pain, unspecified: Secondary | ICD-10-CM | POA: Diagnosis not present

## 2018-09-10 DIAGNOSIS — E039 Hypothyroidism, unspecified: Secondary | ICD-10-CM | POA: Diagnosis not present

## 2018-09-10 DIAGNOSIS — N2581 Secondary hyperparathyroidism of renal origin: Secondary | ICD-10-CM | POA: Diagnosis not present

## 2018-09-10 DIAGNOSIS — D631 Anemia in chronic kidney disease: Secondary | ICD-10-CM | POA: Diagnosis not present

## 2018-09-10 DIAGNOSIS — D689 Coagulation defect, unspecified: Secondary | ICD-10-CM | POA: Diagnosis not present

## 2018-09-10 DIAGNOSIS — D509 Iron deficiency anemia, unspecified: Secondary | ICD-10-CM | POA: Diagnosis not present

## 2018-09-10 DIAGNOSIS — R51 Headache: Secondary | ICD-10-CM | POA: Diagnosis not present

## 2018-09-10 DIAGNOSIS — N186 End stage renal disease: Secondary | ICD-10-CM | POA: Diagnosis not present

## 2018-09-12 DIAGNOSIS — D631 Anemia in chronic kidney disease: Secondary | ICD-10-CM | POA: Diagnosis not present

## 2018-09-12 DIAGNOSIS — D509 Iron deficiency anemia, unspecified: Secondary | ICD-10-CM | POA: Diagnosis not present

## 2018-09-12 DIAGNOSIS — R51 Headache: Secondary | ICD-10-CM | POA: Diagnosis not present

## 2018-09-12 DIAGNOSIS — N186 End stage renal disease: Secondary | ICD-10-CM | POA: Diagnosis not present

## 2018-09-12 DIAGNOSIS — D689 Coagulation defect, unspecified: Secondary | ICD-10-CM | POA: Diagnosis not present

## 2018-09-12 DIAGNOSIS — N2581 Secondary hyperparathyroidism of renal origin: Secondary | ICD-10-CM | POA: Diagnosis not present

## 2018-09-12 DIAGNOSIS — E039 Hypothyroidism, unspecified: Secondary | ICD-10-CM | POA: Diagnosis not present

## 2018-09-12 DIAGNOSIS — R52 Pain, unspecified: Secondary | ICD-10-CM | POA: Diagnosis not present

## 2018-09-14 DIAGNOSIS — D509 Iron deficiency anemia, unspecified: Secondary | ICD-10-CM | POA: Diagnosis not present

## 2018-09-14 DIAGNOSIS — E039 Hypothyroidism, unspecified: Secondary | ICD-10-CM | POA: Diagnosis not present

## 2018-09-14 DIAGNOSIS — R51 Headache: Secondary | ICD-10-CM | POA: Diagnosis not present

## 2018-09-14 DIAGNOSIS — N186 End stage renal disease: Secondary | ICD-10-CM | POA: Diagnosis not present

## 2018-09-14 DIAGNOSIS — R52 Pain, unspecified: Secondary | ICD-10-CM | POA: Diagnosis not present

## 2018-09-14 DIAGNOSIS — N2581 Secondary hyperparathyroidism of renal origin: Secondary | ICD-10-CM | POA: Diagnosis not present

## 2018-09-14 DIAGNOSIS — D689 Coagulation defect, unspecified: Secondary | ICD-10-CM | POA: Diagnosis not present

## 2018-09-14 DIAGNOSIS — D631 Anemia in chronic kidney disease: Secondary | ICD-10-CM | POA: Diagnosis not present

## 2018-09-17 DIAGNOSIS — D689 Coagulation defect, unspecified: Secondary | ICD-10-CM | POA: Diagnosis not present

## 2018-09-17 DIAGNOSIS — N2581 Secondary hyperparathyroidism of renal origin: Secondary | ICD-10-CM | POA: Diagnosis not present

## 2018-09-17 DIAGNOSIS — R52 Pain, unspecified: Secondary | ICD-10-CM | POA: Diagnosis not present

## 2018-09-17 DIAGNOSIS — D509 Iron deficiency anemia, unspecified: Secondary | ICD-10-CM | POA: Diagnosis not present

## 2018-09-17 DIAGNOSIS — R51 Headache: Secondary | ICD-10-CM | POA: Diagnosis not present

## 2018-09-17 DIAGNOSIS — N186 End stage renal disease: Secondary | ICD-10-CM | POA: Diagnosis not present

## 2018-09-17 DIAGNOSIS — E039 Hypothyroidism, unspecified: Secondary | ICD-10-CM | POA: Diagnosis not present

## 2018-09-17 DIAGNOSIS — D631 Anemia in chronic kidney disease: Secondary | ICD-10-CM | POA: Diagnosis not present

## 2018-09-19 DIAGNOSIS — R52 Pain, unspecified: Secondary | ICD-10-CM | POA: Diagnosis not present

## 2018-09-19 DIAGNOSIS — R51 Headache: Secondary | ICD-10-CM | POA: Diagnosis not present

## 2018-09-19 DIAGNOSIS — N2581 Secondary hyperparathyroidism of renal origin: Secondary | ICD-10-CM | POA: Diagnosis not present

## 2018-09-19 DIAGNOSIS — N186 End stage renal disease: Secondary | ICD-10-CM | POA: Diagnosis not present

## 2018-09-19 DIAGNOSIS — E039 Hypothyroidism, unspecified: Secondary | ICD-10-CM | POA: Diagnosis not present

## 2018-09-19 DIAGNOSIS — D631 Anemia in chronic kidney disease: Secondary | ICD-10-CM | POA: Diagnosis not present

## 2018-09-19 DIAGNOSIS — D509 Iron deficiency anemia, unspecified: Secondary | ICD-10-CM | POA: Diagnosis not present

## 2018-09-19 DIAGNOSIS — D689 Coagulation defect, unspecified: Secondary | ICD-10-CM | POA: Diagnosis not present

## 2018-09-21 DIAGNOSIS — R52 Pain, unspecified: Secondary | ICD-10-CM | POA: Diagnosis not present

## 2018-09-21 DIAGNOSIS — D689 Coagulation defect, unspecified: Secondary | ICD-10-CM | POA: Diagnosis not present

## 2018-09-21 DIAGNOSIS — N186 End stage renal disease: Secondary | ICD-10-CM | POA: Diagnosis not present

## 2018-09-21 DIAGNOSIS — N2581 Secondary hyperparathyroidism of renal origin: Secondary | ICD-10-CM | POA: Diagnosis not present

## 2018-09-21 DIAGNOSIS — R51 Headache: Secondary | ICD-10-CM | POA: Diagnosis not present

## 2018-09-21 DIAGNOSIS — D509 Iron deficiency anemia, unspecified: Secondary | ICD-10-CM | POA: Diagnosis not present

## 2018-09-21 DIAGNOSIS — D631 Anemia in chronic kidney disease: Secondary | ICD-10-CM | POA: Diagnosis not present

## 2018-09-21 DIAGNOSIS — E039 Hypothyroidism, unspecified: Secondary | ICD-10-CM | POA: Diagnosis not present

## 2018-09-22 DIAGNOSIS — T8612 Kidney transplant failure: Secondary | ICD-10-CM | POA: Diagnosis not present

## 2018-09-22 DIAGNOSIS — Z992 Dependence on renal dialysis: Secondary | ICD-10-CM | POA: Diagnosis not present

## 2018-09-22 DIAGNOSIS — N186 End stage renal disease: Secondary | ICD-10-CM | POA: Diagnosis not present

## 2018-09-24 DIAGNOSIS — D689 Coagulation defect, unspecified: Secondary | ICD-10-CM | POA: Diagnosis not present

## 2018-09-24 DIAGNOSIS — E1129 Type 2 diabetes mellitus with other diabetic kidney complication: Secondary | ICD-10-CM | POA: Diagnosis not present

## 2018-09-24 DIAGNOSIS — E039 Hypothyroidism, unspecified: Secondary | ICD-10-CM | POA: Diagnosis not present

## 2018-09-24 DIAGNOSIS — N186 End stage renal disease: Secondary | ICD-10-CM | POA: Diagnosis not present

## 2018-09-24 DIAGNOSIS — D509 Iron deficiency anemia, unspecified: Secondary | ICD-10-CM | POA: Diagnosis not present

## 2018-09-24 DIAGNOSIS — R52 Pain, unspecified: Secondary | ICD-10-CM | POA: Diagnosis not present

## 2018-09-24 DIAGNOSIS — D631 Anemia in chronic kidney disease: Secondary | ICD-10-CM | POA: Diagnosis not present

## 2018-09-24 DIAGNOSIS — N2581 Secondary hyperparathyroidism of renal origin: Secondary | ICD-10-CM | POA: Diagnosis not present

## 2018-09-26 DIAGNOSIS — D631 Anemia in chronic kidney disease: Secondary | ICD-10-CM | POA: Diagnosis not present

## 2018-09-26 DIAGNOSIS — N186 End stage renal disease: Secondary | ICD-10-CM | POA: Diagnosis not present

## 2018-09-26 DIAGNOSIS — R52 Pain, unspecified: Secondary | ICD-10-CM | POA: Diagnosis not present

## 2018-09-26 DIAGNOSIS — N2581 Secondary hyperparathyroidism of renal origin: Secondary | ICD-10-CM | POA: Diagnosis not present

## 2018-09-26 DIAGNOSIS — D689 Coagulation defect, unspecified: Secondary | ICD-10-CM | POA: Diagnosis not present

## 2018-09-26 DIAGNOSIS — E1129 Type 2 diabetes mellitus with other diabetic kidney complication: Secondary | ICD-10-CM | POA: Diagnosis not present

## 2018-09-26 DIAGNOSIS — E039 Hypothyroidism, unspecified: Secondary | ICD-10-CM | POA: Diagnosis not present

## 2018-09-26 DIAGNOSIS — D509 Iron deficiency anemia, unspecified: Secondary | ICD-10-CM | POA: Diagnosis not present

## 2018-09-29 DIAGNOSIS — D509 Iron deficiency anemia, unspecified: Secondary | ICD-10-CM | POA: Diagnosis not present

## 2018-09-29 DIAGNOSIS — R52 Pain, unspecified: Secondary | ICD-10-CM | POA: Diagnosis not present

## 2018-09-29 DIAGNOSIS — N186 End stage renal disease: Secondary | ICD-10-CM | POA: Diagnosis not present

## 2018-09-29 DIAGNOSIS — D631 Anemia in chronic kidney disease: Secondary | ICD-10-CM | POA: Diagnosis not present

## 2018-09-29 DIAGNOSIS — E1129 Type 2 diabetes mellitus with other diabetic kidney complication: Secondary | ICD-10-CM | POA: Diagnosis not present

## 2018-09-29 DIAGNOSIS — E039 Hypothyroidism, unspecified: Secondary | ICD-10-CM | POA: Diagnosis not present

## 2018-09-29 DIAGNOSIS — D689 Coagulation defect, unspecified: Secondary | ICD-10-CM | POA: Diagnosis not present

## 2018-09-29 DIAGNOSIS — N2581 Secondary hyperparathyroidism of renal origin: Secondary | ICD-10-CM | POA: Diagnosis not present

## 2018-10-01 DIAGNOSIS — E1129 Type 2 diabetes mellitus with other diabetic kidney complication: Secondary | ICD-10-CM | POA: Diagnosis not present

## 2018-10-01 DIAGNOSIS — R52 Pain, unspecified: Secondary | ICD-10-CM | POA: Diagnosis not present

## 2018-10-01 DIAGNOSIS — E039 Hypothyroidism, unspecified: Secondary | ICD-10-CM | POA: Diagnosis not present

## 2018-10-01 DIAGNOSIS — N2581 Secondary hyperparathyroidism of renal origin: Secondary | ICD-10-CM | POA: Diagnosis not present

## 2018-10-01 DIAGNOSIS — D689 Coagulation defect, unspecified: Secondary | ICD-10-CM | POA: Diagnosis not present

## 2018-10-01 DIAGNOSIS — D631 Anemia in chronic kidney disease: Secondary | ICD-10-CM | POA: Diagnosis not present

## 2018-10-01 DIAGNOSIS — N186 End stage renal disease: Secondary | ICD-10-CM | POA: Diagnosis not present

## 2018-10-01 DIAGNOSIS — D509 Iron deficiency anemia, unspecified: Secondary | ICD-10-CM | POA: Diagnosis not present

## 2018-10-03 DIAGNOSIS — D509 Iron deficiency anemia, unspecified: Secondary | ICD-10-CM | POA: Diagnosis not present

## 2018-10-03 DIAGNOSIS — N2581 Secondary hyperparathyroidism of renal origin: Secondary | ICD-10-CM | POA: Diagnosis not present

## 2018-10-03 DIAGNOSIS — E1129 Type 2 diabetes mellitus with other diabetic kidney complication: Secondary | ICD-10-CM | POA: Diagnosis not present

## 2018-10-03 DIAGNOSIS — D689 Coagulation defect, unspecified: Secondary | ICD-10-CM | POA: Diagnosis not present

## 2018-10-03 DIAGNOSIS — R52 Pain, unspecified: Secondary | ICD-10-CM | POA: Diagnosis not present

## 2018-10-03 DIAGNOSIS — D631 Anemia in chronic kidney disease: Secondary | ICD-10-CM | POA: Diagnosis not present

## 2018-10-03 DIAGNOSIS — E039 Hypothyroidism, unspecified: Secondary | ICD-10-CM | POA: Diagnosis not present

## 2018-10-03 DIAGNOSIS — N186 End stage renal disease: Secondary | ICD-10-CM | POA: Diagnosis not present

## 2018-10-06 DIAGNOSIS — D631 Anemia in chronic kidney disease: Secondary | ICD-10-CM | POA: Diagnosis not present

## 2018-10-06 DIAGNOSIS — D509 Iron deficiency anemia, unspecified: Secondary | ICD-10-CM | POA: Diagnosis not present

## 2018-10-06 DIAGNOSIS — N2581 Secondary hyperparathyroidism of renal origin: Secondary | ICD-10-CM | POA: Diagnosis not present

## 2018-10-06 DIAGNOSIS — E1129 Type 2 diabetes mellitus with other diabetic kidney complication: Secondary | ICD-10-CM | POA: Diagnosis not present

## 2018-10-06 DIAGNOSIS — E039 Hypothyroidism, unspecified: Secondary | ICD-10-CM | POA: Diagnosis not present

## 2018-10-06 DIAGNOSIS — N186 End stage renal disease: Secondary | ICD-10-CM | POA: Diagnosis not present

## 2018-10-06 DIAGNOSIS — R52 Pain, unspecified: Secondary | ICD-10-CM | POA: Diagnosis not present

## 2018-10-06 DIAGNOSIS — D689 Coagulation defect, unspecified: Secondary | ICD-10-CM | POA: Diagnosis not present

## 2018-10-08 DIAGNOSIS — E1129 Type 2 diabetes mellitus with other diabetic kidney complication: Secondary | ICD-10-CM | POA: Diagnosis not present

## 2018-10-08 DIAGNOSIS — D689 Coagulation defect, unspecified: Secondary | ICD-10-CM | POA: Diagnosis not present

## 2018-10-08 DIAGNOSIS — D509 Iron deficiency anemia, unspecified: Secondary | ICD-10-CM | POA: Diagnosis not present

## 2018-10-08 DIAGNOSIS — N2581 Secondary hyperparathyroidism of renal origin: Secondary | ICD-10-CM | POA: Diagnosis not present

## 2018-10-08 DIAGNOSIS — D631 Anemia in chronic kidney disease: Secondary | ICD-10-CM | POA: Diagnosis not present

## 2018-10-08 DIAGNOSIS — R52 Pain, unspecified: Secondary | ICD-10-CM | POA: Diagnosis not present

## 2018-10-08 DIAGNOSIS — E039 Hypothyroidism, unspecified: Secondary | ICD-10-CM | POA: Diagnosis not present

## 2018-10-08 DIAGNOSIS — N186 End stage renal disease: Secondary | ICD-10-CM | POA: Diagnosis not present

## 2018-10-10 DIAGNOSIS — E1129 Type 2 diabetes mellitus with other diabetic kidney complication: Secondary | ICD-10-CM | POA: Diagnosis not present

## 2018-10-10 DIAGNOSIS — N186 End stage renal disease: Secondary | ICD-10-CM | POA: Diagnosis not present

## 2018-10-10 DIAGNOSIS — R52 Pain, unspecified: Secondary | ICD-10-CM | POA: Diagnosis not present

## 2018-10-10 DIAGNOSIS — D689 Coagulation defect, unspecified: Secondary | ICD-10-CM | POA: Diagnosis not present

## 2018-10-10 DIAGNOSIS — D631 Anemia in chronic kidney disease: Secondary | ICD-10-CM | POA: Diagnosis not present

## 2018-10-10 DIAGNOSIS — N2581 Secondary hyperparathyroidism of renal origin: Secondary | ICD-10-CM | POA: Diagnosis not present

## 2018-10-10 DIAGNOSIS — D509 Iron deficiency anemia, unspecified: Secondary | ICD-10-CM | POA: Diagnosis not present

## 2018-10-10 DIAGNOSIS — E039 Hypothyroidism, unspecified: Secondary | ICD-10-CM | POA: Diagnosis not present

## 2018-10-13 DIAGNOSIS — N2581 Secondary hyperparathyroidism of renal origin: Secondary | ICD-10-CM | POA: Diagnosis not present

## 2018-10-13 DIAGNOSIS — N186 End stage renal disease: Secondary | ICD-10-CM | POA: Diagnosis not present

## 2018-10-13 DIAGNOSIS — E039 Hypothyroidism, unspecified: Secondary | ICD-10-CM | POA: Diagnosis not present

## 2018-10-13 DIAGNOSIS — D631 Anemia in chronic kidney disease: Secondary | ICD-10-CM | POA: Diagnosis not present

## 2018-10-13 DIAGNOSIS — D689 Coagulation defect, unspecified: Secondary | ICD-10-CM | POA: Diagnosis not present

## 2018-10-13 DIAGNOSIS — E1129 Type 2 diabetes mellitus with other diabetic kidney complication: Secondary | ICD-10-CM | POA: Diagnosis not present

## 2018-10-13 DIAGNOSIS — D509 Iron deficiency anemia, unspecified: Secondary | ICD-10-CM | POA: Diagnosis not present

## 2018-10-13 DIAGNOSIS — R52 Pain, unspecified: Secondary | ICD-10-CM | POA: Diagnosis not present

## 2018-10-15 DIAGNOSIS — E039 Hypothyroidism, unspecified: Secondary | ICD-10-CM | POA: Diagnosis not present

## 2018-10-15 DIAGNOSIS — D509 Iron deficiency anemia, unspecified: Secondary | ICD-10-CM | POA: Diagnosis not present

## 2018-10-15 DIAGNOSIS — N2581 Secondary hyperparathyroidism of renal origin: Secondary | ICD-10-CM | POA: Diagnosis not present

## 2018-10-15 DIAGNOSIS — N186 End stage renal disease: Secondary | ICD-10-CM | POA: Diagnosis not present

## 2018-10-15 DIAGNOSIS — D689 Coagulation defect, unspecified: Secondary | ICD-10-CM | POA: Diagnosis not present

## 2018-10-15 DIAGNOSIS — E1129 Type 2 diabetes mellitus with other diabetic kidney complication: Secondary | ICD-10-CM | POA: Diagnosis not present

## 2018-10-15 DIAGNOSIS — D631 Anemia in chronic kidney disease: Secondary | ICD-10-CM | POA: Diagnosis not present

## 2018-10-15 DIAGNOSIS — R52 Pain, unspecified: Secondary | ICD-10-CM | POA: Diagnosis not present

## 2018-10-17 DIAGNOSIS — D631 Anemia in chronic kidney disease: Secondary | ICD-10-CM | POA: Diagnosis not present

## 2018-10-17 DIAGNOSIS — E039 Hypothyroidism, unspecified: Secondary | ICD-10-CM | POA: Diagnosis not present

## 2018-10-17 DIAGNOSIS — D509 Iron deficiency anemia, unspecified: Secondary | ICD-10-CM | POA: Diagnosis not present

## 2018-10-17 DIAGNOSIS — D689 Coagulation defect, unspecified: Secondary | ICD-10-CM | POA: Diagnosis not present

## 2018-10-17 DIAGNOSIS — N186 End stage renal disease: Secondary | ICD-10-CM | POA: Diagnosis not present

## 2018-10-17 DIAGNOSIS — E1129 Type 2 diabetes mellitus with other diabetic kidney complication: Secondary | ICD-10-CM | POA: Diagnosis not present

## 2018-10-17 DIAGNOSIS — R52 Pain, unspecified: Secondary | ICD-10-CM | POA: Diagnosis not present

## 2018-10-17 DIAGNOSIS — N2581 Secondary hyperparathyroidism of renal origin: Secondary | ICD-10-CM | POA: Diagnosis not present

## 2018-10-20 DIAGNOSIS — E1129 Type 2 diabetes mellitus with other diabetic kidney complication: Secondary | ICD-10-CM | POA: Diagnosis not present

## 2018-10-20 DIAGNOSIS — N186 End stage renal disease: Secondary | ICD-10-CM | POA: Diagnosis not present

## 2018-10-20 DIAGNOSIS — D689 Coagulation defect, unspecified: Secondary | ICD-10-CM | POA: Diagnosis not present

## 2018-10-20 DIAGNOSIS — D631 Anemia in chronic kidney disease: Secondary | ICD-10-CM | POA: Diagnosis not present

## 2018-10-20 DIAGNOSIS — N2581 Secondary hyperparathyroidism of renal origin: Secondary | ICD-10-CM | POA: Diagnosis not present

## 2018-10-20 DIAGNOSIS — D509 Iron deficiency anemia, unspecified: Secondary | ICD-10-CM | POA: Diagnosis not present

## 2018-10-20 DIAGNOSIS — E039 Hypothyroidism, unspecified: Secondary | ICD-10-CM | POA: Diagnosis not present

## 2018-10-20 DIAGNOSIS — R52 Pain, unspecified: Secondary | ICD-10-CM | POA: Diagnosis not present

## 2018-10-22 DIAGNOSIS — D631 Anemia in chronic kidney disease: Secondary | ICD-10-CM | POA: Diagnosis not present

## 2018-10-22 DIAGNOSIS — N2581 Secondary hyperparathyroidism of renal origin: Secondary | ICD-10-CM | POA: Diagnosis not present

## 2018-10-22 DIAGNOSIS — E1129 Type 2 diabetes mellitus with other diabetic kidney complication: Secondary | ICD-10-CM | POA: Diagnosis not present

## 2018-10-22 DIAGNOSIS — R52 Pain, unspecified: Secondary | ICD-10-CM | POA: Diagnosis not present

## 2018-10-22 DIAGNOSIS — D509 Iron deficiency anemia, unspecified: Secondary | ICD-10-CM | POA: Diagnosis not present

## 2018-10-22 DIAGNOSIS — D689 Coagulation defect, unspecified: Secondary | ICD-10-CM | POA: Diagnosis not present

## 2018-10-22 DIAGNOSIS — E039 Hypothyroidism, unspecified: Secondary | ICD-10-CM | POA: Diagnosis not present

## 2018-10-22 DIAGNOSIS — N186 End stage renal disease: Secondary | ICD-10-CM | POA: Diagnosis not present

## 2018-10-23 DIAGNOSIS — Z992 Dependence on renal dialysis: Secondary | ICD-10-CM | POA: Diagnosis not present

## 2018-10-23 DIAGNOSIS — T8612 Kidney transplant failure: Secondary | ICD-10-CM | POA: Diagnosis not present

## 2018-10-23 DIAGNOSIS — M19031 Primary osteoarthritis, right wrist: Secondary | ICD-10-CM | POA: Diagnosis not present

## 2018-10-23 DIAGNOSIS — N186 End stage renal disease: Secondary | ICD-10-CM | POA: Diagnosis not present

## 2018-10-23 DIAGNOSIS — M65351 Trigger finger, right little finger: Secondary | ICD-10-CM | POA: Diagnosis not present

## 2018-10-23 DIAGNOSIS — M180 Bilateral primary osteoarthritis of first carpometacarpal joints: Secondary | ICD-10-CM | POA: Diagnosis not present

## 2018-10-23 DIAGNOSIS — S63641A Sprain of metacarpophalangeal joint of right thumb, initial encounter: Secondary | ICD-10-CM | POA: Diagnosis not present

## 2018-10-23 DIAGNOSIS — M19032 Primary osteoarthritis, left wrist: Secondary | ICD-10-CM | POA: Diagnosis not present

## 2018-10-24 DIAGNOSIS — D689 Coagulation defect, unspecified: Secondary | ICD-10-CM | POA: Diagnosis not present

## 2018-10-24 DIAGNOSIS — N2581 Secondary hyperparathyroidism of renal origin: Secondary | ICD-10-CM | POA: Diagnosis not present

## 2018-10-24 DIAGNOSIS — R51 Headache: Secondary | ICD-10-CM | POA: Diagnosis not present

## 2018-10-24 DIAGNOSIS — R52 Pain, unspecified: Secondary | ICD-10-CM | POA: Diagnosis not present

## 2018-10-24 DIAGNOSIS — N186 End stage renal disease: Secondary | ICD-10-CM | POA: Diagnosis not present

## 2018-10-24 DIAGNOSIS — E039 Hypothyroidism, unspecified: Secondary | ICD-10-CM | POA: Diagnosis not present

## 2018-10-24 DIAGNOSIS — D509 Iron deficiency anemia, unspecified: Secondary | ICD-10-CM | POA: Diagnosis not present

## 2018-10-27 DIAGNOSIS — D689 Coagulation defect, unspecified: Secondary | ICD-10-CM | POA: Diagnosis not present

## 2018-10-27 DIAGNOSIS — N186 End stage renal disease: Secondary | ICD-10-CM | POA: Diagnosis not present

## 2018-10-27 DIAGNOSIS — R51 Headache: Secondary | ICD-10-CM | POA: Diagnosis not present

## 2018-10-27 DIAGNOSIS — R52 Pain, unspecified: Secondary | ICD-10-CM | POA: Diagnosis not present

## 2018-10-27 DIAGNOSIS — N2581 Secondary hyperparathyroidism of renal origin: Secondary | ICD-10-CM | POA: Diagnosis not present

## 2018-10-27 DIAGNOSIS — E039 Hypothyroidism, unspecified: Secondary | ICD-10-CM | POA: Diagnosis not present

## 2018-10-27 DIAGNOSIS — D509 Iron deficiency anemia, unspecified: Secondary | ICD-10-CM | POA: Diagnosis not present

## 2018-10-29 DIAGNOSIS — R52 Pain, unspecified: Secondary | ICD-10-CM | POA: Diagnosis not present

## 2018-10-29 DIAGNOSIS — D689 Coagulation defect, unspecified: Secondary | ICD-10-CM | POA: Diagnosis not present

## 2018-10-29 DIAGNOSIS — D509 Iron deficiency anemia, unspecified: Secondary | ICD-10-CM | POA: Diagnosis not present

## 2018-10-29 DIAGNOSIS — R51 Headache: Secondary | ICD-10-CM | POA: Diagnosis not present

## 2018-10-29 DIAGNOSIS — N186 End stage renal disease: Secondary | ICD-10-CM | POA: Diagnosis not present

## 2018-10-29 DIAGNOSIS — E039 Hypothyroidism, unspecified: Secondary | ICD-10-CM | POA: Diagnosis not present

## 2018-10-29 DIAGNOSIS — N2581 Secondary hyperparathyroidism of renal origin: Secondary | ICD-10-CM | POA: Diagnosis not present

## 2018-10-30 DIAGNOSIS — M79644 Pain in right finger(s): Secondary | ICD-10-CM | POA: Diagnosis not present

## 2018-10-30 DIAGNOSIS — M180 Bilateral primary osteoarthritis of first carpometacarpal joints: Secondary | ICD-10-CM | POA: Diagnosis not present

## 2018-10-31 DIAGNOSIS — N2581 Secondary hyperparathyroidism of renal origin: Secondary | ICD-10-CM | POA: Diagnosis not present

## 2018-10-31 DIAGNOSIS — R51 Headache: Secondary | ICD-10-CM | POA: Diagnosis not present

## 2018-10-31 DIAGNOSIS — D509 Iron deficiency anemia, unspecified: Secondary | ICD-10-CM | POA: Diagnosis not present

## 2018-10-31 DIAGNOSIS — R52 Pain, unspecified: Secondary | ICD-10-CM | POA: Diagnosis not present

## 2018-10-31 DIAGNOSIS — N186 End stage renal disease: Secondary | ICD-10-CM | POA: Diagnosis not present

## 2018-10-31 DIAGNOSIS — E039 Hypothyroidism, unspecified: Secondary | ICD-10-CM | POA: Diagnosis not present

## 2018-10-31 DIAGNOSIS — D689 Coagulation defect, unspecified: Secondary | ICD-10-CM | POA: Diagnosis not present

## 2018-11-03 DIAGNOSIS — D509 Iron deficiency anemia, unspecified: Secondary | ICD-10-CM | POA: Diagnosis not present

## 2018-11-03 DIAGNOSIS — D689 Coagulation defect, unspecified: Secondary | ICD-10-CM | POA: Diagnosis not present

## 2018-11-03 DIAGNOSIS — E039 Hypothyroidism, unspecified: Secondary | ICD-10-CM | POA: Diagnosis not present

## 2018-11-03 DIAGNOSIS — N186 End stage renal disease: Secondary | ICD-10-CM | POA: Diagnosis not present

## 2018-11-03 DIAGNOSIS — R52 Pain, unspecified: Secondary | ICD-10-CM | POA: Diagnosis not present

## 2018-11-03 DIAGNOSIS — R51 Headache: Secondary | ICD-10-CM | POA: Diagnosis not present

## 2018-11-03 DIAGNOSIS — N2581 Secondary hyperparathyroidism of renal origin: Secondary | ICD-10-CM | POA: Diagnosis not present

## 2018-11-04 DIAGNOSIS — D509 Iron deficiency anemia, unspecified: Secondary | ICD-10-CM | POA: Diagnosis not present

## 2018-11-04 DIAGNOSIS — E039 Hypothyroidism, unspecified: Secondary | ICD-10-CM | POA: Diagnosis not present

## 2018-11-04 DIAGNOSIS — D689 Coagulation defect, unspecified: Secondary | ICD-10-CM | POA: Diagnosis not present

## 2018-11-04 DIAGNOSIS — R52 Pain, unspecified: Secondary | ICD-10-CM | POA: Diagnosis not present

## 2018-11-04 DIAGNOSIS — N186 End stage renal disease: Secondary | ICD-10-CM | POA: Diagnosis not present

## 2018-11-04 DIAGNOSIS — N2581 Secondary hyperparathyroidism of renal origin: Secondary | ICD-10-CM | POA: Diagnosis not present

## 2018-11-04 DIAGNOSIS — R51 Headache: Secondary | ICD-10-CM | POA: Diagnosis not present

## 2018-11-05 DIAGNOSIS — R51 Headache: Secondary | ICD-10-CM | POA: Diagnosis not present

## 2018-11-05 DIAGNOSIS — D509 Iron deficiency anemia, unspecified: Secondary | ICD-10-CM | POA: Diagnosis not present

## 2018-11-05 DIAGNOSIS — R52 Pain, unspecified: Secondary | ICD-10-CM | POA: Diagnosis not present

## 2018-11-05 DIAGNOSIS — E039 Hypothyroidism, unspecified: Secondary | ICD-10-CM | POA: Diagnosis not present

## 2018-11-05 DIAGNOSIS — D689 Coagulation defect, unspecified: Secondary | ICD-10-CM | POA: Diagnosis not present

## 2018-11-05 DIAGNOSIS — N186 End stage renal disease: Secondary | ICD-10-CM | POA: Diagnosis not present

## 2018-11-05 DIAGNOSIS — N2581 Secondary hyperparathyroidism of renal origin: Secondary | ICD-10-CM | POA: Diagnosis not present

## 2018-11-06 DIAGNOSIS — M19032 Primary osteoarthritis, left wrist: Secondary | ICD-10-CM | POA: Diagnosis not present

## 2018-11-06 DIAGNOSIS — M19031 Primary osteoarthritis, right wrist: Secondary | ICD-10-CM | POA: Diagnosis not present

## 2018-11-07 DIAGNOSIS — D689 Coagulation defect, unspecified: Secondary | ICD-10-CM | POA: Diagnosis not present

## 2018-11-07 DIAGNOSIS — E039 Hypothyroidism, unspecified: Secondary | ICD-10-CM | POA: Diagnosis not present

## 2018-11-07 DIAGNOSIS — N186 End stage renal disease: Secondary | ICD-10-CM | POA: Diagnosis not present

## 2018-11-07 DIAGNOSIS — R52 Pain, unspecified: Secondary | ICD-10-CM | POA: Diagnosis not present

## 2018-11-07 DIAGNOSIS — D509 Iron deficiency anemia, unspecified: Secondary | ICD-10-CM | POA: Diagnosis not present

## 2018-11-07 DIAGNOSIS — N2581 Secondary hyperparathyroidism of renal origin: Secondary | ICD-10-CM | POA: Diagnosis not present

## 2018-11-07 DIAGNOSIS — R51 Headache: Secondary | ICD-10-CM | POA: Diagnosis not present

## 2018-11-10 DIAGNOSIS — D689 Coagulation defect, unspecified: Secondary | ICD-10-CM | POA: Diagnosis not present

## 2018-11-10 DIAGNOSIS — N186 End stage renal disease: Secondary | ICD-10-CM | POA: Diagnosis not present

## 2018-11-10 DIAGNOSIS — N2581 Secondary hyperparathyroidism of renal origin: Secondary | ICD-10-CM | POA: Diagnosis not present

## 2018-11-10 DIAGNOSIS — D509 Iron deficiency anemia, unspecified: Secondary | ICD-10-CM | POA: Diagnosis not present

## 2018-11-10 DIAGNOSIS — R52 Pain, unspecified: Secondary | ICD-10-CM | POA: Diagnosis not present

## 2018-11-10 DIAGNOSIS — E039 Hypothyroidism, unspecified: Secondary | ICD-10-CM | POA: Diagnosis not present

## 2018-11-10 DIAGNOSIS — R51 Headache: Secondary | ICD-10-CM | POA: Diagnosis not present

## 2018-11-12 DIAGNOSIS — R52 Pain, unspecified: Secondary | ICD-10-CM | POA: Diagnosis not present

## 2018-11-12 DIAGNOSIS — D509 Iron deficiency anemia, unspecified: Secondary | ICD-10-CM | POA: Diagnosis not present

## 2018-11-12 DIAGNOSIS — D689 Coagulation defect, unspecified: Secondary | ICD-10-CM | POA: Diagnosis not present

## 2018-11-12 DIAGNOSIS — N186 End stage renal disease: Secondary | ICD-10-CM | POA: Diagnosis not present

## 2018-11-12 DIAGNOSIS — R51 Headache: Secondary | ICD-10-CM | POA: Diagnosis not present

## 2018-11-12 DIAGNOSIS — E039 Hypothyroidism, unspecified: Secondary | ICD-10-CM | POA: Diagnosis not present

## 2018-11-12 DIAGNOSIS — N2581 Secondary hyperparathyroidism of renal origin: Secondary | ICD-10-CM | POA: Diagnosis not present

## 2018-11-14 DIAGNOSIS — N2581 Secondary hyperparathyroidism of renal origin: Secondary | ICD-10-CM | POA: Diagnosis not present

## 2018-11-14 DIAGNOSIS — R51 Headache: Secondary | ICD-10-CM | POA: Diagnosis not present

## 2018-11-14 DIAGNOSIS — D509 Iron deficiency anemia, unspecified: Secondary | ICD-10-CM | POA: Diagnosis not present

## 2018-11-14 DIAGNOSIS — D689 Coagulation defect, unspecified: Secondary | ICD-10-CM | POA: Diagnosis not present

## 2018-11-14 DIAGNOSIS — N186 End stage renal disease: Secondary | ICD-10-CM | POA: Diagnosis not present

## 2018-11-14 DIAGNOSIS — E039 Hypothyroidism, unspecified: Secondary | ICD-10-CM | POA: Diagnosis not present

## 2018-11-14 DIAGNOSIS — R52 Pain, unspecified: Secondary | ICD-10-CM | POA: Diagnosis not present

## 2018-11-17 DIAGNOSIS — N2581 Secondary hyperparathyroidism of renal origin: Secondary | ICD-10-CM | POA: Diagnosis not present

## 2018-11-17 DIAGNOSIS — R52 Pain, unspecified: Secondary | ICD-10-CM | POA: Diagnosis not present

## 2018-11-17 DIAGNOSIS — E039 Hypothyroidism, unspecified: Secondary | ICD-10-CM | POA: Diagnosis not present

## 2018-11-17 DIAGNOSIS — N186 End stage renal disease: Secondary | ICD-10-CM | POA: Diagnosis not present

## 2018-11-17 DIAGNOSIS — D509 Iron deficiency anemia, unspecified: Secondary | ICD-10-CM | POA: Diagnosis not present

## 2018-11-17 DIAGNOSIS — R51 Headache: Secondary | ICD-10-CM | POA: Diagnosis not present

## 2018-11-17 DIAGNOSIS — D689 Coagulation defect, unspecified: Secondary | ICD-10-CM | POA: Diagnosis not present

## 2018-11-19 DIAGNOSIS — N186 End stage renal disease: Secondary | ICD-10-CM | POA: Diagnosis not present

## 2018-11-19 DIAGNOSIS — R52 Pain, unspecified: Secondary | ICD-10-CM | POA: Diagnosis not present

## 2018-11-19 DIAGNOSIS — D689 Coagulation defect, unspecified: Secondary | ICD-10-CM | POA: Diagnosis not present

## 2018-11-19 DIAGNOSIS — R51 Headache: Secondary | ICD-10-CM | POA: Diagnosis not present

## 2018-11-19 DIAGNOSIS — D509 Iron deficiency anemia, unspecified: Secondary | ICD-10-CM | POA: Diagnosis not present

## 2018-11-19 DIAGNOSIS — E039 Hypothyroidism, unspecified: Secondary | ICD-10-CM | POA: Diagnosis not present

## 2018-11-19 DIAGNOSIS — N2581 Secondary hyperparathyroidism of renal origin: Secondary | ICD-10-CM | POA: Diagnosis not present

## 2018-11-21 DIAGNOSIS — R51 Headache: Secondary | ICD-10-CM | POA: Diagnosis not present

## 2018-11-21 DIAGNOSIS — R52 Pain, unspecified: Secondary | ICD-10-CM | POA: Diagnosis not present

## 2018-11-21 DIAGNOSIS — N2581 Secondary hyperparathyroidism of renal origin: Secondary | ICD-10-CM | POA: Diagnosis not present

## 2018-11-21 DIAGNOSIS — D689 Coagulation defect, unspecified: Secondary | ICD-10-CM | POA: Diagnosis not present

## 2018-11-21 DIAGNOSIS — D509 Iron deficiency anemia, unspecified: Secondary | ICD-10-CM | POA: Diagnosis not present

## 2018-11-21 DIAGNOSIS — Z992 Dependence on renal dialysis: Secondary | ICD-10-CM | POA: Diagnosis not present

## 2018-11-21 DIAGNOSIS — E039 Hypothyroidism, unspecified: Secondary | ICD-10-CM | POA: Diagnosis not present

## 2018-11-21 DIAGNOSIS — T8612 Kidney transplant failure: Secondary | ICD-10-CM | POA: Diagnosis not present

## 2018-11-21 DIAGNOSIS — N186 End stage renal disease: Secondary | ICD-10-CM | POA: Diagnosis not present

## 2018-11-24 DIAGNOSIS — E039 Hypothyroidism, unspecified: Secondary | ICD-10-CM | POA: Diagnosis not present

## 2018-11-24 DIAGNOSIS — M1000 Idiopathic gout, unspecified site: Secondary | ICD-10-CM | POA: Diagnosis not present

## 2018-11-24 DIAGNOSIS — D509 Iron deficiency anemia, unspecified: Secondary | ICD-10-CM | POA: Diagnosis not present

## 2018-11-24 DIAGNOSIS — D689 Coagulation defect, unspecified: Secondary | ICD-10-CM | POA: Diagnosis not present

## 2018-11-24 DIAGNOSIS — N2581 Secondary hyperparathyroidism of renal origin: Secondary | ICD-10-CM | POA: Diagnosis not present

## 2018-11-24 DIAGNOSIS — N186 End stage renal disease: Secondary | ICD-10-CM | POA: Diagnosis not present

## 2018-11-26 DIAGNOSIS — D509 Iron deficiency anemia, unspecified: Secondary | ICD-10-CM | POA: Diagnosis not present

## 2018-11-26 DIAGNOSIS — E039 Hypothyroidism, unspecified: Secondary | ICD-10-CM | POA: Diagnosis not present

## 2018-11-26 DIAGNOSIS — D689 Coagulation defect, unspecified: Secondary | ICD-10-CM | POA: Diagnosis not present

## 2018-11-26 DIAGNOSIS — M1000 Idiopathic gout, unspecified site: Secondary | ICD-10-CM | POA: Diagnosis not present

## 2018-11-26 DIAGNOSIS — N186 End stage renal disease: Secondary | ICD-10-CM | POA: Diagnosis not present

## 2018-11-26 DIAGNOSIS — N2581 Secondary hyperparathyroidism of renal origin: Secondary | ICD-10-CM | POA: Diagnosis not present

## 2018-11-27 DIAGNOSIS — M06031 Rheumatoid arthritis without rheumatoid factor, right wrist: Secondary | ICD-10-CM | POA: Diagnosis not present

## 2018-11-27 DIAGNOSIS — M65351 Trigger finger, right little finger: Secondary | ICD-10-CM | POA: Diagnosis not present

## 2018-11-27 DIAGNOSIS — M06032 Rheumatoid arthritis without rheumatoid factor, left wrist: Secondary | ICD-10-CM | POA: Diagnosis not present

## 2018-11-27 DIAGNOSIS — R2232 Localized swelling, mass and lump, left upper limb: Secondary | ICD-10-CM | POA: Diagnosis not present

## 2018-11-28 DIAGNOSIS — D509 Iron deficiency anemia, unspecified: Secondary | ICD-10-CM | POA: Diagnosis not present

## 2018-11-28 DIAGNOSIS — N186 End stage renal disease: Secondary | ICD-10-CM | POA: Diagnosis not present

## 2018-11-28 DIAGNOSIS — E039 Hypothyroidism, unspecified: Secondary | ICD-10-CM | POA: Diagnosis not present

## 2018-11-28 DIAGNOSIS — M1000 Idiopathic gout, unspecified site: Secondary | ICD-10-CM | POA: Diagnosis not present

## 2018-11-28 DIAGNOSIS — D689 Coagulation defect, unspecified: Secondary | ICD-10-CM | POA: Diagnosis not present

## 2018-11-28 DIAGNOSIS — N2581 Secondary hyperparathyroidism of renal origin: Secondary | ICD-10-CM | POA: Diagnosis not present

## 2018-12-01 DIAGNOSIS — N2581 Secondary hyperparathyroidism of renal origin: Secondary | ICD-10-CM | POA: Diagnosis not present

## 2018-12-01 DIAGNOSIS — N186 End stage renal disease: Secondary | ICD-10-CM | POA: Diagnosis not present

## 2018-12-01 DIAGNOSIS — D509 Iron deficiency anemia, unspecified: Secondary | ICD-10-CM | POA: Diagnosis not present

## 2018-12-01 DIAGNOSIS — M1000 Idiopathic gout, unspecified site: Secondary | ICD-10-CM | POA: Diagnosis not present

## 2018-12-01 DIAGNOSIS — D689 Coagulation defect, unspecified: Secondary | ICD-10-CM | POA: Diagnosis not present

## 2018-12-01 DIAGNOSIS — E039 Hypothyroidism, unspecified: Secondary | ICD-10-CM | POA: Diagnosis not present

## 2018-12-03 DIAGNOSIS — N186 End stage renal disease: Secondary | ICD-10-CM | POA: Diagnosis not present

## 2018-12-03 DIAGNOSIS — D509 Iron deficiency anemia, unspecified: Secondary | ICD-10-CM | POA: Diagnosis not present

## 2018-12-03 DIAGNOSIS — E039 Hypothyroidism, unspecified: Secondary | ICD-10-CM | POA: Diagnosis not present

## 2018-12-03 DIAGNOSIS — M1000 Idiopathic gout, unspecified site: Secondary | ICD-10-CM | POA: Diagnosis not present

## 2018-12-03 DIAGNOSIS — D689 Coagulation defect, unspecified: Secondary | ICD-10-CM | POA: Diagnosis not present

## 2018-12-03 DIAGNOSIS — N2581 Secondary hyperparathyroidism of renal origin: Secondary | ICD-10-CM | POA: Diagnosis not present

## 2018-12-05 DIAGNOSIS — E039 Hypothyroidism, unspecified: Secondary | ICD-10-CM | POA: Diagnosis not present

## 2018-12-05 DIAGNOSIS — M1000 Idiopathic gout, unspecified site: Secondary | ICD-10-CM | POA: Diagnosis not present

## 2018-12-05 DIAGNOSIS — D509 Iron deficiency anemia, unspecified: Secondary | ICD-10-CM | POA: Diagnosis not present

## 2018-12-05 DIAGNOSIS — N186 End stage renal disease: Secondary | ICD-10-CM | POA: Diagnosis not present

## 2018-12-05 DIAGNOSIS — D689 Coagulation defect, unspecified: Secondary | ICD-10-CM | POA: Diagnosis not present

## 2018-12-05 DIAGNOSIS — N2581 Secondary hyperparathyroidism of renal origin: Secondary | ICD-10-CM | POA: Diagnosis not present

## 2018-12-08 DIAGNOSIS — M1000 Idiopathic gout, unspecified site: Secondary | ICD-10-CM | POA: Diagnosis not present

## 2018-12-08 DIAGNOSIS — N186 End stage renal disease: Secondary | ICD-10-CM | POA: Diagnosis not present

## 2018-12-08 DIAGNOSIS — N2581 Secondary hyperparathyroidism of renal origin: Secondary | ICD-10-CM | POA: Diagnosis not present

## 2018-12-08 DIAGNOSIS — E039 Hypothyroidism, unspecified: Secondary | ICD-10-CM | POA: Diagnosis not present

## 2018-12-08 DIAGNOSIS — D689 Coagulation defect, unspecified: Secondary | ICD-10-CM | POA: Diagnosis not present

## 2018-12-08 DIAGNOSIS — D509 Iron deficiency anemia, unspecified: Secondary | ICD-10-CM | POA: Diagnosis not present

## 2018-12-09 ENCOUNTER — Other Ambulatory Visit: Payer: Self-pay | Admitting: Orthopedic Surgery

## 2018-12-09 DIAGNOSIS — R2232 Localized swelling, mass and lump, left upper limb: Secondary | ICD-10-CM

## 2018-12-10 DIAGNOSIS — N2581 Secondary hyperparathyroidism of renal origin: Secondary | ICD-10-CM | POA: Diagnosis not present

## 2018-12-10 DIAGNOSIS — D509 Iron deficiency anemia, unspecified: Secondary | ICD-10-CM | POA: Diagnosis not present

## 2018-12-10 DIAGNOSIS — E039 Hypothyroidism, unspecified: Secondary | ICD-10-CM | POA: Diagnosis not present

## 2018-12-10 DIAGNOSIS — N186 End stage renal disease: Secondary | ICD-10-CM | POA: Diagnosis not present

## 2018-12-10 DIAGNOSIS — M1000 Idiopathic gout, unspecified site: Secondary | ICD-10-CM | POA: Diagnosis not present

## 2018-12-10 DIAGNOSIS — D689 Coagulation defect, unspecified: Secondary | ICD-10-CM | POA: Diagnosis not present

## 2018-12-11 ENCOUNTER — Other Ambulatory Visit: Payer: Medicare Other

## 2018-12-12 DIAGNOSIS — E039 Hypothyroidism, unspecified: Secondary | ICD-10-CM | POA: Diagnosis not present

## 2018-12-12 DIAGNOSIS — D509 Iron deficiency anemia, unspecified: Secondary | ICD-10-CM | POA: Diagnosis not present

## 2018-12-12 DIAGNOSIS — D689 Coagulation defect, unspecified: Secondary | ICD-10-CM | POA: Diagnosis not present

## 2018-12-12 DIAGNOSIS — N2581 Secondary hyperparathyroidism of renal origin: Secondary | ICD-10-CM | POA: Diagnosis not present

## 2018-12-12 DIAGNOSIS — M1000 Idiopathic gout, unspecified site: Secondary | ICD-10-CM | POA: Diagnosis not present

## 2018-12-12 DIAGNOSIS — N186 End stage renal disease: Secondary | ICD-10-CM | POA: Diagnosis not present

## 2018-12-15 DIAGNOSIS — D509 Iron deficiency anemia, unspecified: Secondary | ICD-10-CM | POA: Diagnosis not present

## 2018-12-15 DIAGNOSIS — N2581 Secondary hyperparathyroidism of renal origin: Secondary | ICD-10-CM | POA: Diagnosis not present

## 2018-12-15 DIAGNOSIS — M1000 Idiopathic gout, unspecified site: Secondary | ICD-10-CM | POA: Diagnosis not present

## 2018-12-15 DIAGNOSIS — D689 Coagulation defect, unspecified: Secondary | ICD-10-CM | POA: Diagnosis not present

## 2018-12-15 DIAGNOSIS — E039 Hypothyroidism, unspecified: Secondary | ICD-10-CM | POA: Diagnosis not present

## 2018-12-15 DIAGNOSIS — N186 End stage renal disease: Secondary | ICD-10-CM | POA: Diagnosis not present

## 2018-12-17 DIAGNOSIS — N186 End stage renal disease: Secondary | ICD-10-CM | POA: Diagnosis not present

## 2018-12-17 DIAGNOSIS — E039 Hypothyroidism, unspecified: Secondary | ICD-10-CM | POA: Diagnosis not present

## 2018-12-17 DIAGNOSIS — N2581 Secondary hyperparathyroidism of renal origin: Secondary | ICD-10-CM | POA: Diagnosis not present

## 2018-12-17 DIAGNOSIS — M1000 Idiopathic gout, unspecified site: Secondary | ICD-10-CM | POA: Diagnosis not present

## 2018-12-17 DIAGNOSIS — D689 Coagulation defect, unspecified: Secondary | ICD-10-CM | POA: Diagnosis not present

## 2018-12-17 DIAGNOSIS — D509 Iron deficiency anemia, unspecified: Secondary | ICD-10-CM | POA: Diagnosis not present

## 2018-12-19 DIAGNOSIS — N2581 Secondary hyperparathyroidism of renal origin: Secondary | ICD-10-CM | POA: Diagnosis not present

## 2018-12-19 DIAGNOSIS — E039 Hypothyroidism, unspecified: Secondary | ICD-10-CM | POA: Diagnosis not present

## 2018-12-19 DIAGNOSIS — D509 Iron deficiency anemia, unspecified: Secondary | ICD-10-CM | POA: Diagnosis not present

## 2018-12-19 DIAGNOSIS — M1000 Idiopathic gout, unspecified site: Secondary | ICD-10-CM | POA: Diagnosis not present

## 2018-12-19 DIAGNOSIS — D689 Coagulation defect, unspecified: Secondary | ICD-10-CM | POA: Diagnosis not present

## 2018-12-19 DIAGNOSIS — N186 End stage renal disease: Secondary | ICD-10-CM | POA: Diagnosis not present

## 2018-12-22 DIAGNOSIS — T8612 Kidney transplant failure: Secondary | ICD-10-CM | POA: Diagnosis not present

## 2018-12-22 DIAGNOSIS — N186 End stage renal disease: Secondary | ICD-10-CM | POA: Diagnosis not present

## 2018-12-22 DIAGNOSIS — N2581 Secondary hyperparathyroidism of renal origin: Secondary | ICD-10-CM | POA: Diagnosis not present

## 2018-12-22 DIAGNOSIS — Z992 Dependence on renal dialysis: Secondary | ICD-10-CM | POA: Diagnosis not present

## 2018-12-22 DIAGNOSIS — D509 Iron deficiency anemia, unspecified: Secondary | ICD-10-CM | POA: Diagnosis not present

## 2018-12-22 DIAGNOSIS — M1000 Idiopathic gout, unspecified site: Secondary | ICD-10-CM | POA: Diagnosis not present

## 2018-12-22 DIAGNOSIS — D689 Coagulation defect, unspecified: Secondary | ICD-10-CM | POA: Diagnosis not present

## 2018-12-22 DIAGNOSIS — E039 Hypothyroidism, unspecified: Secondary | ICD-10-CM | POA: Diagnosis not present

## 2018-12-24 DIAGNOSIS — D689 Coagulation defect, unspecified: Secondary | ICD-10-CM | POA: Diagnosis not present

## 2018-12-24 DIAGNOSIS — N2581 Secondary hyperparathyroidism of renal origin: Secondary | ICD-10-CM | POA: Diagnosis not present

## 2018-12-24 DIAGNOSIS — N186 End stage renal disease: Secondary | ICD-10-CM | POA: Diagnosis not present

## 2018-12-24 DIAGNOSIS — E039 Hypothyroidism, unspecified: Secondary | ICD-10-CM | POA: Diagnosis not present

## 2018-12-24 DIAGNOSIS — E1129 Type 2 diabetes mellitus with other diabetic kidney complication: Secondary | ICD-10-CM | POA: Diagnosis not present

## 2018-12-24 DIAGNOSIS — D509 Iron deficiency anemia, unspecified: Secondary | ICD-10-CM | POA: Diagnosis not present

## 2018-12-24 DIAGNOSIS — D631 Anemia in chronic kidney disease: Secondary | ICD-10-CM | POA: Diagnosis not present

## 2018-12-24 DIAGNOSIS — R51 Headache: Secondary | ICD-10-CM | POA: Diagnosis not present

## 2018-12-26 DIAGNOSIS — E039 Hypothyroidism, unspecified: Secondary | ICD-10-CM | POA: Diagnosis not present

## 2018-12-26 DIAGNOSIS — E1129 Type 2 diabetes mellitus with other diabetic kidney complication: Secondary | ICD-10-CM | POA: Diagnosis not present

## 2018-12-26 DIAGNOSIS — D631 Anemia in chronic kidney disease: Secondary | ICD-10-CM | POA: Diagnosis not present

## 2018-12-26 DIAGNOSIS — D689 Coagulation defect, unspecified: Secondary | ICD-10-CM | POA: Diagnosis not present

## 2018-12-26 DIAGNOSIS — N2581 Secondary hyperparathyroidism of renal origin: Secondary | ICD-10-CM | POA: Diagnosis not present

## 2018-12-26 DIAGNOSIS — R51 Headache: Secondary | ICD-10-CM | POA: Diagnosis not present

## 2018-12-26 DIAGNOSIS — D509 Iron deficiency anemia, unspecified: Secondary | ICD-10-CM | POA: Diagnosis not present

## 2018-12-26 DIAGNOSIS — N186 End stage renal disease: Secondary | ICD-10-CM | POA: Diagnosis not present

## 2018-12-29 DIAGNOSIS — N186 End stage renal disease: Secondary | ICD-10-CM | POA: Diagnosis not present

## 2018-12-29 DIAGNOSIS — R51 Headache: Secondary | ICD-10-CM | POA: Diagnosis not present

## 2018-12-29 DIAGNOSIS — D509 Iron deficiency anemia, unspecified: Secondary | ICD-10-CM | POA: Diagnosis not present

## 2018-12-29 DIAGNOSIS — N2581 Secondary hyperparathyroidism of renal origin: Secondary | ICD-10-CM | POA: Diagnosis not present

## 2018-12-29 DIAGNOSIS — E1129 Type 2 diabetes mellitus with other diabetic kidney complication: Secondary | ICD-10-CM | POA: Diagnosis not present

## 2018-12-29 DIAGNOSIS — D631 Anemia in chronic kidney disease: Secondary | ICD-10-CM | POA: Diagnosis not present

## 2018-12-29 DIAGNOSIS — E039 Hypothyroidism, unspecified: Secondary | ICD-10-CM | POA: Diagnosis not present

## 2018-12-29 DIAGNOSIS — D689 Coagulation defect, unspecified: Secondary | ICD-10-CM | POA: Diagnosis not present

## 2018-12-30 DIAGNOSIS — M0579 Rheumatoid arthritis with rheumatoid factor of multiple sites without organ or systems involvement: Secondary | ICD-10-CM | POA: Diagnosis not present

## 2018-12-30 DIAGNOSIS — R5383 Other fatigue: Secondary | ICD-10-CM | POA: Diagnosis not present

## 2018-12-30 DIAGNOSIS — N186 End stage renal disease: Secondary | ICD-10-CM | POA: Diagnosis not present

## 2018-12-30 DIAGNOSIS — M2550 Pain in unspecified joint: Secondary | ICD-10-CM | POA: Diagnosis not present

## 2018-12-31 DIAGNOSIS — N186 End stage renal disease: Secondary | ICD-10-CM | POA: Diagnosis not present

## 2018-12-31 DIAGNOSIS — R51 Headache: Secondary | ICD-10-CM | POA: Diagnosis not present

## 2018-12-31 DIAGNOSIS — D689 Coagulation defect, unspecified: Secondary | ICD-10-CM | POA: Diagnosis not present

## 2018-12-31 DIAGNOSIS — E1129 Type 2 diabetes mellitus with other diabetic kidney complication: Secondary | ICD-10-CM | POA: Diagnosis not present

## 2018-12-31 DIAGNOSIS — E039 Hypothyroidism, unspecified: Secondary | ICD-10-CM | POA: Diagnosis not present

## 2018-12-31 DIAGNOSIS — D631 Anemia in chronic kidney disease: Secondary | ICD-10-CM | POA: Diagnosis not present

## 2018-12-31 DIAGNOSIS — N2581 Secondary hyperparathyroidism of renal origin: Secondary | ICD-10-CM | POA: Diagnosis not present

## 2018-12-31 DIAGNOSIS — D509 Iron deficiency anemia, unspecified: Secondary | ICD-10-CM | POA: Diagnosis not present

## 2019-01-02 DIAGNOSIS — D631 Anemia in chronic kidney disease: Secondary | ICD-10-CM | POA: Diagnosis not present

## 2019-01-02 DIAGNOSIS — E039 Hypothyroidism, unspecified: Secondary | ICD-10-CM | POA: Diagnosis not present

## 2019-01-02 DIAGNOSIS — D689 Coagulation defect, unspecified: Secondary | ICD-10-CM | POA: Diagnosis not present

## 2019-01-02 DIAGNOSIS — N2581 Secondary hyperparathyroidism of renal origin: Secondary | ICD-10-CM | POA: Diagnosis not present

## 2019-01-02 DIAGNOSIS — D509 Iron deficiency anemia, unspecified: Secondary | ICD-10-CM | POA: Diagnosis not present

## 2019-01-02 DIAGNOSIS — E1129 Type 2 diabetes mellitus with other diabetic kidney complication: Secondary | ICD-10-CM | POA: Diagnosis not present

## 2019-01-02 DIAGNOSIS — N186 End stage renal disease: Secondary | ICD-10-CM | POA: Diagnosis not present

## 2019-01-02 DIAGNOSIS — R51 Headache: Secondary | ICD-10-CM | POA: Diagnosis not present

## 2019-01-05 DIAGNOSIS — D509 Iron deficiency anemia, unspecified: Secondary | ICD-10-CM | POA: Diagnosis not present

## 2019-01-05 DIAGNOSIS — R51 Headache: Secondary | ICD-10-CM | POA: Diagnosis not present

## 2019-01-05 DIAGNOSIS — E1129 Type 2 diabetes mellitus with other diabetic kidney complication: Secondary | ICD-10-CM | POA: Diagnosis not present

## 2019-01-05 DIAGNOSIS — N186 End stage renal disease: Secondary | ICD-10-CM | POA: Diagnosis not present

## 2019-01-05 DIAGNOSIS — D689 Coagulation defect, unspecified: Secondary | ICD-10-CM | POA: Diagnosis not present

## 2019-01-05 DIAGNOSIS — E039 Hypothyroidism, unspecified: Secondary | ICD-10-CM | POA: Diagnosis not present

## 2019-01-05 DIAGNOSIS — N2581 Secondary hyperparathyroidism of renal origin: Secondary | ICD-10-CM | POA: Diagnosis not present

## 2019-01-05 DIAGNOSIS — D631 Anemia in chronic kidney disease: Secondary | ICD-10-CM | POA: Diagnosis not present

## 2019-01-07 DIAGNOSIS — D631 Anemia in chronic kidney disease: Secondary | ICD-10-CM | POA: Diagnosis not present

## 2019-01-07 DIAGNOSIS — D509 Iron deficiency anemia, unspecified: Secondary | ICD-10-CM | POA: Diagnosis not present

## 2019-01-07 DIAGNOSIS — R51 Headache: Secondary | ICD-10-CM | POA: Diagnosis not present

## 2019-01-07 DIAGNOSIS — E1129 Type 2 diabetes mellitus with other diabetic kidney complication: Secondary | ICD-10-CM | POA: Diagnosis not present

## 2019-01-07 DIAGNOSIS — N186 End stage renal disease: Secondary | ICD-10-CM | POA: Diagnosis not present

## 2019-01-07 DIAGNOSIS — N2581 Secondary hyperparathyroidism of renal origin: Secondary | ICD-10-CM | POA: Diagnosis not present

## 2019-01-07 DIAGNOSIS — D689 Coagulation defect, unspecified: Secondary | ICD-10-CM | POA: Diagnosis not present

## 2019-01-07 DIAGNOSIS — E039 Hypothyroidism, unspecified: Secondary | ICD-10-CM | POA: Diagnosis not present

## 2019-01-09 DIAGNOSIS — D631 Anemia in chronic kidney disease: Secondary | ICD-10-CM | POA: Diagnosis not present

## 2019-01-09 DIAGNOSIS — R51 Headache: Secondary | ICD-10-CM | POA: Diagnosis not present

## 2019-01-09 DIAGNOSIS — E1129 Type 2 diabetes mellitus with other diabetic kidney complication: Secondary | ICD-10-CM | POA: Diagnosis not present

## 2019-01-09 DIAGNOSIS — N186 End stage renal disease: Secondary | ICD-10-CM | POA: Diagnosis not present

## 2019-01-09 DIAGNOSIS — D509 Iron deficiency anemia, unspecified: Secondary | ICD-10-CM | POA: Diagnosis not present

## 2019-01-09 DIAGNOSIS — N2581 Secondary hyperparathyroidism of renal origin: Secondary | ICD-10-CM | POA: Diagnosis not present

## 2019-01-09 DIAGNOSIS — D689 Coagulation defect, unspecified: Secondary | ICD-10-CM | POA: Diagnosis not present

## 2019-01-09 DIAGNOSIS — E039 Hypothyroidism, unspecified: Secondary | ICD-10-CM | POA: Diagnosis not present

## 2019-01-12 DIAGNOSIS — E1129 Type 2 diabetes mellitus with other diabetic kidney complication: Secondary | ICD-10-CM | POA: Diagnosis not present

## 2019-01-12 DIAGNOSIS — N2581 Secondary hyperparathyroidism of renal origin: Secondary | ICD-10-CM | POA: Diagnosis not present

## 2019-01-12 DIAGNOSIS — D689 Coagulation defect, unspecified: Secondary | ICD-10-CM | POA: Diagnosis not present

## 2019-01-12 DIAGNOSIS — R51 Headache: Secondary | ICD-10-CM | POA: Diagnosis not present

## 2019-01-12 DIAGNOSIS — D631 Anemia in chronic kidney disease: Secondary | ICD-10-CM | POA: Diagnosis not present

## 2019-01-12 DIAGNOSIS — N186 End stage renal disease: Secondary | ICD-10-CM | POA: Diagnosis not present

## 2019-01-12 DIAGNOSIS — E039 Hypothyroidism, unspecified: Secondary | ICD-10-CM | POA: Diagnosis not present

## 2019-01-12 DIAGNOSIS — D509 Iron deficiency anemia, unspecified: Secondary | ICD-10-CM | POA: Diagnosis not present

## 2019-01-14 DIAGNOSIS — D509 Iron deficiency anemia, unspecified: Secondary | ICD-10-CM | POA: Diagnosis not present

## 2019-01-14 DIAGNOSIS — N186 End stage renal disease: Secondary | ICD-10-CM | POA: Diagnosis not present

## 2019-01-14 DIAGNOSIS — R51 Headache: Secondary | ICD-10-CM | POA: Diagnosis not present

## 2019-01-14 DIAGNOSIS — D689 Coagulation defect, unspecified: Secondary | ICD-10-CM | POA: Diagnosis not present

## 2019-01-14 DIAGNOSIS — E039 Hypothyroidism, unspecified: Secondary | ICD-10-CM | POA: Diagnosis not present

## 2019-01-14 DIAGNOSIS — D631 Anemia in chronic kidney disease: Secondary | ICD-10-CM | POA: Diagnosis not present

## 2019-01-14 DIAGNOSIS — N2581 Secondary hyperparathyroidism of renal origin: Secondary | ICD-10-CM | POA: Diagnosis not present

## 2019-01-14 DIAGNOSIS — E1129 Type 2 diabetes mellitus with other diabetic kidney complication: Secondary | ICD-10-CM | POA: Diagnosis not present

## 2019-01-16 DIAGNOSIS — D631 Anemia in chronic kidney disease: Secondary | ICD-10-CM | POA: Diagnosis not present

## 2019-01-16 DIAGNOSIS — N186 End stage renal disease: Secondary | ICD-10-CM | POA: Diagnosis not present

## 2019-01-16 DIAGNOSIS — D509 Iron deficiency anemia, unspecified: Secondary | ICD-10-CM | POA: Diagnosis not present

## 2019-01-16 DIAGNOSIS — N2581 Secondary hyperparathyroidism of renal origin: Secondary | ICD-10-CM | POA: Diagnosis not present

## 2019-01-16 DIAGNOSIS — D689 Coagulation defect, unspecified: Secondary | ICD-10-CM | POA: Diagnosis not present

## 2019-01-16 DIAGNOSIS — E1129 Type 2 diabetes mellitus with other diabetic kidney complication: Secondary | ICD-10-CM | POA: Diagnosis not present

## 2019-01-16 DIAGNOSIS — E039 Hypothyroidism, unspecified: Secondary | ICD-10-CM | POA: Diagnosis not present

## 2019-01-16 DIAGNOSIS — R51 Headache: Secondary | ICD-10-CM | POA: Diagnosis not present

## 2019-01-19 DIAGNOSIS — R51 Headache: Secondary | ICD-10-CM | POA: Diagnosis not present

## 2019-01-19 DIAGNOSIS — D509 Iron deficiency anemia, unspecified: Secondary | ICD-10-CM | POA: Diagnosis not present

## 2019-01-19 DIAGNOSIS — N186 End stage renal disease: Secondary | ICD-10-CM | POA: Diagnosis not present

## 2019-01-19 DIAGNOSIS — E1129 Type 2 diabetes mellitus with other diabetic kidney complication: Secondary | ICD-10-CM | POA: Diagnosis not present

## 2019-01-19 DIAGNOSIS — D689 Coagulation defect, unspecified: Secondary | ICD-10-CM | POA: Diagnosis not present

## 2019-01-19 DIAGNOSIS — N2581 Secondary hyperparathyroidism of renal origin: Secondary | ICD-10-CM | POA: Diagnosis not present

## 2019-01-19 DIAGNOSIS — E039 Hypothyroidism, unspecified: Secondary | ICD-10-CM | POA: Diagnosis not present

## 2019-01-19 DIAGNOSIS — D631 Anemia in chronic kidney disease: Secondary | ICD-10-CM | POA: Diagnosis not present

## 2019-01-21 DIAGNOSIS — E1129 Type 2 diabetes mellitus with other diabetic kidney complication: Secondary | ICD-10-CM | POA: Diagnosis not present

## 2019-01-21 DIAGNOSIS — E039 Hypothyroidism, unspecified: Secondary | ICD-10-CM | POA: Diagnosis not present

## 2019-01-21 DIAGNOSIS — N186 End stage renal disease: Secondary | ICD-10-CM | POA: Diagnosis not present

## 2019-01-21 DIAGNOSIS — D509 Iron deficiency anemia, unspecified: Secondary | ICD-10-CM | POA: Diagnosis not present

## 2019-01-21 DIAGNOSIS — T8612 Kidney transplant failure: Secondary | ICD-10-CM | POA: Diagnosis not present

## 2019-01-21 DIAGNOSIS — R51 Headache: Secondary | ICD-10-CM | POA: Diagnosis not present

## 2019-01-21 DIAGNOSIS — D689 Coagulation defect, unspecified: Secondary | ICD-10-CM | POA: Diagnosis not present

## 2019-01-21 DIAGNOSIS — N2581 Secondary hyperparathyroidism of renal origin: Secondary | ICD-10-CM | POA: Diagnosis not present

## 2019-01-21 DIAGNOSIS — D631 Anemia in chronic kidney disease: Secondary | ICD-10-CM | POA: Diagnosis not present

## 2019-01-21 DIAGNOSIS — Z992 Dependence on renal dialysis: Secondary | ICD-10-CM | POA: Diagnosis not present

## 2019-01-23 DIAGNOSIS — D631 Anemia in chronic kidney disease: Secondary | ICD-10-CM | POA: Diagnosis not present

## 2019-01-23 DIAGNOSIS — E039 Hypothyroidism, unspecified: Secondary | ICD-10-CM | POA: Diagnosis not present

## 2019-01-23 DIAGNOSIS — D689 Coagulation defect, unspecified: Secondary | ICD-10-CM | POA: Diagnosis not present

## 2019-01-23 DIAGNOSIS — R509 Fever, unspecified: Secondary | ICD-10-CM | POA: Diagnosis not present

## 2019-01-23 DIAGNOSIS — N186 End stage renal disease: Secondary | ICD-10-CM | POA: Diagnosis not present

## 2019-01-23 DIAGNOSIS — R52 Pain, unspecified: Secondary | ICD-10-CM | POA: Diagnosis not present

## 2019-01-23 DIAGNOSIS — R51 Headache: Secondary | ICD-10-CM | POA: Diagnosis not present

## 2019-01-23 DIAGNOSIS — N2581 Secondary hyperparathyroidism of renal origin: Secondary | ICD-10-CM | POA: Diagnosis not present

## 2019-01-23 DIAGNOSIS — D509 Iron deficiency anemia, unspecified: Secondary | ICD-10-CM | POA: Diagnosis not present

## 2019-01-26 DIAGNOSIS — R51 Headache: Secondary | ICD-10-CM | POA: Diagnosis not present

## 2019-01-26 DIAGNOSIS — R509 Fever, unspecified: Secondary | ICD-10-CM | POA: Diagnosis not present

## 2019-01-26 DIAGNOSIS — D631 Anemia in chronic kidney disease: Secondary | ICD-10-CM | POA: Diagnosis not present

## 2019-01-26 DIAGNOSIS — D509 Iron deficiency anemia, unspecified: Secondary | ICD-10-CM | POA: Diagnosis not present

## 2019-01-26 DIAGNOSIS — E039 Hypothyroidism, unspecified: Secondary | ICD-10-CM | POA: Diagnosis not present

## 2019-01-26 DIAGNOSIS — N2581 Secondary hyperparathyroidism of renal origin: Secondary | ICD-10-CM | POA: Diagnosis not present

## 2019-01-26 DIAGNOSIS — D689 Coagulation defect, unspecified: Secondary | ICD-10-CM | POA: Diagnosis not present

## 2019-01-26 DIAGNOSIS — R52 Pain, unspecified: Secondary | ICD-10-CM | POA: Diagnosis not present

## 2019-01-26 DIAGNOSIS — N186 End stage renal disease: Secondary | ICD-10-CM | POA: Diagnosis not present

## 2019-01-28 DIAGNOSIS — D689 Coagulation defect, unspecified: Secondary | ICD-10-CM | POA: Diagnosis not present

## 2019-01-28 DIAGNOSIS — R51 Headache: Secondary | ICD-10-CM | POA: Diagnosis not present

## 2019-01-28 DIAGNOSIS — D509 Iron deficiency anemia, unspecified: Secondary | ICD-10-CM | POA: Diagnosis not present

## 2019-01-28 DIAGNOSIS — N2581 Secondary hyperparathyroidism of renal origin: Secondary | ICD-10-CM | POA: Diagnosis not present

## 2019-01-28 DIAGNOSIS — R509 Fever, unspecified: Secondary | ICD-10-CM | POA: Diagnosis not present

## 2019-01-28 DIAGNOSIS — D631 Anemia in chronic kidney disease: Secondary | ICD-10-CM | POA: Diagnosis not present

## 2019-01-28 DIAGNOSIS — R52 Pain, unspecified: Secondary | ICD-10-CM | POA: Diagnosis not present

## 2019-01-28 DIAGNOSIS — N186 End stage renal disease: Secondary | ICD-10-CM | POA: Diagnosis not present

## 2019-01-28 DIAGNOSIS — E039 Hypothyroidism, unspecified: Secondary | ICD-10-CM | POA: Diagnosis not present

## 2019-01-30 DIAGNOSIS — D689 Coagulation defect, unspecified: Secondary | ICD-10-CM | POA: Diagnosis not present

## 2019-01-30 DIAGNOSIS — R52 Pain, unspecified: Secondary | ICD-10-CM | POA: Diagnosis not present

## 2019-01-30 DIAGNOSIS — E039 Hypothyroidism, unspecified: Secondary | ICD-10-CM | POA: Diagnosis not present

## 2019-01-30 DIAGNOSIS — N186 End stage renal disease: Secondary | ICD-10-CM | POA: Diagnosis not present

## 2019-01-30 DIAGNOSIS — R509 Fever, unspecified: Secondary | ICD-10-CM | POA: Diagnosis not present

## 2019-01-30 DIAGNOSIS — D509 Iron deficiency anemia, unspecified: Secondary | ICD-10-CM | POA: Diagnosis not present

## 2019-01-30 DIAGNOSIS — D631 Anemia in chronic kidney disease: Secondary | ICD-10-CM | POA: Diagnosis not present

## 2019-01-30 DIAGNOSIS — R51 Headache: Secondary | ICD-10-CM | POA: Diagnosis not present

## 2019-01-30 DIAGNOSIS — N2581 Secondary hyperparathyroidism of renal origin: Secondary | ICD-10-CM | POA: Diagnosis not present

## 2019-02-01 DIAGNOSIS — M0579 Rheumatoid arthritis with rheumatoid factor of multiple sites without organ or systems involvement: Secondary | ICD-10-CM | POA: Diagnosis not present

## 2019-02-01 DIAGNOSIS — R918 Other nonspecific abnormal finding of lung field: Secondary | ICD-10-CM | POA: Diagnosis not present

## 2019-02-01 DIAGNOSIS — A319 Mycobacterial infection, unspecified: Secondary | ICD-10-CM | POA: Diagnosis not present

## 2019-02-01 DIAGNOSIS — N186 End stage renal disease: Secondary | ICD-10-CM | POA: Diagnosis not present

## 2019-02-02 DIAGNOSIS — N186 End stage renal disease: Secondary | ICD-10-CM | POA: Diagnosis not present

## 2019-02-02 DIAGNOSIS — N2581 Secondary hyperparathyroidism of renal origin: Secondary | ICD-10-CM | POA: Diagnosis not present

## 2019-02-02 DIAGNOSIS — E039 Hypothyroidism, unspecified: Secondary | ICD-10-CM | POA: Diagnosis not present

## 2019-02-02 DIAGNOSIS — D631 Anemia in chronic kidney disease: Secondary | ICD-10-CM | POA: Diagnosis not present

## 2019-02-02 DIAGNOSIS — R51 Headache: Secondary | ICD-10-CM | POA: Diagnosis not present

## 2019-02-02 DIAGNOSIS — D689 Coagulation defect, unspecified: Secondary | ICD-10-CM | POA: Diagnosis not present

## 2019-02-02 DIAGNOSIS — R52 Pain, unspecified: Secondary | ICD-10-CM | POA: Diagnosis not present

## 2019-02-02 DIAGNOSIS — R509 Fever, unspecified: Secondary | ICD-10-CM | POA: Diagnosis not present

## 2019-02-02 DIAGNOSIS — D509 Iron deficiency anemia, unspecified: Secondary | ICD-10-CM | POA: Diagnosis not present

## 2019-02-04 DIAGNOSIS — R509 Fever, unspecified: Secondary | ICD-10-CM | POA: Diagnosis not present

## 2019-02-04 DIAGNOSIS — E039 Hypothyroidism, unspecified: Secondary | ICD-10-CM | POA: Diagnosis not present

## 2019-02-04 DIAGNOSIS — R51 Headache: Secondary | ICD-10-CM | POA: Diagnosis not present

## 2019-02-04 DIAGNOSIS — N186 End stage renal disease: Secondary | ICD-10-CM | POA: Diagnosis not present

## 2019-02-04 DIAGNOSIS — D509 Iron deficiency anemia, unspecified: Secondary | ICD-10-CM | POA: Diagnosis not present

## 2019-02-04 DIAGNOSIS — N2581 Secondary hyperparathyroidism of renal origin: Secondary | ICD-10-CM | POA: Diagnosis not present

## 2019-02-04 DIAGNOSIS — R52 Pain, unspecified: Secondary | ICD-10-CM | POA: Diagnosis not present

## 2019-02-04 DIAGNOSIS — D689 Coagulation defect, unspecified: Secondary | ICD-10-CM | POA: Diagnosis not present

## 2019-02-04 DIAGNOSIS — D631 Anemia in chronic kidney disease: Secondary | ICD-10-CM | POA: Diagnosis not present

## 2019-02-05 ENCOUNTER — Other Ambulatory Visit: Payer: Self-pay

## 2019-02-05 ENCOUNTER — Inpatient Hospital Stay (HOSPITAL_COMMUNITY)
Admission: EM | Admit: 2019-02-05 | Discharge: 2019-02-09 | DRG: 469 | Disposition: A | Discharge status: Home Health | Payer: Medicare Other | Attending: Internal Medicine | Admitting: Internal Medicine

## 2019-02-05 ENCOUNTER — Inpatient Hospital Stay (HOSPITAL_COMMUNITY): Payer: Medicare Other

## 2019-02-05 ENCOUNTER — Emergency Department (HOSPITAL_COMMUNITY): Payer: Medicare Other

## 2019-02-05 ENCOUNTER — Encounter (HOSPITAL_COMMUNITY): Payer: Self-pay | Admitting: Emergency Medicine

## 2019-02-05 DIAGNOSIS — Z96642 Presence of left artificial hip joint: Secondary | ICD-10-CM

## 2019-02-05 DIAGNOSIS — N2581 Secondary hyperparathyroidism of renal origin: Secondary | ICD-10-CM | POA: Diagnosis not present

## 2019-02-05 DIAGNOSIS — E039 Hypothyroidism, unspecified: Secondary | ICD-10-CM | POA: Diagnosis not present

## 2019-02-05 DIAGNOSIS — Z7982 Long term (current) use of aspirin: Secondary | ICD-10-CM | POA: Diagnosis not present

## 2019-02-05 DIAGNOSIS — S72012A Unspecified intracapsular fracture of left femur, initial encounter for closed fracture: Secondary | ICD-10-CM | POA: Diagnosis not present

## 2019-02-05 DIAGNOSIS — Z888 Allergy status to other drugs, medicaments and biological substances status: Secondary | ICD-10-CM | POA: Diagnosis not present

## 2019-02-05 DIAGNOSIS — Z823 Family history of stroke: Secondary | ICD-10-CM | POA: Diagnosis not present

## 2019-02-05 DIAGNOSIS — S72002A Fracture of unspecified part of neck of left femur, initial encounter for closed fracture: Secondary | ICD-10-CM | POA: Diagnosis present

## 2019-02-05 DIAGNOSIS — W06XXXA Fall from bed, initial encounter: Secondary | ICD-10-CM | POA: Diagnosis present

## 2019-02-05 DIAGNOSIS — Z8673 Personal history of transient ischemic attack (TIA), and cerebral infarction without residual deficits: Secondary | ICD-10-CM

## 2019-02-05 DIAGNOSIS — E8889 Other specified metabolic disorders: Secondary | ICD-10-CM | POA: Diagnosis not present

## 2019-02-05 DIAGNOSIS — Z8249 Family history of ischemic heart disease and other diseases of the circulatory system: Secondary | ICD-10-CM

## 2019-02-05 DIAGNOSIS — R34 Anuria and oliguria: Secondary | ICD-10-CM | POA: Diagnosis not present

## 2019-02-05 DIAGNOSIS — K219 Gastro-esophageal reflux disease without esophagitis: Secondary | ICD-10-CM | POA: Diagnosis present

## 2019-02-05 DIAGNOSIS — I120 Hypertensive chronic kidney disease with stage 5 chronic kidney disease or end stage renal disease: Secondary | ICD-10-CM | POA: Diagnosis not present

## 2019-02-05 DIAGNOSIS — Z992 Dependence on renal dialysis: Secondary | ICD-10-CM

## 2019-02-05 DIAGNOSIS — T8611 Kidney transplant rejection: Secondary | ICD-10-CM | POA: Diagnosis present

## 2019-02-05 DIAGNOSIS — Z79890 Hormone replacement therapy (postmenopausal): Secondary | ICD-10-CM

## 2019-02-05 DIAGNOSIS — E611 Iron deficiency: Secondary | ICD-10-CM | POA: Diagnosis not present

## 2019-02-05 DIAGNOSIS — S72042A Displaced fracture of base of neck of left femur, initial encounter for closed fracture: Secondary | ICD-10-CM | POA: Diagnosis not present

## 2019-02-05 DIAGNOSIS — D631 Anemia in chronic kidney disease: Secondary | ICD-10-CM | POA: Diagnosis not present

## 2019-02-05 DIAGNOSIS — Z1159 Encounter for screening for other viral diseases: Secondary | ICD-10-CM

## 2019-02-05 DIAGNOSIS — N186 End stage renal disease: Secondary | ICD-10-CM | POA: Diagnosis not present

## 2019-02-05 DIAGNOSIS — Z79899 Other long term (current) drug therapy: Secondary | ICD-10-CM

## 2019-02-05 DIAGNOSIS — Z825 Family history of asthma and other chronic lower respiratory diseases: Secondary | ICD-10-CM

## 2019-02-05 DIAGNOSIS — J45909 Unspecified asthma, uncomplicated: Secondary | ICD-10-CM | POA: Diagnosis not present

## 2019-02-05 DIAGNOSIS — Z833 Family history of diabetes mellitus: Secondary | ICD-10-CM

## 2019-02-05 DIAGNOSIS — Z882 Allergy status to sulfonamides status: Secondary | ICD-10-CM | POA: Diagnosis not present

## 2019-02-05 DIAGNOSIS — W19XXXA Unspecified fall, initial encounter: Secondary | ICD-10-CM | POA: Diagnosis not present

## 2019-02-05 DIAGNOSIS — E119 Type 2 diabetes mellitus without complications: Secondary | ICD-10-CM | POA: Diagnosis not present

## 2019-02-05 DIAGNOSIS — Z8041 Family history of malignant neoplasm of ovary: Secondary | ICD-10-CM

## 2019-02-05 DIAGNOSIS — Y92013 Bedroom of single-family (private) house as the place of occurrence of the external cause: Secondary | ICD-10-CM

## 2019-02-05 DIAGNOSIS — T8612 Kidney transplant failure: Secondary | ICD-10-CM | POA: Diagnosis present

## 2019-02-05 DIAGNOSIS — Z471 Aftercare following joint replacement surgery: Secondary | ICD-10-CM | POA: Diagnosis not present

## 2019-02-05 DIAGNOSIS — Y830 Surgical operation with transplant of whole organ as the cause of abnormal reaction of the patient, or of later complication, without mention of misadventure at the time of the procedure: Secondary | ICD-10-CM | POA: Diagnosis present

## 2019-02-05 DIAGNOSIS — I10 Essential (primary) hypertension: Secondary | ICD-10-CM | POA: Diagnosis not present

## 2019-02-05 DIAGNOSIS — Z7951 Long term (current) use of inhaled steroids: Secondary | ICD-10-CM

## 2019-02-05 DIAGNOSIS — R52 Pain, unspecified: Secondary | ICD-10-CM | POA: Diagnosis not present

## 2019-02-05 DIAGNOSIS — Z03818 Encounter for observation for suspected exposure to other biological agents ruled out: Secondary | ICD-10-CM | POA: Diagnosis not present

## 2019-02-05 LAB — CBC WITH DIFFERENTIAL/PLATELET
Abs Immature Granulocytes: 0.04 10*3/uL (ref 0.00–0.07)
Basophils Absolute: 0 10*3/uL (ref 0.0–0.1)
Basophils Relative: 0 %
Eosinophils Absolute: 0.1 10*3/uL (ref 0.0–0.5)
Eosinophils Relative: 2 %
HCT: 37.4 % (ref 36.0–46.0)
Hemoglobin: 12.1 g/dL (ref 12.0–15.0)
Immature Granulocytes: 1 %
Lymphocytes Relative: 9 %
Lymphs Abs: 0.7 10*3/uL (ref 0.7–4.0)
MCH: 31.5 pg (ref 26.0–34.0)
MCHC: 32.4 g/dL (ref 30.0–36.0)
MCV: 97.4 fL (ref 80.0–100.0)
Monocytes Absolute: 0.5 10*3/uL (ref 0.1–1.0)
Monocytes Relative: 6 %
Neutro Abs: 6.2 10*3/uL (ref 1.7–7.7)
Neutrophils Relative %: 82 %
Platelets: 206 10*3/uL (ref 150–400)
RBC: 3.84 MIL/uL — ABNORMAL LOW (ref 3.87–5.11)
RDW: 14.2 % (ref 11.5–15.5)
WBC: 7.6 10*3/uL (ref 4.0–10.5)
nRBC: 0 % (ref 0.0–0.2)

## 2019-02-05 LAB — COMPREHENSIVE METABOLIC PANEL
ALT: 12 U/L (ref 0–44)
AST: 14 U/L — ABNORMAL LOW (ref 15–41)
Albumin: 3.3 g/dL — ABNORMAL LOW (ref 3.5–5.0)
Alkaline Phosphatase: 196 U/L — ABNORMAL HIGH (ref 38–126)
Anion gap: 14 (ref 5–15)
BUN: 47 mg/dL — ABNORMAL HIGH (ref 8–23)
CO2: 29 mmol/L (ref 22–32)
Calcium: 9.5 mg/dL (ref 8.9–10.3)
Chloride: 95 mmol/L — ABNORMAL LOW (ref 98–111)
Creatinine, Ser: 6.72 mg/dL — ABNORMAL HIGH (ref 0.44–1.00)
GFR calc Af Amer: 7 mL/min — ABNORMAL LOW (ref >60)
GFR calc non Af Amer: 6 mL/min — ABNORMAL LOW (ref >60)
Glucose, Bld: 108 mg/dL — ABNORMAL HIGH (ref 70–99)
Potassium: 4.3 mmol/L (ref 3.5–5.1)
Sodium: 138 mmol/L (ref 135–145)
Total Bilirubin: 0.7 mg/dL (ref 0.3–1.2)
Total Protein: 6.6 g/dL (ref 6.5–8.1)

## 2019-02-05 LAB — TYPE AND SCREEN
ABO/RH(D): B POS
Antibody Screen: NEGATIVE

## 2019-02-05 LAB — SARS CORONAVIRUS 2 BY RT PCR (HOSPITAL ORDER, PERFORMED IN ~~LOC~~ HOSPITAL LAB): SARS Coronavirus 2: NEGATIVE

## 2019-02-05 MED ORDER — AMLODIPINE BESYLATE 5 MG PO TABS
5.0000 mg | ORAL_TABLET | Freq: Every day | ORAL | Status: DC
  Administered 2019-02-05: 5 mg via ORAL
  Filled 2019-02-05: qty 1

## 2019-02-05 MED ORDER — ALBUTEROL SULFATE (2.5 MG/3ML) 0.083% IN NEBU: 3.0000 mL | INHALATION_SOLUTION | Freq: Four times a day (QID) | RESPIRATORY_TRACT | Status: DC | PRN

## 2019-02-05 MED ORDER — CINACALCET HCL 30 MG PO TABS
30.0000 mg | ORAL_TABLET | Freq: Every day | ORAL | Status: DC
  Administered 2019-02-08: 18:00:00 30 mg via ORAL
  Filled 2019-02-05 (×4): qty 1

## 2019-02-05 MED ORDER — CHLORHEXIDINE GLUCONATE CLOTH 2 % EX PADS
6.0000 | MEDICATED_PAD | Freq: Every day | CUTANEOUS | Status: DC
  Administered 2019-02-06 – 2019-02-08 (×2): 6 via TOPICAL

## 2019-02-05 MED ORDER — FLUTICASONE FUROATE-VILANTEROL 200-25 MCG/INH IN AEPB
1.0000 | INHALATION_SPRAY | Freq: Every evening | RESPIRATORY_TRACT | Status: DC
  Administered 2019-02-07 – 2019-02-08 (×2): 1 via RESPIRATORY_TRACT
  Filled 2019-02-05: qty 28

## 2019-02-05 MED ORDER — CALCITRIOL 0.25 MCG PO CAPS
0.2500 ug | ORAL_CAPSULE | ORAL | Status: DC
  Administered 2019-02-07 – 2019-02-09 (×2): 0.25 ug via ORAL
  Filled 2019-02-05 (×2): qty 1

## 2019-02-05 MED ORDER — TIOTROPIUM BROMIDE MONOHYDRATE 1.25 MCG/ACT IN AERS: 2.0000 | INHALATION_SPRAY | Freq: Every day | RESPIRATORY_TRACT | Status: DC

## 2019-02-05 MED ORDER — HYDROCODONE-ACETAMINOPHEN 5-325 MG PO TABS
1.0000 | ORAL_TABLET | Freq: Four times a day (QID) | ORAL | Status: DC | PRN
  Administered 2019-02-05 – 2019-02-06 (×4): 2 via ORAL
  Administered 2019-02-07: 1 via ORAL
  Administered 2019-02-07 – 2019-02-09 (×6): 2 via ORAL
  Filled 2019-02-05 (×11): qty 2

## 2019-02-05 MED ORDER — LEVOTHYROXINE SODIUM 50 MCG PO TABS: 50.0000 ug | ORAL_TABLET | ORAL | Status: DC

## 2019-02-05 MED ORDER — MORPHINE SULFATE (PF) 4 MG/ML IV SOLN: 4.0000 mg | Freq: Once | INTRAVENOUS | Status: DC

## 2019-02-05 MED ORDER — SENNOSIDES-DOCUSATE SODIUM 8.6-50 MG PO TABS: 1.0000 | ORAL_TABLET | Freq: Every evening | ORAL | Status: DC | PRN

## 2019-02-05 MED ORDER — FENTANYL CITRATE (PF) 100 MCG/2ML IJ SOLN
25.0000 ug | Freq: Once | INTRAMUSCULAR | Status: AC
  Administered 2019-02-05: 25 ug via INTRAVENOUS
  Filled 2019-02-05: qty 2

## 2019-02-05 MED ORDER — UMECLIDINIUM BROMIDE 62.5 MCG/INH IN AEPB
1.0000 | INHALATION_SPRAY | Freq: Every day | RESPIRATORY_TRACT | Status: DC
  Administered 2019-02-07 – 2019-02-08 (×2): 1 via RESPIRATORY_TRACT
  Filled 2019-02-05: qty 7

## 2019-02-05 MED ORDER — HYDROMORPHONE HCL 1 MG/ML IJ SOLN
0.5000 mg | INTRAMUSCULAR | Status: DC | PRN
  Administered 2019-02-05 – 2019-02-06 (×3): 0.5 mg via INTRAVENOUS
  Filled 2019-02-05 (×3): qty 1

## 2019-02-05 MED ORDER — LEVOTHYROXINE SODIUM 75 MCG PO TABS: 75.0000 ug | ORAL_TABLET | ORAL | Status: DC

## 2019-02-05 MED ORDER — LEVOTHYROXINE SODIUM 50 MCG PO TABS
50.0000 ug | ORAL_TABLET | ORAL | Status: DC
  Administered 2019-02-06 – 2019-02-08 (×3): 50 ug via ORAL
  Filled 2019-02-05 (×3): qty 1

## 2019-02-05 NOTE — ED Notes (Signed)
ED TO INPATIENT HANDOFF REPORT  ED Nurse Name and Phone #: [phone]  S Name/Age/Gender Kimberly [name] 63 y.o. female Room/Bed: 041C/041C  Code Status   Code Status: Prior  Home/SNF/Other Home Patient oriented to: self, place, time and situationAXO X4 Is this baseline? Yes   Triage Complete: Triage complete  Chief Complaint pain in left thigh groin hip  Triage Note Patient arrived [facility] with complaints of pain to left hip, upper leg (femue area) post fall from bed. Active ROM ,a ble to move all extremeties. Denise hitting head or LOC. Denies chest pain, shortness of breath, or pain in other areas. Abrasion to left clavicle area post.    Allergies Allergies  Allergen Reactions  . Mircera [Methoxy Polyethylene Glycol-Epoetin Beta] Other (See Comments)    Severe joint and muscle pain  . Dapsone Rash and Other (See Comments)    Pain in feet  . Pregabalin Rash and Other (See Comments)    fever  . Sulfonamide Derivatives Rash  . Tramadol Rash and Other (See Comments)    Rash on cheek    Level of Care/Admitting Diagnosis ED Disposition    ED Disposition Condition [facility] Area: [place] [[id number]]  Level of Care: Med-Surg [16]  Covid Evaluation: Screening Protocol (No Symptoms)  Diagnosis: Closed left hip fracture, initial encounter [facility]) [[id number]]  Admitting Physician: Lenore Cordia [[id number]]  Attending Physician: Lenore Cordia [[id number]]  Estimated length of stay: past midnight tomorrow  Certification:: I certify this patient will need inpatient services for at least 2 midnights  PT Class (Do Not Modify): Inpatient [101]  PT Acc Code (Do Not Modify): Private [1]       B Medical/Surgery History Past Medical History:  Diagnosis Date  . Anemia   . Asthma   . Complication of anesthesia    Slow to awaken and AMS- took 1 month to memory and recall to return  . Diabetes mellitus    medication induced with  steroids , 05/19/18  - last A1C was 4.something  . Diverticulosis   . Dyspnea    with exertion. when walking up stairs or walking a distance  . First degree AV block   . GERD (gastroesophageal reflux disease)   . Heart murmur    child  . History of blood transfusion   . History of chronic cough   . History of hiatal hernia   . History of hoarseness   . Hypertension    history; resolved on dialysis  . Hypothyroidism   . IgA nephropathy    ESRD - Hemo TTH Sat  . Immunosuppression ([place])   . Kidney transplanted 01/2010   [facility], has had some rejection  . Patient on peritoneal dialysis ([place])   . Pneumonia   . Premature atrial beat   . Premature ventricular beat   . Renal transplant failure and rejection 2012  . Shingles 03/2011  . Stroke [facility]) October 2010, March 2013   cerebral aneurysm, 07/2017   Past Surgical History:  Procedure Laterality Date  . AV FISTULA PLACEMENT Left 05/20/2018   Procedure: INSERTION OF ARTERIOVENOUS (AV) GORE-TEX GRAFT LEFT  ARM;  Surgeon: [name], MD;  Location: [place];  Service: Vascular;  Laterality: Left;  . CAPD INSERTION  08/25/2008   open, Dr [name]  . CAPD INSERTION N/A 12/14/2013   Procedure: LAPAROSCOPIC INSERTION CONTINUOUS AMBULATORY PERITONEAL DIALYSIS  (CAPD) CATHETER;  Surgeon: [name], MD;  Location: [place];  Service:  General;  Laterality: N/A;  . CAPD REMOVAL  03/22/2010   Dr [name]  . COLONOSCOPY    . ESOPHAGOGASTRODUODENOSCOPY    . EYE SURGERY Bilateral    radial keratotomy  . Hemodialysis catheter Right 02/13/2018  . INSERTION OF MESH N/A 12/14/2013   Procedure: INSERTION OF MESH;  Surgeon: [name], MD;  Location: [place];  Service: General;  Laterality: N/A;  . KIDNEY TRANSPLANT Right 01/2010   [place]  . LAPAROSCOPIC LYSIS OF ADHESIONS N/A 12/14/2013   Procedure: LAPAROSCOPIC LYSIS OF ADHESIONS;  Surgeon: [name], MD;  Location: [place];  Service: General;  Laterality: N/A;  . UMBILICAL HERNIA REPAIR  10/25/2009    open with mesh,  Dr [name]  . URETER REVISION  04/2011   [facility]  . VENTRAL HERNIA REPAIR N/A 12/14/2013   Procedure: LAPAROSCOPIC VENTRAL HERNIA;  Surgeon: [name], MD;  Location: [place];  Service: General;  Laterality: N/A;  . VIDEO ASSISTED THORACOSCOPY (VATS)/WEDGE RESECTION Left 09/01/2014   Procedure: VIDEO ASSISTED THORACOSCOPY (VATS)/WEDGE RESECTION;  Surgeon: [name], MD;  Location: [place];  Service: Thoracic;  Laterality: Left;  Marland Kitchen VIDEO BRONCHOSCOPY Bilateral 07/05/2014   Procedure: VIDEO BRONCHOSCOPY WITH FLUORO;  Surgeon: [name], MD;  Location: [facility];  Service: Cardiopulmonary;  Laterality: Bilateral;     A IV Location/Drains/Wounds Patient Lines/Drains/Airways Status   Active Line/Drains/Airways    Name:   Placement date:   Placement time:   Site:   Days:   Peripheral IV 02/05/19 Right Antecubital   02/05/19    -    Antecubital   less than 1   Fistula / Graft Left Upper arm Arteriovenous vein graft   05/20/18    1140    Upper arm   261   Incision (Closed) 12/14/13 Abdomen Other (Comment)   12/14/13    1402     1879   Incision (Closed) 09/01/14 Chest Left   09/01/14    1351     1618   Incision (Closed) 05/20/18 Arm Left   05/20/18    1145     261   Incision (Closed) 05/20/18 Axilla Left   05/20/18    1145     261   Incision - 3 Ports Abdomen 1: Right;Upper 2: Right;Mid 3: Right;Lower   12/14/13    -     1879          Intake/Output Last 24 hours No intake or output data in the 24 hours ending 02/05/19 1839  Labs/Imaging Results for orders placed or performed during the hospital encounter of 02/05/19 (from the past 48 hour(s))  CBC with Differential     Status: Abnormal   Collection Time: 02/05/19  5:50 PM  Result Value Ref Range   WBC 7.6 4.0 - 10.5 K/uL   RBC 3.84 (L) 3.87 - 5.11 MIL/uL   Hemoglobin 12.1 12.0 - 15.0 g/dL   HCT 37.4 36.0 - 46.0 %   MCV 97.4 80.0 - 100.0 fL   MCH 31.5 26.0 - 34.0 pg   MCHC 32.4 30.0 - 36.0 g/dL    RDW 14.2 11.5 - 15.5 %   Platelets 206 150 - 400 K/uL   nRBC 0.0 0.0 - 0.2 %   Neutrophils Relative % 82 %   Neutro Abs 6.2 1.7 - 7.7 K/uL   Lymphocytes Relative 9 %   Lymphs Abs 0.7 0.7 - 4.0 K/uL   Monocytes Relative 6 %   Monocytes Absolute 0.5 0.1 -  1.0 K/uL   Eosinophils Relative 2 %   Eosinophils Absolute 0.1 0.0 - 0.5 K/uL   Basophils Relative 0 %   Basophils Absolute 0.0 0.0 - 0.1 K/uL   Immature Granulocytes 1 %   Abs Immature Granulocytes 0.04 0.00 - 0.07 K/uL    Comment: Performed at [facility], [place] [address] [place], [place] [address]   Dg Hip Malvin Johns Or Wo Pelvis 2-3 Views Left  Result Date: 02/05/2019 CLINICAL DATA:  Left hip pain due to a fall today. EXAM: DG HIP (WITH OR WITHOUT PELVIS) 2-3V LEFT COMPARISON:  None. FINDINGS: The patient has an acute subcapital fracture of the left hip. No other acute bony or joint abnormality. Contrast material in the colon is noted. Diverticulosis scratch the diverticular disease is seen. IMPRESSION: Acute subcapital fracture left hip. Electronically Signed   By: Inge Rise M.D.   On: 02/05/2019 17:56    Pending Labs Unresulted Labs (From admission, onward)    Start     Ordered   02/05/19 1750  Comprehensive metabolic panel  ONCE - STAT,   STAT     02/05/19 1750   02/05/19 1750  Urinalysis, Routine w reflex microscopic  Once,   R     02/05/19 1750          Vitals/Pain Today's Vitals   02/05/19 1555 02/05/19 1600 02/05/19 1733 02/05/19 1734  BP:    (!) 157/74  Pulse:    62  Resp:    14  Temp:      TempSrc:      SpO2: 100%   100%  Weight:  54.9 kg    Height:  5\' 5"  (1.651 m)    PainSc:  1  3      Isolation Precautions No active isolations  Medications Medications  morphine 4 MG/ML injection 4 mg (has no administration in time range)  fentaNYL (SUBLIMAZE) injection 25 mcg (25 mcg Intravenous Given 02/05/19 1737)    Mobility walks Low fall risk   Focused Assessments   R Recommendations: See  Admitting Provider Note  Report given to:   Additional Notes:

## 2019-02-05 NOTE — ED Notes (Signed)
ED TO INPATIENT HANDOFF REPORT  ED Nurse Name and Phone #: Petra Kuba [phone]  S Name/Age/Gender Kimberly [name] 63 y.o. female Room/Bed: 041C/041C  Code Status   Code Status: Prior  Home/SNF/Other Home Patient oriented to: self, place, time and situation Is this baseline? Yes   Triage Complete: Triage complete  Chief Complaint pain in left thigh groin hip  Triage Note Patient arrived [facility] with complaints of pain to left hip, upper leg (femue area) post fall from bed. Active ROM ,a ble to move all extremeties. Denise hitting head or LOC. Denies chest pain, shortness of breath, or pain in other areas. Abrasion to left clavicle area post.    Allergies Allergies  Allergen Reactions  . Mircera [Methoxy Polyethylene Glycol-Epoetin Beta] Other (See Comments)    Severe joint and muscle pain  . Dapsone Rash and Other (See Comments)    Pain in feet  . Pregabalin Rash and Other (See Comments)    fever  . Sulfonamide Derivatives Rash  . Tramadol Rash and Other (See Comments)    Rash on cheek    Level of Care/Admitting Diagnosis ED Disposition    ED Disposition Condition [facility] Area: [place] [[id number]]  Level of Care: Med-Surg [16]  Covid Evaluation: Screening Protocol (No Symptoms)  Diagnosis: Closed left hip fracture, initial encounter [facility]) [[id number]]  Admitting Physician: Lenore Cordia [[id number]]  Attending Physician: Lenore Cordia [[id number]]  Estimated length of stay: past midnight tomorrow  Certification:: I certify this patient will need inpatient services for at least 2 midnights  PT Class (Do Not Modify): Inpatient [101]  PT Acc Code (Do Not Modify): Private [1]       B Medical/Surgery History Past Medical History:  Diagnosis Date  . Anemia   . Asthma   . Complication of anesthesia    Slow to awaken and AMS- took 1 month to memory and recall to return  . Diabetes mellitus    medication induced with  steroids ,  05/19/18 - last A1C was 4.something  . Diverticulosis   . Dyspnea    with exertion. when walking up stairs or walking a distance  . First degree AV block   . GERD (gastroesophageal reflux disease)   . Heart murmur    child  . History of blood transfusion   . History of chronic cough   . History of hiatal hernia   . History of hoarseness   . Hypertension    history; resolved on dialysis  . Hypothyroidism   . IgA nephropathy    ESRD - Hemo TTH Sat  . Immunosuppression ([place])   . Kidney transplanted 01/2010   [facility], has had some rejection  . Patient on peritoneal dialysis ([place])   . Pneumonia   . Premature atrial beat   . Premature ventricular beat   . Renal transplant failure and rejection 2012  . Shingles 03/2011  . Stroke [facility]) October 2010, March 2013   cerebral aneurysm, 07/2017   Past Surgical History:  Procedure Laterality Date  . AV FISTULA PLACEMENT Left 05/20/2018   Procedure: INSERTION OF ARTERIOVENOUS (AV) GORE-TEX GRAFT LEFT  ARM;  Surgeon: [name], MD;  Location: [place];  Service: Vascular;  Laterality: Left;  . CAPD INSERTION  08/25/2008   open, Dr [name]  . CAPD INSERTION N/A 12/14/2013   Procedure: LAPAROSCOPIC INSERTION CONTINUOUS AMBULATORY PERITONEAL DIALYSIS  (CAPD) CATHETER;  Surgeon: [name], MD;  Location: [place];  Service:  General;  Laterality: N/A;  . CAPD REMOVAL  03/22/2010   Dr [name]  . COLONOSCOPY    . ESOPHAGOGASTRODUODENOSCOPY    . EYE SURGERY Bilateral    radial keratotomy  . Hemodialysis catheter Right 02/13/2018  . INSERTION OF MESH N/A 12/14/2013   Procedure: INSERTION OF MESH;  Surgeon: [name], MD;  Location: [place];  Service: General;  Laterality: N/A;  . KIDNEY TRANSPLANT Right 01/2010   [place]  . LAPAROSCOPIC LYSIS OF ADHESIONS N/A 12/14/2013   Procedure: LAPAROSCOPIC LYSIS OF ADHESIONS;  Surgeon: [name], MD;  Location: [place];  Service: General;  Laterality: N/A;  . UMBILICAL HERNIA REPAIR   10/25/2009   open with mesh,  Dr [name]  . URETER REVISION  04/2011   [facility]  . VENTRAL HERNIA REPAIR N/A 12/14/2013   Procedure: LAPAROSCOPIC VENTRAL HERNIA;  Surgeon: [name], MD;  Location: [place];  Service: General;  Laterality: N/A;  . VIDEO ASSISTED THORACOSCOPY (VATS)/WEDGE RESECTION Left 09/01/2014   Procedure: VIDEO ASSISTED THORACOSCOPY (VATS)/WEDGE RESECTION;  Surgeon: [name], MD;  Location: [place];  Service: Thoracic;  Laterality: Left;  Marland Kitchen VIDEO BRONCHOSCOPY Bilateral 07/05/2014   Procedure: VIDEO BRONCHOSCOPY WITH FLUORO;  Surgeon: [name], MD;  [facility];  Service: Cardiopulmonary;  Laterality: Bilateral;     A IV Location/Drains/Wounds Patient Lines/Drains/Airways Status   Active Line/Drains/Airways    Name:   Placement date:   Placement time:   Site:   Days:   Peripheral IV 02/05/19 Right Antecubital   02/05/19    -    Antecubital   less than 1   Fistula / Graft Left Upper arm Arteriovenous vein graft   05/20/18    1140    Upper arm   261   Incision (Closed) 12/14/13 Abdomen Other (Comment)   12/14/13    1402     1879   Incision (Closed) 09/01/14 Chest Left   09/01/14    1351     1618   Incision (Closed) 05/20/18 Arm Left   05/20/18    1145     261   Incision (Closed) 05/20/18 Axilla Left   05/20/18    1145     261   Incision - 3 Ports Abdomen 1: Right;Upper 2: Right;Mid 3: Right;Lower   12/14/13    -     1879          Intake/Output Last 24 hours No intake or output data in the 24 hours ending 02/05/19 1839  Labs/Imaging Results for orders placed or performed during the hospital encounter of 02/05/19 (from the past 48 hour(s))  CBC with Differential     Status: Abnormal   Collection Time: 02/05/19  5:50 PM  Result Value Ref Range   WBC 7.6 4.0 - 10.5 K/uL   RBC 3.84 (L) 3.87 - 5.11 MIL/uL   Hemoglobin 12.1 12.0 - 15.0 g/dL   HCT 37.4 36.0 - 46.0 %   MCV 97.4 80.0 - 100.0 fL   MCH 31.5 26.0 - 34.0 pg   MCHC 32.4 30.0 -  36.0 g/dL   RDW 14.2 11.5 - 15.5 %   Platelets 206 150 - 400 K/uL   nRBC 0.0 0.0 - 0.2 %   Neutrophils Relative % 82 %   Neutro Abs 6.2 1.7 - 7.7 K/uL   Lymphocytes Relative 9 %   Lymphs Abs 0.7 0.7 - 4.0 K/uL   Monocytes Relative 6 %   Monocytes Absolute 0.5 0.1 -  1.0 K/uL   Eosinophils Relative 2 %   Eosinophils Absolute 0.1 0.0 - 0.5 K/uL   Basophils Relative 0 %   Basophils Absolute 0.0 0.0 - 0.1 K/uL   Immature Granulocytes 1 %   Abs Immature Granulocytes 0.04 0.00 - 0.07 K/uL    Comment: Performed at [facility], [place] [address], [place], [place] [address]   Dg Hip Malvin Johns Or Wo Pelvis 2-3 Views Left  Result Date: 02/05/2019 CLINICAL DATA:  Left hip pain due to a fall today. EXAM: DG HIP (WITH OR WITHOUT PELVIS) 2-3V LEFT COMPARISON:  None. FINDINGS: The patient has an acute subcapital fracture of the left hip. No other acute bony or joint abnormality. Contrast material in the colon is noted. Diverticulosis scratch the diverticular disease is seen. IMPRESSION: Acute subcapital fracture left hip. Electronically Signed   By: Inge Rise M.D.   On: 02/05/2019 17:56    Pending Labs Unresulted Labs (From admission, onward)    Start     Ordered   02/05/19 1840  SARS Coronavirus 2 (CEPHEID - Performed in [place] hospital lab), [place]  (Asymptomatic Patients Labs)  Once,   R    Question:  Rule Out  Answer:  Yes   02/05/19 1839   02/05/19 1750  Comprehensive metabolic panel  ONCE - STAT,   STAT     02/05/19 1750   02/05/19 1750  Urinalysis, Routine w reflex microscopic  Once,   R     02/05/19 1750          Vitals/Pain Today's Vitals   02/05/19 1555 02/05/19 1600 02/05/19 1733 02/05/19 1734  BP:    (!) 157/74  Pulse:    62  Resp:    14  Temp:      TempSrc:      SpO2: 100%   100%  Weight:  54.9 kg    Height:  5\' 5"  (1.651 m)    PainSc:  1  3      Isolation Precautions No active isolations  Medications Medications  morphine 4 MG/ML injection 4 mg  (has no administration in time range)  fentaNYL (SUBLIMAZE) injection 25 mcg (25 mcg Intravenous Given 02/05/19 1737)    Mobility walks Low fall risk   Focused Assessments    R Recommendations: See Admitting Provider Note  Report given to:   Additional Notes:

## 2019-02-05 NOTE — Consult Note (Signed)
Reason for Consult: To manage dialysis and dialysis related needs Referring Physician: [name] is an 63 y.o. female with HTN, history of CVA, IgA nephropathy leading to ESRD- s/p failed transplant- previous PD, now on IHD at the [facility].  She has had no issues with HD- last tx 5/14- left near EDW.  She fell out of bed today- found to have a subcapitol fracture of the left hip- being admitted and for possible operative intervention tomorrow.  Other that hip pain, she denies complaint  Dialyzes at [facility]- TTS 4 hours  EDW 56. HD Bath 2/2/, Dialyzer 180, Heparin yes- 6000 per tx. Access left AVG.  . Gets epo 5000  Tiw,calcitriol 0.25 tiw.  sensipar 30 daily and fosrenol for binding.  Last labs as OP hgb 10.9, phos 4.0 and pth 526  Past Medical History:  Diagnosis Date  . Anemia   . Asthma   . Complication of anesthesia    Slow to awaken and AMS- took 1 month to memory and recall to return  . Diabetes mellitus    medication induced with  steroids , 05/19/18 - last A1C was 4.something  . Diverticulosis   . Dyspnea    with exertion. when walking up stairs or walking a distance  . First degree AV block   . GERD (gastroesophageal reflux disease)   . Heart murmur    child  . History of blood transfusion   . History of chronic cough   . History of hiatal hernia   . History of hoarseness   . Hypertension    history; resolved on dialysis  . Hypothyroidism   . IgA nephropathy    ESRD - Hemo TTH Sat  . Immunosuppression ([place])   . Kidney transplanted 01/2010   [facility], has had some rejection  . Patient on peritoneal dialysis ([place])   . Pneumonia   . Premature atrial beat   . Premature ventricular beat   . Renal transplant failure and rejection 2012  . Shingles 03/2011  . Stroke [facility]) October 2010, March 2013   cerebral aneurysm, 07/2017    Past Surgical History:  Procedure Laterality Date  . AV FISTULA PLACEMENT Left 05/20/2018   Procedure: INSERTION OF  ARTERIOVENOUS (AV) GORE-TEX GRAFT LEFT  ARM;  Surgeon: [name], MD;  Location: [place];  Service: Vascular;  Laterality: Left;  . CAPD INSERTION  08/25/2008   open, Dr [name]  . CAPD INSERTION N/A 12/14/2013   Procedure: LAPAROSCOPIC INSERTION CONTINUOUS AMBULATORY PERITONEAL DIALYSIS  (CAPD) CATHETER;  Surgeon: [name], MD;  Location: [place];  Service: General;  Laterality: N/A;  . CAPD REMOVAL  03/22/2010   Dr [name]  . COLONOSCOPY    . ESOPHAGOGASTRODUODENOSCOPY    . EYE SURGERY Bilateral    radial keratotomy  . Hemodialysis catheter Right 02/13/2018  . INSERTION OF MESH N/A 12/14/2013   Procedure: INSERTION OF MESH;  Surgeon: [name], MD;  Location: [place];  Service: General;  Laterality: N/A;  . KIDNEY TRANSPLANT Right 01/2010   [place]  . LAPAROSCOPIC LYSIS OF ADHESIONS N/A 12/14/2013   Procedure: LAPAROSCOPIC LYSIS OF ADHESIONS;  Surgeon: [name], MD;  Location: [place];  Service: General;  Laterality: N/A;  . UMBILICAL HERNIA REPAIR  10/25/2009   open with mesh,  Dr [name]  . URETER REVISION  04/2011   [facility]  . VENTRAL HERNIA REPAIR N/A 12/14/2013   Procedure: LAPAROSCOPIC VENTRAL HERNIA;  Surgeon: [name],  MD;  Location: [place];  Service: General;  Laterality: N/A;  . VIDEO ASSISTED THORACOSCOPY (VATS)/WEDGE RESECTION Left 09/01/2014   Procedure: VIDEO ASSISTED THORACOSCOPY (VATS)/WEDGE RESECTION;  Surgeon: [name], MD;  Location: [place];  Service: Thoracic;  Laterality: Left;  Marland Kitchen VIDEO BRONCHOSCOPY Bilateral 07/05/2014   Procedure: VIDEO BRONCHOSCOPY WITH FLUORO;  Surgeon: [name], MD;  Location: [facility];  Service: Cardiopulmonary;  Laterality: Bilateral;    Family History  Problem Relation Age of Onset  . Coronary artery disease Mother   . Emphysema Mother        smoked  . Ovarian cancer Mother   . Heart disease Mother   . Stroke Maternal Uncle   . Diabetes Maternal Uncle   . Kidney disease Maternal Uncle    . Liver disease Father     Social History:  reports that she has never smoked. She has never used smokeless tobacco. She reports current alcohol use. She reports that she does not use drugs.  Allergies:  Allergies  Allergen Reactions  . Mircera [Methoxy Polyethylene Glycol-Epoetin Beta] Other (See Comments)    Severe joint and muscle pain  . Dapsone Rash and Other (See Comments)    Pain in feet  . Pregabalin Rash and Other (See Comments)    fever  . Sulfonamide Derivatives Rash  . Tramadol Rash and Other (See Comments)    Rash on cheek    Medications: I have reviewed the patient's current medications.    Results for orders placed or performed during the hospital encounter of 02/05/19 (from the past 48 hour(s))  CBC with Differential     Status: Abnormal   Collection Time: 02/05/19  5:50 PM  Result Value Ref Range   WBC 7.6 4.0 - 10.5 K/uL   RBC 3.84 (L) 3.87 - 5.11 MIL/uL   Hemoglobin 12.1 12.0 - 15.0 g/dL   HCT 37.4 36.0 - 46.0 %   MCV 97.4 80.0 - 100.0 fL   MCH 31.5 26.0 - 34.0 pg   MCHC 32.4 30.0 - 36.0 g/dL   RDW 14.2 11.5 - 15.5 %   Platelets 206 150 - 400 K/uL   nRBC 0.0 0.0 - 0.2 %   Neutrophils Relative % 82 %   Neutro Abs 6.2 1.7 - 7.7 K/uL   Lymphocytes Relative 9 %   Lymphs Abs 0.7 0.7 - 4.0 K/uL   Monocytes Relative 6 %   Monocytes Absolute 0.5 0.1 - 1.0 K/uL   Eosinophils Relative 2 %   Eosinophils Absolute 0.1 0.0 - 0.5 K/uL   Basophils Relative 0 %   Basophils Absolute 0.0 0.0 - 0.1 K/uL   Immature Granulocytes 1 %   Abs Immature Granulocytes 0.04 0.00 - 0.07 K/uL    Comment: Performed at [facility], [address], [place], [place] [address]  Comprehensive metabolic panel     Status: Abnormal   Collection Time: 02/05/19  5:50 PM  Result Value Ref Range   Sodium 138 135 - 145 mmol/L   Potassium 4.3 3.5 - 5.1 mmol/L   Chloride 95 (L) 98 - 111 mmol/L   CO2 29 22 - 32 mmol/L   Glucose, Bld 108 (H) 70 - 99 mg/dL   BUN 47 (H) 8 - 23 mg/dL    Creatinine, Ser 6.72 (H) 0.44 - 1.00 mg/dL   Calcium 9.5 8.9 - 10.3 mg/dL   Total Protein 6.6 6.5 - 8.1 g/dL   Albumin 3.3 (L) 3.5 - 5.0 g/dL  AST 14 (L) 15 - 41 U/L   ALT 12 0 - 44 U/L   Alkaline Phosphatase 196 (H) 38 - 126 U/L   Total Bilirubin 0.7 0.3 - 1.2 mg/dL   GFR calc non Af Amer 6 (L) >60 mL/min   GFR calc Af Amer 7 (L) >60 mL/min   Anion gap 14 5 - 15    Comment: Performed at [facility] [address], [place], [place] [address]  SARS Coronavirus 2 (CEPHEID - Performed in [place] hospital lab), Hosp Order     Status: None   Collection Time: 02/05/19  7:12 PM  Result Value Ref Range   SARS Coronavirus 2 NEGATIVE NEGATIVE    Comment: (NOTE) If result is NEGATIVE SARS-CoV-2 target nucleic acids are NOT DETECTED. The SARS-CoV-2 RNA is generally detectable in upper and lower  respiratory specimens during the acute phase of infection. The lowest  concentration of SARS-CoV-2 viral copies this assay can detect is 250  copies / mL. A negative result does not preclude SARS-CoV-2 infection  and should not be used as the sole basis for treatment or other  patient management decisions.  A negative result may occur with  improper specimen collection / handling, submission of specimen other  than nasopharyngeal swab, presence of viral mutation(s) within the  areas targeted by this assay, and inadequate number of viral copies  (<250 copies / mL). A negative result must be combined with clinical  observations, patient history, and epidemiological information. If result is POSITIVE SARS-CoV-2 target nucleic acids are DETECTED. The SARS-CoV-2 RNA is generally detectable in upper and lower  respiratory specimens dur ing the acute phase of infection.  Positive  results are indicative of active infection with SARS-CoV-2.  Clinical  correlation with patient history and other diagnostic information is  necessary to determine patient infection status.  Positive results do   not rule out bacterial infection or co-infection with other viruses. If result is PRESUMPTIVE POSTIVE SARS-CoV-2 nucleic acids MAY BE PRESENT.   A presumptive positive result was obtained on the submitted specimen  and confirmed on repeat testing.  While 2019 novel coronavirus  (SARS-CoV-2) nucleic acids may be present in the submitted sample  additional confirmatory testing may be necessary for epidemiological  and / or clinical management purposes  to differentiate between  SARS-CoV-2 and other Sarbecovirus currently known to infect humans.  If clinically indicated additional testing with an alternate test  methodology [phone]) is advised. The SARS-CoV-2 RNA is generally  detectable in upper and lower respiratory sp ecimens during the acute  phase of infection. The expected result is Negative. Fact Sheet for Patients:  StrictlyIdeas.no Fact Sheet for Healthcare Providers: BankingDealers.co.za This test is not yet approved or cleared by the [place] FDA and has been authorized for detection and/or diagnosis of SARS-CoV-2 by FDA under an Emergency Use Authorization (EUA).  This EUA will remain in effect (meaning this test can be used) for the duration of the COVID-19 declaration under Section 564(b)(1) of the Act, 21 U.S.C. section 360bbb-3(b)(1), unless the authorization is terminated or revoked sooner. Performed at [facility], [place] [address] [place], [place] [address]   Type and screen [place]     Status: None   Collection Time: 02/05/19  8:00 PM  Result Value Ref Range   ABO/RH(D) B POS    Antibody Screen NEG    Sample Expiration      02/08/2019,2359 Performed at [facility],  [address], [place], [place] [address]     Ct Hip Left Wo Contrast  Result Date: 02/05/2019 CLINICAL DATA:  Left hip pain after a fall out of bed. Initial encounter. EXAM: CT OF THE LEFT HIP WITHOUT CONTRAST  TECHNIQUE: Multidetector CT imaging of the left hip was performed according to the standard protocol. Multiplanar CT image reconstructions were also generated. COMPARISON:  Plain films left hip earlier today. FINDINGS: Bones/Joint/Cartilage As seen on the comparison examination, the patient has an acute subcapital fracture of the left hip. The superior aspect of the fracture is impacted 1.3 cm. There is minimal anterior displacement femoral neck. No other is seen. No lytic or sclerotic lesion. Ligaments Suboptimally assessed by CT. Muscles and Tendons Intact. Soft tissues Contrast in the colon and diverticulosis noted. IMPRESSION: Acute subcapital fracture left hip. Electronically Signed   By: Inge Rise M.D.   On: 02/05/2019 19:59   Dg Hip Unilat W Or Wo Pelvis 2-3 Views Left  Result Date: 02/05/2019 CLINICAL DATA:  Left hip pain due to a fall today. EXAM: DG HIP (WITH OR WITHOUT PELVIS) 2-3V LEFT COMPARISON:  None. FINDINGS: The patient has an acute subcapital fracture of the left hip. No other acute bony or joint abnormality. Contrast material in the colon is noted. Diverticulosis scratch the diverticular disease is seen. IMPRESSION: Acute subcapital fracture left hip. Electronically Signed   By: Inge Rise M.D.   On: 02/05/2019 17:56    ROS: negative except for hip pain  Blood pressure (!) 155/75, pulse (!) 54, temperature 98.3 F (36.8 C), temperature source Oral, resp. rate 14, height 5\' 5"  (1.651 m), weight 55.4 kg, SpO2 100 %. General appearance: alert and cooperative Resp: clear to auscultation bilaterally Cardio: irregularly irregular rhythm GI: soft, non-tender; bowel sounds normal; no masses,  no organomegaly Extremities: extremities normal, atraumatic, no cyanosis or edema and pain at left hip Neurologic: Grossly normal left AVG- good thrill and bruit   Assessment/Plan: 63 year old WF- ESRD s/p failed transplant- presents after mechanical fall and hip fracture  1 Hip  fracture-  For possible operative repair tomorrow- pain control 2 ESRD: normally TTS via AVG- will plan for routine HD tomorrow- coordinate with surgery  3 Hypertension: pt tends to get orthostatic as OP- on no BP meds- EDW seems appropriate as OP as well- UF to goal  4. Anemia of ESRD: actually gets epogen- I think because of her concerns with other ESA's-  hgb above goal right now, will hold ESA and follow 5. Metabolic Bone Disease: cont her home calcitriol, fosrenol and sensipar   Louis Meckel 02/05/2019, 9:34 PM

## 2019-02-05 NOTE — ED Notes (Signed)
Patient back from CT.

## 2019-02-05 NOTE — ED Notes (Signed)
0.5 mg dilaudid wasted in sharps with Ryland RN.

## 2019-02-05 NOTE — ED Triage Notes (Signed)
Patient arrived [facility] with complaints of pain to left hip, upper leg (femue area) post fall from bed. Active ROM ,a ble to move all extremeties. Denise hitting head or LOC. Denies chest pain, shortness of breath, or pain in other areas. Abrasion to left clavicle area post.

## 2019-02-05 NOTE — ED Notes (Signed)
Patient transported to X-ray 

## 2019-02-05 NOTE — ED Notes (Signed)
Patient requesting pain medication before CT so that pain will not be as severe with positioning.

## 2019-02-05 NOTE — H&P (Signed)
History and Physical    [place] KNL:[id number] DOB: August 30, 1956 DOA: 02/05/2019  PCP: [name], MD  Patient coming from: Home via EMS  I have personally briefly reviewed patient's old medical records in [place]  Chief Complaint: Left hip pain after a fall  HPI: Kimberly [name] is a 63 y.o. female with medical history significant for IgA nephropathy with ESRD on TTS HD, history of failed renal transplant, hypertension, history of CVA, hypothyroidism, and asthma who presents to the ED with left hip pain after a fall.  Patient states she was in her usual state of health when she was getting out of bed she slipped and fell on her left side.  She was able to get herself back up into bed however was having severe left hip pain and was unable to move her left leg.  Her husband called EMS services and she was given fentanyl for pain and brought to the ED.  Patient denies any associated injury to her head, lightheadedness, dizziness, loss of consciousness, chest pain, palpitations, dyspnea, abdominal pain, nausea, vomiting, loss of bowel/bladder control, or seizure-like activity.  She has end-stage renal disease and no longer makes urine.  She is on Tuesday/Thursday/Saturday HD last session this past Thursday, 02/04/2019.  She has a history of stroke and is on aspirin 325 mg for secondary prophylaxis, last taken about 2 days ago per patient.  ED Course:  Initial vitals showed BP 154/72, pulse 51, RR 16, temp 98.6 Fahrenheit, SPO2 100% room air.  Labs are notable for WBC 7.6, hemoglobin 12.1, platelets 206,000, BUN 47, creatinine 6.72 (consistent with ESRD).  Left hip x-ray showed an acute subcapital fracture of the left hip.  EDP discussed the case with orthopedics, Dr. Doreatha Martin, who recommended medical admission and to keep patient n.p.o. at midnight.  CT of the left hip without contrast was ordered.  Patient was given fentanyl for pain and the hospital service was consulted to admit  for further management.  Review of Systems: 10 point review of systems is negative except for pertinent positives and negatives as documented in history of present illness above.   Past Medical History:  Diagnosis Date   Anemia    Asthma    Complication of anesthesia    Slow to awaken and AMS- took 1 month to memory and recall to return   Diabetes mellitus    medication induced with  steroids , 05/19/18 - last A1C was 4.something   Diverticulosis    Dyspnea    with exertion. when walking up stairs or walking a distance   First degree AV block    GERD (gastroesophageal reflux disease)    Heart murmur    child   History of blood transfusion    History of chronic cough    History of hiatal hernia    History of hoarseness    Hypertension    history; resolved on dialysis   Hypothyroidism    IgA nephropathy    ESRD - Hemo TTH Sat   Immunosuppression ([place])    Kidney transplanted 01/2010   [facility], has had some rejection   Patient on peritoneal dialysis [facility]    Pneumonia    Premature atrial beat    Premature ventricular beat    Renal transplant failure and rejection 2012   Shingles 03/2011   Stroke [facility]) October 2010, March 2013   cerebral aneurysm, 07/2017    Past Surgical History:  Procedure Laterality Date   AV FISTULA PLACEMENT Left  05/20/2018   Procedure: INSERTION OF ARTERIOVENOUS (AV) GORE-TEX GRAFT LEFT  ARM;  Surgeon: [name], MD;  Location: [place];  Service: Vascular;  Laterality: Left;   CAPD INSERTION  08/25/2008   open, Dr [name]   CAPD INSERTION N/A 12/14/2013   Procedure: LAPAROSCOPIC INSERTION CONTINUOUS AMBULATORY PERITONEAL DIALYSIS  (CAPD) CATHETER;  Surgeon: [name], MD;  Location: [place];  Service: General;  Laterality: N/A;   CAPD REMOVAL  03/22/2010   Dr [name]   COLONOSCOPY     ESOPHAGOGASTRODUODENOSCOPY     EYE SURGERY Bilateral    radial keratotomy   Hemodialysis catheter Right 02/13/2018     INSERTION OF MESH N/A 12/14/2013   Procedure: INSERTION OF MESH;  Surgeon: [name], MD;  Location: [place];  Service: General;  Laterality: N/A;   KIDNEY TRANSPLANT Right 01/2010   [facility]   LAPAROSCOPIC LYSIS OF ADHESIONS N/A 12/14/2013   Procedure: LAPAROSCOPIC LYSIS OF ADHESIONS;  Surgeon: [name], MD;  Location: [place];  Service: General;  Laterality: N/A;   UMBILICAL HERNIA REPAIR  10/25/2009   open with mesh,  Dr [name]   URETER REVISION  04/2011   [facility]   VENTRAL HERNIA REPAIR N/A 12/14/2013   Procedure: LAPAROSCOPIC VENTRAL HERNIA;  Surgeon: [name], MD;  Location: [place];  Service: General;  Laterality: N/A;   [place] (VATS)/WEDGE RESECTION Left 09/01/2014   Procedure: VIDEO ASSISTED THORACOSCOPY (VATS)/WEDGE RESECTION;  Surgeon: [name], MD;  Location: [place];  Service: Thoracic;  Laterality: Left;   VIDEO BRONCHOSCOPY Bilateral 07/05/2014   Procedure: VIDEO BRONCHOSCOPY WITH FLUORO;  Surgeon: [name], MD;  Location: [facility];  Service: Cardiopulmonary;  Laterality: Bilateral;     reports that she has never smoked. She has never used smokeless tobacco. She reports current alcohol use. She reports that she does not use drugs.  Allergies  Allergen Reactions   Mircera [Methoxy Polyethylene Glycol-Epoetin Beta] Other (See Comments)    Severe joint and muscle pain   Dapsone Rash and Other (See Comments)    Pain in feet   Pregabalin Rash and Other (See Comments)    fever   Sulfonamide Derivatives Rash   Tramadol Rash and Other (See Comments)    Rash on cheek    Family History  Problem Relation Age of Onset   Coronary artery disease Mother    Emphysema Mother        smoked   Ovarian cancer Mother    Heart disease Mother    Stroke Maternal Uncle    Diabetes Maternal Uncle    Kidney disease Maternal Uncle    Liver disease Father      Prior to Admission medications   Medication Sig Start  Date End Date Taking? Authorizing Provider  amLODipine (NORVASC) 5 MG tablet Take 5 mg by mouth at bedtime.    [provider]  aspirin EC 325 MG tablet Take 1 tablet (325 mg total) by mouth daily. 09/18/17   Melvenia Beam, MD  BREO ELLIPTA 200-25 MCG/INH AEPB Inhale 1 puff into the lungs every evening. 06/04/17   [provider]  epoetin alfa (EPOGEN,PROCRIT) 79024 UNIT/ML injection Inject 10,000 Units into the skin See admin instructions. Inject 10000 units SQ when going to dialysis, only given depending on pt Hemoglobin.    [provider]  levothyroxine (SYNTHROID, LEVOTHROID) 50 MCG tablet Take 50-75 mcg by mouth See admin instructions. Take 75 mcg by mouth daily on Tuesday  and Thursday. Take 50 mcg by mouth daily on all other days.    [provider]  loperamide (IMODIUM) 2 MG capsule Take by mouth as needed for diarrhea or loose stools.    [provider]  oxyCODONE-acetaminophen (PERCOCET) 5-325 MG tablet Take 1 tablet by mouth every 6 (six) hours as needed for severe pain. 05/20/18   Rhyne, Samantha J, PA-C  SPIRIVA RESPIMAT 1.25 MCG/ACT AERS Inhale 2 puffs into the lungs daily. 05/16/17   [provider]    Physical Exam: Vitals:   02/05/19 1552 02/05/19 1555 02/05/19 1600 02/05/19 1734  BP: (!) 154/72   (!) 157/74  Pulse: (!) 51   62  Resp: 16   14  Temp: 98.6 F (37 C)     TempSrc: Oral     SpO2: 100% 100%  100%  Weight:   54.9 kg   Height:   5\' 5"  (1.651 m)     Constitutional: Resting supine in bed, calm, in pain Eyes: PERRL, lids and conjunctivae normal ENMT: Mucous membranes are moist. Posterior pharynx clear of any exudate or lesions. Neck: normal, supple, no masses. Respiratory: clear to auscultation anteriorly, no wheezing, no crackles. Normal respiratory effort. No accessory muscle use.  Cardiovascular: Regular rate and rhythm, systolic flow murmur. No extremity edema. 2+ pedal pulses.  LUE aVF with palpable  thrill. Abdomen: no tenderness, no masses palpated. No hepatosplenomegaly. Bowel sounds positive.  Musculoskeletal: Left foot everted, ROM diminished due to pain/left hip fracture.  With both toes of both feet and plantar and dorsiflexion intact bilaterally. Skin: no rashes, lesions, ulcers. No induration Neurologic: CN 2-12 grossly intact. Sensation intact, Strength 5/5 both upper extremities, limited lower extremities due to pain from left hip fracture.  Psychiatric: Normal judgment and insight. Alert and oriented x 3. Normal mood.    Labs on Admission: I have personally reviewed following labs and imaging studies  CBC: Recent Labs  Lab 02/05/19 1750  WBC 7.6  NEUTROABS 6.2  HGB 12.1  HCT 37.4  MCV 97.4  PLT 659   Basic Metabolic Panel: Recent Labs  Lab 02/05/19 1750  NA 138  K 4.3  CL 95*  CO2 29  GLUCOSE 108*  BUN 47*  CREATININE 6.72*  CALCIUM 9.5   GFR: Estimated Creatinine Clearance: 7.5 mL/min (A) (by C-G formula based on SCr of 6.72 mg/dL (H)). Liver Function Tests: Recent Labs  Lab 02/05/19 1750  AST 14*  ALT 12  ALKPHOS 196*  BILITOT 0.7  PROT 6.6  ALBUMIN 3.3*   No results for input(s): LIPASE, AMYLASE in the last 168 hours. No results for input(s): AMMONIA in the last 168 hours. Coagulation Profile: No results for input(s): INR, PROTIME in the last 168 hours. Cardiac Enzymes: No results for input(s): CKTOTAL, CKMB, CKMBINDEX, TROPONINI in the last 168 hours. BNP (last 3 results) No results for input(s): PROBNP in the last 8760 hours. HbA1C: No results for input(s): HGBA1C in the last 72 hours. CBG: No results for input(s): GLUCAP in the last 168 hours. Lipid Profile: No results for input(s): CHOL, HDL, LDLCALC, TRIG, CHOLHDL, LDLDIRECT in the last 72 hours. Thyroid Function Tests: No results for input(s): TSH, T4TOTAL, FREET4, T3FREE, THYROIDAB in the last 72 hours. Anemia Panel: No results for input(s): VITAMINB12, FOLATE, FERRITIN, TIBC,  IRON, RETICCTPCT in the last 72 hours. Urine analysis:    Component Value Date/Time   COLORURINE RED BIOCHEMICALS MAY BE AFFECTED BY COLOR (A) 01/03/2011 2311   APPEARANCEUR TURBID (A) 01/03/2011 2311  LABSPEC 1.013 01/03/2011 2311   PHURINE 6.0 01/03/2011 2311   GLUCOSEU 100 (A) 01/03/2011 2311   HGBUR LARGE (A) 01/03/2011 2311   BILIRUBINUR MODERATE (A) 01/03/2011 2311   KETONESUR 15 (A) 01/03/2011 2311   PROTEINUR >300 (A) 01/03/2011 2311   UROBILINOGEN 0.2 01/03/2011 2311   NITRITE POSITIVE (A) 01/03/2011 2311   LEUKOCYTESUR LARGE (A) 01/03/2011 2311    Radiological Exams on Admission: Dg Hip Unilat W Or Wo Pelvis 2-3 Views Left  Result Date: 02/05/2019 CLINICAL DATA:  Left hip pain due to a fall today. EXAM: DG HIP (WITH OR WITHOUT PELVIS) 2-3V LEFT COMPARISON:  None. FINDINGS: The patient has an acute subcapital fracture of the left hip. No other acute bony or joint abnormality. Contrast material in the colon is noted. Diverticulosis scratch the diverticular disease is seen. IMPRESSION: Acute subcapital fracture left hip. Electronically Signed   By: Inge Rise M.D.   On: 02/05/2019 17:56    EKG: Ordered and pending.  Assessment/Plan Principal Problem:   Closed left hip fracture, initial encounter ([place]) Active Problems:   Hypothyroidism, unspecified   HTN (hypertension)   Asthma   ESRD (end stage renal disease) ([place])   History of stroke  Kimberly [name] is a 63 y.o. female with medical history significant for IgA nephropathy with ESRD on TTS HD, history of failed renal transplant, hypertension, history of CVA, hypothyroidism, and asthma who was admitted with the acute subcapital fracture of the left hip.   Acute subcapital fracture left hip: Occurring after a mechanical fall.  Orthopedics consulted and plan for potential surgical intervention tomorrow.  Based on the revised cardiac risk index she has a 10.1% preoperative 30-day risk of death, MI, or cardiac arrest  based on her history of ESRD and CVA. -Orthopedics consulted, plan for potential surgery tomorrow -CT left hip ordered and pending -Allow renal diet now, n.p.o. at midnight -Continue pain control with oral Norco and IV Dilaudid as needed  ESRD on TTS HD: Primary nephrologist is Dr. Jimmy Footman.  No emergent indication for dialysis at this time. -Will need nephrology consultation for regular dialysis while in hospital  Hx of CVA: History of cryptogenic stroke, embolic in nature.  Follows with neurology as an outpatient, had plans for TEE/ILR overnight completed. She is on aspirin 325 mg alone as she has wanted to avoid coumadin per prior documentation. -Hold ASA 325 mg daily pending surgical intervention, resume as soon as able  HTN: Mildly hypertensive on admission.  Resume home amlodipine 5 mg daily.  Asthma: Currently stable without respiratory symptoms. -Continue Brio Ellipta, Spiriva, and as needed albuterol  Hypothyroidism: -Continue home Synthroid   DVT prophylaxis: SCDs Code Status: Full code, confirmed with patient Family Communication: None present on admission Disposition Plan: Pending orthopedic intervention and clearance, postop recovery, and PT/OT eval Consults called: Orthopedics Admission status: Inpatient, requires greater than 2 midnight length stay for orthopedic management of left hip fracture and postop recovery and therapy   Zada Finders MD [facility]  If 7PM-7AM, please contact night-coverage [email or web address]  02/05/2019, 7:25 PM

## 2019-02-05 NOTE — ED Provider Notes (Signed)
[place] EMERGENCY DEPARTMENT Provider Note   CSN: [id number] Arrival date & time:       History   Chief Complaint Chief Complaint  Patient presents with  . Fall    HPI Kimberly [name] is a 63 y.o. female.     63 y.o female with a  PMH of Iga Nephropathy currently on dialysis TTH, DM, HTN presents to the ED via EMS with a chief complaint of left hip pain s/p fall x 2 hours prior to arrival. Patient reports she was in bed when she fell on her left hip. Patient reports being unable to ambulate or move her left leg after the incident. She was given fentanyl by EMS with improvement in symptoms. She reports pain is worse with movement but better with rest. She denies any shortness of breath, weakness, or other symptoms.      Past Medical History:  Diagnosis Date  . Anemia   . Asthma   . Complication of anesthesia    Slow to awaken and AMS- took 1 month to memory and recall to return  . Diabetes mellitus    medication induced with  steroids , 05/19/18 - last A1C was 4.something  . Diverticulosis   . Dyspnea    with exertion. when walking up stairs or walking a distance  . First degree AV block   . GERD (gastroesophageal reflux disease)   . Heart murmur    child  . History of blood transfusion   . History of chronic cough   . History of hiatal hernia   . History of hoarseness   . Hypertension    history; resolved on dialysis  . Hypothyroidism   . IgA nephropathy    ESRD - Hemo TTH Sat  . Immunosuppression ([place])   . Kidney transplanted 01/2010   [facility], has had some rejection  . Patient on peritoneal dialysis ([place])   . Pneumonia   . Premature atrial beat   . Premature ventricular beat   . Renal transplant failure and rejection 2012  . Shingles 03/2011  . Stroke [facility]) October 2010, March 2013   cerebral aneurysm, 07/2017    Patient Active Problem List   Diagnosis Date Noted  . Closed left hip fracture, initial encounter ([place]) 02/05/2019  .  History of stroke 02/05/2019  . IgA nephropathy with nephrotic syndrome 10/30/2017  . Snoring 10/30/2017  . Excessive daytime sleepiness 10/30/2017  . Complaint related to dreams 10/30/2017  . Acute embolic stroke ([place]) 17/61/6073  . Pre-transplant evaluation for kidney transplant 05/08/2017  . Laryngopharyngeal reflux (LPR) 05/29/2016  . Cough, persistent 05/29/2016  . Chronic cough 09/15/2015  . Paroxysmal atrial fibrillation ([place]) 08/21/2015  . Acute pericarditis 08/05/2015  . Elevated troponin 08/05/2015  . Pain in the chest   . Atypical chest pain 08/04/2015  . LUL cavitary lesion post VATS 2015-cx neg 09/01/2014  . Pulmonary infiltrates 07/05/2014  . Gross hematuria 06/30/2014  . Kidney transplant failure 06/28/2014  . Renal failure 06/28/2014  . Peritoneal dialysis catheter in place March 2015 01/14/2014  . Ventral hernia s/p lap repair w mesh March 2015 01/14/2014  . Secondary hyperparathyroidism (of renal origin) 04/20/2013  . ESRD (end stage renal disease) ([place]) 10/28/2012  . Diarrhea, unspecified 08/10/2012  . Urinary tract infection 08/10/2012  . Reactive airway disease 03/13/2012  . Pulmonary nodule 03/13/2012  . Stroke/cerebrovascular accident ([place]) 03/13/2012  . Vitamin D deficiency, unspecified 03/13/2012  . PVC's (premature ventricular contractions) 01/17/2012  . Stroke [facility])  11/29/2011  . DM type 2 causing complication ([place]) 42/35/3614  . S/P kidney transplant   . IgA nephropathy   . GERD (gastroesophageal reflux disease) 12/07/2007  . Cough variant asthma vs uacs vs components of both  11/09/2007  . Hypothyroidism, unspecified 10/12/2007  . ANEMIA 10/12/2007  . HTN (hypertension) 10/12/2007  . CARDIAC ARRHYTHMIA 10/12/2007  . Asthma 10/12/2007  . CKD (chronic kidney disease) stage V requiring chronic dialysis ([place]) 10/12/2007    Past Surgical History:  Procedure Laterality Date  . AV FISTULA PLACEMENT Left 05/20/2018   Procedure: INSERTION OF  ARTERIOVENOUS (AV) GORE-TEX GRAFT LEFT  ARM;  Surgeon: [name], MD;  Location: [place];  Service: Vascular;  Laterality: Left;  . CAPD INSERTION  08/25/2008   open, Dr [name]  . CAPD INSERTION N/A 12/14/2013   Procedure: LAPAROSCOPIC INSERTION CONTINUOUS AMBULATORY PERITONEAL DIALYSIS  (CAPD) CATHETER;  Surgeon: [name], MD;  Location: [facility];  Service: General;  Laterality: N/A;  . CAPD REMOVAL  03/22/2010   Dr [name]  . COLONOSCOPY    . ESOPHAGOGASTRODUODENOSCOPY    . EYE SURGERY Bilateral    radial keratotomy  . Hemodialysis catheter Right 02/13/2018  . INSERTION OF MESH N/A 12/14/2013   Procedure: INSERTION OF MESH;  Surgeon: [name], MD;  Location: [place];  Service: General;  Laterality: N/A;  . KIDNEY TRANSPLANT Right 01/2010   [facility]  . LAPAROSCOPIC LYSIS OF ADHESIONS N/A 12/14/2013   Procedure: LAPAROSCOPIC LYSIS OF ADHESIONS;  Surgeon: [name], MD;  Location: [facility];  Service: General;  Laterality: N/A;  . UMBILICAL HERNIA REPAIR  10/25/2009   open with mesh,  Dr [name]  . URETER REVISION  04/2011   [facility]  . VENTRAL HERNIA REPAIR N/A 12/14/2013   Procedure: LAPAROSCOPIC VENTRAL HERNIA;  Surgeon: [name], MD;  Location: [place];  Service: General;  Laterality: N/A;  . VIDEO ASSISTED THORACOSCOPY (VATS)/WEDGE RESECTION Left 09/01/2014   Procedure: VIDEO ASSISTED THORACOSCOPY (VATS)/WEDGE RESECTION;  Surgeon: [name], MD;  Location: [facility];  Service: Thoracic;  Laterality: Left;  Marland Kitchen VIDEO BRONCHOSCOPY Bilateral 07/05/2014   Procedure: VIDEO BRONCHOSCOPY WITH FLUORO;  Surgeon: [name], MD;  Location: [facility];  Service: Cardiopulmonary;  Laterality: Bilateral;     OB History   No obstetric history on file.      Home Medications    Prior to Admission medications   Medication Sig Start Date End Date Taking? Authorizing Provider  amLODipine (NORVASC) 5 MG tablet Take 5 mg by mouth at bedtime.    [provider]  aspirin EC 325 MG tablet Take 1 tablet (325 mg total) by mouth daily. 09/18/17   Melvenia Beam, MD  BREO ELLIPTA 200-25 MCG/INH AEPB Inhale 1 puff into the lungs every evening. 06/04/17   [provider]  epoetin alfa (EPOGEN,PROCRIT) 43154 UNIT/ML injection Inject 10,000 Units into the skin See admin instructions. Inject 10000 units SQ when going to dialysis, only given depending on pt Hemoglobin.    [provider]  levothyroxine (SYNTHROID, LEVOTHROID) 50 MCG tablet Take 50-75 mcg by mouth See admin instructions. Take 75 mcg by mouth daily on Tuesday and Thursday. Take 50 mcg by mouth daily on all other days.    [provider]  loperamide (IMODIUM) 2 MG capsule Take by mouth as needed for diarrhea or loose stools.    [provider]  oxyCODONE-acetaminophen (PERCOCET) 5-325 MG tablet Take 1 tablet by mouth every 6 (  six) hours as needed for severe pain. 05/20/18   Rhyne, Samantha J, PA-C  SPIRIVA RESPIMAT 1.25 MCG/ACT AERS Inhale 2 puffs into the lungs daily. 05/16/17   [provider]    Family History Family History  Problem Relation Age of Onset  . Coronary artery disease Mother   . Emphysema Mother        smoked  . Ovarian cancer Mother   . Heart disease Mother   . Stroke Maternal Uncle   . Diabetes Maternal Uncle   . Kidney disease Maternal Uncle   . Liver disease Father     Social History Social History   Tobacco Use  . Smoking status: Never Smoker  . Smokeless tobacco: Never Used  Substance Use Topics  . Alcohol use: Yes    Comment: ocassional- not weekly  . Drug use: No     Allergies   Mircera [methoxy polyethylene glycol-epoetin beta]; Dapsone; Pregabalin; Sulfonamide derivatives; and Tramadol   Review of Systems Review of Systems  Constitutional: Negative for chills and fever.  HENT: Negative for ear pain and sore throat.   Eyes: Negative for pain and visual disturbance.  Respiratory: Negative for  cough and shortness of breath.   Cardiovascular: Negative for chest pain and palpitations.  Gastrointestinal: Negative for abdominal pain and vomiting.  Genitourinary: Negative for dysuria and hematuria.  Musculoskeletal: Positive for gait problem, joint swelling and myalgias. Negative for arthralgias.  Skin: Negative for color change and rash.  Neurological: Negative for seizures and syncope.  All other systems reviewed and are negative.    Physical Exam Updated Vital Signs BP (!) 157/74 (BP Location: Right Arm)   Pulse 62   Temp 98.6 F (37 C) (Oral)   Resp 14   Ht 5\' 5"  (1.651 m)   Wt 54.9 kg   SpO2 100%   BMI 20.14 kg/m   Physical Exam Vitals signs and nursing note reviewed.  Constitutional:      General: She is not in acute distress.    Appearance: She is well-developed.  HENT:     Head: Normocephalic and atraumatic.     Mouth/Throat:     Pharynx: No oropharyngeal exudate.  Eyes:     Pupils: Pupils are equal, round, and reactive to light.  Neck:     Musculoskeletal: Normal range of motion.  Cardiovascular:     Rate and Rhythm: Regular rhythm.     Heart sounds: Normal heart sounds.  Pulmonary:     Effort: Pulmonary effort is normal. No respiratory distress.     Breath sounds: Normal breath sounds.  Abdominal:     General: Bowel sounds are normal. There is no distension.     Palpations: Abdomen is soft.     Tenderness: There is no abdominal tenderness.  Musculoskeletal:        General: No deformity.     Left hip: She exhibits decreased range of motion, decreased strength, tenderness and bony tenderness. She exhibits no swelling, no crepitus, no deformity and no laceration.     Right lower leg: No edema.     Left lower leg: No edema.     Comments: Decrease ROM to left leg, unable to leg raise but can dorsiflex and plantarflex. Pulses present and symmetric.   Skin:    General: Skin is warm and dry.  Neurological:     Mental Status: She is alert and oriented to  person, place, and time.      ED Treatments / Results  Labs (all  labs ordered are listed, but only abnormal results are displayed) Labs Reviewed  CBC WITH DIFFERENTIAL/PLATELET - Abnormal; Notable for the following components:      Result Value   RBC 3.84 (*)    All other components within normal limits  COMPREHENSIVE METABOLIC PANEL - Abnormal; Notable for the following components:   Chloride 95 (*)    Glucose, Bld 108 (*)    BUN 47 (*)    Creatinine, Ser 6.72 (*)    Albumin 3.3 (*)    AST 14 (*)    Alkaline Phosphatase 196 (*)    GFR calc non Af Amer 6 (*)    GFR calc Af Amer 7 (*)    All other components within normal limits  SARS CORONAVIRUS 2 (HOSPITAL ORDER, [name])  URINALYSIS, ROUTINE W REFLEX MICROSCOPIC  TYPE AND SCREEN    EKG None  Radiology Dg Hip Unilat W Or Wo Pelvis 2-3 Views Left  Result Date: 02/05/2019 CLINICAL DATA:  Left hip pain due to a fall today. EXAM: DG HIP (WITH OR WITHOUT PELVIS) 2-3V LEFT COMPARISON:  None. FINDINGS: The patient has an acute subcapital fracture of the left hip. No other acute bony or joint abnormality. Contrast material in the colon is noted. Diverticulosis scratch the diverticular disease is seen. IMPRESSION: Acute subcapital fracture left hip. Electronically Signed   By: Inge Rise M.D.   On: 02/05/2019 17:56    Procedures Procedures (including critical care time)  Medications Ordered in ED Medications  morphine 4 MG/ML injection 4 mg (has no administration in time range)  fentaNYL (SUBLIMAZE) injection 25 mcg (25 mcg Intravenous Given 02/05/19 1737)     Initial Impression / Assessment and Plan / ED Course  I have reviewed the triage vital signs and the nursing notes.  Pertinent labs & imaging results that were available during my care of the patient were reviewed by me and considered in my medical decision making (see chart for details).   Patient presents to the ED status post  fall at home while in bed 2 hours prior to arrival.  Was given fentanyl by EMS with improvement in symptoms.  During evaluation patient is well-appearing, she reports pain only occurs when moved.  Hip does not look shortening or rotated.  She is able to wiggle her toes on the left foot, is neurovascularly intact.  Xray of the left hip showed: Acute subcapital fracture left hip.  Call will be placed for orthopedist further recommendations.  Patient's last meal was this morning breakfast where she had an apple.  Has not eaten since.  She denies any blood thinner use, does get heparin with her dialysis. 6:14 PM Spoke to Dr. Doreatha Martin who requested hospitalist admission. Patient is to be kept NPO after midnight.  Will place call for hospitalist admission.   6:45 PM spoke to Dr. Posey Pronto who will admit.   Final Clinical Impressions(s) / ED Diagnoses   Final diagnoses:  Closed subcapital fracture of femur, left, initial encounter [facility])  Fall, initial encounter    ED Discharge Orders    None       Janeece Fitting, [place] 02/05/19 1845    Duffy Bruce, MD 02/07/19 1525

## 2019-02-06 ENCOUNTER — Inpatient Hospital Stay (HOSPITAL_COMMUNITY): Payer: Medicare Other | Admitting: Certified Registered"

## 2019-02-06 ENCOUNTER — Encounter (HOSPITAL_COMMUNITY)
Admission: EM | Disposition: A | Discharge status: Home Health | Payer: Self-pay | Source: Home / Self Care | Attending: Internal Medicine

## 2019-02-06 ENCOUNTER — Inpatient Hospital Stay (HOSPITAL_COMMUNITY): Payer: Medicare Other

## 2019-02-06 DIAGNOSIS — S72002A Fracture of unspecified part of neck of left femur, initial encounter for closed fracture: Secondary | ICD-10-CM | POA: Diagnosis present

## 2019-02-06 HISTORY — PX: TOTAL HIP ARTHROPLASTY: SHX124

## 2019-02-06 LAB — CBC
HCT: 35.5 % — ABNORMAL LOW (ref 36.0–46.0)
Hemoglobin: 11.4 g/dL — ABNORMAL LOW (ref 12.0–15.0)
MCH: 31.8 pg (ref 26.0–34.0)
MCHC: 32.1 g/dL (ref 30.0–36.0)
MCV: 98.9 fL (ref 80.0–100.0)
Platelets: 209 10*3/uL (ref 150–400)
RBC: 3.59 MIL/uL — ABNORMAL LOW (ref 3.87–5.11)
RDW: 14.1 % (ref 11.5–15.5)
WBC: 4.9 10*3/uL (ref 4.0–10.5)
nRBC: 0 % (ref 0.0–0.2)

## 2019-02-06 LAB — MRSA PCR SCREENING: MRSA by PCR: NEGATIVE

## 2019-02-06 LAB — RENAL FUNCTION PANEL
Albumin: 3.1 g/dL — ABNORMAL LOW (ref 3.5–5.0)
Anion gap: 14 (ref 5–15)
BUN: 54 mg/dL — ABNORMAL HIGH (ref 8–23)
CO2: 28 mmol/L (ref 22–32)
Calcium: 9.3 mg/dL (ref 8.9–10.3)
Chloride: 95 mmol/L — ABNORMAL LOW (ref 98–111)
Creatinine, Ser: 7.11 mg/dL — ABNORMAL HIGH (ref 0.44–1.00)
GFR calc Af Amer: 7 mL/min — ABNORMAL LOW (ref >60)
GFR calc non Af Amer: 6 mL/min — ABNORMAL LOW (ref >60)
Glucose, Bld: 106 mg/dL — ABNORMAL HIGH (ref 70–99)
Phosphorus: 5.2 mg/dL — ABNORMAL HIGH (ref 2.5–4.6)
Potassium: 4.2 mmol/L (ref 3.5–5.1)
Sodium: 137 mmol/L (ref 135–145)

## 2019-02-06 LAB — HIV ANTIBODY (ROUTINE TESTING W REFLEX): HIV Screen 4th Generation wRfx: NONREACTIVE

## 2019-02-06 SURGERY — ARTHROPLASTY, HIP, TOTAL, ANTERIOR APPROACH
Anesthesia: Monitor Anesthesia Care | Site: Hip | Laterality: Left

## 2019-02-06 MED ORDER — FENTANYL CITRATE (PF) 100 MCG/2ML IJ SOLN
25.0000 ug | INTRAMUSCULAR | Status: DC | PRN
  Administered 2019-02-06: 16:00:00 50 ug via INTRAVENOUS

## 2019-02-06 MED ORDER — PROPOFOL 10 MG/ML IV BOLUS
INTRAVENOUS | Status: DC | PRN
  Administered 2019-02-06 (×3): 20 mg via INTRAVENOUS

## 2019-02-06 MED ORDER — KETOROLAC TROMETHAMINE 30 MG/ML IJ SOLN
INTRAMUSCULAR | Status: AC
  Filled 2019-02-06: qty 1

## 2019-02-06 MED ORDER — METOCLOPRAMIDE HCL 5 MG/ML IJ SOLN: 5.0000 mg | Freq: Three times a day (TID) | INTRAMUSCULAR | Status: DC | PRN

## 2019-02-06 MED ORDER — CEFAZOLIN SODIUM-DEXTROSE 2-4 GM/100ML-% IV SOLN
2.0000 g | Freq: Once | INTRAVENOUS | Status: AC
  Administered 2019-02-07: 2 g via INTRAVENOUS
  Filled 2019-02-06 (×2): qty 100

## 2019-02-06 MED ORDER — PENTAFLUOROPROP-TETRAFLUOROETH EX AERO: 1.0000 "application " | INHALATION_SPRAY | CUTANEOUS | Status: DC | PRN

## 2019-02-06 MED ORDER — ENSURE PRE-SURGERY PO LIQD
296.0000 mL | Freq: Once | ORAL | Status: AC
  Administered 2019-02-06: 296 mL via ORAL
  Filled 2019-02-06: qty 296

## 2019-02-06 MED ORDER — BUPIVACAINE-EPINEPHRINE (PF) 0.5% -1:200000 IJ SOLN
INTRAMUSCULAR | Status: AC
  Filled 2019-02-06: qty 30

## 2019-02-06 MED ORDER — ONDANSETRON HCL 4 MG/2ML IJ SOLN
INTRAMUSCULAR | Status: DC | PRN
  Administered 2019-02-06: 4 mg via INTRAVENOUS

## 2019-02-06 MED ORDER — HYDROMORPHONE HCL 1 MG/ML IJ SOLN
1.0000 mg | INTRAMUSCULAR | Status: DC | PRN
  Administered 2019-02-06: 1 mg via INTRAVENOUS
  Filled 2019-02-06: qty 1

## 2019-02-06 MED ORDER — CEFAZOLIN SODIUM-DEXTROSE 2-4 GM/100ML-% IV SOLN
2.0000 g | INTRAVENOUS | Status: AC
  Administered 2019-02-06: 2 g via INTRAVENOUS
  Filled 2019-02-06: qty 100

## 2019-02-06 MED ORDER — CALCITRIOL 0.25 MCG PO CAPS
ORAL_CAPSULE | ORAL | Status: AC
  Administered 2019-02-07: 0.25 ug via ORAL
  Filled 2019-02-06: qty 1

## 2019-02-06 MED ORDER — FENTANYL CITRATE (PF) 250 MCG/5ML IJ SOLN
INTRAMUSCULAR | Status: DC | PRN
  Administered 2019-02-06 (×5): 50 ug via INTRAVENOUS

## 2019-02-06 MED ORDER — KETOROLAC TROMETHAMINE 30 MG/ML IJ SOLN
INTRAMUSCULAR | Status: DC | PRN
  Administered 2019-02-06: 30 mg

## 2019-02-06 MED ORDER — ONDANSETRON HCL 4 MG/2ML IJ SOLN: 4.0000 mg | Freq: Once | INTRAMUSCULAR | Status: DC | PRN

## 2019-02-06 MED ORDER — FENTANYL CITRATE (PF) 250 MCG/5ML IJ SOLN
INTRAMUSCULAR | Status: AC
  Filled 2019-02-06: qty 5

## 2019-02-06 MED ORDER — 0.9 % SODIUM CHLORIDE (POUR BTL) OPTIME
TOPICAL | Status: DC | PRN
  Administered 2019-02-06: 1000 mL

## 2019-02-06 MED ORDER — HEPARIN SODIUM (PORCINE) 1000 UNIT/ML DIALYSIS
1000.0000 [IU] | INTRAMUSCULAR | Status: DC | PRN
  Filled 2019-02-06: qty 1

## 2019-02-06 MED ORDER — POVIDONE-IODINE 10 % EX SWAB: 2.0000 "application " | Freq: Once | CUTANEOUS | Status: DC

## 2019-02-06 MED ORDER — METOCLOPRAMIDE HCL 5 MG PO TABS: 5.0000 mg | ORAL_TABLET | Freq: Three times a day (TID) | ORAL | Status: DC | PRN

## 2019-02-06 MED ORDER — EPHEDRINE SULFATE-NACL 50-0.9 MG/10ML-% IV SOSY
PREFILLED_SYRINGE | INTRAVENOUS | Status: DC | PRN
  Administered 2019-02-06: 5 mg via INTRAVENOUS

## 2019-02-06 MED ORDER — ASPIRIN 81 MG PO CHEW
81.0000 mg | CHEWABLE_TABLET | Freq: Two times a day (BID) | ORAL | Status: DC
  Administered 2019-02-07: 08:00:00 81 mg via ORAL
  Filled 2019-02-06: qty 1

## 2019-02-06 MED ORDER — LANTHANUM CARBONATE 500 MG PO CHEW
1000.0000 mg | CHEWABLE_TABLET | Freq: Three times a day (TID) | ORAL | Status: DC
  Administered 2019-02-06 – 2019-02-09 (×5): 1000 mg via ORAL
  Filled 2019-02-06 (×11): qty 2

## 2019-02-06 MED ORDER — ALTEPLASE 2 MG IJ SOLR: 2.0000 mg | Freq: Once | INTRAMUSCULAR | Status: DC | PRN

## 2019-02-06 MED ORDER — ONDANSETRON HCL 4 MG PO TABS: 4.0000 mg | ORAL_TABLET | Freq: Four times a day (QID) | ORAL | Status: DC | PRN

## 2019-02-06 MED ORDER — FENTANYL CITRATE (PF) 100 MCG/2ML IJ SOLN
INTRAMUSCULAR | Status: AC
  Filled 2019-02-06: qty 2

## 2019-02-06 MED ORDER — SENNA 8.6 MG PO TABS
1.0000 | ORAL_TABLET | Freq: Two times a day (BID) | ORAL | Status: DC
  Administered 2019-02-07 – 2019-02-09 (×2): 8.6 mg via ORAL
  Filled 2019-02-06 (×6): qty 1

## 2019-02-06 MED ORDER — LIDOCAINE HCL (PF) 1 % IJ SOLN: 5.0000 mL | INTRAMUSCULAR | Status: DC | PRN

## 2019-02-06 MED ORDER — SODIUM CHLORIDE 0.9 % IV SOLN
INTRAVENOUS | Status: AC | PRN
  Administered 2019-02-06: 1000 mL

## 2019-02-06 MED ORDER — SODIUM CHLORIDE 0.9 % IV SOLN: 100.0000 mL | INTRAVENOUS | Status: DC | PRN

## 2019-02-06 MED ORDER — CISATRACURIUM BESYLATE 20 MG/10ML IV SOLN
INTRAVENOUS | Status: AC
  Filled 2019-02-06: qty 10

## 2019-02-06 MED ORDER — BUPIVACAINE-EPINEPHRINE (PF) 0.5% -1:200000 IJ SOLN
INTRAMUSCULAR | Status: DC | PRN
  Administered 2019-02-06: 30 mL

## 2019-02-06 MED ORDER — SODIUM CHLORIDE 0.9 % IV SOLN
INTRAVENOUS | Status: DC | PRN
  Administered 2019-02-06: 30 ug/min via INTRAVENOUS

## 2019-02-06 MED ORDER — CHLORHEXIDINE GLUCONATE 4 % EX LIQD: 60.0000 mL | Freq: Once | CUTANEOUS | Status: DC

## 2019-02-06 MED ORDER — ONDANSETRON HCL 4 MG/2ML IJ SOLN: 4.0000 mg | Freq: Four times a day (QID) | INTRAMUSCULAR | Status: DC | PRN

## 2019-02-06 MED ORDER — PROPOFOL 10 MG/ML IV BOLUS
INTRAVENOUS | Status: AC
  Filled 2019-02-06: qty 20

## 2019-02-06 MED ORDER — CEFAZOLIN SODIUM-DEXTROSE 2-4 GM/100ML-% IV SOLN: 2.0000 g | Freq: Four times a day (QID) | INTRAVENOUS | Status: DC

## 2019-02-06 MED ORDER — LIDOCAINE-PRILOCAINE 2.5-2.5 % EX CREA
1.0000 "application " | TOPICAL_CREAM | CUTANEOUS | Status: DC | PRN
  Filled 2019-02-06: qty 5

## 2019-02-06 MED ORDER — SODIUM CHLORIDE 0.9 % IV SOLN
INTRAVENOUS | Status: DC | PRN
  Administered 2019-02-06 (×2): via INTRAVENOUS

## 2019-02-06 MED ORDER — SODIUM CHLORIDE 0.9 % IV SOLN
INTRAVENOUS | Status: DC
  Administered 2019-02-06: 14:00:00 via INTRAVENOUS

## 2019-02-06 MED ORDER — SODIUM CHLORIDE (PF) 0.9 % IJ SOLN
INTRAMUSCULAR | Status: DC | PRN
  Administered 2019-02-06: 30 mL

## 2019-02-06 MED ORDER — TRANEXAMIC ACID-NACL 1000-0.7 MG/100ML-% IV SOLN: 1000.0000 mg | INTRAVENOUS | Status: DC

## 2019-02-06 MED ORDER — ACETAMINOPHEN 10 MG/ML IV SOLN
INTRAVENOUS | Status: AC
  Filled 2019-02-06: qty 100

## 2019-02-06 MED ORDER — ACETAMINOPHEN 10 MG/ML IV SOLN
INTRAVENOUS | Status: DC | PRN
  Administered 2019-02-06: 1000 mg via INTRAVENOUS

## 2019-02-06 MED ORDER — PROPOFOL 500 MG/50ML IV EMUL
INTRAVENOUS | Status: DC | PRN
  Administered 2019-02-06: 25 ug/kg/min via INTRAVENOUS

## 2019-02-06 MED ORDER — SODIUM CHLORIDE 0.9 % IR SOLN
Status: DC | PRN
  Administered 2019-02-06: 3000 mL

## 2019-02-06 SURGICAL SUPPLY — 64 items
ADH SKN CLS APL DERMABOND .7 (GAUZE/BANDAGES/DRESSINGS) ×2
ALCOHOL ISOPROPYL (RUBBING) (MISCELLANEOUS) ×2 IMPLANT
BLADE CLIPPER SURG (BLADE) IMPLANT
CHLORAPREP W/TINT 26ML (MISCELLANEOUS) ×2 IMPLANT
COVER SURGICAL LIGHT HANDLE (MISCELLANEOUS) ×2 IMPLANT
COVER WAND RF STERILE (DRAPES) ×2 IMPLANT
CUP SECTOR GRIPTON 50MM (Cup) ×1 IMPLANT
DERMABOND ADVANCED (GAUZE/BANDAGES/DRESSINGS) ×2
DERMABOND ADVANCED .7 DNX12 (GAUZE/BANDAGES/DRESSINGS) ×2 IMPLANT
DRAPE C-ARM 42X72 X-RAY (DRAPES) ×2 IMPLANT
DRAPE STERI IOBAN 125X83 (DRAPES) ×2 IMPLANT
DRAPE U-SHAPE 47X51 STRL (DRAPES) ×5 IMPLANT
DRESSING AQUACEL AG SP 3.5X10 (GAUZE/BANDAGES/DRESSINGS) IMPLANT
DRSG AQUACEL AG ADV 3.5X10 (GAUZE/BANDAGES/DRESSINGS) ×1 IMPLANT
DRSG AQUACEL AG SP 3.5X10 (GAUZE/BANDAGES/DRESSINGS) ×2
ELECT BLADE 4.0 EZ CLEAN MEGAD (MISCELLANEOUS) ×2
ELECT PENCIL ROCKER SW 15FT (MISCELLANEOUS) ×2 IMPLANT
ELECT REM PT RETURN 9FT ADLT (ELECTROSURGICAL) ×2
ELECTRODE BLDE 4.0 EZ CLN MEGD (MISCELLANEOUS) ×1 IMPLANT
ELECTRODE REM PT RTRN 9FT ADLT (ELECTROSURGICAL) ×1 IMPLANT
EVACUATOR 1/8 PVC DRAIN (DRAIN) IMPLANT
GLOVE BIO SURGEON STRL SZ8.5 (GLOVE) ×5 IMPLANT
GLOVE BIOGEL PI IND STRL 8.5 (GLOVE) ×1 IMPLANT
GLOVE BIOGEL PI INDICATOR 8.5 (GLOVE) ×1
GOWN STRL REUS W/ TWL LRG LVL3 (GOWN DISPOSABLE) ×2 IMPLANT
GOWN STRL REUS W/TWL 2XL LVL3 (GOWN DISPOSABLE) ×2 IMPLANT
GOWN STRL REUS W/TWL LRG LVL3 (GOWN DISPOSABLE) ×8
HANDPIECE INTERPULSE COAX TIP (DISPOSABLE) ×2
HEAD FEMORAL 32 CERAMIC (Hips) ×1 IMPLANT
HOOD PEEL AWAY FACE SHEILD DIS (HOOD) ×6 IMPLANT
KIT BASIN OR (CUSTOM PROCEDURE TRAY) ×2 IMPLANT
KIT TURNOVER KIT B (KITS) ×2 IMPLANT
LINER ACET PNNCL PLUS4 NEUTRAL (Hips) IMPLANT
MANIFOLD NEPTUNE II (INSTRUMENTS) ×2 IMPLANT
MARKER SKIN DUAL TIP RULER LAB (MISCELLANEOUS) ×4 IMPLANT
NDL SPNL 18GX3.5 QUINCKE PK (NEEDLE) ×1 IMPLANT
NEEDLE SPNL 18GX3.5 QUINCKE PK (NEEDLE) ×4 IMPLANT
NS IRRIG 1000ML POUR BTL (IV SOLUTION) ×2 IMPLANT
PACK TOTAL JOINT (CUSTOM PROCEDURE TRAY) ×2 IMPLANT
PACK UNIVERSAL I (CUSTOM PROCEDURE TRAY) ×2 IMPLANT
PAD ARMBOARD 7.5X6 YLW CONV (MISCELLANEOUS) ×6 IMPLANT
PINNACLE PLUS 4 NEUTRAL (Hips) ×2 IMPLANT
SAW OSC TIP CART 19.5X105X1.3 (SAW) ×2 IMPLANT
SEALER BIPOLAR AQUA 6.0 (INSTRUMENTS) ×1 IMPLANT
SET HNDPC FAN SPRY TIP SCT (DISPOSABLE) ×1 IMPLANT
SOL PREP POV-IOD 4OZ 10% (MISCELLANEOUS) ×1 IMPLANT
STAPLER VISISTAT 35W (STAPLE) ×1 IMPLANT
STEM TRI LOC BPS SZ7 W GRIPTON (Hips) IMPLANT
SUT ETHIBOND NAB CT1 #1 30IN (SUTURE) ×4 IMPLANT
SUT MNCRL AB 3-0 PS2 18 (SUTURE) ×2 IMPLANT
SUT MON AB 2-0 CT1 36 (SUTURE) ×3 IMPLANT
SUT VIC AB 1 CT1 27 (SUTURE) ×2
SUT VIC AB 1 CT1 27XBRD ANBCTR (SUTURE) ×1 IMPLANT
SUT VIC AB 2-0 CT1 27 (SUTURE) ×6
SUT VIC AB 2-0 CT1 TAPERPNT 27 (SUTURE) ×1 IMPLANT
SUT VLOC 180 0 24IN GS25 (SUTURE) ×2 IMPLANT
SYR 50ML LL SCALE MARK (SYRINGE) ×2 IMPLANT
TOWEL OR 17X24 6PK STRL BLUE (TOWEL DISPOSABLE) ×2 IMPLANT
TOWEL OR 17X26 10 PK STRL BLUE (TOWEL DISPOSABLE) ×2 IMPLANT
TRAY CATH 16FR W/PLASTIC CATH (SET/KITS/TRAYS/PACK) IMPLANT
TRAY FOLEY CATH SILVER 16FR (SET/KITS/TRAYS/PACK) IMPLANT
TRI LOC BPS SZ 7 W GRIPTON (Hips) ×2 IMPLANT
WATER STERILE IRR 1000ML POUR (IV SOLUTION) ×5 IMPLANT
YANKAUER SUCT BULB TIP NO VENT (SUCTIONS) ×1 IMPLANT

## 2019-02-06 NOTE — Progress Notes (Signed)
Subjective:  Hemodynamically stable, pain controlled overnight- plan for surgery today at 2-- so appears will need HD after    Objective Vital signs in last 24 hours: Vitals:   02/05/19 1734 02/05/19 2041 02/05/19 2107 02/06/19 0418  BP: (!) 157/74 (!) 151/68 (!) 155/75 (!) 148/68  Pulse: 62 64 (!) 54 (!) 58  Resp: 14 14 14 14   Temp:   98.3 F (36.8 C) (!) 97.5 F (36.4 C)  TempSrc:   Oral Oral  SpO2: 100% 99% 100% 100%  Weight:   55.4 kg   Height:   5\' 5"  (1.651 m)    Weight change:  No intake or output data in the 24 hours ending 02/06/19 0938  Dialyzes at [facility]- TTS 4 hours  EDW 56. HD Bath 2/2/, Dialyzer 180, Heparin yes- 6000 per tx. Access left AVG.  . Gets epo 5000  Tiw,calcitriol 0.25 tiw.  sensipar 30 daily and fosrenol for binding.  Last labs as OP hgb 10.9, phos 4.0 and pth 526   Assessment/Plan: 63 year old WF- ESRD s/p failed transplant- presents after mechanical fall and hip fracture  1 Hip fracture-  For possible operative repair today- pain control 2 ESRD: normally TTS via AVG- will plan for routine HD today-  coordinate with surgery  3 Hypertension: pt tends to get orthostatic as OP- on no BP meds- EDW seems appropriate as OP as well- UF to goal  4. Anemia of ESRD: actually gets epogen- I think because of her concerns with other ESA's-  hgb above goal right now, will hold ESA and follow 5. Metabolic Bone Disease: cont her home calcitriol, fosrenol and sensipar    Louis Meckel    Labs: Basic Metabolic Panel: Recent Labs  Lab 02/05/19 1750 02/06/19 0218  NA 138 137  K 4.3 4.2  CL 95* 95*  CO2 29 28  GLUCOSE 108* 106*  BUN 47* 54*  CREATININE 6.72* 7.11*  CALCIUM 9.5 9.3  PHOS  --  5.2*   Liver Function Tests: Recent Labs  Lab 02/05/19 1750 02/06/19 0218  AST 14*  --   ALT 12  --   ALKPHOS 196*  --   BILITOT 0.7  --   PROT 6.6  --   ALBUMIN 3.3* 3.1*   No results for input(s): LIPASE, AMYLASE in the last 168 hours. No results for  input(s): AMMONIA in the last 168 hours. CBC: Recent Labs  Lab 02/05/19 1750 02/06/19 0218  WBC 7.6 4.9  NEUTROABS 6.2  --   HGB 12.1 11.4*  HCT 37.4 35.5*  MCV 97.4 98.9  PLT 206 209   Cardiac Enzymes: No results for input(s): CKTOTAL, CKMB, CKMBINDEX, TROPONINI in the last 168 hours. CBG: No results for input(s): GLUCAP in the last 168 hours.  Iron Studies: No results for input(s): IRON, TIBC, TRANSFERRIN, FERRITIN in the last 72 hours. Studies/Results: Ct Hip Left Wo Contrast  Result Date: 02/05/2019 CLINICAL DATA:  Left hip pain after a fall out of bed. Initial encounter. EXAM: CT OF THE LEFT HIP WITHOUT CONTRAST TECHNIQUE: Multidetector CT imaging of the left hip was performed according to the standard protocol. Multiplanar CT image reconstructions were also generated. COMPARISON:  Plain films left hip earlier today. FINDINGS: Bones/Joint/Cartilage As seen on the comparison examination, the patient has an acute subcapital fracture of the left hip. The superior aspect of the fracture is impacted 1.3 cm. There is minimal anterior displacement femoral neck. No other is seen. No lytic or sclerotic lesion. Ligaments  Suboptimally assessed by CT. Muscles and Tendons Intact. Soft tissues Contrast in the colon and diverticulosis noted. IMPRESSION: Acute subcapital fracture left hip. Electronically Signed   By: Inge Rise M.D.   On: 02/05/2019 19:59   Dg Hip Unilat W Or Wo Pelvis 2-3 Views Left  Result Date: 02/05/2019 CLINICAL DATA:  Left hip pain due to a fall today. EXAM: DG HIP (WITH OR WITHOUT PELVIS) 2-3V LEFT COMPARISON:  None. FINDINGS: The patient has an acute subcapital fracture of the left hip. No other acute bony or joint abnormality. Contrast material in the colon is noted. Diverticulosis scratch the diverticular disease is seen. IMPRESSION: Acute subcapital fracture left hip. Electronically Signed   By: Inge Rise M.D.   On: 02/05/2019 17:56    Medications: Infusions: . sodium chloride    . sodium chloride    .  ceFAZolin (ANCEF) IV    . tranexamic acid      Scheduled Medications: . amLODipine  5 mg Oral QHS  . calcitRIOL  0.25 mcg Oral Q T,Th,Sa-HD  . chlorhexidine  60 mL Topical Once  . Chlorhexidine Gluconate Cloth  6 each Topical Q0600  . cinacalcet  30 mg Oral Q supper  . fluticasone furoate-vilanterol  1 puff Inhalation QPM  . [START ON 02/09/2019] levothyroxine  75 mcg Oral Once per day on Tue Thu   And  . levothyroxine  50 mcg Oral Once per day on Sun Mon Wed Fri Sat  . povidone-iodine  2 application Topical Once  . umeclidinium bromide  1 puff Inhalation Daily    have reviewed scheduled and prn medications.  Physical Exam: General: NAD, lying flat Heart: RRR Lungs: mostly clear to ant exam Abdomen: soft, non tender Extremities: no edema Dialysis Access: left AVF patent    02/06/2019,9:38 AM  LOS: 1 day

## 2019-02-06 NOTE — Consult Note (Signed)
ORTHOPAEDIC CONSULTATION  REQUESTING PHYSICIAN: Domenic Polite, MD  PCP:  [name], MD  Chief Complaint: Left hip pain  HPI: Kimberly [name] is a 63 y.o. female with a history of ESRD on hemodialysis Tuesday/Thursday/Saturday, history of failed renal transplant, CVA, hypertension, hypothyroidism, asthma sustained a ground-level fall yesterday and she landed on her left hip.  She had left hip pain and inability to weight-bear.  She was taken to the emergency department at [facility], where x-rays and a CT scan of the left hip showed a comminuted impacted subcapital femoral neck fracture with retroversion of the head.  She was admitted to the hospitalist service for perioperative risk stratification and medical optimization.  I was asked to see the patient by Dr. Doreatha Martin.  Past Medical History:  Diagnosis Date  . Anemia   . Asthma   . Complication of anesthesia    Slow to awaken and AMS- took 1 month to memory and recall to return  . Diabetes mellitus    medication induced with  steroids , 05/19/18 - last A1C was 4.something  . Diverticulosis   . Dyspnea    with exertion. when walking up stairs or walking a distance  . First degree AV block   . GERD (gastroesophageal reflux disease)   . Heart murmur    child  . History of blood transfusion   . History of chronic cough   . History of hiatal hernia   . History of hoarseness   . Hypertension    history; resolved on dialysis  . Hypothyroidism   . IgA nephropathy    ESRD - Hemo TTH Sat  . Immunosuppression ([place])   . Kidney transplanted 01/2010   [facility], has had some rejection  . Patient on peritoneal dialysis ([place])   . Pneumonia   . Premature atrial beat   . Premature ventricular beat   . Renal transplant failure and rejection 2012  . Shingles 03/2011  . Stroke [facility]) October 2010, March 2013   cerebral aneurysm, 07/2017   Past Surgical History:  Procedure Laterality Date  . AV FISTULA PLACEMENT Left 05/20/2018   Procedure: INSERTION OF ARTERIOVENOUS (AV) GORE-TEX GRAFT LEFT  ARM;  Surgeon: [name], MD;  Location: [place];  Service: Vascular;  Laterality: Left;  . CAPD INSERTION  08/25/2008   open, Dr [name]  . CAPD INSERTION N/A 12/14/2013   Procedure: LAPAROSCOPIC INSERTION CONTINUOUS AMBULATORY PERITONEAL DIALYSIS  (CAPD) CATHETER;  Surgeon: [name], MD;  Location: [place];  Service: General;  Laterality: N/A;  . CAPD REMOVAL  03/22/2010   Dr [name]  . COLONOSCOPY    . ESOPHAGOGASTRODUODENOSCOPY    . EYE SURGERY Bilateral    radial keratotomy  . Hemodialysis catheter Right 02/13/2018  . INSERTION OF MESH N/A 12/14/2013   Procedure: INSERTION OF MESH;  Surgeon: [name], MD;  Location: [place];  Service: General;  Laterality: N/A;  . KIDNEY TRANSPLANT Right 01/2010   [place]  . LAPAROSCOPIC LYSIS OF ADHESIONS N/A 12/14/2013   Procedure: LAPAROSCOPIC LYSIS OF ADHESIONS;  Surgeon: [name], MD;  Location: [place];  Service: General;  Laterality: N/A;  . UMBILICAL HERNIA REPAIR  10/25/2009   open with mesh,  Dr [name]  . URETER REVISION  04/2011   [facility]  . VENTRAL HERNIA REPAIR N/A 12/14/2013   Procedure: LAPAROSCOPIC VENTRAL HERNIA;  Surgeon: [name], MD;  Location: [place];  Service: General;  Laterality: N/A;  . VIDEO ASSISTED THORACOSCOPY (  VATS)/WEDGE RESECTION Left 09/01/2014   Procedure: VIDEO ASSISTED THORACOSCOPY (VATS)/WEDGE RESECTION;  Surgeon: [name], MD;  Location: [place];  Service: Thoracic;  Laterality: Left;  [place] Kitchen VIDEO BRONCHOSCOPY Bilateral 07/05/2014   Procedure: VIDEO BRONCHOSCOPY WITH FLUORO;  Surgeon: [name], MD;  Location: [facility];  Service: Cardiopulmonary;  Laterality: Bilateral;   Social History   Socioeconomic History  . Marital status: Married    Spouse name: Not on file  . Number of children: 1  . Years of education: Not on file  . Highest education level: Not on file  Occupational History  . Occupation:  retired  Scientific laboratory technician  . Financial resource strain: Not on file  . Food insecurity:    Worry: Not on file    Inability: Not on file  . Transportation needs:    Medical: Not on file    Non-medical: Not on file  Tobacco Use  . Smoking status: Never Smoker  . Smokeless tobacco: Never Used  Substance and Sexual Activity  . Alcohol use: Yes    Comment: ocassional- not weekly  . Drug use: No  . Sexual activity: Yes    Birth control/protection: Post-menopausal  Lifestyle  . Physical activity:    Days per week: Not on file    Minutes per session: Not on file  . Stress: Not on file  Relationships  . Social connections:    Talks on phone: Not on file    Gets together: Not on file    Attends religious service: Not on file    Active member of club or organization: Not on file    Attends meetings of clubs or organizations: Not on file    Relationship status: Not on file  Other Topics Concern  . Not on file  Social History Narrative   Pt lives in [place].  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   Family History  Problem Relation Age of Onset  . Coronary artery disease Mother   . Emphysema Mother        smoked  . Ovarian cancer Mother   . Heart disease Mother   . Stroke Maternal Uncle   . Diabetes Maternal Uncle   . Kidney disease Maternal Uncle   . Liver disease Father    Allergies  Allergen Reactions  . Mircera [Methoxy Polyethylene Glycol-Epoetin Beta] Other (See Comments)    Severe joint and muscle pain  . Dapsone Rash and Other (See Comments)    Pain in feet  . Pregabalin Rash and Other (See Comments)    fever  . Sulfonamide Derivatives Rash  . Tramadol Rash and Other (See Comments)    Rash on cheek   Prior to Admission medications   Medication Sig Start Date End Date Taking? Authorizing Provider  amLODipine (NORVASC) 5 MG tablet Take 5 mg by mouth at bedtime.   Yes [provider]  aspirin EC 325 MG tablet Take 1 tablet (325  mg total) by mouth daily. Patient taking differently: Take 325 mg by mouth every evening.  09/18/17  Yes Melvenia Beam, MD  cinacalcet (SENSIPAR) 30 MG tablet Take 30 mg by mouth daily after supper.   Yes [provider]  EPOETIN ALFA IJ Inject into the skin See admin instructions. Inject  subcutaneously at dialysis as needed for low hemoglobin   Yes [provider]  fluticasone furoate-vilanterol (BREO ELLIPTA) 200-25 MCG/INH AEPB Inhale 1 puff into the lungs every evening.   Yes [provider]  hydroxychloroquine (PLAQUENIL) 200 MG tablet Take 200 mg by mouth daily with lunch.   Yes [provider]  ibuprofen (ADVIL) 200 MG tablet Take 400 mg by mouth daily as needed (arthritis pain).   Yes [provider]  lanthanum (FOSRENOL) 1000 MG chewable tablet Chew 1,000 mg by mouth 3 (three) times daily with meals.   Yes [provider]  levothyroxine (SYNTHROID, LEVOTHROID) 50 MCG tablet Take 50-75 mcg by mouth See admin instructions. Take  1 1/2 tablets (75 mcg) by mouth daily on Tuesday and Thursday nights, take 1 tablet (50 mcg) by mouth daily on all other nights of the week   Yes [provider]  OVER THE COUNTER MEDICATION Take 4 capsules by mouth 2 (two) times a day. Juice plus - 1 fruit, 1 vegetable, 1 berries, 1 omega - twice daily   Yes [provider]  PSYLLIUM PO Take 3 capsules by mouth every evening.   Yes [provider]  temazepam (RESTORIL) 15 MG capsule Take 15 mg by mouth at bedtime as needed for sleep.   Yes [provider]  Tiotropium Bromide Monohydrate (SPIRIVA RESPIMAT) 1.25 MCG/ACT AERS Inhale 2 puffs into the lungs daily.    Yes [provider]  oxyCODONE-acetaminophen (PERCOCET) 5-325 MG tablet Take 1 tablet by mouth every 6 (six) hours as needed for severe pain. Patient not taking: Reported on 02/05/2019 05/20/18   Gabriel Earing, PA-C   Ct Hip Left Wo Contrast  Result Date:  02/05/2019 CLINICAL DATA:  Left hip pain after a fall out of bed. Initial encounter. EXAM: CT OF THE LEFT HIP WITHOUT CONTRAST TECHNIQUE: Multidetector CT imaging of the left hip was performed according to the standard protocol. Multiplanar CT image reconstructions were also generated. COMPARISON:  Plain films left hip earlier today. FINDINGS: Bones/Joint/Cartilage As seen on the comparison examination, the patient has an acute subcapital fracture of the left hip. The superior aspect of the fracture is impacted 1.3 cm. There is minimal anterior displacement femoral neck. No other is seen. No lytic or sclerotic lesion. Ligaments Suboptimally assessed by CT. Muscles and Tendons Intact. Soft tissues Contrast in the colon and diverticulosis noted. IMPRESSION: Acute subcapital fracture left hip. Electronically Signed   By: Inge Rise M.D.   On: 02/05/2019 19:59   Dg Hip Unilat W Or Wo Pelvis 2-3 Views Left  Result Date: 02/05/2019 CLINICAL DATA:  Left hip pain due to a fall today. EXAM: DG HIP (WITH OR WITHOUT PELVIS) 2-3V LEFT COMPARISON:  None. FINDINGS: The patient has an acute subcapital fracture of the left hip. No other acute bony or joint abnormality. Contrast material in the colon is noted. Diverticulosis scratch the diverticular disease is seen. IMPRESSION: Acute subcapital fracture left hip. Electronically Signed   By: Inge Rise M.D.   On: 02/05/2019 17:56    Positive ROS: All other systems have been reviewed and were otherwise negative with the exception of those mentioned in the HPI and as above.  Physical Exam: General: Alert, no acute distress Cardiovascular: No pedal edema Respiratory: No cyanosis, no use of accessory musculature GI: No organomegaly, abdomen is soft and non-tender Skin: No lesions in the area of chief complaint Neurologic: Sensation intact distally Psychiatric: Patient is competent for consent with normal mood and affect Lymphatic: No axillary or cervical  lymphadenopathy  MUSCULOSKELETAL: Examination of the left hip reveals no skin wounds or lesions.  She is mildly shortened.  She has pain with logrolling of the hip.  She is neurovascularly intact distally.  Assessment: Multiple medical problems. Displaced left femoral neck fracture.  Plan: I discussed the findings with the patient and her husband over the phone.  Given her young age and fracture pattern, she has been indicated for total hip replacement.  We discussed the risk, benefits, and alternatives.  Please see statement of risk.  She has an elevated risk of perioperative complications and wound healing issues due to dialysis.  Nevertheless, she would benefit the surgery for pain control and to allow for mobilization.  We will plan for surgery today.  All questions were solicited and answered.  The risks, benefits, and alternatives were discussed with the patient. There are risks associated with the surgery including, but not limited to, problems with anesthesia (death), infection, instability (giving out of the joint), dislocation, differences in leg length/angulation/rotation, fracture of bones, loosening or failure of implants, hematoma (blood accumulation) which may require surgical drainage, blood clots, pulmonary embolism, nerve injury (foot drop and lateral thigh numbness), and blood vessel injury. The patient understands these risks and elects to proceed.    Bertram Savin, MD Cell [phone]    02/06/2019 2:29 PM

## 2019-02-06 NOTE — Progress Notes (Signed)
PROGRESS NOTE    Kimberly [name]  GNO:[id number] DOB: [birth year] DOA: 02/05/2019 PCP: [name], MD  Brief Narrative: 63 year old female with history of ESRD on hemodialysis Tuesday Thursday Saturday, history of failed renal transplant, history of CVA, hypertension, hypothyroidism, asthma presented to the emergency room following a mechanical fall on 5/15. -On further evaluation she was noted to have acute subcapital fracture of left hip orthopedics Dr. Doreatha Martin was consulted  Assessment & Plan:   Acute subcapital fracture left hip: -Following a mechanical fall  -Orthopedics consulted, plan for surgery this afternoon  -She has moderate cardiac risk of complications due to multiple comorbidities, however no history of CAD CHF or valvular heart disease  -Pain control -Will need DVT prophylaxis postop  ESRD on TTS HD: -Nephrology following, plan for dialysis today, postop  Hx of CVA: History of cryptogenic stroke, embolic in nature.  Follows with neurology as an outpatient, had plans for TEE/ILR  -She is on aspirin 325 mg alone as she has wanted to avoid coumadin per prior documentation. -Aspirin on hold for surgery, will restart as soon as possible  HTN: -Stable, resume home amlodipine 5 mg daily.  Asthma: -Stable -Continue Brio Ellipta, Spiriva, and as needed albuterol  Hypothyroidism: -Continue home Synthroid   DVT prophylaxis: SCDs, add heparin postop Code Status: Full code, confirmed with patient Family Communication: None present on admission Disposition Plan:  Would likely need rehab postop  Consultants:   Renal  Orthopedics   Procedures:   Antimicrobials:    Subjective: -Continues to have discomfort in her left hip  Objective: Vitals:   02/05/19 1734 02/05/19 2041 02/05/19 2107 02/06/19 0418  BP: (!) 157/74 (!) 151/68 (!) 155/75 (!) 148/68  Pulse: 62 64 (!) 54 (!) 58  Resp: 14 14 14 14   Temp:   98.3 F (36.8 C) (!) 97.5 F (36.4 C)   TempSrc:   Oral Oral  SpO2: 100% 99% 100% 100%  Weight:   55.4 kg   Height:   5\' 5"  (1.651 m)    No intake or output data in the 24 hours ending 02/06/19 1046 Filed Weights   02/05/19 1600 02/05/19 2107  Weight: 54.9 kg 55.4 kg    Examination:  General exam: Appears calm and comfortable no distress Respiratory system: Clear to auscultation.. Cardiovascular system: S1 & S2 heard, RRR.  Gastrointestinal system: Abdomen is nondistended, soft and nontender.Normal bowel sounds heard. Central nervous system: Alert and oriented. No focal neurological deficits. Extremities: Left hip externally rotated and shortened Skin: No rashes, lesions or ulcers Psychiatry: Judgement and insight appear normal. Mood & affect appropriate.     Data Reviewed:   CBC: Recent Labs  Lab 02/05/19 1750 02/06/19 0218  WBC 7.6 4.9  NEUTROABS 6.2  --   HGB 12.1 11.4*  HCT 37.4 35.5*  MCV 97.4 98.9  PLT 206 169   Basic Metabolic Panel: Recent Labs  Lab 02/05/19 1750 02/06/19 0218  NA 138 137  K 4.3 4.2  CL 95* 95*  CO2 29 28  GLUCOSE 108* 106*  BUN 47* 54*  CREATININE 6.72* 7.11*  CALCIUM 9.5 9.3  PHOS  --  5.2*   GFR: Estimated Creatinine Clearance: 7.2 mL/min (A) (by C-G formula based on SCr of 7.11 mg/dL (H)). Liver Function Tests: Recent Labs  Lab 02/05/19 1750 02/06/19 0218  AST 14*  --   ALT 12  --   ALKPHOS 196*  --   BILITOT 0.7  --   PROT 6.6  --  ALBUMIN 3.3* 3.1*   No results for input(s): LIPASE, AMYLASE in the last 168 hours. No results for input(s): AMMONIA in the last 168 hours. Coagulation Profile: No results for input(s): INR, PROTIME in the last 168 hours. Cardiac Enzymes: No results for input(s): CKTOTAL, CKMB, CKMBINDEX, TROPONINI in the last 168 hours. BNP (last 3 results) No results for input(s): PROBNP in the last 8760 hours. HbA1C: No results for input(s): HGBA1C in the last 72 hours. CBG: No results for input(s): GLUCAP in the last 168 hours.  Lipid Profile: No results for input(s): CHOL, HDL, LDLCALC, TRIG, CHOLHDL, LDLDIRECT in the last 72 hours. Thyroid Function Tests: No results for input(s): TSH, T4TOTAL, FREET4, T3FREE, THYROIDAB in the last 72 hours. Anemia Panel: No results for input(s): VITAMINB12, FOLATE, FERRITIN, TIBC, IRON, RETICCTPCT in the last 72 hours. Urine analysis:    Component Value Date/Time   COLORURINE RED BIOCHEMICALS MAY BE AFFECTED BY COLOR (A) 01/03/2011 2311   APPEARANCEUR TURBID (A) 01/03/2011 2311   LABSPEC 1.013 01/03/2011 2311   PHURINE 6.0 01/03/2011 2311   GLUCOSEU 100 (A) 01/03/2011 2311   HGBUR LARGE (A) 01/03/2011 2311   BILIRUBINUR MODERATE (A) 01/03/2011 2311   KETONESUR 15 (A) 01/03/2011 2311   PROTEINUR >300 (A) 01/03/2011 2311   UROBILINOGEN 0.2 01/03/2011 2311   NITRITE POSITIVE (A) 01/03/2011 2311   LEUKOCYTESUR LARGE (A) 01/03/2011 2311   Sepsis Labs: @LABRCNTIP (procalcitonin:4,lacticidven:4)  ) Recent Results (from the past 240 hour(s))  SARS Coronavirus 2 (CEPHEID - Performed in [place] hospital lab), Hosp Order     Status: None   Collection Time: 02/05/19  7:12 PM  Result Value Ref Range Status   SARS Coronavirus 2 NEGATIVE NEGATIVE Final    Comment: (NOTE) If result is NEGATIVE SARS-CoV-2 target nucleic acids are NOT DETECTED. The SARS-CoV-2 RNA is generally detectable in upper and lower  respiratory specimens during the acute phase of infection. The lowest  concentration of SARS-CoV-2 viral copies this assay can detect is 250  copies / mL. A negative result does not preclude SARS-CoV-2 infection  and should not be used as the sole basis for treatment or other  patient management decisions.  A negative result may occur with  improper specimen collection / handling, submission of specimen other  than nasopharyngeal swab, presence of viral mutation(s) within the  areas targeted by this assay, and inadequate number of viral copies  (<250 copies / mL). A negative  result must be combined with clinical  observations, patient history, and epidemiological information. If result is POSITIVE SARS-CoV-2 target nucleic acids are DETECTED. The SARS-CoV-2 RNA is generally detectable in upper and lower  respiratory specimens dur ing the acute phase of infection.  Positive  results are indicative of active infection with SARS-CoV-2.  Clinical  correlation with patient history and other diagnostic information is  necessary to determine patient infection status.  Positive results do  not rule out bacterial infection or co-infection with other viruses. If result is PRESUMPTIVE POSTIVE SARS-CoV-2 nucleic acids MAY BE PRESENT.   A presumptive positive result was obtained on the submitted specimen  and confirmed on repeat testing.  While 2019 novel coronavirus  (SARS-CoV-2) nucleic acids may be present in the submitted sample  additional confirmatory testing may be necessary for epidemiological  and / or clinical management purposes  to differentiate between  SARS-CoV-2 and other Sarbecovirus currently known to infect humans.  If clinically indicated additional testing with an alternate test  methodology [phone]) is advised. The SARS-CoV-2 RNA  is generally  detectable in upper and lower respiratory sp ecimens during the acute  phase of infection. The expected result is Negative. Fact Sheet for Patients:  StrictlyIdeas.no Fact Sheet for Healthcare Providers: BankingDealers.co.za This test is not yet approved or cleared by the [place] FDA and has been authorized for detection and/or diagnosis of SARS-CoV-2 by FDA under an Emergency Use Authorization (EUA).  This EUA will remain in effect (meaning this test can be used) for the duration of the COVID-19 declaration under Section 564(b)(1) of the Act, 21 U.S.C. section 360bbb-3(b)(1), unless the authorization is terminated or revoked sooner. Performed at [facility], [place] [address], [place], [place] [address]   MRSA PCR Screening     Status: None   Collection Time: 02/06/19  1:08 AM  Result Value Ref Range Status   MRSA by PCR NEGATIVE NEGATIVE Final    Comment:        The GeneXpert MRSA Assay (FDA approved for NASAL specimens only), is one component of a comprehensive MRSA colonization surveillance program. It is not intended to diagnose MRSA infection nor to guide or monitor treatment for MRSA infections. Performed at [facility], [place] [address], [place], [place] [address]          Radiology Studies: Ct Hip Left Wo Contrast  Result Date: 02/05/2019 CLINICAL DATA:  Left hip pain after a fall out of bed. Initial encounter. EXAM: CT OF THE LEFT HIP WITHOUT CONTRAST TECHNIQUE: Multidetector CT imaging of the left hip was performed according to the standard protocol. Multiplanar CT image reconstructions were also generated. COMPARISON:  Plain films left hip earlier today. FINDINGS: Bones/Joint/Cartilage As seen on the comparison examination, the patient has an acute subcapital fracture of the left hip. The superior aspect of the fracture is impacted 1.3 cm. There is minimal anterior displacement femoral neck. No other is seen. No lytic or sclerotic lesion. Ligaments Suboptimally assessed by CT. Muscles and Tendons Intact. Soft tissues Contrast in the colon and diverticulosis noted. IMPRESSION: Acute subcapital fracture left hip. Electronically Signed   By: Inge Rise M.D.   On: 02/05/2019 19:59   Dg Hip Unilat W Or Wo Pelvis 2-3 Views Left  Result Date: 02/05/2019 CLINICAL DATA:  Left hip pain due to a fall today. EXAM: DG HIP (WITH OR WITHOUT PELVIS) 2-3V LEFT COMPARISON:  None. FINDINGS: The patient has an acute subcapital fracture of the left hip. No other acute bony or joint abnormality. Contrast material in the colon is noted. Diverticulosis scratch the diverticular disease is seen. IMPRESSION: Acute subcapital  fracture left hip. Electronically Signed   By: Inge Rise M.D.   On: 02/05/2019 17:56        Scheduled Meds: . amLODipine  5 mg Oral QHS  . calcitRIOL  0.25 mcg Oral Q T,Th,Sa-HD  . chlorhexidine  60 mL Topical Once  . Chlorhexidine Gluconate Cloth  6 each Topical Q0600  . cinacalcet  30 mg Oral Q supper  . fluticasone furoate-vilanterol  1 puff Inhalation QPM  . lanthanum  1,000 mg Oral TID WC  . [START ON 02/09/2019] levothyroxine  75 mcg Oral Once per day on Tue Thu   And  . levothyroxine  50 mcg Oral Once per day on Sun Mon Wed Fri Sat  . povidone-iodine  2 application Topical Once  . umeclidinium bromide  1 puff Inhalation Daily   Continuous Infusions: . sodium chloride    . sodium chloride    .  ceFAZolin (ANCEF) IV    . tranexamic acid       LOS: 1 day    Time spent: 15min    Domenic Polite, MD [facility]   02/06/2019, 10:46 AM

## 2019-02-06 NOTE — Progress Notes (Signed)
Orthopedic Tech Progress Note Patient Details:  Kimberly [name] 25-Oct-1955 [id number]  Ortho Devices Ortho Device/Splint Location: Trapeze bar Ortho Device/Splint Interventions: Application   Post Interventions Patient Tolerated: Well Instructions Provided: Care of device   [place] Pink 02/06/2019, 1:04 PM

## 2019-02-06 NOTE — Op Note (Signed)
OPERATIVE REPORT  SURGEON: Rod Can, MD   ASSISTANT: Daralene Milch [place], [place].  PREOPERATIVE DIAGNOSIS: Displaced Left femoral neck fracture.   POSTOPERATIVE DIAGNOSIS: Displaced Left femoral neck fracture.   PROCEDURE: Left total hip arthroplasty, anterior approach.   IMPLANTS: DePuy Tri Lock stem, size 7, hi offset. DePuy Pinnacle Cup, size 50 mm. DePuy Altrx liner, size 32 by 50 mm, +4 neutral. DePuy Biolox ceramic head ball, size 32 + 1 mm.  ANESTHESIA:  Spinal  ESTIMATED BLOOD LOSS:-350 mL    ANTIBIOTICS: 2 g Ancef.  DRAINS: None.  COMPLICATIONS: None.   CONDITION: PACU - hemodynamically stable.   BRIEF CLINICAL NOTE: Kimberly [name] is a 63 y.o. female with a displaced Left femoral neck fracture. The patient was admitted to the hospitalist service and underwent perioperative risk stratification and medical optimization. The risks, benefits, and alternatives to hemiarthroplasty were explained, and the patient elected to proceed.  PROCEDURE IN DETAIL: The patient was taken to the operating room and spinal anesthesia was obtained on the hospital bed. The patient was then positioned on the Hana table. All bony prominences were well padded. The hip was prepped and draped in the normal sterile surgical fashion. A time-out was called verifying side and site of surgery. Antibiotics were given within 60 minutes of beginning the procedure.  The direct anterior approach to the hip was performed through the Hueter interval. Lateral femoral circumflex vessels were treated with the Auqumantys. The anterior capsule was exposed and an inverted T capsulotomy was made. Fracture hematoma was encountered and evacuated. The patient was found to have a comminuted Left subcapital femoral neck fracture. I freshened the femoral neck cut with a saw. I removed the femoral neck fragment. A corkscrew was placed into the head and the head was removed. This was passed to the back  table and was measured.  Acetabular exposure was achieved, and the pulvinar and labrum were excised. Sequential reaming of the acetabulum was then performed up to a size 49 mm reamer. A 50 mm cup was then opened and impacted into place at approximately 40 degrees of abduction and 20 degrees of anteversion. The final polyethylene liner was impacted into place and acetabular osteophytes were removed.   I then gained femoral exposure taking care to protect the abductors and greater trochanter. This was performed using standard external rotation, extension, and adduction. The capsule was peeled off the inner aspect of the greater trochanter, taking care to preserve the short external rotators. A cookie cutter was used to enter the femoral canal, and then the femoral canal finder was placed. Sequential broaching was performed up to a size 7. Calcar planer was used on the femoral neck remnant. I placed a hi offset neck and a trial head ball. The hip was reduced. Leg lengths and offset were checked fluoroscopically. The hip was dislocated and trial components were removed. The final implants were placed, and the hip was reduced.  Fluoroscopy was used to confirm component position and leg lengths. At 90 degrees of external rotation and full extension, the hip was stable to an anterior directed force.  The wound was copiously irrigated with normal saline using pulse lavage. Marcaine solution was injected into the periarticular soft tissue. The wound was closed in layers using #1 Vicryl and V-Loc for the fascia, 2-0 Vicryl for the subcutaneous fat, 2-0 Monocryl for the deep dermal layer, and staples plus Dermabond for the skin. Once the glue was fully dried, an Aquacell Ag dressing was applied. The patient  was transported to the recovery room in stable condition. Sponge, needle, and instrument counts were correct at the end of the case x2. The patient tolerated the procedure well and there were no  known complications.  Please note that a surgical assistant was a medical necessity for this procedure to perform it in a safe and expeditious manner. Assistant was necessary to provide appropriate retraction of vital neurovascular structures, to prevent femoral fracture, and to allow for anatomic placement of the prosthesis.

## 2019-02-06 NOTE — Anesthesia Postprocedure Evaluation (Signed)
Anesthesia Post Note  Patient: Kimberly [name]  Procedure(s) Performed: TOTAL HIP ARTHROPLASTY ANTERIOR APPROACH (Left Hip)     Patient location during evaluation: PACU Anesthesia Type: MAC Level of consciousness: oriented and awake and alert Pain management: pain level controlled Vital Signs Assessment: post-procedure vital signs reviewed and stable Respiratory status: spontaneous breathing, respiratory function stable and patient connected to nasal cannula oxygen Cardiovascular status: blood pressure returned to baseline and stable Postop Assessment: no headache, no backache and no apparent nausea or vomiting Anesthetic complications: no    Last Vitals:  Vitals:   02/06/19 2000 02/06/19 2030  BP: (!) 85/52 (!) 81/48  Pulse: (!) 48 62  Resp:    Temp:    SpO2:      Last Pain:  Vitals:   02/06/19 1927  TempSrc: Oral  PainSc: 0-No pain                 Noelene Gang COKER

## 2019-02-06 NOTE — Anesthesia Preprocedure Evaluation (Signed)
Anesthesia Evaluation  Patient identified by MRN, date of birth, ID band Patient awake    Reviewed: Allergy & Precautions, NPO status , Patient's Chart, lab work & pertinent test results  Airway Mallampati: II  TM Distance: >3 FB Neck ROM: Full    Dental  (+) Teeth Intact, Dental Advisory Given   Pulmonary    breath sounds clear to auscultation       Cardiovascular hypertension,  Rhythm:Regular Rate:Normal     Neuro/Psych    GI/Hepatic   Endo/Other  diabetes  Renal/GU      Musculoskeletal   Abdominal   Peds  Hematology   Anesthesia Other Findings   Reproductive/Obstetrics                             Anesthesia Physical Anesthesia Plan  ASA: III  Anesthesia Plan: Spinal   Post-op Pain Management:    Induction: Intravenous  PONV Risk Score and Plan: Ondansetron, Dexamethasone and Propofol infusion  Airway Management Planned: Simple Face Mask and Natural Airway  Additional Equipment:   Intra-op Plan:   Post-operative Plan:   Informed Consent: I have reviewed the patients History and Physical, chart, labs and discussed the procedure including the risks, benefits and alternatives for the proposed anesthesia with the patient or authorized representative who has indicated his/her understanding and acceptance.     Dental advisory given  Plan Discussed with: CRNA and Anesthesiologist  Anesthesia Plan Comments:         Anesthesia Quick Evaluation

## 2019-02-06 NOTE — Transfer of Care (Signed)
Immediate Anesthesia Transfer of Care Note  Patient: Kimberly [name]  Procedure(s) Performed: TOTAL HIP ARTHROPLASTY ANTERIOR APPROACH (Left Hip)  Patient Location: PACU  Anesthesia Type:MAC and Spinal  Level of Consciousness: awake, alert  and oriented  Airway & Oxygen Therapy: Patient Spontanous Breathing  Post-op Assessment: Report given to RN and Post -op Vital signs reviewed and stable  Post vital signs: Reviewed and stable  Last Vitals:  Vitals Value Taken Time  BP    Temp    Pulse 66 02/06/2019  5:09 PM  Resp 16 02/06/2019  5:09 PM  SpO2 99 % 02/06/2019  5:09 PM  Vitals shown include unvalidated device data.  Last Pain:  Vitals:   02/06/19 1328  TempSrc: Oral  PainSc:       Patients Stated Pain Goal: 1 (68/03/21 2248)  Complications: No apparent anesthesia complications

## 2019-02-06 NOTE — Discharge Instructions (Signed)
°Dr. Kem Hensen °Joint Replacement Specialist °[facility] °[address] °[place], [place] [address] °[phone] ° ° °TOTAL HIP REPLACEMENT POSTOPERATIVE DIRECTIONS ° ° ° °Hip Rehabilitation, Guidelines Following Surgery  ° °WEIGHT BEARING °Weight bearing as tolerated with assist device (walker, cane, etc) as directed, use it as long as suggested by your surgeon or therapist, typically at least 4-6 weeks. ° °The results of a hip operation are greatly improved after range of motion and muscle strengthening exercises. Follow all safety measures which are given to protect your hip. If any of these exercises cause increased pain or swelling in your joint, decrease the amount until you are comfortable again. Then slowly increase the exercises. Call your caregiver if you have problems or questions.  ° °HOME CARE INSTRUCTIONS  °Most of the following instructions are designed to prevent the dislocation of your new hip.  °Remove items at home which could result in a fall. This includes throw rugs or furniture in walking pathways.  °Continue medications as instructed at time of discharge. °· You may have some home medications which will be placed on hold until you complete the course of blood thinner medication. °· You may start showering once you are discharged home. Do not remove your dressing. °Do not put on socks or shoes without following the instructions of your caregivers.   °Sit on chairs with arms. Use the chair arms to help push yourself up when arising.  °Arrange for the use of a toilet seat elevator so you are not sitting low.  °· Walk with walker as instructed.  °You may resume a sexual relationship in one month or when given the OK by your caregiver.  °Use walker as long as suggested by your caregivers.  °You may put full weight on your legs and walk as much as is comfortable. °Avoid periods of inactivity such as sitting longer than an hour when not asleep. This helps prevent  blood clots.  °You may return to work once you are cleared by your surgeon.  °Do not drive a car for 6 weeks or until released by your surgeon.  °Do not drive while taking narcotics.  °Wear elastic stockings for two weeks following surgery during the day but you may remove then at night.  °Make sure you keep all of your appointments after your operation with all of your doctors and caregivers. You should call the office at the above phone number and make an appointment for approximately two weeks after the date of your surgery. °Please pick up a stool softener and laxative for home use as long as you are requiring pain medications. °· ICE to the affected hip every three hours for 30 minutes at a time and then as needed for pain and swelling. Continue to use ice on the hip for pain and swelling from surgery. You may notice swelling that will progress down to the foot and ankle.  This is normal after surgery.  Elevate the leg when you are not up walking on it.   °It is important for you to complete the blood thinner medication as prescribed by your doctor. °· Continue to use the breathing machine which will help keep your temperature down.  It is common for your temperature to cycle up and down following surgery, especially at night when you are not up moving around and exerting yourself.  The breathing machine keeps your lungs expanded and your temperature down. ° °RANGE OF MOTION AND STRENGTHENING EXERCISES  °These exercises are   designed to help you keep full movement of your hip joint. Follow your caregiver's or physical therapist's instructions. Perform all exercises about fifteen times, three times per day or as directed. Exercise both hips, even if you have had only one joint replacement. These exercises can be done on a training (exercise) mat, on the floor, on a table or on a bed. Use whatever works the best and is most comfortable for you. Use music or television while you are exercising so that the exercises  are a pleasant break in your day. This will make your life better with the exercises acting as a break in routine you can look forward to.  °Lying on your back, slowly slide your foot toward your buttocks, raising your knee up off the floor. Then slowly slide your foot back down until your leg is straight again.  °Lying on your back spread your legs as far apart as you can without causing discomfort.  °Lying on your side, raise your upper leg and foot straight up from the floor as far as is comfortable. Slowly lower the leg and repeat.  °Lying on your back, tighten up the muscle in the front of your thigh (quadriceps muscles). You can do this by keeping your leg straight and trying to raise your heel off the floor. This helps strengthen the largest muscle supporting your knee.  °Lying on your back, tighten up the muscles of your buttocks both with the legs straight and with the knee bent at a comfortable angle while keeping your heel on the floor.  ° °SKILLED REHAB INSTRUCTIONS: °If the patient is transferred to a skilled rehab facility following release from the hospital, a list of the current medications will be sent to the facility for the patient to continue.  When discharged from the skilled rehab facility, please have the facility set up the patient's Home Health Physical Therapy prior to being released. Also, the skilled facility will be responsible for providing the patient with their medications at time of release from the facility to include their pain medication and their blood thinner medication. If the patient is still at the rehab facility at time of the two week follow up appointment, the skilled rehab facility will also need to assist the patient in arranging follow up appointment in our office and any transportation needs. ° °MAKE SURE YOU:  °Understand these instructions.  °Will watch your condition.  °Will get help right away if you are not doing well or get worse. ° °Pick up stool softner and  laxative for home use following surgery while on pain medications. °Do not remove your dressing. °The dressing is waterproof--it is OK to take showers. °Continue to use ice for pain and swelling after surgery. °Do not use any lotions or creams on the incision until instructed by your surgeon. °Total Hip Protocol. ° ° °

## 2019-02-06 NOTE — Anesthesia Procedure Notes (Signed)
Spinal  Patient location during procedure: OR Start time: 02/06/2019 2:45 PM End time: 02/06/2019 3:55 PM Staffing Anesthesiologist: Roberts Gaudy, MD Performed: anesthesiologist  Preanesthetic Checklist Completed: patient identified, site marked, surgical consent, pre-op evaluation, timeout performed, IV checked, risks and benefits discussed and monitors and equipment checked Spinal Block Patient position: left lateral decubitus Prep: ChloraPrep Patient monitoring: heart rate, cardiac monitor, continuous pulse ox and blood pressure Approach: midline Location: L3-4 Injection technique: single-shot Needle Needle type: Whitacre  Needle gauge: 24 G Needle length: 9 cm Assessment Sensory level: T6 Additional Notes 12 mg 0.75% Bupivacaine injected easily

## 2019-02-07 LAB — BASIC METABOLIC PANEL
Anion gap: 7 (ref 5–15)
BUN: 14 mg/dL (ref 8–23)
CO2: 28 mmol/L (ref 22–32)
Calcium: 8.4 mg/dL — ABNORMAL LOW (ref 8.9–10.3)
Chloride: 99 mmol/L (ref 98–111)
Creatinine, Ser: 3.24 mg/dL — ABNORMAL HIGH (ref 0.44–1.00)
GFR calc Af Amer: 17 mL/min — ABNORMAL LOW (ref >60)
GFR calc non Af Amer: 15 mL/min — ABNORMAL LOW (ref >60)
Glucose, Bld: 89 mg/dL (ref 70–99)
Potassium: 3.8 mmol/L (ref 3.5–5.1)
Sodium: 134 mmol/L — ABNORMAL LOW (ref 135–145)

## 2019-02-07 LAB — HEPATITIS B SURFACE ANTIGEN: Hepatitis B Surface Ag: NEGATIVE

## 2019-02-07 MED ORDER — ENOXAPARIN SODIUM 40 MG/0.4ML ~~LOC~~ SOLN: 40.0000 mg | SUBCUTANEOUS | Status: DC

## 2019-02-07 MED ORDER — ENOXAPARIN SODIUM 30 MG/0.3ML ~~LOC~~ SOLN
30.0000 mg | SUBCUTANEOUS | Status: DC
  Administered 2019-02-07 – 2019-02-09 (×3): 30 mg via SUBCUTANEOUS
  Filled 2019-02-07 (×3): qty 0.3

## 2019-02-07 MED ORDER — ASPIRIN 81 MG PO CHEW
81.0000 mg | CHEWABLE_TABLET | Freq: Two times a day (BID) | ORAL | Status: AC
  Administered 2019-02-07: 18:00:00 81 mg via ORAL
  Filled 2019-02-07: qty 1

## 2019-02-07 MED ORDER — HYDROCODONE-ACETAMINOPHEN 5-325 MG PO TABS
ORAL_TABLET | ORAL | Status: AC
  Administered 2019-02-07: 1 via ORAL
  Filled 2019-02-07: qty 2

## 2019-02-07 MED ORDER — ASPIRIN 325 MG PO TABS
325.0000 mg | ORAL_TABLET | Freq: Every day | ORAL | Status: DC
  Administered 2019-02-08 – 2019-02-09 (×2): 325 mg via ORAL
  Filled 2019-02-07 (×2): qty 1

## 2019-02-07 NOTE — Progress Notes (Signed)
Consult received. Will need formal PT/OT to assist in discharge planning. Once recommendations made, CSW can assist with dispostion.

## 2019-02-07 NOTE — Progress Notes (Signed)
    Subjective: 1 Day Post-Op Procedure(s) (LRB): TOTAL HIP ARTHROPLASTY ANTERIOR APPROACH (Left) Patient reports pain as 3 on 0-10 scale.   Denies CP or SOB.  Voiding without difficulty. Positive flatus. Objective: Vital signs in last 24 hours: Temp:  [98.2 F (36.8 C)-99.7 F (37.6 C)] 99.7 F (37.6 C) (05/17 0658) Pulse Rate:  [48-78] 75 (05/17 0401) Resp:  [9-20] 16 (05/16 1745) BP: (80-174)/(38-89) 95/38 (05/17 0401) SpO2:  [91 %-100 %] 92 % (05/17 0713) Weight:  [56 kg] 56 kg (05/16 2353)  Intake/Output from previous day: 05/16 0701 - 05/17 0700 In: 800 [I.V.:800] Out: -255 [Blood:350] Intake/Output this shift: No intake/output data recorded.  Labs: Recent Labs    02/05/19 1750 02/06/19 0218  HGB 12.1 11.4*   Recent Labs    02/05/19 1750 02/06/19 0218  WBC 7.6 4.9  RBC 3.84* 3.59*  HCT 37.4 35.5*  PLT 206 209   Recent Labs    02/06/19 0218 02/07/19 0340  NA 137 134*  K 4.2 3.8  CL 95* 99  CO2 28 28  BUN 54* 14  CREATININE 7.11* 3.24*  GLUCOSE 106* 89  CALCIUM 9.3 8.4*   No results for input(s): LABPT, INR in the last 72 hours.  Physical Exam: Neurologically intact ABD soft Intact pulses distally Incision: dressing C/D/I and no drainage Compartment soft Body mass index is 20.54 kg/m.   Assessment/Plan: 1 Day Post-Op Procedure(s) (LRB): TOTAL HIP ARTHROPLASTY ANTERIOR APPROACH (Left) Advance diet Up with therapy  Dahlia Bailiff for Dr. Melina Schools [facility] [phone] 02/07/2019, 10:50 AM

## 2019-02-07 NOTE — Progress Notes (Signed)
Subjective:  S/p surgery then HD overnight- ran even- ended up positive   Objective Vital signs in last 24 hours: Vitals:   02/07/19 0030 02/07/19 0401 02/07/19 0658 02/07/19 0713  BP: (!) 102/58 (!) 95/38    Pulse: 73 75    Resp:      Temp: 99.5 F (37.5 C) 98.2 F (36.8 C) 99.7 F (37.6 C)   TempSrc: Oral Oral    SpO2: 100% 91%  92%  Weight:      Height:       Weight change: 1.115 kg  Intake/Output Summary (Last 24 hours) at 02/07/2019 0935 Last data filed at 02/07/2019 0600 Gross per 24 hour  Intake 800 ml  Output -255 ml  Net 1055 ml    Dialyzes at [facility]- TTS 4 hours  EDW 56. HD Bath 2/2/, Dialyzer 180, Heparin yes- 6000 per tx. Access left AVG.  . Gets epo 5000  Tiw,calcitriol 0.25 tiw.  sensipar 30 daily and fosrenol for binding.  Last labs as OP hgb 10.9, phos 4.0 and pth 526   Assessment/Plan: 63 year old WF- ESRD s/p failed transplant- presents after mechanical fall and hip fracture  1 Hip fracture-   operative repair with THA on  5/16-  pain control 2 ESRD: normally TTS via AVG-  HD overnight  -  Next will be on Tuesday  3 Hypertension: pt tends to get orthostatic as OP- on no BP meds- EDW seems appropriate as OP as well- UF to goal - ran positive last night BP still lowish 4. Anemia of ESRD: actually gets epogen- I think because of her concerns with other ESA's-  hgb above goal right now, will hold ESA and follow 5. Metabolic Bone Disease: cont her home calcitriol, fosrenol and sensipar    Kimberly [name]    Labs: Basic Metabolic Panel: Recent Labs  Lab 02/05/19 1750 02/06/19 0218 02/07/19 0340  NA 138 137 134*  K 4.3 4.2 3.8  CL 95* 95* 99  CO2 29 28 28   GLUCOSE 108* 106* 89  BUN 47* 54* 14  CREATININE 6.72* 7.11* 3.24*  CALCIUM 9.5 9.3 8.4*  PHOS  --  5.2*  --    Liver Function Tests: Recent Labs  Lab 02/05/19 1750 02/06/19 0218  AST 14*  --   ALT 12  --   ALKPHOS 196*  --   BILITOT 0.7  --   PROT 6.6  --   ALBUMIN 3.3* 3.1*   No  results for input(s): LIPASE, AMYLASE in the last 168 hours. No results for input(s): AMMONIA in the last 168 hours. CBC: Recent Labs  Lab 02/05/19 1750 02/06/19 0218  WBC 7.6 4.9  NEUTROABS 6.2  --   HGB 12.1 11.4*  HCT 37.4 35.5*  MCV 97.4 98.9  PLT 206 209   Cardiac Enzymes: No results for input(s): CKTOTAL, CKMB, CKMBINDEX, TROPONINI in the last 168 hours. CBG: No results for input(s): GLUCAP in the last 168 hours.  Iron Studies: No results for input(s): IRON, TIBC, TRANSFERRIN, FERRITIN in the last 72 hours. Studies/Results: Ct Hip Left Wo Contrast  Result Date: 02/05/2019 CLINICAL DATA:  Left hip pain after a fall out of bed. Initial encounter. EXAM: CT OF THE LEFT HIP WITHOUT CONTRAST TECHNIQUE: Multidetector CT imaging of the left hip was performed according to the standard protocol. Multiplanar CT image reconstructions were also generated. COMPARISON:  Plain films left hip earlier today. FINDINGS: Bones/Joint/Cartilage As seen on the comparison examination, the patient has an acute  subcapital fracture of the left hip. The superior aspect of the fracture is impacted 1.3 cm. There is minimal anterior displacement femoral neck. No other is seen. No lytic or sclerotic lesion. Ligaments Suboptimally assessed by CT. Muscles and Tendons Intact. Soft tissues Contrast in the colon and diverticulosis noted. IMPRESSION: Acute subcapital fracture left hip. Electronically Signed   By: Inge Rise M.D.   On: 02/05/2019 19:59   Dg C-arm 1-60 Min  Result Date: 02/06/2019 CLINICAL DATA:  ANTERIOR approach LEFT total hip arthroplasty performed due to a subcapital LEFT femoral neck fracture. EXAM: OPERATIVE LEFT HIP (WITH PELVIS IF PERFORMED) 1 VIEW TECHNIQUE: Fluoroscopic spot image(s) were submitted for interpretation post-operatively. COMPARISON:  LEFT hip x-rays and CT of the LEFT hip performed yesterday. FINDINGS: Anatomic alignment of the LEFT hip prosthesis in the AP projection post LEFT  total hip arthroplasty. IMPRESSION: Anatomic alignment in the AP projection post LEFT total hip arthroplasty. Electronically Signed   By: Evangeline Dakin M.D.   On: 02/06/2019 20:16   Dg Hip Operative Unilat With Pelvis Left  Result Date: 02/06/2019 CLINICAL DATA:  ANTERIOR approach LEFT total hip arthroplasty performed due to a subcapital LEFT femoral neck fracture. EXAM: OPERATIVE LEFT HIP (WITH PELVIS IF PERFORMED) 1 VIEW TECHNIQUE: Fluoroscopic spot image(s) were submitted for interpretation post-operatively. COMPARISON:  LEFT hip x-rays and CT of the LEFT hip performed yesterday. FINDINGS: Anatomic alignment of the LEFT hip prosthesis in the AP projection post LEFT total hip arthroplasty. IMPRESSION: Anatomic alignment in the AP projection post LEFT total hip arthroplasty. Electronically Signed   By: Evangeline Dakin M.D.   On: 02/06/2019 20:16   Dg Hip Unilat W Or Wo Pelvis 2-3 Views Left  Result Date: 02/05/2019 CLINICAL DATA:  Left hip pain due to a fall today. EXAM: DG HIP (WITH OR WITHOUT PELVIS) 2-3V LEFT COMPARISON:  None. FINDINGS: The patient has an acute subcapital fracture of the left hip. No other acute bony or joint abnormality. Contrast material in the colon is noted. Diverticulosis scratch the diverticular disease is seen. IMPRESSION: Acute subcapital fracture left hip. Electronically Signed   By: Inge Rise M.D.   On: 02/05/2019 17:56   Medications: Infusions: . sodium chloride    . sodium chloride      Scheduled Medications: . aspirin  81 mg Oral BID WC  . [START ON 02/08/2019] aspirin  325 mg Oral Daily  . calcitRIOL  0.25 mcg Oral Q T,Th,Sa-HD  . Chlorhexidine Gluconate Cloth  6 each Topical Q0600  . cinacalcet  30 mg Oral Q supper  . fluticasone furoate-vilanterol  1 puff Inhalation QPM  . lanthanum  1,000 mg Oral TID WC  . [START ON 02/09/2019] levothyroxine  75 mcg Oral Once per day on Tue Thu   And  . levothyroxine  50 mcg Oral Once per day on Sun Mon Wed  Fri Sat  . senna  1 tablet Oral BID  . umeclidinium bromide  1 puff Inhalation Daily    have reviewed scheduled and prn medications.  Physical Exam: General: NAD, looks well  Heart: RRR Lungs: mostly clear to ant exam Abdomen: soft, non tender Extremities: no edema Dialysis Access: left AVF patent    02/07/2019,9:35 AM  LOS: 2 days

## 2019-02-07 NOTE — Progress Notes (Signed)
Physical Therapy Evaluation Patient Details Name: Kimberly [name] MRN: [id number] DOB: [birth year] Today's Date: 02/07/2019   History of Present Illness  63 year old female with history of ESRD on hemodialysis Tuesday Thursday Saturday, history of failed renal transplant, history of CVA, hypertension, hypothyroidism, asthma presented to the emergency room following a mechanical fall on 5/15. now s/p L THA  02/06/19    Clinical Impression  Pt admitted with above diagnosis. Pt currently with functional limitations due to the deficits listed below (see PT Problem List). PTA, pt home with husband and son in 2 story home, independent with mobility. Today, pt with post op pain and weakness, ambulating short distances. Pt left in chair to assess her ability to tolerate HD 4 hour sessions.  Pt will benefit from skilled PT to increase their independence and safety with mobility to allow discharge to the venue listed below.       Follow Up Recommendations Follow surgeon's recommendation for DC plan and follow-up therapies;Supervision/Assistance - 24 hour;Home health PT    Equipment Recommendations  Rolling walker with 5" wheels    Recommendations for Other Services       Precautions / Restrictions Precautions Precautions: Fall Restrictions Weight Bearing Restrictions: No      Mobility  Bed Mobility Overal bed mobility: Needs Assistance Bed Mobility: Supine to Sit     Supine to sit: Min assist     General bed mobility comments: min A to assist limb  Transfers Overall transfer level: Needs assistance Equipment used: Rolling walker (2 wheeled) Transfers: Sit to/from Stand Sit to Stand: Min assist         General transfer comment: min A to assist standing.   Ambulation/Gait Ambulation/Gait assistance: Min guard Gait Distance (Feet): 10 Feet Assistive device: Rolling walker (2 wheeled) Gait Pattern/deviations: Step-to pattern Gait velocity: decreased   General Gait Details:  cues for proper sequencing   Stairs            Wheelchair Mobility    Modified Rankin (Stroke Patients Only)       Balance Overall balance assessment: Needs assistance   Sitting balance-Leahy Scale: Good       Standing balance-Leahy Scale: Poor                               Pertinent Vitals/Pain Pain Assessment: Faces Pain Score: 6  Pain Location: L Hip Pain Descriptors / Indicators: Aching Pain Intervention(s): Limited activity within patient's tolerance;Monitored during session;Premedicated before session    Home Living Family/patient expects to be discharged to:: Private residence                      Prior Function                 Hand Dominance        Extremity/Trunk Assessment                Communication      Cognition Arousal/Alertness: Awake/alert Behavior During Therapy: WFL for tasks assessed/performed Overall Cognitive Status: Within Functional Limits for tasks assessed                                        General Comments      Exercises     Assessment/Plan    PT Assessment    PT Problem List  PT Treatment Interventions      PT Goals (Current goals can be found in the Care Plan section)       Frequency Min 5X/week   Barriers to discharge        Co-evaluation               AM-PAC PT "6 Clicks" Mobility  Outcome Measure Help needed turning from your back to your side while in a flat bed without using bedrails?: None Help needed moving from lying on your back to sitting on the side of a flat bed without using bedrails?: A Little Help needed moving to and from a bed to a chair (including a wheelchair)?: A Little Help needed standing up from a chair using your arms (e.g., wheelchair or bedside chair)?: A Little Help needed to walk in hospital room?: A Lot Help needed climbing 3-5 steps with a railing? : A Lot 6 Click Score: 17    End of Session Equipment  Utilized During Treatment: Gait belt Activity Tolerance: Patient tolerated treatment well Patient left: in bed Nurse Communication: Mobility status      Time: 4360-6770 PT Time Calculation (min) (ACUTE ONLY): 25 min   Charges:   PT Evaluation $PT Eval Low Complexity: 1 Low PT Treatments $Therapeutic Activity: 8-22 mins        Reinaldo Berber, PT, DPT Acute Rehabilitation Services Pager: [phone] Office: [place] 02/07/2019, 2:55 PM

## 2019-02-07 NOTE — Progress Notes (Signed)
PROGRESS NOTE    Kimberly [name]  VOH:[id number] DOB: 01-Apr-1956 DOA: 02/05/2019 PCP: [name], MD  Brief Narrative: 63 year old female with history of ESRD on hemodialysis Tuesday Thursday Saturday, history of failed renal transplant, history of CVA, hypertension, hypothyroidism, asthma presented to the emergency room following a mechanical fall on 5/15. -On further evaluation she was noted to have acute subcapital fracture of left hip orthopedics Dr. Doreatha Martin was consulted  Assessment & Plan:   Acute subcapital fracture left hip: -Following a mechanical fall  -Orthopedics consulted, underwent left hip total arthroplasty 5/16 -add lovenox for DVT proph -PT OT eval today -Will likely need short-term rehab  ESRD on TTS HD: -Nephrology following, underwent dialysis yesterday  Hx of CVA: History of cryptogenic stroke, embolic in nature.  Follows with neurology as an outpatient, had plans for TEE/ILR  -She is on aspirin 325 mg alone as she has wanted to avoid coumadin per prior documentation. -Resume aspirin  HTN: -BP soft, hold amlodipine today  Asthma: -Stable -Continue Brio Ellipta, Spiriva, and as needed albuterol  Hypothyroidism: -Continue home Synthroid  DVT prophylaxis: SCDs, add Lovenox Code Status: Full code, confirmed with patient Family Communication: None present on admission Disposition Plan:  Would likely need rehab postop  Consultants:   Renal  Orthopedics   Procedures:   Antimicrobials:    Subjective: -Okay, no events overnight  Objective: Vitals:   02/07/19 0401 02/07/19 0658 02/07/19 0713 02/07/19 1258  BP: (!) 95/38     Pulse: 75     Resp:      Temp: 98.2 F (36.8 C) 99.7 F (37.6 C)  (!) 100.6 F (38.1 C)  TempSrc: Oral   Oral  SpO2: 91%  92%   Weight:      Height:        Intake/Output Summary (Last 24 hours) at 02/07/2019 1422 Last data filed at 02/07/2019 0930 Gross per 24 hour  Intake 1020 ml  Output -255 ml  Net  1275 ml   Filed Weights   02/05/19 2107 02/06/19 1927 02/06/19 2353  Weight: 55.4 kg 56 kg 56 kg    Examination: Gen: Awake, Alert, Oriented X 3, no distress HEENT: PERRLA, Neck supple, no JVD Lungs: Decreased breath sounds at both bases CVS: RRR,No Gallops,Rubs or new Murmurs Abd: Soft, not distended, nontender, bowel sounds present Extremities: Left hip with dressing Psychiatry: Judgement and insight appear normal. Mood & affect appropriate.     Data Reviewed:   CBC: Recent Labs  Lab 02/05/19 1750 02/06/19 0218  WBC 7.6 4.9  NEUTROABS 6.2  --   HGB 12.1 11.4*  HCT 37.4 35.5*  MCV 97.4 98.9  PLT 206 694   Basic Metabolic Panel: Recent Labs  Lab 02/05/19 1750 02/06/19 0218 02/07/19 0340  NA 138 137 134*  K 4.3 4.2 3.8  CL 95* 95* 99  CO2 29 28 28   GLUCOSE 108* 106* 89  BUN 47* 54* 14  CREATININE 6.72* 7.11* 3.24*  CALCIUM 9.5 9.3 8.4*  PHOS  --  5.2*  --    GFR: Estimated Creatinine Clearance: 15.9 mL/min (A) (by C-G formula based on SCr of 3.24 mg/dL (H)). Liver Function Tests: Recent Labs  Lab 02/05/19 1750 02/06/19 0218  AST 14*  --   ALT 12  --   ALKPHOS 196*  --   BILITOT 0.7  --   PROT 6.6  --   ALBUMIN 3.3* 3.1*   No results for input(s): LIPASE, AMYLASE in the last 168 hours. No results  for input(s): AMMONIA in the last 168 hours. Coagulation Profile: No results for input(s): INR, PROTIME in the last 168 hours. Cardiac Enzymes: No results for input(s): CKTOTAL, CKMB, CKMBINDEX, TROPONINI in the last 168 hours. BNP (last 3 results) No results for input(s): PROBNP in the last 8760 hours. HbA1C: No results for input(s): HGBA1C in the last 72 hours. CBG: No results for input(s): GLUCAP in the last 168 hours. Lipid Profile: No results for input(s): CHOL, HDL, LDLCALC, TRIG, CHOLHDL, LDLDIRECT in the last 72 hours. Thyroid Function Tests: No results for input(s): TSH, T4TOTAL, FREET4, T3FREE, THYROIDAB in the last 72 hours. Anemia Panel:  No results for input(s): VITAMINB12, FOLATE, FERRITIN, TIBC, IRON, RETICCTPCT in the last 72 hours. Urine analysis:    Component Value Date/Time   COLORURINE RED BIOCHEMICALS MAY BE AFFECTED BY COLOR (A) 01/03/2011 2311   APPEARANCEUR TURBID (A) 01/03/2011 2311   LABSPEC 1.013 01/03/2011 2311   PHURINE 6.0 01/03/2011 2311   GLUCOSEU 100 (A) 01/03/2011 2311   HGBUR LARGE (A) 01/03/2011 2311   BILIRUBINUR MODERATE (A) 01/03/2011 2311   KETONESUR 15 (A) 01/03/2011 2311   PROTEINUR >300 (A) 01/03/2011 2311   UROBILINOGEN 0.2 01/03/2011 2311   NITRITE POSITIVE (A) 01/03/2011 2311   LEUKOCYTESUR LARGE (A) 01/03/2011 2311   Sepsis Labs: @LABRCNTIP (procalcitonin:4,lacticidven:4)  ) Recent Results (from the past 240 hour(s))  SARS Coronavirus 2 (CEPHEID - Performed in [place] hospital lab), Hosp Order     Status: None   Collection Time: 02/05/19  7:12 PM  Result Value Ref Range Status   SARS Coronavirus 2 NEGATIVE NEGATIVE Final    Comment: (NOTE) If result is NEGATIVE SARS-CoV-2 target nucleic acids are NOT DETECTED. The SARS-CoV-2 RNA is generally detectable in upper and lower  respiratory specimens during the acute phase of infection. The lowest  concentration of SARS-CoV-2 viral copies this assay can detect is 250  copies / mL. A negative result does not preclude SARS-CoV-2 infection  and should not be used as the sole basis for treatment or other  patient management decisions.  A negative result may occur with  improper specimen collection / handling, submission of specimen other  than nasopharyngeal swab, presence of viral mutation(s) within the  areas targeted by this assay, and inadequate number of viral copies  (<250 copies / mL). A negative result must be combined with clinical  observations, patient history, and epidemiological information. If result is POSITIVE SARS-CoV-2 target nucleic acids are DETECTED. The SARS-CoV-2 RNA is generally detectable in upper and lower   respiratory specimens dur ing the acute phase of infection.  Positive  results are indicative of active infection with SARS-CoV-2.  Clinical  correlation with patient history and other diagnostic information is  necessary to determine patient infection status.  Positive results do  not rule out bacterial infection or co-infection with other viruses. If result is PRESUMPTIVE POSTIVE SARS-CoV-2 nucleic acids MAY BE PRESENT.   A presumptive positive result was obtained on the submitted specimen  and confirmed on repeat testing.  While 2019 novel coronavirus  (SARS-CoV-2) nucleic acids may be present in the submitted sample  additional confirmatory testing may be necessary for epidemiological  and / or clinical management purposes  to differentiate between  SARS-CoV-2 and other Sarbecovirus currently known to infect humans.  If clinically indicated additional testing with an alternate test  methodology [phone]) is advised. The SARS-CoV-2 RNA is generally  detectable in upper and lower respiratory sp ecimens during the acute  phase of infection.  The expected result is Negative. Fact Sheet for Patients:  StrictlyIdeas.no Fact Sheet for Healthcare Providers: BankingDealers.co.za This test is not yet approved or cleared by the [place] FDA and has been authorized for detection and/or diagnosis of SARS-CoV-2 by FDA under an Emergency Use Authorization (EUA).  This EUA will remain in effect (meaning this test can be used) for the duration of the COVID-19 declaration under Section 564(b)(1) of the Act, 21 U.S.C. section 360bbb-3(b)(1), unless the authorization is terminated or revoked sooner. Performed at [facility], [place] [address], [place], [place] [address]   MRSA PCR Screening     Status: None   Collection Time: 02/06/19  1:08 AM  Result Value Ref Range Status   MRSA by PCR NEGATIVE NEGATIVE Final    Comment:        The  GeneXpert MRSA Assay (FDA approved for NASAL specimens only), is one component of a comprehensive MRSA colonization surveillance program. It is not intended to diagnose MRSA infection nor to guide or monitor treatment for MRSA infections. Performed at [facility], [place] [address], [place], [place] [address]          Radiology Studies: Ct Hip Left Wo Contrast  Result Date: 02/05/2019 CLINICAL DATA:  Left hip pain after a fall out of bed. Initial encounter. EXAM: CT OF THE LEFT HIP WITHOUT CONTRAST TECHNIQUE: Multidetector CT imaging of the left hip was performed according to the standard protocol. Multiplanar CT image reconstructions were also generated. COMPARISON:  Plain films left hip earlier today. FINDINGS: Bones/Joint/Cartilage As seen on the comparison examination, the patient has an acute subcapital fracture of the left hip. The superior aspect of the fracture is impacted 1.3 cm. There is minimal anterior displacement femoral neck. No other is seen. No lytic or sclerotic lesion. Ligaments Suboptimally assessed by CT. Muscles and Tendons Intact. Soft tissues Contrast in the colon and diverticulosis noted. IMPRESSION: Acute subcapital fracture left hip. Electronically Signed   By: Inge Rise M.D.   On: 02/05/2019 19:59   Dg C-arm 1-60 Min  Result Date: 02/06/2019 CLINICAL DATA:  ANTERIOR approach LEFT total hip arthroplasty performed due to a subcapital LEFT femoral neck fracture. EXAM: OPERATIVE LEFT HIP (WITH PELVIS IF PERFORMED) 1 VIEW TECHNIQUE: Fluoroscopic spot image(s) were submitted for interpretation post-operatively. COMPARISON:  LEFT hip x-rays and CT of the LEFT hip performed yesterday. FINDINGS: Anatomic alignment of the LEFT hip prosthesis in the AP projection post LEFT total hip arthroplasty. IMPRESSION: Anatomic alignment in the AP projection post LEFT total hip arthroplasty. Electronically Signed   By: Evangeline Dakin M.D.   On: 02/06/2019 20:16   Dg Hip  Operative Unilat With Pelvis Left  Result Date: 02/06/2019 CLINICAL DATA:  ANTERIOR approach LEFT total hip arthroplasty performed due to a subcapital LEFT femoral neck fracture. EXAM: OPERATIVE LEFT HIP (WITH PELVIS IF PERFORMED) 1 VIEW TECHNIQUE: Fluoroscopic spot image(s) were submitted for interpretation post-operatively. COMPARISON:  LEFT hip x-rays and CT of the LEFT hip performed yesterday. FINDINGS: Anatomic alignment of the LEFT hip prosthesis in the AP projection post LEFT total hip arthroplasty. IMPRESSION: Anatomic alignment in the AP projection post LEFT total hip arthroplasty. Electronically Signed   By: Evangeline Dakin M.D.   On: 02/06/2019 20:16   Dg Hip Unilat W Or Wo Pelvis 2-3 Views Left  Result Date: 02/05/2019 CLINICAL DATA:  Left hip pain due to a fall today. EXAM: DG HIP (WITH OR WITHOUT PELVIS) 2-3V LEFT COMPARISON:  None. FINDINGS: The  patient has an acute subcapital fracture of the left hip. No other acute bony or joint abnormality. Contrast material in the colon is noted. Diverticulosis scratch the diverticular disease is seen. IMPRESSION: Acute subcapital fracture left hip. Electronically Signed   By: Inge Rise M.D.   On: 02/05/2019 17:56        Scheduled Meds: . aspirin  81 mg Oral BID WC  . [START ON 02/08/2019] aspirin  325 mg Oral Daily  . calcitRIOL  0.25 mcg Oral Q T,Th,Sa-HD  . Chlorhexidine Gluconate Cloth  6 each Topical Q0600  . cinacalcet  30 mg Oral Q supper  . fluticasone furoate-vilanterol  1 puff Inhalation QPM  . lanthanum  1,000 mg Oral TID WC  . [START ON 02/09/2019] levothyroxine  75 mcg Oral Once per day on Tue Thu   And  . levothyroxine  50 mcg Oral Once per day on Sun Mon Wed Fri Sat  . senna  1 tablet Oral BID  . umeclidinium bromide  1 puff Inhalation Daily   Continuous Infusions: . sodium chloride    . sodium chloride       LOS: 2 days    Time spent: 5min    Domenic Polite, MD [facility]   02/07/2019, 2:22  PM

## 2019-02-08 ENCOUNTER — Encounter (HOSPITAL_COMMUNITY): Payer: Self-pay | Admitting: Orthopedic Surgery

## 2019-02-08 LAB — CBC
HCT: 25.2 % — ABNORMAL LOW (ref 36.0–46.0)
Hemoglobin: 8 g/dL — ABNORMAL LOW (ref 12.0–15.0)
MCH: 31.9 pg (ref 26.0–34.0)
MCHC: 31.7 g/dL (ref 30.0–36.0)
MCV: 100.4 fL — ABNORMAL HIGH (ref 80.0–100.0)
Platelets: 166 10*3/uL (ref 150–400)
RBC: 2.51 MIL/uL — ABNORMAL LOW (ref 3.87–5.11)
RDW: 14.2 % (ref 11.5–15.5)
WBC: 3.7 10*3/uL — ABNORMAL LOW (ref 4.0–10.5)
nRBC: 0 % (ref 0.0–0.2)

## 2019-02-08 LAB — BASIC METABOLIC PANEL
Anion gap: 12 (ref 5–15)
BUN: 30 mg/dL — ABNORMAL HIGH (ref 8–23)
CO2: 25 mmol/L (ref 22–32)
Calcium: 8.6 mg/dL — ABNORMAL LOW (ref 8.9–10.3)
Chloride: 96 mmol/L — ABNORMAL LOW (ref 98–111)
Creatinine, Ser: 5.39 mg/dL — ABNORMAL HIGH (ref 0.44–1.00)
GFR calc Af Amer: 9 mL/min — ABNORMAL LOW (ref >60)
GFR calc non Af Amer: 8 mL/min — ABNORMAL LOW (ref >60)
Glucose, Bld: 106 mg/dL — ABNORMAL HIGH (ref 70–99)
Potassium: 4.1 mmol/L (ref 3.5–5.1)
Sodium: 133 mmol/L — ABNORMAL LOW (ref 135–145)

## 2019-02-08 LAB — VITAMIN B12: Vitamin B-12: 158 pg/mL — ABNORMAL LOW (ref 180–914)

## 2019-02-08 LAB — HEPATITIS B CORE ANTIBODY, TOTAL: Hep B Core Total Ab: NEGATIVE

## 2019-02-08 LAB — IRON AND TIBC
Iron: 17 ug/dL — ABNORMAL LOW (ref 28–170)
Saturation Ratios: 13 % (ref 10.4–31.8)
TIBC: 129 ug/dL — ABNORMAL LOW (ref 250–450)
UIBC: 112 ug/dL

## 2019-02-08 LAB — FERRITIN: Ferritin: 833 ng/mL — ABNORMAL HIGH (ref 11–307)

## 2019-02-08 LAB — RETICULOCYTES
Immature Retic Fract: 8 % (ref 2.3–15.9)
RBC.: 2.5 MIL/uL — ABNORMAL LOW (ref 3.87–5.11)
Retic Count, Absolute: 33.5 10*3/uL (ref 19.0–186.0)
Retic Ct Pct: 1.3 % (ref 0.4–3.1)

## 2019-02-08 LAB — HEPATITIS B SURFACE ANTIBODY,QUALITATIVE: Hep B S Ab: NONREACTIVE

## 2019-02-08 LAB — FOLATE: Folate: 31.3 ng/mL (ref >5.9)

## 2019-02-08 MED ORDER — SODIUM CHLORIDE 0.9 % IV SOLN
510.0000 mg | Freq: Once | INTRAVENOUS | Status: AC
  Administered 2019-02-09: 510 mg via INTRAVENOUS
  Filled 2019-02-08 (×2): qty 17

## 2019-02-08 MED ORDER — POLYETHYLENE GLYCOL 3350 17 G PO PACK
17.0000 g | PACK | Freq: Every day | ORAL | Status: DC
  Administered 2019-02-08 – 2019-02-09 (×2): 17 g via ORAL
  Filled 2019-02-08 (×2): qty 1

## 2019-02-08 MED ORDER — PRO-STAT SUGAR FREE PO LIQD
30.0000 mL | Freq: Two times a day (BID) | ORAL | Status: DC
  Administered 2019-02-08 – 2019-02-09 (×3): 30 mL via ORAL
  Filled 2019-02-08 (×3): qty 30

## 2019-02-08 NOTE — Progress Notes (Signed)
Physical Therapy Treatment Patient Details Name: Kimberly [name] MRN: [id number] DOB: Apr 09, 1956 Today's Date: 02/08/2019    History of Present Illness 63 year old female with history of ESRD on hemodialysis Tuesday Thursday Saturday, history of failed renal transplant, history of CVA, hypertension, hypothyroidism, asthma presented to the emergency room following a mechanical fall on 5/15. now s/p L THA  02/06/19    PT Comments    Pt performed gait training and functional mobility during session.  She started session supine in bed and anxious too move.  Started pt off with supine LE exercises and she progressed well.  Pt requiring min assistance overall.  Plan for stair training tomorrow to prepare for return home.      Follow Up Recommendations  Follow surgeon's recommendation for DC plan and follow-up therapies;Supervision/Assistance - 24 hour;Home health PT     Equipment Recommendations  Rolling walker with 5" wheels    Recommendations for Other Services       Precautions / Restrictions Precautions Precautions: Fall Restrictions Weight Bearing Restrictions: Yes LLE Weight Bearing: Weight bearing as tolerated    Mobility  Bed Mobility Overal bed mobility: Needs Assistance Bed Mobility: Supine to Sit     Supine to sit: Min assist     General bed mobility comments: Min A to advance LLE to edge of bed and elevate trunk into sitting.  Pt is slow and guarded due to pain.    Transfers Overall transfer level: Needs assistance Equipment used: Rolling walker (2 wheeled) Transfers: Sit to/from Stand Sit to Stand: Min assist         General transfer comment: min A to assist standing.   Ambulation/Gait Ambulation/Gait assistance: Min guard Gait Distance (Feet): 80 Feet Assistive device: Rolling walker (2 wheeled) Gait Pattern/deviations: Step-to pattern;Trunk flexed;Antalgic Gait velocity: decreased   General Gait Details: Cues for sequencing and use of RW.  Pt  maintaining step to pattern for comfort.   Stairs             Wheelchair Mobility    Modified Rankin (Stroke Patients Only)       Balance Overall balance assessment: Needs assistance Sitting-balance support: Single extremity supported Sitting balance-Leahy Scale: Fair       Standing balance-Leahy Scale: Poor                              Cognition Arousal/Alertness: Awake/alert Behavior During Therapy: WFL for tasks assessed/performed Overall Cognitive Status: Within Functional Limits for tasks assessed                                        Exercises Total Joint Exercises Ankle Circles/Pumps: AROM;Both;10 reps;Supine Quad Sets: AROM;Left;10 reps;Supine Heel Slides: Left;10 reps;Supine;AAROM Hip ABduction/ADduction: Left;10 reps;Supine;AAROM    General Comments        Pertinent Vitals/Pain Pain Assessment: 0-10 Pain Score: 2  Pain Location: L Hip Pain Descriptors / Indicators: Aching Pain Intervention(s): Monitored during session;Repositioned;Patient requesting pain meds-RN notified(wanted pain meds pre tx so they would be working after PT.  )    Home Living                      Prior Function            PT Goals (current goals can now be found in the care plan section) Progress towards PT goals:  Progressing toward goals    Frequency    Min 5X/week      PT Plan Current plan remains appropriate    Co-evaluation              AM-PAC PT "6 Clicks" Mobility   Outcome Measure  Help needed turning from your back to your side while in a flat bed without using bedrails?: None Help needed moving from lying on your back to sitting on the side of a flat bed without using bedrails?: A Little Help needed moving to and from a bed to a chair (including a wheelchair)?: A Little Help needed standing up from a chair using your arms (e.g., wheelchair or bedside chair)?: A Little Help needed to walk in hospital  room?: A Little Help needed climbing 3-5 steps with a railing? : A Little 6 Click Score: 19    End of Session Equipment Utilized During Treatment: Gait belt Activity Tolerance: Patient tolerated treatment well Patient left: in chair;with call bell/phone within reach;with chair alarm set Nurse Communication: Mobility status       Time: 1450-1519 PT Time Calculation (min) (ACUTE ONLY): 29 min  Charges:  $Gait Training: 8-22 mins $Therapeutic Exercise: 8-22 mins                     [name], PTA Acute Rehabilitation Services Pager [phone] Office [place] 02/08/2019, 3:30 PM

## 2019-02-08 NOTE — TOC Initial Note (Addendum)
Transition of Care [facility]) - Initial/Assessment Note    Patient Details  Name: Kimberly [name] MRN: [id number] Date of Birth: June 28, 1956  Transition of [facility]) CM/SW Contact:    Marilu Favre, RN Phone Number: 02/08/2019, 12:40 PM  Clinical Narrative:                 Patient from home lives with husband and son. Confirmed face sheet information.  Requesting walker and 3 in 1  Provided medicare.gov list of home health agencies, patient requesting some more time to make a decision. Will follow up later  1412 Patient has decided on [facility] for home health, NCM has call in to [place] with [facility] to see if they can accept. Tommi Rumps with [facility] accepted referral.  Ordered 3 in 1 and walker asked MD to sign order, called Zack with [facility]  Expected Discharge Plan: [place] Barriers to Discharge: Continued Medical Work up   Patient Goals and CMS Choice Patient states their goals for this hospitalization and ongoing recovery are:: to go home  CMS Medicare.gov Compare Post Acute Care list provided to:: Patient Choice offered to / list presented to : Patient  Expected Discharge Plan and Services Expected Discharge Plan: [place]   Discharge Planning Services: CM Consult Post Acute Care Choice: Durable Medical Equipment, Home Health Living arrangements for the past 2 months: Single Family Home                 DME Arranged: 3-N-1, Walker rolling DME Agency: [employer]       HH Arranged: PT, OT          Prior Living Arrangements/Services Living arrangements for the past 2 months: Single Family Home Lives with:: Adult Children, Spouse Patient language and need for interpreter reviewed:: Yes        Need for Family Participation in Patient Care: Yes (Comment) Care giver support system in place?: Yes (comment)   Criminal Activity/Legal Involvement Pertinent to Current Situation/Hospitalization: No - Comment as needed  Activities of  Daily Living Home Assistive Devices/Equipment: Eyeglasses ADL Screening (condition at time of admission) Patient's cognitive ability adequate to safely complete daily activities?: Yes Is the patient deaf or have difficulty hearing?: No Does the patient have difficulty seeing, even when wearing glasses/contacts?: No Does the patient have difficulty concentrating, remembering, or making decisions?: No Patient able to express need for assistance with ADLs?: Yes Does the patient have difficulty dressing or bathing?: No Independently performs ADLs?: Yes (appropriate for developmental age) Does the patient have difficulty walking or climbing stairs?: No Weakness of Legs: Both Weakness of Arms/Hands: None  Permission Sought/Granted                  Emotional Assessment Appearance:: Appears stated age Attitude/Demeanor/Rapport: Engaged Affect (typically observed): Accepting Orientation: : Oriented to Self, Oriented to Place, Oriented to  Time, Oriented to Situation Alcohol / Substance Use: Not Applicable Psych Involvement: No (comment)  Admission diagnosis:  Fall, initial encounter [W19.XXXA] Closed subcapital fracture of femur, left, initial encounter [facility]) [S72.012A] Patient Active Problem List   Diagnosis Date Noted  . Displaced fracture of left femoral neck ([place]) 02/06/2019  . Closed left hip fracture, initial encounter ([place]) 02/05/2019  . History of stroke 02/05/2019  . IgA nephropathy with nephrotic syndrome 10/30/2017  . Snoring 10/30/2017  . Excessive daytime sleepiness 10/30/2017  . Complaint related to dreams 10/30/2017  . Acute embolic stroke ([place]) 07/20/2535  . Pre-transplant evaluation  for kidney transplant 05/08/2017  . Laryngopharyngeal reflux (LPR) 05/29/2016  . Cough, persistent 05/29/2016  . Chronic cough 09/15/2015  . Paroxysmal atrial fibrillation ([place]) 08/21/2015  . Acute pericarditis 08/05/2015  . Elevated troponin 08/05/2015  . Pain in the chest   .  Atypical chest pain 08/04/2015  . LUL cavitary lesion post VATS 2015-cx neg 09/01/2014  . Pulmonary infiltrates 07/05/2014  . Gross hematuria 06/30/2014  . Kidney transplant failure 06/28/2014  . Renal failure 06/28/2014  . Peritoneal dialysis catheter in place March 2015 01/14/2014  . Ventral hernia s/p lap repair w mesh March 2015 01/14/2014  . Secondary hyperparathyroidism (of renal origin) 04/20/2013  . ESRD (end stage renal disease) ([place]) 10/28/2012  . Diarrhea, unspecified 08/10/2012  . Urinary tract infection 08/10/2012  . Reactive airway disease 03/13/2012  . Pulmonary nodule 03/13/2012  . Stroke/cerebrovascular accident ([place]) 03/13/2012  . Vitamin D deficiency, unspecified 03/13/2012  . PVC's (premature ventricular contractions) 01/17/2012  . Stroke ([place]) 11/29/2011  . DM type 2 causing complication ([place]) 02/72/5366  . S/P kidney transplant   . IgA nephropathy   . GERD (gastroesophageal reflux disease) 12/07/2007  . Cough variant asthma vs uacs vs components of both  11/09/2007  . Hypothyroidism, unspecified 10/12/2007  . ANEMIA 10/12/2007  . HTN (hypertension) 10/12/2007  . CARDIAC ARRHYTHMIA 10/12/2007  . Asthma 10/12/2007  . CKD (chronic kidney disease) stage V requiring chronic dialysis ([place]) 10/12/2007   PCP:  [name], MD Pharmacy:   [id number] [address], [place] [place] [address] Phone: [phone] Fax: [place], [place] - [address] [place] [address] [place] [address] [place] [address] Phone: [phone] Fax: [phone]  [facility] [phone], [place] [facility] AT [facility] [place] [address] Phone: [phone] Fax: [phone]     Social Determinants of Health (SDOH) Interventions    Readmission Risk Interventions No flowsheet data  found.

## 2019-02-08 NOTE — Progress Notes (Addendum)
[facility] Progress Note   Subjective:  Seen in room - pain controlled overnight. No CP/dyspnea. Is concerned about intermittent low grade fevers - last was yesterday and < 100F, no cough/SOB. Anuric.  Objective Vitals:   02/07/19 1952 02/07/19 2035 02/08/19 0507 02/08/19 0817  BP:  101/60 103/60   Pulse:  74 68   Resp:  18 18   Temp: 98.8 F (37.1 C) 98.7 F (37.1 C) 98 F (36.7 C)   TempSrc: Oral Oral Oral   SpO2:  100% 93% 98%  Weight:      Height:       Physical Exam General: Well appearing, NAD. Afebrile. Heart: RRR; 3/6 systolic murmur Lungs: CTAB Abdomen: soft, non-tender Extremities: No LE edema Dialysis Access: LUE AVG + bruit  Additional Objective Labs: Basic Metabolic Panel: Recent Labs  Lab 02/06/19 0218 02/07/19 0340 02/08/19 0009  NA 137 134* 133*  K 4.2 3.8 4.1  CL 95* 99 96*  CO2 28 28 25   GLUCOSE 106* 89 106*  BUN 54* 14 30*  CREATININE 7.11* 3.24* 5.39*  CALCIUM 9.3 8.4* 8.6*  PHOS 5.2*  --   --    Liver Function Tests: Recent Labs  Lab 02/05/19 1750 02/06/19 0218  AST 14*  --   ALT 12  --   ALKPHOS 196*  --   BILITOT 0.7  --   PROT 6.6  --   ALBUMIN 3.3* 3.1*   CBC: Recent Labs  Lab 02/05/19 1750 02/06/19 0218 02/08/19 0009  WBC 7.6 4.9 3.7*  NEUTROABS 6.2  --   --   HGB 12.1 11.4* 8.0*  HCT 37.4 35.5* 25.2*  MCV 97.4 98.9 100.4*  PLT 206 209 166   Blood Culture    Component Value Date/Time   SDES  02/10/2018 0414    BLOOD LEFT FOREARM Performed at [facility], [place] [address], [place], [place] [address]    SPECREQUEST  02/10/2018 0414    BOTTLES DRAWN AEROBIC ONLY Blood Culture adequate volume Performed at [facility] [address], [place], [place] [address]    CULT  02/10/2018 0414    NO GROWTH 5 DAYS Performed at [facility], [place] [address], [place], [place] [address]    REPTSTATUS 02/15/2018 FINAL 02/10/2018 0414   Studies/Results: Dg C-arm  1-60 Min  Result Date: 02/06/2019 CLINICAL DATA:  ANTERIOR approach LEFT total hip arthroplasty performed due to a subcapital LEFT femoral neck fracture. EXAM: OPERATIVE LEFT HIP (WITH PELVIS IF PERFORMED) 1 VIEW TECHNIQUE: Fluoroscopic spot image(s) were submitted for interpretation post-operatively. COMPARISON:  LEFT hip x-rays and CT of the LEFT hip performed yesterday. FINDINGS: Anatomic alignment of the LEFT hip prosthesis in the AP projection post LEFT total hip arthroplasty. IMPRESSION: Anatomic alignment in the AP projection post LEFT total hip arthroplasty. Electronically Signed   By: Evangeline Dakin M.D.   On: 02/06/2019 20:16   Dg Hip Operative Unilat With Pelvis Left  Result Date: 02/06/2019 CLINICAL DATA:  ANTERIOR approach LEFT total hip arthroplasty performed due to a subcapital LEFT femoral neck fracture. EXAM: OPERATIVE LEFT HIP (WITH PELVIS IF PERFORMED) 1 VIEW TECHNIQUE: Fluoroscopic spot image(s) were submitted for interpretation post-operatively. COMPARISON:  LEFT hip x-rays and CT of the LEFT hip performed yesterday. FINDINGS: Anatomic alignment of the LEFT hip prosthesis in the AP projection post LEFT total hip arthroplasty. IMPRESSION: Anatomic alignment in the AP projection post LEFT total hip arthroplasty. Electronically Signed   By: Evangeline Dakin  M.D.   On: 02/06/2019 20:16   Medications: . sodium chloride    . sodium chloride     . aspirin  325 mg Oral Daily  . calcitRIOL  0.25 mcg Oral Q T,Th,Sa-HD  . Chlorhexidine Gluconate Cloth  6 each Topical Q0600  . cinacalcet  30 mg Oral Q supper  . enoxaparin (LOVENOX) injection  30 mg Subcutaneous Q24H  . fluticasone furoate-vilanterol  1 puff Inhalation QPM  . lanthanum  1,000 mg Oral TID WC  . [START ON 02/09/2019] levothyroxine  75 mcg Oral Once per day on Tue Thu   And  . levothyroxine  50 mcg Oral Once per day on Sun Mon Wed Fri Sat  . senna  1 tablet Oral BID  . umeclidinium bromide  1 puff Inhalation Daily     Dialysis Orders: TTS at [facility], EDW 56kg, 2K/2Ca, L AVG, heparin 6000 bolus - Epo 5000 q HD - Calcitriol 0.12mcg PO q HD - Sensiparo 30mg  QD, Fosrenol binder.  Assessment/Plan: 1. L hip fracture d/t fall: S/p L THA 5/16. Pain controlled, may need short-term rehab?  Plan still up in the air 2. ESRD: Continue HD per TTS schedule - next 5/19. No heparin s/p recent surgery. 3. HTN/volume: Hx orthostatic hypotension. BP low side and relatively euvolemic, low UF goal. 4. Anemia: Hgb down to 8 post-op, Epo as outpatient - allergy to Mircera, will need to ask patient if can tolerate Aranesp? If so, will re-dose tomorrow. 5. Secondary hyperparathyroidism: Ca/Phos ok. Continue home binder, sensipar, calcitriol.  6. Nutrition: Alb low, will add supplements. 7. Hypothyroidism: Per primary.    Kimberly Penton, PA-C 02/08/2019, 8:43 AM  [facility] Pager: [phone]  Patient seen and examined, agree with above note with above modifications. Doing well-  Got up out of bed with pt yest.  Discharge planning ongoing for how much help she will need - routine HD tomorrow  Corliss Parish, MD 02/08/2019

## 2019-02-08 NOTE — Progress Notes (Signed)
Pt refused iron IV at this time. Stated she wants to receive it at dialysis in a.m.Marland Kitchen

## 2019-02-08 NOTE — Progress Notes (Addendum)
PROGRESS NOTE    Kimberly [name]  QMG:[id number] DOB: [birth year] DOA: 02/05/2019 PCP: [name], MD  Brief Narrative: 63 year old female with history of ESRD on hemodialysis Tuesday Thursday Saturday, history of failed renal transplant, history of CVA, hypertension, hypothyroidism, asthma presented to the emergency room following a mechanical fall on 5/15. -On further evaluation she was noted to have acute subcapital fracture of left hip orthopedics Dr. Doreatha Martin was consulted  Assessment & Plan:   Acute subcapital fracture left hip: -Following a mechanical fall  -Orthopedics consulted, underwent left hip total arthroplasty 5/16 -Continue Lovenox for DVT prophylaxis -Continue PT OT -Discharge planning, possibly home with home health services tomorrow  ESRD on TTS HD: -Nephrology following, for HD tomorrow  Acute on chronic anemia -Due to iron deficiency and kidney disease -Worsened from blood loss and hemodilution -Anemia panel with low iron, will give IV iron x1  Hx of CVA: History of cryptogenic stroke, embolic in nature.  Follows with neurology as an outpatient, had plans for TEE/ILR  -She is on aspirin 325 mg alone as she has wanted to avoid coumadin per prior documentation. -Resumed aspirin  HTN: -BP soft, hold amlodipine today  Asthma: -Stable -Continue Brio Ellipta, Spiriva, and as needed albuterol  Hypothyroidism: -Continue home Synthroid  DVT prophylaxis: Lovenox Code Status: Full code, confirmed with patient Family Communication: None present on admission Disposition Plan:  Home with home health services tomorrow if stable  Consultants:   Renal  Orthopedics   Procedures:   Antimicrobials:    Subjective:  -Feels okay, working with physical therapy considering discharge home instead of rehab  Objective: Vitals:   02/08/19 0507 02/08/19 0817 02/08/19 1354 02/08/19 1424  BP: 103/60  120/61   Pulse: 68  69   Resp: 18  12   Temp: 98 F  (36.7 C)  98.7 F (37.1 C) 99.6 F (37.6 C)  TempSrc: Oral  Oral Oral  SpO2: 93% 98% 98%   Weight:      Height:        Intake/Output Summary (Last 24 hours) at 02/08/2019 1533 Last data filed at 02/07/2019 1737 Gross per 24 hour  Intake -  Output 0 ml  Net 0 ml   Filed Weights   02/05/19 2107 02/06/19 1927 02/06/19 2353  Weight: 55.4 kg 56 kg 56 kg    Examination: Gen: Awake, Alert, Oriented X 3,  HEENT: PERRLA, Neck supple, no JVD Lungs: Decreased breath sounds both bases CVS: RRR,No Gallops,Rubs or new Murmurs Abd: soft, Non tender, non distended, BS present Extremities: Left hip with dressing Skin: no new rashes Psychiatry: Judgement and insight appear normal. Mood & affect appropriate.     Data Reviewed:   CBC: Recent Labs  Lab 02/05/19 1750 02/06/19 0218 02/08/19 0009  WBC 7.6 4.9 3.7*  NEUTROABS 6.2  --   --   HGB 12.1 11.4* 8.0*  HCT 37.4 35.5* 25.2*  MCV 97.4 98.9 100.4*  PLT 206 209 891   Basic Metabolic Panel: Recent Labs  Lab 02/05/19 1750 02/06/19 0218 02/07/19 0340 02/08/19 0009  NA 138 137 134* 133*  K 4.3 4.2 3.8 4.1  CL 95* 95* 99 96*  CO2 29 28 28 25   GLUCOSE 108* 106* 89 106*  BUN 47* 54* 14 30*  CREATININE 6.72* 7.11* 3.24* 5.39*  CALCIUM 9.5 9.3 8.4* 8.6*  PHOS  --  5.2*  --   --    GFR: Estimated Creatinine Clearance: 9.6 mL/min (A) (by C-G formula based on SCr of  5.39 mg/dL (H)). Liver Function Tests: Recent Labs  Lab 02/05/19 1750 02/06/19 0218  AST 14*  --   ALT 12  --   ALKPHOS 196*  --   BILITOT 0.7  --   PROT 6.6  --   ALBUMIN 3.3* 3.1*   No results for input(s): LIPASE, AMYLASE in the last 168 hours. No results for input(s): AMMONIA in the last 168 hours. Coagulation Profile: No results for input(s): INR, PROTIME in the last 168 hours. Cardiac Enzymes: No results for input(s): CKTOTAL, CKMB, CKMBINDEX, TROPONINI in the last 168 hours. BNP (last 3 results) No results for input(s): PROBNP in the last 8760  hours. HbA1C: No results for input(s): HGBA1C in the last 72 hours. CBG: No results for input(s): GLUCAP in the last 168 hours. Lipid Profile: No results for input(s): CHOL, HDL, LDLCALC, TRIG, CHOLHDL, LDLDIRECT in the last 72 hours. Thyroid Function Tests: No results for input(s): TSH, T4TOTAL, FREET4, T3FREE, THYROIDAB in the last 72 hours. Anemia Panel: Recent Labs    02/08/19 1007  VITAMINB12 158*  FOLATE 31.3  FERRITIN 833*  TIBC 129*  IRON 17*  RETICCTPCT 1.3   Urine analysis:    Component Value Date/Time   COLORURINE RED BIOCHEMICALS MAY BE AFFECTED BY COLOR (A) 01/03/2011 2311   APPEARANCEUR TURBID (A) 01/03/2011 2311   LABSPEC 1.013 01/03/2011 2311   PHURINE 6.0 01/03/2011 2311   GLUCOSEU 100 (A) 01/03/2011 2311   HGBUR LARGE (A) 01/03/2011 2311   BILIRUBINUR MODERATE (A) 01/03/2011 2311   KETONESUR 15 (A) 01/03/2011 2311   PROTEINUR >300 (A) 01/03/2011 2311   UROBILINOGEN 0.2 01/03/2011 2311   NITRITE POSITIVE (A) 01/03/2011 2311   LEUKOCYTESUR LARGE (A) 01/03/2011 2311   Sepsis Labs: @LABRCNTIP (procalcitonin:4,lacticidven:4)  ) Recent Results (from the past 240 hour(s))  SARS Coronavirus 2 (CEPHEID - Performed in [place] hospital lab), Hosp Order     Status: None   Collection Time: 02/05/19  7:12 PM  Result Value Ref Range Status   SARS Coronavirus 2 NEGATIVE NEGATIVE Final    Comment: (NOTE) If result is NEGATIVE SARS-CoV-2 target nucleic acids are NOT DETECTED. The SARS-CoV-2 RNA is generally detectable in upper and lower  respiratory specimens during the acute phase of infection. The lowest  concentration of SARS-CoV-2 viral copies this assay can detect is 250  copies / mL. A negative result does not preclude SARS-CoV-2 infection  and should not be used as the sole basis for treatment or other  patient management decisions.  A negative result may occur with  improper specimen collection / handling, submission of specimen other  than  nasopharyngeal swab, presence of viral mutation(s) within the  areas targeted by this assay, and inadequate number of viral copies  (<250 copies / mL). A negative result must be combined with clinical  observations, patient history, and epidemiological information. If result is POSITIVE SARS-CoV-2 target nucleic acids are DETECTED. The SARS-CoV-2 RNA is generally detectable in upper and lower  respiratory specimens dur ing the acute phase of infection.  Positive  results are indicative of active infection with SARS-CoV-2.  Clinical  correlation with patient history and other diagnostic information is  necessary to determine patient infection status.  Positive results do  not rule out bacterial infection or co-infection with other viruses. If result is PRESUMPTIVE POSTIVE SARS-CoV-2 nucleic acids MAY BE PRESENT.   A presumptive positive result was obtained on the submitted specimen  and confirmed on repeat testing.  While 2019 novel coronavirus  (SARS-CoV-2)  nucleic acids may be present in the submitted sample  additional confirmatory testing may be necessary for epidemiological  and / or clinical management purposes  to differentiate between  SARS-CoV-2 and other Sarbecovirus currently known to infect humans.  If clinically indicated additional testing with an alternate test  methodology [phone]) is advised. The SARS-CoV-2 RNA is generally  detectable in upper and lower respiratory sp ecimens during the acute  phase of infection. The expected result is Negative. Fact Sheet for Patients:  StrictlyIdeas.no Fact Sheet for Healthcare Providers: BankingDealers.co.za This test is not yet approved or cleared by the [place] FDA and has been authorized for detection and/or diagnosis of SARS-CoV-2 by FDA under an Emergency Use Authorization (EUA).  This EUA will remain in effect (meaning this test can be used) for the duration of the  COVID-19 declaration under Section 564(b)(1) of the Act, 21 U.S.C. section 360bbb-3(b)(1), unless the authorization is terminated or revoked sooner. Performed at [facility], [place] [address], [place], [place] [address]   MRSA PCR Screening     Status: None   Collection Time: 02/06/19  1:08 AM  Result Value Ref Range Status   MRSA by PCR NEGATIVE NEGATIVE Final    Comment:        The GeneXpert MRSA Assay (FDA approved for NASAL specimens only), is one component of a comprehensive MRSA colonization surveillance program. It is not intended to diagnose MRSA infection nor to guide or monitor treatment for MRSA infections. Performed at [facility], [place] [address], [place], [place] [address]          Radiology Studies: Dg C-arm 1-60 Min  Result Date: 02/06/2019 CLINICAL DATA:  ANTERIOR approach LEFT total hip arthroplasty performed due to a subcapital LEFT femoral neck fracture. EXAM: OPERATIVE LEFT HIP (WITH PELVIS IF PERFORMED) 1 VIEW TECHNIQUE: Fluoroscopic spot image(s) were submitted for interpretation post-operatively. COMPARISON:  LEFT hip x-rays and CT of the LEFT hip performed yesterday. FINDINGS: Anatomic alignment of the LEFT hip prosthesis in the AP projection post LEFT total hip arthroplasty. IMPRESSION: Anatomic alignment in the AP projection post LEFT total hip arthroplasty. Electronically Signed   By: Evangeline Dakin M.D.   On: 02/06/2019 20:16   Dg Hip Operative Unilat With Pelvis Left  Result Date: 02/06/2019 CLINICAL DATA:  ANTERIOR approach LEFT total hip arthroplasty performed due to a subcapital LEFT femoral neck fracture. EXAM: OPERATIVE LEFT HIP (WITH PELVIS IF PERFORMED) 1 VIEW TECHNIQUE: Fluoroscopic spot image(s) were submitted for interpretation post-operatively. COMPARISON:  LEFT hip x-rays and CT of the LEFT hip performed yesterday. FINDINGS: Anatomic alignment of the LEFT hip prosthesis in the AP projection post LEFT total hip arthroplasty.  IMPRESSION: Anatomic alignment in the AP projection post LEFT total hip arthroplasty. Electronically Signed   By: Evangeline Dakin M.D.   On: 02/06/2019 20:16        Scheduled Meds: . aspirin  325 mg Oral Daily  . calcitRIOL  0.25 mcg Oral Q T,Th,Sa-HD  . Chlorhexidine Gluconate Cloth  6 each Topical Q0600  . cinacalcet  30 mg Oral Q supper  . enoxaparin (LOVENOX) injection  30 mg Subcutaneous Q24H  . feeding supplement (PRO-STAT SUGAR FREE 64)  30 mL Oral BID  . fluticasone furoate-vilanterol  1 puff Inhalation QPM  . lanthanum  1,000 mg Oral TID WC  . [START ON 02/09/2019] levothyroxine  75 mcg Oral Once per day on Tue Thu   And  . levothyroxine  50 mcg Oral Once  per day on Sun Mon Wed Fri Sat  . polyethylene glycol  17 g Oral Daily  . senna  1 tablet Oral BID  . umeclidinium bromide  1 puff Inhalation Daily   Continuous Infusions: . sodium chloride    . sodium chloride       LOS: 3 days    Time spent: 65min    Domenic Polite, MD [facility]   02/08/2019, 3:33 PM

## 2019-02-08 NOTE — Plan of Care (Signed)

## 2019-02-08 NOTE — Progress Notes (Signed)
Renal Navigator notified OP HD clinic of patient's admission, s/p total hip replacement surgery, and negative rapid COVID 19 test result to provide continuity of care and safety.   Alphonzo Cruise Renal Navigator [phone]

## 2019-02-09 LAB — CBC
HCT: 22.6 % — ABNORMAL LOW (ref 36.0–46.0)
HCT: 22.4 % — ABNORMAL LOW (ref 36.0–46.0)
HCT: 26.5 % — ABNORMAL LOW (ref 36.0–46.0)
Hemoglobin: 7.1 g/dL — ABNORMAL LOW (ref 12.0–15.0)
Hemoglobin: 7.1 g/dL — ABNORMAL LOW (ref 12.0–15.0)
Hemoglobin: 8.7 g/dL — ABNORMAL LOW (ref 12.0–15.0)
MCH: 31.4 pg (ref 26.0–34.0)
MCH: 31.8 pg (ref 26.0–34.0)
MCH: 31.3 pg (ref 26.0–34.0)
MCHC: 31.4 g/dL (ref 30.0–36.0)
MCHC: 31.7 g/dL (ref 30.0–36.0)
MCHC: 32.8 g/dL (ref 30.0–36.0)
MCV: 100 fL (ref 80.0–100.0)
MCV: 100.4 fL — ABNORMAL HIGH (ref 80.0–100.0)
MCV: 95.3 fL (ref 80.0–100.0)
Platelets: 164 10*3/uL (ref 150–400)
Platelets: 164 10*3/uL (ref 150–400)
Platelets: 206 10*3/uL (ref 150–400)
RBC: 2.26 MIL/uL — ABNORMAL LOW (ref 3.87–5.11)
RBC: 2.23 MIL/uL — ABNORMAL LOW (ref 3.87–5.11)
RBC: 2.78 MIL/uL — ABNORMAL LOW (ref 3.87–5.11)
RDW: 14 % (ref 11.5–15.5)
RDW: 14 % (ref 11.5–15.5)
RDW: 15.9 % — ABNORMAL HIGH (ref 11.5–15.5)
WBC: 4 10*3/uL (ref 4.0–10.5)
WBC: 3.6 10*3/uL — ABNORMAL LOW (ref 4.0–10.5)
WBC: 3.6 10*3/uL — ABNORMAL LOW (ref 4.0–10.5)
nRBC: 0 % (ref 0.0–0.2)
nRBC: 0 % (ref 0.0–0.2)
nRBC: 0 % (ref 0.0–0.2)

## 2019-02-09 LAB — RENAL FUNCTION PANEL
Albumin: 2.3 g/dL — ABNORMAL LOW (ref 3.5–5.0)
Anion gap: 14 (ref 5–15)
BUN: 59 mg/dL — ABNORMAL HIGH (ref 8–23)
CO2: 24 mmol/L (ref 22–32)
Calcium: 8.2 mg/dL — ABNORMAL LOW (ref 8.9–10.3)
Chloride: 96 mmol/L — ABNORMAL LOW (ref 98–111)
Creatinine, Ser: 8.24 mg/dL — ABNORMAL HIGH (ref 0.44–1.00)
GFR calc Af Amer: 5 mL/min — ABNORMAL LOW (ref >60)
GFR calc non Af Amer: 5 mL/min — ABNORMAL LOW (ref >60)
Glucose, Bld: 100 mg/dL — ABNORMAL HIGH (ref 70–99)
Phosphorus: 4.8 mg/dL — ABNORMAL HIGH (ref 2.5–4.6)
Potassium: 4.8 mmol/L (ref 3.5–5.1)
Sodium: 134 mmol/L — ABNORMAL LOW (ref 135–145)

## 2019-02-09 LAB — BASIC METABOLIC PANEL
Anion gap: 12 (ref 5–15)
BUN: 55 mg/dL — ABNORMAL HIGH (ref 8–23)
CO2: 26 mmol/L (ref 22–32)
Calcium: 8.1 mg/dL — ABNORMAL LOW (ref 8.9–10.3)
Chloride: 95 mmol/L — ABNORMAL LOW (ref 98–111)
Creatinine, Ser: 7.81 mg/dL — ABNORMAL HIGH (ref 0.44–1.00)
GFR calc Af Amer: 6 mL/min — ABNORMAL LOW (ref >60)
GFR calc non Af Amer: 5 mL/min — ABNORMAL LOW (ref >60)
Glucose, Bld: 92 mg/dL (ref 70–99)
Potassium: 4.8 mmol/L (ref 3.5–5.1)
Sodium: 133 mmol/L — ABNORMAL LOW (ref 135–145)

## 2019-02-09 LAB — PREPARE RBC (CROSSMATCH)

## 2019-02-09 MED ORDER — CALCITRIOL 0.25 MCG PO CAPS
ORAL_CAPSULE | ORAL | Status: AC
  Administered 2019-02-09: 10:00:00 0.25 ug via ORAL
  Filled 2019-02-09: qty 1

## 2019-02-09 MED ORDER — DARBEPOETIN ALFA 100 MCG/0.5ML IJ SOSY
100.0000 ug | PREFILLED_SYRINGE | INTRAMUSCULAR | Status: DC
  Administered 2019-02-09: 10:00:00 100 ug via INTRAVENOUS

## 2019-02-09 MED ORDER — HYDROCODONE-ACETAMINOPHEN 5-325 MG PO TABS: 1.0000 | ORAL_TABLET | ORAL | 0 refills | Status: DC | PRN

## 2019-02-09 MED ORDER — ASPIRIN EC 325 MG PO TBEC: 325.0000 mg | DELAYED_RELEASE_TABLET | Freq: Two times a day (BID) | ORAL | 0 refills | Status: DC

## 2019-02-09 MED ORDER — SENNOSIDES-DOCUSATE SODIUM 8.6-50 MG PO TABS: 1.0000 | ORAL_TABLET | Freq: Every evening | ORAL | 0 refills | Status: DC | PRN

## 2019-02-09 MED ORDER — ASPIRIN 325 MG PO TABS: 325.0000 mg | ORAL_TABLET | Freq: Two times a day (BID) | ORAL | 1 refills | Status: DC

## 2019-02-09 MED ORDER — DARBEPOETIN ALFA 100 MCG/0.5ML IJ SOSY
PREFILLED_SYRINGE | INTRAMUSCULAR | Status: AC
  Administered 2019-02-09: 10:00:00 100 ug via INTRAVENOUS
  Filled 2019-02-09: qty 0.5

## 2019-02-09 MED ORDER — SODIUM CHLORIDE 0.9% IV SOLUTION: Freq: Once | INTRAVENOUS | Status: DC

## 2019-02-09 NOTE — Progress Notes (Signed)
PT Cancellation Note  Patient Details Name: Kimberly [name] MRN: [id number] DOB: Dec 10, 1955   Cancelled Treatment:    Reason Eval/Treat Not Completed: Other (comment)(Pt at HD will return later this pm for stair training before d/c.  )   [name] 02/09/2019, 10:11 AM  [name], PTA Acute Rehabilitation Services Pager [phone] Office [phone]

## 2019-02-09 NOTE — Plan of Care (Signed)
  Problem: Health Behavior/Discharge Planning: Goal: Ability to manage health-related needs will improve Outcome: Adequate for Discharge   Problem: Clinical Measurements: Goal: Ability to maintain clinical measurements within normal limits will improve Outcome: Adequate for Discharge Goal: Will remain free from infection Outcome: Adequate for Discharge Goal: Diagnostic test results will improve Outcome: Adequate for Discharge Goal: Respiratory complications will improve Outcome: Adequate for Discharge Goal: Cardiovascular complication will be avoided Outcome: Adequate for Discharge   Problem: Activity: Goal: Risk for activity intolerance will decrease Outcome: Adequate for Discharge   Problem: Nutrition: Goal: Adequate nutrition will be maintained Outcome: Adequate for Discharge

## 2019-02-09 NOTE — Progress Notes (Signed)
    Subjective:  Patient reports pain as mild.  Denies N/V/CP/SOB. Received PRBCs with HD today.  Objective:   VITALS:   Vitals:   02/09/19 1130 02/09/19 1136 02/09/19 1155 02/09/19 1251  BP: 117/63 121/61 120/68 (!) 143/73  Pulse: 62 70 67 77  Resp: 16 19 19 18   Temp:  98.6 F (37 C) 98.6 F (37 C) 98.4 F (36.9 C)  TempSrc:  Oral  Oral  SpO2:   100% 100%  Weight:   57.5 kg   Height:        NAD ABD soft Sensation intact distally Intact pulses distally Dorsiflexion/Plantar flexion intact Incision: dressing C/D/I Compartment soft   Lab Results  Component Value Date   WBC 3.6 (L) 02/09/2019   HGB 7.1 (L) 02/09/2019   HCT 22.4 (L) 02/09/2019   MCV 100.4 (H) 02/09/2019   PLT 164 02/09/2019   BMET    Component Value Date/Time   NA 134 (L) 02/09/2019 0742   K 4.8 02/09/2019 0742   CL 96 (L) 02/09/2019 0742   CO2 24 02/09/2019 0742   GLUCOSE 100 (H) 02/09/2019 0742   BUN 59 (H) 02/09/2019 0742   CREATININE 8.24 (H) 02/09/2019 0742   CREATININE 8.21 (H) 08/29/2014 1023   CALCIUM 8.2 (L) 02/09/2019 0742   CALCIUM 7.7 (L) 09/05/2014 0849   GFRNONAA 5 (L) 02/09/2019 0742   GFRAA 5 (L) 02/09/2019 0742     Assessment/Plan: 3 Days Post-Op   Principal Problem:   Closed left hip fracture, initial encounter (HCC) Active Problems:   Hypothyroidism, unspecified   HTN (hypertension)   Asthma   ESRD (end stage renal disease) (HCC)   History of stroke   Displaced fracture of left femoral neck (HCC)   WBAT with walker DVT ppx: Aspirin, SCDs, TEDS PO pain control PT/OT Dispo: D/C home with HEP, cont ASA BID for 6 weeks, Rx pain meds sent electronically   Hilton Cork Kleigh Hoelzer 02/09/2019, 12:59 PM   Rod Can, MD Cell: [phone] [facility] [facility] now [facility]  [place] [address], [place], [place] [address] Phone: [phone] [email or web address] Facebook  [employer]

## 2019-02-09 NOTE — Progress Notes (Signed)
Physical Therapy Treatment Patient Details Name: Kimberly [name] MRN: [id number] DOB: 1956/09/16 Today's Date: 02/09/2019    History of Present Illness 63 year old female with history of ESRD on hemodialysis Tuesday Thursday Saturday, history of failed renal transplant, history of CVA, hypertension, hypothyroidism, asthma presented to the emergency room following a mechanical fall on 5/15. now s/p L THA  02/06/19    PT Comments    Pt performed gait training and functional mobility during session.  Gt training limited to allow for stair training.  Pt required min to moderate assistance for stair negotiation.  Issued gt belt for spouse to use to assist patient during stair training.  Pt performed HEP exercises and HEP issued for home use.  Plan for return home today with HHPT and support from spouse.      Follow Up Recommendations  Follow surgeon's recommendation for DC plan and follow-up therapies;Supervision/Assistance - 24 hour;Home health PT     Equipment Recommendations  Rolling walker with 5" wheels    Recommendations for Other Services       Precautions / Restrictions Precautions Precautions: Fall Restrictions Weight Bearing Restrictions: Yes LLE Weight Bearing: Weight bearing as tolerated    Mobility  Bed Mobility Overal bed mobility: Needs Assistance Bed Mobility: Supine to Sit     Supine to sit: Supervision     General bed mobility comments: Pt required decreased assistance to mobilize to edge of bed.  Increased time noted but no physical assistance.    Transfers Overall transfer level: Needs assistance Equipment used: Rolling walker (2 wheeled) Transfers: Sit to/from Stand Sit to Stand: Min assist         General transfer comment: min A to assist standing.   Ambulation/Gait Ambulation/Gait assistance: Min guard Gait Distance (Feet): 20 Feet(total moving from bed to recliner and to and from stair case for stair training.  ) Assistive device: Rolling  walker (2 wheeled) Gait Pattern/deviations: Step-to pattern;Trunk flexed;Antalgic Gait velocity: decreased   General Gait Details: Cues for sequencing and use of RW.  Pt maintaining step to pattern for comfort.   Stairs Stairs: Yes Stairs assistance: Min assist;Mod assist Stair Management: One rail Right;No rails;Forwards;Backwards Number of Stairs: 2(+ 4) General stair comments: x2 backward with RW and min assistance, cues for sequencing and RW placement.  Trialed x4 with R rail forward, when descending 3rd step, noted buckle with moderate assistance to correct.     Wheelchair Mobility    Modified Rankin (Stroke Patients Only)       Balance Overall balance assessment: Needs assistance   Sitting balance-Leahy Scale: Fair       Standing balance-Leahy Scale: Poor                              Cognition Arousal/Alertness: Awake/alert Behavior During Therapy: WFL for tasks assessed/performed Overall Cognitive Status: Within Functional Limits for tasks assessed                                        Exercises Total Joint Exercises Ankle Circles/Pumps: AROM;Both;15 reps;Supine Quad Sets: AROM;Left;10 reps;Supine Short Arc Quad: AROM;Left;10 reps;Supine Heel Slides: AROM;10 reps;Left;Supine;AAROM Hip ABduction/ADduction: AAROM;10 reps;Left;Supine Long Arc Quad: AROM;Left;10 reps;Seated Knee Flexion: AROM;Left;10 reps;Standing Marching in Standing: AROM;10 reps;Standing;Left Standing Hip Extension: AROM;10 reps;Standing;Left General Exercises - Lower Extremity Hip ABduction/ADduction: AROM;10 reps;Standing;Left    General Comments  Pertinent Vitals/Pain Pain Assessment: 0-10 Pain Score: 2  Pain Location: L Hip Pain Descriptors / Indicators: Aching Pain Intervention(s): Monitored during session;Repositioned;Patient requesting pain meds-RN notified(Pt wanted meds before session.  )    Home Living                       Prior Function            PT Goals (current goals can now be found in the care plan section) Progress towards PT goals: Progressing toward goals    Frequency    Min 5X/week      PT Plan Current plan remains appropriate    Co-evaluation              AM-PAC PT "6 Clicks" Mobility   Outcome Measure  Help needed turning from your back to your side while in a flat bed without using bedrails?: None Help needed moving from lying on your back to sitting on the side of a flat bed without using bedrails?: A Little Help needed moving to and from a bed to a chair (including a wheelchair)?: A Little Help needed standing up from a chair using your arms (e.g., wheelchair or bedside chair)?: A Little Help needed to walk in hospital room?: A Little Help needed climbing 3-5 steps with a railing? : A Little 6 Click Score: 19    End of Session Equipment Utilized During Treatment: Gait belt Activity Tolerance: Patient tolerated treatment well Patient left: in chair;with call bell/phone within reach;with chair alarm set Nurse Communication: Mobility status       Time: 1255-1335 PT Time Calculation (min) (ACUTE ONLY): 40 min  Charges:  $Gait Training: 8-22 mins $Therapeutic Exercise: 8-22 mins $Therapeutic Activity: 8-22 mins                     [name], PTA Acute Rehabilitation Services Pager [phone] Office [phone]     Crescentia Boutwell Eli Hose 02/09/2019, 4:47 PM

## 2019-02-09 NOTE — Progress Notes (Signed)
Patient transported off unit on bed to dialysis

## 2019-02-09 NOTE — Progress Notes (Addendum)
[facility] Progress Note   Subjective:  Seen on HD - dialyzing in bed although has been out of bed with PT. Denies L hip pain today. No CP/dyspnea. 1L UF goal.  Objective Vitals:   02/09/19 0815 02/09/19 0830 02/09/19 0900 02/09/19 0915  BP: (!) 107/57 (!) 100/54 (!) 107/53 (!) 117/59  Pulse: 63 62 66 67  Resp:      Temp:      TempSrc:      SpO2:      Weight:      Height:       Physical Exam General: Well appearing, NAD. Afebrile. Heart: RRR; 3/6 systolic murmur Lungs: CTAB Abdomen: soft, non-tender Extremities: No LE edema Dialysis Access: LUE AVG + bruit  Additional Objective Labs: Basic Metabolic Panel: Recent Labs  Lab 02/06/19 0218  02/08/19 0009 02/09/19 0156 02/09/19 0742  NA 137   < > 133* 133* 134*  K 4.2   < > 4.1 4.8 4.8  CL 95*   < > 96* 95* 96*  CO2 28   < > 25 26 24   GLUCOSE 106*   < > 106* 92 100*  BUN 54*   < > 30* 55* 59*  CREATININE 7.11*   < > 5.39* 7.81* 8.24*  CALCIUM 9.3   < > 8.6* 8.1* 8.2*  PHOS 5.2*  --   --   --  4.8*   < > = values in this interval not displayed.   Liver Function Tests: Recent Labs  Lab 02/05/19 1750 02/06/19 0218 02/09/19 0742  AST 14*  --   --   ALT 12  --   --   ALKPHOS 196*  --   --   BILITOT 0.7  --   --   PROT 6.6  --   --   ALBUMIN 3.3* 3.1* 2.3*   CBC: Recent Labs  Lab 02/05/19 1750 02/06/19 0218 02/08/19 0009 02/09/19 0156 02/09/19 0743  WBC 7.6 4.9 3.7* 4.0 3.6*  NEUTROABS 6.2  --   --   --   --   HGB 12.1 11.4* 8.0* 7.1* 7.1*  HCT 37.4 35.5* 25.2* 22.6* 22.4*  MCV 97.4 98.9 100.4* 100.0 100.4*  PLT 206 209 166 164 164   Recent Labs    02/08/19 1007  IRON 17*  TIBC 129*  FERRITIN 833*   Medications: . sodium chloride    . sodium chloride    . ferumoxytol     . aspirin  325 mg Oral Daily  . calcitRIOL  0.25 mcg Oral Q T,Th,Sa-HD  . Chlorhexidine Gluconate Cloth  6 each Topical Q0600  . cinacalcet  30 mg Oral Q supper  . enoxaparin (LOVENOX) injection  30 mg  Subcutaneous Q24H  . feeding supplement (PRO-STAT SUGAR FREE 64)  30 mL Oral BID  . fluticasone furoate-vilanterol  1 puff Inhalation QPM  . lanthanum  1,000 mg Oral TID WC  . levothyroxine  75 mcg Oral Once per day on Tue Thu   And  . levothyroxine  50 mcg Oral Once per day on Sun Mon Wed Fri Sat  . polyethylene glycol  17 g Oral Daily  . senna  1 tablet Oral BID  . umeclidinium bromide  1 puff Inhalation Daily    Dialysis Orders: TTS at [facility] 4hr, EDW 56kg, 2K/2Ca, L AVG, heparin 6000 bolus - Epo 5000 q HD - Calcitriol 0.72mcg PO q HD - Sensiparo 30mg  QD, Fosrenol binder.  Assessment/Plan: 1. L hip fracture d/t fall:  S/p L THA 5/16. Pain controlled, may need short-term rehab v. home PT. 2. ESRD: Continue HD per TTS schedule; HD today - 1L UF. No heparin s/p recent surgery. 3. HTN/volume: Hx orthostatic hypotension. BP low side. Trace LLE edema which is likely more related to hip surgery than generalized volume status. 4. Anemia: Hgb down to 8 post-op, then 7.1 today. Epo as outpatient - allergy to Mircera. Pt reports has had few doses of Aranesp in the past and did ok with it - will give 142mcg today. Tsat 13% - ordered for IV iron today as well. 5. Secondary hyperparathyroidism: Ca/Phos ok. Continue home binder, sensipar, calcitriol.  6. Nutrition: Alb low, continue supplements. 7. Hypothyroidism: Per primary.  ADDENDUM (9:57am): Spoke with Dr. Broadus John, will order 1U PRBCs and give while on dialysis.  Veneta Penton, PA-C 02/09/2019, 9:30 AM  [facility] Pager: [phone]  Pt seen, examined and agree w A/P as above.  Kelly Splinter  MD 02/09/2019, 1:08 PM

## 2019-02-09 NOTE — Progress Notes (Signed)
Patient discharged to home. Verbalized understanding of all discharge instructions including incision care, discharge medications and follow up MD visits. Patient given foam dressing supplies for skin tear wound. All patient DME sent home. Patient left unit via wheelchair with volunteer services.

## 2019-02-10 DIAGNOSIS — J45909 Unspecified asthma, uncomplicated: Secondary | ICD-10-CM | POA: Diagnosis not present

## 2019-02-10 DIAGNOSIS — N048 Nephrotic syndrome with other morphologic changes: Secondary | ICD-10-CM | POA: Diagnosis not present

## 2019-02-10 DIAGNOSIS — N028 Recurrent and persistent hematuria with other morphologic changes: Secondary | ICD-10-CM | POA: Diagnosis not present

## 2019-02-10 DIAGNOSIS — D631 Anemia in chronic kidney disease: Secondary | ICD-10-CM | POA: Diagnosis not present

## 2019-02-10 DIAGNOSIS — S72012D Unspecified intracapsular fracture of left femur, subsequent encounter for closed fracture with routine healing: Secondary | ICD-10-CM | POA: Diagnosis not present

## 2019-02-10 DIAGNOSIS — E039 Hypothyroidism, unspecified: Secondary | ICD-10-CM | POA: Diagnosis not present

## 2019-02-10 DIAGNOSIS — T8612 Kidney transplant failure: Secondary | ICD-10-CM | POA: Diagnosis not present

## 2019-02-10 DIAGNOSIS — I120 Hypertensive chronic kidney disease with stage 5 chronic kidney disease or end stage renal disease: Secondary | ICD-10-CM | POA: Diagnosis not present

## 2019-02-10 DIAGNOSIS — D509 Iron deficiency anemia, unspecified: Secondary | ICD-10-CM | POA: Diagnosis not present

## 2019-02-10 DIAGNOSIS — R05 Cough: Secondary | ICD-10-CM | POA: Diagnosis not present

## 2019-02-10 DIAGNOSIS — I480 Paroxysmal atrial fibrillation: Secondary | ICD-10-CM | POA: Diagnosis not present

## 2019-02-10 DIAGNOSIS — N2581 Secondary hyperparathyroidism of renal origin: Secondary | ICD-10-CM | POA: Diagnosis not present

## 2019-02-10 DIAGNOSIS — I440 Atrioventricular block, first degree: Secondary | ICD-10-CM | POA: Diagnosis not present

## 2019-02-10 DIAGNOSIS — I951 Orthostatic hypotension: Secondary | ICD-10-CM | POA: Diagnosis not present

## 2019-02-10 DIAGNOSIS — E559 Vitamin D deficiency, unspecified: Secondary | ICD-10-CM | POA: Diagnosis not present

## 2019-02-10 DIAGNOSIS — N186 End stage renal disease: Secondary | ICD-10-CM | POA: Diagnosis not present

## 2019-02-10 LAB — BPAM RBC
Blood Product Expiration Date: 202005282359
ISSUE DATE / TIME: 202005191116
Unit Type and Rh: 7300

## 2019-02-10 LAB — TYPE AND SCREEN
ABO/RH(D): B POS
Antibody Screen: NEGATIVE
Unit division: 0

## 2019-02-11 DIAGNOSIS — D509 Iron deficiency anemia, unspecified: Secondary | ICD-10-CM | POA: Diagnosis not present

## 2019-02-11 DIAGNOSIS — R52 Pain, unspecified: Secondary | ICD-10-CM | POA: Diagnosis not present

## 2019-02-11 DIAGNOSIS — N2581 Secondary hyperparathyroidism of renal origin: Secondary | ICD-10-CM | POA: Diagnosis not present

## 2019-02-11 DIAGNOSIS — R51 Headache: Secondary | ICD-10-CM | POA: Diagnosis not present

## 2019-02-11 DIAGNOSIS — D689 Coagulation defect, unspecified: Secondary | ICD-10-CM | POA: Diagnosis not present

## 2019-02-11 DIAGNOSIS — E039 Hypothyroidism, unspecified: Secondary | ICD-10-CM | POA: Diagnosis not present

## 2019-02-11 DIAGNOSIS — N186 End stage renal disease: Secondary | ICD-10-CM | POA: Diagnosis not present

## 2019-02-11 DIAGNOSIS — R509 Fever, unspecified: Secondary | ICD-10-CM | POA: Diagnosis not present

## 2019-02-11 DIAGNOSIS — D631 Anemia in chronic kidney disease: Secondary | ICD-10-CM | POA: Diagnosis not present

## 2019-02-13 DIAGNOSIS — R52 Pain, unspecified: Secondary | ICD-10-CM | POA: Diagnosis not present

## 2019-02-13 DIAGNOSIS — N2581 Secondary hyperparathyroidism of renal origin: Secondary | ICD-10-CM | POA: Diagnosis not present

## 2019-02-13 DIAGNOSIS — D689 Coagulation defect, unspecified: Secondary | ICD-10-CM | POA: Diagnosis not present

## 2019-02-13 DIAGNOSIS — N186 End stage renal disease: Secondary | ICD-10-CM | POA: Diagnosis not present

## 2019-02-13 DIAGNOSIS — E039 Hypothyroidism, unspecified: Secondary | ICD-10-CM | POA: Diagnosis not present

## 2019-02-13 DIAGNOSIS — R509 Fever, unspecified: Secondary | ICD-10-CM | POA: Diagnosis not present

## 2019-02-13 DIAGNOSIS — D509 Iron deficiency anemia, unspecified: Secondary | ICD-10-CM | POA: Diagnosis not present

## 2019-02-13 DIAGNOSIS — R51 Headache: Secondary | ICD-10-CM | POA: Diagnosis not present

## 2019-02-13 DIAGNOSIS — D631 Anemia in chronic kidney disease: Secondary | ICD-10-CM | POA: Diagnosis not present

## 2019-02-16 DIAGNOSIS — D631 Anemia in chronic kidney disease: Secondary | ICD-10-CM | POA: Diagnosis not present

## 2019-02-16 DIAGNOSIS — R52 Pain, unspecified: Secondary | ICD-10-CM | POA: Diagnosis not present

## 2019-02-16 DIAGNOSIS — D689 Coagulation defect, unspecified: Secondary | ICD-10-CM | POA: Diagnosis not present

## 2019-02-16 DIAGNOSIS — D509 Iron deficiency anemia, unspecified: Secondary | ICD-10-CM | POA: Diagnosis not present

## 2019-02-16 DIAGNOSIS — N186 End stage renal disease: Secondary | ICD-10-CM | POA: Diagnosis not present

## 2019-02-16 DIAGNOSIS — N2581 Secondary hyperparathyroidism of renal origin: Secondary | ICD-10-CM | POA: Diagnosis not present

## 2019-02-16 DIAGNOSIS — R509 Fever, unspecified: Secondary | ICD-10-CM | POA: Diagnosis not present

## 2019-02-16 DIAGNOSIS — E039 Hypothyroidism, unspecified: Secondary | ICD-10-CM | POA: Diagnosis not present

## 2019-02-16 DIAGNOSIS — R51 Headache: Secondary | ICD-10-CM | POA: Diagnosis not present

## 2019-02-17 NOTE — Discharge Summary (Signed)
Physician Discharge Summary  Kimberly [name] PFX:[id number] DOB: 05/17/1956 DOA: 02/05/2019  PCP: [name], MD  Admit date: 02/05/2019 Discharge date: 02/09/2019  Time spent: 35 minutes  Recommendations for Outpatient Follow-up:  1. PCP Dr.[name] in 1 week 2. Orthopedics Dr.Swintek in 1-2weeks 3. Continue ASA 325mg  BID for 1 month and then back to 325mg  daily   Discharge Diagnoses:  Principal Problem:   Closed left hip fracture, initial encounter [facility]) Active Problems:   Hypothyroidism, unspecified   HTN (hypertension)   Asthma   ESRD (end stage renal disease) ([place])   History of stroke   Displaced fracture of left femoral neck ([place])   Discharge Condition: stable  Diet recommendation: renal  Filed Weights   02/06/19 2353 02/09/19 0740 02/09/19 1155  Weight: 56 kg 58.4 kg 57.5 kg    History of present illness:  63 year old female with history of ESRD on hemodialysis Tuesday Thursday Saturday, history of failed renal transplant, history of CVA, hypertension, hypothyroidism, asthma presented to the emergency room following a mechanical fall on 5/15.  Hospital Course:   Acute subcapital fracture left hip: -Following a mechanical fall  -Orthopedics consulted, underwent left hip total arthroplasty 5/16 -per Ortho she will be on ASA 325mg  BID for 53month -PT OT eval completed, Home health PT recommended and set up at discharge  ESRD on TTS HD: -Nephrology following, underwent dialysis this admission, stable  Hx of CVA: History of cryptogenic stroke, embolic in nature. Follows with neurology as an outpatient, had plans for TEE/ILR  -She is on aspirin 325 mgdaily, this was changed to ASA 325mg  BID for 1 month and then back to 325mg  daily -per chart review shehas wanted to avoid coumadin per prior documentation.  HTN: -resumed amlodipine  Asthma: -Stable -Continue Brio Ellipta, Spiriva, and as needed albuterol  Hypothyroidism: -Continue home  Synthroid  Consultants:   Renal  Orthopedics   Procedures: PROCEDURE: Left total hip arthroplasty, anterior approach  Discharge Exam: Vitals:   02/09/19 1155 02/09/19 1251  BP: 120/68 (!) 143/73  Pulse: 67 77  Resp: 19 18  Temp: 98.6 F (37 C) 98.4 F (36.9 C)  SpO2: 100% 100%    General: AAOx3 Cardiovascular: S1S2/RRR Respiratory: CTAB  Discharge Instructions   Discharge Instructions    Discharge instructions   Complete by:  As directed    Renal Diet   Increase activity slowly   Complete by:  As directed      Allergies as of 02/09/2019      Reactions   Mircera [methoxy Polyethylene Glycol-epoetin Beta] Other (See Comments)   Severe joint and muscle pain   Dapsone Rash, Other (See Comments)   Pain in feet   Pregabalin Rash, Other (See Comments)   fever   Sulfonamide Derivatives Rash   Tramadol Rash, Other (See Comments)   Rash on cheek      Medication List    STOP taking these medications   ibuprofen 200 MG tablet Commonly known as:  ADVIL   oxyCODONE-acetaminophen 5-325 MG tablet Commonly known as:  Percocet     TAKE these medications   amLODipine 5 MG tablet Commonly known as:  NORVASC Take 5 mg by mouth at bedtime.   aspirin EC 325 MG tablet Take 1 tablet (325 mg total) by mouth 2 (two) times a day. What changed:  when to take this   Breo Ellipta 200-25 MCG/INH Aepb Generic drug:  fluticasone furoate-vilanterol Inhale 1 puff into the lungs every evening.   cinacalcet 30 MG tablet Commonly  known as:  SENSIPAR Take 30 mg by mouth daily after supper.   EPOETIN ALFA IJ Inject into the skin See admin instructions. Inject  subcutaneously at dialysis as needed for low hemoglobin   HYDROcodone-acetaminophen 5-325 MG tablet Commonly known as:  NORCO/VICODIN Take 1 tablet by mouth every 4 (four) hours as needed for moderate pain.   hydroxychloroquine 200 MG tablet Commonly known as:  PLAQUENIL Take 200 mg by mouth daily with lunch.    lanthanum 1000 MG chewable tablet Commonly known as:  FOSRENOL Chew 1,000 mg by mouth 3 (three) times daily with meals.   levothyroxine 50 MCG tablet Commonly known as:  SYNTHROID Take 50-75 mcg by mouth See admin instructions. Take  1 1/2 tablets (75 mcg) by mouth daily on Tuesday and Thursday nights, take 1 tablet (50 mcg) by mouth daily on all other nights of the week   OVER THE COUNTER MEDICATION Take 4 capsules by mouth 2 (two) times a day. Juice plus - 1 fruit, 1 vegetable, 1 berries, 1 omega - twice daily   PSYLLIUM PO Take 3 capsules by mouth every evening.   senna-docusate 8.6-50 MG tablet Commonly known as:  Senokot-S Take 1 tablet by mouth at bedtime as needed for mild constipation.   Spiriva Respimat 1.25 MCG/ACT Aers Generic drug:  Tiotropium Bromide Monohydrate Inhale 2 puffs into the lungs daily.   temazepam 15 MG capsule Commonly known as:  RESTORIL Take 15 mg by mouth at bedtime as needed for sleep.      Allergies  Allergen Reactions  . Mircera [Methoxy Polyethylene Glycol-Epoetin Beta] Other (See Comments)    Severe joint and muscle pain  . Dapsone Rash and Other (See Comments)    Pain in feet  . Pregabalin Rash and Other (See Comments)    fever  . Sulfonamide Derivatives Rash  . Tramadol Rash and Other (See Comments)    Rash on cheek   Follow-up Information    Swinteck, Aaron Edelman, MD. Schedule an appointment as soon as possible for a visit in 2 weeks.   Specialty:  Orthopedic Surgery Why:  For wound re-check, For suture removal Contact information: [address] [place] [address] [phone]        Care, [facility] Follow up.   Specialty:  Home Health Services Contact information: [place] [address] [phone]            The results of significant diagnostics from this hospitalization (including imaging, microbiology, ancillary and laboratory) are listed below for reference.     Significant Diagnostic Studies: Ct Hip Left Wo Contrast  Result Date: 02/05/2019 CLINICAL DATA:  Left hip pain after a fall out of bed. Initial encounter. EXAM: CT OF THE LEFT HIP WITHOUT CONTRAST TECHNIQUE: Multidetector CT imaging of the left hip was performed according to the standard protocol. Multiplanar CT image reconstructions were also generated. COMPARISON:  Plain films left hip earlier today. FINDINGS: Bones/Joint/Cartilage As seen on the comparison examination, the patient has an acute subcapital fracture of the left hip. The superior aspect of the fracture is impacted 1.3 cm. There is minimal anterior displacement femoral neck. No other is seen. No lytic or sclerotic lesion. Ligaments Suboptimally assessed by CT. Muscles and Tendons Intact. Soft tissues Contrast in the colon and diverticulosis noted. IMPRESSION: Acute subcapital fracture left hip. Electronically Signed   By: Inge Rise M.D.   On: 02/05/2019 19:59   Dg C-arm 1-60 Min  Result Date: 02/06/2019 CLINICAL  DATA:  ANTERIOR approach LEFT total hip arthroplasty performed due to a subcapital LEFT femoral neck fracture. EXAM: OPERATIVE LEFT HIP (WITH PELVIS IF PERFORMED) 1 VIEW TECHNIQUE: Fluoroscopic spot image(s) were submitted for interpretation post-operatively. COMPARISON:  LEFT hip x-rays and CT of the LEFT hip performed yesterday. FINDINGS: Anatomic alignment of the LEFT hip prosthesis in the AP projection post LEFT total hip arthroplasty. IMPRESSION: Anatomic alignment in the AP projection post LEFT total hip arthroplasty. Electronically Signed   By: Evangeline Dakin M.D.   On: 02/06/2019 20:16   Dg Hip Operative Unilat With Pelvis Left  Result Date: 02/06/2019 CLINICAL DATA:  ANTERIOR approach LEFT total hip arthroplasty performed due to a subcapital LEFT femoral neck fracture. EXAM: OPERATIVE LEFT HIP (WITH PELVIS IF PERFORMED) 1 VIEW TECHNIQUE: Fluoroscopic spot image(s) were submitted for interpretation  post-operatively. COMPARISON:  LEFT hip x-rays and CT of the LEFT hip performed yesterday. FINDINGS: Anatomic alignment of the LEFT hip prosthesis in the AP projection post LEFT total hip arthroplasty. IMPRESSION: Anatomic alignment in the AP projection post LEFT total hip arthroplasty. Electronically Signed   By: Evangeline Dakin M.D.   On: 02/06/2019 20:16   Dg Hip Unilat W Or Wo Pelvis 2-3 Views Left  Result Date: 02/05/2019 CLINICAL DATA:  Left hip pain due to a fall today. EXAM: DG HIP (WITH OR WITHOUT PELVIS) 2-3V LEFT COMPARISON:  None. FINDINGS: The patient has an acute subcapital fracture of the left hip. No other acute bony or joint abnormality. Contrast material in the colon is noted. Diverticulosis scratch the diverticular disease is seen. IMPRESSION: Acute subcapital fracture left hip. Electronically Signed   By: Inge Rise M.D.   On: 02/05/2019 17:56    Microbiology: No results found for this or any previous visit (from the past 240 hour(s)).   Labs: Basic Metabolic Panel: No results for input(s): NA, K, CL, CO2, GLUCOSE, BUN, CREATININE, CALCIUM, MG, PHOS in the last 168 hours. Liver Function Tests: No results for input(s): AST, ALT, ALKPHOS, BILITOT, PROT, ALBUMIN in the last 168 hours. No results for input(s): LIPASE, AMYLASE in the last 168 hours. No results for input(s): AMMONIA in the last 168 hours. CBC: No results for input(s): WBC, NEUTROABS, HGB, HCT, MCV, PLT in the last 168 hours. Cardiac Enzymes: No results for input(s): CKTOTAL, CKMB, CKMBINDEX, TROPONINI in the last 168 hours. BNP: BNP (last 3 results) No results for input(s): BNP in the last 8760 hours.  ProBNP (last 3 results) No results for input(s): PROBNP in the last 8760 hours.  CBG: No results for input(s): GLUCAP in the last 168 hours.     Signed:  Domenic Polite MD.  [facility] 02/17/2019, 5:26 PM

## 2019-02-18 DIAGNOSIS — N2581 Secondary hyperparathyroidism of renal origin: Secondary | ICD-10-CM | POA: Diagnosis not present

## 2019-02-18 DIAGNOSIS — R51 Headache: Secondary | ICD-10-CM | POA: Diagnosis not present

## 2019-02-18 DIAGNOSIS — N186 End stage renal disease: Secondary | ICD-10-CM | POA: Diagnosis not present

## 2019-02-18 DIAGNOSIS — E039 Hypothyroidism, unspecified: Secondary | ICD-10-CM | POA: Diagnosis not present

## 2019-02-18 DIAGNOSIS — D689 Coagulation defect, unspecified: Secondary | ICD-10-CM | POA: Diagnosis not present

## 2019-02-18 DIAGNOSIS — R52 Pain, unspecified: Secondary | ICD-10-CM | POA: Diagnosis not present

## 2019-02-18 DIAGNOSIS — D509 Iron deficiency anemia, unspecified: Secondary | ICD-10-CM | POA: Diagnosis not present

## 2019-02-18 DIAGNOSIS — R509 Fever, unspecified: Secondary | ICD-10-CM | POA: Diagnosis not present

## 2019-02-18 DIAGNOSIS — D631 Anemia in chronic kidney disease: Secondary | ICD-10-CM | POA: Diagnosis not present

## 2019-02-20 DIAGNOSIS — R509 Fever, unspecified: Secondary | ICD-10-CM | POA: Diagnosis not present

## 2019-02-20 DIAGNOSIS — D509 Iron deficiency anemia, unspecified: Secondary | ICD-10-CM | POA: Diagnosis not present

## 2019-02-20 DIAGNOSIS — R51 Headache: Secondary | ICD-10-CM | POA: Diagnosis not present

## 2019-02-20 DIAGNOSIS — D631 Anemia in chronic kidney disease: Secondary | ICD-10-CM | POA: Diagnosis not present

## 2019-02-20 DIAGNOSIS — R52 Pain, unspecified: Secondary | ICD-10-CM | POA: Diagnosis not present

## 2019-02-20 DIAGNOSIS — E039 Hypothyroidism, unspecified: Secondary | ICD-10-CM | POA: Diagnosis not present

## 2019-02-20 DIAGNOSIS — N186 End stage renal disease: Secondary | ICD-10-CM | POA: Diagnosis not present

## 2019-02-20 DIAGNOSIS — N2581 Secondary hyperparathyroidism of renal origin: Secondary | ICD-10-CM | POA: Diagnosis not present

## 2019-02-20 DIAGNOSIS — D689 Coagulation defect, unspecified: Secondary | ICD-10-CM | POA: Diagnosis not present

## 2019-02-21 DIAGNOSIS — T8612 Kidney transplant failure: Secondary | ICD-10-CM | POA: Diagnosis not present

## 2019-02-21 DIAGNOSIS — N186 End stage renal disease: Secondary | ICD-10-CM | POA: Diagnosis not present

## 2019-02-21 DIAGNOSIS — Z992 Dependence on renal dialysis: Secondary | ICD-10-CM | POA: Diagnosis not present

## 2019-02-22 DIAGNOSIS — S72032D Displaced midcervical fracture of left femur, subsequent encounter for closed fracture with routine healing: Secondary | ICD-10-CM | POA: Diagnosis not present

## 2019-02-23 DIAGNOSIS — E039 Hypothyroidism, unspecified: Secondary | ICD-10-CM | POA: Diagnosis not present

## 2019-02-23 DIAGNOSIS — D509 Iron deficiency anemia, unspecified: Secondary | ICD-10-CM | POA: Diagnosis not present

## 2019-02-23 DIAGNOSIS — N2581 Secondary hyperparathyroidism of renal origin: Secondary | ICD-10-CM | POA: Diagnosis not present

## 2019-02-23 DIAGNOSIS — D631 Anemia in chronic kidney disease: Secondary | ICD-10-CM | POA: Diagnosis not present

## 2019-02-23 DIAGNOSIS — R51 Headache: Secondary | ICD-10-CM | POA: Diagnosis not present

## 2019-02-23 DIAGNOSIS — D689 Coagulation defect, unspecified: Secondary | ICD-10-CM | POA: Diagnosis not present

## 2019-02-23 DIAGNOSIS — N186 End stage renal disease: Secondary | ICD-10-CM | POA: Diagnosis not present

## 2019-02-25 DIAGNOSIS — N186 End stage renal disease: Secondary | ICD-10-CM | POA: Diagnosis not present

## 2019-02-25 DIAGNOSIS — D631 Anemia in chronic kidney disease: Secondary | ICD-10-CM | POA: Diagnosis not present

## 2019-02-25 DIAGNOSIS — D509 Iron deficiency anemia, unspecified: Secondary | ICD-10-CM | POA: Diagnosis not present

## 2019-02-25 DIAGNOSIS — R51 Headache: Secondary | ICD-10-CM | POA: Diagnosis not present

## 2019-02-25 DIAGNOSIS — N2581 Secondary hyperparathyroidism of renal origin: Secondary | ICD-10-CM | POA: Diagnosis not present

## 2019-02-25 DIAGNOSIS — E039 Hypothyroidism, unspecified: Secondary | ICD-10-CM | POA: Diagnosis not present

## 2019-02-25 DIAGNOSIS — D689 Coagulation defect, unspecified: Secondary | ICD-10-CM | POA: Diagnosis not present

## 2019-02-27 DIAGNOSIS — R51 Headache: Secondary | ICD-10-CM | POA: Diagnosis not present

## 2019-02-27 DIAGNOSIS — N2581 Secondary hyperparathyroidism of renal origin: Secondary | ICD-10-CM | POA: Diagnosis not present

## 2019-02-27 DIAGNOSIS — D689 Coagulation defect, unspecified: Secondary | ICD-10-CM | POA: Diagnosis not present

## 2019-02-27 DIAGNOSIS — N186 End stage renal disease: Secondary | ICD-10-CM | POA: Diagnosis not present

## 2019-02-27 DIAGNOSIS — D631 Anemia in chronic kidney disease: Secondary | ICD-10-CM | POA: Diagnosis not present

## 2019-02-27 DIAGNOSIS — D509 Iron deficiency anemia, unspecified: Secondary | ICD-10-CM | POA: Diagnosis not present

## 2019-02-27 DIAGNOSIS — E039 Hypothyroidism, unspecified: Secondary | ICD-10-CM | POA: Diagnosis not present

## 2019-03-02 DIAGNOSIS — D689 Coagulation defect, unspecified: Secondary | ICD-10-CM | POA: Diagnosis not present

## 2019-03-02 DIAGNOSIS — R51 Headache: Secondary | ICD-10-CM | POA: Diagnosis not present

## 2019-03-02 DIAGNOSIS — E039 Hypothyroidism, unspecified: Secondary | ICD-10-CM | POA: Diagnosis not present

## 2019-03-02 DIAGNOSIS — N2581 Secondary hyperparathyroidism of renal origin: Secondary | ICD-10-CM | POA: Diagnosis not present

## 2019-03-02 DIAGNOSIS — N186 End stage renal disease: Secondary | ICD-10-CM | POA: Diagnosis not present

## 2019-03-02 DIAGNOSIS — D631 Anemia in chronic kidney disease: Secondary | ICD-10-CM | POA: Diagnosis not present

## 2019-03-02 DIAGNOSIS — D509 Iron deficiency anemia, unspecified: Secondary | ICD-10-CM | POA: Diagnosis not present

## 2019-03-04 DIAGNOSIS — D689 Coagulation defect, unspecified: Secondary | ICD-10-CM | POA: Diagnosis not present

## 2019-03-04 DIAGNOSIS — E039 Hypothyroidism, unspecified: Secondary | ICD-10-CM | POA: Diagnosis not present

## 2019-03-04 DIAGNOSIS — N2581 Secondary hyperparathyroidism of renal origin: Secondary | ICD-10-CM | POA: Diagnosis not present

## 2019-03-04 DIAGNOSIS — D631 Anemia in chronic kidney disease: Secondary | ICD-10-CM | POA: Diagnosis not present

## 2019-03-04 DIAGNOSIS — N186 End stage renal disease: Secondary | ICD-10-CM | POA: Diagnosis not present

## 2019-03-04 DIAGNOSIS — D509 Iron deficiency anemia, unspecified: Secondary | ICD-10-CM | POA: Diagnosis not present

## 2019-03-04 DIAGNOSIS — R51 Headache: Secondary | ICD-10-CM | POA: Diagnosis not present

## 2019-03-05 ENCOUNTER — Other Ambulatory Visit: Payer: Self-pay | Admitting: Nephrology

## 2019-03-06 DIAGNOSIS — E039 Hypothyroidism, unspecified: Secondary | ICD-10-CM | POA: Diagnosis not present

## 2019-03-06 DIAGNOSIS — D631 Anemia in chronic kidney disease: Secondary | ICD-10-CM | POA: Diagnosis not present

## 2019-03-06 DIAGNOSIS — D509 Iron deficiency anemia, unspecified: Secondary | ICD-10-CM | POA: Diagnosis not present

## 2019-03-06 DIAGNOSIS — N2581 Secondary hyperparathyroidism of renal origin: Secondary | ICD-10-CM | POA: Diagnosis not present

## 2019-03-06 DIAGNOSIS — D689 Coagulation defect, unspecified: Secondary | ICD-10-CM | POA: Diagnosis not present

## 2019-03-06 DIAGNOSIS — N186 End stage renal disease: Secondary | ICD-10-CM | POA: Diagnosis not present

## 2019-03-06 DIAGNOSIS — R51 Headache: Secondary | ICD-10-CM | POA: Diagnosis not present

## 2019-03-09 ENCOUNTER — Other Ambulatory Visit: Payer: Self-pay | Admitting: Nephrology

## 2019-03-09 ENCOUNTER — Other Ambulatory Visit: Payer: Self-pay | Admitting: Critical Care Medicine

## 2019-03-09 DIAGNOSIS — D509 Iron deficiency anemia, unspecified: Secondary | ICD-10-CM | POA: Diagnosis not present

## 2019-03-09 DIAGNOSIS — D631 Anemia in chronic kidney disease: Secondary | ICD-10-CM | POA: Diagnosis not present

## 2019-03-09 DIAGNOSIS — R51 Headache: Secondary | ICD-10-CM | POA: Diagnosis not present

## 2019-03-09 DIAGNOSIS — D689 Coagulation defect, unspecified: Secondary | ICD-10-CM | POA: Diagnosis not present

## 2019-03-09 DIAGNOSIS — N2581 Secondary hyperparathyroidism of renal origin: Secondary | ICD-10-CM | POA: Diagnosis not present

## 2019-03-09 DIAGNOSIS — E039 Hypothyroidism, unspecified: Secondary | ICD-10-CM | POA: Diagnosis not present

## 2019-03-09 DIAGNOSIS — N186 End stage renal disease: Secondary | ICD-10-CM | POA: Diagnosis not present

## 2019-03-09 DIAGNOSIS — K650 Generalized (acute) peritonitis: Secondary | ICD-10-CM

## 2019-03-11 DIAGNOSIS — D509 Iron deficiency anemia, unspecified: Secondary | ICD-10-CM | POA: Diagnosis not present

## 2019-03-11 DIAGNOSIS — D631 Anemia in chronic kidney disease: Secondary | ICD-10-CM | POA: Diagnosis not present

## 2019-03-11 DIAGNOSIS — N2581 Secondary hyperparathyroidism of renal origin: Secondary | ICD-10-CM | POA: Diagnosis not present

## 2019-03-11 DIAGNOSIS — E039 Hypothyroidism, unspecified: Secondary | ICD-10-CM | POA: Diagnosis not present

## 2019-03-11 DIAGNOSIS — D689 Coagulation defect, unspecified: Secondary | ICD-10-CM | POA: Diagnosis not present

## 2019-03-11 DIAGNOSIS — N186 End stage renal disease: Secondary | ICD-10-CM | POA: Diagnosis not present

## 2019-03-11 DIAGNOSIS — R51 Headache: Secondary | ICD-10-CM | POA: Diagnosis not present

## 2019-03-13 DIAGNOSIS — D689 Coagulation defect, unspecified: Secondary | ICD-10-CM | POA: Diagnosis not present

## 2019-03-13 DIAGNOSIS — N2581 Secondary hyperparathyroidism of renal origin: Secondary | ICD-10-CM | POA: Diagnosis not present

## 2019-03-13 DIAGNOSIS — E039 Hypothyroidism, unspecified: Secondary | ICD-10-CM | POA: Diagnosis not present

## 2019-03-13 DIAGNOSIS — R51 Headache: Secondary | ICD-10-CM | POA: Diagnosis not present

## 2019-03-13 DIAGNOSIS — N186 End stage renal disease: Secondary | ICD-10-CM | POA: Diagnosis not present

## 2019-03-13 DIAGNOSIS — D631 Anemia in chronic kidney disease: Secondary | ICD-10-CM | POA: Diagnosis not present

## 2019-03-13 DIAGNOSIS — D509 Iron deficiency anemia, unspecified: Secondary | ICD-10-CM | POA: Diagnosis not present

## 2019-03-15 DIAGNOSIS — I120 Hypertensive chronic kidney disease with stage 5 chronic kidney disease or end stage renal disease: Secondary | ICD-10-CM | POA: Diagnosis not present

## 2019-03-15 DIAGNOSIS — E559 Vitamin D deficiency, unspecified: Secondary | ICD-10-CM | POA: Diagnosis not present

## 2019-03-15 DIAGNOSIS — N186 End stage renal disease: Secondary | ICD-10-CM | POA: Diagnosis not present

## 2019-03-15 DIAGNOSIS — S72012D Unspecified intracapsular fracture of left femur, subsequent encounter for closed fracture with routine healing: Secondary | ICD-10-CM | POA: Diagnosis not present

## 2019-03-15 DIAGNOSIS — N2581 Secondary hyperparathyroidism of renal origin: Secondary | ICD-10-CM | POA: Diagnosis not present

## 2019-03-15 DIAGNOSIS — N048 Nephrotic syndrome with other morphologic changes: Secondary | ICD-10-CM | POA: Diagnosis not present

## 2019-03-15 DIAGNOSIS — D631 Anemia in chronic kidney disease: Secondary | ICD-10-CM | POA: Diagnosis not present

## 2019-03-15 DIAGNOSIS — T8612 Kidney transplant failure: Secondary | ICD-10-CM | POA: Diagnosis not present

## 2019-03-15 DIAGNOSIS — D509 Iron deficiency anemia, unspecified: Secondary | ICD-10-CM | POA: Diagnosis not present

## 2019-03-15 DIAGNOSIS — N028 Recurrent and persistent hematuria with other morphologic changes: Secondary | ICD-10-CM | POA: Diagnosis not present

## 2019-03-15 DIAGNOSIS — R05 Cough: Secondary | ICD-10-CM | POA: Diagnosis not present

## 2019-03-15 DIAGNOSIS — J45909 Unspecified asthma, uncomplicated: Secondary | ICD-10-CM | POA: Diagnosis not present

## 2019-03-15 DIAGNOSIS — I480 Paroxysmal atrial fibrillation: Secondary | ICD-10-CM | POA: Diagnosis not present

## 2019-03-15 DIAGNOSIS — E039 Hypothyroidism, unspecified: Secondary | ICD-10-CM | POA: Diagnosis not present

## 2019-03-15 DIAGNOSIS — I440 Atrioventricular block, first degree: Secondary | ICD-10-CM | POA: Diagnosis not present

## 2019-03-15 DIAGNOSIS — I951 Orthostatic hypotension: Secondary | ICD-10-CM | POA: Diagnosis not present

## 2019-03-16 DIAGNOSIS — N186 End stage renal disease: Secondary | ICD-10-CM | POA: Diagnosis not present

## 2019-03-16 DIAGNOSIS — D631 Anemia in chronic kidney disease: Secondary | ICD-10-CM | POA: Diagnosis not present

## 2019-03-16 DIAGNOSIS — E039 Hypothyroidism, unspecified: Secondary | ICD-10-CM | POA: Diagnosis not present

## 2019-03-16 DIAGNOSIS — D689 Coagulation defect, unspecified: Secondary | ICD-10-CM | POA: Diagnosis not present

## 2019-03-16 DIAGNOSIS — R51 Headache: Secondary | ICD-10-CM | POA: Diagnosis not present

## 2019-03-16 DIAGNOSIS — D509 Iron deficiency anemia, unspecified: Secondary | ICD-10-CM | POA: Diagnosis not present

## 2019-03-16 DIAGNOSIS — N2581 Secondary hyperparathyroidism of renal origin: Secondary | ICD-10-CM | POA: Diagnosis not present

## 2019-03-18 DIAGNOSIS — E039 Hypothyroidism, unspecified: Secondary | ICD-10-CM | POA: Diagnosis not present

## 2019-03-18 DIAGNOSIS — N2581 Secondary hyperparathyroidism of renal origin: Secondary | ICD-10-CM | POA: Diagnosis not present

## 2019-03-18 DIAGNOSIS — D509 Iron deficiency anemia, unspecified: Secondary | ICD-10-CM | POA: Diagnosis not present

## 2019-03-18 DIAGNOSIS — D689 Coagulation defect, unspecified: Secondary | ICD-10-CM | POA: Diagnosis not present

## 2019-03-18 DIAGNOSIS — D631 Anemia in chronic kidney disease: Secondary | ICD-10-CM | POA: Diagnosis not present

## 2019-03-18 DIAGNOSIS — N186 End stage renal disease: Secondary | ICD-10-CM | POA: Diagnosis not present

## 2019-03-18 DIAGNOSIS — R51 Headache: Secondary | ICD-10-CM | POA: Diagnosis not present

## 2019-03-20 DIAGNOSIS — E039 Hypothyroidism, unspecified: Secondary | ICD-10-CM | POA: Diagnosis not present

## 2019-03-20 DIAGNOSIS — D509 Iron deficiency anemia, unspecified: Secondary | ICD-10-CM | POA: Diagnosis not present

## 2019-03-20 DIAGNOSIS — R51 Headache: Secondary | ICD-10-CM | POA: Diagnosis not present

## 2019-03-20 DIAGNOSIS — N2581 Secondary hyperparathyroidism of renal origin: Secondary | ICD-10-CM | POA: Diagnosis not present

## 2019-03-20 DIAGNOSIS — D689 Coagulation defect, unspecified: Secondary | ICD-10-CM | POA: Diagnosis not present

## 2019-03-20 DIAGNOSIS — N186 End stage renal disease: Secondary | ICD-10-CM | POA: Diagnosis not present

## 2019-03-20 DIAGNOSIS — D631 Anemia in chronic kidney disease: Secondary | ICD-10-CM | POA: Diagnosis not present

## 2019-03-22 DIAGNOSIS — S72032D Displaced midcervical fracture of left femur, subsequent encounter for closed fracture with routine healing: Secondary | ICD-10-CM | POA: Diagnosis not present

## 2019-03-23 DIAGNOSIS — D509 Iron deficiency anemia, unspecified: Secondary | ICD-10-CM | POA: Diagnosis not present

## 2019-03-23 DIAGNOSIS — E039 Hypothyroidism, unspecified: Secondary | ICD-10-CM | POA: Diagnosis not present

## 2019-03-23 DIAGNOSIS — D689 Coagulation defect, unspecified: Secondary | ICD-10-CM | POA: Diagnosis not present

## 2019-03-23 DIAGNOSIS — N186 End stage renal disease: Secondary | ICD-10-CM | POA: Diagnosis not present

## 2019-03-23 DIAGNOSIS — R51 Headache: Secondary | ICD-10-CM | POA: Diagnosis not present

## 2019-03-23 DIAGNOSIS — Z992 Dependence on renal dialysis: Secondary | ICD-10-CM | POA: Diagnosis not present

## 2019-03-23 DIAGNOSIS — D631 Anemia in chronic kidney disease: Secondary | ICD-10-CM | POA: Diagnosis not present

## 2019-03-23 DIAGNOSIS — T8612 Kidney transplant failure: Secondary | ICD-10-CM | POA: Diagnosis not present

## 2019-03-23 DIAGNOSIS — N2581 Secondary hyperparathyroidism of renal origin: Secondary | ICD-10-CM | POA: Diagnosis not present

## 2019-03-24 DIAGNOSIS — R51 Headache: Secondary | ICD-10-CM | POA: Diagnosis not present

## 2019-03-24 DIAGNOSIS — J329 Chronic sinusitis, unspecified: Secondary | ICD-10-CM | POA: Diagnosis not present

## 2019-03-25 DIAGNOSIS — E1129 Type 2 diabetes mellitus with other diabetic kidney complication: Secondary | ICD-10-CM | POA: Diagnosis not present

## 2019-03-25 DIAGNOSIS — N2581 Secondary hyperparathyroidism of renal origin: Secondary | ICD-10-CM | POA: Diagnosis not present

## 2019-03-25 DIAGNOSIS — E039 Hypothyroidism, unspecified: Secondary | ICD-10-CM | POA: Diagnosis not present

## 2019-03-25 DIAGNOSIS — N186 End stage renal disease: Secondary | ICD-10-CM | POA: Diagnosis not present

## 2019-03-25 DIAGNOSIS — D509 Iron deficiency anemia, unspecified: Secondary | ICD-10-CM | POA: Diagnosis not present

## 2019-03-25 DIAGNOSIS — D689 Coagulation defect, unspecified: Secondary | ICD-10-CM | POA: Diagnosis not present

## 2019-03-27 DIAGNOSIS — N186 End stage renal disease: Secondary | ICD-10-CM | POA: Diagnosis not present

## 2019-03-27 DIAGNOSIS — E1129 Type 2 diabetes mellitus with other diabetic kidney complication: Secondary | ICD-10-CM | POA: Diagnosis not present

## 2019-03-27 DIAGNOSIS — E039 Hypothyroidism, unspecified: Secondary | ICD-10-CM | POA: Diagnosis not present

## 2019-03-27 DIAGNOSIS — D509 Iron deficiency anemia, unspecified: Secondary | ICD-10-CM | POA: Diagnosis not present

## 2019-03-27 DIAGNOSIS — D689 Coagulation defect, unspecified: Secondary | ICD-10-CM | POA: Diagnosis not present

## 2019-03-27 DIAGNOSIS — N2581 Secondary hyperparathyroidism of renal origin: Secondary | ICD-10-CM | POA: Diagnosis not present

## 2019-03-29 ENCOUNTER — Inpatient Hospital Stay: Admission: RE | Admit: 2019-03-29 | Payer: Medicare Other | Source: Ambulatory Visit

## 2019-03-30 DIAGNOSIS — N2581 Secondary hyperparathyroidism of renal origin: Secondary | ICD-10-CM | POA: Diagnosis not present

## 2019-03-30 DIAGNOSIS — E1129 Type 2 diabetes mellitus with other diabetic kidney complication: Secondary | ICD-10-CM | POA: Diagnosis not present

## 2019-03-30 DIAGNOSIS — D689 Coagulation defect, unspecified: Secondary | ICD-10-CM | POA: Diagnosis not present

## 2019-03-30 DIAGNOSIS — E039 Hypothyroidism, unspecified: Secondary | ICD-10-CM | POA: Diagnosis not present

## 2019-03-30 DIAGNOSIS — D509 Iron deficiency anemia, unspecified: Secondary | ICD-10-CM | POA: Diagnosis not present

## 2019-03-30 DIAGNOSIS — N186 End stage renal disease: Secondary | ICD-10-CM | POA: Diagnosis not present

## 2019-04-01 DIAGNOSIS — D509 Iron deficiency anemia, unspecified: Secondary | ICD-10-CM | POA: Diagnosis not present

## 2019-04-01 DIAGNOSIS — N186 End stage renal disease: Secondary | ICD-10-CM | POA: Diagnosis not present

## 2019-04-01 DIAGNOSIS — E039 Hypothyroidism, unspecified: Secondary | ICD-10-CM | POA: Diagnosis not present

## 2019-04-01 DIAGNOSIS — N2581 Secondary hyperparathyroidism of renal origin: Secondary | ICD-10-CM | POA: Diagnosis not present

## 2019-04-01 DIAGNOSIS — E1129 Type 2 diabetes mellitus with other diabetic kidney complication: Secondary | ICD-10-CM | POA: Diagnosis not present

## 2019-04-01 DIAGNOSIS — D689 Coagulation defect, unspecified: Secondary | ICD-10-CM | POA: Diagnosis not present

## 2019-04-03 DIAGNOSIS — N2581 Secondary hyperparathyroidism of renal origin: Secondary | ICD-10-CM | POA: Diagnosis not present

## 2019-04-03 DIAGNOSIS — D509 Iron deficiency anemia, unspecified: Secondary | ICD-10-CM | POA: Diagnosis not present

## 2019-04-03 DIAGNOSIS — E039 Hypothyroidism, unspecified: Secondary | ICD-10-CM | POA: Diagnosis not present

## 2019-04-03 DIAGNOSIS — N186 End stage renal disease: Secondary | ICD-10-CM | POA: Diagnosis not present

## 2019-04-03 DIAGNOSIS — D689 Coagulation defect, unspecified: Secondary | ICD-10-CM | POA: Diagnosis not present

## 2019-04-03 DIAGNOSIS — E1129 Type 2 diabetes mellitus with other diabetic kidney complication: Secondary | ICD-10-CM | POA: Diagnosis not present

## 2019-04-06 DIAGNOSIS — E1129 Type 2 diabetes mellitus with other diabetic kidney complication: Secondary | ICD-10-CM | POA: Diagnosis not present

## 2019-04-06 DIAGNOSIS — E039 Hypothyroidism, unspecified: Secondary | ICD-10-CM | POA: Diagnosis not present

## 2019-04-06 DIAGNOSIS — D509 Iron deficiency anemia, unspecified: Secondary | ICD-10-CM | POA: Diagnosis not present

## 2019-04-06 DIAGNOSIS — N186 End stage renal disease: Secondary | ICD-10-CM | POA: Diagnosis not present

## 2019-04-06 DIAGNOSIS — N2581 Secondary hyperparathyroidism of renal origin: Secondary | ICD-10-CM | POA: Diagnosis not present

## 2019-04-06 DIAGNOSIS — D689 Coagulation defect, unspecified: Secondary | ICD-10-CM | POA: Diagnosis not present

## 2019-04-08 DIAGNOSIS — E039 Hypothyroidism, unspecified: Secondary | ICD-10-CM | POA: Diagnosis not present

## 2019-04-08 DIAGNOSIS — D509 Iron deficiency anemia, unspecified: Secondary | ICD-10-CM | POA: Diagnosis not present

## 2019-04-08 DIAGNOSIS — N2581 Secondary hyperparathyroidism of renal origin: Secondary | ICD-10-CM | POA: Diagnosis not present

## 2019-04-08 DIAGNOSIS — D689 Coagulation defect, unspecified: Secondary | ICD-10-CM | POA: Diagnosis not present

## 2019-04-08 DIAGNOSIS — N186 End stage renal disease: Secondary | ICD-10-CM | POA: Diagnosis not present

## 2019-04-08 DIAGNOSIS — E1129 Type 2 diabetes mellitus with other diabetic kidney complication: Secondary | ICD-10-CM | POA: Diagnosis not present

## 2019-04-10 DIAGNOSIS — N2581 Secondary hyperparathyroidism of renal origin: Secondary | ICD-10-CM | POA: Diagnosis not present

## 2019-04-10 DIAGNOSIS — N186 End stage renal disease: Secondary | ICD-10-CM | POA: Diagnosis not present

## 2019-04-10 DIAGNOSIS — E1129 Type 2 diabetes mellitus with other diabetic kidney complication: Secondary | ICD-10-CM | POA: Diagnosis not present

## 2019-04-10 DIAGNOSIS — E039 Hypothyroidism, unspecified: Secondary | ICD-10-CM | POA: Diagnosis not present

## 2019-04-10 DIAGNOSIS — D509 Iron deficiency anemia, unspecified: Secondary | ICD-10-CM | POA: Diagnosis not present

## 2019-04-10 DIAGNOSIS — D689 Coagulation defect, unspecified: Secondary | ICD-10-CM | POA: Diagnosis not present

## 2019-04-13 DIAGNOSIS — E039 Hypothyroidism, unspecified: Secondary | ICD-10-CM | POA: Diagnosis not present

## 2019-04-13 DIAGNOSIS — E1129 Type 2 diabetes mellitus with other diabetic kidney complication: Secondary | ICD-10-CM | POA: Diagnosis not present

## 2019-04-13 DIAGNOSIS — D689 Coagulation defect, unspecified: Secondary | ICD-10-CM | POA: Diagnosis not present

## 2019-04-13 DIAGNOSIS — N186 End stage renal disease: Secondary | ICD-10-CM | POA: Diagnosis not present

## 2019-04-13 DIAGNOSIS — N2581 Secondary hyperparathyroidism of renal origin: Secondary | ICD-10-CM | POA: Diagnosis not present

## 2019-04-13 DIAGNOSIS — D509 Iron deficiency anemia, unspecified: Secondary | ICD-10-CM | POA: Diagnosis not present

## 2019-04-15 DIAGNOSIS — D509 Iron deficiency anemia, unspecified: Secondary | ICD-10-CM | POA: Diagnosis not present

## 2019-04-15 DIAGNOSIS — D689 Coagulation defect, unspecified: Secondary | ICD-10-CM | POA: Diagnosis not present

## 2019-04-15 DIAGNOSIS — N2581 Secondary hyperparathyroidism of renal origin: Secondary | ICD-10-CM | POA: Diagnosis not present

## 2019-04-15 DIAGNOSIS — E1129 Type 2 diabetes mellitus with other diabetic kidney complication: Secondary | ICD-10-CM | POA: Diagnosis not present

## 2019-04-15 DIAGNOSIS — N186 End stage renal disease: Secondary | ICD-10-CM | POA: Diagnosis not present

## 2019-04-15 DIAGNOSIS — E039 Hypothyroidism, unspecified: Secondary | ICD-10-CM | POA: Diagnosis not present

## 2019-04-17 DIAGNOSIS — N2581 Secondary hyperparathyroidism of renal origin: Secondary | ICD-10-CM | POA: Diagnosis not present

## 2019-04-17 DIAGNOSIS — D509 Iron deficiency anemia, unspecified: Secondary | ICD-10-CM | POA: Diagnosis not present

## 2019-04-17 DIAGNOSIS — N186 End stage renal disease: Secondary | ICD-10-CM | POA: Diagnosis not present

## 2019-04-17 DIAGNOSIS — D689 Coagulation defect, unspecified: Secondary | ICD-10-CM | POA: Diagnosis not present

## 2019-04-17 DIAGNOSIS — E1129 Type 2 diabetes mellitus with other diabetic kidney complication: Secondary | ICD-10-CM | POA: Diagnosis not present

## 2019-04-17 DIAGNOSIS — E039 Hypothyroidism, unspecified: Secondary | ICD-10-CM | POA: Diagnosis not present

## 2019-04-20 DIAGNOSIS — E039 Hypothyroidism, unspecified: Secondary | ICD-10-CM | POA: Diagnosis not present

## 2019-04-20 DIAGNOSIS — E1129 Type 2 diabetes mellitus with other diabetic kidney complication: Secondary | ICD-10-CM | POA: Diagnosis not present

## 2019-04-20 DIAGNOSIS — D509 Iron deficiency anemia, unspecified: Secondary | ICD-10-CM | POA: Diagnosis not present

## 2019-04-20 DIAGNOSIS — D689 Coagulation defect, unspecified: Secondary | ICD-10-CM | POA: Diagnosis not present

## 2019-04-20 DIAGNOSIS — N186 End stage renal disease: Secondary | ICD-10-CM | POA: Diagnosis not present

## 2019-04-20 DIAGNOSIS — N2581 Secondary hyperparathyroidism of renal origin: Secondary | ICD-10-CM | POA: Diagnosis not present

## 2019-04-22 DIAGNOSIS — E1129 Type 2 diabetes mellitus with other diabetic kidney complication: Secondary | ICD-10-CM | POA: Diagnosis not present

## 2019-04-22 DIAGNOSIS — N186 End stage renal disease: Secondary | ICD-10-CM | POA: Diagnosis not present

## 2019-04-22 DIAGNOSIS — D689 Coagulation defect, unspecified: Secondary | ICD-10-CM | POA: Diagnosis not present

## 2019-04-22 DIAGNOSIS — E039 Hypothyroidism, unspecified: Secondary | ICD-10-CM | POA: Diagnosis not present

## 2019-04-22 DIAGNOSIS — N2581 Secondary hyperparathyroidism of renal origin: Secondary | ICD-10-CM | POA: Diagnosis not present

## 2019-04-22 DIAGNOSIS — D509 Iron deficiency anemia, unspecified: Secondary | ICD-10-CM | POA: Diagnosis not present

## 2019-04-23 DIAGNOSIS — Z992 Dependence on renal dialysis: Secondary | ICD-10-CM | POA: Diagnosis not present

## 2019-04-23 DIAGNOSIS — T8612 Kidney transplant failure: Secondary | ICD-10-CM | POA: Diagnosis not present

## 2019-04-23 DIAGNOSIS — N186 End stage renal disease: Secondary | ICD-10-CM | POA: Diagnosis not present

## 2019-04-24 DIAGNOSIS — D631 Anemia in chronic kidney disease: Secondary | ICD-10-CM | POA: Diagnosis not present

## 2019-04-24 DIAGNOSIS — E039 Hypothyroidism, unspecified: Secondary | ICD-10-CM | POA: Diagnosis not present

## 2019-04-24 DIAGNOSIS — N186 End stage renal disease: Secondary | ICD-10-CM | POA: Diagnosis not present

## 2019-04-24 DIAGNOSIS — D509 Iron deficiency anemia, unspecified: Secondary | ICD-10-CM | POA: Diagnosis not present

## 2019-04-24 DIAGNOSIS — Z992 Dependence on renal dialysis: Secondary | ICD-10-CM | POA: Diagnosis not present

## 2019-04-24 DIAGNOSIS — E7849 Other hyperlipidemia: Secondary | ICD-10-CM | POA: Diagnosis not present

## 2019-04-24 DIAGNOSIS — D689 Coagulation defect, unspecified: Secondary | ICD-10-CM | POA: Diagnosis not present

## 2019-04-24 DIAGNOSIS — N2581 Secondary hyperparathyroidism of renal origin: Secondary | ICD-10-CM | POA: Diagnosis not present

## 2019-04-27 DIAGNOSIS — D689 Coagulation defect, unspecified: Secondary | ICD-10-CM | POA: Diagnosis not present

## 2019-04-27 DIAGNOSIS — Z992 Dependence on renal dialysis: Secondary | ICD-10-CM | POA: Diagnosis not present

## 2019-04-27 DIAGNOSIS — E7849 Other hyperlipidemia: Secondary | ICD-10-CM | POA: Diagnosis not present

## 2019-04-27 DIAGNOSIS — N186 End stage renal disease: Secondary | ICD-10-CM | POA: Diagnosis not present

## 2019-04-27 DIAGNOSIS — D509 Iron deficiency anemia, unspecified: Secondary | ICD-10-CM | POA: Diagnosis not present

## 2019-04-27 DIAGNOSIS — D631 Anemia in chronic kidney disease: Secondary | ICD-10-CM | POA: Diagnosis not present

## 2019-04-27 DIAGNOSIS — N2581 Secondary hyperparathyroidism of renal origin: Secondary | ICD-10-CM | POA: Diagnosis not present

## 2019-04-27 DIAGNOSIS — E039 Hypothyroidism, unspecified: Secondary | ICD-10-CM | POA: Diagnosis not present

## 2019-04-29 DIAGNOSIS — Z992 Dependence on renal dialysis: Secondary | ICD-10-CM | POA: Diagnosis not present

## 2019-04-29 DIAGNOSIS — E7849 Other hyperlipidemia: Secondary | ICD-10-CM | POA: Diagnosis not present

## 2019-04-29 DIAGNOSIS — N186 End stage renal disease: Secondary | ICD-10-CM | POA: Diagnosis not present

## 2019-04-29 DIAGNOSIS — D689 Coagulation defect, unspecified: Secondary | ICD-10-CM | POA: Diagnosis not present

## 2019-04-29 DIAGNOSIS — E039 Hypothyroidism, unspecified: Secondary | ICD-10-CM | POA: Diagnosis not present

## 2019-04-29 DIAGNOSIS — N2581 Secondary hyperparathyroidism of renal origin: Secondary | ICD-10-CM | POA: Diagnosis not present

## 2019-04-29 DIAGNOSIS — D509 Iron deficiency anemia, unspecified: Secondary | ICD-10-CM | POA: Diagnosis not present

## 2019-04-29 DIAGNOSIS — D631 Anemia in chronic kidney disease: Secondary | ICD-10-CM | POA: Diagnosis not present

## 2019-05-01 DIAGNOSIS — D631 Anemia in chronic kidney disease: Secondary | ICD-10-CM | POA: Diagnosis not present

## 2019-05-01 DIAGNOSIS — Z992 Dependence on renal dialysis: Secondary | ICD-10-CM | POA: Diagnosis not present

## 2019-05-01 DIAGNOSIS — D509 Iron deficiency anemia, unspecified: Secondary | ICD-10-CM | POA: Diagnosis not present

## 2019-05-01 DIAGNOSIS — N186 End stage renal disease: Secondary | ICD-10-CM | POA: Diagnosis not present

## 2019-05-01 DIAGNOSIS — D689 Coagulation defect, unspecified: Secondary | ICD-10-CM | POA: Diagnosis not present

## 2019-05-01 DIAGNOSIS — E7849 Other hyperlipidemia: Secondary | ICD-10-CM | POA: Diagnosis not present

## 2019-05-01 DIAGNOSIS — N2581 Secondary hyperparathyroidism of renal origin: Secondary | ICD-10-CM | POA: Diagnosis not present

## 2019-05-01 DIAGNOSIS — E039 Hypothyroidism, unspecified: Secondary | ICD-10-CM | POA: Diagnosis not present

## 2019-05-04 DIAGNOSIS — E039 Hypothyroidism, unspecified: Secondary | ICD-10-CM | POA: Diagnosis not present

## 2019-05-04 DIAGNOSIS — Z992 Dependence on renal dialysis: Secondary | ICD-10-CM | POA: Diagnosis not present

## 2019-05-04 DIAGNOSIS — E7849 Other hyperlipidemia: Secondary | ICD-10-CM | POA: Diagnosis not present

## 2019-05-04 DIAGNOSIS — D509 Iron deficiency anemia, unspecified: Secondary | ICD-10-CM | POA: Diagnosis not present

## 2019-05-04 DIAGNOSIS — N2581 Secondary hyperparathyroidism of renal origin: Secondary | ICD-10-CM | POA: Diagnosis not present

## 2019-05-04 DIAGNOSIS — D631 Anemia in chronic kidney disease: Secondary | ICD-10-CM | POA: Diagnosis not present

## 2019-05-04 DIAGNOSIS — D689 Coagulation defect, unspecified: Secondary | ICD-10-CM | POA: Diagnosis not present

## 2019-05-04 DIAGNOSIS — N186 End stage renal disease: Secondary | ICD-10-CM | POA: Diagnosis not present

## 2019-05-05 DIAGNOSIS — M2550 Pain in unspecified joint: Secondary | ICD-10-CM | POA: Diagnosis not present

## 2019-05-05 DIAGNOSIS — M0579 Rheumatoid arthritis with rheumatoid factor of multiple sites without organ or systems involvement: Secondary | ICD-10-CM | POA: Diagnosis not present

## 2019-05-05 DIAGNOSIS — R918 Other nonspecific abnormal finding of lung field: Secondary | ICD-10-CM | POA: Diagnosis not present

## 2019-05-05 DIAGNOSIS — N186 End stage renal disease: Secondary | ICD-10-CM | POA: Diagnosis not present

## 2019-05-06 DIAGNOSIS — N2581 Secondary hyperparathyroidism of renal origin: Secondary | ICD-10-CM | POA: Diagnosis not present

## 2019-05-06 DIAGNOSIS — D509 Iron deficiency anemia, unspecified: Secondary | ICD-10-CM | POA: Diagnosis not present

## 2019-05-06 DIAGNOSIS — E039 Hypothyroidism, unspecified: Secondary | ICD-10-CM | POA: Diagnosis not present

## 2019-05-06 DIAGNOSIS — D631 Anemia in chronic kidney disease: Secondary | ICD-10-CM | POA: Diagnosis not present

## 2019-05-06 DIAGNOSIS — Z992 Dependence on renal dialysis: Secondary | ICD-10-CM | POA: Diagnosis not present

## 2019-05-06 DIAGNOSIS — E7849 Other hyperlipidemia: Secondary | ICD-10-CM | POA: Diagnosis not present

## 2019-05-06 DIAGNOSIS — D689 Coagulation defect, unspecified: Secondary | ICD-10-CM | POA: Diagnosis not present

## 2019-05-06 DIAGNOSIS — N186 End stage renal disease: Secondary | ICD-10-CM | POA: Diagnosis not present

## 2019-05-08 DIAGNOSIS — D631 Anemia in chronic kidney disease: Secondary | ICD-10-CM | POA: Diagnosis not present

## 2019-05-08 DIAGNOSIS — D509 Iron deficiency anemia, unspecified: Secondary | ICD-10-CM | POA: Diagnosis not present

## 2019-05-08 DIAGNOSIS — E039 Hypothyroidism, unspecified: Secondary | ICD-10-CM | POA: Diagnosis not present

## 2019-05-08 DIAGNOSIS — D689 Coagulation defect, unspecified: Secondary | ICD-10-CM | POA: Diagnosis not present

## 2019-05-08 DIAGNOSIS — Z992 Dependence on renal dialysis: Secondary | ICD-10-CM | POA: Diagnosis not present

## 2019-05-08 DIAGNOSIS — N186 End stage renal disease: Secondary | ICD-10-CM | POA: Diagnosis not present

## 2019-05-08 DIAGNOSIS — E7849 Other hyperlipidemia: Secondary | ICD-10-CM | POA: Diagnosis not present

## 2019-05-08 DIAGNOSIS — N2581 Secondary hyperparathyroidism of renal origin: Secondary | ICD-10-CM | POA: Diagnosis not present

## 2019-05-11 DIAGNOSIS — D509 Iron deficiency anemia, unspecified: Secondary | ICD-10-CM | POA: Diagnosis not present

## 2019-05-11 DIAGNOSIS — E039 Hypothyroidism, unspecified: Secondary | ICD-10-CM | POA: Diagnosis not present

## 2019-05-11 DIAGNOSIS — E7849 Other hyperlipidemia: Secondary | ICD-10-CM | POA: Diagnosis not present

## 2019-05-11 DIAGNOSIS — D631 Anemia in chronic kidney disease: Secondary | ICD-10-CM | POA: Diagnosis not present

## 2019-05-11 DIAGNOSIS — N186 End stage renal disease: Secondary | ICD-10-CM | POA: Diagnosis not present

## 2019-05-11 DIAGNOSIS — Z992 Dependence on renal dialysis: Secondary | ICD-10-CM | POA: Diagnosis not present

## 2019-05-11 DIAGNOSIS — N2581 Secondary hyperparathyroidism of renal origin: Secondary | ICD-10-CM | POA: Diagnosis not present

## 2019-05-11 DIAGNOSIS — D689 Coagulation defect, unspecified: Secondary | ICD-10-CM | POA: Diagnosis not present

## 2019-05-13 DIAGNOSIS — D509 Iron deficiency anemia, unspecified: Secondary | ICD-10-CM | POA: Diagnosis not present

## 2019-05-13 DIAGNOSIS — D631 Anemia in chronic kidney disease: Secondary | ICD-10-CM | POA: Diagnosis not present

## 2019-05-13 DIAGNOSIS — Z992 Dependence on renal dialysis: Secondary | ICD-10-CM | POA: Diagnosis not present

## 2019-05-13 DIAGNOSIS — N2581 Secondary hyperparathyroidism of renal origin: Secondary | ICD-10-CM | POA: Diagnosis not present

## 2019-05-13 DIAGNOSIS — N186 End stage renal disease: Secondary | ICD-10-CM | POA: Diagnosis not present

## 2019-05-13 DIAGNOSIS — D689 Coagulation defect, unspecified: Secondary | ICD-10-CM | POA: Diagnosis not present

## 2019-05-13 DIAGNOSIS — E039 Hypothyroidism, unspecified: Secondary | ICD-10-CM | POA: Diagnosis not present

## 2019-05-13 DIAGNOSIS — E7849 Other hyperlipidemia: Secondary | ICD-10-CM | POA: Diagnosis not present

## 2019-05-14 DIAGNOSIS — R9389 Abnormal findings on diagnostic imaging of other specified body structures: Secondary | ICD-10-CM | POA: Diagnosis not present

## 2019-05-14 DIAGNOSIS — J45909 Unspecified asthma, uncomplicated: Secondary | ICD-10-CM | POA: Diagnosis not present

## 2019-05-15 DIAGNOSIS — N186 End stage renal disease: Secondary | ICD-10-CM | POA: Diagnosis not present

## 2019-05-15 DIAGNOSIS — D631 Anemia in chronic kidney disease: Secondary | ICD-10-CM | POA: Diagnosis not present

## 2019-05-15 DIAGNOSIS — E7849 Other hyperlipidemia: Secondary | ICD-10-CM | POA: Diagnosis not present

## 2019-05-15 DIAGNOSIS — D509 Iron deficiency anemia, unspecified: Secondary | ICD-10-CM | POA: Diagnosis not present

## 2019-05-15 DIAGNOSIS — Z992 Dependence on renal dialysis: Secondary | ICD-10-CM | POA: Diagnosis not present

## 2019-05-15 DIAGNOSIS — E039 Hypothyroidism, unspecified: Secondary | ICD-10-CM | POA: Diagnosis not present

## 2019-05-15 DIAGNOSIS — N2581 Secondary hyperparathyroidism of renal origin: Secondary | ICD-10-CM | POA: Diagnosis not present

## 2019-05-15 DIAGNOSIS — D689 Coagulation defect, unspecified: Secondary | ICD-10-CM | POA: Diagnosis not present

## 2019-05-17 DIAGNOSIS — Z0120 Encounter for dental examination and cleaning without abnormal findings: Secondary | ICD-10-CM | POA: Diagnosis not present

## 2019-05-18 DIAGNOSIS — E039 Hypothyroidism, unspecified: Secondary | ICD-10-CM | POA: Diagnosis not present

## 2019-05-18 DIAGNOSIS — D631 Anemia in chronic kidney disease: Secondary | ICD-10-CM | POA: Diagnosis not present

## 2019-05-18 DIAGNOSIS — N2581 Secondary hyperparathyroidism of renal origin: Secondary | ICD-10-CM | POA: Diagnosis not present

## 2019-05-18 DIAGNOSIS — N186 End stage renal disease: Secondary | ICD-10-CM | POA: Diagnosis not present

## 2019-05-18 DIAGNOSIS — Z992 Dependence on renal dialysis: Secondary | ICD-10-CM | POA: Diagnosis not present

## 2019-05-18 DIAGNOSIS — D689 Coagulation defect, unspecified: Secondary | ICD-10-CM | POA: Diagnosis not present

## 2019-05-18 DIAGNOSIS — D509 Iron deficiency anemia, unspecified: Secondary | ICD-10-CM | POA: Diagnosis not present

## 2019-05-18 DIAGNOSIS — E7849 Other hyperlipidemia: Secondary | ICD-10-CM | POA: Diagnosis not present

## 2019-05-20 DIAGNOSIS — N2581 Secondary hyperparathyroidism of renal origin: Secondary | ICD-10-CM | POA: Diagnosis not present

## 2019-05-20 DIAGNOSIS — D509 Iron deficiency anemia, unspecified: Secondary | ICD-10-CM | POA: Diagnosis not present

## 2019-05-20 DIAGNOSIS — Z992 Dependence on renal dialysis: Secondary | ICD-10-CM | POA: Diagnosis not present

## 2019-05-20 DIAGNOSIS — N186 End stage renal disease: Secondary | ICD-10-CM | POA: Diagnosis not present

## 2019-05-20 DIAGNOSIS — E7849 Other hyperlipidemia: Secondary | ICD-10-CM | POA: Diagnosis not present

## 2019-05-20 DIAGNOSIS — E039 Hypothyroidism, unspecified: Secondary | ICD-10-CM | POA: Diagnosis not present

## 2019-05-20 DIAGNOSIS — D631 Anemia in chronic kidney disease: Secondary | ICD-10-CM | POA: Diagnosis not present

## 2019-05-20 DIAGNOSIS — D689 Coagulation defect, unspecified: Secondary | ICD-10-CM | POA: Diagnosis not present

## 2019-05-20 IMAGING — US US CAROTID DUPLEX BILAT
1 series · 13 of 24 positions shown · non-contrast
Comparison: No prior duplex

CLINICAL DATA: 61-year-old female with a history of stroke.

Cardiovascular risk factors include stroke/TIA
EXAM:
BILATERAL CAROTID DUPLEX ULTRASOUND
TECHNIQUE: Gray scale imaging, color Doppler and duplex ultrasound were
performed of bilateral carotid and vertebral arteries in the neck.

[Series 1: us carotid duplex bilat · 0.06mm/px · 13 of 43 slices shown]
[im 1/43]
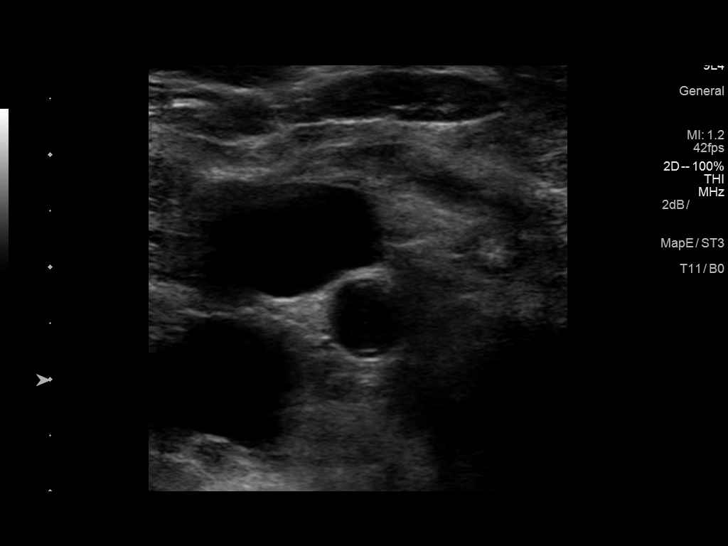
[im 4/43]
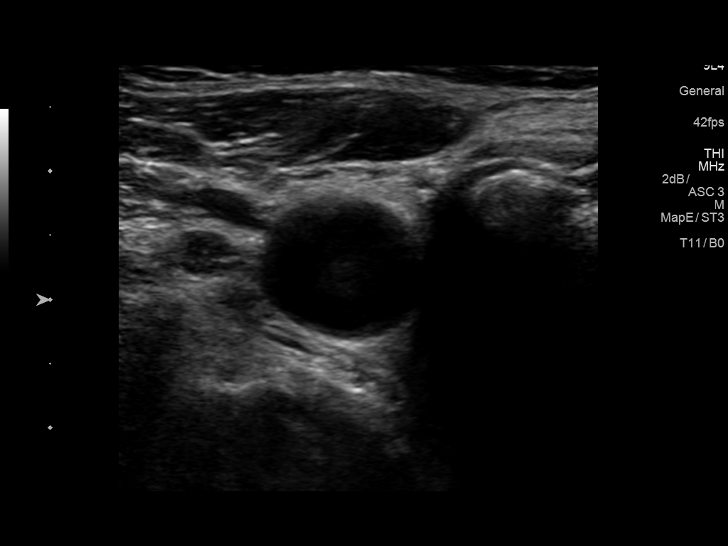
[im 8/43]
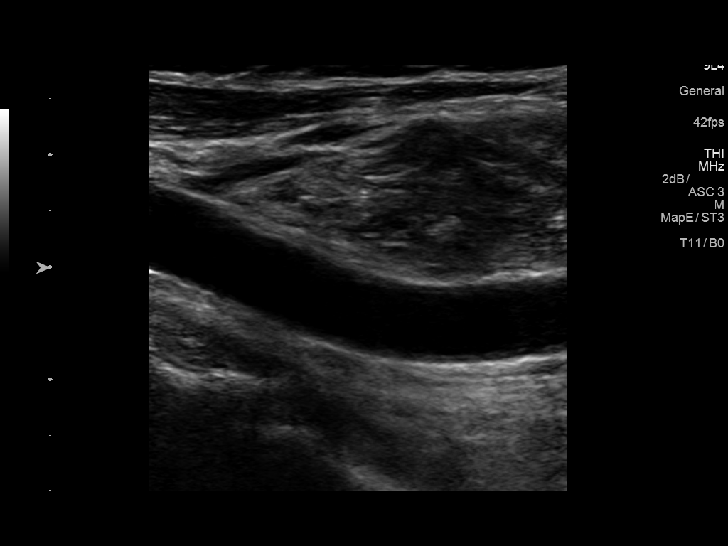
[im 11/43]
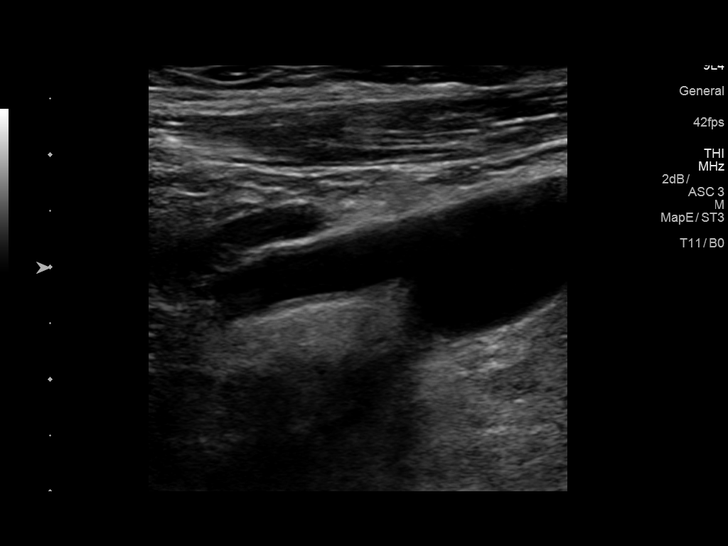
[im 15/43]
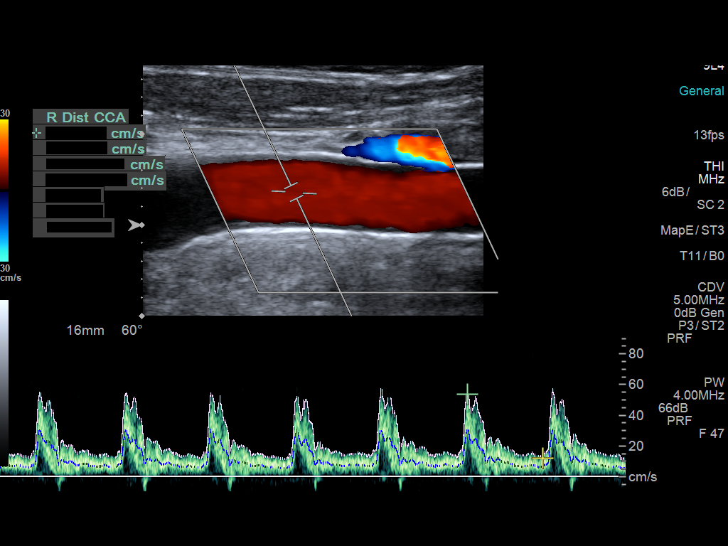
[im 19/43]
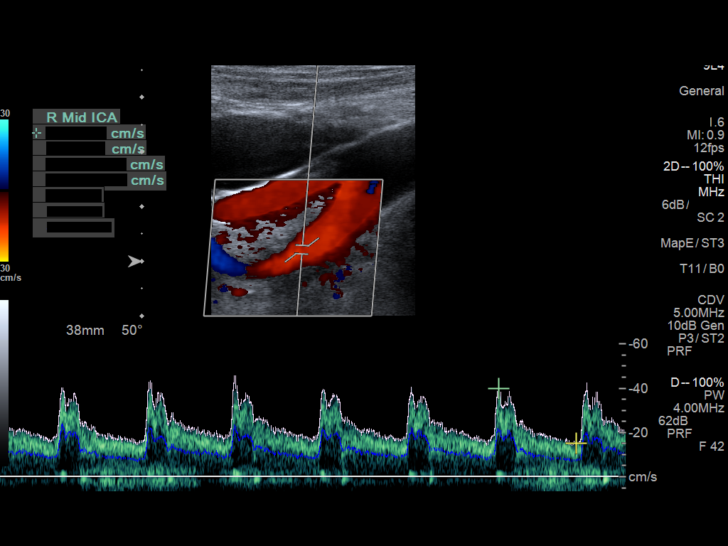
[im 22/43]
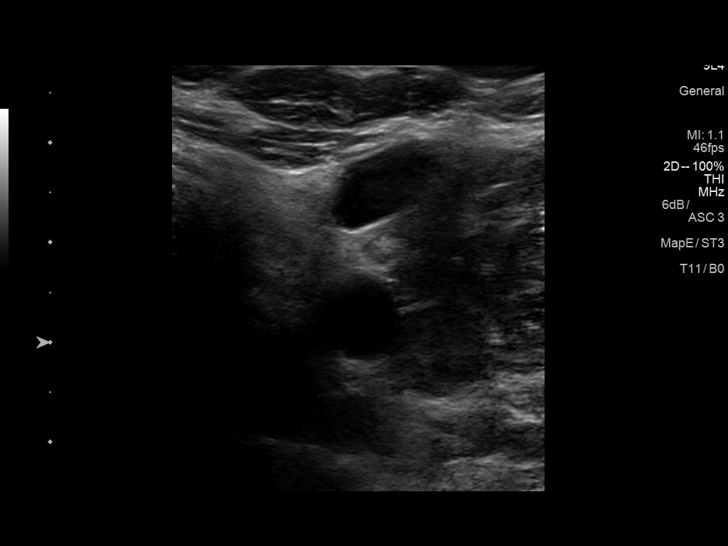
[im 24/43]
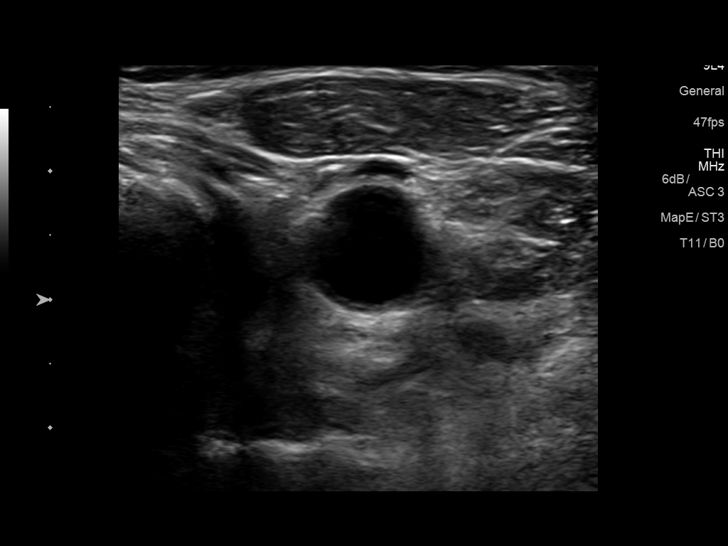
[im 28/43]
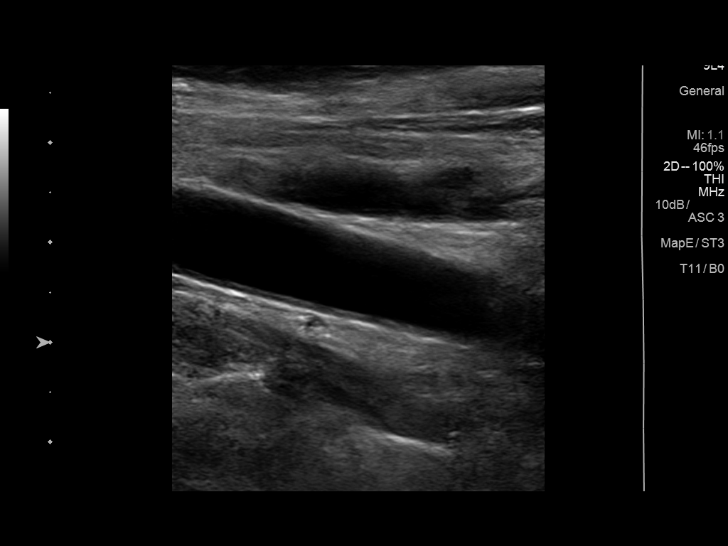
[im 32/43]
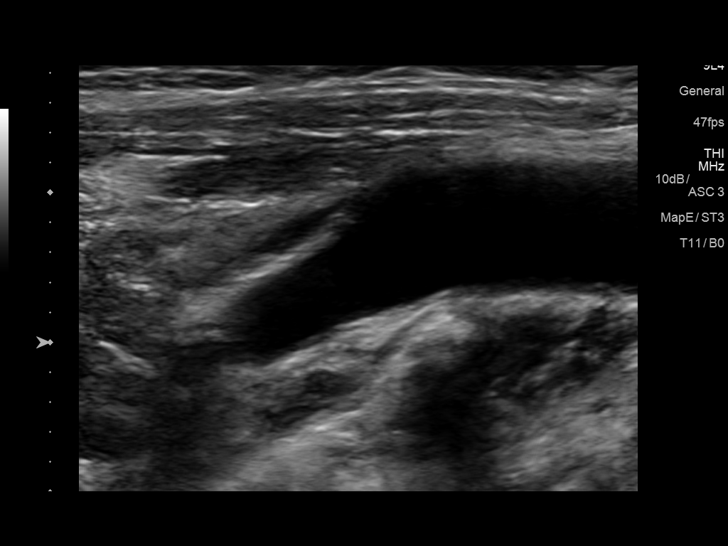
[im 35/43]
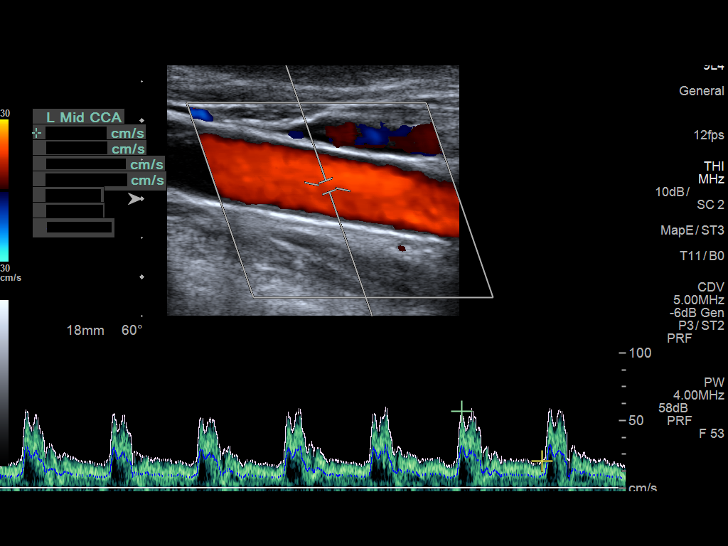
[im 39/43]
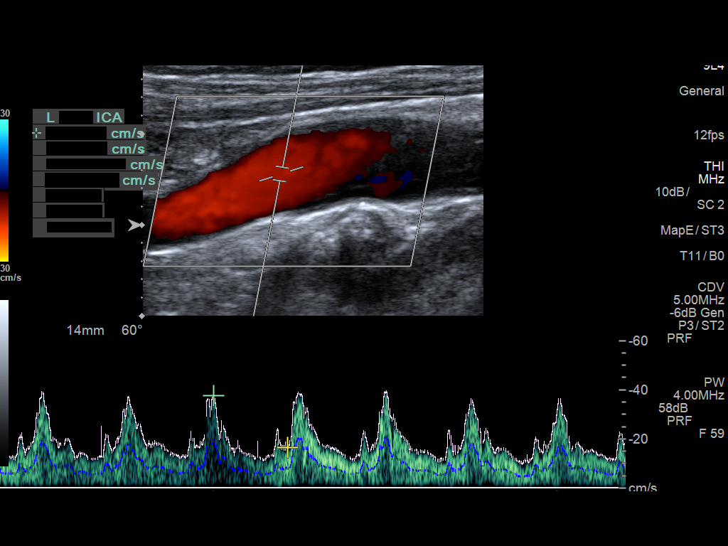
[im 43/43]
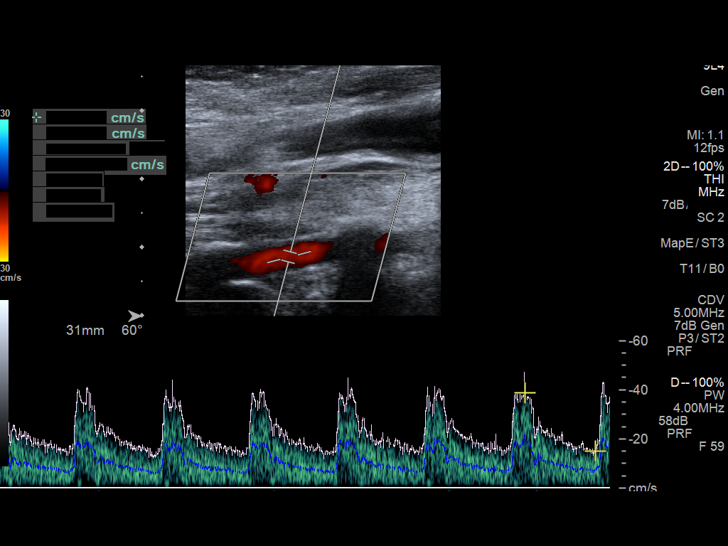

[13 of 24 positions shown; findings below may reference images not displayed]

FINDINGS: Criteria: Quantification of carotid stenosis is based on velocity
parameters that correlate the residual internal carotid diameter
with NASCET-based stenosis levels, using the diameter of the distal
internal carotid lumen as the denominator for stenosis measurement.

The following velocity measurements were obtained:

RIGHT

ICA:  Systolic 66 cm/sec, Diastolic 25 cm/sec

CCA:  84 cm/sec

SYSTOLIC ICA/CCA RATIO:

ECA:  36 cm/sec

LEFT

ICA:  Systolic 89 cm/sec, Diastolic 43 cm/sec

CCA:  71 cm/sec

SYSTOLIC ICA/CCA RATIO:

ECA:  45 cm/sec

Right Brachial SBP: 103

Left Brachial SBP: 99

RIGHT CAROTID ARTERY: No significant calcified disease of the right
common carotid artery. Intermediate waveform maintained.
Heterogeneous plaque without significant calcifications at the right
carotid bifurcation. Low resistance waveform of the right ICA.
Tortuosity.

RIGHT VERTEBRAL ARTERY: Antegrade flow with low resistance waveform.

LEFT CAROTID ARTERY: No significant calcified disease of the left
common carotid artery. Intermediate waveform maintained.
Heterogeneous plaque at the left carotid bifurcation without
significant calcifications. Low resistance waveform of the left ICA.
Tortuosity

LEFT VERTEBRAL ARTERY:  Antegrade flow with low resistance waveform.
IMPRESSION: Color duplex indicates minimal heterogeneous plaque, with no
hemodynamically significant stenosis by duplex criteria in the
extracranial cerebrovascular circulation.

## 2019-05-21 DIAGNOSIS — H5203 Hypermetropia, bilateral: Secondary | ICD-10-CM | POA: Diagnosis not present

## 2019-05-22 DIAGNOSIS — N186 End stage renal disease: Secondary | ICD-10-CM | POA: Diagnosis not present

## 2019-05-22 DIAGNOSIS — D631 Anemia in chronic kidney disease: Secondary | ICD-10-CM | POA: Diagnosis not present

## 2019-05-22 DIAGNOSIS — D509 Iron deficiency anemia, unspecified: Secondary | ICD-10-CM | POA: Diagnosis not present

## 2019-05-22 DIAGNOSIS — Z992 Dependence on renal dialysis: Secondary | ICD-10-CM | POA: Diagnosis not present

## 2019-05-22 DIAGNOSIS — D689 Coagulation defect, unspecified: Secondary | ICD-10-CM | POA: Diagnosis not present

## 2019-05-22 DIAGNOSIS — E039 Hypothyroidism, unspecified: Secondary | ICD-10-CM | POA: Diagnosis not present

## 2019-05-22 DIAGNOSIS — N2581 Secondary hyperparathyroidism of renal origin: Secondary | ICD-10-CM | POA: Diagnosis not present

## 2019-05-22 DIAGNOSIS — E7849 Other hyperlipidemia: Secondary | ICD-10-CM | POA: Diagnosis not present

## 2019-05-24 DIAGNOSIS — N186 End stage renal disease: Secondary | ICD-10-CM | POA: Diagnosis not present

## 2019-05-24 DIAGNOSIS — Z992 Dependence on renal dialysis: Secondary | ICD-10-CM | POA: Diagnosis not present

## 2019-05-24 DIAGNOSIS — T8612 Kidney transplant failure: Secondary | ICD-10-CM | POA: Diagnosis not present

## 2019-05-25 DIAGNOSIS — D689 Coagulation defect, unspecified: Secondary | ICD-10-CM | POA: Diagnosis not present

## 2019-05-25 DIAGNOSIS — Z992 Dependence on renal dialysis: Secondary | ICD-10-CM | POA: Diagnosis not present

## 2019-05-25 DIAGNOSIS — E039 Hypothyroidism, unspecified: Secondary | ICD-10-CM | POA: Diagnosis not present

## 2019-05-25 DIAGNOSIS — N2581 Secondary hyperparathyroidism of renal origin: Secondary | ICD-10-CM | POA: Diagnosis not present

## 2019-05-25 DIAGNOSIS — D631 Anemia in chronic kidney disease: Secondary | ICD-10-CM | POA: Diagnosis not present

## 2019-05-25 DIAGNOSIS — N186 End stage renal disease: Secondary | ICD-10-CM | POA: Diagnosis not present

## 2019-05-27 DIAGNOSIS — D631 Anemia in chronic kidney disease: Secondary | ICD-10-CM | POA: Diagnosis not present

## 2019-05-27 DIAGNOSIS — Z992 Dependence on renal dialysis: Secondary | ICD-10-CM | POA: Diagnosis not present

## 2019-05-27 DIAGNOSIS — D689 Coagulation defect, unspecified: Secondary | ICD-10-CM | POA: Diagnosis not present

## 2019-05-27 DIAGNOSIS — N2581 Secondary hyperparathyroidism of renal origin: Secondary | ICD-10-CM | POA: Diagnosis not present

## 2019-05-27 DIAGNOSIS — E039 Hypothyroidism, unspecified: Secondary | ICD-10-CM | POA: Diagnosis not present

## 2019-05-27 DIAGNOSIS — N186 End stage renal disease: Secondary | ICD-10-CM | POA: Diagnosis not present

## 2019-05-29 DIAGNOSIS — N2581 Secondary hyperparathyroidism of renal origin: Secondary | ICD-10-CM | POA: Diagnosis not present

## 2019-05-29 DIAGNOSIS — N186 End stage renal disease: Secondary | ICD-10-CM | POA: Diagnosis not present

## 2019-05-29 DIAGNOSIS — D631 Anemia in chronic kidney disease: Secondary | ICD-10-CM | POA: Diagnosis not present

## 2019-05-29 DIAGNOSIS — Z992 Dependence on renal dialysis: Secondary | ICD-10-CM | POA: Diagnosis not present

## 2019-05-29 DIAGNOSIS — E039 Hypothyroidism, unspecified: Secondary | ICD-10-CM | POA: Diagnosis not present

## 2019-05-29 DIAGNOSIS — D689 Coagulation defect, unspecified: Secondary | ICD-10-CM | POA: Diagnosis not present

## 2019-06-01 DIAGNOSIS — E039 Hypothyroidism, unspecified: Secondary | ICD-10-CM | POA: Diagnosis not present

## 2019-06-01 DIAGNOSIS — D689 Coagulation defect, unspecified: Secondary | ICD-10-CM | POA: Diagnosis not present

## 2019-06-01 DIAGNOSIS — D631 Anemia in chronic kidney disease: Secondary | ICD-10-CM | POA: Diagnosis not present

## 2019-06-01 DIAGNOSIS — N2581 Secondary hyperparathyroidism of renal origin: Secondary | ICD-10-CM | POA: Diagnosis not present

## 2019-06-01 DIAGNOSIS — N186 End stage renal disease: Secondary | ICD-10-CM | POA: Diagnosis not present

## 2019-06-01 DIAGNOSIS — Z992 Dependence on renal dialysis: Secondary | ICD-10-CM | POA: Diagnosis not present

## 2019-06-03 DIAGNOSIS — D689 Coagulation defect, unspecified: Secondary | ICD-10-CM | POA: Diagnosis not present

## 2019-06-03 DIAGNOSIS — Z992 Dependence on renal dialysis: Secondary | ICD-10-CM | POA: Diagnosis not present

## 2019-06-03 DIAGNOSIS — N2581 Secondary hyperparathyroidism of renal origin: Secondary | ICD-10-CM | POA: Diagnosis not present

## 2019-06-03 DIAGNOSIS — E039 Hypothyroidism, unspecified: Secondary | ICD-10-CM | POA: Diagnosis not present

## 2019-06-03 DIAGNOSIS — N186 End stage renal disease: Secondary | ICD-10-CM | POA: Diagnosis not present

## 2019-06-03 DIAGNOSIS — D631 Anemia in chronic kidney disease: Secondary | ICD-10-CM | POA: Diagnosis not present

## 2019-06-05 DIAGNOSIS — N2581 Secondary hyperparathyroidism of renal origin: Secondary | ICD-10-CM | POA: Diagnosis not present

## 2019-06-05 DIAGNOSIS — Z992 Dependence on renal dialysis: Secondary | ICD-10-CM | POA: Diagnosis not present

## 2019-06-05 DIAGNOSIS — N186 End stage renal disease: Secondary | ICD-10-CM | POA: Diagnosis not present

## 2019-06-05 DIAGNOSIS — E039 Hypothyroidism, unspecified: Secondary | ICD-10-CM | POA: Diagnosis not present

## 2019-06-05 DIAGNOSIS — D689 Coagulation defect, unspecified: Secondary | ICD-10-CM | POA: Diagnosis not present

## 2019-06-05 DIAGNOSIS — D631 Anemia in chronic kidney disease: Secondary | ICD-10-CM | POA: Diagnosis not present

## 2019-06-08 DIAGNOSIS — N2581 Secondary hyperparathyroidism of renal origin: Secondary | ICD-10-CM | POA: Diagnosis not present

## 2019-06-08 DIAGNOSIS — E039 Hypothyroidism, unspecified: Secondary | ICD-10-CM | POA: Diagnosis not present

## 2019-06-08 DIAGNOSIS — D689 Coagulation defect, unspecified: Secondary | ICD-10-CM | POA: Diagnosis not present

## 2019-06-08 DIAGNOSIS — D631 Anemia in chronic kidney disease: Secondary | ICD-10-CM | POA: Diagnosis not present

## 2019-06-08 DIAGNOSIS — N186 End stage renal disease: Secondary | ICD-10-CM | POA: Diagnosis not present

## 2019-06-08 DIAGNOSIS — Z992 Dependence on renal dialysis: Secondary | ICD-10-CM | POA: Diagnosis not present

## 2019-06-10 DIAGNOSIS — N2581 Secondary hyperparathyroidism of renal origin: Secondary | ICD-10-CM | POA: Diagnosis not present

## 2019-06-10 DIAGNOSIS — D689 Coagulation defect, unspecified: Secondary | ICD-10-CM | POA: Diagnosis not present

## 2019-06-10 DIAGNOSIS — E039 Hypothyroidism, unspecified: Secondary | ICD-10-CM | POA: Diagnosis not present

## 2019-06-10 DIAGNOSIS — D631 Anemia in chronic kidney disease: Secondary | ICD-10-CM | POA: Diagnosis not present

## 2019-06-10 DIAGNOSIS — Z992 Dependence on renal dialysis: Secondary | ICD-10-CM | POA: Diagnosis not present

## 2019-06-10 DIAGNOSIS — N186 End stage renal disease: Secondary | ICD-10-CM | POA: Diagnosis not present

## 2019-06-12 DIAGNOSIS — D689 Coagulation defect, unspecified: Secondary | ICD-10-CM | POA: Diagnosis not present

## 2019-06-12 DIAGNOSIS — N2581 Secondary hyperparathyroidism of renal origin: Secondary | ICD-10-CM | POA: Diagnosis not present

## 2019-06-12 DIAGNOSIS — E039 Hypothyroidism, unspecified: Secondary | ICD-10-CM | POA: Diagnosis not present

## 2019-06-12 DIAGNOSIS — N186 End stage renal disease: Secondary | ICD-10-CM | POA: Diagnosis not present

## 2019-06-12 DIAGNOSIS — D631 Anemia in chronic kidney disease: Secondary | ICD-10-CM | POA: Diagnosis not present

## 2019-06-12 DIAGNOSIS — Z992 Dependence on renal dialysis: Secondary | ICD-10-CM | POA: Diagnosis not present

## 2019-06-15 DIAGNOSIS — N2581 Secondary hyperparathyroidism of renal origin: Secondary | ICD-10-CM | POA: Diagnosis not present

## 2019-06-15 DIAGNOSIS — D689 Coagulation defect, unspecified: Secondary | ICD-10-CM | POA: Diagnosis not present

## 2019-06-15 DIAGNOSIS — D631 Anemia in chronic kidney disease: Secondary | ICD-10-CM | POA: Diagnosis not present

## 2019-06-15 DIAGNOSIS — Z992 Dependence on renal dialysis: Secondary | ICD-10-CM | POA: Diagnosis not present

## 2019-06-15 DIAGNOSIS — N186 End stage renal disease: Secondary | ICD-10-CM | POA: Diagnosis not present

## 2019-06-15 DIAGNOSIS — E039 Hypothyroidism, unspecified: Secondary | ICD-10-CM | POA: Diagnosis not present

## 2019-06-17 DIAGNOSIS — Z992 Dependence on renal dialysis: Secondary | ICD-10-CM | POA: Diagnosis not present

## 2019-06-17 DIAGNOSIS — D689 Coagulation defect, unspecified: Secondary | ICD-10-CM | POA: Diagnosis not present

## 2019-06-17 DIAGNOSIS — E039 Hypothyroidism, unspecified: Secondary | ICD-10-CM | POA: Diagnosis not present

## 2019-06-17 DIAGNOSIS — N2581 Secondary hyperparathyroidism of renal origin: Secondary | ICD-10-CM | POA: Diagnosis not present

## 2019-06-17 DIAGNOSIS — N186 End stage renal disease: Secondary | ICD-10-CM | POA: Diagnosis not present

## 2019-06-17 DIAGNOSIS — D631 Anemia in chronic kidney disease: Secondary | ICD-10-CM | POA: Diagnosis not present

## 2019-06-19 ENCOUNTER — Emergency Department (HOSPITAL_COMMUNITY): Payer: Medicare Other

## 2019-06-19 ENCOUNTER — Other Ambulatory Visit: Payer: Self-pay

## 2019-06-19 ENCOUNTER — Observation Stay (HOSPITAL_COMMUNITY)
Admission: EM | Admit: 2019-06-19 | Discharge: 2019-06-20 | Disposition: A | Discharge status: Home / Self Care | Payer: Medicare Other | Attending: Family Medicine | Admitting: Family Medicine

## 2019-06-19 ENCOUNTER — Encounter (HOSPITAL_COMMUNITY): Payer: Self-pay | Admitting: *Deleted

## 2019-06-19 ENCOUNTER — Emergency Department (HOSPITAL_BASED_OUTPATIENT_CLINIC_OR_DEPARTMENT_OTHER): Payer: Medicare Other

## 2019-06-19 DIAGNOSIS — Z79899 Other long term (current) drug therapy: Secondary | ICD-10-CM | POA: Diagnosis not present

## 2019-06-19 DIAGNOSIS — M7989 Other specified soft tissue disorders: Secondary | ICD-10-CM

## 2019-06-19 DIAGNOSIS — J45909 Unspecified asthma, uncomplicated: Secondary | ICD-10-CM | POA: Diagnosis not present

## 2019-06-19 DIAGNOSIS — Z7982 Long term (current) use of aspirin: Secondary | ICD-10-CM | POA: Insufficient documentation

## 2019-06-19 DIAGNOSIS — R053 Chronic cough: Secondary | ICD-10-CM | POA: Diagnosis present

## 2019-06-19 DIAGNOSIS — Z992 Dependence on renal dialysis: Secondary | ICD-10-CM | POA: Insufficient documentation

## 2019-06-19 DIAGNOSIS — R091 Pleurisy: Secondary | ICD-10-CM | POA: Diagnosis not present

## 2019-06-19 DIAGNOSIS — R0789 Other chest pain: Principal | ICD-10-CM | POA: Insufficient documentation

## 2019-06-19 DIAGNOSIS — Z8673 Personal history of transient ischemic attack (TIA), and cerebral infarction without residual deficits: Secondary | ICD-10-CM | POA: Insufficient documentation

## 2019-06-19 DIAGNOSIS — E039 Hypothyroidism, unspecified: Secondary | ICD-10-CM | POA: Diagnosis present

## 2019-06-19 DIAGNOSIS — E119 Type 2 diabetes mellitus without complications: Secondary | ICD-10-CM | POA: Diagnosis not present

## 2019-06-19 DIAGNOSIS — N186 End stage renal disease: Secondary | ICD-10-CM | POA: Diagnosis not present

## 2019-06-19 DIAGNOSIS — I10 Essential (primary) hypertension: Secondary | ICD-10-CM | POA: Diagnosis present

## 2019-06-19 DIAGNOSIS — I120 Hypertensive chronic kidney disease with stage 5 chronic kidney disease or end stage renal disease: Secondary | ICD-10-CM | POA: Diagnosis not present

## 2019-06-19 DIAGNOSIS — Z20828 Contact with and (suspected) exposure to other viral communicable diseases: Secondary | ICD-10-CM | POA: Insufficient documentation

## 2019-06-19 DIAGNOSIS — R0602 Shortness of breath: Secondary | ICD-10-CM | POA: Diagnosis not present

## 2019-06-19 DIAGNOSIS — R079 Chest pain, unspecified: Secondary | ICD-10-CM | POA: Diagnosis present

## 2019-06-19 DIAGNOSIS — E118 Type 2 diabetes mellitus with unspecified complications: Secondary | ICD-10-CM | POA: Diagnosis present

## 2019-06-19 DIAGNOSIS — R05 Cough: Secondary | ICD-10-CM | POA: Diagnosis present

## 2019-06-19 LAB — BASIC METABOLIC PANEL
Anion gap: 13 (ref 5–15)
BUN: 28 mg/dL — ABNORMAL HIGH (ref 8–23)
CO2: 28 mmol/L (ref 22–32)
Calcium: 9.8 mg/dL (ref 8.9–10.3)
Chloride: 94 mmol/L — ABNORMAL LOW (ref 98–111)
Creatinine, Ser: 5.64 mg/dL — ABNORMAL HIGH (ref 0.44–1.00)
GFR calc Af Amer: 9 mL/min — ABNORMAL LOW (ref >60)
GFR calc non Af Amer: 7 mL/min — ABNORMAL LOW (ref >60)
Glucose, Bld: 86 mg/dL (ref 70–99)
Potassium: 3.3 mmol/L — ABNORMAL LOW (ref 3.5–5.1)
Sodium: 135 mmol/L (ref 135–145)

## 2019-06-19 LAB — D-DIMER, QUANTITATIVE: D-Dimer, Quant: 0.92 ug/mL-FEU — ABNORMAL HIGH (ref 0.00–0.50)

## 2019-06-19 LAB — CBC
HCT: 30.9 % — ABNORMAL LOW (ref 36.0–46.0)
Hemoglobin: 9.7 g/dL — ABNORMAL LOW (ref 12.0–15.0)
MCH: 30.3 pg (ref 26.0–34.0)
MCHC: 31.4 g/dL (ref 30.0–36.0)
MCV: 96.6 fL (ref 80.0–100.0)
Platelets: 264 10*3/uL (ref 150–400)
RBC: 3.2 MIL/uL — ABNORMAL LOW (ref 3.87–5.11)
RDW: 15.2 % (ref 11.5–15.5)
WBC: 4 10*3/uL (ref 4.0–10.5)
nRBC: 0 % (ref 0.0–0.2)

## 2019-06-19 LAB — TROPONIN I (HIGH SENSITIVITY)
Troponin I (High Sensitivity): 16 ng/L (ref <18)
Troponin I (High Sensitivity): 15 ng/L (ref <18)

## 2019-06-19 LAB — TSH: TSH: 2.365 u[IU]/mL (ref 0.350–4.500)

## 2019-06-19 LAB — SARS CORONAVIRUS 2 BY RT PCR (HOSPITAL ORDER, PERFORMED IN ~~LOC~~ HOSPITAL LAB): SARS Coronavirus 2: NEGATIVE

## 2019-06-19 MED ORDER — CALCIUM CARBONATE ANTACID 1250 MG/5ML PO SUSP
500.0000 mg | Freq: Four times a day (QID) | ORAL | Status: DC | PRN
  Filled 2019-06-19: qty 5

## 2019-06-19 MED ORDER — LANTHANUM CARBONATE 500 MG PO CHEW
250.0000 mg | CHEWABLE_TABLET | Freq: Three times a day (TID) | ORAL | Status: DC
  Filled 2019-06-19 (×2): qty 1

## 2019-06-19 MED ORDER — CALCIUM ACETATE (PHOS BINDER) 667 MG PO CAPS
667.0000 mg | ORAL_CAPSULE | Freq: Three times a day (TID) | ORAL | Status: DC
  Administered 2019-06-20: 667 mg via ORAL
  Filled 2019-06-19 (×2): qty 1

## 2019-06-19 MED ORDER — LEVOTHYROXINE SODIUM 50 MCG PO TABS: 50.0000 ug | ORAL_TABLET | ORAL | Status: DC

## 2019-06-19 MED ORDER — NEPRO/CARBSTEADY PO LIQD: 237.0000 mL | Freq: Three times a day (TID) | ORAL | Status: DC | PRN

## 2019-06-19 MED ORDER — HYDROMORPHONE HCL 1 MG/ML IJ SOLN
1.0000 mg | Freq: Once | INTRAMUSCULAR | Status: AC
  Administered 2019-06-19: 1 mg via INTRAVENOUS
  Filled 2019-06-19: qty 1

## 2019-06-19 MED ORDER — ACETAMINOPHEN 325 MG PO TABS
650.0000 mg | ORAL_TABLET | Freq: Four times a day (QID) | ORAL | Status: DC | PRN
  Filled 2019-06-19: qty 2

## 2019-06-19 MED ORDER — HYDROCODONE-ACETAMINOPHEN 5-325 MG PO TABS: 1.0000 | ORAL_TABLET | ORAL | Status: DC | PRN

## 2019-06-19 MED ORDER — TEMAZEPAM 15 MG PO CAPS
15.0000 mg | ORAL_CAPSULE | Freq: Every evening | ORAL | Status: DC | PRN
  Administered 2019-06-19: 15 mg via ORAL
  Filled 2019-06-19: qty 1

## 2019-06-19 MED ORDER — LEVOTHYROXINE SODIUM 75 MCG PO TABS: 75.0000 ug | ORAL_TABLET | ORAL | Status: DC

## 2019-06-19 MED ORDER — CAMPHOR-MENTHOL 0.5-0.5 % EX LOTN
1.0000 "application " | TOPICAL_LOTION | Freq: Three times a day (TID) | CUTANEOUS | Status: DC | PRN
  Filled 2019-06-19: qty 222

## 2019-06-19 MED ORDER — HEPARIN SODIUM (PORCINE) 5000 UNIT/ML IJ SOLN
5000.0000 [IU] | Freq: Three times a day (TID) | INTRAMUSCULAR | Status: DC
  Filled 2019-06-19: qty 1

## 2019-06-19 MED ORDER — SORBITOL 70 % SOLN: 30.0000 mL | Status: DC | PRN

## 2019-06-19 MED ORDER — HYDROXYZINE HCL 25 MG PO TABS: 25.0000 mg | ORAL_TABLET | Freq: Three times a day (TID) | ORAL | Status: DC | PRN

## 2019-06-19 MED ORDER — CINACALCET HCL 30 MG PO TABS
30.0000 mg | ORAL_TABLET | Freq: Every day | ORAL | Status: DC
  Filled 2019-06-19 (×2): qty 1

## 2019-06-19 MED ORDER — AMLODIPINE BESYLATE 5 MG PO TABS
5.0000 mg | ORAL_TABLET | Freq: Every day | ORAL | Status: DC
  Administered 2019-06-19: 5 mg via ORAL
  Filled 2019-06-19: qty 1

## 2019-06-19 MED ORDER — CALCITRIOL 0.5 MCG PO CAPS
0.7500 ug | ORAL_CAPSULE | ORAL | Status: DC
  Administered 2019-06-20: 0.75 ug via ORAL
  Filled 2019-06-19 (×2): qty 1

## 2019-06-19 MED ORDER — ONDANSETRON HCL 4 MG/2ML IJ SOLN: 4.0000 mg | Freq: Four times a day (QID) | INTRAMUSCULAR | Status: DC | PRN

## 2019-06-19 MED ORDER — ACETAMINOPHEN 650 MG RE SUPP: 650.0000 mg | Freq: Four times a day (QID) | RECTAL | Status: DC | PRN

## 2019-06-19 MED ORDER — SODIUM CHLORIDE 0.9% FLUSH: 3.0000 mL | Freq: Once | INTRAVENOUS | Status: DC

## 2019-06-19 MED ORDER — HYDROXYCHLOROQUINE SULFATE 200 MG PO TABS
200.0000 mg | ORAL_TABLET | Freq: Every day | ORAL | Status: DC
  Administered 2019-06-20: 200 mg via ORAL
  Filled 2019-06-19: qty 1

## 2019-06-19 MED ORDER — CHLORHEXIDINE GLUCONATE CLOTH 2 % EX PADS
6.0000 | MEDICATED_PAD | Freq: Every day | CUTANEOUS | Status: DC
  Administered 2019-06-20: 6 via TOPICAL

## 2019-06-19 MED ORDER — ONDANSETRON HCL 4 MG PO TABS: 4.0000 mg | ORAL_TABLET | Freq: Four times a day (QID) | ORAL | Status: DC | PRN

## 2019-06-19 MED ORDER — UMECLIDINIUM BROMIDE 62.5 MCG/INH IN AEPB
1.0000 | INHALATION_SPRAY | Freq: Every day | RESPIRATORY_TRACT | Status: DC
  Administered 2019-06-20: 1 via RESPIRATORY_TRACT
  Filled 2019-06-19: qty 7

## 2019-06-19 MED ORDER — FLUTICASONE FUROATE-VILANTEROL 200-25 MCG/INH IN AEPB
1.0000 | INHALATION_SPRAY | Freq: Every evening | RESPIRATORY_TRACT | Status: DC
  Filled 2019-06-19: qty 28

## 2019-06-19 MED ORDER — CALCIUM ACETATE (PHOS BINDER) 667 MG PO CAPS
1334.0000 mg | ORAL_CAPSULE | Freq: Three times a day (TID) | ORAL | Status: DC
  Filled 2019-06-19 (×2): qty 2

## 2019-06-19 MED ORDER — TIOTROPIUM BROMIDE MONOHYDRATE 1.25 MCG/ACT IN AERS: 2.0000 | INHALATION_SPRAY | Freq: Every day | RESPIRATORY_TRACT | Status: DC

## 2019-06-19 MED ORDER — ASPIRIN EC 325 MG PO TBEC
325.0000 mg | DELAYED_RELEASE_TABLET | Freq: Two times a day (BID) | ORAL | Status: DC
  Administered 2019-06-19 – 2019-06-20 (×2): 325 mg via ORAL
  Filled 2019-06-19 (×2): qty 1

## 2019-06-19 MED ORDER — DOCUSATE SODIUM 283 MG RE ENEM
1.0000 | ENEMA | RECTAL | Status: DC | PRN
  Filled 2019-06-19: qty 1

## 2019-06-19 NOTE — Consult Note (Addendum)
[facility] Renal Consultation Note    Indication for Consultation:  Management of ESRD/hemodialysis, anemia, hypertension/volume, and secondary hyperparathyroidism.  HPI: Kimberly [name] is a 63 y.o. female with a PMH of ESRD on dialysis, first degree AV block, HTN, hypothyroidism, and renal transplant failure who presented to the ED on 06/19/2019 with chest pain since early this AM. Patient reports sharp pain in the left breast region with deep breaths. Pain does not radiate. Denies associated diaphoresis, nausea, vomiting, or shortness of breath. Reports she has had some dyspnea on exertion lately and her EDW is being gradually lowered at her outpatient dialysis unit. Reports she does not have much of an appetite lately but is planning to start megace as ordered by Dr. Jimmy Footman, outpatient nephrologist. On presentation to the ED, HS troponin 15, K+ 3.3, BUN 28, Cr 5.62, Ca 9.8, WBC 4.0, Hgb 9.7. CT with micro nodularity in central lungs with associated mineralization in some regions, overall unchanged. Stable pulmonary nodule. Interval "Mosaic attenuation" suggestive of small airway disease, and + ascites. d dimer 0.92, preliminary LE [place] negative for DT. She was treated with dilaudid and chest pain improved but did not fully resolve.   Patient normally dialyzes TTS but missed dialysis today due to ED visit. She has been compliant with HD prior to this. She is concerned that she cannot wait until Monday/Tuesday for dialysis as she feels she has excess volume. Has been resistant to lowering EDW in the past but agreeable today. Denies SOB at rest but does endorse some DOE lately. No orthopnea. Reports chronic cough and unchanged ascites. Denies LE edema. Nephrology consulted for management of hemodialysis.   Past Medical History:  Diagnosis Date  . Anemia   . Asthma   . Complication of anesthesia    Slow to awaken and AMS- took 1 month to memory and recall to return  . Diabetes  mellitus    medication induced with  steroids , 05/19/18 - last A1C was 4.something  . Diverticulosis   . Dyspnea    with exertion. when walking up stairs or walking a distance  . First degree AV block   . GERD (gastroesophageal reflux disease)   . Heart murmur    child  . History of blood transfusion   . History of chronic cough   . History of hiatal hernia   . History of hoarseness   . Hypertension    history; resolved on dialysis  . Hypothyroidism   . IgA nephropathy    ESRD - Hemo TTH Sat  . Immunosuppression ([place])   . Kidney transplanted 01/2010   [facility], has had some rejection  . Patient on peritoneal dialysis ([place])   . Pneumonia   . Premature atrial beat   . Premature ventricular beat   . Renal transplant failure and rejection 2012  . Shingles 03/2011  . Stroke [facility]) October 2010, March 2013   cerebral aneurysm, 07/2017   Past Surgical History:  Procedure Laterality Date  . AV FISTULA PLACEMENT Left 05/20/2018   Procedure: INSERTION OF ARTERIOVENOUS (AV) GORE-TEX GRAFT LEFT  ARM;  Surgeon: [name], MD;  Location: [place];  Service: Vascular;  Laterality: Left;  . CAPD INSERTION  08/25/2008   open, Dr [name]  . CAPD INSERTION N/A 12/14/2013   Procedure: LAPAROSCOPIC INSERTION CONTINUOUS AMBULATORY PERITONEAL DIALYSIS  (CAPD) CATHETER;  Surgeon: [name], MD;  Location: [place];  Service: General;  Laterality: N/A;  . CAPD REMOVAL  03/22/2010   Dr  Weatherly  . COLONOSCOPY    . ESOPHAGOGASTRODUODENOSCOPY    . EYE SURGERY Bilateral    radial keratotomy  . Hemodialysis catheter Right 02/13/2018  . INSERTION OF MESH N/A 12/14/2013   Procedure: INSERTION OF MESH;  Surgeon: [name], MD;  Location: [place];  Service: General;  Laterality: N/A;  . KIDNEY TRANSPLANT Right 01/2010   [place]  . LAPAROSCOPIC LYSIS OF ADHESIONS N/A 12/14/2013   Procedure: LAPAROSCOPIC LYSIS OF ADHESIONS;  Surgeon: [name], MD;  Location: [place];  Service: General;   Laterality: N/A;  . TOTAL HIP ARTHROPLASTY Left 02/06/2019   Procedure: TOTAL HIP ARTHROPLASTY ANTERIOR APPROACH;  Surgeon: Rod Can, MD;  Location: [place];  Service: Orthopedics;  Laterality: Left;  . UMBILICAL HERNIA REPAIR  10/25/2009   open with mesh,  Dr [name]  . URETER REVISION  04/2011   [facility]  . VENTRAL HERNIA REPAIR N/A 12/14/2013   Procedure: LAPAROSCOPIC VENTRAL HERNIA;  Surgeon: [name], MD;  Location: [place];  Service: General;  Laterality: N/A;  . VIDEO ASSISTED THORACOSCOPY (VATS)/WEDGE RESECTION Left 09/01/2014   Procedure: VIDEO ASSISTED THORACOSCOPY (VATS)/WEDGE RESECTION;  Surgeon: [name], MD;  Location: [name];  Service: Thoracic;  Laterality: Left;  Marland Kitchen VIDEO BRONCHOSCOPY Bilateral 07/05/2014   Procedure: VIDEO BRONCHOSCOPY WITH FLUORO;  Surgeon: [name], MD;  Location: [facility];  Service: Cardiopulmonary;  Laterality: Bilateral;   Family History  Problem Relation Age of Onset  . Coronary artery disease Mother   . Emphysema Mother        smoked  . Ovarian cancer Mother   . Heart disease Mother   . Stroke Maternal Uncle   . Diabetes Maternal Uncle   . Kidney disease Maternal Uncle   . Liver disease Father    Social History:  reports that she has never smoked. She has never used smokeless tobacco. She reports current alcohol use. She reports that she does not use drugs.  ROS: As per HPI otherwise negative.   Physical Exam: Vitals:   06/19/19 1245 06/19/19 1300 06/19/19 1415 06/19/19 1515  BP: 121/62 120/64 140/68 140/67  Pulse: (!) 55 (!) 53 (!) 51 (!) 51  Resp: 19 20 15 14   Temp:      TempSrc:      SpO2: 90% 94% 95% 95%  Weight:      Height:         General: Well developed, well nourished, in no acute distress. Head: Normocephalic, atraumatic, sclera non-icteric, mucus membranes are moist. Neck: JVD not elevated. Lungs: Clear bilaterally to auscultation without wheezes, rales, or rhonchi. Breathing is unlabored on RA,  speaking in full sentences Heart: RRR with normal S1, S2. No murmurs, rubs, or gallops appreciated. L sided chest pain with deep inspiration Abdomen: Soft, non-tender, + ascites. Normoactive bowel sounds. No rebound/guarding. No obvious abdominal masses. Musculoskeletal:  Strength and tone appear normal for age. Lower extremities: No edema or ischemic changes, no open wounds. Neuro: Alert and oriented X 3. Moves all extremities spontaneously. Psych:  Responds to questions appropriately with a normal affect. Dialysis Access: LUE AVG + bruit  Allergies  Allergen Reactions  . Mircera [Methoxy Polyethylene Glycol-Epoetin Beta] Other (See Comments)    Severe joint and muscle pain  . Dapsone Rash and Other (See Comments)    Pain in feet  . Pregabalin Rash and Other (See Comments)    fever  . Sulfonamide Derivatives Rash  . Tramadol Rash and Other (See Comments)  Rash on cheek   Prior to Admission medications   Medication Sig Start Date End Date Taking? Authorizing Provider  amLODipine (NORVASC) 5 MG tablet Take 5 mg by mouth at bedtime.    [provider]  aspirin EC 325 MG tablet Take 1 tablet (325 mg total) by mouth 2 (two) times a day. 02/09/19   Domenic Polite, MD  cinacalcet (SENSIPAR) 30 MG tablet Take 30 mg by mouth daily after supper.    [provider]  EPOETIN ALFA IJ Inject into the skin See admin instructions. Inject  subcutaneously at dialysis as needed for low hemoglobin    [provider]  fluticasone furoate-vilanterol (BREO ELLIPTA) 200-25 MCG/INH AEPB Inhale 1 puff into the lungs every evening.    [provider]  HYDROcodone-acetaminophen (NORCO/VICODIN) 5-325 MG tablet Take 1 tablet by mouth every 4 (four) hours as needed for moderate pain. 02/09/19   Swinte[place], Aaron Edelman, MD  hydroxychloroquine (PLAQUENIL) 200 MG tablet Take 200 mg by mouth daily with lunch.    [provider]  lanthanum (FOSRENOL) 1000 MG chewable tablet Chew  1,000 mg by mouth 3 (three) times daily with meals.    [provider]  levothyroxine (SYNTHROID, LEVOTHROID) 50 MCG tablet Take 50-75 mcg by mouth See admin instructions. Take  1 1/2 tablets (75 mcg) by mouth daily on Tuesday and Thursday nights, take 1 tablet (50 mcg) by mouth daily on all other nights of the week    [provider]  [place] Take 4 capsules by mouth 2 (two) times a day. Juice plus - 1 fruit, 1 vegetable, 1 berries, 1 omega - twice daily    [provider]  PSYLLIUM PO Take 3 capsules by mouth every evening.    [provider]  senna-docusate (SENOKOT-S) 8.6-50 MG tablet Take 1 tablet by mouth at bedtime as needed for mild constipation. 02/09/19   Domenic Polite, MD  temazepam (RESTORIL) 15 MG capsule Take 15 mg by mouth at bedtime as needed for sleep.    [provider]  Tiotropium Bromide Monohydrate (SPIRIVA RESPIMAT) 1.25 MCG/ACT AERS Inhale 2 puffs into the lungs daily.     [provider]   Current Facility-Administered Medications  Medication Dose Route Frequency Provider Last Rate Last Dose  . sodium chloride flush (NS) 0.9 % injection 3 mL  3 mL Intravenous Once Charlesetta Shanks, MD       Current Outpatient Medications  Medication Sig Dispense Refill  . amLODipine (NORVASC) 5 MG tablet Take 5 mg by mouth at bedtime.    Marland Kitchen aspirin EC 325 MG tablet Take 1 tablet (325 mg total) by mouth 2 (two) times a day. 30 tablet 0  . cinacalcet (SENSIPAR) 30 MG tablet Take 30 mg by mouth daily after supper.    . EPOETIN ALFA IJ Inject into the skin See admin instructions. Inject  subcutaneously at dialysis as needed for low hemoglobin    . fluticasone furoate-vilanterol (BREO ELLIPTA) 200-25 MCG/INH AEPB Inhale 1 puff into the lungs every evening.    Marland Kitchen HYDROcodone-acetaminophen (NORCO/VICODIN) 5-325 MG tablet Take 1 tablet by mouth every 4 (four) hours as needed for moderate pain. 30 tablet 0  . hydroxychloroquine  (PLAQUENIL) 200 MG tablet Take 200 mg by mouth daily with lunch.    . lanthanum (FOSRENOL) 1000 MG chewable tablet Chew 1,000 mg by mouth 3 (three) times daily with meals.    Marland Kitchen levothyroxine (SYNTHROID, LEVOTHROID) 50 MCG tablet Take 50-75 mcg by mouth See  admin instructions. Take  1 1/2 tablets (75 mcg) by mouth daily on Tuesday and Thursday nights, take 1 tablet (50 mcg) by mouth daily on all other nights of the week    . OVER THE COUNTER MEDICATION Take 4 capsules by mouth 2 (two) times a day. Juice plus - 1 fruit, 1 vegetable, 1 berries, 1 omega - twice daily    . PSYLLIUM PO Take 3 capsules by mouth every evening.    . senna-docusate (SENOKOT-S) 8.6-50 MG tablet Take 1 tablet by mouth at bedtime as needed for mild constipation. 10 tablet 0  . temazepam (RESTORIL) 15 MG capsule Take 15 mg by mouth at bedtime as needed for sleep.    . Tiotropium Bromide Monohydrate (SPIRIVA RESPIMAT) 1.25 MCG/ACT AERS Inhale 2 puffs into the lungs daily.      Labs: Basic Metabolic Panel: Recent Labs  Lab 06/19/19 0531  NA 135  K 3.3*  CL 94*  CO2 28  GLUCOSE 86  BUN 28*  CREATININE 5.64*  CALCIUM 9.8   CBC: Recent Labs  Lab 06/19/19 0531  WBC 4.0  HGB 9.7*  HCT 30.9*  MCV 96.6  PLT 264   Studies/Results: Dg Chest 2 View  Result Date: 06/19/2019 CLINICAL DATA:  Initial evaluation for acute chest pain, chronic dry cough. EXAM: CHEST - 2 VIEW COMPARISON:  Prior radiograph from 02/10/2018. FINDINGS: Mild cardiomegaly. Mediastinal silhouette within normal limits. Aortic atherosclerosis. Lungs well inflated with chronic coarsening of the interstitial markings. No focal infiltrates. Partial obscuration of the left hemidiaphragm most likely due to mediastinal fat. No edema or effusion. No pneumothorax. Suture line noted at the left lung apex. No acute osseous finding. IMPRESSION: Chronic coarsening of the interstitial markings with no active cardiopulmonary disease. Electronically Signed   By: Jeannine Boga M.D.   On: 06/19/2019 07:37   Ct Chest Wo Contrast  Result Date: 06/19/2019 CLINICAL DATA:  Chest pain and shortness of breath. EXAM: CT CHEST WITHOUT CONTRAST TECHNIQUE: Multidetector CT imaging of the chest was performed following the standard protocol without IV contrast. COMPARISON:  10/29/2017.  CT urogram from 08/26/2012 FINDINGS: Cardiovascular: Heart size upper normal to mildly increased. No substantial pericardial effusion. Coronary artery calcification is evident. Atherosclerotic calcification is noted in the wall of the thoracic aorta. Mediastinum/Nodes: No mediastinal lymphadenopathy. No evidence for gross hilar lymphadenopathy although assessment is limited by the lack of intravenous contrast on today's study. The esophagus has normal imaging features. There is no axillary lymphadenopathy. Lungs/Pleura: Areas of clustered micro nodularity are identified in the lungs bilaterally, some of which appear mineralized period imaging appearance is similar to prior. Staple line noted left apex, unchanged. 12 mm right middle lobe pulmonary nodule (99/8) is stable. Previously described elongated right middle lobe pulmonary nodule has resolved in the interval. Mosaic attenuation is noted in the lungs bilaterally today, nonspecific but likely related to air trapping. No dense focal airspace consolidation. No pleural effusion. Upper Abdomen: There is free fluid in the abdomen, seen anterior to the liver and spleen. Cystic lesion identified in the posterior right upper abdomen represents a markedly hydronephrotic native right kidney with thinning of the overlying cortex, similar to the 2013 exam. Left kidney incompletely visualized but markedly atrophic, as seen previously. Musculoskeletal: No worrisome lytic or sclerotic osseous abnormality. IMPRESSION: 1. Areas of clustered micro nodularity in the central lungs bilaterally with associated mineralization in some regions. Overall appearance is not  substantially changed in suggests chronic/indolent atypical infection (including MAI). 2. Dominant 12 mm  right middle lobe pulmonary nodule measured on the previous exam is stable. Close follow-up recommended. 3. Interval development of mosaic attenuation in the lungs bilaterally. This is a nonspecific finding but can be seen in the setting of small airway disease/air trapping. 4. Ascites again noted. Electronically Signed   By: Misty Stanley M.D.   On: 06/19/2019 10:41   Vas [place] Lower Extremity Venous (dvt) (only Mc & Wl 7a-7p)  Result Date: 06/19/2019  Lower Venous Study Indications: Chest pain, and Swelling.  Comparison Study: No prior study on file for comparison Performing Technologist: Sharion Dove RVS  Examination Guidelines: A complete evaluation includes B-mode imaging, spectral Doppler, color Doppler, and power Doppler as needed of all accessible portions of each vessel. Bilateral testing is considered an integral part of a complete examination. Limited examinations for reoccurring indications may be performed as noted.  +---------+---------------+---------+-----------+----------+--------------+ RIGHT    CompressibilityPhasicitySpontaneityPropertiesThrombus Aging +---------+---------------+---------+-----------+----------+--------------+ CFV      Full           Yes      Yes                                 +---------+---------------+---------+-----------+----------+--------------+ SFJ      Full                                                        +---------+---------------+---------+-----------+----------+--------------+ FV Prox  Full                                                        +---------+---------------+---------+-----------+----------+--------------+ FV Mid   Full                                                        +---------+---------------+---------+-----------+----------+--------------+ FV DistalFull                                                         +---------+---------------+---------+-----------+----------+--------------+ PFV      Full                                                        +---------+---------------+---------+-----------+----------+--------------+ POP      Full           Yes      Yes                                 +---------+---------------+---------+-----------+----------+--------------+ PTV      Full                                                        +---------+---------------+---------+-----------+----------+--------------+  PERO     Full                                                        +---------+---------------+---------+-----------+----------+--------------+   +---------+---------------+---------+-----------+----------+--------------+ LEFT     CompressibilityPhasicitySpontaneityPropertiesThrombus Aging +---------+---------------+---------+-----------+----------+--------------+ CFV      Full           Yes      Yes                                 +---------+---------------+---------+-----------+----------+--------------+ SFJ      Full                                                        +---------+---------------+---------+-----------+----------+--------------+ FV Prox  Full                                                        +---------+---------------+---------+-----------+----------+--------------+ FV Mid   Full                                                        +---------+---------------+---------+-----------+----------+--------------+ FV DistalFull                                                        +---------+---------------+---------+-----------+----------+--------------+ PFV      Full                                                        +---------+---------------+---------+-----------+----------+--------------+ POP      Full           Yes      Yes                                  +---------+---------------+---------+-----------+----------+--------------+ PTV      Full                                                        +---------+---------------+---------+-----------+----------+--------------+ PERO     Full                                                        +---------+---------------+---------+-----------+----------+--------------+  Summary: Right: There is no evidence of deep vein thrombosis in the lower extremity. Left: There is no evidence of deep vein thrombosis in the lower extremity.  *See table(s) above for measurements and observations.    Preliminary     Dialysis Orders: Center: [facility]  on TTS. Time 4 hours, 180NRe, EDW 52kg (left at 51.2kg last treatment), BFR 400/DFR 800, 2K/2Ca, LUE AVG Heparin 5500 units Epogen 20000 units q week (last dose Calcitriol 0.6mcg PO q HD Calcium acetate 2 tabs PO TID with meals Fosrenol 1 tab PO TID with meals Sensipar 30mg  PO QD  Assessment/Plan: 1.  Chest pain: Occurs only with deep inspiration. CTA shows stable lung disease. D dimer minimally elevated and preliminary [place] without evidence of DVT. It is possible symptoms may improve with volume removal with dialysis. Further management per primary team. 2.  ESRD:  TTS schedule, missed HD today. K+ 3.3, will use 4K bath. No urgent need for dialysis at present. Plan for HD likely tomorrow morning depending on patient load (patient aware that we cannot guarantee a treatment time), then may resume outpatient TTS schedule.  3.  Hypertension/volume: BP moderately elevated, presenting below her EDW. Dry weight recently lowered at outpatient unit. Endorses unchanged dyspnea on exertion. UFG 2-2.5L with dialysis, will need EDW lowered at discharge.  4.  Anemia: Hemoglobin 9.7. On weekly aranesp (allergy to mircera) 5.  Metabolic bone disease: Calcium 9.8. Continue calcitriol, sensipar and outpatient binders.  6.  Nutrition:  Albumin pending.  Recommend renal diet with fluid restrictions.  Anice Paganini, PA-C 06/19/2019, 4:01 PM  [facility] Pager: [phone]  Pt seen, examined and agree w A/P as above.  Kelly Splinter  MD 06/19/2019, 4:40 PM

## 2019-06-19 NOTE — ED Notes (Signed)
ultrasound in room as well at this time.

## 2019-06-19 NOTE — ED Notes (Signed)
Patient transported to CT 

## 2019-06-19 NOTE — Progress Notes (Signed)
VASCULAR LAB PRELIMINARY  PRELIMINARY  PRELIMINARY  PRELIMINARY  Bilateral lower extremity venous duplex completed.    Preliminary report:  See CV proc for preliminary results.  Gave report to Dr. Marilu Favre, [facility], RVT 06/19/2019, 2:05 PM

## 2019-06-19 NOTE — H&P (Signed)
History and Physical    Kimberly [name] KKX:[id number] DOB: [birth year] DOA: 06/19/2019  PCP: [name], MD Consultants:  Buford Dresser - pulmonology; Kuzma/Swinteck - orthopedics; Deterding - nephrology; Trudie Reed - rheumatology Patient coming from:  Home - lives with husband, son; Donald Prose: Husband, [phone]  Chief Complaint: chest pain  HPI: Kimberly [name] is a 63 y.o. female with medical history significant of CVA; failed renal transplant, ESRD due to IgA nephropathy on TTS HD; asthma; hypothyroidism; HTN; and DM presenting with chest pain.  She developed chest pain this AM.  It awakened her from sleeping.  The pain was in her left breast.  It got better with Dilaudid but did not resolve.  It is still there where she takes a deep breath.  She has been SOB for the last few weeks, maybe they haven't been pulling enough fluid off.  No diaphoresis, nausea.  She feels ok now.   ED Course:  HD patient, missed today's session, presented with chest pain, pleuritic.  No fever, acute cough.  Has contrast allergy.  DVT US negative.  D-dimer minimally elevated.  Low probability of PE.  Chest pain improved with morphine.  Nephrology wants to watch overnight and do HD tomorrow.  CT with chronic findings.  Review of Systems: As per HPI; otherwise review of systems reviewed and negative.   Ambulatory Status:  Ambulates without assistance  Past Medical History:  Diagnosis Date   Anemia    Asthma    Complication of anesthesia    Slow to awaken and AMS- took 1 month to memory and recall to return   Diabetes mellitus    medication induced with  steroids , 05/19/18 - last A1C was 4.something   Diverticulosis    Dyspnea    with exertion. when walking up stairs or walking a distance   First degree AV block    GERD (gastroesophageal reflux disease)    Heart murmur    child   History of blood transfusion    History of chronic cough    History of hiatal hernia    History of hoarseness     Hypertension    history; resolved on dialysis   Hypothyroidism    IgA nephropathy    ESRD - Hemo TTH Sat   Pneumonia    Premature atrial beat    Premature ventricular beat    Renal transplant failure and rejection 2012   Shingles 03/2011   Stroke [facility]) October 2010, March 2013   cerebral aneurysm, 07/2017    Past Surgical History:  Procedure Laterality Date   AV FISTULA PLACEMENT Left 05/20/2018   Procedure: INSERTION OF ARTERIOVENOUS (AV) GORE-TEX GRAFT LEFT  ARM;  Surgeon: [name], MD;  Location: [place];  Service: Vascular;  Laterality: Left;   CAPD INSERTION  08/25/2008   open, Dr [name]   CAPD INSERTION N/A 12/14/2013   Procedure: LAPAROSCOPIC INSERTION CONTINUOUS AMBULATORY PERITONEAL DIALYSIS  (CAPD) CATHETER;  Surgeon: [name], MD;  Location: [place];  Service: General;  Laterality: N/A;   CAPD REMOVAL  03/22/2010   Dr [name]   COLONOSCOPY     ESOPHAGOGASTRODUODENOSCOPY     EYE SURGERY Bilateral    radial keratotomy   Hemodialysis catheter Right 02/13/2018   INSERTION OF MESH N/A 12/14/2013   Procedure: INSERTION OF MESH;  Surgeon: [name], MD;  Location: [place];  Service: General;  Laterality: N/A;   KIDNEY TRANSPLANT Right 01/2010   [facility]   LAPAROSCOPIC LYSIS OF ADHESIONS N/A  12/14/2013   Procedure: LAPAROSCOPIC LYSIS OF ADHESIONS;  Surgeon: [name], MD;  Location: [place];  Service: General;  Laterality: N/A;   TOTAL HIP ARTHROPLASTY Left 02/06/2019   Procedure: TOTAL HIP ARTHROPLASTY ANTERIOR APPROACH;  Surgeon: Rod Can, MD;  Location: [place];  Service: Orthopedics;  Laterality: Left;   UMBILICAL HERNIA REPAIR  10/25/2009   open with mesh,  Dr [name]   URETER REVISION  04/2011   [facility]   VENTRAL HERNIA REPAIR N/A 12/14/2013   Procedure: LAPAROSCOPIC VENTRAL HERNIA;  Surgeon: [name], MD;  Location: [place];  Service: General;  Laterality: N/A;   [place] (VATS)/WEDGE RESECTION  Left 09/01/2014   Procedure: VIDEO ASSISTED THORACOSCOPY (VATS)/WEDGE RESECTION;  Surgeon: [name], MD;  Location: [place];  Service: Thoracic;  Laterality: Left;   VIDEO BRONCHOSCOPY Bilateral 07/05/2014   Procedure: VIDEO BRONCHOSCOPY WITH FLUORO;  Surgeon: [name], MD;  Location: [facility];  Service: Cardiopulmonary;  Laterality: Bilateral;    Social History   Socioeconomic History   Marital status: Married    Spouse name: Not on file   Number of children: 1   Years of education: Not on file   Highest education level: Not on file  Occupational History   Occupation: retired  Scientist, product/process development strain: Not on file   Food insecurity    Worry: Not on file    Inability: Not on Lexicographer needs    Medical: Not on file    Non-medical: Not on file  Tobacco Use   Smoking status: Never Smoker   Smokeless tobacco: Never Used  Substance and Sexual Activity   Alcohol use: Yes    Comment: ocassional- not weekly   Drug use: No   Sexual activity: Yes    Birth control/protection: Post-menopausal  Lifestyle   Physical activity    Days per week: Not on file    Minutes per session: Not on file   Stress: Not on file  Relationships   Social connections    Talks on phone: Not on file    Gets together: Not on file    Attends religious service: Not on file    Active member of club or organization: Not on file    Attends meetings of clubs or organizations: Not on file    Relationship status: Not on file   Intimate partner violence    Fear of current or ex partner: Not on file    Emotionally abused: Not on file    Physically abused: Not on file    Forced sexual activity: Not on file  Other Topics Concern   Not on file  Social History Narrative   Pt lives in [place].  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    Allergies  Allergen Reactions   Mircera [Methoxy Polyethylene Glycol-Epoetin  Beta] Other (See Comments)    Severe joint and muscle pain   Dapsone Rash and Other (See Comments)    Pain in feet   Pregabalin Rash and Other (See Comments)    fever   Sulfonamide Derivatives Rash   Tramadol Rash and Other (See Comments)    Rash on cheek    Family History  Problem Relation Age of Onset   Coronary artery disease Mother    Emphysema Mother        smoked   Ovarian cancer Mother    Heart disease Mother    Stroke Maternal Uncle  Diabetes Maternal Uncle    Kidney disease Maternal Uncle    Liver disease Father     Prior to Admission medications   Medication Sig Start Date End Date Taking? Authorizing Provider  amLODipine (NORVASC) 5 MG tablet Take 5 mg by mouth at bedtime.    [provider]  aspirin EC 325 MG tablet Take 1 tablet (325 mg total) by mouth 2 (two) times a day. 02/09/19   Domenic Polite, MD  cinacalcet (SENSIPAR) 30 MG tablet Take 30 mg by mouth daily after supper.    [provider]  EPOETIN ALFA IJ Inject into the skin See admin instructions. Inject  subcutaneously at dialysis as needed for low hemoglobin    [provider]  fluticasone furoate-vilanterol (BREO ELLIPTA) 200-25 MCG/INH AEPB Inhale 1 puff into the lungs every evening.    [provider]  HYDROcodone-acetaminophen (NORCO/VICODIN) 5-325 MG tablet Take 1 tablet by mouth every 4 (four) hours as needed for moderate pain. 02/09/19   Swinteck, Aaron Edelman, MD  hydroxychloroquine (PLAQUENIL) 200 MG tablet Take 200 mg by mouth daily with lunch.    [provider]  lanthanum (FOSRENOL) 1000 MG chewable tablet Chew 1,000 mg by mouth 3 (three) times daily with meals.    [provider]  levothyroxine (SYNTHROID, LEVOTHROID) 50 MCG tablet Take 50-75 mcg by mouth See admin instructions. Take  1 1/2 tablets (75 mcg) by mouth daily on Tuesday and Thursday nights, take 1 tablet (50 mcg) by mouth daily on all other nights of the week    [provider]  [place] Take 4 capsules by mouth 2 (two) times a day. Juice plus - 1 fruit, 1 vegetable, 1 berries, 1 omega - twice daily    [provider]  PSYLLIUM PO Take 3 capsules by mouth every evening.    [provider]  senna-docusate (SENOKOT-S) 8.6-50 MG tablet Take 1 tablet by mouth at bedtime as needed for mild constipation. 02/09/19   Domenic Polite, MD  temazepam (RESTORIL) 15 MG capsule Take 15 mg by mouth at bedtime as needed for sleep.    [provider]  Tiotropium Bromide Monohydrate (SPIRIVA RESPIMAT) 1.25 MCG/ACT AERS Inhale 2 puffs into the lungs daily.     [provider]    Physical Exam: Vitals:   06/19/19 1245 06/19/19 1300 06/19/19 1415 06/19/19 1515  BP: 121/62 120/64 140/68 140/67  Pulse: (!) 55 (!) 53 (!) 51 (!) 51  Resp: 19 20 15 14   Temp:      TempSrc:      SpO2: 90% 94% 95% 95%  Weight:      Height:          General:  Appears calm and comfortable and is NAD  Eyes:  PERRL, EOMI, normal lids, iris  ENT:  grossly normal hearing, lips & tongue, mmm; appropriate dentition  Neck:  no LAD, masses or thyromegaly  Cardiovascular:  RRR, no r/g, 1-6/0 systolic murmur. No LE edema.   Respiratory:   CTA bilaterally with no wheezes/rales/rhonchi.  Normal respiratory effort.  Abdomen:  soft, NT, NABS, protuberant abdomen with chronic ascites  Skin:  no rash or induration seen on limited exam  Musculoskeletal:  grossly normal tone BUE/BLE, good ROM, no bony abnormality  Psychiatric:  grossly normal mood and affect, speech fluent and appropriate, AOx3  Neurologic:  CN 2-12 grossly intact, moves all extremities in coordinated fashion, sensation intact    Radiological Exams on Admission: Dg Chest 2 View  Result Date: 06/19/2019 CLINICAL DATA:  Initial evaluation for acute chest pain, chronic dry cough. EXAM: CHEST - 2 VIEW COMPARISON:  Prior radiograph from 02/10/2018. FINDINGS: Mild  cardiomegaly. Mediastinal silhouette within normal limits. Aortic atherosclerosis. Lungs well inflated with chronic coarsening of the interstitial markings. No focal infiltrates. Partial obscuration of the left hemidiaphragm most likely due to mediastinal fat. No edema or effusion. No pneumothorax. Suture line noted at the left lung apex. No acute osseous finding. IMPRESSION: Chronic coarsening of the interstitial markings with no active cardiopulmonary disease. Electronically Signed   By: Jeannine Boga M.D.   On: 06/19/2019 07:37   Ct Chest Wo Contrast  Result Date: 06/19/2019 CLINICAL DATA:  Chest pain and shortness of breath. EXAM: CT CHEST WITHOUT CONTRAST TECHNIQUE: Multidetector CT imaging of the chest was performed following the standard protocol without IV contrast. COMPARISON:  10/29/2017.  CT urogram from 08/26/2012 FINDINGS: Cardiovascular: Heart size upper normal to mildly increased. No substantial pericardial effusion. Coronary artery calcification is evident. Atherosclerotic calcification is noted in the wall of the thoracic aorta. Mediastinum/Nodes: No mediastinal lymphadenopathy. No evidence for gross hilar lymphadenopathy although assessment is limited by the lack of intravenous contrast on today's study. The esophagus has normal imaging features. There is no axillary lymphadenopathy. Lungs/Pleura: Areas of clustered micro nodularity are identified in the lungs bilaterally, some of which appear mineralized period imaging appearance is similar to prior. Staple line noted left apex, unchanged. 12 mm right middle lobe pulmonary nodule (99/8) is stable. Previously described elongated right middle lobe pulmonary nodule has resolved in the interval. Mosaic attenuation is noted in the lungs bilaterally today, nonspecific but likely related to air trapping. No dense focal airspace consolidation. No pleural effusion. Upper Abdomen: There is free fluid in the abdomen, seen anterior to the liver and  spleen. Cystic lesion identified in the posterior right upper abdomen represents a markedly hydronephrotic native right kidney with thinning of the overlying cortex, similar to the 2013 exam. Left kidney incompletely visualized but markedly atrophic, as seen previously. Musculoskeletal: No worrisome lytic or sclerotic osseous abnormality. IMPRESSION: 1. Areas of clustered micro nodularity in the central lungs bilaterally with associated mineralization in some regions. Overall appearance is not substantially changed in suggests chronic/indolent atypical infection (including MAI). 2. Dominant 12 mm right middle lobe pulmonary nodule measured on the previous exam is stable. Close follow-up recommended. 3. Interval development of mosaic attenuation in the lungs bilaterally. This is a nonspecific finding but can be seen in the setting of small airway disease/air trapping. 4. Ascites again noted. Electronically Signed   By: Misty Stanley M.D.   On: 06/19/2019 10:41   Vas [place] Lower Extremity Venous (dvt) (only Mc & Wl 7a-7p)  Result Date: 06/19/2019  Lower Venous Study Indications: Chest pain, and Swelling.  Comparison Study: No prior study on file for comparison Performing Technologist: Sharion Dove RVS  Examination Guidelines: A complete evaluation includes B-mode imaging, spectral Doppler, color Doppler, and power Doppler as needed of all accessible portions of each vessel. Bilateral testing is considered an integral part of a complete examination. Limited examinations for reoccurring indications may be performed as noted.  +---------+---------------+---------+-----------+----------+--------------+  RIGHT     Compressibility Phasicity Spontaneity Properties Thrombus Aging  +---------+---------------+---------+-----------+----------+--------------+  CFV       Full            Yes       Yes                                    +---------+---------------+---------+-----------+----------+--------------+  SFJ       Full                                                              +---------+---------------+---------+-----------+----------+--------------+  FV Prox   Full                                                             +---------+---------------+---------+-----------+----------+--------------+  FV Mid    Full                                                             +---------+---------------+---------+-----------+----------+--------------+  FV Distal Full                                                             +---------+---------------+---------+-----------+----------+--------------+  PFV       Full                                                             +---------+---------------+---------+-----------+----------+--------------+  POP       Full            Yes       Yes                                    +---------+---------------+---------+-----------+----------+--------------+  PTV       Full                                                             +---------+---------------+---------+-----------+----------+--------------+  PERO      Full                                                             +---------+---------------+---------+-----------+----------+--------------+   +---------+---------------+---------+-----------+----------+--------------+  LEFT      Compressibility Phasicity Spontaneity Properties Thrombus Aging  +---------+---------------+---------+-----------+----------+--------------+  CFV       Full            Yes       Yes                                    +---------+---------------+---------+-----------+----------+--------------+  SFJ       Full                                                             +---------+---------------+---------+-----------+----------+--------------+  FV Prox   Full                                                             +---------+---------------+---------+-----------+----------+--------------+  FV Mid    Full                                                              +---------+---------------+---------+-----------+----------+--------------+  FV Distal Full                                                             +---------+---------------+---------+-----------+----------+--------------+  PFV       Full                                                             +---------+---------------+---------+-----------+----------+--------------+  POP       Full            Yes       Yes                                    +---------+---------------+---------+-----------+----------+--------------+  PTV       Full                                                             +---------+---------------+---------+-----------+----------+--------------+  PERO      Full                                                             +---------+---------------+---------+-----------+----------+--------------+     Summary: Right: There is no evidence of deep vein thrombosis in the lower extremity. Left: There is no evidence of deep vein thrombosis in the lower extremity.  *See table(s) above for measurements and observations.    Preliminary     EKG: Independently reviewed.  NSR with rate 61; nonspecific ST  changes with no evidence of acute ischemia   Labs on Admission: I have personally reviewed the available labs and imaging studies at the time of the admission.  Pertinent labs:   K+ 3.3 BUN 28/Creatinine 5.64/GFR 7 HS troponin 16, 15 WBC 4.0 Hgb 9.7 D-dimer 0.92 COVID pending; negative on 5/15   Assessment/Plan Principal Problem:   Atypical chest pain Active Problems:   Hypothyroidism, unspecified   HTN (hypertension)   DM type 2 causing complication (HCC)   Chronic cough   ESRD (end stage renal disease) (HCC)   Atypical chest pain -Patient presenting with left-sided CP that awakened here from sleep, improved with Dilaudid -Low suspicion for ACS at this time -HS troponin negative x 2 -PE was also entertained but she has a contrast allergy and so can't have  CTA -Low D-dimer, no hypoxia, no tachycardia, negative DVT US -> low suspicion for PE -Will observe overnight on telemetry and assess after HD to see if she has ongoing symptoms -Could consider Echo if ongoing symptoms post-HD  ESRD on HD -Patient on TTS HD, was due for HD today -She has IgA nephropathy and failed renal transplant -She thinks that she may need a bit of extra volume pulled off during HD -Since she missed today's HD session, she would not otherwise be dialyzed until Tuesday -As such, Dr. Jonnie Finner has asked [place] to observe overnight and he will do HD tomorrow -Continue Sensipar, Renavite, Plaquenil  Chronic cough -Patient with asthma, seen by pulm -Continue Breo, Spiriva -Will add prn Albuterol HFA -Low suspicion for acute disease based on today's imaging and patient report  HTN -Continue Norvasc  DM -Remote A1c was 5.1 in 2014 -She is not on medications for this issue at this time -Will follow with fasting glucose  Hypothyroidism -Check TSH -Continue Synthroid at current dose for now   Note: This patient has been tested and is pending for the novel coronavirus COVID-19.  DVT prophylaxis: Heparin Code Status:  Full - confirmed with patient Family Communication: None present Disposition Plan:  Home once clinically improved Consults called: Nephrology  Admission status: It is my clinical opinion that referral for OBSERVATION is reasonable and necessary in this patient based on the above information provided. The aforementioned taken together are felt to place the patient at high risk for further clinical deterioration. However it is anticipated that the patient may be medically stable for discharge from the hospital within 24 to 48 hours.    Karmen Bongo MD [facility]   How to contact the [facility] Attending or Consulting provider [name] or covering provider during after hours [place], for this patient?  1. Check the care team in [facility] and look for a)  attending/consulting [facility] provider listed and b) the [facility] team listed 2. Log into [email or web address] and use [place]'s universal password to access. If you do not have the password, please contact the hospital operator. 3. Locate the [facility] provider you are looking for under [facility] and page to a number that you can be directly reached. 4. If you still have difficulty reaching the provider, please page the [facility] (Director on Call) for the Hospitalists listed on amion for assistance.   06/19/2019, 4:33 PM

## 2019-06-19 NOTE — ED Notes (Signed)
Reports cp to L side remains a sharp stabbing pain without radiation to other areas.  States today is dialysis day as well and feels she has extra fluid.  Pt restful without acute distress.  Able to speak to full sentences without distress.

## 2019-06-19 NOTE — ED Notes (Signed)
ED TO INPATIENT HANDOFF REPORT  ED Nurse Name and Phone #: Anitha Kreiser [phone]  S Name/Age/Gender  Kimberly [name] 63 y.o. female Room/Bed: 029C/029C  Code Status   Code Status: Full Code  Home/SNF/Other Home Patient oriented to: self, place, time and situation Is this baseline? Yes   Triage Complete: Triage complete  Chief Complaint chest pain  Triage Note Patient c/o left anterior chest pain onset 3 am states it was sharp and stabbing pain , states pain is worse with inspiration . C/o chronic dry cough for 20 years.    Allergies Allergies  Allergen Reactions  . Mircera [Methoxy Polyethylene Glycol-Epoetin Beta] Other (See Comments)    Severe joint and muscle pain  . Dapsone Rash and Other (See Comments)    Pain in feet  . Pregabalin Rash and Other (See Comments)    Fever - reaction to Lyrica  . Sulfonamide Derivatives Rash  . Tramadol Rash and Other (See Comments)    Rash on cheek    Level of Care/Admitting Diagnosis ED Disposition    ED Disposition Condition [facility] Area: [place] [[id number]]  Level of Care: Telemetry Cardiac [103]  I expect the patient will be discharged within 24 hours: Yes  LOW acuity---Tx typically complete <24 hrs---ACUTE conditions typically can be evaluated <24 hours---LABS likely to return to acceptable levels <24 hours---IS near functional baseline---EXPECTED to return to current living arrangement---NOT newly hypoxic: Meets criteria for 5C-Observation unit  Covid Evaluation: Asymptomatic Screening Protocol (No Symptoms)  Diagnosis: Chest pain [841660]  Admitting Physician: Karmen Bongo [[id number]]  Attending Physician: Karmen Bongo [[id number]]  PT Class (Do Not Modify): Observation [104]  PT Acc Code (Do Not Modify): Observation [10022]       B Medical/Surgery History Past Medical History:  Diagnosis Date  . Anemia   . Asthma   . Complication of anesthesia    Slow to awaken and AMS- took 1  month to memory and recall to return  . Diabetes mellitus    medication induced with  steroids , 05/19/18 - last A1C was 4.something  . Diverticulosis   . Dyspnea    with exertion. when walking up stairs or walking a distance  . First degree AV block   . GERD (gastroesophageal reflux disease)   . Heart murmur    child  . History of blood transfusion   . History of chronic cough   . History of hiatal hernia   . History of hoarseness   . Hypertension    history; resolved on dialysis  . Hypothyroidism   . IgA nephropathy    ESRD - Hemo TTH Sat  . Pneumonia   . Premature atrial beat   . Premature ventricular beat   . Renal transplant failure and rejection 2012  . Shingles 03/2011  . Stroke [facility]) October 2010, March 2013   cerebral aneurysm, 07/2017   Past Surgical History:  Procedure Laterality Date  . AV FISTULA PLACEMENT Left 05/20/2018   Procedure: INSERTION OF ARTERIOVENOUS (AV) GORE-TEX GRAFT LEFT  ARM;  Surgeon: [name], MD;  Location: [place];  Service: Vascular;  Laterality: Left;  . CAPD INSERTION  08/25/2008   open, Dr [name]  . CAPD INSERTION N/A 12/14/2013   Procedure: LAPAROSCOPIC INSERTION CONTINUOUS AMBULATORY PERITONEAL DIALYSIS  (CAPD) CATHETER;  Surgeon: [name], MD;  Location: [place];  Service: General;  Laterality: N/A;  . CAPD REMOVAL  03/22/2010   Dr [name]  . COLONOSCOPY    .  ESOPHAGOGASTRODUODENOSCOPY    . EYE SURGERY Bilateral    radial keratotomy  . Hemodialysis catheter Right 02/13/2018  . INSERTION OF MESH N/A 12/14/2013   Procedure: INSERTION OF MESH;  Surgeon: [name], MD;  Location: [place];  Service: General;  Laterality: N/A;  . KIDNEY TRANSPLANT Right 01/2010   [place]  . LAPAROSCOPIC LYSIS OF ADHESIONS N/A 12/14/2013   Procedure: LAPAROSCOPIC LYSIS OF ADHESIONS;  Surgeon: [name], MD;  Location: [place];  Service: General;  Laterality: N/A;  . TOTAL HIP ARTHROPLASTY Left 02/06/2019   Procedure: TOTAL HIP  ARTHROPLASTY ANTERIOR APPROACH;  Surgeon: Rod Can, MD;  Location: [place];  Service: Orthopedics;  Laterality: Left;  . UMBILICAL HERNIA REPAIR  10/25/2009   open with mesh,  Dr [name]  . URETER REVISION  04/2011   [facility]  . VENTRAL HERNIA REPAIR N/A 12/14/2013   Procedure: LAPAROSCOPIC VENTRAL HERNIA;  Surgeon: [name], MD;  Location: [place];  Service: General;  Laterality: N/A;  . VIDEO ASSISTED THORACOSCOPY (VATS)/WEDGE RESECTION Left 09/01/2014   Procedure: VIDEO ASSISTED THORACOSCOPY (VATS)/WEDGE RESECTION;  Surgeon: [name], MD;  Location: [place];  Service: Thoracic;  Laterality: Left;  Marland Kitchen VIDEO BRONCHOSCOPY Bilateral 07/05/2014   Procedure: VIDEO BRONCHOSCOPY WITH FLUORO;  Surgeon: [name], MD;  Location: [facility];  Service: Cardiopulmonary;  Laterality: Bilateral;     A IV Location/Drains/Wounds Patient Lines/Drains/Airways Status   Active Line/Drains/Airways    Name:   Placement date:   Placement time:   Site:   Days:   Peripheral IV 06/19/19 Right;Anterior Forearm   06/19/19    1021    Forearm   less than 1   Fistula / Graft Left Upper arm Arteriovenous vein graft   05/20/18    1140    Upper arm   395   Incision (Closed) 12/14/13 Abdomen Other (Comment)   12/14/13    1402     2013   Incision (Closed) 09/01/14 Chest Left   09/01/14    1351     1752   Incision (Closed) 05/20/18 Arm Left   05/20/18    1145     395   Incision (Closed) 05/20/18 Axilla Left   05/20/18    1145     395   Incision (Closed) 02/06/19 Hip Left   02/06/19    1650     133   Incision - 3 Ports Abdomen 1: Right;Upper 2: Right;Mid 3: Right;Lower   12/14/13    -     2013          Intake/Output Last 24 hours No intake or output data in the 24 hours ending 06/19/19 1727  Labs/Imaging Results for orders placed or performed during the hospital encounter of 06/19/19 (from the past 48 hour(s))  Basic metabolic panel     Status: Abnormal   Collection Time: 06/19/19  5:31 AM   Result Value Ref Range   Sodium 135 135 - 145 mmol/L   Potassium 3.3 (L) 3.5 - 5.1 mmol/L   Chloride 94 (L) 98 - 111 mmol/L   CO2 28 22 - 32 mmol/L   Glucose, Bld 86 70 - 99 mg/dL   BUN 28 (H) 8 - 23 mg/dL   Creatinine, Ser 5.64 (H) 0.44 - 1.00 mg/dL   Calcium 9.8 8.9 - 10.3 mg/dL   GFR calc non Af Amer 7 (L) >60 mL/min   GFR calc Af Amer 9 (L) >60 mL/min   Anion gap 13  5 - 15    Comment: Performed at [facility], [place] [address] [place], [place] [address]  CBC     Status: Abnormal   Collection Time: 06/19/19  5:31 AM  Result Value Ref Range   WBC 4.0 4.0 - 10.5 K/uL   RBC 3.20 (L) 3.87 - 5.11 MIL/uL   Hemoglobin 9.7 (L) 12.0 - 15.0 g/dL   HCT 30.9 (L) 36.0 - 46.0 %   MCV 96.6 80.0 - 100.0 fL   MCH 30.3 26.0 - 34.0 pg   MCHC 31.4 30.0 - 36.0 g/dL   RDW 15.2 11.5 - 15.5 %   Platelets 264 150 - 400 K/uL   nRBC 0.0 0.0 - 0.2 %    Comment: Performed at [facility], [place] [address] [place], [place] [address]  Troponin I (High Sensitivity)     Status: None   Collection Time: 06/19/19  5:31 AM  Result Value Ref Range   Troponin I (High Sensitivity) 16 <18 ng/L    Comment: (NOTE) Elevated high sensitivity troponin I (hsTnI) values and significant  changes across serial measurements may suggest ACS but many other  chronic and acute conditions are known to elevate hsTnI results.  Refer to the "Links" section for chest pain algorithms and additional  guidance. Performed at [facility], [place] [address], [place], [place] [address]   Troponin I (High Sensitivity)     Status: None   Collection Time: 06/19/19  8:42 AM  Result Value Ref Range   Troponin I (High Sensitivity) 15 <18 ng/L    Comment: (NOTE) Elevated high sensitivity troponin I (hsTnI) values and significant  changes across serial measurements may suggest ACS but many other  chronic and acute conditions are known to elevate hsTnI results.  Refer to the "Links" section for chest pain algorithms and  additional  guidance. Performed at [facility], [place] [address] [place], [place] [address]   D-dimer, quantitative     Status: Abnormal   Collection Time: 06/19/19 10:30 AM  Result Value Ref Range   D-Dimer, Quant 0.92 (H) 0.00 - 0.50 ug/mL-FEU    Comment: (NOTE) At the manufacturer cut-off of 0.50 ug/mL FEU, this assay has been documented to exclude PE with a sensitivity and negative predictive value of 97 to 99%.  At this time, this assay has not been approved by the FDA to exclude DVT/VTE. Results should be correlated with clinical presentation. Performed at [facility], [place] [address] [place], [place] [address]    Dg Chest 2 View  Result Date: 06/19/2019 CLINICAL DATA:  Initial evaluation for acute chest pain, chronic dry cough. EXAM: CHEST - 2 VIEW COMPARISON:  Prior radiograph from 02/10/2018. FINDINGS: Mild cardiomegaly. Mediastinal silhouette within normal limits. Aortic atherosclerosis. Lungs well inflated with chronic coarsening of the interstitial markings. No focal infiltrates. Partial obscuration of the left hemidiaphragm most likely due to mediastinal fat. No edema or effusion. No pneumothorax. Suture line noted at the left lung apex. No acute osseous finding. IMPRESSION: Chronic coarsening of the interstitial markings with no active cardiopulmonary disease. Electronically Signed   By: Jeannine Boga M.D.   On: 06/19/2019 07:37   Ct Chest Wo Contrast  Result Date: 06/19/2019 CLINICAL DATA:  Chest pain and shortness of breath. EXAM: CT CHEST WITHOUT CONTRAST TECHNIQUE: Multidetector CT imaging of the chest was performed following the standard protocol without IV contrast. COMPARISON:  10/29/2017.  CT urogram from 08/26/2012 FINDINGS: Cardiovascular: Heart size  upper normal to mildly increased. No substantial pericardial effusion. Coronary artery calcification is evident. Atherosclerotic calcification is noted in the wall of the thoracic aorta.  Mediastinum/Nodes: No mediastinal lymphadenopathy. No evidence for gross hilar lymphadenopathy although assessment is limited by the lack of intravenous contrast on today's study. The esophagus has normal imaging features. There is no axillary lymphadenopathy. Lungs/Pleura: Areas of clustered micro nodularity are identified in the lungs bilaterally, some of which appear mineralized period imaging appearance is similar to prior. Staple line noted left apex, unchanged. 12 mm right middle lobe pulmonary nodule (99/8) is stable. Previously described elongated right middle lobe pulmonary nodule has resolved in the interval. Mosaic attenuation is noted in the lungs bilaterally today, nonspecific but likely related to air trapping. No dense focal airspace consolidation. No pleural effusion. Upper Abdomen: There is free fluid in the abdomen, seen anterior to the liver and spleen. Cystic lesion identified in the posterior right upper abdomen represents a markedly hydronephrotic native right kidney with thinning of the overlying cortex, similar to the 2013 exam. Left kidney incompletely visualized but markedly atrophic, as seen previously. Musculoskeletal: No worrisome lytic or sclerotic osseous abnormality. IMPRESSION: 1. Areas of clustered micro nodularity in the central lungs bilaterally with associated mineralization in some regions. Overall appearance is not substantially changed in suggests chronic/indolent atypical infection (including MAI). 2. Dominant 12 mm right middle lobe pulmonary nodule measured on the previous exam is stable. Close follow-up recommended. 3. Interval development of mosaic attenuation in the lungs bilaterally. This is a nonspecific finding but can be seen in the setting of small airway disease/air trapping. 4. Ascites again noted. Electronically Signed   By: Misty Stanley M.D.   On: 06/19/2019 10:41   Vas [place] Lower Extremity Venous (dvt) (only Mc & Wl 7a-7p)  Result Date: 06/19/2019  Lower  Venous Study Indications: Chest pain, and Swelling.  Comparison Study: No prior study on file for comparison Performing Technologist: Sharion Dove RVS  Examination Guidelines: A complete evaluation includes B-mode imaging, spectral Doppler, color Doppler, and power Doppler as needed of all accessible portions of each vessel. Bilateral testing is considered an integral part of a complete examination. Limited examinations for reoccurring indications may be performed as noted.  +---------+---------------+---------+-----------+----------+--------------+ RIGHT    CompressibilityPhasicitySpontaneityPropertiesThrombus Aging +---------+---------------+---------+-----------+----------+--------------+ CFV      Full           Yes      Yes                                 +---------+---------------+---------+-----------+----------+--------------+ SFJ      Full                                                        +---------+---------------+---------+-----------+----------+--------------+ FV Prox  Full                                                        +---------+---------------+---------+-----------+----------+--------------+ FV Mid   Full                                                        +---------+---------------+---------+-----------+----------+--------------+  FV DistalFull                                                        +---------+---------------+---------+-----------+----------+--------------+ PFV      Full                                                        +---------+---------------+---------+-----------+----------+--------------+ POP      Full           Yes      Yes                                 +---------+---------------+---------+-----------+----------+--------------+ PTV      Full                                                        +---------+---------------+---------+-----------+----------+--------------+ PERO     Full                                                         +---------+---------------+---------+-----------+----------+--------------+   +---------+---------------+---------+-----------+----------+--------------+ LEFT     CompressibilityPhasicitySpontaneityPropertiesThrombus Aging +---------+---------------+---------+-----------+----------+--------------+ CFV      Full           Yes      Yes                                 +---------+---------------+---------+-----------+----------+--------------+ SFJ      Full                                                        +---------+---------------+---------+-----------+----------+--------------+ FV Prox  Full                                                        +---------+---------------+---------+-----------+----------+--------------+ FV Mid   Full                                                        +---------+---------------+---------+-----------+----------+--------------+ FV DistalFull                                                        +---------+---------------+---------+-----------+----------+--------------+  PFV      Full                                                        +---------+---------------+---------+-----------+----------+--------------+ POP      Full           Yes      Yes                                 +---------+---------------+---------+-----------+----------+--------------+ PTV      Full                                                        +---------+---------------+---------+-----------+----------+--------------+ PERO     Full                                                        +---------+---------------+---------+-----------+----------+--------------+     Summary: Right: There is no evidence of deep vein thrombosis in the lower extremity. Left: There is no evidence of deep vein thrombosis in the lower extremity.  *See table(s) above for measurements and observations.     Preliminary     Pending Labs Unresulted Labs (From admission, onward)    Start     Ordered   06/19/19 1640  TSH  Once,   STAT     06/19/19 1719   06/19/19 1629  SARS Coronavirus 2 [facility] order, Performed in [facility] hospital lab) Nasopharyngeal Nasopharyngeal Swab  (Symptomatic/High Risk of Exposure/Tier 1 Patients Labs with Precautions)  Once,   STAT    Question Answer Comment  Is this test for diagnosis or screening Screening   Symptomatic for COVID-19 as defined by CDC No   Hospitalized for COVID-19 No   Admitted to ICU for COVID-19 No   Previously tested for COVID-19 Yes   Resident in a congregate (group) care setting No   Employed in healthcare setting No   Pregnant No      06/19/19 1628   Signed and Held  CBC  Once,   R     Signed and Held          Vitals/Pain Today's Vitals   06/19/19 1415 06/19/19 1515 06/19/19 1645 06/19/19 1700  BP: 140/68 140/67  (!) 164/68  Pulse: (!) 51 (!) 51  (!) 59  Resp: 15 14  (!) 24  Temp:   98 F (36.7 C)   TempSrc:   Oral   SpO2: 95% 95%  98%  Weight:      Height:      PainSc:        Isolation Precautions Airborne and Contact precautions  Medications Medications  sodium chloride flush (NS) 0.9 % injection 3 mL (3 mLs Intravenous Not Given 06/19/19 0831)  Chlorhexidine Gluconate Cloth 2 % PADS 6 each (has no administration in time range)  calcitRIOL (ROCALTROL) capsule 0.75 mcg (has no administration in time range)  cinacalcet (SENSIPAR) tablet 30 mg (has  no administration in time range)  aspirin EC tablet 325 mg (has no administration in time range)  hydroxychloroquine (PLAQUENIL) tablet 200 mg (has no administration in time range)  amLODipine (NORVASC) tablet 5 mg (has no administration in time range)  temazepam (RESTORIL) capsule 15 mg (has no administration in time range)  levothyroxine (SYNTHROID) tablet 50-75 mcg (has no administration in time range)  fluticasone furoate-vilanterol (BREO ELLIPTA) 200-25 MCG/INH 1  puff (has no administration in time range)  Tiotropium Bromide Monohydrate AERS 2 puff (has no administration in time range)  acetaminophen (TYLENOL) tablet 650 mg (has no administration in time range)    Or  acetaminophen (TYLENOL) suppository 650 mg (has no administration in time range)  sorbitol 70 % solution 30 mL (has no administration in time range)  docusate sodium (ENEMEEZ) enema 283 mg (has no administration in time range)  ondansetron (ZOFRAN) tablet 4 mg (has no administration in time range)    Or  ondansetron (ZOFRAN) injection 4 mg (has no administration in time range)  camphor-menthol (SARNA) lotion 1 application (has no administration in time range)    And  hydrOXYzine (ATARAX/VISTARIL) tablet 25 mg (has no administration in time range)  calcium carbonate (dosed in mg elemental calcium) suspension 500 mg of elemental calcium (has no administration in time range)  feeding supplement (NEPRO CARB STEADY) liquid 237 mL (has no administration in time range)  heparin injection 5,000 Units (has no administration in time range)  calcium acetate (PHOSLO) capsule 667 mg (has no administration in time range)  HYDROmorphone (DILAUDID) injection 1 mg (1 mg Intravenous Given 06/19/19 1023)    Mobility walks with person assist Low fall risk   Focused Assessments Cardiac Assessment Handoff:  Cardiac Rhythm: Normal sinus rhythm Lab Results  Component Value Date   CKTOTAL 49 08/05/2015   CKMB 1.5 08/05/2015   TROPONINI 0.19 (H) 08/05/2015   Lab Results  Component Value Date   DDIMER 0.92 (H) 06/19/2019   Does the Patient currently have chest pain? No     R Recommendations: See Admitting Provider Note  Report given to:   Additional Notes:

## 2019-06-19 NOTE — ED Notes (Signed)
Pt has poor IV access and order placed for IV team.  Pt agrees to this approach and can wait for pain meds.  Continues to have no outward pain responses.  C/o pain at rest a 4.  Worse with inspiration.

## 2019-06-19 NOTE — ED Provider Notes (Signed)
[facility] EMERGENCY DEPARTMENT Provider Note   CSN: [id number] Arrival date & time: 06/19/19  0439     History   Chief Complaint Chief Complaint  Patient presents with  . Chest Pain    HPI Kimberly [name] is a 63 y.o. female.     HPI Patient reports that she developed chest pain at about 3 AM this morning.  She reports that she felt fine when she went to bed.  She has not had any fever.  She reports the pain is fairly sharp and stabbing she indicates her left anterior chest just below the breast.  She reports it is worse with a deep breath or movements.  Patient reports she has had a "chronic cough" for many years.  She does see pulmonology.  She reports there is been no change in terms of productive cough or hemoptysis.  Patient reports for several weeks she has felt slightly more short of breath and believes this is because they are not taking enough fluid off of her at dialysis.  Patient has ESRD on dialysis from history of IgA nephropathy.  Patient would typically get dialyzed today but due to presenting to the emergency department with chest pain has missed today's session.  She has not missed prior sessions.  She has not had calf pain or lower extremity swelling. Past Medical History:  Diagnosis Date  . Anemia   . Asthma   . Complication of anesthesia    Slow to awaken and AMS- took 1 month to memory and recall to return  . Diabetes mellitus    medication induced with  steroids , 05/19/18 - last A1C was 4.something  . Diverticulosis   . Dyspnea    with exertion. when walking up stairs or walking a distance  . First degree AV block   . GERD (gastroesophageal reflux disease)   . Heart murmur    child  . History of blood transfusion   . History of chronic cough   . History of hiatal hernia   . History of hoarseness   . Hypertension    history; resolved on dialysis  . Hypothyroidism   . IgA nephropathy    ESRD - Hemo TTH Sat  . Immunosuppression  ([place])   . Kidney transplanted 01/2010   [facility], has had some rejection  . Patient on peritoneal dialysis ([place])   . Pneumonia   . Premature atrial beat   . Premature ventricular beat   . Renal transplant failure and rejection 2012  . Shingles 03/2011  . Stroke [facility]) October 2010, March 2013   cerebral aneurysm, 07/2017    Patient Active Problem List   Diagnosis Date Noted  . Displaced fracture of left femoral neck ([place]) 02/06/2019  . Closed left hip fracture, initial encounter ([place]) 02/05/2019  . History of stroke 02/05/2019  . IgA nephropathy with nephrotic syndrome 10/30/2017  . Snoring 10/30/2017  . Excessive daytime sleepiness 10/30/2017  . Complaint related to dreams 10/30/2017  . Acute embolic stroke ([place]) 65/99/3570  . Pre-transplant evaluation for kidney transplant 05/08/2017  . Laryngopharyngeal reflux (LPR) 05/29/2016  . Cough, persistent 05/29/2016  . Chronic cough 09/15/2015  . Paroxysmal atrial fibrillation ([place]) 08/21/2015  . Acute pericarditis 08/05/2015  . Elevated troponin 08/05/2015  . Pain in the chest   . Atypical chest pain 08/04/2015  . LUL cavitary lesion post VATS 2015-cx neg 09/01/2014  . Pulmonary infiltrates 07/05/2014  . Gross hematuria 06/30/2014  . Kidney transplant failure 06/28/2014  .  Renal failure 06/28/2014  . Peritoneal dialysis catheter in place March 2015 01/14/2014  . Ventral hernia s/p lap repair w mesh March 2015 01/14/2014  . Secondary hyperparathyroidism (of renal origin) 04/20/2013  . ESRD (end stage renal disease) ([place]) 10/28/2012  . Diarrhea, unspecified 08/10/2012  . Urinary tract infection 08/10/2012  . Reactive airway disease 03/13/2012  . Pulmonary nodule 03/13/2012  . Stroke/cerebrovascular accident ([place]) 03/13/2012  . Vitamin D deficiency, unspecified 03/13/2012  . PVC's (premature ventricular contractions) 01/17/2012  . Stroke ([place]) 11/29/2011  . DM type 2 causing complication ([place]) 41/63/8453  . S/P kidney transplant    . IgA nephropathy   . GERD (gastroesophageal reflux disease) 12/07/2007  . Cough variant asthma vs uacs vs components of both  11/09/2007  . Hypothyroidism, unspecified 10/12/2007  . ANEMIA 10/12/2007  . HTN (hypertension) 10/12/2007  . CARDIAC ARRHYTHMIA 10/12/2007  . Asthma 10/12/2007  . CKD (chronic kidney disease) stage V requiring chronic dialysis ([place]) 10/12/2007    Past Surgical History:  Procedure Laterality Date  . AV FISTULA PLACEMENT Left 05/20/2018   Procedure: INSERTION OF ARTERIOVENOUS (AV) GORE-TEX GRAFT LEFT  ARM;  Surgeon: [name], MD;  Location: [facility];  Service: Vascular;  Laterality: Left;  . CAPD INSERTION  08/25/2008   open, Dr [name]  . CAPD INSERTION N/A 12/14/2013   Procedure: LAPAROSCOPIC INSERTION CONTINUOUS AMBULATORY PERITONEAL DIALYSIS  (CAPD) CATHETER;  Surgeon: [name], MD;  Location: [facility];  Service: General;  Laterality: N/A;  . CAPD REMOVAL  03/22/2010   Dr [name]  . COLONOSCOPY    . ESOPHAGOGASTRODUODENOSCOPY    . EYE SURGERY Bilateral    radial keratotomy  . Hemodialysis catheter Right 02/13/2018  . INSERTION OF MESH N/A 12/14/2013   Procedure: INSERTION OF MESH;  Surgeon: [name], MD;  Location: [facility];  Service: General;  Laterality: N/A;  . KIDNEY TRANSPLANT Right 01/2010   [facility]  . LAPAROSCOPIC LYSIS OF ADHESIONS N/A 12/14/2013   Procedure: LAPAROSCOPIC LYSIS OF ADHESIONS;  Surgeon: [name], MD;  Location: [facility];  Service: General;  Laterality: N/A;  . TOTAL HIP ARTHROPLASTY Left 02/06/2019   Procedure: TOTAL HIP ARTHROPLASTY ANTERIOR APPROACH;  Surgeon: Rod Can, MD;  Location: [facility];  Service: Orthopedics;  Laterality: Left;  . UMBILICAL HERNIA REPAIR  10/25/2009   open with mesh,  Dr [name]  . URETER REVISION  04/2011   [facility]  . VENTRAL HERNIA REPAIR N/A 12/14/2013   Procedure: LAPAROSCOPIC VENTRAL HERNIA;  Surgeon: [name], MD;  Location: [facility];  Service: General;  Laterality: N/A;   . VIDEO ASSISTED THORACOSCOPY (VATS)/WEDGE RESECTION Left 09/01/2014   Procedure: VIDEO ASSISTED THORACOSCOPY (VATS)/WEDGE RESECTION;  Surgeon: [name], MD;  Location: [facility];  Service: Thoracic;  Laterality: Left;  Marland Kitchen VIDEO BRONCHOSCOPY Bilateral 07/05/2014   Procedure: VIDEO BRONCHOSCOPY WITH FLUORO;  Surgeon: [name], MD;  Location: [facility];  Service: Cardiopulmonary;  Laterality: Bilateral;     OB History   No obstetric history on file.      Home Medications    Prior to Admission medications   Medication Sig Start Date End Date Taking? Authorizing Provider  amLODipine (NORVASC) 5 MG tablet Take 5 mg by mouth at bedtime.    [provider]  aspirin EC 325 MG tablet Take 1 tablet (325 mg total) by mouth 2 (two) times a day. 02/09/19   Domenic Polite, MD  cinacalcet (SENSIPAR) 30 MG tablet Take 30 mg by mouth  daily after supper.    [provider]  EPOETIN ALFA IJ Inject into the skin See admin instructions. Inject  subcutaneously at dialysis as needed for low hemoglobin    [provider]  fluticasone furoate-vilanterol (BREO ELLIPTA) 200-25 MCG/INH AEPB Inhale 1 puff into the lungs every evening.    [provider]  HYDROcodone-acetaminophen (NORCO/VICODIN) 5-325 MG tablet Take 1 tablet by mouth every 4 (four) hours as needed for moderate pain. 02/09/19   Swinteck, Aaron Edelman, MD  hydroxychloroquine (PLAQUENIL) 200 MG tablet Take 200 mg by mouth daily with lunch.    [provider]  lanthanum (FOSRENOL) 1000 MG chewable tablet Chew 1,000 mg by mouth 3 (three) times daily with meals.    [provider]  levothyroxine (SYNTHROID, LEVOTHROID) 50 MCG tablet Take 50-75 mcg by mouth See admin instructions. Take  1 1/2 tablets (75 mcg) by mouth daily on Tuesday and Thursday nights, take 1 tablet (50 mcg) by mouth daily on all other nights of the week    [provider]  [place] Take 4 capsules by  mouth 2 (two) times a day. Juice plus - 1 fruit, 1 vegetable, 1 berries, 1 omega - twice daily    [provider]  PSYLLIUM PO Take 3 capsules by mouth every evening.    [provider]  senna-docusate (SENOKOT-S) 8.6-50 MG tablet Take 1 tablet by mouth at bedtime as needed for mild constipation. 02/09/19   Domenic Polite, MD  temazepam (RESTORIL) 15 MG capsule Take 15 mg by mouth at bedtime as needed for sleep.    [provider]  Tiotropium Bromide Monohydrate (SPIRIVA RESPIMAT) 1.25 MCG/ACT AERS Inhale 2 puffs into the lungs daily.     [provider]    Family History Family History  Problem Relation Age of Onset  . Coronary artery disease Mother   . Emphysema Mother        smoked  . Ovarian cancer Mother   . Heart disease Mother   . Stroke Maternal Uncle   . Diabetes Maternal Uncle   . Kidney disease Maternal Uncle   . Liver disease Father     Social History Social History   Tobacco Use  . Smoking status: Never Smoker  . Smokeless tobacco: Never Used  Substance Use Topics  . Alcohol use: Yes    Comment: ocassional- not weekly  . Drug use: No     Allergies   Mircera [methoxy polyethylene glycol-epoetin beta], Dapsone, Pregabalin, Sulfonamide derivatives, and Tramadol   Review of Systems Review of Systems 10 Systems reviewed and are negative for acute change except as noted in the HPI.   Physical Exam Updated Vital Signs BP (!) 161/74   Pulse 70   Temp 99 F (37.2 C) (Oral)   Resp 16   Ht 5\' 6"  (1.676 m)   Wt 50.8 kg   SpO2 99%   BMI 18.08 kg/m   Physical Exam Constitutional:      Appearance: Normal appearance.     Comments: Patient is alert and nontoxic.  She does not have respiratory distress.  She does appear to be in pain but is otherwise clinically well in appearance.  HENT:     Head: Normocephalic and atraumatic.  Eyes:     Extraocular Movements: Extraocular movements intact.  Cardiovascular:     Rate and  Rhythm: Normal rate and regular rhythm.     Heart sounds: Murmur present.     Comments: 2\6 systolic ejection murmur  heard best at the right upper sternal border.  No chest wall pain to palpation.  No pain in the breast tissue to palpation.  No rashes or lesions on the chest wall. Abdominal:     General: There is no distension.     Palpations: Abdomen is soft.     Tenderness: There is no abdominal tenderness. There is no guarding.  Musculoskeletal: Normal range of motion.        General: No swelling or tenderness.     Right lower leg: No edema.     Left lower leg: No edema.  Skin:    General: Skin is warm and dry.  Neurological:     General: No focal deficit present.     Mental Status: She is alert and oriented to person, place, and time.     Coordination: Coordination normal.  Psychiatric:        Mood and Affect: Mood normal.      ED Treatments / Results  Labs (all labs ordered are listed, but only abnormal results are displayed) Labs Reviewed  BASIC METABOLIC PANEL - Abnormal; Notable for the following components:      Result Value   Potassium 3.3 (*)    Chloride 94 (*)    BUN 28 (*)    Creatinine, Ser 5.64 (*)    GFR calc non Af Amer 7 (*)    GFR calc Af Amer 9 (*)    All other components within normal limits  CBC - Abnormal; Notable for the following components:   RBC 3.20 (*)    Hemoglobin 9.7 (*)    HCT 30.9 (*)    All other components within normal limits  TROPONIN I (HIGH SENSITIVITY)  TROPONIN I (HIGH SENSITIVITY)    EKG EKG Interpretation  Date/Time:  Saturday June 19 2019 04:45:43 EDT Ventricular Rate:  67 PR Interval:  190 QRS Duration: 98 QT Interval:  432 QTC Calculation: 456 R Axis:   117 Text Interpretation:  significant artifact, needs repeat EKG Confirmed by Ripley Fraise [phone]) on 06/19/2019 4:56:39 AM   Radiology Dg Chest 2 View  Result Date: 06/19/2019 CLINICAL DATA:  Initial evaluation for acute chest pain, chronic dry cough.  EXAM: CHEST - 2 VIEW COMPARISON:  Prior radiograph from 02/10/2018. FINDINGS: Mild cardiomegaly. Mediastinal silhouette within normal limits. Aortic atherosclerosis. Lungs well inflated with chronic coarsening of the interstitial markings. No focal infiltrates. Partial obscuration of the left hemidiaphragm most likely due to mediastinal fat. No edema or effusion. No pneumothorax. Suture line noted at the left lung apex. No acute osseous finding. IMPRESSION: Chronic coarsening of the interstitial markings with no active cardiopulmonary disease. Electronically Signed   By: Jeannine Boga M.D.   On: 06/19/2019 07:37    Procedures Procedures (including critical care time)  Medications Ordered in ED Medications  sodium chloride flush (NS) 0.9 % injection 3 mL (3 mLs Intravenous Not Given 06/19/19 0831)     Initial Impression / Assessment and Plan / ED Course  I have reviewed the triage vital signs and the nursing notes.  Pertinent labs & imaging results that were available during my care of the patient were reviewed by me and considered in my medical decision making (see chart for details).       Consult: Reviewed with Dr. Henrietta Dine.  Will admit for dialysis and chest pain. Consult: Reviewed with Dr. Lorin Mercy for admission.  Patient presents with chest pain that has pleuritic quality to it.  It is worse with inspiration and  movements but not reproducible chest wall palpation or soft tissues of the breast or chest wall.  Patient reports she has significant contrast allergy and it has made her sick for days when she last had it.  She refuses IV contrast for CT scan.  Lower extremity ultrasounds obtained which do not show DVT.  D-dimer is mildly elevated but not significantly so.  Patient does not have tachycardia or hypoxia.  At this point, based on available clinical and diagnostic information I feel that this is low probability to represent a PE.  Patient's troponin is not elevated and EKG does  not show acute ischemic pattern.  Quality of pain and duration are not consistent with ischemia.  Patient does however feel that her shortness of breath has worsened and she will not be able to make it until Monday or Tuesday until she can be dialyzed without becoming severely short of breath.  Plan at this time will be to admit for dialysis which will likely occur tomorrow and monitoring and observation for unspecified pleuritic type chest pain.  Final Clinical Impressions(s) / ED Diagnoses   Final diagnoses:  Pleurisy  ESRD needing dialysis [facility])    ED Discharge Orders    None       Charlesetta Shanks, MD 06/19/19 1627

## 2019-06-19 NOTE — ED Notes (Signed)
Nurse unable to receive report at this time. Will call back shortly

## 2019-06-19 NOTE — ED Triage Notes (Signed)
Patient c/o left anterior chest pain onset 3 am states it was sharp and stabbing pain , states pain is worse with inspiration . C/o chronic dry cough for 20 years.

## 2019-06-20 DIAGNOSIS — E119 Type 2 diabetes mellitus without complications: Secondary | ICD-10-CM | POA: Diagnosis not present

## 2019-06-20 DIAGNOSIS — R0789 Other chest pain: Secondary | ICD-10-CM | POA: Diagnosis not present

## 2019-06-20 DIAGNOSIS — E039 Hypothyroidism, unspecified: Secondary | ICD-10-CM | POA: Diagnosis not present

## 2019-06-20 DIAGNOSIS — N186 End stage renal disease: Secondary | ICD-10-CM | POA: Diagnosis not present

## 2019-06-20 NOTE — Progress Notes (Signed)
[facility] Progress Note   Subjective:   Patient seen in room. Feeling well, still with some CP with deep inspiration but pain is primarily localized to the breast. Denies SOB, dyspnea, cough, palpitations, abdominal pain, N/V/D.   Objective Vitals:   06/19/19 1759 06/19/19 2029 06/20/19 0705 06/20/19 0759  BP: (!) 183/76 (!) 164/74 (!) 173/77   Pulse: 62 63 69   Resp: (!) 22 20 20    Temp: 98 F (36.7 C) 98.9 F (37.2 C) 98.7 F (37.1 C)   TempSrc: Oral Oral Oral   SpO2: 99% 94% 95% 95%  Weight: 51.9 kg  51.8 kg   Height: 5\' 6"  (1.676 m)      Physical Exam General: Well developed, alert female in NAD Heart: RRR, no murmurs, rubs or gallops Lungs: CTA b/l without wheezing, rhonchi or rales Abdomen: Soft nontender nondistended +BS Extremities: No peripheral edema Dialysis Access: LUE AVG + bruit  Additional Objective Labs: Basic Metabolic Panel: Recent Labs  Lab 06/19/19 0531  NA 135  K 3.3*  CL 94*  CO2 28  GLUCOSE 86  BUN 28*  CREATININE 5.64*  CALCIUM 9.8   CBC: Recent Labs  Lab 06/19/19 0531  WBC 4.0  HGB 9.7*  HCT 30.9*  MCV 96.6  PLT 264   Blood Culture    Component Value Date/Time   SDES  02/10/2018 0414    BLOOD LEFT FOREARM Performed at [facility], [place] [address], [place], [place] [address]    SPECREQUEST  02/10/2018 0414    BOTTLES DRAWN AEROBIC ONLY Blood Culture adequate volume Performed at [facility] [address], [place], [place] [address]    CULT  02/10/2018 0414    NO GROWTH 5 DAYS Performed at [facility], [place] [address] [place], [place] [address]    REPTSTATUS 02/15/2018 FINAL 02/10/2018 0414    Studies/Results: Dg Chest 2 View  Result Date: 06/19/2019 CLINICAL DATA:  Initial evaluation for acute chest pain, chronic dry cough. EXAM: CHEST - 2 VIEW COMPARISON:  Prior radiograph from 02/10/2018. FINDINGS: Mild cardiomegaly. Mediastinal silhouette within normal  limits. Aortic atherosclerosis. Lungs well inflated with chronic coarsening of the interstitial markings. No focal infiltrates. Partial obscuration of the left hemidiaphragm most likely due to mediastinal fat. No edema or effusion. No pneumothorax. Suture line noted at the left lung apex. No acute osseous finding. IMPRESSION: Chronic coarsening of the interstitial markings with no active cardiopulmonary disease. Electronically Signed   By: Jeannine Boga M.D.   On: 06/19/2019 07:37   Ct Chest Wo Contrast  Result Date: 06/19/2019 CLINICAL DATA:  Chest pain and shortness of breath. EXAM: CT CHEST WITHOUT CONTRAST TECHNIQUE: Multidetector CT imaging of the chest was performed following the standard protocol without IV contrast. COMPARISON:  10/29/2017.  CT urogram from 08/26/2012 FINDINGS: Cardiovascular: Heart size upper normal to mildly increased. No substantial pericardial effusion. Coronary artery calcification is evident. Atherosclerotic calcification is noted in the wall of the thoracic aorta. Mediastinum/Nodes: No mediastinal lymphadenopathy. No evidence for gross hilar lymphadenopathy although assessment is limited by the lack of intravenous contrast on today's study. The esophagus has normal imaging features. There is no axillary lymphadenopathy. Lungs/Pleura: Areas of clustered micro nodularity are identified in the lungs bilaterally, some of which appear mineralized period imaging appearance is similar to prior. Staple line noted left apex, unchanged. 12 mm right middle lobe pulmonary nodule (99/8) is stable. Previously described elongated right middle lobe pulmonary nodule has resolved in the  interval. Mosaic attenuation is noted in the lungs bilaterally today, nonspecific but likely related to air trapping. No dense focal airspace consolidation. No pleural effusion. Upper Abdomen: There is free fluid in the abdomen, seen anterior to the liver and spleen. Cystic lesion identified in the posterior  right upper abdomen represents a markedly hydronephrotic native right kidney with thinning of the overlying cortex, similar to the 2013 exam. Left kidney incompletely visualized but markedly atrophic, as seen previously. Musculoskeletal: No worrisome lytic or sclerotic osseous abnormality. IMPRESSION: 1. Areas of clustered micro nodularity in the central lungs bilaterally with associated mineralization in some regions. Overall appearance is not substantially changed in suggests chronic/indolent atypical infection (including MAI). 2. Dominant 12 mm right middle lobe pulmonary nodule measured on the previous exam is stable. Close follow-up recommended. 3. Interval development of mosaic attenuation in the lungs bilaterally. This is a nonspecific finding but can be seen in the setting of small airway disease/air trapping. 4. Ascites again noted. Electronically Signed   By: Misty Stanley M.D.   On: 06/19/2019 10:41   Vas [place] Lower Extremity Venous (dvt) (only Mc & Wl 7a-7p)  Result Date: 06/20/2019  Lower Venous Study Indications: Chest pain, and Swelling.  Comparison Study: No prior study on file for comparison Performing Technologist: Sharion Dove RVS  Examination Guidelines: A complete evaluation includes B-mode imaging, spectral Doppler, color Doppler, and power Doppler as needed of all accessible portions of each vessel. Bilateral testing is considered an integral part of a complete examination. Limited examinations for reoccurring indications may be performed as noted.  +---------+---------------+---------+-----------+----------+--------------+ RIGHT    CompressibilityPhasicitySpontaneityPropertiesThrombus Aging +---------+---------------+---------+-----------+----------+--------------+ CFV      Full           Yes      Yes                                 +---------+---------------+---------+-----------+----------+--------------+ SFJ      Full                                                         +---------+---------------+---------+-----------+----------+--------------+ FV Prox  Full                                                        +---------+---------------+---------+-----------+----------+--------------+ FV Mid   Full                                                        +---------+---------------+---------+-----------+----------+--------------+ FV DistalFull                                                        +---------+---------------+---------+-----------+----------+--------------+ PFV      Full                                                        +---------+---------------+---------+-----------+----------+--------------+  POP      Full           Yes      Yes                                 +---------+---------------+---------+-----------+----------+--------------+ PTV      Full                                                        +---------+---------------+---------+-----------+----------+--------------+ PERO     Full                                                        +---------+---------------+---------+-----------+----------+--------------+   +---------+---------------+---------+-----------+----------+--------------+ LEFT     CompressibilityPhasicitySpontaneityPropertiesThrombus Aging +---------+---------------+---------+-----------+----------+--------------+ CFV      Full           Yes      Yes                                 +---------+---------------+---------+-----------+----------+--------------+ SFJ      Full                                                        +---------+---------------+---------+-----------+----------+--------------+ FV Prox  Full                                                        +---------+---------------+---------+-----------+----------+--------------+ FV Mid   Full                                                         +---------+---------------+---------+-----------+----------+--------------+ FV DistalFull                                                        +---------+---------------+---------+-----------+----------+--------------+ PFV      Full                                                        +---------+---------------+---------+-----------+----------+--------------+ POP      Full           Yes      Yes                                 +---------+---------------+---------+-----------+----------+--------------+  PTV      Full                                                        +---------+---------------+---------+-----------+----------+--------------+ PERO     Full                                                        +---------+---------------+---------+-----------+----------+--------------+     Summary: Right: There is no evidence of deep vein thrombosis in the lower extremity. Left: There is no evidence of deep vein thrombosis in the lower extremity.  *See table(s) above for measurements and observations. Electronically signed by Curt Jews MD on 06/20/2019 at 9:51:43 AM.    Final    Medications:  . amLODipine  5 mg Oral QHS  . aspirin EC  325 mg Oral BID  . calcitRIOL  0.75 mcg Oral Q T,Th,Sa-HD  . calcium acetate  667 mg Oral TID WC  . Chlorhexidine Gluconate Cloth  6 each Topical Q0600  . cinacalcet  30 mg Oral Q supper  . fluticasone furoate-vilanterol  1 puff Inhalation QPM  . heparin  5,000 Units Subcutaneous Q8H  . hydroxychloroquine  200 mg Oral Q lunch  . levothyroxine  50 mcg Oral Once per day on Sun Mon Wed Fri Sat  . [START ON 06/22/2019] levothyroxine  75 mcg Oral Once per day on Tue Thu  . sodium chloride flush  3 mL Intravenous Once  . umeclidinium bromide  1 puff Inhalation Daily    Dialysis Orders: Center: [facility]  on TTS. Time 4 hours, 180NRe, EDW 52kg (left at 51.2kg last treatment), BFR 400/DFR 800, 2K/2Ca, LUE AVG Heparin  5500 units Epogen 20000 units q week (last dose Calcitriol 0.107mcg PO q HD Calcium acetate 2 tabs PO TID with meals Fosrenol 1 tab PO TID with meals Sensipar 30mg  PO QD  Assessment/Plan: 1.  Chest pain: Occurs only with deep inspiration. CTA shows stable lung disease. D dimer minimally elevated and preliminary [place] without evidence of DVT. Suspect musculoskeletal component. Further management per primary team. 2.  ESRD:  TTS schedule, missed HD on 9/26 and planned for make up treatment today. K+ 3.3, will use 4K bath. If stable, ok to discharge post-HD and resume regular TTS schedule at her out patient clinic.  3.  Hypertension/volume: BP elevated, presenting below her EDW. Dry weight recently lowered at outpatient unit. Endorses unchanged dyspnea on exertion but currently comfortable on room air. UFG 2-2.5L with dialysis, will need EDW lowered at discharge.  4.  Anemia: Hemoglobin 9.7. On weekly aranesp (allergy to mircera) 5.  Metabolic bone disease: Calcium 9.8. Continue calcitriol, sensipar and outpatient binders.  6.  Nutrition:  Recommend renal diet with fluid restrictions.  Anice Paganini, PA-C 06/20/2019, 11:45 AM  [facility] Pager: [phone]

## 2019-06-20 NOTE — Plan of Care (Signed)
  Problem: Education: Goal: Knowledge of General Education information will improve Description: Including pain rating scale, medication(s)/side effects and non-pharmacologic comfort measures Outcome: Adequate for Discharge   

## 2019-06-20 NOTE — Discharge Summary (Signed)
Physician Discharge Summary  Kimberly [name] CXK:[id number] DOB: 1956-02-15 DOA: 06/19/2019  PCP: [name], MD  Admit date: 06/19/2019 Discharge date: 06/20/2019  Admitted From: Home  Disposition:  Home   Recommendations for Outpatient Follow-up:  1. 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 F with hx ESRD on HD TThS, hypothyroidism, HTN, DM, history of CVA, who presented with left-sided chest pain.     PRINCIPAL HOSPITAL DIAGNOSIS: Chest wall pain    Discharge Diagnoses:  Chest wall pain On repeat telling her story, patient able to articulate clearly that this has been a nonexertional, constant pain for several weeks, worsened with inspiration, improved with pressure on the rib cage at a specific point.   Troponins were negative.  D-dimer was slightly elevated, but PE was doubted.  CT of the chest without contrast was unremarkable.    The pain was reproducible on my exam, and she was comfortable with minimal discomfort.  Underwent HD in the hospital, and discharged after.             Discharge Instructions  Discharge Instructions    Discharge instructions   Complete by: As directed    From Dr. Loleta Books: You were evaluated for chest pain. Your lab tests, EKG and imaging tests showed no findings to suggest heart attack, pneumonia, or blood clots, nor anything else concerning to explain the pain in your chest.  Combined with the fact that it gets better when you press the right spot on your ribs, I suspect it is muscle pain that will respond well to a heat pad, Tylenol, or stretching.  Follow up for your regular dialysis on Tuesday, ask Dr. Jimmy Footman at that time about your blood pressure and dry weight   Increase activity slowly   Complete by: As directed      Allergies as of 06/20/2019       Reactions   Mircera [methoxy Polyethylene Glycol-epoetin Beta] Other (See Comments)   Severe joint and muscle pain   Dapsone Rash, Other (See Comments)   Pain in feet   Pregabalin Rash, Other (See Comments)   Fever - reaction to Lyrica   Sulfonamide Derivatives Rash   Tramadol Rash, Other (See Comments)   Rash on cheek      Medication List    TAKE these medications   amLODipine 5 MG tablet Commonly known as: NORVASC Take 5 mg by mouth at bedtime.   aspirin EC 325 MG tablet Take 1 tablet (325 mg total) by mouth 2 (two) times a day. What changed: when to take this   Breo Ellipta 200-25 MCG/INH Aepb Generic drug: fluticasone furoate-vilanterol Inhale 1 puff into the lungs every evening.   calcium acetate 667 MG capsule Commonly known as: PHOSLO Take 667 mg by mouth 3 (three) times daily with meals.   cinacalcet 30 MG tablet Commonly known as: SENSIPAR Take 30 mg by mouth daily after supper.   EPOETIN ALFA IJ Inject into the skin See admin instructions. Inject  subcutaneously at dialysis as needed for low hemoglobin   hydroxychloroquine 200 MG tablet Commonly known as: PLAQUENIL Take 200 mg by mouth daily with lunch.   levothyroxine 50 MCG tablet Commonly known as: SYNTHROID Take 50-75 mcg by mouth See admin instructions. Take  1 1/2 tablets (75 mcg) by mouth daily on Tuesday and Thursday nights, take 1 tablet (50  mcg) by mouth daily on all other nights of the week   megestrol 40 MG/ML suspension Commonly known as: MEGACE Take 200 mg by mouth 2 (two) times daily.   OVER THE COUNTER MEDICATION Take 4 capsules by mouth 2 (two) times a day. Juice plus - 1 fruit, 1 vegetable, 1 berries, 1 omega - twice daily   PSYLLIUM PO Take 3 capsules by mouth daily at 2 PM.   Spiriva Respimat 1.25 MCG/ACT Aers Generic drug: Tiotropium Bromide Monohydrate Inhale 2 puffs into the lungs daily.   temazepam 15 MG capsule Commonly known as: RESTORIL Take 15 mg by mouth at bedtime  as needed for sleep.       Allergies  Allergen Reactions  . Mircera [Methoxy Polyethylene Glycol-Epoetin Beta] Other (See Comments)    Severe joint and muscle pain  . Dapsone Rash and Other (See Comments)    Pain in feet  . Pregabalin Rash and Other (See Comments)    Fever - reaction to Lyrica  . Sulfonamide Derivatives Rash  . Tramadol Rash and Other (See Comments)    Rash on cheek    Consultations:  nephrology   Procedures/Studies: Dg Chest 2 View  Result Date: 06/19/2019 CLINICAL DATA:  Initial evaluation for acute chest pain, chronic dry cough. EXAM: CHEST - 2 VIEW COMPARISON:  Prior radiograph from 02/10/2018. FINDINGS: Mild cardiomegaly. Mediastinal silhouette within normal limits. Aortic atherosclerosis. Lungs well inflated with chronic coarsening of the interstitial markings. No focal infiltrates. Partial obscuration of the left hemidiaphragm most likely due to mediastinal fat. No edema or effusion. No pneumothorax. Suture line noted at the left lung apex. No acute osseous finding. IMPRESSION: Chronic coarsening of the interstitial markings with no active cardiopulmonary disease. Electronically Signed   By: Jeannine Boga M.D.   On: 06/19/2019 07:37   Ct Chest Wo Contrast  Result Date: 06/19/2019 CLINICAL DATA:  Chest pain and shortness of breath. EXAM: CT CHEST WITHOUT CONTRAST TECHNIQUE: Multidetector CT imaging of the chest was performed following the standard protocol without IV contrast. COMPARISON:  10/29/2017.  CT urogram from 08/26/2012 FINDINGS: Cardiovascular: Heart size upper normal to mildly increased. No substantial pericardial effusion. Coronary artery calcification is evident. Atherosclerotic calcification is noted in the wall of the thoracic aorta. Mediastinum/Nodes: No mediastinal lymphadenopathy. No evidence for gross hilar lymphadenopathy although assessment is limited by the lack of intravenous contrast on today's study. The esophagus has normal imaging  features. There is no axillary lymphadenopathy. Lungs/Pleura: Areas of clustered micro nodularity are identified in the lungs bilaterally, some of which appear mineralized period imaging appearance is similar to prior. Staple line noted left apex, unchanged. 12 mm right middle lobe pulmonary nodule (99/8) is stable. Previously described elongated right middle lobe pulmonary nodule has resolved in the interval. Mosaic attenuation is noted in the lungs bilaterally today, nonspecific but likely related to air trapping. No dense focal airspace consolidation. No pleural effusion. Upper Abdomen: There is free fluid in the abdomen, seen anterior to the liver and spleen. Cystic lesion identified in the posterior right upper abdomen represents a markedly hydronephrotic native right kidney with thinning of the overlying cortex, similar to the 2013 exam. Left kidney incompletely visualized but markedly atrophic, as seen previously. Musculoskeletal: No worrisome lytic or sclerotic osseous abnormality. IMPRESSION: 1. Areas of clustered micro nodularity in the central lungs bilaterally with associated mineralization in some regions. Overall appearance is not substantially changed in suggests chronic/indolent atypical infection (including MAI). 2. Dominant 12 mm right middle lobe pulmonary nodule  measured on the previous exam is stable. Close follow-up recommended. 3. Interval development of mosaic attenuation in the lungs bilaterally. This is a nonspecific finding but can be seen in the setting of small airway disease/air trapping. 4. Ascites again noted. Electronically Signed   By: Misty Stanley M.D.   On: 06/19/2019 10:41   Vas [place] Lower Extremity Venous (dvt) (only Mc & Wl 7a-7p)  Result Date: 06/20/2019  Lower Venous Study Indications: Chest pain, and Swelling.  Comparison Study: No prior study on file for comparison Performing Technologist: Sharion Dove RVS  Examination Guidelines: A complete evaluation includes B-mode  imaging, spectral Doppler, color Doppler, and power Doppler as needed of all accessible portions of each vessel. Bilateral testing is considered an integral part of a complete examination. Limited examinations for reoccurring indications may be performed as noted.  +---------+---------------+---------+-----------+----------+--------------+ RIGHT    CompressibilityPhasicitySpontaneityPropertiesThrombus Aging +---------+---------------+---------+-----------+----------+--------------+ CFV      Full           Yes      Yes                                 +---------+---------------+---------+-----------+----------+--------------+ SFJ      Full                                                        +---------+---------------+---------+-----------+----------+--------------+ FV Prox  Full                                                        +---------+---------------+---------+-----------+----------+--------------+ FV Mid   Full                                                        +---------+---------------+---------+-----------+----------+--------------+ FV DistalFull                                                        +---------+---------------+---------+-----------+----------+--------------+ PFV      Full                                                        +---------+---------------+---------+-----------+----------+--------------+ POP      Full           Yes      Yes                                 +---------+---------------+---------+-----------+----------+--------------+ PTV      Full                                                        +---------+---------------+---------+-----------+----------+--------------+  PERO     Full                                                        +---------+---------------+---------+-----------+----------+--------------+   +---------+---------------+---------+-----------+----------+--------------+ LEFT      CompressibilityPhasicitySpontaneityPropertiesThrombus Aging +---------+---------------+---------+-----------+----------+--------------+ CFV      Full           Yes      Yes                                 +---------+---------------+---------+-----------+----------+--------------+ SFJ      Full                                                        +---------+---------------+---------+-----------+----------+--------------+ FV Prox  Full                                                        +---------+---------------+---------+-----------+----------+--------------+ FV Mid   Full                                                        +---------+---------------+---------+-----------+----------+--------------+ FV DistalFull                                                        +---------+---------------+---------+-----------+----------+--------------+ PFV      Full                                                        +---------+---------------+---------+-----------+----------+--------------+ POP      Full           Yes      Yes                                 +---------+---------------+---------+-----------+----------+--------------+ PTV      Full                                                        +---------+---------------+---------+-----------+----------+--------------+ PERO     Full                                                        +---------+---------------+---------+-----------+----------+--------------+       Summary: Right: There is no evidence of deep vein thrombosis in the lower extremity. Left: There is no evidence of deep vein thrombosis in the lower extremity.  *See table(s) above for measurements and observations. Electronically signed by Curt Jews MD on 06/20/2019 at 9:51:43 AM.    Final       Subjective: Feeling well.  Chest pain unchanged.  No vomiting, no dyspnea at rest, no hemoptysis, no leg swelling.  Discharge  Exam: Vitals:   06/20/19 1430 06/20/19 1502  BP: 135/69 127/70  Pulse: (!) 56 60  Resp: 18 18  Temp:  98.4 F (36.9 C)  SpO2:  98%   Vitals:   06/20/19 1400 06/20/19 1415 06/20/19 1430 06/20/19 1502  BP: 109/69 135/69 135/69 127/70  Pulse: 61 60 (!) 56 60  Resp: 18 18 18 18   Temp:    98.4 F (36.9 C)  TempSrc:    Oral  SpO2:    98%  Weight:    49 kg  Height:        General: Pt is alert, awake, not in acute distress Cardiovascular: RRR, nl S1-S2, no murmurs appreciated.   No LE edema.   Respiratory: Normal respiratory rate and rhythm.  CTAB without rales or wheezes. Abdominal: Abdomen soft and non-tender.  No distension or HSM.   Neuro/Psych: Strength symmetric in upper and lower extremities.  Judgment and insight appear normal.   The results of significant diagnostics from this hospitalization (including imaging, microbiology, ancillary and laboratory) are listed below for reference.     Microbiology: Recent Results (from the past 240 hour(s))  SARS Coronavirus 2 [facility] order, Performed in [facility] hospital lab) Nasopharyngeal Nasopharyngeal Swab     Status: None   Collection Time: 06/19/19  4:29 PM   Specimen: Nasopharyngeal Swab  Result Value Ref Range Status   SARS Coronavirus 2 NEGATIVE NEGATIVE Final    Comment: (NOTE) If result is NEGATIVE SARS-CoV-2 target nucleic acids are NOT DETECTED. The SARS-CoV-2 RNA is generally detectable in upper and lower  respiratory specimens during the acute phase of infection. The lowest  concentration of SARS-CoV-2 viral copies this assay can detect is 250  copies / mL. A negative result does not preclude SARS-CoV-2 infection  and should not be used as the sole basis for treatment or other  patient management decisions.  A negative result may occur with  improper specimen collection / handling, submission of specimen other  than nasopharyngeal swab, presence of viral mutation(s) within the  areas targeted by this assay,  and inadequate number of viral copies  (<250 copies / mL). A negative result must be combined with clinical  observations, patient history, and epidemiological information. If result is POSITIVE SARS-CoV-2 target nucleic acids are DETECTED. The SARS-CoV-2 RNA is generally detectable in upper and lower  respiratory specimens dur ing the acute phase of infection.  Positive  results are indicative of active infection with SARS-CoV-2.  Clinical  correlation with patient history and other diagnostic information is  necessary to determine patient infection status.  Positive results do  not rule out bacterial infection or co-infection with other viruses. If result is PRESUMPTIVE POSTIVE SARS-CoV-2 nucleic acids MAY BE PRESENT.   A presumptive positive result was obtained on the submitted specimen  and confirmed on repeat testing.  While 2019 novel coronavirus  (SARS-CoV-2) nucleic acids may be present in the submitted sample  additional confirmatory testing may be necessary for epidemiological  and / or clinical management purposes  to differentiate between  SARS-CoV-2 and other Sarbecovirus currently known to infect humans.  If clinically indicated additional testing with an alternate test  methodology [phone]) is advised. The SARS-CoV-2 RNA is generally  detectable in upper and lower respiratory sp ecimens during the acute  phase of infection. The expected result is Negative. Fact Sheet for Patients:  StrictlyIdeas.no Fact Sheet for Healthcare Providers: BankingDealers.co.za This test is not yet approved or cleared by the [place] FDA and has been authorized for detection and/or diagnosis of SARS-CoV-2 by FDA under an Emergency Use Authorization (EUA).  This EUA will remain in effect (meaning this test can be used) for the duration of the COVID-19 declaration under Section 564(b)(1) of the Act, 21 U.S.C. section 360bbb-3(b)(1), unless  the authorization is terminated or revoked sooner. Performed at [facility], [place] [address] [place], [place] [address]      Labs: BNP (last 3 results) No results for input(s): BNP in the last 8760 hours. Basic Metabolic Panel: Recent Labs  Lab 06/19/19 0531  NA 135  K 3.3*  CL 94*  CO2 28  GLUCOSE 86  BUN 28*  CREATININE 5.64*  CALCIUM 9.8   Liver Function Tests: No results for input(s): AST, ALT, ALKPHOS, BILITOT, PROT, ALBUMIN in the last 168 hours. No results for input(s): LIPASE, AMYLASE in the last 168 hours. No results for input(s): AMMONIA in the last 168 hours. CBC: Recent Labs  Lab 06/19/19 0531  WBC 4.0  HGB 9.7*  HCT 30.9*  MCV 96.6  PLT 264   Cardiac Enzymes: No results for input(s): CKTOTAL, CKMB, CKMBINDEX, TROPONINI in the last 168 hours. BNP: Invalid input(s): POCBNP CBG: No results for input(s): GLUCAP in the last 168 hours. D-Dimer Recent Labs    06/19/19 1030  DDIMER 0.92*   Hgb A1c No results for input(s): HGBA1C in the last 72 hours. Lipid Profile No results for input(s): CHOL, HDL, LDLCALC, TRIG, CHOLHDL, LDLDIRECT in the last 72 hours. Thyroid function studies Recent Labs    06/19/19 0531  TSH 2.365   Anemia work up No results for input(s): VITAMINB12, FOLATE, FERRITIN, TIBC, IRON, RETICCTPCT in the last 72 hours. Urinalysis    Component Value Date/Time   COLORURINE RED BIOCHEMICALS MAY BE AFFECTED BY COLOR (A) 01/03/2011 2311   APPEARANCEUR TURBID (A) 01/03/2011 2311   LABSPEC 1.013 01/03/2011 2311   PHURINE 6.0 01/03/2011 2311   GLUCOSEU 100 (A) 01/03/2011 2311   HGBUR LARGE (A) 01/03/2011 2311   BILIRUBINUR MODERATE (A) 01/03/2011 2311   KETONESUR 15 (A) 01/03/2011 2311   PROTEINUR >300 (A) 01/03/2011 2311   UROBILINOGEN 0.2 01/03/2011 2311   NITRITE POSITIVE (A) 01/03/2011 2311   LEUKOCYTESUR LARGE (A) 01/03/2011 2311   Sepsis Labs Invalid input(s): PROCALCITONIN,  WBC,  LACTICIDVEN Microbiology Recent  Results (from the past 240 hour(s))  SARS Coronavirus 2 [facility] order, Performed in [place] hospital lab) Nasopharyngeal Nasopharyngeal Swab     Status: None   Collection Time: 06/19/19  4:29 PM   Specimen: Nasopharyngeal Swab  Result Value Ref Range Status   SARS Coronavirus 2 NEGATIVE NEGATIVE Final    Comment: (NOTE) If result is NEGATIVE SARS-CoV-2 target nucleic acids are NOT DETECTED. The SARS-CoV-2 RNA is generally detectable in upper and lower  respiratory specimens during the acute phase of infection. The lowest  concentration of SARS-CoV-2 viral copies this assay can detect is 250  copies / mL. A negative result does not preclude SARS-CoV-2 infection  and should not be used as the sole  basis for treatment or other  patient management decisions.  A negative result may occur with  improper specimen collection / handling, submission of specimen other  than nasopharyngeal swab, presence of viral mutation(s) within the  areas targeted by this assay, and inadequate number of viral copies  (<250 copies / mL). A negative result must be combined with clinical  observations, patient history, and epidemiological information. If result is POSITIVE SARS-CoV-2 target nucleic acids are DETECTED. The SARS-CoV-2 RNA is generally detectable in upper and lower  respiratory specimens dur ing the acute phase of infection.  Positive  results are indicative of active infection with SARS-CoV-2.  Clinical  correlation with patient history and other diagnostic information is  necessary to determine patient infection status.  Positive results do  not rule out bacterial infection or co-infection with other viruses. If result is PRESUMPTIVE POSTIVE SARS-CoV-2 nucleic acids MAY BE PRESENT.   A presumptive positive result was obtained on the submitted specimen  and confirmed on repeat testing.  While 2019 novel coronavirus  (SARS-CoV-2) nucleic acids may be present in the submitted sample   additional confirmatory testing may be necessary for epidemiological  and / or clinical management purposes  to differentiate between  SARS-CoV-2 and other Sarbecovirus currently known to infect humans.  If clinically indicated additional testing with an alternate test  methodology [phone]) is advised. The SARS-CoV-2 RNA is generally  detectable in upper and lower respiratory sp ecimens during the acute  phase of infection. The expected result is Negative. Fact Sheet for Patients:  StrictlyIdeas.no Fact Sheet for Healthcare Providers: BankingDealers.co.za This test is not yet approved or cleared by the [place] FDA and has been authorized for detection and/or diagnosis of SARS-CoV-2 by FDA under an Emergency Use Authorization (EUA).  This EUA will remain in effect (meaning this test can be used) for the duration of the COVID-19 declaration under Section 564(b)(1) of the Act, 21 U.S.C. section 360bbb-3(b)(1), unless the authorization is terminated or revoked sooner. Performed at [facility], [place] [address], [place], [place] [address]      Time coordinating discharge: 25 minutes      SIGNED:   Edwin Dada, MD  [facility] 06/20/2019, 7:25 PM

## 2019-06-22 DIAGNOSIS — N186 End stage renal disease: Secondary | ICD-10-CM | POA: Diagnosis not present

## 2019-06-22 DIAGNOSIS — D631 Anemia in chronic kidney disease: Secondary | ICD-10-CM | POA: Diagnosis not present

## 2019-06-22 DIAGNOSIS — D689 Coagulation defect, unspecified: Secondary | ICD-10-CM | POA: Diagnosis not present

## 2019-06-22 DIAGNOSIS — N2581 Secondary hyperparathyroidism of renal origin: Secondary | ICD-10-CM | POA: Diagnosis not present

## 2019-06-22 DIAGNOSIS — E039 Hypothyroidism, unspecified: Secondary | ICD-10-CM | POA: Diagnosis not present

## 2019-06-22 DIAGNOSIS — Z992 Dependence on renal dialysis: Secondary | ICD-10-CM | POA: Diagnosis not present

## 2019-06-23 DIAGNOSIS — Z992 Dependence on renal dialysis: Secondary | ICD-10-CM | POA: Diagnosis not present

## 2019-06-23 DIAGNOSIS — T8612 Kidney transplant failure: Secondary | ICD-10-CM | POA: Diagnosis not present

## 2019-06-23 DIAGNOSIS — N186 End stage renal disease: Secondary | ICD-10-CM | POA: Diagnosis not present

## 2019-06-24 DIAGNOSIS — E1129 Type 2 diabetes mellitus with other diabetic kidney complication: Secondary | ICD-10-CM | POA: Diagnosis not present

## 2019-06-24 DIAGNOSIS — N186 End stage renal disease: Secondary | ICD-10-CM | POA: Diagnosis not present

## 2019-06-24 DIAGNOSIS — E039 Hypothyroidism, unspecified: Secondary | ICD-10-CM | POA: Diagnosis not present

## 2019-06-24 DIAGNOSIS — D509 Iron deficiency anemia, unspecified: Secondary | ICD-10-CM | POA: Diagnosis not present

## 2019-06-24 DIAGNOSIS — D689 Coagulation defect, unspecified: Secondary | ICD-10-CM | POA: Diagnosis not present

## 2019-06-24 DIAGNOSIS — D631 Anemia in chronic kidney disease: Secondary | ICD-10-CM | POA: Diagnosis not present

## 2019-06-24 DIAGNOSIS — Z992 Dependence on renal dialysis: Secondary | ICD-10-CM | POA: Diagnosis not present

## 2019-06-24 DIAGNOSIS — N2581 Secondary hyperparathyroidism of renal origin: Secondary | ICD-10-CM | POA: Diagnosis not present

## 2019-06-24 DIAGNOSIS — Z23 Encounter for immunization: Secondary | ICD-10-CM | POA: Diagnosis not present

## 2019-06-26 DIAGNOSIS — D509 Iron deficiency anemia, unspecified: Secondary | ICD-10-CM | POA: Diagnosis not present

## 2019-06-26 DIAGNOSIS — Z23 Encounter for immunization: Secondary | ICD-10-CM | POA: Diagnosis not present

## 2019-06-26 DIAGNOSIS — N2581 Secondary hyperparathyroidism of renal origin: Secondary | ICD-10-CM | POA: Diagnosis not present

## 2019-06-26 DIAGNOSIS — E039 Hypothyroidism, unspecified: Secondary | ICD-10-CM | POA: Diagnosis not present

## 2019-06-26 DIAGNOSIS — Z992 Dependence on renal dialysis: Secondary | ICD-10-CM | POA: Diagnosis not present

## 2019-06-26 DIAGNOSIS — D631 Anemia in chronic kidney disease: Secondary | ICD-10-CM | POA: Diagnosis not present

## 2019-06-26 DIAGNOSIS — N186 End stage renal disease: Secondary | ICD-10-CM | POA: Diagnosis not present

## 2019-06-26 DIAGNOSIS — E1129 Type 2 diabetes mellitus with other diabetic kidney complication: Secondary | ICD-10-CM | POA: Diagnosis not present

## 2019-06-26 DIAGNOSIS — D689 Coagulation defect, unspecified: Secondary | ICD-10-CM | POA: Diagnosis not present

## 2019-06-29 DIAGNOSIS — D509 Iron deficiency anemia, unspecified: Secondary | ICD-10-CM | POA: Diagnosis not present

## 2019-06-29 DIAGNOSIS — N186 End stage renal disease: Secondary | ICD-10-CM | POA: Diagnosis not present

## 2019-06-29 DIAGNOSIS — D689 Coagulation defect, unspecified: Secondary | ICD-10-CM | POA: Diagnosis not present

## 2019-06-29 DIAGNOSIS — D631 Anemia in chronic kidney disease: Secondary | ICD-10-CM | POA: Diagnosis not present

## 2019-06-29 DIAGNOSIS — Z23 Encounter for immunization: Secondary | ICD-10-CM | POA: Diagnosis not present

## 2019-06-29 DIAGNOSIS — Z992 Dependence on renal dialysis: Secondary | ICD-10-CM | POA: Diagnosis not present

## 2019-06-29 DIAGNOSIS — E1129 Type 2 diabetes mellitus with other diabetic kidney complication: Secondary | ICD-10-CM | POA: Diagnosis not present

## 2019-06-29 DIAGNOSIS — N2581 Secondary hyperparathyroidism of renal origin: Secondary | ICD-10-CM | POA: Diagnosis not present

## 2019-06-29 DIAGNOSIS — E039 Hypothyroidism, unspecified: Secondary | ICD-10-CM | POA: Diagnosis not present

## 2019-07-01 DIAGNOSIS — Z992 Dependence on renal dialysis: Secondary | ICD-10-CM | POA: Diagnosis not present

## 2019-07-01 DIAGNOSIS — D631 Anemia in chronic kidney disease: Secondary | ICD-10-CM | POA: Diagnosis not present

## 2019-07-01 DIAGNOSIS — D689 Coagulation defect, unspecified: Secondary | ICD-10-CM | POA: Diagnosis not present

## 2019-07-01 DIAGNOSIS — D509 Iron deficiency anemia, unspecified: Secondary | ICD-10-CM | POA: Diagnosis not present

## 2019-07-01 DIAGNOSIS — Z23 Encounter for immunization: Secondary | ICD-10-CM | POA: Diagnosis not present

## 2019-07-01 DIAGNOSIS — N186 End stage renal disease: Secondary | ICD-10-CM | POA: Diagnosis not present

## 2019-07-01 DIAGNOSIS — E1129 Type 2 diabetes mellitus with other diabetic kidney complication: Secondary | ICD-10-CM | POA: Diagnosis not present

## 2019-07-01 DIAGNOSIS — E039 Hypothyroidism, unspecified: Secondary | ICD-10-CM | POA: Diagnosis not present

## 2019-07-01 DIAGNOSIS — N2581 Secondary hyperparathyroidism of renal origin: Secondary | ICD-10-CM | POA: Diagnosis not present

## 2019-07-03 DIAGNOSIS — D689 Coagulation defect, unspecified: Secondary | ICD-10-CM | POA: Diagnosis not present

## 2019-07-03 DIAGNOSIS — N186 End stage renal disease: Secondary | ICD-10-CM | POA: Diagnosis not present

## 2019-07-03 DIAGNOSIS — E039 Hypothyroidism, unspecified: Secondary | ICD-10-CM | POA: Diagnosis not present

## 2019-07-03 DIAGNOSIS — Z992 Dependence on renal dialysis: Secondary | ICD-10-CM | POA: Diagnosis not present

## 2019-07-03 DIAGNOSIS — N2581 Secondary hyperparathyroidism of renal origin: Secondary | ICD-10-CM | POA: Diagnosis not present

## 2019-07-03 DIAGNOSIS — D509 Iron deficiency anemia, unspecified: Secondary | ICD-10-CM | POA: Diagnosis not present

## 2019-07-03 DIAGNOSIS — E1129 Type 2 diabetes mellitus with other diabetic kidney complication: Secondary | ICD-10-CM | POA: Diagnosis not present

## 2019-07-03 DIAGNOSIS — D631 Anemia in chronic kidney disease: Secondary | ICD-10-CM | POA: Diagnosis not present

## 2019-07-03 DIAGNOSIS — Z23 Encounter for immunization: Secondary | ICD-10-CM | POA: Diagnosis not present

## 2019-07-06 DIAGNOSIS — D689 Coagulation defect, unspecified: Secondary | ICD-10-CM | POA: Diagnosis not present

## 2019-07-06 DIAGNOSIS — D631 Anemia in chronic kidney disease: Secondary | ICD-10-CM | POA: Diagnosis not present

## 2019-07-06 DIAGNOSIS — E1129 Type 2 diabetes mellitus with other diabetic kidney complication: Secondary | ICD-10-CM | POA: Diagnosis not present

## 2019-07-06 DIAGNOSIS — E039 Hypothyroidism, unspecified: Secondary | ICD-10-CM | POA: Diagnosis not present

## 2019-07-06 DIAGNOSIS — N2581 Secondary hyperparathyroidism of renal origin: Secondary | ICD-10-CM | POA: Diagnosis not present

## 2019-07-06 DIAGNOSIS — D509 Iron deficiency anemia, unspecified: Secondary | ICD-10-CM | POA: Diagnosis not present

## 2019-07-06 DIAGNOSIS — N186 End stage renal disease: Secondary | ICD-10-CM | POA: Diagnosis not present

## 2019-07-06 DIAGNOSIS — Z992 Dependence on renal dialysis: Secondary | ICD-10-CM | POA: Diagnosis not present

## 2019-07-06 DIAGNOSIS — Z23 Encounter for immunization: Secondary | ICD-10-CM | POA: Diagnosis not present

## 2019-07-08 DIAGNOSIS — E039 Hypothyroidism, unspecified: Secondary | ICD-10-CM | POA: Diagnosis not present

## 2019-07-08 DIAGNOSIS — Z23 Encounter for immunization: Secondary | ICD-10-CM | POA: Diagnosis not present

## 2019-07-08 DIAGNOSIS — D509 Iron deficiency anemia, unspecified: Secondary | ICD-10-CM | POA: Diagnosis not present

## 2019-07-08 DIAGNOSIS — N186 End stage renal disease: Secondary | ICD-10-CM | POA: Diagnosis not present

## 2019-07-08 DIAGNOSIS — Z992 Dependence on renal dialysis: Secondary | ICD-10-CM | POA: Diagnosis not present

## 2019-07-08 DIAGNOSIS — D689 Coagulation defect, unspecified: Secondary | ICD-10-CM | POA: Diagnosis not present

## 2019-07-08 DIAGNOSIS — E1129 Type 2 diabetes mellitus with other diabetic kidney complication: Secondary | ICD-10-CM | POA: Diagnosis not present

## 2019-07-08 DIAGNOSIS — D631 Anemia in chronic kidney disease: Secondary | ICD-10-CM | POA: Diagnosis not present

## 2019-07-08 DIAGNOSIS — N2581 Secondary hyperparathyroidism of renal origin: Secondary | ICD-10-CM | POA: Diagnosis not present

## 2019-07-10 DIAGNOSIS — D509 Iron deficiency anemia, unspecified: Secondary | ICD-10-CM | POA: Diagnosis not present

## 2019-07-10 DIAGNOSIS — D689 Coagulation defect, unspecified: Secondary | ICD-10-CM | POA: Diagnosis not present

## 2019-07-10 DIAGNOSIS — E039 Hypothyroidism, unspecified: Secondary | ICD-10-CM | POA: Diagnosis not present

## 2019-07-10 DIAGNOSIS — D631 Anemia in chronic kidney disease: Secondary | ICD-10-CM | POA: Diagnosis not present

## 2019-07-10 DIAGNOSIS — E1129 Type 2 diabetes mellitus with other diabetic kidney complication: Secondary | ICD-10-CM | POA: Diagnosis not present

## 2019-07-10 DIAGNOSIS — Z992 Dependence on renal dialysis: Secondary | ICD-10-CM | POA: Diagnosis not present

## 2019-07-10 DIAGNOSIS — Z23 Encounter for immunization: Secondary | ICD-10-CM | POA: Diagnosis not present

## 2019-07-10 DIAGNOSIS — N186 End stage renal disease: Secondary | ICD-10-CM | POA: Diagnosis not present

## 2019-07-10 DIAGNOSIS — N2581 Secondary hyperparathyroidism of renal origin: Secondary | ICD-10-CM | POA: Diagnosis not present

## 2019-07-13 DIAGNOSIS — E1129 Type 2 diabetes mellitus with other diabetic kidney complication: Secondary | ICD-10-CM | POA: Diagnosis not present

## 2019-07-13 DIAGNOSIS — E039 Hypothyroidism, unspecified: Secondary | ICD-10-CM | POA: Diagnosis not present

## 2019-07-13 DIAGNOSIS — Z23 Encounter for immunization: Secondary | ICD-10-CM | POA: Diagnosis not present

## 2019-07-13 DIAGNOSIS — Z992 Dependence on renal dialysis: Secondary | ICD-10-CM | POA: Diagnosis not present

## 2019-07-13 DIAGNOSIS — D689 Coagulation defect, unspecified: Secondary | ICD-10-CM | POA: Diagnosis not present

## 2019-07-13 DIAGNOSIS — N186 End stage renal disease: Secondary | ICD-10-CM | POA: Diagnosis not present

## 2019-07-13 DIAGNOSIS — N2581 Secondary hyperparathyroidism of renal origin: Secondary | ICD-10-CM | POA: Diagnosis not present

## 2019-07-13 DIAGNOSIS — D631 Anemia in chronic kidney disease: Secondary | ICD-10-CM | POA: Diagnosis not present

## 2019-07-13 DIAGNOSIS — D509 Iron deficiency anemia, unspecified: Secondary | ICD-10-CM | POA: Diagnosis not present

## 2019-07-15 DIAGNOSIS — N186 End stage renal disease: Secondary | ICD-10-CM | POA: Diagnosis not present

## 2019-07-15 DIAGNOSIS — D509 Iron deficiency anemia, unspecified: Secondary | ICD-10-CM | POA: Diagnosis not present

## 2019-07-15 DIAGNOSIS — D631 Anemia in chronic kidney disease: Secondary | ICD-10-CM | POA: Diagnosis not present

## 2019-07-15 DIAGNOSIS — Z992 Dependence on renal dialysis: Secondary | ICD-10-CM | POA: Diagnosis not present

## 2019-07-15 DIAGNOSIS — Z23 Encounter for immunization: Secondary | ICD-10-CM | POA: Diagnosis not present

## 2019-07-15 DIAGNOSIS — N2581 Secondary hyperparathyroidism of renal origin: Secondary | ICD-10-CM | POA: Diagnosis not present

## 2019-07-15 DIAGNOSIS — E1129 Type 2 diabetes mellitus with other diabetic kidney complication: Secondary | ICD-10-CM | POA: Diagnosis not present

## 2019-07-15 DIAGNOSIS — E039 Hypothyroidism, unspecified: Secondary | ICD-10-CM | POA: Diagnosis not present

## 2019-07-15 DIAGNOSIS — D689 Coagulation defect, unspecified: Secondary | ICD-10-CM | POA: Diagnosis not present

## 2019-07-17 DIAGNOSIS — N2581 Secondary hyperparathyroidism of renal origin: Secondary | ICD-10-CM | POA: Diagnosis not present

## 2019-07-17 DIAGNOSIS — Z992 Dependence on renal dialysis: Secondary | ICD-10-CM | POA: Diagnosis not present

## 2019-07-17 DIAGNOSIS — D509 Iron deficiency anemia, unspecified: Secondary | ICD-10-CM | POA: Diagnosis not present

## 2019-07-17 DIAGNOSIS — Z23 Encounter for immunization: Secondary | ICD-10-CM | POA: Diagnosis not present

## 2019-07-17 DIAGNOSIS — E039 Hypothyroidism, unspecified: Secondary | ICD-10-CM | POA: Diagnosis not present

## 2019-07-17 DIAGNOSIS — D631 Anemia in chronic kidney disease: Secondary | ICD-10-CM | POA: Diagnosis not present

## 2019-07-17 DIAGNOSIS — D689 Coagulation defect, unspecified: Secondary | ICD-10-CM | POA: Diagnosis not present

## 2019-07-17 DIAGNOSIS — N186 End stage renal disease: Secondary | ICD-10-CM | POA: Diagnosis not present

## 2019-07-17 DIAGNOSIS — E1129 Type 2 diabetes mellitus with other diabetic kidney complication: Secondary | ICD-10-CM | POA: Diagnosis not present

## 2019-07-19 DIAGNOSIS — E039 Hypothyroidism, unspecified: Secondary | ICD-10-CM | POA: Diagnosis not present

## 2019-07-19 DIAGNOSIS — N2581 Secondary hyperparathyroidism of renal origin: Secondary | ICD-10-CM | POA: Diagnosis not present

## 2019-07-19 DIAGNOSIS — Z992 Dependence on renal dialysis: Secondary | ICD-10-CM | POA: Diagnosis not present

## 2019-07-19 DIAGNOSIS — D631 Anemia in chronic kidney disease: Secondary | ICD-10-CM | POA: Diagnosis not present

## 2019-07-19 DIAGNOSIS — Z23 Encounter for immunization: Secondary | ICD-10-CM | POA: Diagnosis not present

## 2019-07-19 DIAGNOSIS — D689 Coagulation defect, unspecified: Secondary | ICD-10-CM | POA: Diagnosis not present

## 2019-07-19 DIAGNOSIS — E1129 Type 2 diabetes mellitus with other diabetic kidney complication: Secondary | ICD-10-CM | POA: Diagnosis not present

## 2019-07-19 DIAGNOSIS — N186 End stage renal disease: Secondary | ICD-10-CM | POA: Diagnosis not present

## 2019-07-19 DIAGNOSIS — D509 Iron deficiency anemia, unspecified: Secondary | ICD-10-CM | POA: Diagnosis not present

## 2019-07-20 DIAGNOSIS — N2581 Secondary hyperparathyroidism of renal origin: Secondary | ICD-10-CM | POA: Diagnosis not present

## 2019-07-20 DIAGNOSIS — D631 Anemia in chronic kidney disease: Secondary | ICD-10-CM | POA: Diagnosis not present

## 2019-07-20 DIAGNOSIS — Z992 Dependence on renal dialysis: Secondary | ICD-10-CM | POA: Diagnosis not present

## 2019-07-20 DIAGNOSIS — E1129 Type 2 diabetes mellitus with other diabetic kidney complication: Secondary | ICD-10-CM | POA: Diagnosis not present

## 2019-07-20 DIAGNOSIS — D689 Coagulation defect, unspecified: Secondary | ICD-10-CM | POA: Diagnosis not present

## 2019-07-20 DIAGNOSIS — D509 Iron deficiency anemia, unspecified: Secondary | ICD-10-CM | POA: Diagnosis not present

## 2019-07-20 DIAGNOSIS — N186 End stage renal disease: Secondary | ICD-10-CM | POA: Diagnosis not present

## 2019-07-20 DIAGNOSIS — E039 Hypothyroidism, unspecified: Secondary | ICD-10-CM | POA: Diagnosis not present

## 2019-07-20 DIAGNOSIS — Z23 Encounter for immunization: Secondary | ICD-10-CM | POA: Diagnosis not present

## 2019-07-22 DIAGNOSIS — Z992 Dependence on renal dialysis: Secondary | ICD-10-CM | POA: Diagnosis not present

## 2019-07-22 DIAGNOSIS — E1129 Type 2 diabetes mellitus with other diabetic kidney complication: Secondary | ICD-10-CM | POA: Diagnosis not present

## 2019-07-22 DIAGNOSIS — N2581 Secondary hyperparathyroidism of renal origin: Secondary | ICD-10-CM | POA: Diagnosis not present

## 2019-07-22 DIAGNOSIS — D631 Anemia in chronic kidney disease: Secondary | ICD-10-CM | POA: Diagnosis not present

## 2019-07-22 DIAGNOSIS — N186 End stage renal disease: Secondary | ICD-10-CM | POA: Diagnosis not present

## 2019-07-22 DIAGNOSIS — D689 Coagulation defect, unspecified: Secondary | ICD-10-CM | POA: Diagnosis not present

## 2019-07-22 DIAGNOSIS — D509 Iron deficiency anemia, unspecified: Secondary | ICD-10-CM | POA: Diagnosis not present

## 2019-07-22 DIAGNOSIS — E039 Hypothyroidism, unspecified: Secondary | ICD-10-CM | POA: Diagnosis not present

## 2019-07-22 DIAGNOSIS — Z23 Encounter for immunization: Secondary | ICD-10-CM | POA: Diagnosis not present

## 2019-07-23 DIAGNOSIS — J45909 Unspecified asthma, uncomplicated: Secondary | ICD-10-CM | POA: Diagnosis not present

## 2019-07-23 DIAGNOSIS — R05 Cough: Secondary | ICD-10-CM | POA: Diagnosis not present

## 2019-07-23 DIAGNOSIS — D3A090 Benign carcinoid tumor of the bronchus and lung: Secondary | ICD-10-CM | POA: Diagnosis not present

## 2019-07-23 DIAGNOSIS — J4550 Severe persistent asthma, uncomplicated: Secondary | ICD-10-CM | POA: Diagnosis not present

## 2019-07-24 DIAGNOSIS — E1129 Type 2 diabetes mellitus with other diabetic kidney complication: Secondary | ICD-10-CM | POA: Diagnosis not present

## 2019-07-24 DIAGNOSIS — T8612 Kidney transplant failure: Secondary | ICD-10-CM | POA: Diagnosis not present

## 2019-07-24 DIAGNOSIS — D631 Anemia in chronic kidney disease: Secondary | ICD-10-CM | POA: Diagnosis not present

## 2019-07-24 DIAGNOSIS — E039 Hypothyroidism, unspecified: Secondary | ICD-10-CM | POA: Diagnosis not present

## 2019-07-24 DIAGNOSIS — N186 End stage renal disease: Secondary | ICD-10-CM | POA: Diagnosis not present

## 2019-07-24 DIAGNOSIS — Z23 Encounter for immunization: Secondary | ICD-10-CM | POA: Diagnosis not present

## 2019-07-24 DIAGNOSIS — D509 Iron deficiency anemia, unspecified: Secondary | ICD-10-CM | POA: Diagnosis not present

## 2019-07-24 DIAGNOSIS — Z992 Dependence on renal dialysis: Secondary | ICD-10-CM | POA: Diagnosis not present

## 2019-07-24 DIAGNOSIS — D689 Coagulation defect, unspecified: Secondary | ICD-10-CM | POA: Diagnosis not present

## 2019-07-24 DIAGNOSIS — N2581 Secondary hyperparathyroidism of renal origin: Secondary | ICD-10-CM | POA: Diagnosis not present

## 2019-07-27 DIAGNOSIS — Z992 Dependence on renal dialysis: Secondary | ICD-10-CM | POA: Diagnosis not present

## 2019-07-27 DIAGNOSIS — E039 Hypothyroidism, unspecified: Secondary | ICD-10-CM | POA: Diagnosis not present

## 2019-07-27 DIAGNOSIS — N186 End stage renal disease: Secondary | ICD-10-CM | POA: Diagnosis not present

## 2019-07-27 DIAGNOSIS — J45909 Unspecified asthma, uncomplicated: Secondary | ICD-10-CM | POA: Diagnosis not present

## 2019-07-27 DIAGNOSIS — D509 Iron deficiency anemia, unspecified: Secondary | ICD-10-CM | POA: Diagnosis not present

## 2019-07-27 DIAGNOSIS — N2581 Secondary hyperparathyroidism of renal origin: Secondary | ICD-10-CM | POA: Diagnosis not present

## 2019-07-27 DIAGNOSIS — D689 Coagulation defect, unspecified: Secondary | ICD-10-CM | POA: Diagnosis not present

## 2019-07-29 DIAGNOSIS — N2581 Secondary hyperparathyroidism of renal origin: Secondary | ICD-10-CM | POA: Diagnosis not present

## 2019-07-29 DIAGNOSIS — D689 Coagulation defect, unspecified: Secondary | ICD-10-CM | POA: Diagnosis not present

## 2019-07-29 DIAGNOSIS — D509 Iron deficiency anemia, unspecified: Secondary | ICD-10-CM | POA: Diagnosis not present

## 2019-07-29 DIAGNOSIS — N186 End stage renal disease: Secondary | ICD-10-CM | POA: Diagnosis not present

## 2019-07-29 DIAGNOSIS — E039 Hypothyroidism, unspecified: Secondary | ICD-10-CM | POA: Diagnosis not present

## 2019-07-29 DIAGNOSIS — J45909 Unspecified asthma, uncomplicated: Secondary | ICD-10-CM | POA: Diagnosis not present

## 2019-07-29 DIAGNOSIS — Z992 Dependence on renal dialysis: Secondary | ICD-10-CM | POA: Diagnosis not present

## 2019-07-31 DIAGNOSIS — J45909 Unspecified asthma, uncomplicated: Secondary | ICD-10-CM | POA: Diagnosis not present

## 2019-07-31 DIAGNOSIS — N186 End stage renal disease: Secondary | ICD-10-CM | POA: Diagnosis not present

## 2019-07-31 DIAGNOSIS — N2581 Secondary hyperparathyroidism of renal origin: Secondary | ICD-10-CM | POA: Diagnosis not present

## 2019-07-31 DIAGNOSIS — Z992 Dependence on renal dialysis: Secondary | ICD-10-CM | POA: Diagnosis not present

## 2019-07-31 DIAGNOSIS — E039 Hypothyroidism, unspecified: Secondary | ICD-10-CM | POA: Diagnosis not present

## 2019-07-31 DIAGNOSIS — D689 Coagulation defect, unspecified: Secondary | ICD-10-CM | POA: Diagnosis not present

## 2019-07-31 DIAGNOSIS — D509 Iron deficiency anemia, unspecified: Secondary | ICD-10-CM | POA: Diagnosis not present

## 2019-08-03 DIAGNOSIS — D509 Iron deficiency anemia, unspecified: Secondary | ICD-10-CM | POA: Diagnosis not present

## 2019-08-03 DIAGNOSIS — N186 End stage renal disease: Secondary | ICD-10-CM | POA: Diagnosis not present

## 2019-08-03 DIAGNOSIS — D689 Coagulation defect, unspecified: Secondary | ICD-10-CM | POA: Diagnosis not present

## 2019-08-03 DIAGNOSIS — E039 Hypothyroidism, unspecified: Secondary | ICD-10-CM | POA: Diagnosis not present

## 2019-08-03 DIAGNOSIS — J45909 Unspecified asthma, uncomplicated: Secondary | ICD-10-CM | POA: Diagnosis not present

## 2019-08-03 DIAGNOSIS — Z992 Dependence on renal dialysis: Secondary | ICD-10-CM | POA: Diagnosis not present

## 2019-08-03 DIAGNOSIS — N2581 Secondary hyperparathyroidism of renal origin: Secondary | ICD-10-CM | POA: Diagnosis not present

## 2019-08-04 DIAGNOSIS — R918 Other nonspecific abnormal finding of lung field: Secondary | ICD-10-CM | POA: Diagnosis not present

## 2019-08-04 DIAGNOSIS — N186 End stage renal disease: Secondary | ICD-10-CM | POA: Diagnosis not present

## 2019-08-04 DIAGNOSIS — A319 Mycobacterial infection, unspecified: Secondary | ICD-10-CM | POA: Diagnosis not present

## 2019-08-04 DIAGNOSIS — M0579 Rheumatoid arthritis with rheumatoid factor of multiple sites without organ or systems involvement: Secondary | ICD-10-CM | POA: Diagnosis not present

## 2019-08-05 DIAGNOSIS — E039 Hypothyroidism, unspecified: Secondary | ICD-10-CM | POA: Diagnosis not present

## 2019-08-05 DIAGNOSIS — D509 Iron deficiency anemia, unspecified: Secondary | ICD-10-CM | POA: Diagnosis not present

## 2019-08-05 DIAGNOSIS — D689 Coagulation defect, unspecified: Secondary | ICD-10-CM | POA: Diagnosis not present

## 2019-08-05 DIAGNOSIS — N186 End stage renal disease: Secondary | ICD-10-CM | POA: Diagnosis not present

## 2019-08-05 DIAGNOSIS — Z992 Dependence on renal dialysis: Secondary | ICD-10-CM | POA: Diagnosis not present

## 2019-08-05 DIAGNOSIS — J45909 Unspecified asthma, uncomplicated: Secondary | ICD-10-CM | POA: Diagnosis not present

## 2019-08-05 DIAGNOSIS — N2581 Secondary hyperparathyroidism of renal origin: Secondary | ICD-10-CM | POA: Diagnosis not present

## 2019-08-07 DIAGNOSIS — N186 End stage renal disease: Secondary | ICD-10-CM | POA: Diagnosis not present

## 2019-08-07 DIAGNOSIS — Z992 Dependence on renal dialysis: Secondary | ICD-10-CM | POA: Diagnosis not present

## 2019-08-07 DIAGNOSIS — E039 Hypothyroidism, unspecified: Secondary | ICD-10-CM | POA: Diagnosis not present

## 2019-08-07 DIAGNOSIS — D509 Iron deficiency anemia, unspecified: Secondary | ICD-10-CM | POA: Diagnosis not present

## 2019-08-07 DIAGNOSIS — D689 Coagulation defect, unspecified: Secondary | ICD-10-CM | POA: Diagnosis not present

## 2019-08-07 DIAGNOSIS — N2581 Secondary hyperparathyroidism of renal origin: Secondary | ICD-10-CM | POA: Diagnosis not present

## 2019-08-07 DIAGNOSIS — J45909 Unspecified asthma, uncomplicated: Secondary | ICD-10-CM | POA: Diagnosis not present

## 2019-08-10 DIAGNOSIS — Z992 Dependence on renal dialysis: Secondary | ICD-10-CM | POA: Diagnosis not present

## 2019-08-10 DIAGNOSIS — D689 Coagulation defect, unspecified: Secondary | ICD-10-CM | POA: Diagnosis not present

## 2019-08-10 DIAGNOSIS — N2581 Secondary hyperparathyroidism of renal origin: Secondary | ICD-10-CM | POA: Diagnosis not present

## 2019-08-10 DIAGNOSIS — J45909 Unspecified asthma, uncomplicated: Secondary | ICD-10-CM | POA: Diagnosis not present

## 2019-08-10 DIAGNOSIS — N186 End stage renal disease: Secondary | ICD-10-CM | POA: Diagnosis not present

## 2019-08-10 DIAGNOSIS — E039 Hypothyroidism, unspecified: Secondary | ICD-10-CM | POA: Diagnosis not present

## 2019-08-10 DIAGNOSIS — D509 Iron deficiency anemia, unspecified: Secondary | ICD-10-CM | POA: Diagnosis not present

## 2019-08-12 DIAGNOSIS — N2581 Secondary hyperparathyroidism of renal origin: Secondary | ICD-10-CM | POA: Diagnosis not present

## 2019-08-12 DIAGNOSIS — N186 End stage renal disease: Secondary | ICD-10-CM | POA: Diagnosis not present

## 2019-08-12 DIAGNOSIS — D689 Coagulation defect, unspecified: Secondary | ICD-10-CM | POA: Diagnosis not present

## 2019-08-12 DIAGNOSIS — E039 Hypothyroidism, unspecified: Secondary | ICD-10-CM | POA: Diagnosis not present

## 2019-08-12 DIAGNOSIS — J45909 Unspecified asthma, uncomplicated: Secondary | ICD-10-CM | POA: Diagnosis not present

## 2019-08-12 DIAGNOSIS — D509 Iron deficiency anemia, unspecified: Secondary | ICD-10-CM | POA: Diagnosis not present

## 2019-08-12 DIAGNOSIS — Z992 Dependence on renal dialysis: Secondary | ICD-10-CM | POA: Diagnosis not present

## 2019-08-14 DIAGNOSIS — D509 Iron deficiency anemia, unspecified: Secondary | ICD-10-CM | POA: Diagnosis not present

## 2019-08-14 DIAGNOSIS — Z992 Dependence on renal dialysis: Secondary | ICD-10-CM | POA: Diagnosis not present

## 2019-08-14 DIAGNOSIS — J45909 Unspecified asthma, uncomplicated: Secondary | ICD-10-CM | POA: Diagnosis not present

## 2019-08-14 DIAGNOSIS — N2581 Secondary hyperparathyroidism of renal origin: Secondary | ICD-10-CM | POA: Diagnosis not present

## 2019-08-14 DIAGNOSIS — E039 Hypothyroidism, unspecified: Secondary | ICD-10-CM | POA: Diagnosis not present

## 2019-08-14 DIAGNOSIS — N186 End stage renal disease: Secondary | ICD-10-CM | POA: Diagnosis not present

## 2019-08-14 DIAGNOSIS — D689 Coagulation defect, unspecified: Secondary | ICD-10-CM | POA: Diagnosis not present

## 2019-08-16 DIAGNOSIS — N186 End stage renal disease: Secondary | ICD-10-CM | POA: Diagnosis not present

## 2019-08-16 DIAGNOSIS — D689 Coagulation defect, unspecified: Secondary | ICD-10-CM | POA: Diagnosis not present

## 2019-08-16 DIAGNOSIS — N2581 Secondary hyperparathyroidism of renal origin: Secondary | ICD-10-CM | POA: Diagnosis not present

## 2019-08-16 DIAGNOSIS — E039 Hypothyroidism, unspecified: Secondary | ICD-10-CM | POA: Diagnosis not present

## 2019-08-16 DIAGNOSIS — D509 Iron deficiency anemia, unspecified: Secondary | ICD-10-CM | POA: Diagnosis not present

## 2019-08-16 DIAGNOSIS — J45909 Unspecified asthma, uncomplicated: Secondary | ICD-10-CM | POA: Diagnosis not present

## 2019-08-16 DIAGNOSIS — Z992 Dependence on renal dialysis: Secondary | ICD-10-CM | POA: Diagnosis not present

## 2019-08-18 DIAGNOSIS — Z992 Dependence on renal dialysis: Secondary | ICD-10-CM | POA: Diagnosis not present

## 2019-08-18 DIAGNOSIS — J45909 Unspecified asthma, uncomplicated: Secondary | ICD-10-CM | POA: Diagnosis not present

## 2019-08-18 DIAGNOSIS — N186 End stage renal disease: Secondary | ICD-10-CM | POA: Diagnosis not present

## 2019-08-18 DIAGNOSIS — E039 Hypothyroidism, unspecified: Secondary | ICD-10-CM | POA: Diagnosis not present

## 2019-08-18 DIAGNOSIS — D509 Iron deficiency anemia, unspecified: Secondary | ICD-10-CM | POA: Diagnosis not present

## 2019-08-18 DIAGNOSIS — D689 Coagulation defect, unspecified: Secondary | ICD-10-CM | POA: Diagnosis not present

## 2019-08-18 DIAGNOSIS — N2581 Secondary hyperparathyroidism of renal origin: Secondary | ICD-10-CM | POA: Diagnosis not present

## 2019-08-21 DIAGNOSIS — D689 Coagulation defect, unspecified: Secondary | ICD-10-CM | POA: Diagnosis not present

## 2019-08-21 DIAGNOSIS — J45909 Unspecified asthma, uncomplicated: Secondary | ICD-10-CM | POA: Diagnosis not present

## 2019-08-21 DIAGNOSIS — D509 Iron deficiency anemia, unspecified: Secondary | ICD-10-CM | POA: Diagnosis not present

## 2019-08-21 DIAGNOSIS — Z992 Dependence on renal dialysis: Secondary | ICD-10-CM | POA: Diagnosis not present

## 2019-08-21 DIAGNOSIS — E039 Hypothyroidism, unspecified: Secondary | ICD-10-CM | POA: Diagnosis not present

## 2019-08-21 DIAGNOSIS — N186 End stage renal disease: Secondary | ICD-10-CM | POA: Diagnosis not present

## 2019-08-21 DIAGNOSIS — N2581 Secondary hyperparathyroidism of renal origin: Secondary | ICD-10-CM | POA: Diagnosis not present

## 2019-08-23 DIAGNOSIS — T8612 Kidney transplant failure: Secondary | ICD-10-CM | POA: Diagnosis not present

## 2019-08-23 DIAGNOSIS — Z992 Dependence on renal dialysis: Secondary | ICD-10-CM | POA: Diagnosis not present

## 2019-08-23 DIAGNOSIS — N186 End stage renal disease: Secondary | ICD-10-CM | POA: Diagnosis not present

## 2019-08-24 DIAGNOSIS — N2581 Secondary hyperparathyroidism of renal origin: Secondary | ICD-10-CM | POA: Diagnosis not present

## 2019-08-24 DIAGNOSIS — D631 Anemia in chronic kidney disease: Secondary | ICD-10-CM | POA: Diagnosis not present

## 2019-08-24 DIAGNOSIS — D689 Coagulation defect, unspecified: Secondary | ICD-10-CM | POA: Diagnosis not present

## 2019-08-24 DIAGNOSIS — N186 End stage renal disease: Secondary | ICD-10-CM | POA: Diagnosis not present

## 2019-08-24 DIAGNOSIS — K739 Chronic hepatitis, unspecified: Secondary | ICD-10-CM | POA: Diagnosis not present

## 2019-08-24 DIAGNOSIS — Z992 Dependence on renal dialysis: Secondary | ICD-10-CM | POA: Diagnosis not present

## 2019-08-24 DIAGNOSIS — E039 Hypothyroidism, unspecified: Secondary | ICD-10-CM | POA: Diagnosis not present

## 2019-08-26 DIAGNOSIS — K739 Chronic hepatitis, unspecified: Secondary | ICD-10-CM | POA: Diagnosis not present

## 2019-08-26 DIAGNOSIS — N2581 Secondary hyperparathyroidism of renal origin: Secondary | ICD-10-CM | POA: Diagnosis not present

## 2019-08-26 DIAGNOSIS — E039 Hypothyroidism, unspecified: Secondary | ICD-10-CM | POA: Diagnosis not present

## 2019-08-26 DIAGNOSIS — Z992 Dependence on renal dialysis: Secondary | ICD-10-CM | POA: Diagnosis not present

## 2019-08-26 DIAGNOSIS — D631 Anemia in chronic kidney disease: Secondary | ICD-10-CM | POA: Diagnosis not present

## 2019-08-26 DIAGNOSIS — N186 End stage renal disease: Secondary | ICD-10-CM | POA: Diagnosis not present

## 2019-08-26 DIAGNOSIS — D689 Coagulation defect, unspecified: Secondary | ICD-10-CM | POA: Diagnosis not present

## 2019-08-28 DIAGNOSIS — N186 End stage renal disease: Secondary | ICD-10-CM | POA: Diagnosis not present

## 2019-08-28 DIAGNOSIS — D689 Coagulation defect, unspecified: Secondary | ICD-10-CM | POA: Diagnosis not present

## 2019-08-28 DIAGNOSIS — K739 Chronic hepatitis, unspecified: Secondary | ICD-10-CM | POA: Diagnosis not present

## 2019-08-28 DIAGNOSIS — D631 Anemia in chronic kidney disease: Secondary | ICD-10-CM | POA: Diagnosis not present

## 2019-08-28 DIAGNOSIS — E039 Hypothyroidism, unspecified: Secondary | ICD-10-CM | POA: Diagnosis not present

## 2019-08-28 DIAGNOSIS — N2581 Secondary hyperparathyroidism of renal origin: Secondary | ICD-10-CM | POA: Diagnosis not present

## 2019-08-28 DIAGNOSIS — Z992 Dependence on renal dialysis: Secondary | ICD-10-CM | POA: Diagnosis not present

## 2019-08-31 DIAGNOSIS — N2581 Secondary hyperparathyroidism of renal origin: Secondary | ICD-10-CM | POA: Diagnosis not present

## 2019-08-31 DIAGNOSIS — D631 Anemia in chronic kidney disease: Secondary | ICD-10-CM | POA: Diagnosis not present

## 2019-08-31 DIAGNOSIS — E039 Hypothyroidism, unspecified: Secondary | ICD-10-CM | POA: Diagnosis not present

## 2019-08-31 DIAGNOSIS — K739 Chronic hepatitis, unspecified: Secondary | ICD-10-CM | POA: Diagnosis not present

## 2019-08-31 DIAGNOSIS — N186 End stage renal disease: Secondary | ICD-10-CM | POA: Diagnosis not present

## 2019-08-31 DIAGNOSIS — D689 Coagulation defect, unspecified: Secondary | ICD-10-CM | POA: Diagnosis not present

## 2019-08-31 DIAGNOSIS — Z992 Dependence on renal dialysis: Secondary | ICD-10-CM | POA: Diagnosis not present

## 2019-09-02 DIAGNOSIS — K739 Chronic hepatitis, unspecified: Secondary | ICD-10-CM | POA: Diagnosis not present

## 2019-09-02 DIAGNOSIS — E039 Hypothyroidism, unspecified: Secondary | ICD-10-CM | POA: Diagnosis not present

## 2019-09-02 DIAGNOSIS — D631 Anemia in chronic kidney disease: Secondary | ICD-10-CM | POA: Diagnosis not present

## 2019-09-02 DIAGNOSIS — N186 End stage renal disease: Secondary | ICD-10-CM | POA: Diagnosis not present

## 2019-09-02 DIAGNOSIS — D689 Coagulation defect, unspecified: Secondary | ICD-10-CM | POA: Diagnosis not present

## 2019-09-02 DIAGNOSIS — N2581 Secondary hyperparathyroidism of renal origin: Secondary | ICD-10-CM | POA: Diagnosis not present

## 2019-09-02 DIAGNOSIS — Z992 Dependence on renal dialysis: Secondary | ICD-10-CM | POA: Diagnosis not present

## 2019-09-04 DIAGNOSIS — E039 Hypothyroidism, unspecified: Secondary | ICD-10-CM | POA: Diagnosis not present

## 2019-09-04 DIAGNOSIS — N186 End stage renal disease: Secondary | ICD-10-CM | POA: Diagnosis not present

## 2019-09-04 DIAGNOSIS — Z992 Dependence on renal dialysis: Secondary | ICD-10-CM | POA: Diagnosis not present

## 2019-09-04 DIAGNOSIS — N2581 Secondary hyperparathyroidism of renal origin: Secondary | ICD-10-CM | POA: Diagnosis not present

## 2019-09-04 DIAGNOSIS — D689 Coagulation defect, unspecified: Secondary | ICD-10-CM | POA: Diagnosis not present

## 2019-09-04 DIAGNOSIS — D631 Anemia in chronic kidney disease: Secondary | ICD-10-CM | POA: Diagnosis not present

## 2019-09-04 DIAGNOSIS — K739 Chronic hepatitis, unspecified: Secondary | ICD-10-CM | POA: Diagnosis not present

## 2019-09-07 DIAGNOSIS — K739 Chronic hepatitis, unspecified: Secondary | ICD-10-CM | POA: Diagnosis not present

## 2019-09-07 DIAGNOSIS — N186 End stage renal disease: Secondary | ICD-10-CM | POA: Diagnosis not present

## 2019-09-07 DIAGNOSIS — E039 Hypothyroidism, unspecified: Secondary | ICD-10-CM | POA: Diagnosis not present

## 2019-09-07 DIAGNOSIS — N2581 Secondary hyperparathyroidism of renal origin: Secondary | ICD-10-CM | POA: Diagnosis not present

## 2019-09-07 DIAGNOSIS — Z992 Dependence on renal dialysis: Secondary | ICD-10-CM | POA: Diagnosis not present

## 2019-09-07 DIAGNOSIS — D689 Coagulation defect, unspecified: Secondary | ICD-10-CM | POA: Diagnosis not present

## 2019-09-07 DIAGNOSIS — D631 Anemia in chronic kidney disease: Secondary | ICD-10-CM | POA: Diagnosis not present

## 2019-09-09 DIAGNOSIS — N2581 Secondary hyperparathyroidism of renal origin: Secondary | ICD-10-CM | POA: Diagnosis not present

## 2019-09-09 DIAGNOSIS — K739 Chronic hepatitis, unspecified: Secondary | ICD-10-CM | POA: Diagnosis not present

## 2019-09-09 DIAGNOSIS — D689 Coagulation defect, unspecified: Secondary | ICD-10-CM | POA: Diagnosis not present

## 2019-09-09 DIAGNOSIS — D631 Anemia in chronic kidney disease: Secondary | ICD-10-CM | POA: Diagnosis not present

## 2019-09-09 DIAGNOSIS — Z992 Dependence on renal dialysis: Secondary | ICD-10-CM | POA: Diagnosis not present

## 2019-09-09 DIAGNOSIS — E039 Hypothyroidism, unspecified: Secondary | ICD-10-CM | POA: Diagnosis not present

## 2019-09-09 DIAGNOSIS — N186 End stage renal disease: Secondary | ICD-10-CM | POA: Diagnosis not present

## 2019-09-11 DIAGNOSIS — N186 End stage renal disease: Secondary | ICD-10-CM | POA: Diagnosis not present

## 2019-09-11 DIAGNOSIS — D631 Anemia in chronic kidney disease: Secondary | ICD-10-CM | POA: Diagnosis not present

## 2019-09-11 DIAGNOSIS — E039 Hypothyroidism, unspecified: Secondary | ICD-10-CM | POA: Diagnosis not present

## 2019-09-11 DIAGNOSIS — K739 Chronic hepatitis, unspecified: Secondary | ICD-10-CM | POA: Diagnosis not present

## 2019-09-11 DIAGNOSIS — N2581 Secondary hyperparathyroidism of renal origin: Secondary | ICD-10-CM | POA: Diagnosis not present

## 2019-09-11 DIAGNOSIS — D689 Coagulation defect, unspecified: Secondary | ICD-10-CM | POA: Diagnosis not present

## 2019-09-11 DIAGNOSIS — Z992 Dependence on renal dialysis: Secondary | ICD-10-CM | POA: Diagnosis not present

## 2019-09-13 DIAGNOSIS — N186 End stage renal disease: Secondary | ICD-10-CM | POA: Diagnosis not present

## 2019-09-13 DIAGNOSIS — I10 Essential (primary) hypertension: Secondary | ICD-10-CM | POA: Diagnosis not present

## 2019-09-13 DIAGNOSIS — Z0000 Encounter for general adult medical examination without abnormal findings: Secondary | ICD-10-CM | POA: Diagnosis not present

## 2019-09-13 DIAGNOSIS — Z1389 Encounter for screening for other disorder: Secondary | ICD-10-CM | POA: Diagnosis not present

## 2019-09-14 DIAGNOSIS — N2581 Secondary hyperparathyroidism of renal origin: Secondary | ICD-10-CM | POA: Diagnosis not present

## 2019-09-14 DIAGNOSIS — N186 End stage renal disease: Secondary | ICD-10-CM | POA: Diagnosis not present

## 2019-09-14 DIAGNOSIS — K739 Chronic hepatitis, unspecified: Secondary | ICD-10-CM | POA: Diagnosis not present

## 2019-09-14 DIAGNOSIS — D689 Coagulation defect, unspecified: Secondary | ICD-10-CM | POA: Diagnosis not present

## 2019-09-14 DIAGNOSIS — E039 Hypothyroidism, unspecified: Secondary | ICD-10-CM | POA: Diagnosis not present

## 2019-09-14 DIAGNOSIS — Z992 Dependence on renal dialysis: Secondary | ICD-10-CM | POA: Diagnosis not present

## 2019-09-14 DIAGNOSIS — D631 Anemia in chronic kidney disease: Secondary | ICD-10-CM | POA: Diagnosis not present

## 2019-09-16 DIAGNOSIS — K739 Chronic hepatitis, unspecified: Secondary | ICD-10-CM | POA: Diagnosis not present

## 2019-09-16 DIAGNOSIS — N186 End stage renal disease: Secondary | ICD-10-CM | POA: Diagnosis not present

## 2019-09-16 DIAGNOSIS — Z992 Dependence on renal dialysis: Secondary | ICD-10-CM | POA: Diagnosis not present

## 2019-09-16 DIAGNOSIS — N2581 Secondary hyperparathyroidism of renal origin: Secondary | ICD-10-CM | POA: Diagnosis not present

## 2019-09-16 DIAGNOSIS — D631 Anemia in chronic kidney disease: Secondary | ICD-10-CM | POA: Diagnosis not present

## 2019-09-16 DIAGNOSIS — E039 Hypothyroidism, unspecified: Secondary | ICD-10-CM | POA: Diagnosis not present

## 2019-09-16 DIAGNOSIS — D689 Coagulation defect, unspecified: Secondary | ICD-10-CM | POA: Diagnosis not present

## 2019-09-19 DIAGNOSIS — N2581 Secondary hyperparathyroidism of renal origin: Secondary | ICD-10-CM | POA: Diagnosis not present

## 2019-09-19 DIAGNOSIS — K739 Chronic hepatitis, unspecified: Secondary | ICD-10-CM | POA: Diagnosis not present

## 2019-09-19 DIAGNOSIS — D689 Coagulation defect, unspecified: Secondary | ICD-10-CM | POA: Diagnosis not present

## 2019-09-19 DIAGNOSIS — D631 Anemia in chronic kidney disease: Secondary | ICD-10-CM | POA: Diagnosis not present

## 2019-09-19 DIAGNOSIS — N186 End stage renal disease: Secondary | ICD-10-CM | POA: Diagnosis not present

## 2019-09-19 DIAGNOSIS — E039 Hypothyroidism, unspecified: Secondary | ICD-10-CM | POA: Diagnosis not present

## 2019-09-19 DIAGNOSIS — Z992 Dependence on renal dialysis: Secondary | ICD-10-CM | POA: Diagnosis not present

## 2019-09-20 DIAGNOSIS — D631 Anemia in chronic kidney disease: Secondary | ICD-10-CM | POA: Diagnosis not present

## 2019-09-20 DIAGNOSIS — D689 Coagulation defect, unspecified: Secondary | ICD-10-CM | POA: Diagnosis not present

## 2019-09-20 DIAGNOSIS — N2581 Secondary hyperparathyroidism of renal origin: Secondary | ICD-10-CM | POA: Diagnosis not present

## 2019-09-20 DIAGNOSIS — E039 Hypothyroidism, unspecified: Secondary | ICD-10-CM | POA: Diagnosis not present

## 2019-09-20 DIAGNOSIS — K739 Chronic hepatitis, unspecified: Secondary | ICD-10-CM | POA: Diagnosis not present

## 2019-09-20 DIAGNOSIS — Z992 Dependence on renal dialysis: Secondary | ICD-10-CM | POA: Diagnosis not present

## 2019-09-20 DIAGNOSIS — N186 End stage renal disease: Secondary | ICD-10-CM | POA: Diagnosis not present

## 2019-09-21 DIAGNOSIS — K739 Chronic hepatitis, unspecified: Secondary | ICD-10-CM | POA: Diagnosis not present

## 2019-09-21 DIAGNOSIS — D689 Coagulation defect, unspecified: Secondary | ICD-10-CM | POA: Diagnosis not present

## 2019-09-21 DIAGNOSIS — Z992 Dependence on renal dialysis: Secondary | ICD-10-CM | POA: Diagnosis not present

## 2019-09-21 DIAGNOSIS — N186 End stage renal disease: Secondary | ICD-10-CM | POA: Diagnosis not present

## 2019-09-21 DIAGNOSIS — N2581 Secondary hyperparathyroidism of renal origin: Secondary | ICD-10-CM | POA: Diagnosis not present

## 2019-09-21 DIAGNOSIS — E039 Hypothyroidism, unspecified: Secondary | ICD-10-CM | POA: Diagnosis not present

## 2019-09-21 DIAGNOSIS — D631 Anemia in chronic kidney disease: Secondary | ICD-10-CM | POA: Diagnosis not present

## 2019-09-23 DIAGNOSIS — N2581 Secondary hyperparathyroidism of renal origin: Secondary | ICD-10-CM | POA: Diagnosis not present

## 2019-09-23 DIAGNOSIS — N186 End stage renal disease: Secondary | ICD-10-CM | POA: Diagnosis not present

## 2019-09-23 DIAGNOSIS — D631 Anemia in chronic kidney disease: Secondary | ICD-10-CM | POA: Diagnosis not present

## 2019-09-23 DIAGNOSIS — Z992 Dependence on renal dialysis: Secondary | ICD-10-CM | POA: Diagnosis not present

## 2019-09-23 DIAGNOSIS — T8612 Kidney transplant failure: Secondary | ICD-10-CM | POA: Diagnosis not present

## 2019-09-23 DIAGNOSIS — K739 Chronic hepatitis, unspecified: Secondary | ICD-10-CM | POA: Diagnosis not present

## 2019-09-23 DIAGNOSIS — E039 Hypothyroidism, unspecified: Secondary | ICD-10-CM | POA: Diagnosis not present

## 2019-09-23 DIAGNOSIS — D689 Coagulation defect, unspecified: Secondary | ICD-10-CM | POA: Diagnosis not present

## 2019-09-26 DIAGNOSIS — Z992 Dependence on renal dialysis: Secondary | ICD-10-CM | POA: Diagnosis not present

## 2019-09-26 DIAGNOSIS — E1129 Type 2 diabetes mellitus with other diabetic kidney complication: Secondary | ICD-10-CM | POA: Diagnosis not present

## 2019-09-26 DIAGNOSIS — N186 End stage renal disease: Secondary | ICD-10-CM | POA: Diagnosis not present

## 2019-09-26 DIAGNOSIS — E039 Hypothyroidism, unspecified: Secondary | ICD-10-CM | POA: Diagnosis not present

## 2019-09-26 DIAGNOSIS — D631 Anemia in chronic kidney disease: Secondary | ICD-10-CM | POA: Diagnosis not present

## 2019-09-26 DIAGNOSIS — N2581 Secondary hyperparathyroidism of renal origin: Secondary | ICD-10-CM | POA: Diagnosis not present

## 2019-09-26 DIAGNOSIS — K739 Chronic hepatitis, unspecified: Secondary | ICD-10-CM | POA: Diagnosis not present

## 2019-09-26 DIAGNOSIS — D689 Coagulation defect, unspecified: Secondary | ICD-10-CM | POA: Diagnosis not present

## 2019-09-26 DIAGNOSIS — E785 Hyperlipidemia, unspecified: Secondary | ICD-10-CM | POA: Diagnosis not present

## 2019-09-28 DIAGNOSIS — K739 Chronic hepatitis, unspecified: Secondary | ICD-10-CM | POA: Diagnosis not present

## 2019-09-28 DIAGNOSIS — E1129 Type 2 diabetes mellitus with other diabetic kidney complication: Secondary | ICD-10-CM | POA: Diagnosis not present

## 2019-09-28 DIAGNOSIS — D689 Coagulation defect, unspecified: Secondary | ICD-10-CM | POA: Diagnosis not present

## 2019-09-28 DIAGNOSIS — Z992 Dependence on renal dialysis: Secondary | ICD-10-CM | POA: Diagnosis not present

## 2019-09-28 DIAGNOSIS — E785 Hyperlipidemia, unspecified: Secondary | ICD-10-CM | POA: Diagnosis not present

## 2019-09-28 DIAGNOSIS — N186 End stage renal disease: Secondary | ICD-10-CM | POA: Diagnosis not present

## 2019-09-28 DIAGNOSIS — D631 Anemia in chronic kidney disease: Secondary | ICD-10-CM | POA: Diagnosis not present

## 2019-09-28 DIAGNOSIS — E039 Hypothyroidism, unspecified: Secondary | ICD-10-CM | POA: Diagnosis not present

## 2019-09-28 DIAGNOSIS — N2581 Secondary hyperparathyroidism of renal origin: Secondary | ICD-10-CM | POA: Diagnosis not present

## 2019-09-30 DIAGNOSIS — E785 Hyperlipidemia, unspecified: Secondary | ICD-10-CM | POA: Diagnosis not present

## 2019-09-30 DIAGNOSIS — E039 Hypothyroidism, unspecified: Secondary | ICD-10-CM | POA: Diagnosis not present

## 2019-09-30 DIAGNOSIS — K739 Chronic hepatitis, unspecified: Secondary | ICD-10-CM | POA: Diagnosis not present

## 2019-09-30 DIAGNOSIS — D631 Anemia in chronic kidney disease: Secondary | ICD-10-CM | POA: Diagnosis not present

## 2019-09-30 DIAGNOSIS — E1129 Type 2 diabetes mellitus with other diabetic kidney complication: Secondary | ICD-10-CM | POA: Diagnosis not present

## 2019-09-30 DIAGNOSIS — D689 Coagulation defect, unspecified: Secondary | ICD-10-CM | POA: Diagnosis not present

## 2019-09-30 DIAGNOSIS — Z992 Dependence on renal dialysis: Secondary | ICD-10-CM | POA: Diagnosis not present

## 2019-09-30 DIAGNOSIS — N2581 Secondary hyperparathyroidism of renal origin: Secondary | ICD-10-CM | POA: Diagnosis not present

## 2019-09-30 DIAGNOSIS — N186 End stage renal disease: Secondary | ICD-10-CM | POA: Diagnosis not present

## 2019-10-02 DIAGNOSIS — E785 Hyperlipidemia, unspecified: Secondary | ICD-10-CM | POA: Diagnosis not present

## 2019-10-02 DIAGNOSIS — Z992 Dependence on renal dialysis: Secondary | ICD-10-CM | POA: Diagnosis not present

## 2019-10-02 DIAGNOSIS — D631 Anemia in chronic kidney disease: Secondary | ICD-10-CM | POA: Diagnosis not present

## 2019-10-02 DIAGNOSIS — N2581 Secondary hyperparathyroidism of renal origin: Secondary | ICD-10-CM | POA: Diagnosis not present

## 2019-10-02 DIAGNOSIS — K739 Chronic hepatitis, unspecified: Secondary | ICD-10-CM | POA: Diagnosis not present

## 2019-10-02 DIAGNOSIS — N186 End stage renal disease: Secondary | ICD-10-CM | POA: Diagnosis not present

## 2019-10-02 DIAGNOSIS — E039 Hypothyroidism, unspecified: Secondary | ICD-10-CM | POA: Diagnosis not present

## 2019-10-02 DIAGNOSIS — D689 Coagulation defect, unspecified: Secondary | ICD-10-CM | POA: Diagnosis not present

## 2019-10-02 DIAGNOSIS — E1129 Type 2 diabetes mellitus with other diabetic kidney complication: Secondary | ICD-10-CM | POA: Diagnosis not present

## 2019-10-05 DIAGNOSIS — E785 Hyperlipidemia, unspecified: Secondary | ICD-10-CM | POA: Diagnosis not present

## 2019-10-05 DIAGNOSIS — K739 Chronic hepatitis, unspecified: Secondary | ICD-10-CM | POA: Diagnosis not present

## 2019-10-05 DIAGNOSIS — E1129 Type 2 diabetes mellitus with other diabetic kidney complication: Secondary | ICD-10-CM | POA: Diagnosis not present

## 2019-10-05 DIAGNOSIS — N2581 Secondary hyperparathyroidism of renal origin: Secondary | ICD-10-CM | POA: Diagnosis not present

## 2019-10-05 DIAGNOSIS — Z992 Dependence on renal dialysis: Secondary | ICD-10-CM | POA: Diagnosis not present

## 2019-10-05 DIAGNOSIS — E039 Hypothyroidism, unspecified: Secondary | ICD-10-CM | POA: Diagnosis not present

## 2019-10-05 DIAGNOSIS — D689 Coagulation defect, unspecified: Secondary | ICD-10-CM | POA: Diagnosis not present

## 2019-10-05 DIAGNOSIS — D631 Anemia in chronic kidney disease: Secondary | ICD-10-CM | POA: Diagnosis not present

## 2019-10-05 DIAGNOSIS — N186 End stage renal disease: Secondary | ICD-10-CM | POA: Diagnosis not present

## 2019-10-07 DIAGNOSIS — D631 Anemia in chronic kidney disease: Secondary | ICD-10-CM | POA: Diagnosis not present

## 2019-10-07 DIAGNOSIS — N2581 Secondary hyperparathyroidism of renal origin: Secondary | ICD-10-CM | POA: Diagnosis not present

## 2019-10-07 DIAGNOSIS — E039 Hypothyroidism, unspecified: Secondary | ICD-10-CM | POA: Diagnosis not present

## 2019-10-07 DIAGNOSIS — E785 Hyperlipidemia, unspecified: Secondary | ICD-10-CM | POA: Diagnosis not present

## 2019-10-07 DIAGNOSIS — K739 Chronic hepatitis, unspecified: Secondary | ICD-10-CM | POA: Diagnosis not present

## 2019-10-07 DIAGNOSIS — N186 End stage renal disease: Secondary | ICD-10-CM | POA: Diagnosis not present

## 2019-10-07 DIAGNOSIS — Z992 Dependence on renal dialysis: Secondary | ICD-10-CM | POA: Diagnosis not present

## 2019-10-07 DIAGNOSIS — D689 Coagulation defect, unspecified: Secondary | ICD-10-CM | POA: Diagnosis not present

## 2019-10-07 DIAGNOSIS — E1129 Type 2 diabetes mellitus with other diabetic kidney complication: Secondary | ICD-10-CM | POA: Diagnosis not present

## 2019-10-09 DIAGNOSIS — E1129 Type 2 diabetes mellitus with other diabetic kidney complication: Secondary | ICD-10-CM | POA: Diagnosis not present

## 2019-10-09 DIAGNOSIS — N186 End stage renal disease: Secondary | ICD-10-CM | POA: Diagnosis not present

## 2019-10-09 DIAGNOSIS — E785 Hyperlipidemia, unspecified: Secondary | ICD-10-CM | POA: Diagnosis not present

## 2019-10-09 DIAGNOSIS — D631 Anemia in chronic kidney disease: Secondary | ICD-10-CM | POA: Diagnosis not present

## 2019-10-09 DIAGNOSIS — N2581 Secondary hyperparathyroidism of renal origin: Secondary | ICD-10-CM | POA: Diagnosis not present

## 2019-10-09 DIAGNOSIS — D689 Coagulation defect, unspecified: Secondary | ICD-10-CM | POA: Diagnosis not present

## 2019-10-09 DIAGNOSIS — E039 Hypothyroidism, unspecified: Secondary | ICD-10-CM | POA: Diagnosis not present

## 2019-10-09 DIAGNOSIS — Z992 Dependence on renal dialysis: Secondary | ICD-10-CM | POA: Diagnosis not present

## 2019-10-09 DIAGNOSIS — K739 Chronic hepatitis, unspecified: Secondary | ICD-10-CM | POA: Diagnosis not present

## 2019-10-12 DIAGNOSIS — E785 Hyperlipidemia, unspecified: Secondary | ICD-10-CM | POA: Diagnosis not present

## 2019-10-12 DIAGNOSIS — E1129 Type 2 diabetes mellitus with other diabetic kidney complication: Secondary | ICD-10-CM | POA: Diagnosis not present

## 2019-10-12 DIAGNOSIS — D631 Anemia in chronic kidney disease: Secondary | ICD-10-CM | POA: Diagnosis not present

## 2019-10-12 DIAGNOSIS — K739 Chronic hepatitis, unspecified: Secondary | ICD-10-CM | POA: Diagnosis not present

## 2019-10-12 DIAGNOSIS — Z992 Dependence on renal dialysis: Secondary | ICD-10-CM | POA: Diagnosis not present

## 2019-10-12 DIAGNOSIS — D689 Coagulation defect, unspecified: Secondary | ICD-10-CM | POA: Diagnosis not present

## 2019-10-12 DIAGNOSIS — E039 Hypothyroidism, unspecified: Secondary | ICD-10-CM | POA: Diagnosis not present

## 2019-10-12 DIAGNOSIS — N186 End stage renal disease: Secondary | ICD-10-CM | POA: Diagnosis not present

## 2019-10-12 DIAGNOSIS — N2581 Secondary hyperparathyroidism of renal origin: Secondary | ICD-10-CM | POA: Diagnosis not present

## 2019-10-14 DIAGNOSIS — D689 Coagulation defect, unspecified: Secondary | ICD-10-CM | POA: Diagnosis not present

## 2019-10-14 DIAGNOSIS — K739 Chronic hepatitis, unspecified: Secondary | ICD-10-CM | POA: Diagnosis not present

## 2019-10-14 DIAGNOSIS — N186 End stage renal disease: Secondary | ICD-10-CM | POA: Diagnosis not present

## 2019-10-14 DIAGNOSIS — D631 Anemia in chronic kidney disease: Secondary | ICD-10-CM | POA: Diagnosis not present

## 2019-10-14 DIAGNOSIS — E1129 Type 2 diabetes mellitus with other diabetic kidney complication: Secondary | ICD-10-CM | POA: Diagnosis not present

## 2019-10-14 DIAGNOSIS — N2581 Secondary hyperparathyroidism of renal origin: Secondary | ICD-10-CM | POA: Diagnosis not present

## 2019-10-14 DIAGNOSIS — Z992 Dependence on renal dialysis: Secondary | ICD-10-CM | POA: Diagnosis not present

## 2019-10-14 DIAGNOSIS — E039 Hypothyroidism, unspecified: Secondary | ICD-10-CM | POA: Diagnosis not present

## 2019-10-14 DIAGNOSIS — E785 Hyperlipidemia, unspecified: Secondary | ICD-10-CM | POA: Diagnosis not present

## 2019-10-16 DIAGNOSIS — E1129 Type 2 diabetes mellitus with other diabetic kidney complication: Secondary | ICD-10-CM | POA: Diagnosis not present

## 2019-10-16 DIAGNOSIS — E785 Hyperlipidemia, unspecified: Secondary | ICD-10-CM | POA: Diagnosis not present

## 2019-10-16 DIAGNOSIS — D689 Coagulation defect, unspecified: Secondary | ICD-10-CM | POA: Diagnosis not present

## 2019-10-16 DIAGNOSIS — K739 Chronic hepatitis, unspecified: Secondary | ICD-10-CM | POA: Diagnosis not present

## 2019-10-16 DIAGNOSIS — D631 Anemia in chronic kidney disease: Secondary | ICD-10-CM | POA: Diagnosis not present

## 2019-10-16 DIAGNOSIS — N2581 Secondary hyperparathyroidism of renal origin: Secondary | ICD-10-CM | POA: Diagnosis not present

## 2019-10-16 DIAGNOSIS — Z992 Dependence on renal dialysis: Secondary | ICD-10-CM | POA: Diagnosis not present

## 2019-10-16 DIAGNOSIS — E039 Hypothyroidism, unspecified: Secondary | ICD-10-CM | POA: Diagnosis not present

## 2019-10-16 DIAGNOSIS — N186 End stage renal disease: Secondary | ICD-10-CM | POA: Diagnosis not present

## 2019-10-19 DIAGNOSIS — E039 Hypothyroidism, unspecified: Secondary | ICD-10-CM | POA: Diagnosis not present

## 2019-10-19 DIAGNOSIS — E785 Hyperlipidemia, unspecified: Secondary | ICD-10-CM | POA: Diagnosis not present

## 2019-10-19 DIAGNOSIS — N2581 Secondary hyperparathyroidism of renal origin: Secondary | ICD-10-CM | POA: Diagnosis not present

## 2019-10-19 DIAGNOSIS — K739 Chronic hepatitis, unspecified: Secondary | ICD-10-CM | POA: Diagnosis not present

## 2019-10-19 DIAGNOSIS — N186 End stage renal disease: Secondary | ICD-10-CM | POA: Diagnosis not present

## 2019-10-19 DIAGNOSIS — D689 Coagulation defect, unspecified: Secondary | ICD-10-CM | POA: Diagnosis not present

## 2019-10-19 DIAGNOSIS — E1129 Type 2 diabetes mellitus with other diabetic kidney complication: Secondary | ICD-10-CM | POA: Diagnosis not present

## 2019-10-19 DIAGNOSIS — D631 Anemia in chronic kidney disease: Secondary | ICD-10-CM | POA: Diagnosis not present

## 2019-10-19 DIAGNOSIS — Z992 Dependence on renal dialysis: Secondary | ICD-10-CM | POA: Diagnosis not present

## 2019-10-21 DIAGNOSIS — Z992 Dependence on renal dialysis: Secondary | ICD-10-CM | POA: Diagnosis not present

## 2019-10-21 DIAGNOSIS — D631 Anemia in chronic kidney disease: Secondary | ICD-10-CM | POA: Diagnosis not present

## 2019-10-21 DIAGNOSIS — E039 Hypothyroidism, unspecified: Secondary | ICD-10-CM | POA: Diagnosis not present

## 2019-10-21 DIAGNOSIS — E785 Hyperlipidemia, unspecified: Secondary | ICD-10-CM | POA: Diagnosis not present

## 2019-10-21 DIAGNOSIS — D689 Coagulation defect, unspecified: Secondary | ICD-10-CM | POA: Diagnosis not present

## 2019-10-21 DIAGNOSIS — E1129 Type 2 diabetes mellitus with other diabetic kidney complication: Secondary | ICD-10-CM | POA: Diagnosis not present

## 2019-10-21 DIAGNOSIS — N2581 Secondary hyperparathyroidism of renal origin: Secondary | ICD-10-CM | POA: Diagnosis not present

## 2019-10-21 DIAGNOSIS — K739 Chronic hepatitis, unspecified: Secondary | ICD-10-CM | POA: Diagnosis not present

## 2019-10-21 DIAGNOSIS — N186 End stage renal disease: Secondary | ICD-10-CM | POA: Diagnosis not present

## 2019-10-23 DIAGNOSIS — K739 Chronic hepatitis, unspecified: Secondary | ICD-10-CM | POA: Diagnosis not present

## 2019-10-23 DIAGNOSIS — E039 Hypothyroidism, unspecified: Secondary | ICD-10-CM | POA: Diagnosis not present

## 2019-10-23 DIAGNOSIS — E1129 Type 2 diabetes mellitus with other diabetic kidney complication: Secondary | ICD-10-CM | POA: Diagnosis not present

## 2019-10-23 DIAGNOSIS — N2581 Secondary hyperparathyroidism of renal origin: Secondary | ICD-10-CM | POA: Diagnosis not present

## 2019-10-23 DIAGNOSIS — D689 Coagulation defect, unspecified: Secondary | ICD-10-CM | POA: Diagnosis not present

## 2019-10-23 DIAGNOSIS — Z992 Dependence on renal dialysis: Secondary | ICD-10-CM | POA: Diagnosis not present

## 2019-10-23 DIAGNOSIS — N186 End stage renal disease: Secondary | ICD-10-CM | POA: Diagnosis not present

## 2019-10-23 DIAGNOSIS — D631 Anemia in chronic kidney disease: Secondary | ICD-10-CM | POA: Diagnosis not present

## 2019-10-23 DIAGNOSIS — E785 Hyperlipidemia, unspecified: Secondary | ICD-10-CM | POA: Diagnosis not present

## 2019-10-24 DIAGNOSIS — Z992 Dependence on renal dialysis: Secondary | ICD-10-CM | POA: Diagnosis not present

## 2019-10-24 DIAGNOSIS — N186 End stage renal disease: Secondary | ICD-10-CM | POA: Diagnosis not present

## 2019-10-24 DIAGNOSIS — T8612 Kidney transplant failure: Secondary | ICD-10-CM | POA: Diagnosis not present

## 2019-10-26 DIAGNOSIS — Z992 Dependence on renal dialysis: Secondary | ICD-10-CM | POA: Diagnosis not present

## 2019-10-26 DIAGNOSIS — E039 Hypothyroidism, unspecified: Secondary | ICD-10-CM | POA: Diagnosis not present

## 2019-10-26 DIAGNOSIS — N186 End stage renal disease: Secondary | ICD-10-CM | POA: Diagnosis not present

## 2019-10-26 DIAGNOSIS — K739 Chronic hepatitis, unspecified: Secondary | ICD-10-CM | POA: Diagnosis not present

## 2019-10-26 DIAGNOSIS — N2581 Secondary hyperparathyroidism of renal origin: Secondary | ICD-10-CM | POA: Diagnosis not present

## 2019-10-26 DIAGNOSIS — D689 Coagulation defect, unspecified: Secondary | ICD-10-CM | POA: Diagnosis not present

## 2019-10-28 DIAGNOSIS — N186 End stage renal disease: Secondary | ICD-10-CM | POA: Diagnosis not present

## 2019-10-28 DIAGNOSIS — K739 Chronic hepatitis, unspecified: Secondary | ICD-10-CM | POA: Diagnosis not present

## 2019-10-28 DIAGNOSIS — D689 Coagulation defect, unspecified: Secondary | ICD-10-CM | POA: Diagnosis not present

## 2019-10-28 DIAGNOSIS — N2581 Secondary hyperparathyroidism of renal origin: Secondary | ICD-10-CM | POA: Diagnosis not present

## 2019-10-28 DIAGNOSIS — Z992 Dependence on renal dialysis: Secondary | ICD-10-CM | POA: Diagnosis not present

## 2019-10-28 DIAGNOSIS — E039 Hypothyroidism, unspecified: Secondary | ICD-10-CM | POA: Diagnosis not present

## 2019-10-30 DIAGNOSIS — K739 Chronic hepatitis, unspecified: Secondary | ICD-10-CM | POA: Diagnosis not present

## 2019-10-30 DIAGNOSIS — N2581 Secondary hyperparathyroidism of renal origin: Secondary | ICD-10-CM | POA: Diagnosis not present

## 2019-10-30 DIAGNOSIS — D689 Coagulation defect, unspecified: Secondary | ICD-10-CM | POA: Diagnosis not present

## 2019-10-30 DIAGNOSIS — N186 End stage renal disease: Secondary | ICD-10-CM | POA: Diagnosis not present

## 2019-10-30 DIAGNOSIS — Z992 Dependence on renal dialysis: Secondary | ICD-10-CM | POA: Diagnosis not present

## 2019-10-30 DIAGNOSIS — E039 Hypothyroidism, unspecified: Secondary | ICD-10-CM | POA: Diagnosis not present

## 2019-11-02 DIAGNOSIS — D689 Coagulation defect, unspecified: Secondary | ICD-10-CM | POA: Diagnosis not present

## 2019-11-02 DIAGNOSIS — N186 End stage renal disease: Secondary | ICD-10-CM | POA: Diagnosis not present

## 2019-11-02 DIAGNOSIS — K739 Chronic hepatitis, unspecified: Secondary | ICD-10-CM | POA: Diagnosis not present

## 2019-11-02 DIAGNOSIS — Z992 Dependence on renal dialysis: Secondary | ICD-10-CM | POA: Diagnosis not present

## 2019-11-02 DIAGNOSIS — E039 Hypothyroidism, unspecified: Secondary | ICD-10-CM | POA: Diagnosis not present

## 2019-11-02 DIAGNOSIS — N2581 Secondary hyperparathyroidism of renal origin: Secondary | ICD-10-CM | POA: Diagnosis not present

## 2019-11-04 DIAGNOSIS — Z992 Dependence on renal dialysis: Secondary | ICD-10-CM | POA: Diagnosis not present

## 2019-11-04 DIAGNOSIS — N2581 Secondary hyperparathyroidism of renal origin: Secondary | ICD-10-CM | POA: Diagnosis not present

## 2019-11-04 DIAGNOSIS — N186 End stage renal disease: Secondary | ICD-10-CM | POA: Diagnosis not present

## 2019-11-04 DIAGNOSIS — K739 Chronic hepatitis, unspecified: Secondary | ICD-10-CM | POA: Diagnosis not present

## 2019-11-04 DIAGNOSIS — E039 Hypothyroidism, unspecified: Secondary | ICD-10-CM | POA: Diagnosis not present

## 2019-11-04 DIAGNOSIS — D689 Coagulation defect, unspecified: Secondary | ICD-10-CM | POA: Diagnosis not present

## 2019-11-05 DIAGNOSIS — N186 End stage renal disease: Secondary | ICD-10-CM | POA: Diagnosis not present

## 2019-11-05 DIAGNOSIS — R918 Other nonspecific abnormal finding of lung field: Secondary | ICD-10-CM | POA: Diagnosis not present

## 2019-11-05 DIAGNOSIS — M2550 Pain in unspecified joint: Secondary | ICD-10-CM | POA: Diagnosis not present

## 2019-11-05 DIAGNOSIS — M0579 Rheumatoid arthritis with rheumatoid factor of multiple sites without organ or systems involvement: Secondary | ICD-10-CM | POA: Diagnosis not present

## 2019-11-06 DIAGNOSIS — D689 Coagulation defect, unspecified: Secondary | ICD-10-CM | POA: Diagnosis not present

## 2019-11-06 DIAGNOSIS — E039 Hypothyroidism, unspecified: Secondary | ICD-10-CM | POA: Diagnosis not present

## 2019-11-06 DIAGNOSIS — N2581 Secondary hyperparathyroidism of renal origin: Secondary | ICD-10-CM | POA: Diagnosis not present

## 2019-11-06 DIAGNOSIS — Z992 Dependence on renal dialysis: Secondary | ICD-10-CM | POA: Diagnosis not present

## 2019-11-06 DIAGNOSIS — N186 End stage renal disease: Secondary | ICD-10-CM | POA: Diagnosis not present

## 2019-11-06 DIAGNOSIS — K739 Chronic hepatitis, unspecified: Secondary | ICD-10-CM | POA: Diagnosis not present

## 2019-11-09 DIAGNOSIS — Z992 Dependence on renal dialysis: Secondary | ICD-10-CM | POA: Diagnosis not present

## 2019-11-09 DIAGNOSIS — N2581 Secondary hyperparathyroidism of renal origin: Secondary | ICD-10-CM | POA: Diagnosis not present

## 2019-11-09 DIAGNOSIS — N186 End stage renal disease: Secondary | ICD-10-CM | POA: Diagnosis not present

## 2019-11-09 DIAGNOSIS — D689 Coagulation defect, unspecified: Secondary | ICD-10-CM | POA: Diagnosis not present

## 2019-11-09 DIAGNOSIS — E039 Hypothyroidism, unspecified: Secondary | ICD-10-CM | POA: Diagnosis not present

## 2019-11-09 DIAGNOSIS — K739 Chronic hepatitis, unspecified: Secondary | ICD-10-CM | POA: Diagnosis not present

## 2019-11-09 IMAGING — CR DG CHEST 2V
2 series · 2 of 2 positions shown · non-contrast
Comparison: CT 10/29/2017

CLINICAL DATA: Cough and fever for 1 week.

EXAM:
CHEST - 2 VIEW

[w chest pa]
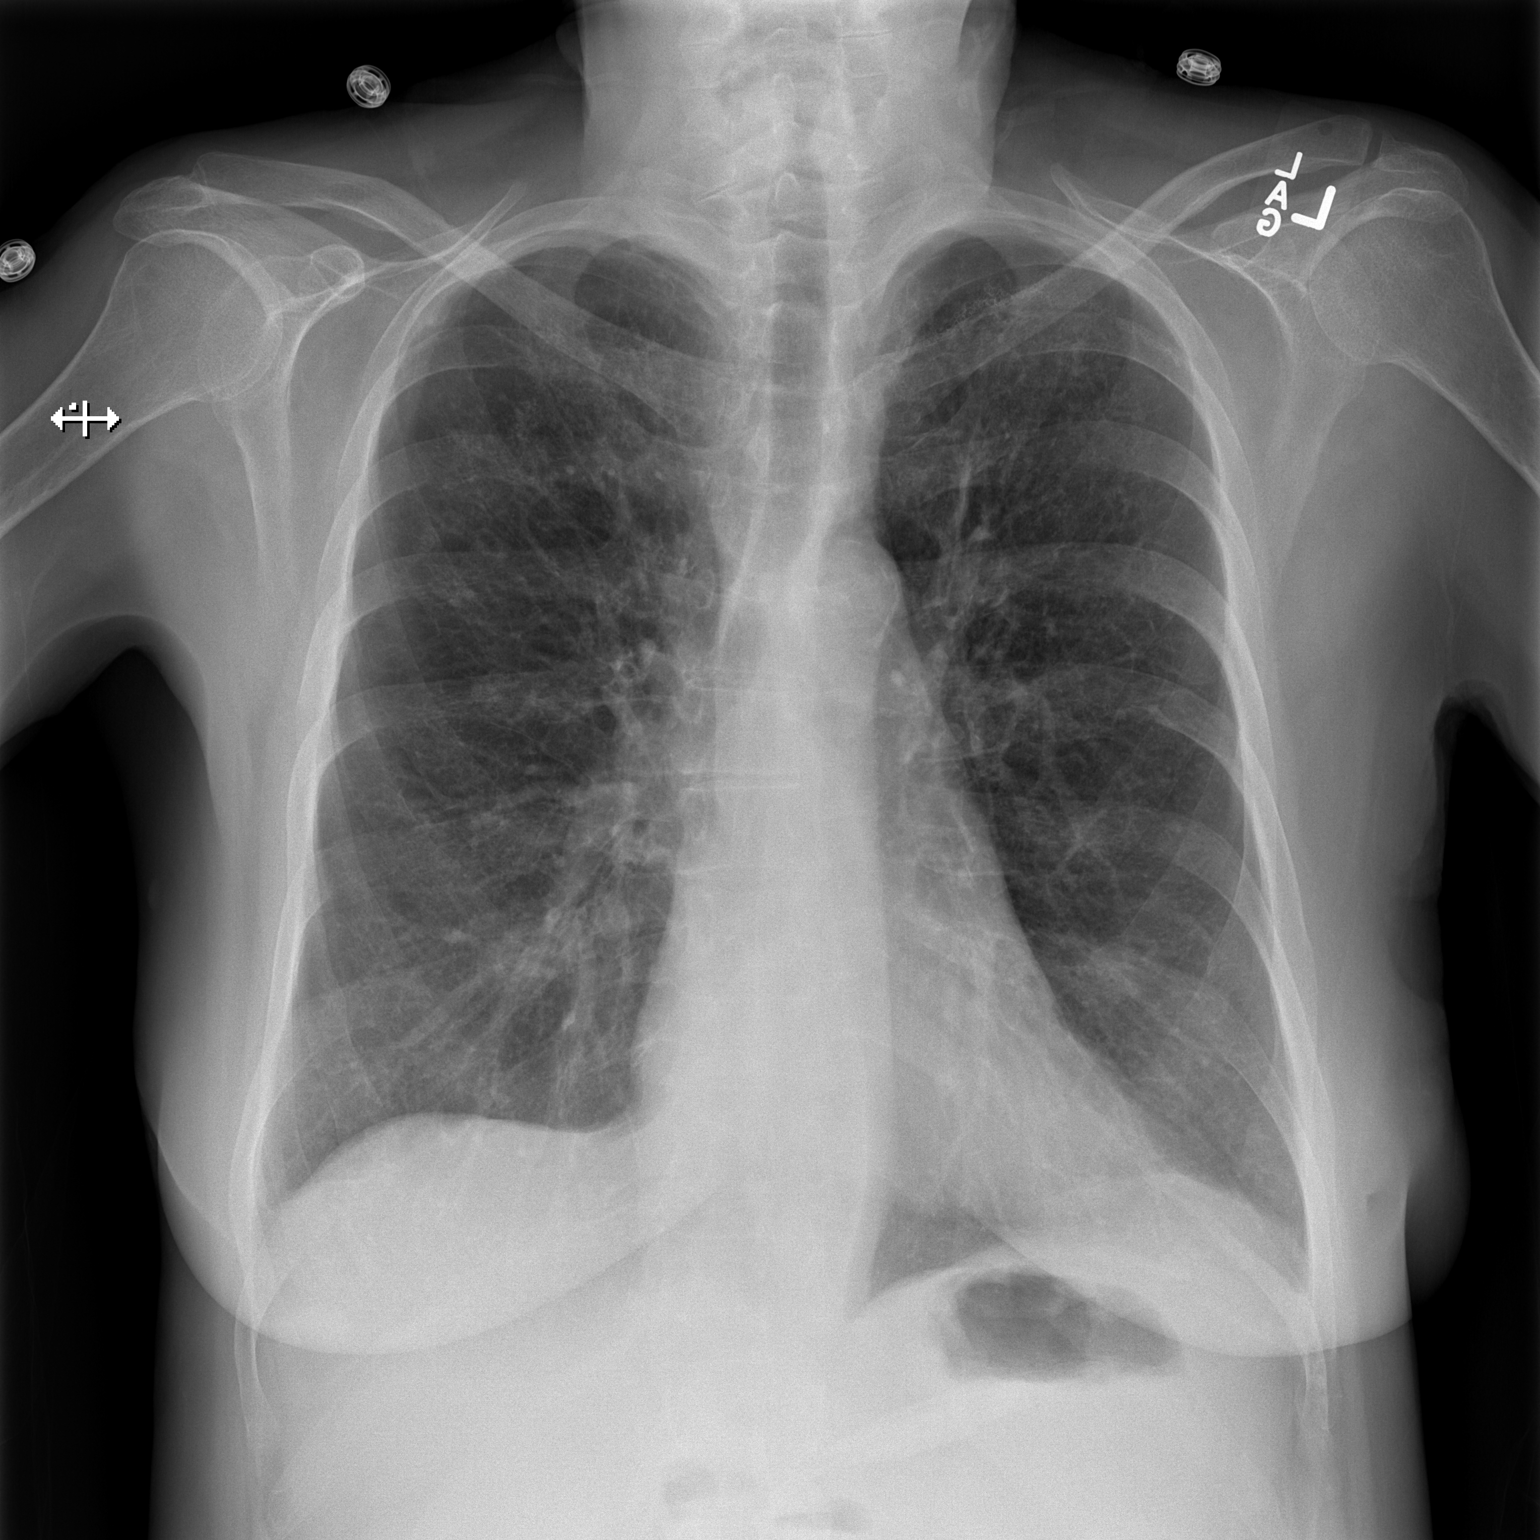

[w chest lat]
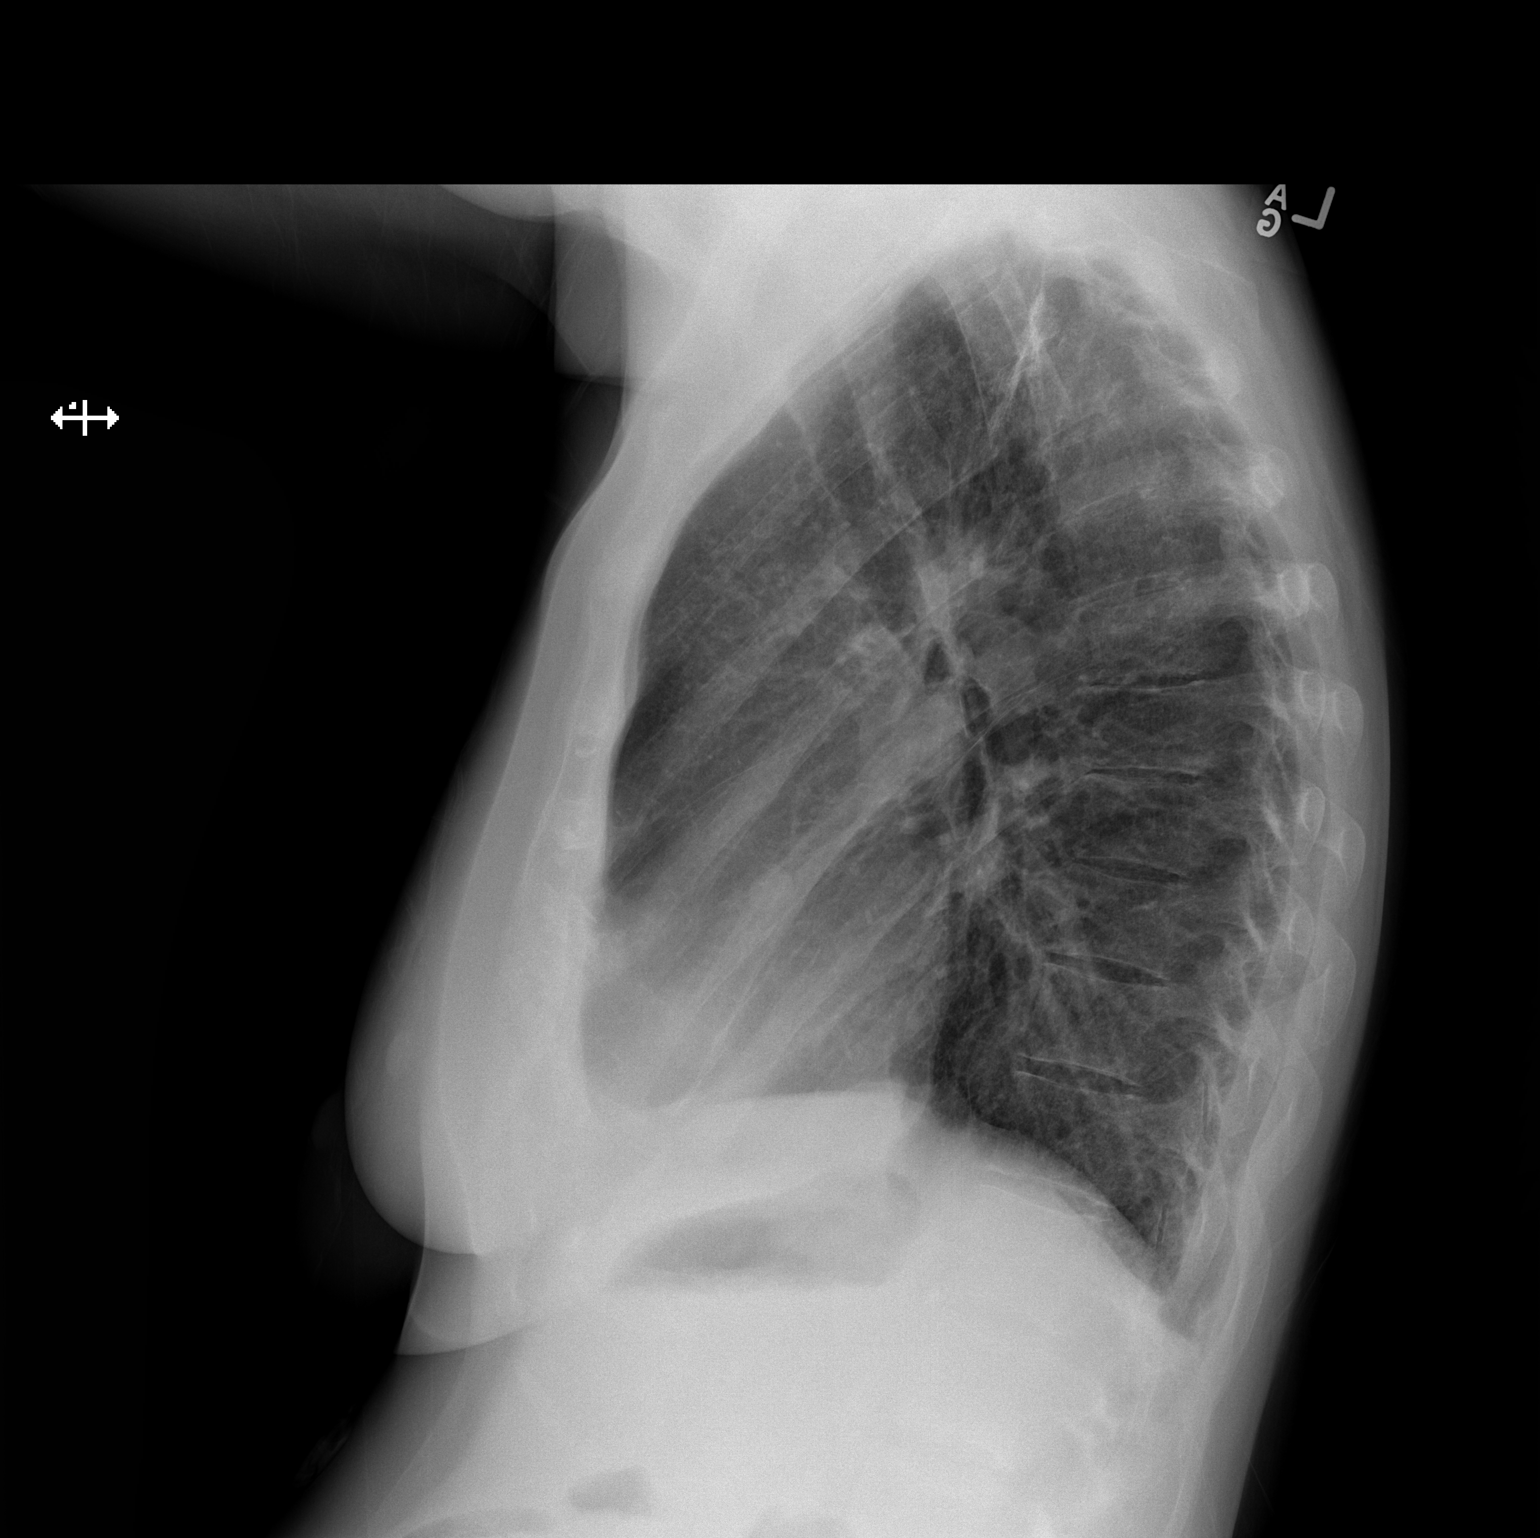

[2 of 2 positions shown; findings below may reference images not displayed]

FINDINGS: The heart is normal in size. Normal mediastinal contours. No
pulmonary edema. Small upper lobe predominant nodular opacities were
better characterized on prior CT. Chain sutures in the left upper
lung. No confluent airspace disease. No pleural effusion or
pneumothorax. No acute osseous abnormalities.
IMPRESSION: Small upper lobe predominant nodular densities were better
characterized on prior CT, likely secondary to chronic indolent
atypical infection such as PARI APAZA. No new abnormality.

## 2019-11-12 DIAGNOSIS — R05 Cough: Secondary | ICD-10-CM | POA: Diagnosis not present

## 2019-11-12 DIAGNOSIS — K219 Gastro-esophageal reflux disease without esophagitis: Secondary | ICD-10-CM | POA: Diagnosis not present

## 2019-11-12 DIAGNOSIS — J4550 Severe persistent asthma, uncomplicated: Secondary | ICD-10-CM | POA: Diagnosis not present

## 2019-11-12 DIAGNOSIS — R918 Other nonspecific abnormal finding of lung field: Secondary | ICD-10-CM | POA: Diagnosis not present

## 2019-11-13 DIAGNOSIS — K739 Chronic hepatitis, unspecified: Secondary | ICD-10-CM | POA: Diagnosis not present

## 2019-11-13 DIAGNOSIS — N2581 Secondary hyperparathyroidism of renal origin: Secondary | ICD-10-CM | POA: Diagnosis not present

## 2019-11-13 DIAGNOSIS — E039 Hypothyroidism, unspecified: Secondary | ICD-10-CM | POA: Diagnosis not present

## 2019-11-13 DIAGNOSIS — D689 Coagulation defect, unspecified: Secondary | ICD-10-CM | POA: Diagnosis not present

## 2019-11-13 DIAGNOSIS — Z992 Dependence on renal dialysis: Secondary | ICD-10-CM | POA: Diagnosis not present

## 2019-11-13 DIAGNOSIS — N186 End stage renal disease: Secondary | ICD-10-CM | POA: Diagnosis not present

## 2019-11-16 DIAGNOSIS — N186 End stage renal disease: Secondary | ICD-10-CM | POA: Diagnosis not present

## 2019-11-16 DIAGNOSIS — K739 Chronic hepatitis, unspecified: Secondary | ICD-10-CM | POA: Diagnosis not present

## 2019-11-16 DIAGNOSIS — N2581 Secondary hyperparathyroidism of renal origin: Secondary | ICD-10-CM | POA: Diagnosis not present

## 2019-11-16 DIAGNOSIS — D689 Coagulation defect, unspecified: Secondary | ICD-10-CM | POA: Diagnosis not present

## 2019-11-16 DIAGNOSIS — E039 Hypothyroidism, unspecified: Secondary | ICD-10-CM | POA: Diagnosis not present

## 2019-11-16 DIAGNOSIS — Z992 Dependence on renal dialysis: Secondary | ICD-10-CM | POA: Diagnosis not present

## 2019-11-18 DIAGNOSIS — K739 Chronic hepatitis, unspecified: Secondary | ICD-10-CM | POA: Diagnosis not present

## 2019-11-18 DIAGNOSIS — N2581 Secondary hyperparathyroidism of renal origin: Secondary | ICD-10-CM | POA: Diagnosis not present

## 2019-11-18 DIAGNOSIS — N186 End stage renal disease: Secondary | ICD-10-CM | POA: Diagnosis not present

## 2019-11-18 DIAGNOSIS — Z992 Dependence on renal dialysis: Secondary | ICD-10-CM | POA: Diagnosis not present

## 2019-11-18 DIAGNOSIS — E039 Hypothyroidism, unspecified: Secondary | ICD-10-CM | POA: Diagnosis not present

## 2019-11-18 DIAGNOSIS — D689 Coagulation defect, unspecified: Secondary | ICD-10-CM | POA: Diagnosis not present

## 2019-11-20 DIAGNOSIS — N186 End stage renal disease: Secondary | ICD-10-CM | POA: Diagnosis not present

## 2019-11-20 DIAGNOSIS — Z992 Dependence on renal dialysis: Secondary | ICD-10-CM | POA: Diagnosis not present

## 2019-11-20 DIAGNOSIS — N2581 Secondary hyperparathyroidism of renal origin: Secondary | ICD-10-CM | POA: Diagnosis not present

## 2019-11-20 DIAGNOSIS — K739 Chronic hepatitis, unspecified: Secondary | ICD-10-CM | POA: Diagnosis not present

## 2019-11-20 DIAGNOSIS — E039 Hypothyroidism, unspecified: Secondary | ICD-10-CM | POA: Diagnosis not present

## 2019-11-20 DIAGNOSIS — D689 Coagulation defect, unspecified: Secondary | ICD-10-CM | POA: Diagnosis not present

## 2019-11-21 DIAGNOSIS — T8612 Kidney transplant failure: Secondary | ICD-10-CM | POA: Diagnosis not present

## 2019-11-21 DIAGNOSIS — Z992 Dependence on renal dialysis: Secondary | ICD-10-CM | POA: Diagnosis not present

## 2019-11-21 DIAGNOSIS — N186 End stage renal disease: Secondary | ICD-10-CM | POA: Diagnosis not present

## 2019-11-22 DIAGNOSIS — Z0120 Encounter for dental examination and cleaning without abnormal findings: Secondary | ICD-10-CM | POA: Diagnosis not present

## 2019-11-23 DIAGNOSIS — N186 End stage renal disease: Secondary | ICD-10-CM | POA: Diagnosis not present

## 2019-11-23 DIAGNOSIS — Z992 Dependence on renal dialysis: Secondary | ICD-10-CM | POA: Diagnosis not present

## 2019-11-23 DIAGNOSIS — N2581 Secondary hyperparathyroidism of renal origin: Secondary | ICD-10-CM | POA: Diagnosis not present

## 2019-11-23 DIAGNOSIS — K739 Chronic hepatitis, unspecified: Secondary | ICD-10-CM | POA: Diagnosis not present

## 2019-11-23 DIAGNOSIS — D631 Anemia in chronic kidney disease: Secondary | ICD-10-CM | POA: Diagnosis not present

## 2019-11-23 DIAGNOSIS — D689 Coagulation defect, unspecified: Secondary | ICD-10-CM | POA: Diagnosis not present

## 2019-11-23 DIAGNOSIS — E039 Hypothyroidism, unspecified: Secondary | ICD-10-CM | POA: Diagnosis not present

## 2019-11-25 DIAGNOSIS — N2581 Secondary hyperparathyroidism of renal origin: Secondary | ICD-10-CM | POA: Diagnosis not present

## 2019-11-25 DIAGNOSIS — D631 Anemia in chronic kidney disease: Secondary | ICD-10-CM | POA: Diagnosis not present

## 2019-11-25 DIAGNOSIS — E039 Hypothyroidism, unspecified: Secondary | ICD-10-CM | POA: Diagnosis not present

## 2019-11-25 DIAGNOSIS — K739 Chronic hepatitis, unspecified: Secondary | ICD-10-CM | POA: Diagnosis not present

## 2019-11-25 DIAGNOSIS — N186 End stage renal disease: Secondary | ICD-10-CM | POA: Diagnosis not present

## 2019-11-25 DIAGNOSIS — D689 Coagulation defect, unspecified: Secondary | ICD-10-CM | POA: Diagnosis not present

## 2019-11-25 DIAGNOSIS — Z992 Dependence on renal dialysis: Secondary | ICD-10-CM | POA: Diagnosis not present

## 2019-11-27 DIAGNOSIS — D689 Coagulation defect, unspecified: Secondary | ICD-10-CM | POA: Diagnosis not present

## 2019-11-27 DIAGNOSIS — Z992 Dependence on renal dialysis: Secondary | ICD-10-CM | POA: Diagnosis not present

## 2019-11-27 DIAGNOSIS — K739 Chronic hepatitis, unspecified: Secondary | ICD-10-CM | POA: Diagnosis not present

## 2019-11-27 DIAGNOSIS — D631 Anemia in chronic kidney disease: Secondary | ICD-10-CM | POA: Diagnosis not present

## 2019-11-27 DIAGNOSIS — E039 Hypothyroidism, unspecified: Secondary | ICD-10-CM | POA: Diagnosis not present

## 2019-11-27 DIAGNOSIS — N2581 Secondary hyperparathyroidism of renal origin: Secondary | ICD-10-CM | POA: Diagnosis not present

## 2019-11-27 DIAGNOSIS — N186 End stage renal disease: Secondary | ICD-10-CM | POA: Diagnosis not present

## 2019-11-30 DIAGNOSIS — D689 Coagulation defect, unspecified: Secondary | ICD-10-CM | POA: Diagnosis not present

## 2019-11-30 DIAGNOSIS — N186 End stage renal disease: Secondary | ICD-10-CM | POA: Diagnosis not present

## 2019-11-30 DIAGNOSIS — Z992 Dependence on renal dialysis: Secondary | ICD-10-CM | POA: Diagnosis not present

## 2019-11-30 DIAGNOSIS — N2581 Secondary hyperparathyroidism of renal origin: Secondary | ICD-10-CM | POA: Diagnosis not present

## 2019-11-30 DIAGNOSIS — D631 Anemia in chronic kidney disease: Secondary | ICD-10-CM | POA: Diagnosis not present

## 2019-11-30 DIAGNOSIS — E039 Hypothyroidism, unspecified: Secondary | ICD-10-CM | POA: Diagnosis not present

## 2019-11-30 DIAGNOSIS — K739 Chronic hepatitis, unspecified: Secondary | ICD-10-CM | POA: Diagnosis not present

## 2019-12-02 DIAGNOSIS — N186 End stage renal disease: Secondary | ICD-10-CM | POA: Diagnosis not present

## 2019-12-02 DIAGNOSIS — E039 Hypothyroidism, unspecified: Secondary | ICD-10-CM | POA: Diagnosis not present

## 2019-12-02 DIAGNOSIS — D631 Anemia in chronic kidney disease: Secondary | ICD-10-CM | POA: Diagnosis not present

## 2019-12-02 DIAGNOSIS — N2581 Secondary hyperparathyroidism of renal origin: Secondary | ICD-10-CM | POA: Diagnosis not present

## 2019-12-02 DIAGNOSIS — Z992 Dependence on renal dialysis: Secondary | ICD-10-CM | POA: Diagnosis not present

## 2019-12-02 DIAGNOSIS — K739 Chronic hepatitis, unspecified: Secondary | ICD-10-CM | POA: Diagnosis not present

## 2019-12-02 DIAGNOSIS — D689 Coagulation defect, unspecified: Secondary | ICD-10-CM | POA: Diagnosis not present

## 2019-12-04 DIAGNOSIS — N2581 Secondary hyperparathyroidism of renal origin: Secondary | ICD-10-CM | POA: Diagnosis not present

## 2019-12-04 DIAGNOSIS — N186 End stage renal disease: Secondary | ICD-10-CM | POA: Diagnosis not present

## 2019-12-04 DIAGNOSIS — D631 Anemia in chronic kidney disease: Secondary | ICD-10-CM | POA: Diagnosis not present

## 2019-12-04 DIAGNOSIS — Z992 Dependence on renal dialysis: Secondary | ICD-10-CM | POA: Diagnosis not present

## 2019-12-04 DIAGNOSIS — D689 Coagulation defect, unspecified: Secondary | ICD-10-CM | POA: Diagnosis not present

## 2019-12-04 DIAGNOSIS — E039 Hypothyroidism, unspecified: Secondary | ICD-10-CM | POA: Diagnosis not present

## 2019-12-04 DIAGNOSIS — K739 Chronic hepatitis, unspecified: Secondary | ICD-10-CM | POA: Diagnosis not present

## 2019-12-07 DIAGNOSIS — Z992 Dependence on renal dialysis: Secondary | ICD-10-CM | POA: Diagnosis not present

## 2019-12-07 DIAGNOSIS — K739 Chronic hepatitis, unspecified: Secondary | ICD-10-CM | POA: Diagnosis not present

## 2019-12-07 DIAGNOSIS — D631 Anemia in chronic kidney disease: Secondary | ICD-10-CM | POA: Diagnosis not present

## 2019-12-07 DIAGNOSIS — N2581 Secondary hyperparathyroidism of renal origin: Secondary | ICD-10-CM | POA: Diagnosis not present

## 2019-12-07 DIAGNOSIS — D689 Coagulation defect, unspecified: Secondary | ICD-10-CM | POA: Diagnosis not present

## 2019-12-07 DIAGNOSIS — N186 End stage renal disease: Secondary | ICD-10-CM | POA: Diagnosis not present

## 2019-12-07 DIAGNOSIS — E039 Hypothyroidism, unspecified: Secondary | ICD-10-CM | POA: Diagnosis not present

## 2019-12-09 DIAGNOSIS — D689 Coagulation defect, unspecified: Secondary | ICD-10-CM | POA: Diagnosis not present

## 2019-12-09 DIAGNOSIS — E039 Hypothyroidism, unspecified: Secondary | ICD-10-CM | POA: Diagnosis not present

## 2019-12-09 DIAGNOSIS — D631 Anemia in chronic kidney disease: Secondary | ICD-10-CM | POA: Diagnosis not present

## 2019-12-09 DIAGNOSIS — N186 End stage renal disease: Secondary | ICD-10-CM | POA: Diagnosis not present

## 2019-12-09 DIAGNOSIS — N2581 Secondary hyperparathyroidism of renal origin: Secondary | ICD-10-CM | POA: Diagnosis not present

## 2019-12-09 DIAGNOSIS — Z992 Dependence on renal dialysis: Secondary | ICD-10-CM | POA: Diagnosis not present

## 2019-12-09 DIAGNOSIS — K739 Chronic hepatitis, unspecified: Secondary | ICD-10-CM | POA: Diagnosis not present

## 2019-12-11 DIAGNOSIS — N2581 Secondary hyperparathyroidism of renal origin: Secondary | ICD-10-CM | POA: Diagnosis not present

## 2019-12-11 DIAGNOSIS — N186 End stage renal disease: Secondary | ICD-10-CM | POA: Diagnosis not present

## 2019-12-11 DIAGNOSIS — K739 Chronic hepatitis, unspecified: Secondary | ICD-10-CM | POA: Diagnosis not present

## 2019-12-11 DIAGNOSIS — Z992 Dependence on renal dialysis: Secondary | ICD-10-CM | POA: Diagnosis not present

## 2019-12-11 DIAGNOSIS — D631 Anemia in chronic kidney disease: Secondary | ICD-10-CM | POA: Diagnosis not present

## 2019-12-11 DIAGNOSIS — E039 Hypothyroidism, unspecified: Secondary | ICD-10-CM | POA: Diagnosis not present

## 2019-12-11 DIAGNOSIS — D689 Coagulation defect, unspecified: Secondary | ICD-10-CM | POA: Diagnosis not present

## 2019-12-14 DIAGNOSIS — D631 Anemia in chronic kidney disease: Secondary | ICD-10-CM | POA: Diagnosis not present

## 2019-12-14 DIAGNOSIS — N2581 Secondary hyperparathyroidism of renal origin: Secondary | ICD-10-CM | POA: Diagnosis not present

## 2019-12-14 DIAGNOSIS — K739 Chronic hepatitis, unspecified: Secondary | ICD-10-CM | POA: Diagnosis not present

## 2019-12-14 DIAGNOSIS — D689 Coagulation defect, unspecified: Secondary | ICD-10-CM | POA: Diagnosis not present

## 2019-12-14 DIAGNOSIS — N186 End stage renal disease: Secondary | ICD-10-CM | POA: Diagnosis not present

## 2019-12-14 DIAGNOSIS — E039 Hypothyroidism, unspecified: Secondary | ICD-10-CM | POA: Diagnosis not present

## 2019-12-14 DIAGNOSIS — Z992 Dependence on renal dialysis: Secondary | ICD-10-CM | POA: Diagnosis not present

## 2019-12-16 DIAGNOSIS — Z992 Dependence on renal dialysis: Secondary | ICD-10-CM | POA: Diagnosis not present

## 2019-12-16 DIAGNOSIS — K739 Chronic hepatitis, unspecified: Secondary | ICD-10-CM | POA: Diagnosis not present

## 2019-12-16 DIAGNOSIS — D631 Anemia in chronic kidney disease: Secondary | ICD-10-CM | POA: Diagnosis not present

## 2019-12-16 DIAGNOSIS — N2581 Secondary hyperparathyroidism of renal origin: Secondary | ICD-10-CM | POA: Diagnosis not present

## 2019-12-16 DIAGNOSIS — E039 Hypothyroidism, unspecified: Secondary | ICD-10-CM | POA: Diagnosis not present

## 2019-12-16 DIAGNOSIS — N186 End stage renal disease: Secondary | ICD-10-CM | POA: Diagnosis not present

## 2019-12-16 DIAGNOSIS — D689 Coagulation defect, unspecified: Secondary | ICD-10-CM | POA: Diagnosis not present

## 2019-12-18 DIAGNOSIS — D689 Coagulation defect, unspecified: Secondary | ICD-10-CM | POA: Diagnosis not present

## 2019-12-18 DIAGNOSIS — K739 Chronic hepatitis, unspecified: Secondary | ICD-10-CM | POA: Diagnosis not present

## 2019-12-18 DIAGNOSIS — E039 Hypothyroidism, unspecified: Secondary | ICD-10-CM | POA: Diagnosis not present

## 2019-12-18 DIAGNOSIS — Z992 Dependence on renal dialysis: Secondary | ICD-10-CM | POA: Diagnosis not present

## 2019-12-18 DIAGNOSIS — D631 Anemia in chronic kidney disease: Secondary | ICD-10-CM | POA: Diagnosis not present

## 2019-12-18 DIAGNOSIS — N186 End stage renal disease: Secondary | ICD-10-CM | POA: Diagnosis not present

## 2019-12-18 DIAGNOSIS — N2581 Secondary hyperparathyroidism of renal origin: Secondary | ICD-10-CM | POA: Diagnosis not present

## 2019-12-20 DIAGNOSIS — I10 Essential (primary) hypertension: Secondary | ICD-10-CM | POA: Diagnosis not present

## 2019-12-20 DIAGNOSIS — E039 Hypothyroidism, unspecified: Secondary | ICD-10-CM | POA: Diagnosis not present

## 2019-12-20 DIAGNOSIS — J45909 Unspecified asthma, uncomplicated: Secondary | ICD-10-CM | POA: Diagnosis not present

## 2019-12-20 DIAGNOSIS — M810 Age-related osteoporosis without current pathological fracture: Secondary | ICD-10-CM | POA: Diagnosis not present

## 2019-12-21 DIAGNOSIS — E039 Hypothyroidism, unspecified: Secondary | ICD-10-CM | POA: Diagnosis not present

## 2019-12-21 DIAGNOSIS — K739 Chronic hepatitis, unspecified: Secondary | ICD-10-CM | POA: Diagnosis not present

## 2019-12-21 DIAGNOSIS — Z992 Dependence on renal dialysis: Secondary | ICD-10-CM | POA: Diagnosis not present

## 2019-12-21 DIAGNOSIS — D689 Coagulation defect, unspecified: Secondary | ICD-10-CM | POA: Diagnosis not present

## 2019-12-21 DIAGNOSIS — D631 Anemia in chronic kidney disease: Secondary | ICD-10-CM | POA: Diagnosis not present

## 2019-12-21 DIAGNOSIS — N186 End stage renal disease: Secondary | ICD-10-CM | POA: Diagnosis not present

## 2019-12-21 DIAGNOSIS — N2581 Secondary hyperparathyroidism of renal origin: Secondary | ICD-10-CM | POA: Diagnosis not present

## 2019-12-22 DIAGNOSIS — N186 End stage renal disease: Secondary | ICD-10-CM | POA: Diagnosis not present

## 2019-12-22 DIAGNOSIS — Z992 Dependence on renal dialysis: Secondary | ICD-10-CM | POA: Diagnosis not present

## 2019-12-22 DIAGNOSIS — T8612 Kidney transplant failure: Secondary | ICD-10-CM | POA: Diagnosis not present

## 2019-12-23 DIAGNOSIS — N2581 Secondary hyperparathyroidism of renal origin: Secondary | ICD-10-CM | POA: Diagnosis not present

## 2019-12-23 DIAGNOSIS — D689 Coagulation defect, unspecified: Secondary | ICD-10-CM | POA: Diagnosis not present

## 2019-12-23 DIAGNOSIS — D509 Iron deficiency anemia, unspecified: Secondary | ICD-10-CM | POA: Diagnosis not present

## 2019-12-23 DIAGNOSIS — Z992 Dependence on renal dialysis: Secondary | ICD-10-CM | POA: Diagnosis not present

## 2019-12-23 DIAGNOSIS — E1129 Type 2 diabetes mellitus with other diabetic kidney complication: Secondary | ICD-10-CM | POA: Diagnosis not present

## 2019-12-23 DIAGNOSIS — E039 Hypothyroidism, unspecified: Secondary | ICD-10-CM | POA: Diagnosis not present

## 2019-12-23 DIAGNOSIS — N186 End stage renal disease: Secondary | ICD-10-CM | POA: Diagnosis not present

## 2019-12-23 DIAGNOSIS — K739 Chronic hepatitis, unspecified: Secondary | ICD-10-CM | POA: Diagnosis not present

## 2019-12-25 DIAGNOSIS — N186 End stage renal disease: Secondary | ICD-10-CM | POA: Diagnosis not present

## 2019-12-25 DIAGNOSIS — D689 Coagulation defect, unspecified: Secondary | ICD-10-CM | POA: Diagnosis not present

## 2019-12-25 DIAGNOSIS — E1129 Type 2 diabetes mellitus with other diabetic kidney complication: Secondary | ICD-10-CM | POA: Diagnosis not present

## 2019-12-25 DIAGNOSIS — Z992 Dependence on renal dialysis: Secondary | ICD-10-CM | POA: Diagnosis not present

## 2019-12-25 DIAGNOSIS — D509 Iron deficiency anemia, unspecified: Secondary | ICD-10-CM | POA: Diagnosis not present

## 2019-12-25 DIAGNOSIS — E039 Hypothyroidism, unspecified: Secondary | ICD-10-CM | POA: Diagnosis not present

## 2019-12-25 DIAGNOSIS — K739 Chronic hepatitis, unspecified: Secondary | ICD-10-CM | POA: Diagnosis not present

## 2019-12-25 DIAGNOSIS — N2581 Secondary hyperparathyroidism of renal origin: Secondary | ICD-10-CM | POA: Diagnosis not present

## 2019-12-28 DIAGNOSIS — E1129 Type 2 diabetes mellitus with other diabetic kidney complication: Secondary | ICD-10-CM | POA: Diagnosis not present

## 2019-12-28 DIAGNOSIS — Z992 Dependence on renal dialysis: Secondary | ICD-10-CM | POA: Diagnosis not present

## 2019-12-28 DIAGNOSIS — E039 Hypothyroidism, unspecified: Secondary | ICD-10-CM | POA: Diagnosis not present

## 2019-12-28 DIAGNOSIS — K739 Chronic hepatitis, unspecified: Secondary | ICD-10-CM | POA: Diagnosis not present

## 2019-12-28 DIAGNOSIS — D509 Iron deficiency anemia, unspecified: Secondary | ICD-10-CM | POA: Diagnosis not present

## 2019-12-28 DIAGNOSIS — N2581 Secondary hyperparathyroidism of renal origin: Secondary | ICD-10-CM | POA: Diagnosis not present

## 2019-12-28 DIAGNOSIS — N186 End stage renal disease: Secondary | ICD-10-CM | POA: Diagnosis not present

## 2019-12-28 DIAGNOSIS — D689 Coagulation defect, unspecified: Secondary | ICD-10-CM | POA: Diagnosis not present

## 2019-12-30 DIAGNOSIS — D689 Coagulation defect, unspecified: Secondary | ICD-10-CM | POA: Diagnosis not present

## 2019-12-30 DIAGNOSIS — E1129 Type 2 diabetes mellitus with other diabetic kidney complication: Secondary | ICD-10-CM | POA: Diagnosis not present

## 2019-12-30 DIAGNOSIS — E039 Hypothyroidism, unspecified: Secondary | ICD-10-CM | POA: Diagnosis not present

## 2019-12-30 DIAGNOSIS — D509 Iron deficiency anemia, unspecified: Secondary | ICD-10-CM | POA: Diagnosis not present

## 2019-12-30 DIAGNOSIS — Z992 Dependence on renal dialysis: Secondary | ICD-10-CM | POA: Diagnosis not present

## 2019-12-30 DIAGNOSIS — K739 Chronic hepatitis, unspecified: Secondary | ICD-10-CM | POA: Diagnosis not present

## 2019-12-30 DIAGNOSIS — N2581 Secondary hyperparathyroidism of renal origin: Secondary | ICD-10-CM | POA: Diagnosis not present

## 2019-12-30 DIAGNOSIS — N186 End stage renal disease: Secondary | ICD-10-CM | POA: Diagnosis not present

## 2020-01-01 DIAGNOSIS — K739 Chronic hepatitis, unspecified: Secondary | ICD-10-CM | POA: Diagnosis not present

## 2020-01-01 DIAGNOSIS — E039 Hypothyroidism, unspecified: Secondary | ICD-10-CM | POA: Diagnosis not present

## 2020-01-01 DIAGNOSIS — N186 End stage renal disease: Secondary | ICD-10-CM | POA: Diagnosis not present

## 2020-01-01 DIAGNOSIS — E1129 Type 2 diabetes mellitus with other diabetic kidney complication: Secondary | ICD-10-CM | POA: Diagnosis not present

## 2020-01-01 DIAGNOSIS — D509 Iron deficiency anemia, unspecified: Secondary | ICD-10-CM | POA: Diagnosis not present

## 2020-01-01 DIAGNOSIS — Z992 Dependence on renal dialysis: Secondary | ICD-10-CM | POA: Diagnosis not present

## 2020-01-01 DIAGNOSIS — N2581 Secondary hyperparathyroidism of renal origin: Secondary | ICD-10-CM | POA: Diagnosis not present

## 2020-01-01 DIAGNOSIS — D689 Coagulation defect, unspecified: Secondary | ICD-10-CM | POA: Diagnosis not present

## 2020-01-04 DIAGNOSIS — K739 Chronic hepatitis, unspecified: Secondary | ICD-10-CM | POA: Diagnosis not present

## 2020-01-04 DIAGNOSIS — E1129 Type 2 diabetes mellitus with other diabetic kidney complication: Secondary | ICD-10-CM | POA: Diagnosis not present

## 2020-01-04 DIAGNOSIS — Z992 Dependence on renal dialysis: Secondary | ICD-10-CM | POA: Diagnosis not present

## 2020-01-04 DIAGNOSIS — N2581 Secondary hyperparathyroidism of renal origin: Secondary | ICD-10-CM | POA: Diagnosis not present

## 2020-01-04 DIAGNOSIS — D509 Iron deficiency anemia, unspecified: Secondary | ICD-10-CM | POA: Diagnosis not present

## 2020-01-04 DIAGNOSIS — N186 End stage renal disease: Secondary | ICD-10-CM | POA: Diagnosis not present

## 2020-01-04 DIAGNOSIS — D689 Coagulation defect, unspecified: Secondary | ICD-10-CM | POA: Diagnosis not present

## 2020-01-04 DIAGNOSIS — E039 Hypothyroidism, unspecified: Secondary | ICD-10-CM | POA: Diagnosis not present

## 2020-01-06 DIAGNOSIS — N186 End stage renal disease: Secondary | ICD-10-CM | POA: Diagnosis not present

## 2020-01-06 DIAGNOSIS — E039 Hypothyroidism, unspecified: Secondary | ICD-10-CM | POA: Diagnosis not present

## 2020-01-06 DIAGNOSIS — E1129 Type 2 diabetes mellitus with other diabetic kidney complication: Secondary | ICD-10-CM | POA: Diagnosis not present

## 2020-01-06 DIAGNOSIS — D509 Iron deficiency anemia, unspecified: Secondary | ICD-10-CM | POA: Diagnosis not present

## 2020-01-06 DIAGNOSIS — K739 Chronic hepatitis, unspecified: Secondary | ICD-10-CM | POA: Diagnosis not present

## 2020-01-06 DIAGNOSIS — N2581 Secondary hyperparathyroidism of renal origin: Secondary | ICD-10-CM | POA: Diagnosis not present

## 2020-01-06 DIAGNOSIS — D689 Coagulation defect, unspecified: Secondary | ICD-10-CM | POA: Diagnosis not present

## 2020-01-06 DIAGNOSIS — Z992 Dependence on renal dialysis: Secondary | ICD-10-CM | POA: Diagnosis not present

## 2020-01-07 ENCOUNTER — Ambulatory Visit: Payer: Medicare Other

## 2020-01-08 DIAGNOSIS — D689 Coagulation defect, unspecified: Secondary | ICD-10-CM | POA: Diagnosis not present

## 2020-01-08 DIAGNOSIS — N2581 Secondary hyperparathyroidism of renal origin: Secondary | ICD-10-CM | POA: Diagnosis not present

## 2020-01-08 DIAGNOSIS — N186 End stage renal disease: Secondary | ICD-10-CM | POA: Diagnosis not present

## 2020-01-08 DIAGNOSIS — D509 Iron deficiency anemia, unspecified: Secondary | ICD-10-CM | POA: Diagnosis not present

## 2020-01-08 DIAGNOSIS — E1129 Type 2 diabetes mellitus with other diabetic kidney complication: Secondary | ICD-10-CM | POA: Diagnosis not present

## 2020-01-08 DIAGNOSIS — E039 Hypothyroidism, unspecified: Secondary | ICD-10-CM | POA: Diagnosis not present

## 2020-01-08 DIAGNOSIS — Z992 Dependence on renal dialysis: Secondary | ICD-10-CM | POA: Diagnosis not present

## 2020-01-08 DIAGNOSIS — K739 Chronic hepatitis, unspecified: Secondary | ICD-10-CM | POA: Diagnosis not present

## 2020-01-11 DIAGNOSIS — E1129 Type 2 diabetes mellitus with other diabetic kidney complication: Secondary | ICD-10-CM | POA: Diagnosis not present

## 2020-01-11 DIAGNOSIS — N2581 Secondary hyperparathyroidism of renal origin: Secondary | ICD-10-CM | POA: Diagnosis not present

## 2020-01-11 DIAGNOSIS — N186 End stage renal disease: Secondary | ICD-10-CM | POA: Diagnosis not present

## 2020-01-11 DIAGNOSIS — K739 Chronic hepatitis, unspecified: Secondary | ICD-10-CM | POA: Diagnosis not present

## 2020-01-11 DIAGNOSIS — D689 Coagulation defect, unspecified: Secondary | ICD-10-CM | POA: Diagnosis not present

## 2020-01-11 DIAGNOSIS — E039 Hypothyroidism, unspecified: Secondary | ICD-10-CM | POA: Diagnosis not present

## 2020-01-11 DIAGNOSIS — D509 Iron deficiency anemia, unspecified: Secondary | ICD-10-CM | POA: Diagnosis not present

## 2020-01-11 DIAGNOSIS — Z992 Dependence on renal dialysis: Secondary | ICD-10-CM | POA: Diagnosis not present

## 2020-01-13 DIAGNOSIS — E039 Hypothyroidism, unspecified: Secondary | ICD-10-CM | POA: Diagnosis not present

## 2020-01-13 DIAGNOSIS — N186 End stage renal disease: Secondary | ICD-10-CM | POA: Diagnosis not present

## 2020-01-13 DIAGNOSIS — N2581 Secondary hyperparathyroidism of renal origin: Secondary | ICD-10-CM | POA: Diagnosis not present

## 2020-01-13 DIAGNOSIS — K739 Chronic hepatitis, unspecified: Secondary | ICD-10-CM | POA: Diagnosis not present

## 2020-01-13 DIAGNOSIS — D689 Coagulation defect, unspecified: Secondary | ICD-10-CM | POA: Diagnosis not present

## 2020-01-13 DIAGNOSIS — E1129 Type 2 diabetes mellitus with other diabetic kidney complication: Secondary | ICD-10-CM | POA: Diagnosis not present

## 2020-01-13 DIAGNOSIS — D509 Iron deficiency anemia, unspecified: Secondary | ICD-10-CM | POA: Diagnosis not present

## 2020-01-13 DIAGNOSIS — Z992 Dependence on renal dialysis: Secondary | ICD-10-CM | POA: Diagnosis not present

## 2020-01-15 DIAGNOSIS — K739 Chronic hepatitis, unspecified: Secondary | ICD-10-CM | POA: Diagnosis not present

## 2020-01-15 DIAGNOSIS — D689 Coagulation defect, unspecified: Secondary | ICD-10-CM | POA: Diagnosis not present

## 2020-01-15 DIAGNOSIS — N2581 Secondary hyperparathyroidism of renal origin: Secondary | ICD-10-CM | POA: Diagnosis not present

## 2020-01-15 DIAGNOSIS — D509 Iron deficiency anemia, unspecified: Secondary | ICD-10-CM | POA: Diagnosis not present

## 2020-01-15 DIAGNOSIS — N186 End stage renal disease: Secondary | ICD-10-CM | POA: Diagnosis not present

## 2020-01-15 DIAGNOSIS — Z992 Dependence on renal dialysis: Secondary | ICD-10-CM | POA: Diagnosis not present

## 2020-01-15 DIAGNOSIS — E1129 Type 2 diabetes mellitus with other diabetic kidney complication: Secondary | ICD-10-CM | POA: Diagnosis not present

## 2020-01-15 DIAGNOSIS — E039 Hypothyroidism, unspecified: Secondary | ICD-10-CM | POA: Diagnosis not present

## 2020-01-18 DIAGNOSIS — N186 End stage renal disease: Secondary | ICD-10-CM | POA: Diagnosis not present

## 2020-01-18 DIAGNOSIS — K739 Chronic hepatitis, unspecified: Secondary | ICD-10-CM | POA: Diagnosis not present

## 2020-01-18 DIAGNOSIS — D509 Iron deficiency anemia, unspecified: Secondary | ICD-10-CM | POA: Diagnosis not present

## 2020-01-18 DIAGNOSIS — N2581 Secondary hyperparathyroidism of renal origin: Secondary | ICD-10-CM | POA: Diagnosis not present

## 2020-01-18 DIAGNOSIS — Z992 Dependence on renal dialysis: Secondary | ICD-10-CM | POA: Diagnosis not present

## 2020-01-18 DIAGNOSIS — E039 Hypothyroidism, unspecified: Secondary | ICD-10-CM | POA: Diagnosis not present

## 2020-01-18 DIAGNOSIS — E1129 Type 2 diabetes mellitus with other diabetic kidney complication: Secondary | ICD-10-CM | POA: Diagnosis not present

## 2020-01-18 DIAGNOSIS — D689 Coagulation defect, unspecified: Secondary | ICD-10-CM | POA: Diagnosis not present

## 2020-01-19 DIAGNOSIS — D509 Iron deficiency anemia, unspecified: Secondary | ICD-10-CM | POA: Diagnosis not present

## 2020-01-19 DIAGNOSIS — D689 Coagulation defect, unspecified: Secondary | ICD-10-CM | POA: Diagnosis not present

## 2020-01-19 DIAGNOSIS — N2581 Secondary hyperparathyroidism of renal origin: Secondary | ICD-10-CM | POA: Diagnosis not present

## 2020-01-19 DIAGNOSIS — R7989 Other specified abnormal findings of blood chemistry: Secondary | ICD-10-CM | POA: Diagnosis not present

## 2020-01-19 DIAGNOSIS — E039 Hypothyroidism, unspecified: Secondary | ICD-10-CM | POA: Diagnosis not present

## 2020-01-19 DIAGNOSIS — Z5181 Encounter for therapeutic drug level monitoring: Secondary | ICD-10-CM | POA: Diagnosis not present

## 2020-01-19 DIAGNOSIS — R790 Abnormal level of blood mineral: Secondary | ICD-10-CM | POA: Diagnosis not present

## 2020-01-19 DIAGNOSIS — N186 End stage renal disease: Secondary | ICD-10-CM | POA: Diagnosis not present

## 2020-01-19 DIAGNOSIS — E1129 Type 2 diabetes mellitus with other diabetic kidney complication: Secondary | ICD-10-CM | POA: Diagnosis not present

## 2020-01-19 DIAGNOSIS — Z992 Dependence on renal dialysis: Secondary | ICD-10-CM | POA: Diagnosis not present

## 2020-01-19 DIAGNOSIS — K739 Chronic hepatitis, unspecified: Secondary | ICD-10-CM | POA: Diagnosis not present

## 2020-01-20 DIAGNOSIS — D689 Coagulation defect, unspecified: Secondary | ICD-10-CM | POA: Diagnosis not present

## 2020-01-20 DIAGNOSIS — E039 Hypothyroidism, unspecified: Secondary | ICD-10-CM | POA: Diagnosis not present

## 2020-01-20 DIAGNOSIS — K739 Chronic hepatitis, unspecified: Secondary | ICD-10-CM | POA: Diagnosis not present

## 2020-01-20 DIAGNOSIS — E1129 Type 2 diabetes mellitus with other diabetic kidney complication: Secondary | ICD-10-CM | POA: Diagnosis not present

## 2020-01-20 DIAGNOSIS — N186 End stage renal disease: Secondary | ICD-10-CM | POA: Diagnosis not present

## 2020-01-20 DIAGNOSIS — D509 Iron deficiency anemia, unspecified: Secondary | ICD-10-CM | POA: Diagnosis not present

## 2020-01-20 DIAGNOSIS — N2581 Secondary hyperparathyroidism of renal origin: Secondary | ICD-10-CM | POA: Diagnosis not present

## 2020-01-20 DIAGNOSIS — Z992 Dependence on renal dialysis: Secondary | ICD-10-CM | POA: Diagnosis not present

## 2020-01-21 DIAGNOSIS — T8612 Kidney transplant failure: Secondary | ICD-10-CM | POA: Diagnosis not present

## 2020-01-21 DIAGNOSIS — N186 End stage renal disease: Secondary | ICD-10-CM | POA: Diagnosis not present

## 2020-01-21 DIAGNOSIS — Z992 Dependence on renal dialysis: Secondary | ICD-10-CM | POA: Diagnosis not present

## 2020-01-22 DIAGNOSIS — N186 End stage renal disease: Secondary | ICD-10-CM | POA: Diagnosis not present

## 2020-01-22 DIAGNOSIS — N2581 Secondary hyperparathyroidism of renal origin: Secondary | ICD-10-CM | POA: Diagnosis not present

## 2020-01-22 DIAGNOSIS — D689 Coagulation defect, unspecified: Secondary | ICD-10-CM | POA: Diagnosis not present

## 2020-01-22 DIAGNOSIS — E039 Hypothyroidism, unspecified: Secondary | ICD-10-CM | POA: Diagnosis not present

## 2020-01-22 DIAGNOSIS — Z111 Encounter for screening for respiratory tuberculosis: Secondary | ICD-10-CM | POA: Diagnosis not present

## 2020-01-22 DIAGNOSIS — D509 Iron deficiency anemia, unspecified: Secondary | ICD-10-CM | POA: Diagnosis not present

## 2020-01-22 DIAGNOSIS — Z992 Dependence on renal dialysis: Secondary | ICD-10-CM | POA: Diagnosis not present

## 2020-01-22 DIAGNOSIS — K739 Chronic hepatitis, unspecified: Secondary | ICD-10-CM | POA: Diagnosis not present

## 2020-01-25 DIAGNOSIS — D689 Coagulation defect, unspecified: Secondary | ICD-10-CM | POA: Diagnosis not present

## 2020-01-25 DIAGNOSIS — Z111 Encounter for screening for respiratory tuberculosis: Secondary | ICD-10-CM | POA: Diagnosis not present

## 2020-01-25 DIAGNOSIS — E039 Hypothyroidism, unspecified: Secondary | ICD-10-CM | POA: Diagnosis not present

## 2020-01-25 DIAGNOSIS — K739 Chronic hepatitis, unspecified: Secondary | ICD-10-CM | POA: Diagnosis not present

## 2020-01-25 DIAGNOSIS — D509 Iron deficiency anemia, unspecified: Secondary | ICD-10-CM | POA: Diagnosis not present

## 2020-01-25 DIAGNOSIS — N2581 Secondary hyperparathyroidism of renal origin: Secondary | ICD-10-CM | POA: Diagnosis not present

## 2020-01-25 DIAGNOSIS — N186 End stage renal disease: Secondary | ICD-10-CM | POA: Diagnosis not present

## 2020-01-25 DIAGNOSIS — Z992 Dependence on renal dialysis: Secondary | ICD-10-CM | POA: Diagnosis not present

## 2020-01-27 DIAGNOSIS — K739 Chronic hepatitis, unspecified: Secondary | ICD-10-CM | POA: Diagnosis not present

## 2020-01-27 DIAGNOSIS — Z111 Encounter for screening for respiratory tuberculosis: Secondary | ICD-10-CM | POA: Diagnosis not present

## 2020-01-27 DIAGNOSIS — N2581 Secondary hyperparathyroidism of renal origin: Secondary | ICD-10-CM | POA: Diagnosis not present

## 2020-01-27 DIAGNOSIS — D689 Coagulation defect, unspecified: Secondary | ICD-10-CM | POA: Diagnosis not present

## 2020-01-27 DIAGNOSIS — N186 End stage renal disease: Secondary | ICD-10-CM | POA: Diagnosis not present

## 2020-01-27 DIAGNOSIS — Z992 Dependence on renal dialysis: Secondary | ICD-10-CM | POA: Diagnosis not present

## 2020-01-27 DIAGNOSIS — M810 Age-related osteoporosis without current pathological fracture: Secondary | ICD-10-CM | POA: Diagnosis not present

## 2020-01-27 DIAGNOSIS — J45909 Unspecified asthma, uncomplicated: Secondary | ICD-10-CM | POA: Diagnosis not present

## 2020-01-27 DIAGNOSIS — I10 Essential (primary) hypertension: Secondary | ICD-10-CM | POA: Diagnosis not present

## 2020-01-27 DIAGNOSIS — D509 Iron deficiency anemia, unspecified: Secondary | ICD-10-CM | POA: Diagnosis not present

## 2020-01-27 DIAGNOSIS — E039 Hypothyroidism, unspecified: Secondary | ICD-10-CM | POA: Diagnosis not present

## 2020-01-28 DIAGNOSIS — R05 Cough: Secondary | ICD-10-CM | POA: Diagnosis not present

## 2020-01-28 DIAGNOSIS — J45909 Unspecified asthma, uncomplicated: Secondary | ICD-10-CM | POA: Diagnosis not present

## 2020-01-28 DIAGNOSIS — D3A090 Benign carcinoid tumor of the bronchus and lung: Secondary | ICD-10-CM | POA: Diagnosis not present

## 2020-01-28 DIAGNOSIS — R0602 Shortness of breath: Secondary | ICD-10-CM | POA: Diagnosis not present

## 2020-01-29 DIAGNOSIS — Z992 Dependence on renal dialysis: Secondary | ICD-10-CM | POA: Diagnosis not present

## 2020-01-29 DIAGNOSIS — D509 Iron deficiency anemia, unspecified: Secondary | ICD-10-CM | POA: Diagnosis not present

## 2020-01-29 DIAGNOSIS — N186 End stage renal disease: Secondary | ICD-10-CM | POA: Diagnosis not present

## 2020-01-29 DIAGNOSIS — E039 Hypothyroidism, unspecified: Secondary | ICD-10-CM | POA: Diagnosis not present

## 2020-01-29 DIAGNOSIS — N2581 Secondary hyperparathyroidism of renal origin: Secondary | ICD-10-CM | POA: Diagnosis not present

## 2020-01-29 DIAGNOSIS — D689 Coagulation defect, unspecified: Secondary | ICD-10-CM | POA: Diagnosis not present

## 2020-01-29 DIAGNOSIS — Z111 Encounter for screening for respiratory tuberculosis: Secondary | ICD-10-CM | POA: Diagnosis not present

## 2020-01-29 DIAGNOSIS — K739 Chronic hepatitis, unspecified: Secondary | ICD-10-CM | POA: Diagnosis not present

## 2020-02-01 DIAGNOSIS — N2581 Secondary hyperparathyroidism of renal origin: Secondary | ICD-10-CM | POA: Diagnosis not present

## 2020-02-01 DIAGNOSIS — D509 Iron deficiency anemia, unspecified: Secondary | ICD-10-CM | POA: Diagnosis not present

## 2020-02-01 DIAGNOSIS — N186 End stage renal disease: Secondary | ICD-10-CM | POA: Diagnosis not present

## 2020-02-01 DIAGNOSIS — Z111 Encounter for screening for respiratory tuberculosis: Secondary | ICD-10-CM | POA: Diagnosis not present

## 2020-02-01 DIAGNOSIS — Z992 Dependence on renal dialysis: Secondary | ICD-10-CM | POA: Diagnosis not present

## 2020-02-01 DIAGNOSIS — D689 Coagulation defect, unspecified: Secondary | ICD-10-CM | POA: Diagnosis not present

## 2020-02-01 DIAGNOSIS — K739 Chronic hepatitis, unspecified: Secondary | ICD-10-CM | POA: Diagnosis not present

## 2020-02-01 DIAGNOSIS — E039 Hypothyroidism, unspecified: Secondary | ICD-10-CM | POA: Diagnosis not present

## 2020-02-03 DIAGNOSIS — E039 Hypothyroidism, unspecified: Secondary | ICD-10-CM | POA: Diagnosis not present

## 2020-02-03 DIAGNOSIS — N2581 Secondary hyperparathyroidism of renal origin: Secondary | ICD-10-CM | POA: Diagnosis not present

## 2020-02-03 DIAGNOSIS — Z992 Dependence on renal dialysis: Secondary | ICD-10-CM | POA: Diagnosis not present

## 2020-02-03 DIAGNOSIS — D509 Iron deficiency anemia, unspecified: Secondary | ICD-10-CM | POA: Diagnosis not present

## 2020-02-03 DIAGNOSIS — K739 Chronic hepatitis, unspecified: Secondary | ICD-10-CM | POA: Diagnosis not present

## 2020-02-03 DIAGNOSIS — N186 End stage renal disease: Secondary | ICD-10-CM | POA: Diagnosis not present

## 2020-02-03 DIAGNOSIS — Z111 Encounter for screening for respiratory tuberculosis: Secondary | ICD-10-CM | POA: Diagnosis not present

## 2020-02-03 DIAGNOSIS — D689 Coagulation defect, unspecified: Secondary | ICD-10-CM | POA: Diagnosis not present

## 2020-02-05 DIAGNOSIS — Z992 Dependence on renal dialysis: Secondary | ICD-10-CM | POA: Diagnosis not present

## 2020-02-05 DIAGNOSIS — E039 Hypothyroidism, unspecified: Secondary | ICD-10-CM | POA: Diagnosis not present

## 2020-02-05 DIAGNOSIS — D509 Iron deficiency anemia, unspecified: Secondary | ICD-10-CM | POA: Diagnosis not present

## 2020-02-05 DIAGNOSIS — D689 Coagulation defect, unspecified: Secondary | ICD-10-CM | POA: Diagnosis not present

## 2020-02-05 DIAGNOSIS — Z111 Encounter for screening for respiratory tuberculosis: Secondary | ICD-10-CM | POA: Diagnosis not present

## 2020-02-05 DIAGNOSIS — N2581 Secondary hyperparathyroidism of renal origin: Secondary | ICD-10-CM | POA: Diagnosis not present

## 2020-02-05 DIAGNOSIS — K739 Chronic hepatitis, unspecified: Secondary | ICD-10-CM | POA: Diagnosis not present

## 2020-02-05 DIAGNOSIS — N186 End stage renal disease: Secondary | ICD-10-CM | POA: Diagnosis not present

## 2020-02-08 DIAGNOSIS — E039 Hypothyroidism, unspecified: Secondary | ICD-10-CM | POA: Diagnosis not present

## 2020-02-08 DIAGNOSIS — K739 Chronic hepatitis, unspecified: Secondary | ICD-10-CM | POA: Diagnosis not present

## 2020-02-08 DIAGNOSIS — Z992 Dependence on renal dialysis: Secondary | ICD-10-CM | POA: Diagnosis not present

## 2020-02-08 DIAGNOSIS — Z111 Encounter for screening for respiratory tuberculosis: Secondary | ICD-10-CM | POA: Diagnosis not present

## 2020-02-08 DIAGNOSIS — D689 Coagulation defect, unspecified: Secondary | ICD-10-CM | POA: Diagnosis not present

## 2020-02-08 DIAGNOSIS — N186 End stage renal disease: Secondary | ICD-10-CM | POA: Diagnosis not present

## 2020-02-08 DIAGNOSIS — N2581 Secondary hyperparathyroidism of renal origin: Secondary | ICD-10-CM | POA: Diagnosis not present

## 2020-02-08 DIAGNOSIS — D509 Iron deficiency anemia, unspecified: Secondary | ICD-10-CM | POA: Diagnosis not present

## 2020-02-10 DIAGNOSIS — E039 Hypothyroidism, unspecified: Secondary | ICD-10-CM | POA: Diagnosis not present

## 2020-02-10 DIAGNOSIS — K739 Chronic hepatitis, unspecified: Secondary | ICD-10-CM | POA: Diagnosis not present

## 2020-02-10 DIAGNOSIS — Z111 Encounter for screening for respiratory tuberculosis: Secondary | ICD-10-CM | POA: Diagnosis not present

## 2020-02-10 DIAGNOSIS — D689 Coagulation defect, unspecified: Secondary | ICD-10-CM | POA: Diagnosis not present

## 2020-02-10 DIAGNOSIS — N186 End stage renal disease: Secondary | ICD-10-CM | POA: Diagnosis not present

## 2020-02-10 DIAGNOSIS — N2581 Secondary hyperparathyroidism of renal origin: Secondary | ICD-10-CM | POA: Diagnosis not present

## 2020-02-10 DIAGNOSIS — Z992 Dependence on renal dialysis: Secondary | ICD-10-CM | POA: Diagnosis not present

## 2020-02-10 DIAGNOSIS — D509 Iron deficiency anemia, unspecified: Secondary | ICD-10-CM | POA: Diagnosis not present

## 2020-02-12 DIAGNOSIS — K739 Chronic hepatitis, unspecified: Secondary | ICD-10-CM | POA: Diagnosis not present

## 2020-02-12 DIAGNOSIS — N2581 Secondary hyperparathyroidism of renal origin: Secondary | ICD-10-CM | POA: Diagnosis not present

## 2020-02-12 DIAGNOSIS — Z111 Encounter for screening for respiratory tuberculosis: Secondary | ICD-10-CM | POA: Diagnosis not present

## 2020-02-12 DIAGNOSIS — N186 End stage renal disease: Secondary | ICD-10-CM | POA: Diagnosis not present

## 2020-02-12 DIAGNOSIS — D689 Coagulation defect, unspecified: Secondary | ICD-10-CM | POA: Diagnosis not present

## 2020-02-12 DIAGNOSIS — D509 Iron deficiency anemia, unspecified: Secondary | ICD-10-CM | POA: Diagnosis not present

## 2020-02-12 DIAGNOSIS — E039 Hypothyroidism, unspecified: Secondary | ICD-10-CM | POA: Diagnosis not present

## 2020-02-12 DIAGNOSIS — Z992 Dependence on renal dialysis: Secondary | ICD-10-CM | POA: Diagnosis not present

## 2020-02-13 DIAGNOSIS — Z111 Encounter for screening for respiratory tuberculosis: Secondary | ICD-10-CM | POA: Diagnosis not present

## 2020-02-13 DIAGNOSIS — D509 Iron deficiency anemia, unspecified: Secondary | ICD-10-CM | POA: Diagnosis not present

## 2020-02-13 DIAGNOSIS — E039 Hypothyroidism, unspecified: Secondary | ICD-10-CM | POA: Diagnosis not present

## 2020-02-13 DIAGNOSIS — N2581 Secondary hyperparathyroidism of renal origin: Secondary | ICD-10-CM | POA: Diagnosis not present

## 2020-02-13 DIAGNOSIS — N186 End stage renal disease: Secondary | ICD-10-CM | POA: Diagnosis not present

## 2020-02-13 DIAGNOSIS — K739 Chronic hepatitis, unspecified: Secondary | ICD-10-CM | POA: Diagnosis not present

## 2020-02-13 DIAGNOSIS — D689 Coagulation defect, unspecified: Secondary | ICD-10-CM | POA: Diagnosis not present

## 2020-02-13 DIAGNOSIS — Z992 Dependence on renal dialysis: Secondary | ICD-10-CM | POA: Diagnosis not present

## 2020-02-15 DIAGNOSIS — K739 Chronic hepatitis, unspecified: Secondary | ICD-10-CM | POA: Diagnosis not present

## 2020-02-15 DIAGNOSIS — Z992 Dependence on renal dialysis: Secondary | ICD-10-CM | POA: Diagnosis not present

## 2020-02-15 DIAGNOSIS — N2581 Secondary hyperparathyroidism of renal origin: Secondary | ICD-10-CM | POA: Diagnosis not present

## 2020-02-15 DIAGNOSIS — D509 Iron deficiency anemia, unspecified: Secondary | ICD-10-CM | POA: Diagnosis not present

## 2020-02-15 DIAGNOSIS — Z111 Encounter for screening for respiratory tuberculosis: Secondary | ICD-10-CM | POA: Diagnosis not present

## 2020-02-15 DIAGNOSIS — E039 Hypothyroidism, unspecified: Secondary | ICD-10-CM | POA: Diagnosis not present

## 2020-02-15 DIAGNOSIS — D689 Coagulation defect, unspecified: Secondary | ICD-10-CM | POA: Diagnosis not present

## 2020-02-15 DIAGNOSIS — N186 End stage renal disease: Secondary | ICD-10-CM | POA: Diagnosis not present

## 2020-02-17 DIAGNOSIS — K739 Chronic hepatitis, unspecified: Secondary | ICD-10-CM | POA: Diagnosis not present

## 2020-02-17 DIAGNOSIS — D689 Coagulation defect, unspecified: Secondary | ICD-10-CM | POA: Diagnosis not present

## 2020-02-17 DIAGNOSIS — N2581 Secondary hyperparathyroidism of renal origin: Secondary | ICD-10-CM | POA: Diagnosis not present

## 2020-02-17 DIAGNOSIS — N186 End stage renal disease: Secondary | ICD-10-CM | POA: Diagnosis not present

## 2020-02-17 DIAGNOSIS — E039 Hypothyroidism, unspecified: Secondary | ICD-10-CM | POA: Diagnosis not present

## 2020-02-17 DIAGNOSIS — Z992 Dependence on renal dialysis: Secondary | ICD-10-CM | POA: Diagnosis not present

## 2020-02-17 DIAGNOSIS — Z111 Encounter for screening for respiratory tuberculosis: Secondary | ICD-10-CM | POA: Diagnosis not present

## 2020-02-17 DIAGNOSIS — D509 Iron deficiency anemia, unspecified: Secondary | ICD-10-CM | POA: Diagnosis not present

## 2020-02-18 DIAGNOSIS — J387 Other diseases of larynx: Secondary | ICD-10-CM | POA: Diagnosis not present

## 2020-02-18 DIAGNOSIS — R0982 Postnasal drip: Secondary | ICD-10-CM | POA: Diagnosis not present

## 2020-02-18 DIAGNOSIS — J383 Other diseases of vocal cords: Secondary | ICD-10-CM | POA: Diagnosis not present

## 2020-02-19 DIAGNOSIS — N186 End stage renal disease: Secondary | ICD-10-CM | POA: Diagnosis not present

## 2020-02-19 DIAGNOSIS — Z111 Encounter for screening for respiratory tuberculosis: Secondary | ICD-10-CM | POA: Diagnosis not present

## 2020-02-19 DIAGNOSIS — Z992 Dependence on renal dialysis: Secondary | ICD-10-CM | POA: Diagnosis not present

## 2020-02-19 DIAGNOSIS — N2581 Secondary hyperparathyroidism of renal origin: Secondary | ICD-10-CM | POA: Diagnosis not present

## 2020-02-19 DIAGNOSIS — K739 Chronic hepatitis, unspecified: Secondary | ICD-10-CM | POA: Diagnosis not present

## 2020-02-19 DIAGNOSIS — D689 Coagulation defect, unspecified: Secondary | ICD-10-CM | POA: Diagnosis not present

## 2020-02-19 DIAGNOSIS — D509 Iron deficiency anemia, unspecified: Secondary | ICD-10-CM | POA: Diagnosis not present

## 2020-02-19 DIAGNOSIS — E039 Hypothyroidism, unspecified: Secondary | ICD-10-CM | POA: Diagnosis not present

## 2020-02-21 DIAGNOSIS — Z992 Dependence on renal dialysis: Secondary | ICD-10-CM | POA: Diagnosis not present

## 2020-02-21 DIAGNOSIS — T8612 Kidney transplant failure: Secondary | ICD-10-CM | POA: Diagnosis not present

## 2020-02-21 DIAGNOSIS — N186 End stage renal disease: Secondary | ICD-10-CM | POA: Diagnosis not present

## 2020-02-22 DIAGNOSIS — E039 Hypothyroidism, unspecified: Secondary | ICD-10-CM | POA: Diagnosis not present

## 2020-02-22 DIAGNOSIS — K739 Chronic hepatitis, unspecified: Secondary | ICD-10-CM | POA: Diagnosis not present

## 2020-02-22 DIAGNOSIS — D689 Coagulation defect, unspecified: Secondary | ICD-10-CM | POA: Diagnosis not present

## 2020-02-22 DIAGNOSIS — D509 Iron deficiency anemia, unspecified: Secondary | ICD-10-CM | POA: Diagnosis not present

## 2020-02-22 DIAGNOSIS — N186 End stage renal disease: Secondary | ICD-10-CM | POA: Diagnosis not present

## 2020-02-22 DIAGNOSIS — Z992 Dependence on renal dialysis: Secondary | ICD-10-CM | POA: Diagnosis not present

## 2020-02-22 DIAGNOSIS — N2581 Secondary hyperparathyroidism of renal origin: Secondary | ICD-10-CM | POA: Diagnosis not present

## 2020-02-24 DIAGNOSIS — Z992 Dependence on renal dialysis: Secondary | ICD-10-CM | POA: Diagnosis not present

## 2020-02-24 DIAGNOSIS — D509 Iron deficiency anemia, unspecified: Secondary | ICD-10-CM | POA: Diagnosis not present

## 2020-02-24 DIAGNOSIS — K739 Chronic hepatitis, unspecified: Secondary | ICD-10-CM | POA: Diagnosis not present

## 2020-02-24 DIAGNOSIS — N2581 Secondary hyperparathyroidism of renal origin: Secondary | ICD-10-CM | POA: Diagnosis not present

## 2020-02-24 DIAGNOSIS — E039 Hypothyroidism, unspecified: Secondary | ICD-10-CM | POA: Diagnosis not present

## 2020-02-24 DIAGNOSIS — D689 Coagulation defect, unspecified: Secondary | ICD-10-CM | POA: Diagnosis not present

## 2020-02-24 DIAGNOSIS — N186 End stage renal disease: Secondary | ICD-10-CM | POA: Diagnosis not present

## 2020-02-26 DIAGNOSIS — N2581 Secondary hyperparathyroidism of renal origin: Secondary | ICD-10-CM | POA: Diagnosis not present

## 2020-02-26 DIAGNOSIS — E039 Hypothyroidism, unspecified: Secondary | ICD-10-CM | POA: Diagnosis not present

## 2020-02-26 DIAGNOSIS — D689 Coagulation defect, unspecified: Secondary | ICD-10-CM | POA: Diagnosis not present

## 2020-02-26 DIAGNOSIS — N186 End stage renal disease: Secondary | ICD-10-CM | POA: Diagnosis not present

## 2020-02-26 DIAGNOSIS — K739 Chronic hepatitis, unspecified: Secondary | ICD-10-CM | POA: Diagnosis not present

## 2020-02-26 DIAGNOSIS — D509 Iron deficiency anemia, unspecified: Secondary | ICD-10-CM | POA: Diagnosis not present

## 2020-02-26 DIAGNOSIS — Z992 Dependence on renal dialysis: Secondary | ICD-10-CM | POA: Diagnosis not present

## 2020-02-29 DIAGNOSIS — Z992 Dependence on renal dialysis: Secondary | ICD-10-CM | POA: Diagnosis not present

## 2020-02-29 DIAGNOSIS — N2581 Secondary hyperparathyroidism of renal origin: Secondary | ICD-10-CM | POA: Diagnosis not present

## 2020-02-29 DIAGNOSIS — D689 Coagulation defect, unspecified: Secondary | ICD-10-CM | POA: Diagnosis not present

## 2020-02-29 DIAGNOSIS — N186 End stage renal disease: Secondary | ICD-10-CM | POA: Diagnosis not present

## 2020-03-02 DIAGNOSIS — D689 Coagulation defect, unspecified: Secondary | ICD-10-CM | POA: Diagnosis not present

## 2020-03-02 DIAGNOSIS — Z992 Dependence on renal dialysis: Secondary | ICD-10-CM | POA: Diagnosis not present

## 2020-03-02 DIAGNOSIS — N186 End stage renal disease: Secondary | ICD-10-CM | POA: Diagnosis not present

## 2020-03-02 DIAGNOSIS — N2581 Secondary hyperparathyroidism of renal origin: Secondary | ICD-10-CM | POA: Diagnosis not present

## 2020-03-06 DIAGNOSIS — D509 Iron deficiency anemia, unspecified: Secondary | ICD-10-CM | POA: Diagnosis not present

## 2020-03-06 DIAGNOSIS — Z992 Dependence on renal dialysis: Secondary | ICD-10-CM | POA: Diagnosis not present

## 2020-03-06 DIAGNOSIS — D689 Coagulation defect, unspecified: Secondary | ICD-10-CM | POA: Diagnosis not present

## 2020-03-06 DIAGNOSIS — K739 Chronic hepatitis, unspecified: Secondary | ICD-10-CM | POA: Diagnosis not present

## 2020-03-06 DIAGNOSIS — N2581 Secondary hyperparathyroidism of renal origin: Secondary | ICD-10-CM | POA: Diagnosis not present

## 2020-03-06 DIAGNOSIS — E039 Hypothyroidism, unspecified: Secondary | ICD-10-CM | POA: Diagnosis not present

## 2020-03-06 DIAGNOSIS — N186 End stage renal disease: Secondary | ICD-10-CM | POA: Diagnosis not present

## 2020-03-09 DIAGNOSIS — N2581 Secondary hyperparathyroidism of renal origin: Secondary | ICD-10-CM | POA: Diagnosis not present

## 2020-03-09 DIAGNOSIS — N186 End stage renal disease: Secondary | ICD-10-CM | POA: Diagnosis not present

## 2020-03-09 DIAGNOSIS — D689 Coagulation defect, unspecified: Secondary | ICD-10-CM | POA: Diagnosis not present

## 2020-03-09 DIAGNOSIS — K739 Chronic hepatitis, unspecified: Secondary | ICD-10-CM | POA: Diagnosis not present

## 2020-03-09 DIAGNOSIS — D509 Iron deficiency anemia, unspecified: Secondary | ICD-10-CM | POA: Diagnosis not present

## 2020-03-09 DIAGNOSIS — Z992 Dependence on renal dialysis: Secondary | ICD-10-CM | POA: Diagnosis not present

## 2020-03-09 DIAGNOSIS — E039 Hypothyroidism, unspecified: Secondary | ICD-10-CM | POA: Diagnosis not present

## 2020-03-11 DIAGNOSIS — D689 Coagulation defect, unspecified: Secondary | ICD-10-CM | POA: Diagnosis not present

## 2020-03-11 DIAGNOSIS — E039 Hypothyroidism, unspecified: Secondary | ICD-10-CM | POA: Diagnosis not present

## 2020-03-11 DIAGNOSIS — Z992 Dependence on renal dialysis: Secondary | ICD-10-CM | POA: Diagnosis not present

## 2020-03-11 DIAGNOSIS — N186 End stage renal disease: Secondary | ICD-10-CM | POA: Diagnosis not present

## 2020-03-11 DIAGNOSIS — D509 Iron deficiency anemia, unspecified: Secondary | ICD-10-CM | POA: Diagnosis not present

## 2020-03-11 DIAGNOSIS — K739 Chronic hepatitis, unspecified: Secondary | ICD-10-CM | POA: Diagnosis not present

## 2020-03-11 DIAGNOSIS — N2581 Secondary hyperparathyroidism of renal origin: Secondary | ICD-10-CM | POA: Diagnosis not present

## 2020-03-13 DIAGNOSIS — D689 Coagulation defect, unspecified: Secondary | ICD-10-CM | POA: Diagnosis not present

## 2020-03-13 DIAGNOSIS — K739 Chronic hepatitis, unspecified: Secondary | ICD-10-CM | POA: Diagnosis not present

## 2020-03-13 DIAGNOSIS — Z992 Dependence on renal dialysis: Secondary | ICD-10-CM | POA: Diagnosis not present

## 2020-03-13 DIAGNOSIS — D509 Iron deficiency anemia, unspecified: Secondary | ICD-10-CM | POA: Diagnosis not present

## 2020-03-13 DIAGNOSIS — N2581 Secondary hyperparathyroidism of renal origin: Secondary | ICD-10-CM | POA: Diagnosis not present

## 2020-03-13 DIAGNOSIS — N186 End stage renal disease: Secondary | ICD-10-CM | POA: Diagnosis not present

## 2020-03-13 DIAGNOSIS — E039 Hypothyroidism, unspecified: Secondary | ICD-10-CM | POA: Diagnosis not present

## 2020-03-14 DIAGNOSIS — Z992 Dependence on renal dialysis: Secondary | ICD-10-CM | POA: Diagnosis not present

## 2020-03-14 DIAGNOSIS — D509 Iron deficiency anemia, unspecified: Secondary | ICD-10-CM | POA: Diagnosis not present

## 2020-03-14 DIAGNOSIS — N2581 Secondary hyperparathyroidism of renal origin: Secondary | ICD-10-CM | POA: Diagnosis not present

## 2020-03-14 DIAGNOSIS — K739 Chronic hepatitis, unspecified: Secondary | ICD-10-CM | POA: Diagnosis not present

## 2020-03-14 DIAGNOSIS — E039 Hypothyroidism, unspecified: Secondary | ICD-10-CM | POA: Diagnosis not present

## 2020-03-14 DIAGNOSIS — D689 Coagulation defect, unspecified: Secondary | ICD-10-CM | POA: Diagnosis not present

## 2020-03-14 DIAGNOSIS — N186 End stage renal disease: Secondary | ICD-10-CM | POA: Diagnosis not present

## 2020-03-16 DIAGNOSIS — Z992 Dependence on renal dialysis: Secondary | ICD-10-CM | POA: Diagnosis not present

## 2020-03-16 DIAGNOSIS — K739 Chronic hepatitis, unspecified: Secondary | ICD-10-CM | POA: Diagnosis not present

## 2020-03-16 DIAGNOSIS — N186 End stage renal disease: Secondary | ICD-10-CM | POA: Diagnosis not present

## 2020-03-16 DIAGNOSIS — D509 Iron deficiency anemia, unspecified: Secondary | ICD-10-CM | POA: Diagnosis not present

## 2020-03-16 DIAGNOSIS — D689 Coagulation defect, unspecified: Secondary | ICD-10-CM | POA: Diagnosis not present

## 2020-03-16 DIAGNOSIS — E039 Hypothyroidism, unspecified: Secondary | ICD-10-CM | POA: Diagnosis not present

## 2020-03-16 DIAGNOSIS — N2581 Secondary hyperparathyroidism of renal origin: Secondary | ICD-10-CM | POA: Diagnosis not present

## 2020-03-18 DIAGNOSIS — D509 Iron deficiency anemia, unspecified: Secondary | ICD-10-CM | POA: Diagnosis not present

## 2020-03-18 DIAGNOSIS — N186 End stage renal disease: Secondary | ICD-10-CM | POA: Diagnosis not present

## 2020-03-18 DIAGNOSIS — Z992 Dependence on renal dialysis: Secondary | ICD-10-CM | POA: Diagnosis not present

## 2020-03-18 DIAGNOSIS — K739 Chronic hepatitis, unspecified: Secondary | ICD-10-CM | POA: Diagnosis not present

## 2020-03-18 DIAGNOSIS — D689 Coagulation defect, unspecified: Secondary | ICD-10-CM | POA: Diagnosis not present

## 2020-03-18 DIAGNOSIS — N2581 Secondary hyperparathyroidism of renal origin: Secondary | ICD-10-CM | POA: Diagnosis not present

## 2020-03-18 DIAGNOSIS — E039 Hypothyroidism, unspecified: Secondary | ICD-10-CM | POA: Diagnosis not present

## 2020-03-21 DIAGNOSIS — Z992 Dependence on renal dialysis: Secondary | ICD-10-CM | POA: Diagnosis not present

## 2020-03-21 DIAGNOSIS — D689 Coagulation defect, unspecified: Secondary | ICD-10-CM | POA: Diagnosis not present

## 2020-03-21 DIAGNOSIS — D509 Iron deficiency anemia, unspecified: Secondary | ICD-10-CM | POA: Diagnosis not present

## 2020-03-21 DIAGNOSIS — N2581 Secondary hyperparathyroidism of renal origin: Secondary | ICD-10-CM | POA: Diagnosis not present

## 2020-03-21 DIAGNOSIS — K739 Chronic hepatitis, unspecified: Secondary | ICD-10-CM | POA: Diagnosis not present

## 2020-03-21 DIAGNOSIS — N186 End stage renal disease: Secondary | ICD-10-CM | POA: Diagnosis not present

## 2020-03-21 DIAGNOSIS — E039 Hypothyroidism, unspecified: Secondary | ICD-10-CM | POA: Diagnosis not present

## 2020-03-22 DIAGNOSIS — Z992 Dependence on renal dialysis: Secondary | ICD-10-CM | POA: Diagnosis not present

## 2020-03-22 DIAGNOSIS — N186 End stage renal disease: Secondary | ICD-10-CM | POA: Diagnosis not present

## 2020-03-22 DIAGNOSIS — T8612 Kidney transplant failure: Secondary | ICD-10-CM | POA: Diagnosis not present

## 2020-03-23 DIAGNOSIS — E1129 Type 2 diabetes mellitus with other diabetic kidney complication: Secondary | ICD-10-CM | POA: Diagnosis not present

## 2020-03-23 DIAGNOSIS — Z992 Dependence on renal dialysis: Secondary | ICD-10-CM | POA: Diagnosis not present

## 2020-03-23 DIAGNOSIS — E039 Hypothyroidism, unspecified: Secondary | ICD-10-CM | POA: Diagnosis not present

## 2020-03-23 DIAGNOSIS — N2581 Secondary hyperparathyroidism of renal origin: Secondary | ICD-10-CM | POA: Diagnosis not present

## 2020-03-23 DIAGNOSIS — N186 End stage renal disease: Secondary | ICD-10-CM | POA: Diagnosis not present

## 2020-03-23 DIAGNOSIS — D689 Coagulation defect, unspecified: Secondary | ICD-10-CM | POA: Diagnosis not present

## 2020-03-23 DIAGNOSIS — D631 Anemia in chronic kidney disease: Secondary | ICD-10-CM | POA: Diagnosis not present

## 2020-03-23 DIAGNOSIS — K739 Chronic hepatitis, unspecified: Secondary | ICD-10-CM | POA: Diagnosis not present

## 2020-03-25 DIAGNOSIS — Z992 Dependence on renal dialysis: Secondary | ICD-10-CM | POA: Diagnosis not present

## 2020-03-25 DIAGNOSIS — E1129 Type 2 diabetes mellitus with other diabetic kidney complication: Secondary | ICD-10-CM | POA: Diagnosis not present

## 2020-03-25 DIAGNOSIS — D689 Coagulation defect, unspecified: Secondary | ICD-10-CM | POA: Diagnosis not present

## 2020-03-25 DIAGNOSIS — N186 End stage renal disease: Secondary | ICD-10-CM | POA: Diagnosis not present

## 2020-03-25 DIAGNOSIS — E039 Hypothyroidism, unspecified: Secondary | ICD-10-CM | POA: Diagnosis not present

## 2020-03-25 DIAGNOSIS — N2581 Secondary hyperparathyroidism of renal origin: Secondary | ICD-10-CM | POA: Diagnosis not present

## 2020-03-25 DIAGNOSIS — K739 Chronic hepatitis, unspecified: Secondary | ICD-10-CM | POA: Diagnosis not present

## 2020-03-25 DIAGNOSIS — D631 Anemia in chronic kidney disease: Secondary | ICD-10-CM | POA: Diagnosis not present

## 2020-03-28 DIAGNOSIS — D631 Anemia in chronic kidney disease: Secondary | ICD-10-CM | POA: Diagnosis not present

## 2020-03-28 DIAGNOSIS — D689 Coagulation defect, unspecified: Secondary | ICD-10-CM | POA: Diagnosis not present

## 2020-03-28 DIAGNOSIS — E039 Hypothyroidism, unspecified: Secondary | ICD-10-CM | POA: Diagnosis not present

## 2020-03-28 DIAGNOSIS — E1129 Type 2 diabetes mellitus with other diabetic kidney complication: Secondary | ICD-10-CM | POA: Diagnosis not present

## 2020-03-28 DIAGNOSIS — N186 End stage renal disease: Secondary | ICD-10-CM | POA: Diagnosis not present

## 2020-03-28 DIAGNOSIS — K739 Chronic hepatitis, unspecified: Secondary | ICD-10-CM | POA: Diagnosis not present

## 2020-03-28 DIAGNOSIS — N2581 Secondary hyperparathyroidism of renal origin: Secondary | ICD-10-CM | POA: Diagnosis not present

## 2020-03-28 DIAGNOSIS — Z992 Dependence on renal dialysis: Secondary | ICD-10-CM | POA: Diagnosis not present

## 2020-03-30 DIAGNOSIS — N2581 Secondary hyperparathyroidism of renal origin: Secondary | ICD-10-CM | POA: Diagnosis not present

## 2020-03-30 DIAGNOSIS — D689 Coagulation defect, unspecified: Secondary | ICD-10-CM | POA: Diagnosis not present

## 2020-03-30 DIAGNOSIS — Z992 Dependence on renal dialysis: Secondary | ICD-10-CM | POA: Diagnosis not present

## 2020-03-30 DIAGNOSIS — D631 Anemia in chronic kidney disease: Secondary | ICD-10-CM | POA: Diagnosis not present

## 2020-03-30 DIAGNOSIS — N186 End stage renal disease: Secondary | ICD-10-CM | POA: Diagnosis not present

## 2020-03-30 DIAGNOSIS — K739 Chronic hepatitis, unspecified: Secondary | ICD-10-CM | POA: Diagnosis not present

## 2020-03-30 DIAGNOSIS — E039 Hypothyroidism, unspecified: Secondary | ICD-10-CM | POA: Diagnosis not present

## 2020-03-30 DIAGNOSIS — E1129 Type 2 diabetes mellitus with other diabetic kidney complication: Secondary | ICD-10-CM | POA: Diagnosis not present

## 2020-03-31 DIAGNOSIS — I10 Essential (primary) hypertension: Secondary | ICD-10-CM | POA: Diagnosis not present

## 2020-03-31 DIAGNOSIS — E039 Hypothyroidism, unspecified: Secondary | ICD-10-CM | POA: Diagnosis not present

## 2020-03-31 DIAGNOSIS — N186 End stage renal disease: Secondary | ICD-10-CM | POA: Diagnosis not present

## 2020-03-31 DIAGNOSIS — J45909 Unspecified asthma, uncomplicated: Secondary | ICD-10-CM | POA: Diagnosis not present

## 2020-04-01 DIAGNOSIS — Z992 Dependence on renal dialysis: Secondary | ICD-10-CM | POA: Diagnosis not present

## 2020-04-01 DIAGNOSIS — D631 Anemia in chronic kidney disease: Secondary | ICD-10-CM | POA: Diagnosis not present

## 2020-04-01 DIAGNOSIS — E1129 Type 2 diabetes mellitus with other diabetic kidney complication: Secondary | ICD-10-CM | POA: Diagnosis not present

## 2020-04-01 DIAGNOSIS — E039 Hypothyroidism, unspecified: Secondary | ICD-10-CM | POA: Diagnosis not present

## 2020-04-01 DIAGNOSIS — N2581 Secondary hyperparathyroidism of renal origin: Secondary | ICD-10-CM | POA: Diagnosis not present

## 2020-04-01 DIAGNOSIS — N186 End stage renal disease: Secondary | ICD-10-CM | POA: Diagnosis not present

## 2020-04-01 DIAGNOSIS — D689 Coagulation defect, unspecified: Secondary | ICD-10-CM | POA: Diagnosis not present

## 2020-04-01 DIAGNOSIS — K739 Chronic hepatitis, unspecified: Secondary | ICD-10-CM | POA: Diagnosis not present

## 2020-04-04 DIAGNOSIS — E039 Hypothyroidism, unspecified: Secondary | ICD-10-CM | POA: Diagnosis not present

## 2020-04-04 DIAGNOSIS — D631 Anemia in chronic kidney disease: Secondary | ICD-10-CM | POA: Diagnosis not present

## 2020-04-04 DIAGNOSIS — Z992 Dependence on renal dialysis: Secondary | ICD-10-CM | POA: Diagnosis not present

## 2020-04-04 DIAGNOSIS — N2581 Secondary hyperparathyroidism of renal origin: Secondary | ICD-10-CM | POA: Diagnosis not present

## 2020-04-04 DIAGNOSIS — N186 End stage renal disease: Secondary | ICD-10-CM | POA: Diagnosis not present

## 2020-04-04 DIAGNOSIS — K739 Chronic hepatitis, unspecified: Secondary | ICD-10-CM | POA: Diagnosis not present

## 2020-04-04 DIAGNOSIS — D689 Coagulation defect, unspecified: Secondary | ICD-10-CM | POA: Diagnosis not present

## 2020-04-04 DIAGNOSIS — E1129 Type 2 diabetes mellitus with other diabetic kidney complication: Secondary | ICD-10-CM | POA: Diagnosis not present

## 2020-04-06 DIAGNOSIS — N2581 Secondary hyperparathyroidism of renal origin: Secondary | ICD-10-CM | POA: Diagnosis not present

## 2020-04-06 DIAGNOSIS — E1129 Type 2 diabetes mellitus with other diabetic kidney complication: Secondary | ICD-10-CM | POA: Diagnosis not present

## 2020-04-06 DIAGNOSIS — D689 Coagulation defect, unspecified: Secondary | ICD-10-CM | POA: Diagnosis not present

## 2020-04-06 DIAGNOSIS — N186 End stage renal disease: Secondary | ICD-10-CM | POA: Diagnosis not present

## 2020-04-06 DIAGNOSIS — E039 Hypothyroidism, unspecified: Secondary | ICD-10-CM | POA: Diagnosis not present

## 2020-04-06 DIAGNOSIS — D631 Anemia in chronic kidney disease: Secondary | ICD-10-CM | POA: Diagnosis not present

## 2020-04-06 DIAGNOSIS — K739 Chronic hepatitis, unspecified: Secondary | ICD-10-CM | POA: Diagnosis not present

## 2020-04-06 DIAGNOSIS — Z992 Dependence on renal dialysis: Secondary | ICD-10-CM | POA: Diagnosis not present

## 2020-04-08 DIAGNOSIS — E039 Hypothyroidism, unspecified: Secondary | ICD-10-CM | POA: Diagnosis not present

## 2020-04-08 DIAGNOSIS — E1129 Type 2 diabetes mellitus with other diabetic kidney complication: Secondary | ICD-10-CM | POA: Diagnosis not present

## 2020-04-08 DIAGNOSIS — K739 Chronic hepatitis, unspecified: Secondary | ICD-10-CM | POA: Diagnosis not present

## 2020-04-08 DIAGNOSIS — Z992 Dependence on renal dialysis: Secondary | ICD-10-CM | POA: Diagnosis not present

## 2020-04-08 DIAGNOSIS — D631 Anemia in chronic kidney disease: Secondary | ICD-10-CM | POA: Diagnosis not present

## 2020-04-08 DIAGNOSIS — D689 Coagulation defect, unspecified: Secondary | ICD-10-CM | POA: Diagnosis not present

## 2020-04-08 DIAGNOSIS — N186 End stage renal disease: Secondary | ICD-10-CM | POA: Diagnosis not present

## 2020-04-08 DIAGNOSIS — N2581 Secondary hyperparathyroidism of renal origin: Secondary | ICD-10-CM | POA: Diagnosis not present

## 2020-04-11 DIAGNOSIS — E1129 Type 2 diabetes mellitus with other diabetic kidney complication: Secondary | ICD-10-CM | POA: Diagnosis not present

## 2020-04-11 DIAGNOSIS — D689 Coagulation defect, unspecified: Secondary | ICD-10-CM | POA: Diagnosis not present

## 2020-04-11 DIAGNOSIS — Z992 Dependence on renal dialysis: Secondary | ICD-10-CM | POA: Diagnosis not present

## 2020-04-11 DIAGNOSIS — E039 Hypothyroidism, unspecified: Secondary | ICD-10-CM | POA: Diagnosis not present

## 2020-04-11 DIAGNOSIS — D631 Anemia in chronic kidney disease: Secondary | ICD-10-CM | POA: Diagnosis not present

## 2020-04-11 DIAGNOSIS — N2581 Secondary hyperparathyroidism of renal origin: Secondary | ICD-10-CM | POA: Diagnosis not present

## 2020-04-11 DIAGNOSIS — N186 End stage renal disease: Secondary | ICD-10-CM | POA: Diagnosis not present

## 2020-04-11 DIAGNOSIS — K739 Chronic hepatitis, unspecified: Secondary | ICD-10-CM | POA: Diagnosis not present

## 2020-04-13 DIAGNOSIS — Z992 Dependence on renal dialysis: Secondary | ICD-10-CM | POA: Diagnosis not present

## 2020-04-13 DIAGNOSIS — N186 End stage renal disease: Secondary | ICD-10-CM | POA: Diagnosis not present

## 2020-04-13 DIAGNOSIS — T82868A Thrombosis of vascular prosthetic devices, implants and grafts, initial encounter: Secondary | ICD-10-CM | POA: Diagnosis not present

## 2020-04-13 DIAGNOSIS — T82858A Stenosis of vascular prosthetic devices, implants and grafts, initial encounter: Secondary | ICD-10-CM | POA: Diagnosis not present

## 2020-04-15 DIAGNOSIS — K739 Chronic hepatitis, unspecified: Secondary | ICD-10-CM | POA: Diagnosis not present

## 2020-04-15 DIAGNOSIS — E1129 Type 2 diabetes mellitus with other diabetic kidney complication: Secondary | ICD-10-CM | POA: Diagnosis not present

## 2020-04-15 DIAGNOSIS — D631 Anemia in chronic kidney disease: Secondary | ICD-10-CM | POA: Diagnosis not present

## 2020-04-15 DIAGNOSIS — N186 End stage renal disease: Secondary | ICD-10-CM | POA: Diagnosis not present

## 2020-04-15 DIAGNOSIS — E039 Hypothyroidism, unspecified: Secondary | ICD-10-CM | POA: Diagnosis not present

## 2020-04-15 DIAGNOSIS — N2581 Secondary hyperparathyroidism of renal origin: Secondary | ICD-10-CM | POA: Diagnosis not present

## 2020-04-15 DIAGNOSIS — D689 Coagulation defect, unspecified: Secondary | ICD-10-CM | POA: Diagnosis not present

## 2020-04-15 DIAGNOSIS — Z992 Dependence on renal dialysis: Secondary | ICD-10-CM | POA: Diagnosis not present

## 2020-04-18 DIAGNOSIS — D631 Anemia in chronic kidney disease: Secondary | ICD-10-CM | POA: Diagnosis not present

## 2020-04-18 DIAGNOSIS — E039 Hypothyroidism, unspecified: Secondary | ICD-10-CM | POA: Diagnosis not present

## 2020-04-18 DIAGNOSIS — E1129 Type 2 diabetes mellitus with other diabetic kidney complication: Secondary | ICD-10-CM | POA: Diagnosis not present

## 2020-04-18 DIAGNOSIS — N186 End stage renal disease: Secondary | ICD-10-CM | POA: Diagnosis not present

## 2020-04-18 DIAGNOSIS — N2581 Secondary hyperparathyroidism of renal origin: Secondary | ICD-10-CM | POA: Diagnosis not present

## 2020-04-18 DIAGNOSIS — K739 Chronic hepatitis, unspecified: Secondary | ICD-10-CM | POA: Diagnosis not present

## 2020-04-18 DIAGNOSIS — D689 Coagulation defect, unspecified: Secondary | ICD-10-CM | POA: Diagnosis not present

## 2020-04-18 DIAGNOSIS — Z992 Dependence on renal dialysis: Secondary | ICD-10-CM | POA: Diagnosis not present

## 2020-04-20 DIAGNOSIS — Z992 Dependence on renal dialysis: Secondary | ICD-10-CM | POA: Diagnosis not present

## 2020-04-20 DIAGNOSIS — E1129 Type 2 diabetes mellitus with other diabetic kidney complication: Secondary | ICD-10-CM | POA: Diagnosis not present

## 2020-04-20 DIAGNOSIS — T50905A Adverse effect of unspecified drugs, medicaments and biological substances, initial encounter: Secondary | ICD-10-CM | POA: Diagnosis not present

## 2020-04-20 DIAGNOSIS — N186 End stage renal disease: Secondary | ICD-10-CM | POA: Diagnosis not present

## 2020-04-20 DIAGNOSIS — K739 Chronic hepatitis, unspecified: Secondary | ICD-10-CM | POA: Diagnosis not present

## 2020-04-20 DIAGNOSIS — E039 Hypothyroidism, unspecified: Secondary | ICD-10-CM | POA: Diagnosis not present

## 2020-04-20 DIAGNOSIS — D631 Anemia in chronic kidney disease: Secondary | ICD-10-CM | POA: Diagnosis not present

## 2020-04-20 DIAGNOSIS — N2581 Secondary hyperparathyroidism of renal origin: Secondary | ICD-10-CM | POA: Diagnosis not present

## 2020-04-20 DIAGNOSIS — D689 Coagulation defect, unspecified: Secondary | ICD-10-CM | POA: Diagnosis not present

## 2020-04-22 DIAGNOSIS — K739 Chronic hepatitis, unspecified: Secondary | ICD-10-CM | POA: Diagnosis not present

## 2020-04-22 DIAGNOSIS — T8612 Kidney transplant failure: Secondary | ICD-10-CM | POA: Diagnosis not present

## 2020-04-22 DIAGNOSIS — Z992 Dependence on renal dialysis: Secondary | ICD-10-CM | POA: Diagnosis not present

## 2020-04-22 DIAGNOSIS — E039 Hypothyroidism, unspecified: Secondary | ICD-10-CM | POA: Diagnosis not present

## 2020-04-22 DIAGNOSIS — N186 End stage renal disease: Secondary | ICD-10-CM | POA: Diagnosis not present

## 2020-04-22 DIAGNOSIS — D631 Anemia in chronic kidney disease: Secondary | ICD-10-CM | POA: Diagnosis not present

## 2020-04-22 DIAGNOSIS — D689 Coagulation defect, unspecified: Secondary | ICD-10-CM | POA: Diagnosis not present

## 2020-04-22 DIAGNOSIS — E1129 Type 2 diabetes mellitus with other diabetic kidney complication: Secondary | ICD-10-CM | POA: Diagnosis not present

## 2020-04-22 DIAGNOSIS — N2581 Secondary hyperparathyroidism of renal origin: Secondary | ICD-10-CM | POA: Diagnosis not present

## 2020-04-25 DIAGNOSIS — E039 Hypothyroidism, unspecified: Secondary | ICD-10-CM | POA: Diagnosis not present

## 2020-04-25 DIAGNOSIS — D631 Anemia in chronic kidney disease: Secondary | ICD-10-CM | POA: Diagnosis not present

## 2020-04-25 DIAGNOSIS — D689 Coagulation defect, unspecified: Secondary | ICD-10-CM | POA: Diagnosis not present

## 2020-04-25 DIAGNOSIS — N2581 Secondary hyperparathyroidism of renal origin: Secondary | ICD-10-CM | POA: Diagnosis not present

## 2020-04-25 DIAGNOSIS — Z992 Dependence on renal dialysis: Secondary | ICD-10-CM | POA: Diagnosis not present

## 2020-04-25 DIAGNOSIS — N186 End stage renal disease: Secondary | ICD-10-CM | POA: Diagnosis not present

## 2020-04-25 DIAGNOSIS — K739 Chronic hepatitis, unspecified: Secondary | ICD-10-CM | POA: Diagnosis not present

## 2020-04-27 DIAGNOSIS — Z992 Dependence on renal dialysis: Secondary | ICD-10-CM | POA: Diagnosis not present

## 2020-04-27 DIAGNOSIS — N2581 Secondary hyperparathyroidism of renal origin: Secondary | ICD-10-CM | POA: Diagnosis not present

## 2020-04-27 DIAGNOSIS — N186 End stage renal disease: Secondary | ICD-10-CM | POA: Diagnosis not present

## 2020-04-27 DIAGNOSIS — D631 Anemia in chronic kidney disease: Secondary | ICD-10-CM | POA: Diagnosis not present

## 2020-04-27 DIAGNOSIS — D689 Coagulation defect, unspecified: Secondary | ICD-10-CM | POA: Diagnosis not present

## 2020-04-27 DIAGNOSIS — E039 Hypothyroidism, unspecified: Secondary | ICD-10-CM | POA: Diagnosis not present

## 2020-04-27 DIAGNOSIS — K739 Chronic hepatitis, unspecified: Secondary | ICD-10-CM | POA: Diagnosis not present

## 2020-04-29 DIAGNOSIS — D689 Coagulation defect, unspecified: Secondary | ICD-10-CM | POA: Diagnosis not present

## 2020-04-29 DIAGNOSIS — E039 Hypothyroidism, unspecified: Secondary | ICD-10-CM | POA: Diagnosis not present

## 2020-04-29 DIAGNOSIS — N2581 Secondary hyperparathyroidism of renal origin: Secondary | ICD-10-CM | POA: Diagnosis not present

## 2020-04-29 DIAGNOSIS — Z992 Dependence on renal dialysis: Secondary | ICD-10-CM | POA: Diagnosis not present

## 2020-04-29 DIAGNOSIS — K739 Chronic hepatitis, unspecified: Secondary | ICD-10-CM | POA: Diagnosis not present

## 2020-04-29 DIAGNOSIS — N186 End stage renal disease: Secondary | ICD-10-CM | POA: Diagnosis not present

## 2020-04-29 DIAGNOSIS — D631 Anemia in chronic kidney disease: Secondary | ICD-10-CM | POA: Diagnosis not present

## 2020-05-02 DIAGNOSIS — D689 Coagulation defect, unspecified: Secondary | ICD-10-CM | POA: Diagnosis not present

## 2020-05-02 DIAGNOSIS — E039 Hypothyroidism, unspecified: Secondary | ICD-10-CM | POA: Diagnosis not present

## 2020-05-02 DIAGNOSIS — D631 Anemia in chronic kidney disease: Secondary | ICD-10-CM | POA: Diagnosis not present

## 2020-05-02 DIAGNOSIS — N2581 Secondary hyperparathyroidism of renal origin: Secondary | ICD-10-CM | POA: Diagnosis not present

## 2020-05-02 DIAGNOSIS — Z992 Dependence on renal dialysis: Secondary | ICD-10-CM | POA: Diagnosis not present

## 2020-05-02 DIAGNOSIS — N186 End stage renal disease: Secondary | ICD-10-CM | POA: Diagnosis not present

## 2020-05-02 DIAGNOSIS — K739 Chronic hepatitis, unspecified: Secondary | ICD-10-CM | POA: Diagnosis not present

## 2020-05-04 DIAGNOSIS — K739 Chronic hepatitis, unspecified: Secondary | ICD-10-CM | POA: Diagnosis not present

## 2020-05-04 DIAGNOSIS — E039 Hypothyroidism, unspecified: Secondary | ICD-10-CM | POA: Diagnosis not present

## 2020-05-04 DIAGNOSIS — N186 End stage renal disease: Secondary | ICD-10-CM | POA: Diagnosis not present

## 2020-05-04 DIAGNOSIS — D689 Coagulation defect, unspecified: Secondary | ICD-10-CM | POA: Diagnosis not present

## 2020-05-04 DIAGNOSIS — D631 Anemia in chronic kidney disease: Secondary | ICD-10-CM | POA: Diagnosis not present

## 2020-05-04 DIAGNOSIS — Z992 Dependence on renal dialysis: Secondary | ICD-10-CM | POA: Diagnosis not present

## 2020-05-04 DIAGNOSIS — N2581 Secondary hyperparathyroidism of renal origin: Secondary | ICD-10-CM | POA: Diagnosis not present

## 2020-05-05 DIAGNOSIS — A319 Mycobacterial infection, unspecified: Secondary | ICD-10-CM | POA: Diagnosis not present

## 2020-05-05 DIAGNOSIS — M0579 Rheumatoid arthritis with rheumatoid factor of multiple sites without organ or systems involvement: Secondary | ICD-10-CM | POA: Diagnosis not present

## 2020-05-05 DIAGNOSIS — N186 End stage renal disease: Secondary | ICD-10-CM | POA: Diagnosis not present

## 2020-05-05 DIAGNOSIS — M2550 Pain in unspecified joint: Secondary | ICD-10-CM | POA: Diagnosis not present

## 2020-05-06 DIAGNOSIS — D689 Coagulation defect, unspecified: Secondary | ICD-10-CM | POA: Diagnosis not present

## 2020-05-06 DIAGNOSIS — D631 Anemia in chronic kidney disease: Secondary | ICD-10-CM | POA: Diagnosis not present

## 2020-05-06 DIAGNOSIS — E039 Hypothyroidism, unspecified: Secondary | ICD-10-CM | POA: Diagnosis not present

## 2020-05-06 DIAGNOSIS — N2581 Secondary hyperparathyroidism of renal origin: Secondary | ICD-10-CM | POA: Diagnosis not present

## 2020-05-06 DIAGNOSIS — Z992 Dependence on renal dialysis: Secondary | ICD-10-CM | POA: Diagnosis not present

## 2020-05-06 DIAGNOSIS — K739 Chronic hepatitis, unspecified: Secondary | ICD-10-CM | POA: Diagnosis not present

## 2020-05-06 DIAGNOSIS — N186 End stage renal disease: Secondary | ICD-10-CM | POA: Diagnosis not present

## 2020-05-09 DIAGNOSIS — D631 Anemia in chronic kidney disease: Secondary | ICD-10-CM | POA: Diagnosis not present

## 2020-05-09 DIAGNOSIS — N2581 Secondary hyperparathyroidism of renal origin: Secondary | ICD-10-CM | POA: Diagnosis not present

## 2020-05-09 DIAGNOSIS — K739 Chronic hepatitis, unspecified: Secondary | ICD-10-CM | POA: Diagnosis not present

## 2020-05-09 DIAGNOSIS — D689 Coagulation defect, unspecified: Secondary | ICD-10-CM | POA: Diagnosis not present

## 2020-05-09 DIAGNOSIS — N186 End stage renal disease: Secondary | ICD-10-CM | POA: Diagnosis not present

## 2020-05-09 DIAGNOSIS — E039 Hypothyroidism, unspecified: Secondary | ICD-10-CM | POA: Diagnosis not present

## 2020-05-09 DIAGNOSIS — Z992 Dependence on renal dialysis: Secondary | ICD-10-CM | POA: Diagnosis not present

## 2020-05-11 DIAGNOSIS — N186 End stage renal disease: Secondary | ICD-10-CM | POA: Diagnosis not present

## 2020-05-11 DIAGNOSIS — D689 Coagulation defect, unspecified: Secondary | ICD-10-CM | POA: Diagnosis not present

## 2020-05-11 DIAGNOSIS — E039 Hypothyroidism, unspecified: Secondary | ICD-10-CM | POA: Diagnosis not present

## 2020-05-11 DIAGNOSIS — K739 Chronic hepatitis, unspecified: Secondary | ICD-10-CM | POA: Diagnosis not present

## 2020-05-11 DIAGNOSIS — D631 Anemia in chronic kidney disease: Secondary | ICD-10-CM | POA: Diagnosis not present

## 2020-05-11 DIAGNOSIS — N2581 Secondary hyperparathyroidism of renal origin: Secondary | ICD-10-CM | POA: Diagnosis not present

## 2020-05-11 DIAGNOSIS — Z992 Dependence on renal dialysis: Secondary | ICD-10-CM | POA: Diagnosis not present

## 2020-05-13 DIAGNOSIS — Z992 Dependence on renal dialysis: Secondary | ICD-10-CM | POA: Diagnosis not present

## 2020-05-13 DIAGNOSIS — N2581 Secondary hyperparathyroidism of renal origin: Secondary | ICD-10-CM | POA: Diagnosis not present

## 2020-05-13 DIAGNOSIS — D689 Coagulation defect, unspecified: Secondary | ICD-10-CM | POA: Diagnosis not present

## 2020-05-13 DIAGNOSIS — E039 Hypothyroidism, unspecified: Secondary | ICD-10-CM | POA: Diagnosis not present

## 2020-05-13 DIAGNOSIS — D631 Anemia in chronic kidney disease: Secondary | ICD-10-CM | POA: Diagnosis not present

## 2020-05-13 DIAGNOSIS — K739 Chronic hepatitis, unspecified: Secondary | ICD-10-CM | POA: Diagnosis not present

## 2020-05-13 DIAGNOSIS — N186 End stage renal disease: Secondary | ICD-10-CM | POA: Diagnosis not present

## 2020-05-16 DIAGNOSIS — K739 Chronic hepatitis, unspecified: Secondary | ICD-10-CM | POA: Diagnosis not present

## 2020-05-16 DIAGNOSIS — Z992 Dependence on renal dialysis: Secondary | ICD-10-CM | POA: Diagnosis not present

## 2020-05-16 DIAGNOSIS — N2581 Secondary hyperparathyroidism of renal origin: Secondary | ICD-10-CM | POA: Diagnosis not present

## 2020-05-16 DIAGNOSIS — D631 Anemia in chronic kidney disease: Secondary | ICD-10-CM | POA: Diagnosis not present

## 2020-05-16 DIAGNOSIS — E039 Hypothyroidism, unspecified: Secondary | ICD-10-CM | POA: Diagnosis not present

## 2020-05-16 DIAGNOSIS — N186 End stage renal disease: Secondary | ICD-10-CM | POA: Diagnosis not present

## 2020-05-16 DIAGNOSIS — D689 Coagulation defect, unspecified: Secondary | ICD-10-CM | POA: Diagnosis not present

## 2020-05-18 DIAGNOSIS — N186 End stage renal disease: Secondary | ICD-10-CM | POA: Diagnosis not present

## 2020-05-18 DIAGNOSIS — Z992 Dependence on renal dialysis: Secondary | ICD-10-CM | POA: Diagnosis not present

## 2020-05-18 DIAGNOSIS — N2581 Secondary hyperparathyroidism of renal origin: Secondary | ICD-10-CM | POA: Diagnosis not present

## 2020-05-18 DIAGNOSIS — D689 Coagulation defect, unspecified: Secondary | ICD-10-CM | POA: Diagnosis not present

## 2020-05-18 DIAGNOSIS — D631 Anemia in chronic kidney disease: Secondary | ICD-10-CM | POA: Diagnosis not present

## 2020-05-18 DIAGNOSIS — E039 Hypothyroidism, unspecified: Secondary | ICD-10-CM | POA: Diagnosis not present

## 2020-05-18 DIAGNOSIS — K739 Chronic hepatitis, unspecified: Secondary | ICD-10-CM | POA: Diagnosis not present

## 2020-05-20 DIAGNOSIS — E039 Hypothyroidism, unspecified: Secondary | ICD-10-CM | POA: Diagnosis not present

## 2020-05-20 DIAGNOSIS — D631 Anemia in chronic kidney disease: Secondary | ICD-10-CM | POA: Diagnosis not present

## 2020-05-20 DIAGNOSIS — Z992 Dependence on renal dialysis: Secondary | ICD-10-CM | POA: Diagnosis not present

## 2020-05-20 DIAGNOSIS — K739 Chronic hepatitis, unspecified: Secondary | ICD-10-CM | POA: Diagnosis not present

## 2020-05-20 DIAGNOSIS — D689 Coagulation defect, unspecified: Secondary | ICD-10-CM | POA: Diagnosis not present

## 2020-05-20 DIAGNOSIS — N186 End stage renal disease: Secondary | ICD-10-CM | POA: Diagnosis not present

## 2020-05-20 DIAGNOSIS — N2581 Secondary hyperparathyroidism of renal origin: Secondary | ICD-10-CM | POA: Diagnosis not present

## 2020-05-23 DIAGNOSIS — D689 Coagulation defect, unspecified: Secondary | ICD-10-CM | POA: Diagnosis not present

## 2020-05-23 DIAGNOSIS — D631 Anemia in chronic kidney disease: Secondary | ICD-10-CM | POA: Diagnosis not present

## 2020-05-23 DIAGNOSIS — N2581 Secondary hyperparathyroidism of renal origin: Secondary | ICD-10-CM | POA: Diagnosis not present

## 2020-05-23 DIAGNOSIS — N186 End stage renal disease: Secondary | ICD-10-CM | POA: Diagnosis not present

## 2020-05-23 DIAGNOSIS — E039 Hypothyroidism, unspecified: Secondary | ICD-10-CM | POA: Diagnosis not present

## 2020-05-23 DIAGNOSIS — K739 Chronic hepatitis, unspecified: Secondary | ICD-10-CM | POA: Diagnosis not present

## 2020-05-23 DIAGNOSIS — Z992 Dependence on renal dialysis: Secondary | ICD-10-CM | POA: Diagnosis not present

## 2020-05-23 DIAGNOSIS — T8612 Kidney transplant failure: Secondary | ICD-10-CM | POA: Diagnosis not present

## 2020-05-25 DIAGNOSIS — D689 Coagulation defect, unspecified: Secondary | ICD-10-CM | POA: Diagnosis not present

## 2020-05-25 DIAGNOSIS — N2581 Secondary hyperparathyroidism of renal origin: Secondary | ICD-10-CM | POA: Diagnosis not present

## 2020-05-25 DIAGNOSIS — E039 Hypothyroidism, unspecified: Secondary | ICD-10-CM | POA: Diagnosis not present

## 2020-05-25 DIAGNOSIS — K739 Chronic hepatitis, unspecified: Secondary | ICD-10-CM | POA: Diagnosis not present

## 2020-05-25 DIAGNOSIS — D631 Anemia in chronic kidney disease: Secondary | ICD-10-CM | POA: Diagnosis not present

## 2020-05-25 DIAGNOSIS — N186 End stage renal disease: Secondary | ICD-10-CM | POA: Diagnosis not present

## 2020-05-25 DIAGNOSIS — Z992 Dependence on renal dialysis: Secondary | ICD-10-CM | POA: Diagnosis not present

## 2020-05-27 DIAGNOSIS — D631 Anemia in chronic kidney disease: Secondary | ICD-10-CM | POA: Diagnosis not present

## 2020-05-27 DIAGNOSIS — N186 End stage renal disease: Secondary | ICD-10-CM | POA: Diagnosis not present

## 2020-05-27 DIAGNOSIS — E039 Hypothyroidism, unspecified: Secondary | ICD-10-CM | POA: Diagnosis not present

## 2020-05-27 DIAGNOSIS — K739 Chronic hepatitis, unspecified: Secondary | ICD-10-CM | POA: Diagnosis not present

## 2020-05-27 DIAGNOSIS — D689 Coagulation defect, unspecified: Secondary | ICD-10-CM | POA: Diagnosis not present

## 2020-05-27 DIAGNOSIS — N2581 Secondary hyperparathyroidism of renal origin: Secondary | ICD-10-CM | POA: Diagnosis not present

## 2020-05-27 DIAGNOSIS — Z992 Dependence on renal dialysis: Secondary | ICD-10-CM | POA: Diagnosis not present

## 2020-05-30 DIAGNOSIS — N186 End stage renal disease: Secondary | ICD-10-CM | POA: Diagnosis not present

## 2020-05-30 DIAGNOSIS — N2581 Secondary hyperparathyroidism of renal origin: Secondary | ICD-10-CM | POA: Diagnosis not present

## 2020-05-30 DIAGNOSIS — E039 Hypothyroidism, unspecified: Secondary | ICD-10-CM | POA: Diagnosis not present

## 2020-05-30 DIAGNOSIS — D689 Coagulation defect, unspecified: Secondary | ICD-10-CM | POA: Diagnosis not present

## 2020-05-30 DIAGNOSIS — K739 Chronic hepatitis, unspecified: Secondary | ICD-10-CM | POA: Diagnosis not present

## 2020-05-30 DIAGNOSIS — D631 Anemia in chronic kidney disease: Secondary | ICD-10-CM | POA: Diagnosis not present

## 2020-05-30 DIAGNOSIS — Z992 Dependence on renal dialysis: Secondary | ICD-10-CM | POA: Diagnosis not present

## 2020-05-31 DIAGNOSIS — Z0120 Encounter for dental examination and cleaning without abnormal findings: Secondary | ICD-10-CM | POA: Diagnosis not present

## 2020-06-01 DIAGNOSIS — D689 Coagulation defect, unspecified: Secondary | ICD-10-CM | POA: Diagnosis not present

## 2020-06-01 DIAGNOSIS — N186 End stage renal disease: Secondary | ICD-10-CM | POA: Diagnosis not present

## 2020-06-01 DIAGNOSIS — K739 Chronic hepatitis, unspecified: Secondary | ICD-10-CM | POA: Diagnosis not present

## 2020-06-01 DIAGNOSIS — N2581 Secondary hyperparathyroidism of renal origin: Secondary | ICD-10-CM | POA: Diagnosis not present

## 2020-06-01 DIAGNOSIS — E039 Hypothyroidism, unspecified: Secondary | ICD-10-CM | POA: Diagnosis not present

## 2020-06-01 DIAGNOSIS — Z992 Dependence on renal dialysis: Secondary | ICD-10-CM | POA: Diagnosis not present

## 2020-06-01 DIAGNOSIS — D631 Anemia in chronic kidney disease: Secondary | ICD-10-CM | POA: Diagnosis not present

## 2020-06-03 DIAGNOSIS — D689 Coagulation defect, unspecified: Secondary | ICD-10-CM | POA: Diagnosis not present

## 2020-06-03 DIAGNOSIS — D631 Anemia in chronic kidney disease: Secondary | ICD-10-CM | POA: Diagnosis not present

## 2020-06-03 DIAGNOSIS — K739 Chronic hepatitis, unspecified: Secondary | ICD-10-CM | POA: Diagnosis not present

## 2020-06-03 DIAGNOSIS — N186 End stage renal disease: Secondary | ICD-10-CM | POA: Diagnosis not present

## 2020-06-03 DIAGNOSIS — Z992 Dependence on renal dialysis: Secondary | ICD-10-CM | POA: Diagnosis not present

## 2020-06-03 DIAGNOSIS — N2581 Secondary hyperparathyroidism of renal origin: Secondary | ICD-10-CM | POA: Diagnosis not present

## 2020-06-03 DIAGNOSIS — E039 Hypothyroidism, unspecified: Secondary | ICD-10-CM | POA: Diagnosis not present

## 2020-06-06 DIAGNOSIS — D631 Anemia in chronic kidney disease: Secondary | ICD-10-CM | POA: Diagnosis not present

## 2020-06-06 DIAGNOSIS — N2581 Secondary hyperparathyroidism of renal origin: Secondary | ICD-10-CM | POA: Diagnosis not present

## 2020-06-06 DIAGNOSIS — D689 Coagulation defect, unspecified: Secondary | ICD-10-CM | POA: Diagnosis not present

## 2020-06-06 DIAGNOSIS — E039 Hypothyroidism, unspecified: Secondary | ICD-10-CM | POA: Diagnosis not present

## 2020-06-06 DIAGNOSIS — N186 End stage renal disease: Secondary | ICD-10-CM | POA: Diagnosis not present

## 2020-06-06 DIAGNOSIS — K739 Chronic hepatitis, unspecified: Secondary | ICD-10-CM | POA: Diagnosis not present

## 2020-06-06 DIAGNOSIS — Z992 Dependence on renal dialysis: Secondary | ICD-10-CM | POA: Diagnosis not present

## 2020-06-08 DIAGNOSIS — K739 Chronic hepatitis, unspecified: Secondary | ICD-10-CM | POA: Diagnosis not present

## 2020-06-08 DIAGNOSIS — D689 Coagulation defect, unspecified: Secondary | ICD-10-CM | POA: Diagnosis not present

## 2020-06-08 DIAGNOSIS — Z992 Dependence on renal dialysis: Secondary | ICD-10-CM | POA: Diagnosis not present

## 2020-06-08 DIAGNOSIS — D631 Anemia in chronic kidney disease: Secondary | ICD-10-CM | POA: Diagnosis not present

## 2020-06-08 DIAGNOSIS — E039 Hypothyroidism, unspecified: Secondary | ICD-10-CM | POA: Diagnosis not present

## 2020-06-08 DIAGNOSIS — N186 End stage renal disease: Secondary | ICD-10-CM | POA: Diagnosis not present

## 2020-06-08 DIAGNOSIS — N2581 Secondary hyperparathyroidism of renal origin: Secondary | ICD-10-CM | POA: Diagnosis not present

## 2020-06-09 DIAGNOSIS — J4550 Severe persistent asthma, uncomplicated: Secondary | ICD-10-CM | POA: Diagnosis not present

## 2020-06-09 DIAGNOSIS — D3A090 Benign carcinoid tumor of the bronchus and lung: Secondary | ICD-10-CM | POA: Diagnosis not present

## 2020-06-09 DIAGNOSIS — R05 Cough: Secondary | ICD-10-CM | POA: Diagnosis not present

## 2020-06-09 DIAGNOSIS — R911 Solitary pulmonary nodule: Secondary | ICD-10-CM | POA: Diagnosis not present

## 2020-06-10 DIAGNOSIS — N2581 Secondary hyperparathyroidism of renal origin: Secondary | ICD-10-CM | POA: Diagnosis not present

## 2020-06-10 DIAGNOSIS — E039 Hypothyroidism, unspecified: Secondary | ICD-10-CM | POA: Diagnosis not present

## 2020-06-10 DIAGNOSIS — N186 End stage renal disease: Secondary | ICD-10-CM | POA: Diagnosis not present

## 2020-06-10 DIAGNOSIS — K739 Chronic hepatitis, unspecified: Secondary | ICD-10-CM | POA: Diagnosis not present

## 2020-06-10 DIAGNOSIS — D689 Coagulation defect, unspecified: Secondary | ICD-10-CM | POA: Diagnosis not present

## 2020-06-10 DIAGNOSIS — Z992 Dependence on renal dialysis: Secondary | ICD-10-CM | POA: Diagnosis not present

## 2020-06-10 DIAGNOSIS — D631 Anemia in chronic kidney disease: Secondary | ICD-10-CM | POA: Diagnosis not present

## 2020-06-13 DIAGNOSIS — N2581 Secondary hyperparathyroidism of renal origin: Secondary | ICD-10-CM | POA: Diagnosis not present

## 2020-06-13 DIAGNOSIS — N186 End stage renal disease: Secondary | ICD-10-CM | POA: Diagnosis not present

## 2020-06-13 DIAGNOSIS — D689 Coagulation defect, unspecified: Secondary | ICD-10-CM | POA: Diagnosis not present

## 2020-06-13 DIAGNOSIS — E039 Hypothyroidism, unspecified: Secondary | ICD-10-CM | POA: Diagnosis not present

## 2020-06-13 DIAGNOSIS — Z992 Dependence on renal dialysis: Secondary | ICD-10-CM | POA: Diagnosis not present

## 2020-06-13 DIAGNOSIS — K739 Chronic hepatitis, unspecified: Secondary | ICD-10-CM | POA: Diagnosis not present

## 2020-06-13 DIAGNOSIS — D631 Anemia in chronic kidney disease: Secondary | ICD-10-CM | POA: Diagnosis not present

## 2020-06-15 DIAGNOSIS — D689 Coagulation defect, unspecified: Secondary | ICD-10-CM | POA: Diagnosis not present

## 2020-06-15 DIAGNOSIS — K739 Chronic hepatitis, unspecified: Secondary | ICD-10-CM | POA: Diagnosis not present

## 2020-06-15 DIAGNOSIS — E039 Hypothyroidism, unspecified: Secondary | ICD-10-CM | POA: Diagnosis not present

## 2020-06-15 DIAGNOSIS — N2581 Secondary hyperparathyroidism of renal origin: Secondary | ICD-10-CM | POA: Diagnosis not present

## 2020-06-15 DIAGNOSIS — D631 Anemia in chronic kidney disease: Secondary | ICD-10-CM | POA: Diagnosis not present

## 2020-06-15 DIAGNOSIS — N186 End stage renal disease: Secondary | ICD-10-CM | POA: Diagnosis not present

## 2020-06-15 DIAGNOSIS — Z992 Dependence on renal dialysis: Secondary | ICD-10-CM | POA: Diagnosis not present

## 2020-06-16 DIAGNOSIS — H5340 Unspecified visual field defects: Secondary | ICD-10-CM | POA: Diagnosis not present

## 2020-06-16 DIAGNOSIS — H2513 Age-related nuclear cataract, bilateral: Secondary | ICD-10-CM | POA: Diagnosis not present

## 2020-06-17 DIAGNOSIS — D689 Coagulation defect, unspecified: Secondary | ICD-10-CM | POA: Diagnosis not present

## 2020-06-17 DIAGNOSIS — D631 Anemia in chronic kidney disease: Secondary | ICD-10-CM | POA: Diagnosis not present

## 2020-06-17 DIAGNOSIS — Z992 Dependence on renal dialysis: Secondary | ICD-10-CM | POA: Diagnosis not present

## 2020-06-17 DIAGNOSIS — N2581 Secondary hyperparathyroidism of renal origin: Secondary | ICD-10-CM | POA: Diagnosis not present

## 2020-06-17 DIAGNOSIS — N186 End stage renal disease: Secondary | ICD-10-CM | POA: Diagnosis not present

## 2020-06-17 DIAGNOSIS — E039 Hypothyroidism, unspecified: Secondary | ICD-10-CM | POA: Diagnosis not present

## 2020-06-17 DIAGNOSIS — K739 Chronic hepatitis, unspecified: Secondary | ICD-10-CM | POA: Diagnosis not present

## 2020-06-20 DIAGNOSIS — D631 Anemia in chronic kidney disease: Secondary | ICD-10-CM | POA: Diagnosis not present

## 2020-06-20 DIAGNOSIS — N2581 Secondary hyperparathyroidism of renal origin: Secondary | ICD-10-CM | POA: Diagnosis not present

## 2020-06-20 DIAGNOSIS — K739 Chronic hepatitis, unspecified: Secondary | ICD-10-CM | POA: Diagnosis not present

## 2020-06-20 DIAGNOSIS — E039 Hypothyroidism, unspecified: Secondary | ICD-10-CM | POA: Diagnosis not present

## 2020-06-20 DIAGNOSIS — N186 End stage renal disease: Secondary | ICD-10-CM | POA: Diagnosis not present

## 2020-06-20 DIAGNOSIS — D689 Coagulation defect, unspecified: Secondary | ICD-10-CM | POA: Diagnosis not present

## 2020-06-20 DIAGNOSIS — Z992 Dependence on renal dialysis: Secondary | ICD-10-CM | POA: Diagnosis not present

## 2020-06-22 DIAGNOSIS — T8612 Kidney transplant failure: Secondary | ICD-10-CM | POA: Diagnosis not present

## 2020-06-22 DIAGNOSIS — E039 Hypothyroidism, unspecified: Secondary | ICD-10-CM | POA: Diagnosis not present

## 2020-06-22 DIAGNOSIS — Z992 Dependence on renal dialysis: Secondary | ICD-10-CM | POA: Diagnosis not present

## 2020-06-22 DIAGNOSIS — D631 Anemia in chronic kidney disease: Secondary | ICD-10-CM | POA: Diagnosis not present

## 2020-06-22 DIAGNOSIS — K739 Chronic hepatitis, unspecified: Secondary | ICD-10-CM | POA: Diagnosis not present

## 2020-06-22 DIAGNOSIS — N2581 Secondary hyperparathyroidism of renal origin: Secondary | ICD-10-CM | POA: Diagnosis not present

## 2020-06-22 DIAGNOSIS — N186 End stage renal disease: Secondary | ICD-10-CM | POA: Diagnosis not present

## 2020-06-22 DIAGNOSIS — D689 Coagulation defect, unspecified: Secondary | ICD-10-CM | POA: Diagnosis not present

## 2020-06-24 DIAGNOSIS — N186 End stage renal disease: Secondary | ICD-10-CM | POA: Diagnosis not present

## 2020-06-24 DIAGNOSIS — K739 Chronic hepatitis, unspecified: Secondary | ICD-10-CM | POA: Diagnosis not present

## 2020-06-24 DIAGNOSIS — D689 Coagulation defect, unspecified: Secondary | ICD-10-CM | POA: Diagnosis not present

## 2020-06-24 DIAGNOSIS — N2581 Secondary hyperparathyroidism of renal origin: Secondary | ICD-10-CM | POA: Diagnosis not present

## 2020-06-24 DIAGNOSIS — Z992 Dependence on renal dialysis: Secondary | ICD-10-CM | POA: Diagnosis not present

## 2020-06-24 DIAGNOSIS — D631 Anemia in chronic kidney disease: Secondary | ICD-10-CM | POA: Diagnosis not present

## 2020-06-24 DIAGNOSIS — E1129 Type 2 diabetes mellitus with other diabetic kidney complication: Secondary | ICD-10-CM | POA: Diagnosis not present

## 2020-06-24 DIAGNOSIS — E039 Hypothyroidism, unspecified: Secondary | ICD-10-CM | POA: Diagnosis not present

## 2020-06-27 DIAGNOSIS — E039 Hypothyroidism, unspecified: Secondary | ICD-10-CM | POA: Diagnosis not present

## 2020-06-27 DIAGNOSIS — D689 Coagulation defect, unspecified: Secondary | ICD-10-CM | POA: Diagnosis not present

## 2020-06-27 DIAGNOSIS — K739 Chronic hepatitis, unspecified: Secondary | ICD-10-CM | POA: Diagnosis not present

## 2020-06-27 DIAGNOSIS — E1129 Type 2 diabetes mellitus with other diabetic kidney complication: Secondary | ICD-10-CM | POA: Diagnosis not present

## 2020-06-27 DIAGNOSIS — D631 Anemia in chronic kidney disease: Secondary | ICD-10-CM | POA: Diagnosis not present

## 2020-06-27 DIAGNOSIS — N2581 Secondary hyperparathyroidism of renal origin: Secondary | ICD-10-CM | POA: Diagnosis not present

## 2020-06-27 DIAGNOSIS — N186 End stage renal disease: Secondary | ICD-10-CM | POA: Diagnosis not present

## 2020-06-27 DIAGNOSIS — Z992 Dependence on renal dialysis: Secondary | ICD-10-CM | POA: Diagnosis not present

## 2020-06-29 DIAGNOSIS — Z992 Dependence on renal dialysis: Secondary | ICD-10-CM | POA: Diagnosis not present

## 2020-06-29 DIAGNOSIS — E039 Hypothyroidism, unspecified: Secondary | ICD-10-CM | POA: Diagnosis not present

## 2020-06-29 DIAGNOSIS — E1129 Type 2 diabetes mellitus with other diabetic kidney complication: Secondary | ICD-10-CM | POA: Diagnosis not present

## 2020-06-29 DIAGNOSIS — N186 End stage renal disease: Secondary | ICD-10-CM | POA: Diagnosis not present

## 2020-06-29 DIAGNOSIS — D631 Anemia in chronic kidney disease: Secondary | ICD-10-CM | POA: Diagnosis not present

## 2020-06-29 DIAGNOSIS — K739 Chronic hepatitis, unspecified: Secondary | ICD-10-CM | POA: Diagnosis not present

## 2020-06-29 DIAGNOSIS — N2581 Secondary hyperparathyroidism of renal origin: Secondary | ICD-10-CM | POA: Diagnosis not present

## 2020-06-29 DIAGNOSIS — D689 Coagulation defect, unspecified: Secondary | ICD-10-CM | POA: Diagnosis not present

## 2020-07-01 DIAGNOSIS — D631 Anemia in chronic kidney disease: Secondary | ICD-10-CM | POA: Diagnosis not present

## 2020-07-01 DIAGNOSIS — N186 End stage renal disease: Secondary | ICD-10-CM | POA: Diagnosis not present

## 2020-07-01 DIAGNOSIS — K739 Chronic hepatitis, unspecified: Secondary | ICD-10-CM | POA: Diagnosis not present

## 2020-07-01 DIAGNOSIS — D689 Coagulation defect, unspecified: Secondary | ICD-10-CM | POA: Diagnosis not present

## 2020-07-01 DIAGNOSIS — Z992 Dependence on renal dialysis: Secondary | ICD-10-CM | POA: Diagnosis not present

## 2020-07-01 DIAGNOSIS — N2581 Secondary hyperparathyroidism of renal origin: Secondary | ICD-10-CM | POA: Diagnosis not present

## 2020-07-01 DIAGNOSIS — E1129 Type 2 diabetes mellitus with other diabetic kidney complication: Secondary | ICD-10-CM | POA: Diagnosis not present

## 2020-07-01 DIAGNOSIS — E039 Hypothyroidism, unspecified: Secondary | ICD-10-CM | POA: Diagnosis not present

## 2020-07-04 ENCOUNTER — Other Ambulatory Visit: Payer: Self-pay | Admitting: Family Medicine

## 2020-07-04 DIAGNOSIS — E039 Hypothyroidism, unspecified: Secondary | ICD-10-CM | POA: Diagnosis not present

## 2020-07-04 DIAGNOSIS — E1129 Type 2 diabetes mellitus with other diabetic kidney complication: Secondary | ICD-10-CM | POA: Diagnosis not present

## 2020-07-04 DIAGNOSIS — K739 Chronic hepatitis, unspecified: Secondary | ICD-10-CM | POA: Diagnosis not present

## 2020-07-04 DIAGNOSIS — I671 Cerebral aneurysm, nonruptured: Secondary | ICD-10-CM

## 2020-07-04 DIAGNOSIS — Z992 Dependence on renal dialysis: Secondary | ICD-10-CM | POA: Diagnosis not present

## 2020-07-04 DIAGNOSIS — N186 End stage renal disease: Secondary | ICD-10-CM | POA: Diagnosis not present

## 2020-07-04 DIAGNOSIS — D689 Coagulation defect, unspecified: Secondary | ICD-10-CM | POA: Diagnosis not present

## 2020-07-04 DIAGNOSIS — D631 Anemia in chronic kidney disease: Secondary | ICD-10-CM | POA: Diagnosis not present

## 2020-07-04 DIAGNOSIS — N2581 Secondary hyperparathyroidism of renal origin: Secondary | ICD-10-CM | POA: Diagnosis not present

## 2020-07-06 DIAGNOSIS — H9202 Otalgia, left ear: Secondary | ICD-10-CM | POA: Diagnosis not present

## 2020-07-06 DIAGNOSIS — N2581 Secondary hyperparathyroidism of renal origin: Secondary | ICD-10-CM | POA: Diagnosis not present

## 2020-07-06 DIAGNOSIS — Z992 Dependence on renal dialysis: Secondary | ICD-10-CM | POA: Diagnosis not present

## 2020-07-06 DIAGNOSIS — D689 Coagulation defect, unspecified: Secondary | ICD-10-CM | POA: Diagnosis not present

## 2020-07-06 DIAGNOSIS — K739 Chronic hepatitis, unspecified: Secondary | ICD-10-CM | POA: Diagnosis not present

## 2020-07-06 DIAGNOSIS — E039 Hypothyroidism, unspecified: Secondary | ICD-10-CM | POA: Diagnosis not present

## 2020-07-06 DIAGNOSIS — E1129 Type 2 diabetes mellitus with other diabetic kidney complication: Secondary | ICD-10-CM | POA: Diagnosis not present

## 2020-07-06 DIAGNOSIS — D631 Anemia in chronic kidney disease: Secondary | ICD-10-CM | POA: Diagnosis not present

## 2020-07-06 DIAGNOSIS — N186 End stage renal disease: Secondary | ICD-10-CM | POA: Diagnosis not present

## 2020-07-08 DIAGNOSIS — D631 Anemia in chronic kidney disease: Secondary | ICD-10-CM | POA: Diagnosis not present

## 2020-07-08 DIAGNOSIS — E1129 Type 2 diabetes mellitus with other diabetic kidney complication: Secondary | ICD-10-CM | POA: Diagnosis not present

## 2020-07-08 DIAGNOSIS — D689 Coagulation defect, unspecified: Secondary | ICD-10-CM | POA: Diagnosis not present

## 2020-07-08 DIAGNOSIS — E039 Hypothyroidism, unspecified: Secondary | ICD-10-CM | POA: Diagnosis not present

## 2020-07-08 DIAGNOSIS — N186 End stage renal disease: Secondary | ICD-10-CM | POA: Diagnosis not present

## 2020-07-08 DIAGNOSIS — N2581 Secondary hyperparathyroidism of renal origin: Secondary | ICD-10-CM | POA: Diagnosis not present

## 2020-07-08 DIAGNOSIS — Z992 Dependence on renal dialysis: Secondary | ICD-10-CM | POA: Diagnosis not present

## 2020-07-08 DIAGNOSIS — K739 Chronic hepatitis, unspecified: Secondary | ICD-10-CM | POA: Diagnosis not present

## 2020-07-11 DIAGNOSIS — K739 Chronic hepatitis, unspecified: Secondary | ICD-10-CM | POA: Diagnosis not present

## 2020-07-11 DIAGNOSIS — Z992 Dependence on renal dialysis: Secondary | ICD-10-CM | POA: Diagnosis not present

## 2020-07-11 DIAGNOSIS — D631 Anemia in chronic kidney disease: Secondary | ICD-10-CM | POA: Diagnosis not present

## 2020-07-11 DIAGNOSIS — D689 Coagulation defect, unspecified: Secondary | ICD-10-CM | POA: Diagnosis not present

## 2020-07-11 DIAGNOSIS — N2581 Secondary hyperparathyroidism of renal origin: Secondary | ICD-10-CM | POA: Diagnosis not present

## 2020-07-11 DIAGNOSIS — E1129 Type 2 diabetes mellitus with other diabetic kidney complication: Secondary | ICD-10-CM | POA: Diagnosis not present

## 2020-07-11 DIAGNOSIS — E039 Hypothyroidism, unspecified: Secondary | ICD-10-CM | POA: Diagnosis not present

## 2020-07-11 DIAGNOSIS — N186 End stage renal disease: Secondary | ICD-10-CM | POA: Diagnosis not present

## 2020-07-13 DIAGNOSIS — N2581 Secondary hyperparathyroidism of renal origin: Secondary | ICD-10-CM | POA: Diagnosis not present

## 2020-07-13 DIAGNOSIS — E039 Hypothyroidism, unspecified: Secondary | ICD-10-CM | POA: Diagnosis not present

## 2020-07-13 DIAGNOSIS — N186 End stage renal disease: Secondary | ICD-10-CM | POA: Diagnosis not present

## 2020-07-13 DIAGNOSIS — D689 Coagulation defect, unspecified: Secondary | ICD-10-CM | POA: Diagnosis not present

## 2020-07-13 DIAGNOSIS — D631 Anemia in chronic kidney disease: Secondary | ICD-10-CM | POA: Diagnosis not present

## 2020-07-13 DIAGNOSIS — K739 Chronic hepatitis, unspecified: Secondary | ICD-10-CM | POA: Diagnosis not present

## 2020-07-13 DIAGNOSIS — E1129 Type 2 diabetes mellitus with other diabetic kidney complication: Secondary | ICD-10-CM | POA: Diagnosis not present

## 2020-07-13 DIAGNOSIS — Z992 Dependence on renal dialysis: Secondary | ICD-10-CM | POA: Diagnosis not present

## 2020-07-15 DIAGNOSIS — N186 End stage renal disease: Secondary | ICD-10-CM | POA: Diagnosis not present

## 2020-07-15 DIAGNOSIS — E1129 Type 2 diabetes mellitus with other diabetic kidney complication: Secondary | ICD-10-CM | POA: Diagnosis not present

## 2020-07-15 DIAGNOSIS — D689 Coagulation defect, unspecified: Secondary | ICD-10-CM | POA: Diagnosis not present

## 2020-07-15 DIAGNOSIS — D631 Anemia in chronic kidney disease: Secondary | ICD-10-CM | POA: Diagnosis not present

## 2020-07-15 DIAGNOSIS — K739 Chronic hepatitis, unspecified: Secondary | ICD-10-CM | POA: Diagnosis not present

## 2020-07-15 DIAGNOSIS — Z992 Dependence on renal dialysis: Secondary | ICD-10-CM | POA: Diagnosis not present

## 2020-07-15 DIAGNOSIS — N2581 Secondary hyperparathyroidism of renal origin: Secondary | ICD-10-CM | POA: Diagnosis not present

## 2020-07-15 DIAGNOSIS — E039 Hypothyroidism, unspecified: Secondary | ICD-10-CM | POA: Diagnosis not present

## 2020-07-16 ENCOUNTER — Other Ambulatory Visit: Payer: Medicare Other

## 2020-07-17 ENCOUNTER — Other Ambulatory Visit: Payer: Self-pay | Admitting: Rheumatology

## 2020-07-17 DIAGNOSIS — R911 Solitary pulmonary nodule: Secondary | ICD-10-CM

## 2020-07-18 DIAGNOSIS — E039 Hypothyroidism, unspecified: Secondary | ICD-10-CM | POA: Diagnosis not present

## 2020-07-18 DIAGNOSIS — Z992 Dependence on renal dialysis: Secondary | ICD-10-CM | POA: Diagnosis not present

## 2020-07-18 DIAGNOSIS — N186 End stage renal disease: Secondary | ICD-10-CM | POA: Diagnosis not present

## 2020-07-18 DIAGNOSIS — D631 Anemia in chronic kidney disease: Secondary | ICD-10-CM | POA: Diagnosis not present

## 2020-07-18 DIAGNOSIS — N2581 Secondary hyperparathyroidism of renal origin: Secondary | ICD-10-CM | POA: Diagnosis not present

## 2020-07-18 DIAGNOSIS — K739 Chronic hepatitis, unspecified: Secondary | ICD-10-CM | POA: Diagnosis not present

## 2020-07-18 DIAGNOSIS — E1129 Type 2 diabetes mellitus with other diabetic kidney complication: Secondary | ICD-10-CM | POA: Diagnosis not present

## 2020-07-18 DIAGNOSIS — D689 Coagulation defect, unspecified: Secondary | ICD-10-CM | POA: Diagnosis not present

## 2020-07-20 DIAGNOSIS — Z992 Dependence on renal dialysis: Secondary | ICD-10-CM | POA: Diagnosis not present

## 2020-07-20 DIAGNOSIS — N186 End stage renal disease: Secondary | ICD-10-CM | POA: Diagnosis not present

## 2020-07-20 DIAGNOSIS — N2581 Secondary hyperparathyroidism of renal origin: Secondary | ICD-10-CM | POA: Diagnosis not present

## 2020-07-20 DIAGNOSIS — D689 Coagulation defect, unspecified: Secondary | ICD-10-CM | POA: Diagnosis not present

## 2020-07-20 DIAGNOSIS — E039 Hypothyroidism, unspecified: Secondary | ICD-10-CM | POA: Diagnosis not present

## 2020-07-20 DIAGNOSIS — D631 Anemia in chronic kidney disease: Secondary | ICD-10-CM | POA: Diagnosis not present

## 2020-07-20 DIAGNOSIS — K739 Chronic hepatitis, unspecified: Secondary | ICD-10-CM | POA: Diagnosis not present

## 2020-07-20 DIAGNOSIS — E1129 Type 2 diabetes mellitus with other diabetic kidney complication: Secondary | ICD-10-CM | POA: Diagnosis not present

## 2020-07-22 DIAGNOSIS — D631 Anemia in chronic kidney disease: Secondary | ICD-10-CM | POA: Diagnosis not present

## 2020-07-22 DIAGNOSIS — N2581 Secondary hyperparathyroidism of renal origin: Secondary | ICD-10-CM | POA: Diagnosis not present

## 2020-07-22 DIAGNOSIS — Z992 Dependence on renal dialysis: Secondary | ICD-10-CM | POA: Diagnosis not present

## 2020-07-22 DIAGNOSIS — N186 End stage renal disease: Secondary | ICD-10-CM | POA: Diagnosis not present

## 2020-07-22 DIAGNOSIS — E1129 Type 2 diabetes mellitus with other diabetic kidney complication: Secondary | ICD-10-CM | POA: Diagnosis not present

## 2020-07-22 DIAGNOSIS — K739 Chronic hepatitis, unspecified: Secondary | ICD-10-CM | POA: Diagnosis not present

## 2020-07-22 DIAGNOSIS — E039 Hypothyroidism, unspecified: Secondary | ICD-10-CM | POA: Diagnosis not present

## 2020-07-22 DIAGNOSIS — D689 Coagulation defect, unspecified: Secondary | ICD-10-CM | POA: Diagnosis not present

## 2020-07-23 DIAGNOSIS — Z992 Dependence on renal dialysis: Secondary | ICD-10-CM | POA: Diagnosis not present

## 2020-07-23 DIAGNOSIS — T8612 Kidney transplant failure: Secondary | ICD-10-CM | POA: Diagnosis not present

## 2020-07-23 DIAGNOSIS — N186 End stage renal disease: Secondary | ICD-10-CM | POA: Diagnosis not present

## 2020-07-25 DIAGNOSIS — D631 Anemia in chronic kidney disease: Secondary | ICD-10-CM | POA: Diagnosis not present

## 2020-07-25 DIAGNOSIS — Z23 Encounter for immunization: Secondary | ICD-10-CM | POA: Diagnosis not present

## 2020-07-25 DIAGNOSIS — N186 End stage renal disease: Secondary | ICD-10-CM | POA: Diagnosis not present

## 2020-07-25 DIAGNOSIS — Z992 Dependence on renal dialysis: Secondary | ICD-10-CM | POA: Diagnosis not present

## 2020-07-25 DIAGNOSIS — D689 Coagulation defect, unspecified: Secondary | ICD-10-CM | POA: Diagnosis not present

## 2020-07-25 DIAGNOSIS — N2581 Secondary hyperparathyroidism of renal origin: Secondary | ICD-10-CM | POA: Diagnosis not present

## 2020-07-27 DIAGNOSIS — D689 Coagulation defect, unspecified: Secondary | ICD-10-CM | POA: Diagnosis not present

## 2020-07-27 DIAGNOSIS — N186 End stage renal disease: Secondary | ICD-10-CM | POA: Diagnosis not present

## 2020-07-27 DIAGNOSIS — D631 Anemia in chronic kidney disease: Secondary | ICD-10-CM | POA: Diagnosis not present

## 2020-07-27 DIAGNOSIS — Z992 Dependence on renal dialysis: Secondary | ICD-10-CM | POA: Diagnosis not present

## 2020-07-27 DIAGNOSIS — N2581 Secondary hyperparathyroidism of renal origin: Secondary | ICD-10-CM | POA: Diagnosis not present

## 2020-07-27 DIAGNOSIS — Z23 Encounter for immunization: Secondary | ICD-10-CM | POA: Diagnosis not present

## 2020-07-29 DIAGNOSIS — D631 Anemia in chronic kidney disease: Secondary | ICD-10-CM | POA: Diagnosis not present

## 2020-07-29 DIAGNOSIS — Z23 Encounter for immunization: Secondary | ICD-10-CM | POA: Diagnosis not present

## 2020-07-29 DIAGNOSIS — D689 Coagulation defect, unspecified: Secondary | ICD-10-CM | POA: Diagnosis not present

## 2020-07-29 DIAGNOSIS — N2581 Secondary hyperparathyroidism of renal origin: Secondary | ICD-10-CM | POA: Diagnosis not present

## 2020-07-29 DIAGNOSIS — Z992 Dependence on renal dialysis: Secondary | ICD-10-CM | POA: Diagnosis not present

## 2020-07-29 DIAGNOSIS — N186 End stage renal disease: Secondary | ICD-10-CM | POA: Diagnosis not present

## 2020-08-01 DIAGNOSIS — D631 Anemia in chronic kidney disease: Secondary | ICD-10-CM | POA: Diagnosis not present

## 2020-08-01 DIAGNOSIS — Z992 Dependence on renal dialysis: Secondary | ICD-10-CM | POA: Diagnosis not present

## 2020-08-01 DIAGNOSIS — Z23 Encounter for immunization: Secondary | ICD-10-CM | POA: Diagnosis not present

## 2020-08-01 DIAGNOSIS — D689 Coagulation defect, unspecified: Secondary | ICD-10-CM | POA: Diagnosis not present

## 2020-08-01 DIAGNOSIS — N186 End stage renal disease: Secondary | ICD-10-CM | POA: Diagnosis not present

## 2020-08-01 DIAGNOSIS — N2581 Secondary hyperparathyroidism of renal origin: Secondary | ICD-10-CM | POA: Diagnosis not present

## 2020-08-05 DIAGNOSIS — Z992 Dependence on renal dialysis: Secondary | ICD-10-CM | POA: Diagnosis not present

## 2020-08-05 DIAGNOSIS — Z23 Encounter for immunization: Secondary | ICD-10-CM | POA: Diagnosis not present

## 2020-08-05 DIAGNOSIS — D689 Coagulation defect, unspecified: Secondary | ICD-10-CM | POA: Diagnosis not present

## 2020-08-05 DIAGNOSIS — N2581 Secondary hyperparathyroidism of renal origin: Secondary | ICD-10-CM | POA: Diagnosis not present

## 2020-08-05 DIAGNOSIS — D631 Anemia in chronic kidney disease: Secondary | ICD-10-CM | POA: Diagnosis not present

## 2020-08-05 DIAGNOSIS — N186 End stage renal disease: Secondary | ICD-10-CM | POA: Diagnosis not present

## 2020-08-08 DIAGNOSIS — Z992 Dependence on renal dialysis: Secondary | ICD-10-CM | POA: Diagnosis not present

## 2020-08-08 DIAGNOSIS — Z23 Encounter for immunization: Secondary | ICD-10-CM | POA: Diagnosis not present

## 2020-08-08 DIAGNOSIS — D631 Anemia in chronic kidney disease: Secondary | ICD-10-CM | POA: Diagnosis not present

## 2020-08-08 DIAGNOSIS — D689 Coagulation defect, unspecified: Secondary | ICD-10-CM | POA: Diagnosis not present

## 2020-08-08 DIAGNOSIS — N186 End stage renal disease: Secondary | ICD-10-CM | POA: Diagnosis not present

## 2020-08-08 DIAGNOSIS — N2581 Secondary hyperparathyroidism of renal origin: Secondary | ICD-10-CM | POA: Diagnosis not present

## 2020-08-10 ENCOUNTER — Ambulatory Visit
Admission: RE | Admit: 2020-08-10 | Discharge: 2020-08-10 | Disposition: A | Discharge status: Home / Self Care | Payer: Medicare Other | Source: Ambulatory Visit | Attending: Rheumatology | Admitting: Rheumatology

## 2020-08-10 DIAGNOSIS — D631 Anemia in chronic kidney disease: Secondary | ICD-10-CM | POA: Diagnosis not present

## 2020-08-10 DIAGNOSIS — Z992 Dependence on renal dialysis: Secondary | ICD-10-CM | POA: Diagnosis not present

## 2020-08-10 DIAGNOSIS — N186 End stage renal disease: Secondary | ICD-10-CM | POA: Diagnosis not present

## 2020-08-10 DIAGNOSIS — N2581 Secondary hyperparathyroidism of renal origin: Secondary | ICD-10-CM | POA: Diagnosis not present

## 2020-08-10 DIAGNOSIS — R918 Other nonspecific abnormal finding of lung field: Secondary | ICD-10-CM | POA: Diagnosis not present

## 2020-08-10 DIAGNOSIS — D689 Coagulation defect, unspecified: Secondary | ICD-10-CM | POA: Diagnosis not present

## 2020-08-10 DIAGNOSIS — Z23 Encounter for immunization: Secondary | ICD-10-CM | POA: Diagnosis not present

## 2020-08-10 DIAGNOSIS — R911 Solitary pulmonary nodule: Secondary | ICD-10-CM

## 2020-08-12 DIAGNOSIS — D689 Coagulation defect, unspecified: Secondary | ICD-10-CM | POA: Diagnosis not present

## 2020-08-12 DIAGNOSIS — N186 End stage renal disease: Secondary | ICD-10-CM | POA: Diagnosis not present

## 2020-08-12 DIAGNOSIS — Z23 Encounter for immunization: Secondary | ICD-10-CM | POA: Diagnosis not present

## 2020-08-12 DIAGNOSIS — N2581 Secondary hyperparathyroidism of renal origin: Secondary | ICD-10-CM | POA: Diagnosis not present

## 2020-08-12 DIAGNOSIS — Z992 Dependence on renal dialysis: Secondary | ICD-10-CM | POA: Diagnosis not present

## 2020-08-12 DIAGNOSIS — D631 Anemia in chronic kidney disease: Secondary | ICD-10-CM | POA: Diagnosis not present

## 2020-08-14 ENCOUNTER — Other Ambulatory Visit: Payer: Medicare Other

## 2020-08-14 DIAGNOSIS — N186 End stage renal disease: Secondary | ICD-10-CM | POA: Diagnosis not present

## 2020-08-14 DIAGNOSIS — Z992 Dependence on renal dialysis: Secondary | ICD-10-CM | POA: Diagnosis not present

## 2020-08-14 DIAGNOSIS — N2581 Secondary hyperparathyroidism of renal origin: Secondary | ICD-10-CM | POA: Diagnosis not present

## 2020-08-14 DIAGNOSIS — D631 Anemia in chronic kidney disease: Secondary | ICD-10-CM | POA: Diagnosis not present

## 2020-08-14 DIAGNOSIS — D689 Coagulation defect, unspecified: Secondary | ICD-10-CM | POA: Diagnosis not present

## 2020-08-14 DIAGNOSIS — Z23 Encounter for immunization: Secondary | ICD-10-CM | POA: Diagnosis not present

## 2020-08-16 DIAGNOSIS — N2581 Secondary hyperparathyroidism of renal origin: Secondary | ICD-10-CM | POA: Diagnosis not present

## 2020-08-16 DIAGNOSIS — D631 Anemia in chronic kidney disease: Secondary | ICD-10-CM | POA: Diagnosis not present

## 2020-08-16 DIAGNOSIS — Z23 Encounter for immunization: Secondary | ICD-10-CM | POA: Diagnosis not present

## 2020-08-16 DIAGNOSIS — D689 Coagulation defect, unspecified: Secondary | ICD-10-CM | POA: Diagnosis not present

## 2020-08-16 DIAGNOSIS — Z992 Dependence on renal dialysis: Secondary | ICD-10-CM | POA: Diagnosis not present

## 2020-08-16 DIAGNOSIS — N186 End stage renal disease: Secondary | ICD-10-CM | POA: Diagnosis not present

## 2020-08-19 DIAGNOSIS — Z992 Dependence on renal dialysis: Secondary | ICD-10-CM | POA: Diagnosis not present

## 2020-08-19 DIAGNOSIS — Z23 Encounter for immunization: Secondary | ICD-10-CM | POA: Diagnosis not present

## 2020-08-19 DIAGNOSIS — N186 End stage renal disease: Secondary | ICD-10-CM | POA: Diagnosis not present

## 2020-08-19 DIAGNOSIS — D689 Coagulation defect, unspecified: Secondary | ICD-10-CM | POA: Diagnosis not present

## 2020-08-19 DIAGNOSIS — N2581 Secondary hyperparathyroidism of renal origin: Secondary | ICD-10-CM | POA: Diagnosis not present

## 2020-08-19 DIAGNOSIS — D631 Anemia in chronic kidney disease: Secondary | ICD-10-CM | POA: Diagnosis not present

## 2020-08-22 DIAGNOSIS — T8612 Kidney transplant failure: Secondary | ICD-10-CM | POA: Diagnosis not present

## 2020-08-22 DIAGNOSIS — N186 End stage renal disease: Secondary | ICD-10-CM | POA: Diagnosis not present

## 2020-08-22 DIAGNOSIS — N2581 Secondary hyperparathyroidism of renal origin: Secondary | ICD-10-CM | POA: Diagnosis not present

## 2020-08-22 DIAGNOSIS — Z23 Encounter for immunization: Secondary | ICD-10-CM | POA: Diagnosis not present

## 2020-08-22 DIAGNOSIS — D689 Coagulation defect, unspecified: Secondary | ICD-10-CM | POA: Diagnosis not present

## 2020-08-22 DIAGNOSIS — D631 Anemia in chronic kidney disease: Secondary | ICD-10-CM | POA: Diagnosis not present

## 2020-08-22 DIAGNOSIS — Z992 Dependence on renal dialysis: Secondary | ICD-10-CM | POA: Diagnosis not present

## 2020-08-24 DIAGNOSIS — E039 Hypothyroidism, unspecified: Secondary | ICD-10-CM | POA: Diagnosis not present

## 2020-08-24 DIAGNOSIS — Z992 Dependence on renal dialysis: Secondary | ICD-10-CM | POA: Diagnosis not present

## 2020-08-24 DIAGNOSIS — D631 Anemia in chronic kidney disease: Secondary | ICD-10-CM | POA: Diagnosis not present

## 2020-08-24 DIAGNOSIS — N186 End stage renal disease: Secondary | ICD-10-CM | POA: Diagnosis not present

## 2020-08-24 DIAGNOSIS — N2581 Secondary hyperparathyroidism of renal origin: Secondary | ICD-10-CM | POA: Diagnosis not present

## 2020-08-24 DIAGNOSIS — K739 Chronic hepatitis, unspecified: Secondary | ICD-10-CM | POA: Diagnosis not present

## 2020-08-24 DIAGNOSIS — D689 Coagulation defect, unspecified: Secondary | ICD-10-CM | POA: Diagnosis not present

## 2020-08-26 DIAGNOSIS — N186 End stage renal disease: Secondary | ICD-10-CM | POA: Diagnosis not present

## 2020-08-26 DIAGNOSIS — K739 Chronic hepatitis, unspecified: Secondary | ICD-10-CM | POA: Diagnosis not present

## 2020-08-26 DIAGNOSIS — Z992 Dependence on renal dialysis: Secondary | ICD-10-CM | POA: Diagnosis not present

## 2020-08-26 DIAGNOSIS — D689 Coagulation defect, unspecified: Secondary | ICD-10-CM | POA: Diagnosis not present

## 2020-08-26 DIAGNOSIS — N2581 Secondary hyperparathyroidism of renal origin: Secondary | ICD-10-CM | POA: Diagnosis not present

## 2020-08-26 DIAGNOSIS — E039 Hypothyroidism, unspecified: Secondary | ICD-10-CM | POA: Diagnosis not present

## 2020-08-26 DIAGNOSIS — D631 Anemia in chronic kidney disease: Secondary | ICD-10-CM | POA: Diagnosis not present

## 2020-08-28 DIAGNOSIS — H9202 Otalgia, left ear: Secondary | ICD-10-CM | POA: Insufficient documentation

## 2020-08-29 DIAGNOSIS — E039 Hypothyroidism, unspecified: Secondary | ICD-10-CM | POA: Diagnosis not present

## 2020-08-29 DIAGNOSIS — D689 Coagulation defect, unspecified: Secondary | ICD-10-CM | POA: Diagnosis not present

## 2020-08-29 DIAGNOSIS — K739 Chronic hepatitis, unspecified: Secondary | ICD-10-CM | POA: Diagnosis not present

## 2020-08-29 DIAGNOSIS — Z992 Dependence on renal dialysis: Secondary | ICD-10-CM | POA: Diagnosis not present

## 2020-08-29 DIAGNOSIS — N186 End stage renal disease: Secondary | ICD-10-CM | POA: Diagnosis not present

## 2020-08-29 DIAGNOSIS — D631 Anemia in chronic kidney disease: Secondary | ICD-10-CM | POA: Diagnosis not present

## 2020-08-29 DIAGNOSIS — N2581 Secondary hyperparathyroidism of renal origin: Secondary | ICD-10-CM | POA: Diagnosis not present

## 2020-08-30 ENCOUNTER — Other Ambulatory Visit: Payer: Self-pay

## 2020-08-30 ENCOUNTER — Ambulatory Visit
Admission: RE | Admit: 2020-08-30 | Discharge: 2020-08-30 | Disposition: A | Discharge status: Home / Self Care | Payer: Medicare Other | Source: Ambulatory Visit | Attending: Family Medicine | Admitting: Family Medicine

## 2020-08-30 DIAGNOSIS — I671 Cerebral aneurysm, nonruptured: Secondary | ICD-10-CM

## 2020-08-30 DIAGNOSIS — Q283 Other malformations of cerebral vessels: Secondary | ICD-10-CM | POA: Diagnosis not present

## 2020-08-30 DIAGNOSIS — I728 Aneurysm of other specified arteries: Secondary | ICD-10-CM | POA: Diagnosis not present

## 2020-08-31 DIAGNOSIS — D689 Coagulation defect, unspecified: Secondary | ICD-10-CM | POA: Diagnosis not present

## 2020-08-31 DIAGNOSIS — D631 Anemia in chronic kidney disease: Secondary | ICD-10-CM | POA: Diagnosis not present

## 2020-08-31 DIAGNOSIS — K739 Chronic hepatitis, unspecified: Secondary | ICD-10-CM | POA: Diagnosis not present

## 2020-08-31 DIAGNOSIS — N2581 Secondary hyperparathyroidism of renal origin: Secondary | ICD-10-CM | POA: Diagnosis not present

## 2020-08-31 DIAGNOSIS — Z992 Dependence on renal dialysis: Secondary | ICD-10-CM | POA: Diagnosis not present

## 2020-08-31 DIAGNOSIS — E039 Hypothyroidism, unspecified: Secondary | ICD-10-CM | POA: Diagnosis not present

## 2020-08-31 DIAGNOSIS — N186 End stage renal disease: Secondary | ICD-10-CM | POA: Diagnosis not present

## 2020-09-02 DIAGNOSIS — E039 Hypothyroidism, unspecified: Secondary | ICD-10-CM | POA: Diagnosis not present

## 2020-09-02 DIAGNOSIS — Z992 Dependence on renal dialysis: Secondary | ICD-10-CM | POA: Diagnosis not present

## 2020-09-02 DIAGNOSIS — D689 Coagulation defect, unspecified: Secondary | ICD-10-CM | POA: Diagnosis not present

## 2020-09-02 DIAGNOSIS — D631 Anemia in chronic kidney disease: Secondary | ICD-10-CM | POA: Diagnosis not present

## 2020-09-02 DIAGNOSIS — K739 Chronic hepatitis, unspecified: Secondary | ICD-10-CM | POA: Diagnosis not present

## 2020-09-02 DIAGNOSIS — N2581 Secondary hyperparathyroidism of renal origin: Secondary | ICD-10-CM | POA: Diagnosis not present

## 2020-09-02 DIAGNOSIS — N186 End stage renal disease: Secondary | ICD-10-CM | POA: Diagnosis not present

## 2020-09-05 DIAGNOSIS — K739 Chronic hepatitis, unspecified: Secondary | ICD-10-CM | POA: Diagnosis not present

## 2020-09-05 DIAGNOSIS — N186 End stage renal disease: Secondary | ICD-10-CM | POA: Diagnosis not present

## 2020-09-05 DIAGNOSIS — Z992 Dependence on renal dialysis: Secondary | ICD-10-CM | POA: Diagnosis not present

## 2020-09-05 DIAGNOSIS — D689 Coagulation defect, unspecified: Secondary | ICD-10-CM | POA: Diagnosis not present

## 2020-09-05 DIAGNOSIS — E039 Hypothyroidism, unspecified: Secondary | ICD-10-CM | POA: Diagnosis not present

## 2020-09-05 DIAGNOSIS — N2581 Secondary hyperparathyroidism of renal origin: Secondary | ICD-10-CM | POA: Diagnosis not present

## 2020-09-05 DIAGNOSIS — D631 Anemia in chronic kidney disease: Secondary | ICD-10-CM | POA: Diagnosis not present

## 2020-09-07 DIAGNOSIS — D689 Coagulation defect, unspecified: Secondary | ICD-10-CM | POA: Diagnosis not present

## 2020-09-07 DIAGNOSIS — N186 End stage renal disease: Secondary | ICD-10-CM | POA: Diagnosis not present

## 2020-09-07 DIAGNOSIS — N2581 Secondary hyperparathyroidism of renal origin: Secondary | ICD-10-CM | POA: Diagnosis not present

## 2020-09-07 DIAGNOSIS — D631 Anemia in chronic kidney disease: Secondary | ICD-10-CM | POA: Diagnosis not present

## 2020-09-07 DIAGNOSIS — K739 Chronic hepatitis, unspecified: Secondary | ICD-10-CM | POA: Diagnosis not present

## 2020-09-07 DIAGNOSIS — E039 Hypothyroidism, unspecified: Secondary | ICD-10-CM | POA: Diagnosis not present

## 2020-09-07 DIAGNOSIS — Z992 Dependence on renal dialysis: Secondary | ICD-10-CM | POA: Diagnosis not present

## 2020-09-09 DIAGNOSIS — D631 Anemia in chronic kidney disease: Secondary | ICD-10-CM | POA: Diagnosis not present

## 2020-09-09 DIAGNOSIS — N2581 Secondary hyperparathyroidism of renal origin: Secondary | ICD-10-CM | POA: Diagnosis not present

## 2020-09-09 DIAGNOSIS — D689 Coagulation defect, unspecified: Secondary | ICD-10-CM | POA: Diagnosis not present

## 2020-09-09 DIAGNOSIS — K739 Chronic hepatitis, unspecified: Secondary | ICD-10-CM | POA: Diagnosis not present

## 2020-09-09 DIAGNOSIS — Z992 Dependence on renal dialysis: Secondary | ICD-10-CM | POA: Diagnosis not present

## 2020-09-09 DIAGNOSIS — E039 Hypothyroidism, unspecified: Secondary | ICD-10-CM | POA: Diagnosis not present

## 2020-09-09 DIAGNOSIS — N186 End stage renal disease: Secondary | ICD-10-CM | POA: Diagnosis not present

## 2020-09-12 DIAGNOSIS — N186 End stage renal disease: Secondary | ICD-10-CM | POA: Diagnosis not present

## 2020-09-12 DIAGNOSIS — E039 Hypothyroidism, unspecified: Secondary | ICD-10-CM | POA: Diagnosis not present

## 2020-09-12 DIAGNOSIS — Z992 Dependence on renal dialysis: Secondary | ICD-10-CM | POA: Diagnosis not present

## 2020-09-12 DIAGNOSIS — K739 Chronic hepatitis, unspecified: Secondary | ICD-10-CM | POA: Diagnosis not present

## 2020-09-12 DIAGNOSIS — D689 Coagulation defect, unspecified: Secondary | ICD-10-CM | POA: Diagnosis not present

## 2020-09-12 DIAGNOSIS — D631 Anemia in chronic kidney disease: Secondary | ICD-10-CM | POA: Diagnosis not present

## 2020-09-12 DIAGNOSIS — N2581 Secondary hyperparathyroidism of renal origin: Secondary | ICD-10-CM | POA: Diagnosis not present

## 2020-09-13 ENCOUNTER — Other Ambulatory Visit: Payer: Self-pay | Admitting: Physician Assistant

## 2020-09-13 ENCOUNTER — Other Ambulatory Visit (HOSPITAL_COMMUNITY)
Admission: RE | Admit: 2020-09-13 | Discharge: 2020-09-13 | Disposition: A | Discharge status: Home / Self Care | Payer: Medicare Other | Source: Ambulatory Visit | Attending: Physician Assistant | Admitting: Physician Assistant

## 2020-09-13 DIAGNOSIS — Z1389 Encounter for screening for other disorder: Secondary | ICD-10-CM | POA: Diagnosis not present

## 2020-09-13 DIAGNOSIS — R252 Cramp and spasm: Secondary | ICD-10-CM | POA: Diagnosis not present

## 2020-09-13 DIAGNOSIS — Z0000 Encounter for general adult medical examination without abnormal findings: Secondary | ICD-10-CM | POA: Diagnosis not present

## 2020-09-13 DIAGNOSIS — Z124 Encounter for screening for malignant neoplasm of cervix: Secondary | ICD-10-CM | POA: Insufficient documentation

## 2020-09-13 DIAGNOSIS — Z1151 Encounter for screening for human papillomavirus (HPV): Secondary | ICD-10-CM | POA: Insufficient documentation

## 2020-09-13 DIAGNOSIS — I10 Essential (primary) hypertension: Secondary | ICD-10-CM | POA: Diagnosis not present

## 2020-09-13 DIAGNOSIS — N186 End stage renal disease: Secondary | ICD-10-CM | POA: Diagnosis not present

## 2020-09-13 DIAGNOSIS — R7989 Other specified abnormal findings of blood chemistry: Secondary | ICD-10-CM | POA: Diagnosis not present

## 2020-09-14 DIAGNOSIS — N186 End stage renal disease: Secondary | ICD-10-CM | POA: Diagnosis not present

## 2020-09-14 DIAGNOSIS — Z992 Dependence on renal dialysis: Secondary | ICD-10-CM | POA: Diagnosis not present

## 2020-09-14 DIAGNOSIS — D631 Anemia in chronic kidney disease: Secondary | ICD-10-CM | POA: Diagnosis not present

## 2020-09-14 DIAGNOSIS — E039 Hypothyroidism, unspecified: Secondary | ICD-10-CM | POA: Diagnosis not present

## 2020-09-14 DIAGNOSIS — D689 Coagulation defect, unspecified: Secondary | ICD-10-CM | POA: Diagnosis not present

## 2020-09-14 DIAGNOSIS — K739 Chronic hepatitis, unspecified: Secondary | ICD-10-CM | POA: Diagnosis not present

## 2020-09-14 DIAGNOSIS — N2581 Secondary hyperparathyroidism of renal origin: Secondary | ICD-10-CM | POA: Diagnosis not present

## 2020-09-17 DIAGNOSIS — Z992 Dependence on renal dialysis: Secondary | ICD-10-CM | POA: Diagnosis not present

## 2020-09-17 DIAGNOSIS — E039 Hypothyroidism, unspecified: Secondary | ICD-10-CM | POA: Diagnosis not present

## 2020-09-17 DIAGNOSIS — N2581 Secondary hyperparathyroidism of renal origin: Secondary | ICD-10-CM | POA: Diagnosis not present

## 2020-09-17 DIAGNOSIS — N186 End stage renal disease: Secondary | ICD-10-CM | POA: Diagnosis not present

## 2020-09-17 DIAGNOSIS — D631 Anemia in chronic kidney disease: Secondary | ICD-10-CM | POA: Diagnosis not present

## 2020-09-17 DIAGNOSIS — D689 Coagulation defect, unspecified: Secondary | ICD-10-CM | POA: Diagnosis not present

## 2020-09-17 DIAGNOSIS — K739 Chronic hepatitis, unspecified: Secondary | ICD-10-CM | POA: Diagnosis not present

## 2020-09-19 DIAGNOSIS — D631 Anemia in chronic kidney disease: Secondary | ICD-10-CM | POA: Diagnosis not present

## 2020-09-19 DIAGNOSIS — D689 Coagulation defect, unspecified: Secondary | ICD-10-CM | POA: Diagnosis not present

## 2020-09-19 DIAGNOSIS — Z992 Dependence on renal dialysis: Secondary | ICD-10-CM | POA: Diagnosis not present

## 2020-09-19 DIAGNOSIS — E039 Hypothyroidism, unspecified: Secondary | ICD-10-CM | POA: Diagnosis not present

## 2020-09-19 DIAGNOSIS — N2581 Secondary hyperparathyroidism of renal origin: Secondary | ICD-10-CM | POA: Diagnosis not present

## 2020-09-19 DIAGNOSIS — K739 Chronic hepatitis, unspecified: Secondary | ICD-10-CM | POA: Diagnosis not present

## 2020-09-19 DIAGNOSIS — N186 End stage renal disease: Secondary | ICD-10-CM | POA: Diagnosis not present

## 2020-09-19 LAB — CYTOLOGY - PAP
Comment: NEGATIVE
Diagnosis: NEGATIVE
High risk HPV: NEGATIVE

## 2020-09-21 DIAGNOSIS — D689 Coagulation defect, unspecified: Secondary | ICD-10-CM | POA: Diagnosis not present

## 2020-09-21 DIAGNOSIS — D631 Anemia in chronic kidney disease: Secondary | ICD-10-CM | POA: Diagnosis not present

## 2020-09-21 DIAGNOSIS — N186 End stage renal disease: Secondary | ICD-10-CM | POA: Diagnosis not present

## 2020-09-21 DIAGNOSIS — E039 Hypothyroidism, unspecified: Secondary | ICD-10-CM | POA: Diagnosis not present

## 2020-09-21 DIAGNOSIS — N2581 Secondary hyperparathyroidism of renal origin: Secondary | ICD-10-CM | POA: Diagnosis not present

## 2020-09-21 DIAGNOSIS — Z992 Dependence on renal dialysis: Secondary | ICD-10-CM | POA: Diagnosis not present

## 2020-09-21 DIAGNOSIS — K739 Chronic hepatitis, unspecified: Secondary | ICD-10-CM | POA: Diagnosis not present

## 2020-09-22 DIAGNOSIS — N186 End stage renal disease: Secondary | ICD-10-CM | POA: Diagnosis not present

## 2020-09-22 DIAGNOSIS — Z992 Dependence on renal dialysis: Secondary | ICD-10-CM | POA: Diagnosis not present

## 2020-09-22 DIAGNOSIS — T8612 Kidney transplant failure: Secondary | ICD-10-CM | POA: Diagnosis not present

## 2020-09-24 DIAGNOSIS — K739 Chronic hepatitis, unspecified: Secondary | ICD-10-CM | POA: Diagnosis not present

## 2020-09-24 DIAGNOSIS — D689 Coagulation defect, unspecified: Secondary | ICD-10-CM | POA: Diagnosis not present

## 2020-09-24 DIAGNOSIS — E1129 Type 2 diabetes mellitus with other diabetic kidney complication: Secondary | ICD-10-CM | POA: Diagnosis not present

## 2020-09-24 DIAGNOSIS — N2581 Secondary hyperparathyroidism of renal origin: Secondary | ICD-10-CM | POA: Diagnosis not present

## 2020-09-24 DIAGNOSIS — E039 Hypothyroidism, unspecified: Secondary | ICD-10-CM | POA: Diagnosis not present

## 2020-09-24 DIAGNOSIS — D631 Anemia in chronic kidney disease: Secondary | ICD-10-CM | POA: Diagnosis not present

## 2020-09-24 DIAGNOSIS — Z992 Dependence on renal dialysis: Secondary | ICD-10-CM | POA: Diagnosis not present

## 2020-09-24 DIAGNOSIS — N186 End stage renal disease: Secondary | ICD-10-CM | POA: Diagnosis not present

## 2020-09-26 DIAGNOSIS — E1129 Type 2 diabetes mellitus with other diabetic kidney complication: Secondary | ICD-10-CM | POA: Diagnosis not present

## 2020-09-26 DIAGNOSIS — N186 End stage renal disease: Secondary | ICD-10-CM | POA: Diagnosis not present

## 2020-09-26 DIAGNOSIS — K739 Chronic hepatitis, unspecified: Secondary | ICD-10-CM | POA: Diagnosis not present

## 2020-09-26 DIAGNOSIS — Z992 Dependence on renal dialysis: Secondary | ICD-10-CM | POA: Diagnosis not present

## 2020-09-26 DIAGNOSIS — N2581 Secondary hyperparathyroidism of renal origin: Secondary | ICD-10-CM | POA: Diagnosis not present

## 2020-09-26 DIAGNOSIS — D631 Anemia in chronic kidney disease: Secondary | ICD-10-CM | POA: Diagnosis not present

## 2020-09-26 DIAGNOSIS — E039 Hypothyroidism, unspecified: Secondary | ICD-10-CM | POA: Diagnosis not present

## 2020-09-26 DIAGNOSIS — D689 Coagulation defect, unspecified: Secondary | ICD-10-CM | POA: Diagnosis not present

## 2020-09-30 DIAGNOSIS — K739 Chronic hepatitis, unspecified: Secondary | ICD-10-CM | POA: Diagnosis not present

## 2020-09-30 DIAGNOSIS — D631 Anemia in chronic kidney disease: Secondary | ICD-10-CM | POA: Diagnosis not present

## 2020-09-30 DIAGNOSIS — E039 Hypothyroidism, unspecified: Secondary | ICD-10-CM | POA: Diagnosis not present

## 2020-09-30 DIAGNOSIS — N186 End stage renal disease: Secondary | ICD-10-CM | POA: Diagnosis not present

## 2020-09-30 DIAGNOSIS — N2581 Secondary hyperparathyroidism of renal origin: Secondary | ICD-10-CM | POA: Diagnosis not present

## 2020-09-30 DIAGNOSIS — E1129 Type 2 diabetes mellitus with other diabetic kidney complication: Secondary | ICD-10-CM | POA: Diagnosis not present

## 2020-09-30 DIAGNOSIS — Z992 Dependence on renal dialysis: Secondary | ICD-10-CM | POA: Diagnosis not present

## 2020-09-30 DIAGNOSIS — D689 Coagulation defect, unspecified: Secondary | ICD-10-CM | POA: Diagnosis not present

## 2020-10-03 DIAGNOSIS — E039 Hypothyroidism, unspecified: Secondary | ICD-10-CM | POA: Diagnosis not present

## 2020-10-03 DIAGNOSIS — D631 Anemia in chronic kidney disease: Secondary | ICD-10-CM | POA: Diagnosis not present

## 2020-10-03 DIAGNOSIS — N2581 Secondary hyperparathyroidism of renal origin: Secondary | ICD-10-CM | POA: Diagnosis not present

## 2020-10-03 DIAGNOSIS — D689 Coagulation defect, unspecified: Secondary | ICD-10-CM | POA: Diagnosis not present

## 2020-10-03 DIAGNOSIS — Z992 Dependence on renal dialysis: Secondary | ICD-10-CM | POA: Diagnosis not present

## 2020-10-03 DIAGNOSIS — N186 End stage renal disease: Secondary | ICD-10-CM | POA: Diagnosis not present

## 2020-10-03 DIAGNOSIS — K739 Chronic hepatitis, unspecified: Secondary | ICD-10-CM | POA: Diagnosis not present

## 2020-10-03 DIAGNOSIS — E1129 Type 2 diabetes mellitus with other diabetic kidney complication: Secondary | ICD-10-CM | POA: Diagnosis not present

## 2020-10-05 DIAGNOSIS — D689 Coagulation defect, unspecified: Secondary | ICD-10-CM | POA: Diagnosis not present

## 2020-10-05 DIAGNOSIS — E1129 Type 2 diabetes mellitus with other diabetic kidney complication: Secondary | ICD-10-CM | POA: Diagnosis not present

## 2020-10-05 DIAGNOSIS — Z992 Dependence on renal dialysis: Secondary | ICD-10-CM | POA: Diagnosis not present

## 2020-10-05 DIAGNOSIS — D631 Anemia in chronic kidney disease: Secondary | ICD-10-CM | POA: Diagnosis not present

## 2020-10-05 DIAGNOSIS — N2581 Secondary hyperparathyroidism of renal origin: Secondary | ICD-10-CM | POA: Diagnosis not present

## 2020-10-05 DIAGNOSIS — K739 Chronic hepatitis, unspecified: Secondary | ICD-10-CM | POA: Diagnosis not present

## 2020-10-05 DIAGNOSIS — E039 Hypothyroidism, unspecified: Secondary | ICD-10-CM | POA: Diagnosis not present

## 2020-10-05 DIAGNOSIS — N186 End stage renal disease: Secondary | ICD-10-CM | POA: Diagnosis not present

## 2020-10-07 DIAGNOSIS — D689 Coagulation defect, unspecified: Secondary | ICD-10-CM | POA: Diagnosis not present

## 2020-10-07 DIAGNOSIS — K739 Chronic hepatitis, unspecified: Secondary | ICD-10-CM | POA: Diagnosis not present

## 2020-10-07 DIAGNOSIS — D631 Anemia in chronic kidney disease: Secondary | ICD-10-CM | POA: Diagnosis not present

## 2020-10-07 DIAGNOSIS — E1129 Type 2 diabetes mellitus with other diabetic kidney complication: Secondary | ICD-10-CM | POA: Diagnosis not present

## 2020-10-07 DIAGNOSIS — E039 Hypothyroidism, unspecified: Secondary | ICD-10-CM | POA: Diagnosis not present

## 2020-10-07 DIAGNOSIS — N2581 Secondary hyperparathyroidism of renal origin: Secondary | ICD-10-CM | POA: Diagnosis not present

## 2020-10-07 DIAGNOSIS — N186 End stage renal disease: Secondary | ICD-10-CM | POA: Diagnosis not present

## 2020-10-07 DIAGNOSIS — Z992 Dependence on renal dialysis: Secondary | ICD-10-CM | POA: Diagnosis not present

## 2020-10-10 DIAGNOSIS — N2581 Secondary hyperparathyroidism of renal origin: Secondary | ICD-10-CM | POA: Diagnosis not present

## 2020-10-10 DIAGNOSIS — Z992 Dependence on renal dialysis: Secondary | ICD-10-CM | POA: Diagnosis not present

## 2020-10-10 DIAGNOSIS — E1129 Type 2 diabetes mellitus with other diabetic kidney complication: Secondary | ICD-10-CM | POA: Diagnosis not present

## 2020-10-10 DIAGNOSIS — N186 End stage renal disease: Secondary | ICD-10-CM | POA: Diagnosis not present

## 2020-10-10 DIAGNOSIS — E039 Hypothyroidism, unspecified: Secondary | ICD-10-CM | POA: Diagnosis not present

## 2020-10-10 DIAGNOSIS — D631 Anemia in chronic kidney disease: Secondary | ICD-10-CM | POA: Diagnosis not present

## 2020-10-10 DIAGNOSIS — D689 Coagulation defect, unspecified: Secondary | ICD-10-CM | POA: Diagnosis not present

## 2020-10-10 DIAGNOSIS — K739 Chronic hepatitis, unspecified: Secondary | ICD-10-CM | POA: Diagnosis not present

## 2020-10-12 DIAGNOSIS — D689 Coagulation defect, unspecified: Secondary | ICD-10-CM | POA: Diagnosis not present

## 2020-10-12 DIAGNOSIS — E1129 Type 2 diabetes mellitus with other diabetic kidney complication: Secondary | ICD-10-CM | POA: Diagnosis not present

## 2020-10-12 DIAGNOSIS — N2581 Secondary hyperparathyroidism of renal origin: Secondary | ICD-10-CM | POA: Diagnosis not present

## 2020-10-12 DIAGNOSIS — K739 Chronic hepatitis, unspecified: Secondary | ICD-10-CM | POA: Diagnosis not present

## 2020-10-12 DIAGNOSIS — Z992 Dependence on renal dialysis: Secondary | ICD-10-CM | POA: Diagnosis not present

## 2020-10-12 DIAGNOSIS — D631 Anemia in chronic kidney disease: Secondary | ICD-10-CM | POA: Diagnosis not present

## 2020-10-12 DIAGNOSIS — N186 End stage renal disease: Secondary | ICD-10-CM | POA: Diagnosis not present

## 2020-10-12 DIAGNOSIS — E039 Hypothyroidism, unspecified: Secondary | ICD-10-CM | POA: Diagnosis not present

## 2020-10-13 DIAGNOSIS — R053 Chronic cough: Secondary | ICD-10-CM | POA: Diagnosis not present

## 2020-10-13 DIAGNOSIS — D3A090 Benign carcinoid tumor of the bronchus and lung: Secondary | ICD-10-CM | POA: Diagnosis not present

## 2020-10-14 DIAGNOSIS — K739 Chronic hepatitis, unspecified: Secondary | ICD-10-CM | POA: Diagnosis not present

## 2020-10-14 DIAGNOSIS — D631 Anemia in chronic kidney disease: Secondary | ICD-10-CM | POA: Diagnosis not present

## 2020-10-14 DIAGNOSIS — E039 Hypothyroidism, unspecified: Secondary | ICD-10-CM | POA: Diagnosis not present

## 2020-10-14 DIAGNOSIS — D689 Coagulation defect, unspecified: Secondary | ICD-10-CM | POA: Diagnosis not present

## 2020-10-14 DIAGNOSIS — E1129 Type 2 diabetes mellitus with other diabetic kidney complication: Secondary | ICD-10-CM | POA: Diagnosis not present

## 2020-10-14 DIAGNOSIS — Z992 Dependence on renal dialysis: Secondary | ICD-10-CM | POA: Diagnosis not present

## 2020-10-14 DIAGNOSIS — N2581 Secondary hyperparathyroidism of renal origin: Secondary | ICD-10-CM | POA: Diagnosis not present

## 2020-10-14 DIAGNOSIS — N186 End stage renal disease: Secondary | ICD-10-CM | POA: Diagnosis not present

## 2020-10-17 DIAGNOSIS — K739 Chronic hepatitis, unspecified: Secondary | ICD-10-CM | POA: Diagnosis not present

## 2020-10-17 DIAGNOSIS — D631 Anemia in chronic kidney disease: Secondary | ICD-10-CM | POA: Diagnosis not present

## 2020-10-17 DIAGNOSIS — N186 End stage renal disease: Secondary | ICD-10-CM | POA: Diagnosis not present

## 2020-10-17 DIAGNOSIS — Z992 Dependence on renal dialysis: Secondary | ICD-10-CM | POA: Diagnosis not present

## 2020-10-17 DIAGNOSIS — D689 Coagulation defect, unspecified: Secondary | ICD-10-CM | POA: Diagnosis not present

## 2020-10-17 DIAGNOSIS — E1129 Type 2 diabetes mellitus with other diabetic kidney complication: Secondary | ICD-10-CM | POA: Diagnosis not present

## 2020-10-17 DIAGNOSIS — E039 Hypothyroidism, unspecified: Secondary | ICD-10-CM | POA: Diagnosis not present

## 2020-10-17 DIAGNOSIS — N2581 Secondary hyperparathyroidism of renal origin: Secondary | ICD-10-CM | POA: Diagnosis not present

## 2020-10-19 DIAGNOSIS — N186 End stage renal disease: Secondary | ICD-10-CM | POA: Diagnosis not present

## 2020-10-19 DIAGNOSIS — N2581 Secondary hyperparathyroidism of renal origin: Secondary | ICD-10-CM | POA: Diagnosis not present

## 2020-10-19 DIAGNOSIS — E039 Hypothyroidism, unspecified: Secondary | ICD-10-CM | POA: Diagnosis not present

## 2020-10-19 DIAGNOSIS — E1129 Type 2 diabetes mellitus with other diabetic kidney complication: Secondary | ICD-10-CM | POA: Diagnosis not present

## 2020-10-19 DIAGNOSIS — K739 Chronic hepatitis, unspecified: Secondary | ICD-10-CM | POA: Diagnosis not present

## 2020-10-19 DIAGNOSIS — D631 Anemia in chronic kidney disease: Secondary | ICD-10-CM | POA: Diagnosis not present

## 2020-10-19 DIAGNOSIS — Z992 Dependence on renal dialysis: Secondary | ICD-10-CM | POA: Diagnosis not present

## 2020-10-19 DIAGNOSIS — D689 Coagulation defect, unspecified: Secondary | ICD-10-CM | POA: Diagnosis not present

## 2020-10-20 DIAGNOSIS — I871 Compression of vein: Secondary | ICD-10-CM | POA: Diagnosis not present

## 2020-10-20 DIAGNOSIS — T82858A Stenosis of vascular prosthetic devices, implants and grafts, initial encounter: Secondary | ICD-10-CM | POA: Diagnosis not present

## 2020-10-20 DIAGNOSIS — Z992 Dependence on renal dialysis: Secondary | ICD-10-CM | POA: Diagnosis not present

## 2020-10-20 DIAGNOSIS — N186 End stage renal disease: Secondary | ICD-10-CM | POA: Diagnosis not present

## 2020-10-21 DIAGNOSIS — D689 Coagulation defect, unspecified: Secondary | ICD-10-CM | POA: Diagnosis not present

## 2020-10-21 DIAGNOSIS — N186 End stage renal disease: Secondary | ICD-10-CM | POA: Diagnosis not present

## 2020-10-21 DIAGNOSIS — K739 Chronic hepatitis, unspecified: Secondary | ICD-10-CM | POA: Diagnosis not present

## 2020-10-21 DIAGNOSIS — D631 Anemia in chronic kidney disease: Secondary | ICD-10-CM | POA: Diagnosis not present

## 2020-10-21 DIAGNOSIS — N2581 Secondary hyperparathyroidism of renal origin: Secondary | ICD-10-CM | POA: Diagnosis not present

## 2020-10-21 DIAGNOSIS — E039 Hypothyroidism, unspecified: Secondary | ICD-10-CM | POA: Diagnosis not present

## 2020-10-21 DIAGNOSIS — E1129 Type 2 diabetes mellitus with other diabetic kidney complication: Secondary | ICD-10-CM | POA: Diagnosis not present

## 2020-10-21 DIAGNOSIS — Z992 Dependence on renal dialysis: Secondary | ICD-10-CM | POA: Diagnosis not present

## 2020-10-23 DIAGNOSIS — N186 End stage renal disease: Secondary | ICD-10-CM | POA: Diagnosis not present

## 2020-10-23 DIAGNOSIS — T8612 Kidney transplant failure: Secondary | ICD-10-CM | POA: Diagnosis not present

## 2020-10-23 DIAGNOSIS — Z992 Dependence on renal dialysis: Secondary | ICD-10-CM | POA: Diagnosis not present

## 2020-10-24 DIAGNOSIS — N186 End stage renal disease: Secondary | ICD-10-CM | POA: Diagnosis not present

## 2020-10-24 DIAGNOSIS — Z992 Dependence on renal dialysis: Secondary | ICD-10-CM | POA: Diagnosis not present

## 2020-10-24 DIAGNOSIS — E039 Hypothyroidism, unspecified: Secondary | ICD-10-CM | POA: Diagnosis not present

## 2020-10-24 DIAGNOSIS — N2581 Secondary hyperparathyroidism of renal origin: Secondary | ICD-10-CM | POA: Diagnosis not present

## 2020-10-24 DIAGNOSIS — D689 Coagulation defect, unspecified: Secondary | ICD-10-CM | POA: Diagnosis not present

## 2020-10-24 DIAGNOSIS — K739 Chronic hepatitis, unspecified: Secondary | ICD-10-CM | POA: Diagnosis not present

## 2020-10-24 DIAGNOSIS — D631 Anemia in chronic kidney disease: Secondary | ICD-10-CM | POA: Diagnosis not present

## 2020-10-26 DIAGNOSIS — N2581 Secondary hyperparathyroidism of renal origin: Secondary | ICD-10-CM | POA: Diagnosis not present

## 2020-10-26 DIAGNOSIS — Z992 Dependence on renal dialysis: Secondary | ICD-10-CM | POA: Diagnosis not present

## 2020-10-26 DIAGNOSIS — D631 Anemia in chronic kidney disease: Secondary | ICD-10-CM | POA: Diagnosis not present

## 2020-10-26 DIAGNOSIS — D689 Coagulation defect, unspecified: Secondary | ICD-10-CM | POA: Diagnosis not present

## 2020-10-26 DIAGNOSIS — N186 End stage renal disease: Secondary | ICD-10-CM | POA: Diagnosis not present

## 2020-10-26 DIAGNOSIS — K739 Chronic hepatitis, unspecified: Secondary | ICD-10-CM | POA: Diagnosis not present

## 2020-10-26 DIAGNOSIS — E039 Hypothyroidism, unspecified: Secondary | ICD-10-CM | POA: Diagnosis not present

## 2020-10-28 DIAGNOSIS — N2581 Secondary hyperparathyroidism of renal origin: Secondary | ICD-10-CM | POA: Diagnosis not present

## 2020-10-28 DIAGNOSIS — D631 Anemia in chronic kidney disease: Secondary | ICD-10-CM | POA: Diagnosis not present

## 2020-10-28 DIAGNOSIS — N186 End stage renal disease: Secondary | ICD-10-CM | POA: Diagnosis not present

## 2020-10-28 DIAGNOSIS — D689 Coagulation defect, unspecified: Secondary | ICD-10-CM | POA: Diagnosis not present

## 2020-10-28 DIAGNOSIS — Z992 Dependence on renal dialysis: Secondary | ICD-10-CM | POA: Diagnosis not present

## 2020-10-28 DIAGNOSIS — K739 Chronic hepatitis, unspecified: Secondary | ICD-10-CM | POA: Diagnosis not present

## 2020-10-28 DIAGNOSIS — E039 Hypothyroidism, unspecified: Secondary | ICD-10-CM | POA: Diagnosis not present

## 2020-10-31 DIAGNOSIS — J45909 Unspecified asthma, uncomplicated: Secondary | ICD-10-CM | POA: Diagnosis not present

## 2020-10-31 DIAGNOSIS — E039 Hypothyroidism, unspecified: Secondary | ICD-10-CM | POA: Diagnosis not present

## 2020-10-31 DIAGNOSIS — I10 Essential (primary) hypertension: Secondary | ICD-10-CM | POA: Diagnosis not present

## 2020-10-31 DIAGNOSIS — Z992 Dependence on renal dialysis: Secondary | ICD-10-CM | POA: Diagnosis not present

## 2020-10-31 DIAGNOSIS — N2581 Secondary hyperparathyroidism of renal origin: Secondary | ICD-10-CM | POA: Diagnosis not present

## 2020-10-31 DIAGNOSIS — D689 Coagulation defect, unspecified: Secondary | ICD-10-CM | POA: Diagnosis not present

## 2020-10-31 DIAGNOSIS — N186 End stage renal disease: Secondary | ICD-10-CM | POA: Diagnosis not present

## 2020-10-31 DIAGNOSIS — D631 Anemia in chronic kidney disease: Secondary | ICD-10-CM | POA: Diagnosis not present

## 2020-10-31 DIAGNOSIS — K739 Chronic hepatitis, unspecified: Secondary | ICD-10-CM | POA: Diagnosis not present

## 2020-11-02 DIAGNOSIS — D689 Coagulation defect, unspecified: Secondary | ICD-10-CM | POA: Diagnosis not present

## 2020-11-02 DIAGNOSIS — D631 Anemia in chronic kidney disease: Secondary | ICD-10-CM | POA: Diagnosis not present

## 2020-11-02 DIAGNOSIS — N186 End stage renal disease: Secondary | ICD-10-CM | POA: Diagnosis not present

## 2020-11-02 DIAGNOSIS — N2581 Secondary hyperparathyroidism of renal origin: Secondary | ICD-10-CM | POA: Diagnosis not present

## 2020-11-02 DIAGNOSIS — K739 Chronic hepatitis, unspecified: Secondary | ICD-10-CM | POA: Diagnosis not present

## 2020-11-02 DIAGNOSIS — Z992 Dependence on renal dialysis: Secondary | ICD-10-CM | POA: Diagnosis not present

## 2020-11-02 DIAGNOSIS — E039 Hypothyroidism, unspecified: Secondary | ICD-10-CM | POA: Diagnosis not present

## 2020-11-03 DIAGNOSIS — M2550 Pain in unspecified joint: Secondary | ICD-10-CM | POA: Diagnosis not present

## 2020-11-03 DIAGNOSIS — M0579 Rheumatoid arthritis with rheumatoid factor of multiple sites without organ or systems involvement: Secondary | ICD-10-CM | POA: Diagnosis not present

## 2020-11-03 DIAGNOSIS — N186 End stage renal disease: Secondary | ICD-10-CM | POA: Diagnosis not present

## 2020-11-03 DIAGNOSIS — A319 Mycobacterial infection, unspecified: Secondary | ICD-10-CM | POA: Diagnosis not present

## 2020-11-03 IMAGING — CR DG HIP (WITH OR WITHOUT PELVIS) 2-3V LEFT
3 series · 3 of 3 positions shown · non-contrast
Comparison: None.

CLINICAL DATA: Left hip pain due to a fall today.

EXAM:
DG HIP (WITH OR WITHOUT PELVIS) 2-3V LEFT

[hip ap]
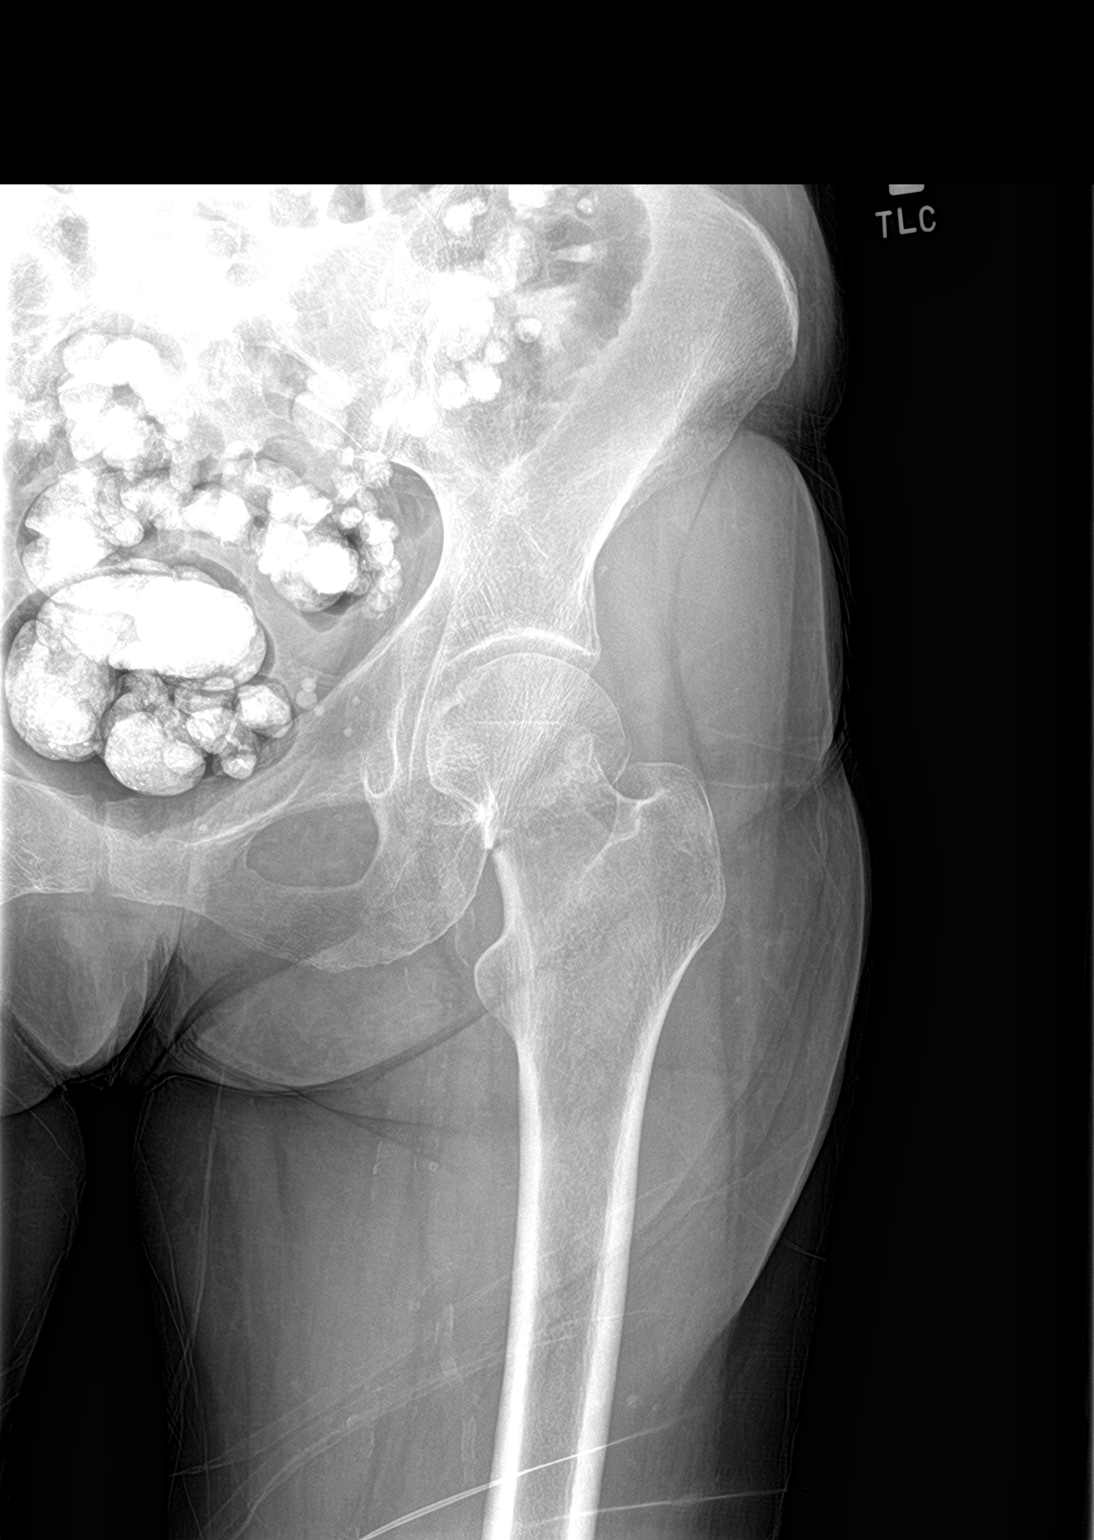

[hip lat]
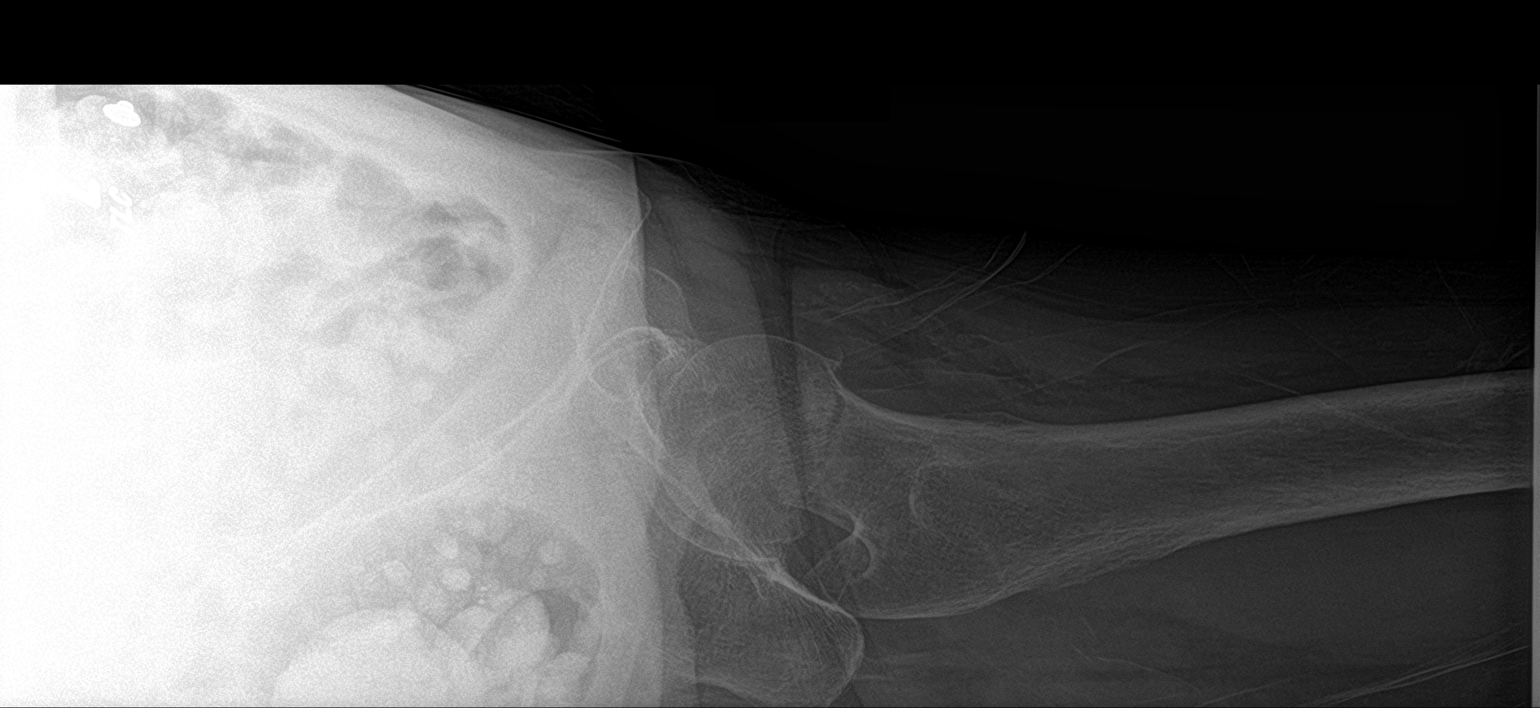

[pelvis ap]
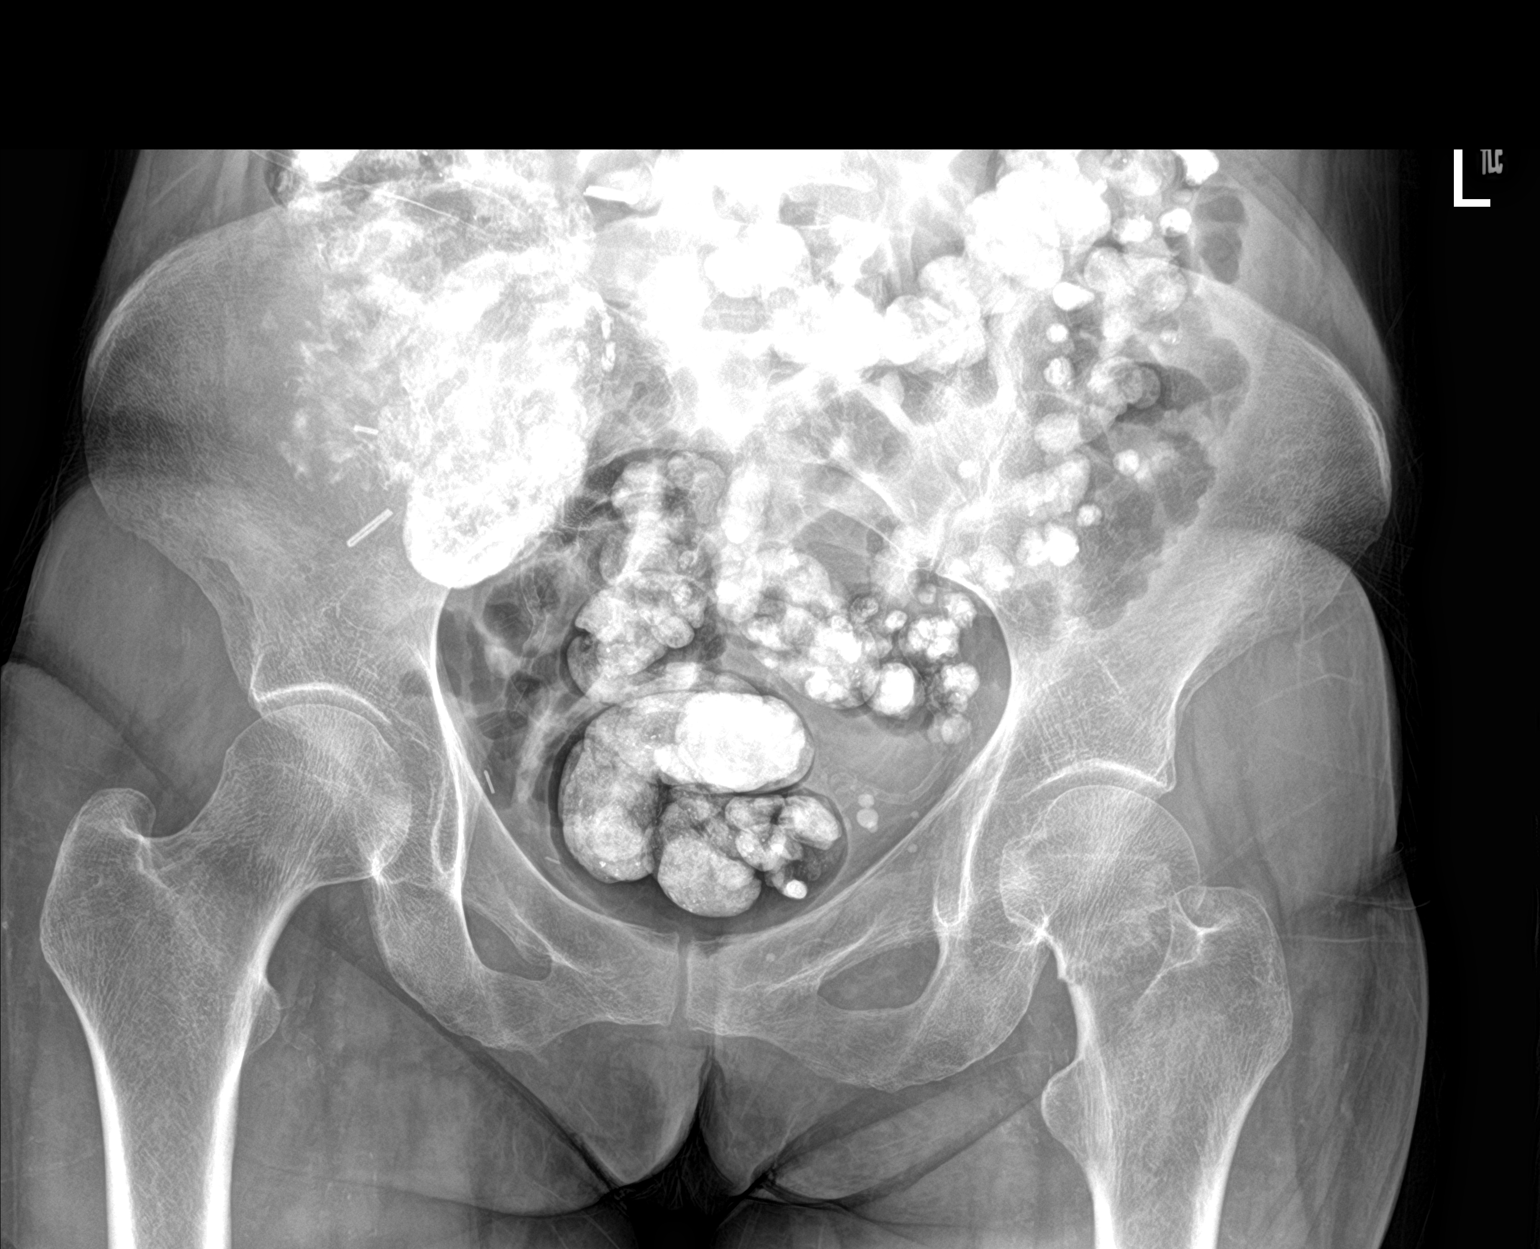

[3 of 3 positions shown; findings below may reference images not displayed]

FINDINGS: The patient has an acute subcapital fracture of the left hip. No
other acute bony or joint abnormality. Contrast material in the
colon is noted. Diverticulosis scratch the diverticular disease is
seen.
IMPRESSION: Acute subcapital fracture left hip.

## 2020-11-04 DIAGNOSIS — D689 Coagulation defect, unspecified: Secondary | ICD-10-CM | POA: Diagnosis not present

## 2020-11-04 DIAGNOSIS — N2581 Secondary hyperparathyroidism of renal origin: Secondary | ICD-10-CM | POA: Diagnosis not present

## 2020-11-04 DIAGNOSIS — D631 Anemia in chronic kidney disease: Secondary | ICD-10-CM | POA: Diagnosis not present

## 2020-11-04 DIAGNOSIS — Z992 Dependence on renal dialysis: Secondary | ICD-10-CM | POA: Diagnosis not present

## 2020-11-04 DIAGNOSIS — N186 End stage renal disease: Secondary | ICD-10-CM | POA: Diagnosis not present

## 2020-11-04 DIAGNOSIS — E039 Hypothyroidism, unspecified: Secondary | ICD-10-CM | POA: Diagnosis not present

## 2020-11-04 DIAGNOSIS — K739 Chronic hepatitis, unspecified: Secondary | ICD-10-CM | POA: Diagnosis not present

## 2020-11-07 DIAGNOSIS — Z992 Dependence on renal dialysis: Secondary | ICD-10-CM | POA: Diagnosis not present

## 2020-11-07 DIAGNOSIS — N186 End stage renal disease: Secondary | ICD-10-CM | POA: Diagnosis not present

## 2020-11-07 DIAGNOSIS — D689 Coagulation defect, unspecified: Secondary | ICD-10-CM | POA: Diagnosis not present

## 2020-11-07 DIAGNOSIS — K739 Chronic hepatitis, unspecified: Secondary | ICD-10-CM | POA: Diagnosis not present

## 2020-11-07 DIAGNOSIS — D631 Anemia in chronic kidney disease: Secondary | ICD-10-CM | POA: Diagnosis not present

## 2020-11-07 DIAGNOSIS — E039 Hypothyroidism, unspecified: Secondary | ICD-10-CM | POA: Diagnosis not present

## 2020-11-07 DIAGNOSIS — N2581 Secondary hyperparathyroidism of renal origin: Secondary | ICD-10-CM | POA: Diagnosis not present

## 2020-11-09 DIAGNOSIS — N2581 Secondary hyperparathyroidism of renal origin: Secondary | ICD-10-CM | POA: Diagnosis not present

## 2020-11-09 DIAGNOSIS — E039 Hypothyroidism, unspecified: Secondary | ICD-10-CM | POA: Diagnosis not present

## 2020-11-09 DIAGNOSIS — K739 Chronic hepatitis, unspecified: Secondary | ICD-10-CM | POA: Diagnosis not present

## 2020-11-09 DIAGNOSIS — D631 Anemia in chronic kidney disease: Secondary | ICD-10-CM | POA: Diagnosis not present

## 2020-11-09 DIAGNOSIS — N186 End stage renal disease: Secondary | ICD-10-CM | POA: Diagnosis not present

## 2020-11-09 DIAGNOSIS — D689 Coagulation defect, unspecified: Secondary | ICD-10-CM | POA: Diagnosis not present

## 2020-11-09 DIAGNOSIS — Z992 Dependence on renal dialysis: Secondary | ICD-10-CM | POA: Diagnosis not present

## 2020-11-11 DIAGNOSIS — K739 Chronic hepatitis, unspecified: Secondary | ICD-10-CM | POA: Diagnosis not present

## 2020-11-11 DIAGNOSIS — N2581 Secondary hyperparathyroidism of renal origin: Secondary | ICD-10-CM | POA: Diagnosis not present

## 2020-11-11 DIAGNOSIS — N186 End stage renal disease: Secondary | ICD-10-CM | POA: Diagnosis not present

## 2020-11-11 DIAGNOSIS — Z992 Dependence on renal dialysis: Secondary | ICD-10-CM | POA: Diagnosis not present

## 2020-11-11 DIAGNOSIS — D631 Anemia in chronic kidney disease: Secondary | ICD-10-CM | POA: Diagnosis not present

## 2020-11-11 DIAGNOSIS — D689 Coagulation defect, unspecified: Secondary | ICD-10-CM | POA: Diagnosis not present

## 2020-11-11 DIAGNOSIS — E039 Hypothyroidism, unspecified: Secondary | ICD-10-CM | POA: Diagnosis not present

## 2020-11-14 DIAGNOSIS — N2581 Secondary hyperparathyroidism of renal origin: Secondary | ICD-10-CM | POA: Diagnosis not present

## 2020-11-14 DIAGNOSIS — D631 Anemia in chronic kidney disease: Secondary | ICD-10-CM | POA: Diagnosis not present

## 2020-11-14 DIAGNOSIS — E039 Hypothyroidism, unspecified: Secondary | ICD-10-CM | POA: Diagnosis not present

## 2020-11-14 DIAGNOSIS — N186 End stage renal disease: Secondary | ICD-10-CM | POA: Diagnosis not present

## 2020-11-14 DIAGNOSIS — Z992 Dependence on renal dialysis: Secondary | ICD-10-CM | POA: Diagnosis not present

## 2020-11-14 DIAGNOSIS — D689 Coagulation defect, unspecified: Secondary | ICD-10-CM | POA: Diagnosis not present

## 2020-11-14 DIAGNOSIS — K739 Chronic hepatitis, unspecified: Secondary | ICD-10-CM | POA: Diagnosis not present

## 2020-11-16 DIAGNOSIS — N186 End stage renal disease: Secondary | ICD-10-CM | POA: Diagnosis not present

## 2020-11-16 DIAGNOSIS — D631 Anemia in chronic kidney disease: Secondary | ICD-10-CM | POA: Diagnosis not present

## 2020-11-16 DIAGNOSIS — D689 Coagulation defect, unspecified: Secondary | ICD-10-CM | POA: Diagnosis not present

## 2020-11-16 DIAGNOSIS — E039 Hypothyroidism, unspecified: Secondary | ICD-10-CM | POA: Diagnosis not present

## 2020-11-16 DIAGNOSIS — Z992 Dependence on renal dialysis: Secondary | ICD-10-CM | POA: Diagnosis not present

## 2020-11-16 DIAGNOSIS — K739 Chronic hepatitis, unspecified: Secondary | ICD-10-CM | POA: Diagnosis not present

## 2020-11-16 DIAGNOSIS — N2581 Secondary hyperparathyroidism of renal origin: Secondary | ICD-10-CM | POA: Diagnosis not present

## 2020-11-18 DIAGNOSIS — D631 Anemia in chronic kidney disease: Secondary | ICD-10-CM | POA: Diagnosis not present

## 2020-11-18 DIAGNOSIS — N186 End stage renal disease: Secondary | ICD-10-CM | POA: Diagnosis not present

## 2020-11-18 DIAGNOSIS — K739 Chronic hepatitis, unspecified: Secondary | ICD-10-CM | POA: Diagnosis not present

## 2020-11-18 DIAGNOSIS — D689 Coagulation defect, unspecified: Secondary | ICD-10-CM | POA: Diagnosis not present

## 2020-11-18 DIAGNOSIS — N2581 Secondary hyperparathyroidism of renal origin: Secondary | ICD-10-CM | POA: Diagnosis not present

## 2020-11-18 DIAGNOSIS — E039 Hypothyroidism, unspecified: Secondary | ICD-10-CM | POA: Diagnosis not present

## 2020-11-18 DIAGNOSIS — Z992 Dependence on renal dialysis: Secondary | ICD-10-CM | POA: Diagnosis not present

## 2020-11-20 DIAGNOSIS — N186 End stage renal disease: Secondary | ICD-10-CM | POA: Diagnosis not present

## 2020-11-20 DIAGNOSIS — Z992 Dependence on renal dialysis: Secondary | ICD-10-CM | POA: Diagnosis not present

## 2020-11-20 DIAGNOSIS — T8612 Kidney transplant failure: Secondary | ICD-10-CM | POA: Diagnosis not present

## 2020-11-21 DIAGNOSIS — Z992 Dependence on renal dialysis: Secondary | ICD-10-CM | POA: Diagnosis not present

## 2020-11-21 DIAGNOSIS — N2581 Secondary hyperparathyroidism of renal origin: Secondary | ICD-10-CM | POA: Diagnosis not present

## 2020-11-21 DIAGNOSIS — N186 End stage renal disease: Secondary | ICD-10-CM | POA: Diagnosis not present

## 2020-11-21 DIAGNOSIS — D689 Coagulation defect, unspecified: Secondary | ICD-10-CM | POA: Diagnosis not present

## 2020-11-21 DIAGNOSIS — D631 Anemia in chronic kidney disease: Secondary | ICD-10-CM | POA: Diagnosis not present

## 2020-11-23 DIAGNOSIS — D689 Coagulation defect, unspecified: Secondary | ICD-10-CM | POA: Diagnosis not present

## 2020-11-23 DIAGNOSIS — Z992 Dependence on renal dialysis: Secondary | ICD-10-CM | POA: Diagnosis not present

## 2020-11-23 DIAGNOSIS — N186 End stage renal disease: Secondary | ICD-10-CM | POA: Diagnosis not present

## 2020-11-23 DIAGNOSIS — D631 Anemia in chronic kidney disease: Secondary | ICD-10-CM | POA: Diagnosis not present

## 2020-11-23 DIAGNOSIS — N2581 Secondary hyperparathyroidism of renal origin: Secondary | ICD-10-CM | POA: Diagnosis not present

## 2020-11-25 DIAGNOSIS — D631 Anemia in chronic kidney disease: Secondary | ICD-10-CM | POA: Diagnosis not present

## 2020-11-25 DIAGNOSIS — N186 End stage renal disease: Secondary | ICD-10-CM | POA: Diagnosis not present

## 2020-11-25 DIAGNOSIS — D689 Coagulation defect, unspecified: Secondary | ICD-10-CM | POA: Diagnosis not present

## 2020-11-25 DIAGNOSIS — N2581 Secondary hyperparathyroidism of renal origin: Secondary | ICD-10-CM | POA: Diagnosis not present

## 2020-11-25 DIAGNOSIS — Z992 Dependence on renal dialysis: Secondary | ICD-10-CM | POA: Diagnosis not present

## 2020-11-28 DIAGNOSIS — N186 End stage renal disease: Secondary | ICD-10-CM | POA: Diagnosis not present

## 2020-11-28 DIAGNOSIS — E039 Hypothyroidism, unspecified: Secondary | ICD-10-CM | POA: Diagnosis not present

## 2020-11-28 DIAGNOSIS — K739 Chronic hepatitis, unspecified: Secondary | ICD-10-CM | POA: Diagnosis not present

## 2020-11-28 DIAGNOSIS — D631 Anemia in chronic kidney disease: Secondary | ICD-10-CM | POA: Diagnosis not present

## 2020-11-28 DIAGNOSIS — N2581 Secondary hyperparathyroidism of renal origin: Secondary | ICD-10-CM | POA: Diagnosis not present

## 2020-11-28 DIAGNOSIS — Z992 Dependence on renal dialysis: Secondary | ICD-10-CM | POA: Diagnosis not present

## 2020-11-28 DIAGNOSIS — D689 Coagulation defect, unspecified: Secondary | ICD-10-CM | POA: Diagnosis not present

## 2020-11-30 DIAGNOSIS — D631 Anemia in chronic kidney disease: Secondary | ICD-10-CM | POA: Diagnosis not present

## 2020-11-30 DIAGNOSIS — N186 End stage renal disease: Secondary | ICD-10-CM | POA: Diagnosis not present

## 2020-11-30 DIAGNOSIS — D689 Coagulation defect, unspecified: Secondary | ICD-10-CM | POA: Diagnosis not present

## 2020-11-30 DIAGNOSIS — E039 Hypothyroidism, unspecified: Secondary | ICD-10-CM | POA: Diagnosis not present

## 2020-11-30 DIAGNOSIS — N2581 Secondary hyperparathyroidism of renal origin: Secondary | ICD-10-CM | POA: Diagnosis not present

## 2020-11-30 DIAGNOSIS — K739 Chronic hepatitis, unspecified: Secondary | ICD-10-CM | POA: Diagnosis not present

## 2020-11-30 DIAGNOSIS — Z992 Dependence on renal dialysis: Secondary | ICD-10-CM | POA: Diagnosis not present

## 2020-12-02 DIAGNOSIS — Z992 Dependence on renal dialysis: Secondary | ICD-10-CM | POA: Diagnosis not present

## 2020-12-02 DIAGNOSIS — N186 End stage renal disease: Secondary | ICD-10-CM | POA: Diagnosis not present

## 2020-12-02 DIAGNOSIS — D689 Coagulation defect, unspecified: Secondary | ICD-10-CM | POA: Diagnosis not present

## 2020-12-02 DIAGNOSIS — N2581 Secondary hyperparathyroidism of renal origin: Secondary | ICD-10-CM | POA: Diagnosis not present

## 2020-12-02 DIAGNOSIS — E039 Hypothyroidism, unspecified: Secondary | ICD-10-CM | POA: Diagnosis not present

## 2020-12-02 DIAGNOSIS — D631 Anemia in chronic kidney disease: Secondary | ICD-10-CM | POA: Diagnosis not present

## 2020-12-02 DIAGNOSIS — K739 Chronic hepatitis, unspecified: Secondary | ICD-10-CM | POA: Diagnosis not present

## 2020-12-05 DIAGNOSIS — D631 Anemia in chronic kidney disease: Secondary | ICD-10-CM | POA: Diagnosis not present

## 2020-12-05 DIAGNOSIS — N186 End stage renal disease: Secondary | ICD-10-CM | POA: Diagnosis not present

## 2020-12-05 DIAGNOSIS — N2581 Secondary hyperparathyroidism of renal origin: Secondary | ICD-10-CM | POA: Diagnosis not present

## 2020-12-05 DIAGNOSIS — Z992 Dependence on renal dialysis: Secondary | ICD-10-CM | POA: Diagnosis not present

## 2020-12-05 DIAGNOSIS — D689 Coagulation defect, unspecified: Secondary | ICD-10-CM | POA: Diagnosis not present

## 2020-12-07 DIAGNOSIS — N186 End stage renal disease: Secondary | ICD-10-CM | POA: Diagnosis not present

## 2020-12-07 DIAGNOSIS — D689 Coagulation defect, unspecified: Secondary | ICD-10-CM | POA: Diagnosis not present

## 2020-12-07 DIAGNOSIS — Z992 Dependence on renal dialysis: Secondary | ICD-10-CM | POA: Diagnosis not present

## 2020-12-07 DIAGNOSIS — D631 Anemia in chronic kidney disease: Secondary | ICD-10-CM | POA: Diagnosis not present

## 2020-12-07 DIAGNOSIS — N2581 Secondary hyperparathyroidism of renal origin: Secondary | ICD-10-CM | POA: Diagnosis not present

## 2020-12-09 DIAGNOSIS — Z992 Dependence on renal dialysis: Secondary | ICD-10-CM | POA: Diagnosis not present

## 2020-12-09 DIAGNOSIS — D689 Coagulation defect, unspecified: Secondary | ICD-10-CM | POA: Diagnosis not present

## 2020-12-09 DIAGNOSIS — N2581 Secondary hyperparathyroidism of renal origin: Secondary | ICD-10-CM | POA: Diagnosis not present

## 2020-12-09 DIAGNOSIS — N186 End stage renal disease: Secondary | ICD-10-CM | POA: Diagnosis not present

## 2020-12-09 DIAGNOSIS — D631 Anemia in chronic kidney disease: Secondary | ICD-10-CM | POA: Diagnosis not present

## 2020-12-12 DIAGNOSIS — N186 End stage renal disease: Secondary | ICD-10-CM | POA: Diagnosis not present

## 2020-12-12 DIAGNOSIS — D689 Coagulation defect, unspecified: Secondary | ICD-10-CM | POA: Diagnosis not present

## 2020-12-12 DIAGNOSIS — D631 Anemia in chronic kidney disease: Secondary | ICD-10-CM | POA: Diagnosis not present

## 2020-12-12 DIAGNOSIS — N2581 Secondary hyperparathyroidism of renal origin: Secondary | ICD-10-CM | POA: Diagnosis not present

## 2020-12-12 DIAGNOSIS — R52 Pain, unspecified: Secondary | ICD-10-CM | POA: Diagnosis not present

## 2020-12-12 DIAGNOSIS — Z992 Dependence on renal dialysis: Secondary | ICD-10-CM | POA: Diagnosis not present

## 2020-12-14 DIAGNOSIS — N2581 Secondary hyperparathyroidism of renal origin: Secondary | ICD-10-CM | POA: Diagnosis not present

## 2020-12-14 DIAGNOSIS — D631 Anemia in chronic kidney disease: Secondary | ICD-10-CM | POA: Diagnosis not present

## 2020-12-14 DIAGNOSIS — D689 Coagulation defect, unspecified: Secondary | ICD-10-CM | POA: Diagnosis not present

## 2020-12-14 DIAGNOSIS — N186 End stage renal disease: Secondary | ICD-10-CM | POA: Diagnosis not present

## 2020-12-14 DIAGNOSIS — R52 Pain, unspecified: Secondary | ICD-10-CM | POA: Diagnosis not present

## 2020-12-14 DIAGNOSIS — Z992 Dependence on renal dialysis: Secondary | ICD-10-CM | POA: Diagnosis not present

## 2020-12-16 DIAGNOSIS — N2581 Secondary hyperparathyroidism of renal origin: Secondary | ICD-10-CM | POA: Diagnosis not present

## 2020-12-16 DIAGNOSIS — N186 End stage renal disease: Secondary | ICD-10-CM | POA: Diagnosis not present

## 2020-12-16 DIAGNOSIS — D631 Anemia in chronic kidney disease: Secondary | ICD-10-CM | POA: Diagnosis not present

## 2020-12-16 DIAGNOSIS — R52 Pain, unspecified: Secondary | ICD-10-CM | POA: Diagnosis not present

## 2020-12-16 DIAGNOSIS — D689 Coagulation defect, unspecified: Secondary | ICD-10-CM | POA: Diagnosis not present

## 2020-12-16 DIAGNOSIS — Z992 Dependence on renal dialysis: Secondary | ICD-10-CM | POA: Diagnosis not present

## 2020-12-19 DIAGNOSIS — N186 End stage renal disease: Secondary | ICD-10-CM | POA: Diagnosis not present

## 2020-12-19 DIAGNOSIS — N2581 Secondary hyperparathyroidism of renal origin: Secondary | ICD-10-CM | POA: Diagnosis not present

## 2020-12-19 DIAGNOSIS — D689 Coagulation defect, unspecified: Secondary | ICD-10-CM | POA: Diagnosis not present

## 2020-12-19 DIAGNOSIS — Z992 Dependence on renal dialysis: Secondary | ICD-10-CM | POA: Diagnosis not present

## 2020-12-19 DIAGNOSIS — D631 Anemia in chronic kidney disease: Secondary | ICD-10-CM | POA: Diagnosis not present

## 2020-12-20 DIAGNOSIS — M0579 Rheumatoid arthritis with rheumatoid factor of multiple sites without organ or systems involvement: Secondary | ICD-10-CM | POA: Diagnosis not present

## 2020-12-20 DIAGNOSIS — Z79899 Other long term (current) drug therapy: Secondary | ICD-10-CM | POA: Diagnosis not present

## 2020-12-21 DIAGNOSIS — Z992 Dependence on renal dialysis: Secondary | ICD-10-CM | POA: Diagnosis not present

## 2020-12-21 DIAGNOSIS — T8612 Kidney transplant failure: Secondary | ICD-10-CM | POA: Diagnosis not present

## 2020-12-21 DIAGNOSIS — N186 End stage renal disease: Secondary | ICD-10-CM | POA: Diagnosis not present

## 2020-12-23 DIAGNOSIS — D689 Coagulation defect, unspecified: Secondary | ICD-10-CM | POA: Diagnosis not present

## 2020-12-23 DIAGNOSIS — D631 Anemia in chronic kidney disease: Secondary | ICD-10-CM | POA: Diagnosis not present

## 2020-12-23 DIAGNOSIS — Z992 Dependence on renal dialysis: Secondary | ICD-10-CM | POA: Diagnosis not present

## 2020-12-23 DIAGNOSIS — N186 End stage renal disease: Secondary | ICD-10-CM | POA: Diagnosis not present

## 2020-12-23 DIAGNOSIS — N2581 Secondary hyperparathyroidism of renal origin: Secondary | ICD-10-CM | POA: Diagnosis not present

## 2020-12-26 DIAGNOSIS — N186 End stage renal disease: Secondary | ICD-10-CM | POA: Diagnosis not present

## 2020-12-26 DIAGNOSIS — D689 Coagulation defect, unspecified: Secondary | ICD-10-CM | POA: Diagnosis not present

## 2020-12-26 DIAGNOSIS — Z992 Dependence on renal dialysis: Secondary | ICD-10-CM | POA: Diagnosis not present

## 2020-12-26 DIAGNOSIS — D631 Anemia in chronic kidney disease: Secondary | ICD-10-CM | POA: Diagnosis not present

## 2020-12-26 DIAGNOSIS — N2581 Secondary hyperparathyroidism of renal origin: Secondary | ICD-10-CM | POA: Diagnosis not present

## 2020-12-28 DIAGNOSIS — Z992 Dependence on renal dialysis: Secondary | ICD-10-CM | POA: Diagnosis not present

## 2020-12-28 DIAGNOSIS — N2581 Secondary hyperparathyroidism of renal origin: Secondary | ICD-10-CM | POA: Diagnosis not present

## 2020-12-28 DIAGNOSIS — N186 End stage renal disease: Secondary | ICD-10-CM | POA: Diagnosis not present

## 2020-12-28 DIAGNOSIS — D631 Anemia in chronic kidney disease: Secondary | ICD-10-CM | POA: Diagnosis not present

## 2020-12-28 DIAGNOSIS — D689 Coagulation defect, unspecified: Secondary | ICD-10-CM | POA: Diagnosis not present

## 2020-12-30 DIAGNOSIS — N2581 Secondary hyperparathyroidism of renal origin: Secondary | ICD-10-CM | POA: Diagnosis not present

## 2020-12-30 DIAGNOSIS — D689 Coagulation defect, unspecified: Secondary | ICD-10-CM | POA: Diagnosis not present

## 2020-12-30 DIAGNOSIS — Z992 Dependence on renal dialysis: Secondary | ICD-10-CM | POA: Diagnosis not present

## 2020-12-30 DIAGNOSIS — N186 End stage renal disease: Secondary | ICD-10-CM | POA: Diagnosis not present

## 2020-12-30 DIAGNOSIS — D631 Anemia in chronic kidney disease: Secondary | ICD-10-CM | POA: Diagnosis not present

## 2021-01-02 DIAGNOSIS — Z992 Dependence on renal dialysis: Secondary | ICD-10-CM | POA: Diagnosis not present

## 2021-01-02 DIAGNOSIS — D689 Coagulation defect, unspecified: Secondary | ICD-10-CM | POA: Diagnosis not present

## 2021-01-02 DIAGNOSIS — E039 Hypothyroidism, unspecified: Secondary | ICD-10-CM | POA: Diagnosis not present

## 2021-01-02 DIAGNOSIS — R519 Headache, unspecified: Secondary | ICD-10-CM | POA: Diagnosis not present

## 2021-01-02 DIAGNOSIS — N2581 Secondary hyperparathyroidism of renal origin: Secondary | ICD-10-CM | POA: Diagnosis not present

## 2021-01-02 DIAGNOSIS — N186 End stage renal disease: Secondary | ICD-10-CM | POA: Diagnosis not present

## 2021-01-02 DIAGNOSIS — D631 Anemia in chronic kidney disease: Secondary | ICD-10-CM | POA: Diagnosis not present

## 2021-01-02 DIAGNOSIS — E1129 Type 2 diabetes mellitus with other diabetic kidney complication: Secondary | ICD-10-CM | POA: Diagnosis not present

## 2021-01-04 DIAGNOSIS — E1129 Type 2 diabetes mellitus with other diabetic kidney complication: Secondary | ICD-10-CM | POA: Diagnosis not present

## 2021-01-04 DIAGNOSIS — N2581 Secondary hyperparathyroidism of renal origin: Secondary | ICD-10-CM | POA: Diagnosis not present

## 2021-01-04 DIAGNOSIS — D689 Coagulation defect, unspecified: Secondary | ICD-10-CM | POA: Diagnosis not present

## 2021-01-04 DIAGNOSIS — D631 Anemia in chronic kidney disease: Secondary | ICD-10-CM | POA: Diagnosis not present

## 2021-01-04 DIAGNOSIS — R519 Headache, unspecified: Secondary | ICD-10-CM | POA: Diagnosis not present

## 2021-01-04 DIAGNOSIS — Z992 Dependence on renal dialysis: Secondary | ICD-10-CM | POA: Diagnosis not present

## 2021-01-04 DIAGNOSIS — E039 Hypothyroidism, unspecified: Secondary | ICD-10-CM | POA: Diagnosis not present

## 2021-01-04 DIAGNOSIS — N186 End stage renal disease: Secondary | ICD-10-CM | POA: Diagnosis not present

## 2021-01-05 DIAGNOSIS — J309 Allergic rhinitis, unspecified: Secondary | ICD-10-CM | POA: Diagnosis not present

## 2021-01-05 DIAGNOSIS — J4550 Severe persistent asthma, uncomplicated: Secondary | ICD-10-CM | POA: Diagnosis not present

## 2021-01-05 DIAGNOSIS — R053 Chronic cough: Secondary | ICD-10-CM | POA: Diagnosis not present

## 2021-01-05 DIAGNOSIS — D3A090 Benign carcinoid tumor of the bronchus and lung: Secondary | ICD-10-CM | POA: Diagnosis not present

## 2021-01-06 DIAGNOSIS — Z992 Dependence on renal dialysis: Secondary | ICD-10-CM | POA: Diagnosis not present

## 2021-01-06 DIAGNOSIS — D631 Anemia in chronic kidney disease: Secondary | ICD-10-CM | POA: Diagnosis not present

## 2021-01-06 DIAGNOSIS — E039 Hypothyroidism, unspecified: Secondary | ICD-10-CM | POA: Diagnosis not present

## 2021-01-06 DIAGNOSIS — N186 End stage renal disease: Secondary | ICD-10-CM | POA: Diagnosis not present

## 2021-01-06 DIAGNOSIS — N2581 Secondary hyperparathyroidism of renal origin: Secondary | ICD-10-CM | POA: Diagnosis not present

## 2021-01-06 DIAGNOSIS — R519 Headache, unspecified: Secondary | ICD-10-CM | POA: Diagnosis not present

## 2021-01-06 DIAGNOSIS — D689 Coagulation defect, unspecified: Secondary | ICD-10-CM | POA: Diagnosis not present

## 2021-01-06 DIAGNOSIS — E1129 Type 2 diabetes mellitus with other diabetic kidney complication: Secondary | ICD-10-CM | POA: Diagnosis not present

## 2021-01-09 DIAGNOSIS — N2581 Secondary hyperparathyroidism of renal origin: Secondary | ICD-10-CM | POA: Diagnosis not present

## 2021-01-09 DIAGNOSIS — D689 Coagulation defect, unspecified: Secondary | ICD-10-CM | POA: Diagnosis not present

## 2021-01-09 DIAGNOSIS — D631 Anemia in chronic kidney disease: Secondary | ICD-10-CM | POA: Diagnosis not present

## 2021-01-09 DIAGNOSIS — Z992 Dependence on renal dialysis: Secondary | ICD-10-CM | POA: Diagnosis not present

## 2021-01-09 DIAGNOSIS — N186 End stage renal disease: Secondary | ICD-10-CM | POA: Diagnosis not present

## 2021-01-11 DIAGNOSIS — N186 End stage renal disease: Secondary | ICD-10-CM | POA: Diagnosis not present

## 2021-01-11 DIAGNOSIS — Z992 Dependence on renal dialysis: Secondary | ICD-10-CM | POA: Diagnosis not present

## 2021-01-11 DIAGNOSIS — D689 Coagulation defect, unspecified: Secondary | ICD-10-CM | POA: Diagnosis not present

## 2021-01-11 DIAGNOSIS — D631 Anemia in chronic kidney disease: Secondary | ICD-10-CM | POA: Diagnosis not present

## 2021-01-11 DIAGNOSIS — N2581 Secondary hyperparathyroidism of renal origin: Secondary | ICD-10-CM | POA: Diagnosis not present

## 2021-01-13 DIAGNOSIS — N186 End stage renal disease: Secondary | ICD-10-CM | POA: Diagnosis not present

## 2021-01-13 DIAGNOSIS — D631 Anemia in chronic kidney disease: Secondary | ICD-10-CM | POA: Diagnosis not present

## 2021-01-13 DIAGNOSIS — N2581 Secondary hyperparathyroidism of renal origin: Secondary | ICD-10-CM | POA: Diagnosis not present

## 2021-01-13 DIAGNOSIS — Z992 Dependence on renal dialysis: Secondary | ICD-10-CM | POA: Diagnosis not present

## 2021-01-13 DIAGNOSIS — D689 Coagulation defect, unspecified: Secondary | ICD-10-CM | POA: Diagnosis not present

## 2021-01-16 DIAGNOSIS — N2581 Secondary hyperparathyroidism of renal origin: Secondary | ICD-10-CM | POA: Diagnosis not present

## 2021-01-16 DIAGNOSIS — D689 Coagulation defect, unspecified: Secondary | ICD-10-CM | POA: Diagnosis not present

## 2021-01-16 DIAGNOSIS — N186 End stage renal disease: Secondary | ICD-10-CM | POA: Diagnosis not present

## 2021-01-16 DIAGNOSIS — Z992 Dependence on renal dialysis: Secondary | ICD-10-CM | POA: Diagnosis not present

## 2021-01-16 DIAGNOSIS — D631 Anemia in chronic kidney disease: Secondary | ICD-10-CM | POA: Diagnosis not present

## 2021-01-18 DIAGNOSIS — N2581 Secondary hyperparathyroidism of renal origin: Secondary | ICD-10-CM | POA: Diagnosis not present

## 2021-01-18 DIAGNOSIS — Z992 Dependence on renal dialysis: Secondary | ICD-10-CM | POA: Diagnosis not present

## 2021-01-18 DIAGNOSIS — D631 Anemia in chronic kidney disease: Secondary | ICD-10-CM | POA: Diagnosis not present

## 2021-01-18 DIAGNOSIS — N186 End stage renal disease: Secondary | ICD-10-CM | POA: Diagnosis not present

## 2021-01-18 DIAGNOSIS — D689 Coagulation defect, unspecified: Secondary | ICD-10-CM | POA: Diagnosis not present

## 2021-01-20 DIAGNOSIS — D631 Anemia in chronic kidney disease: Secondary | ICD-10-CM | POA: Diagnosis not present

## 2021-01-20 DIAGNOSIS — N186 End stage renal disease: Secondary | ICD-10-CM | POA: Diagnosis not present

## 2021-01-20 DIAGNOSIS — T8612 Kidney transplant failure: Secondary | ICD-10-CM | POA: Diagnosis not present

## 2021-01-20 DIAGNOSIS — D689 Coagulation defect, unspecified: Secondary | ICD-10-CM | POA: Diagnosis not present

## 2021-01-20 DIAGNOSIS — Z992 Dependence on renal dialysis: Secondary | ICD-10-CM | POA: Diagnosis not present

## 2021-01-20 DIAGNOSIS — N2581 Secondary hyperparathyroidism of renal origin: Secondary | ICD-10-CM | POA: Diagnosis not present

## 2021-01-23 DIAGNOSIS — N186 End stage renal disease: Secondary | ICD-10-CM | POA: Diagnosis not present

## 2021-01-23 DIAGNOSIS — N2581 Secondary hyperparathyroidism of renal origin: Secondary | ICD-10-CM | POA: Diagnosis not present

## 2021-01-23 DIAGNOSIS — D631 Anemia in chronic kidney disease: Secondary | ICD-10-CM | POA: Diagnosis not present

## 2021-01-23 DIAGNOSIS — D689 Coagulation defect, unspecified: Secondary | ICD-10-CM | POA: Diagnosis not present

## 2021-01-23 DIAGNOSIS — Z992 Dependence on renal dialysis: Secondary | ICD-10-CM | POA: Diagnosis not present

## 2021-01-25 DIAGNOSIS — D689 Coagulation defect, unspecified: Secondary | ICD-10-CM | POA: Diagnosis not present

## 2021-01-25 DIAGNOSIS — N2581 Secondary hyperparathyroidism of renal origin: Secondary | ICD-10-CM | POA: Diagnosis not present

## 2021-01-25 DIAGNOSIS — N186 End stage renal disease: Secondary | ICD-10-CM | POA: Diagnosis not present

## 2021-01-25 DIAGNOSIS — D631 Anemia in chronic kidney disease: Secondary | ICD-10-CM | POA: Diagnosis not present

## 2021-01-25 DIAGNOSIS — Z992 Dependence on renal dialysis: Secondary | ICD-10-CM | POA: Diagnosis not present

## 2021-01-27 DIAGNOSIS — D689 Coagulation defect, unspecified: Secondary | ICD-10-CM | POA: Diagnosis not present

## 2021-01-27 DIAGNOSIS — N2581 Secondary hyperparathyroidism of renal origin: Secondary | ICD-10-CM | POA: Diagnosis not present

## 2021-01-27 DIAGNOSIS — D631 Anemia in chronic kidney disease: Secondary | ICD-10-CM | POA: Diagnosis not present

## 2021-01-27 DIAGNOSIS — Z992 Dependence on renal dialysis: Secondary | ICD-10-CM | POA: Diagnosis not present

## 2021-01-27 DIAGNOSIS — N186 End stage renal disease: Secondary | ICD-10-CM | POA: Diagnosis not present

## 2021-01-30 DIAGNOSIS — D631 Anemia in chronic kidney disease: Secondary | ICD-10-CM | POA: Diagnosis not present

## 2021-01-30 DIAGNOSIS — E039 Hypothyroidism, unspecified: Secondary | ICD-10-CM | POA: Diagnosis not present

## 2021-01-30 DIAGNOSIS — Z992 Dependence on renal dialysis: Secondary | ICD-10-CM | POA: Diagnosis not present

## 2021-01-30 DIAGNOSIS — N2581 Secondary hyperparathyroidism of renal origin: Secondary | ICD-10-CM | POA: Diagnosis not present

## 2021-01-30 DIAGNOSIS — D689 Coagulation defect, unspecified: Secondary | ICD-10-CM | POA: Diagnosis not present

## 2021-01-30 DIAGNOSIS — N186 End stage renal disease: Secondary | ICD-10-CM | POA: Diagnosis not present

## 2021-02-01 DIAGNOSIS — D689 Coagulation defect, unspecified: Secondary | ICD-10-CM | POA: Diagnosis not present

## 2021-02-01 DIAGNOSIS — N186 End stage renal disease: Secondary | ICD-10-CM | POA: Diagnosis not present

## 2021-02-01 DIAGNOSIS — Z992 Dependence on renal dialysis: Secondary | ICD-10-CM | POA: Diagnosis not present

## 2021-02-01 DIAGNOSIS — D631 Anemia in chronic kidney disease: Secondary | ICD-10-CM | POA: Diagnosis not present

## 2021-02-01 DIAGNOSIS — E039 Hypothyroidism, unspecified: Secondary | ICD-10-CM | POA: Diagnosis not present

## 2021-02-01 DIAGNOSIS — N2581 Secondary hyperparathyroidism of renal origin: Secondary | ICD-10-CM | POA: Diagnosis not present

## 2021-02-03 DIAGNOSIS — N2581 Secondary hyperparathyroidism of renal origin: Secondary | ICD-10-CM | POA: Diagnosis not present

## 2021-02-03 DIAGNOSIS — N186 End stage renal disease: Secondary | ICD-10-CM | POA: Diagnosis not present

## 2021-02-03 DIAGNOSIS — E039 Hypothyroidism, unspecified: Secondary | ICD-10-CM | POA: Diagnosis not present

## 2021-02-03 DIAGNOSIS — Z992 Dependence on renal dialysis: Secondary | ICD-10-CM | POA: Diagnosis not present

## 2021-02-03 DIAGNOSIS — D689 Coagulation defect, unspecified: Secondary | ICD-10-CM | POA: Diagnosis not present

## 2021-02-03 DIAGNOSIS — D631 Anemia in chronic kidney disease: Secondary | ICD-10-CM | POA: Diagnosis not present

## 2021-02-06 DIAGNOSIS — N186 End stage renal disease: Secondary | ICD-10-CM | POA: Diagnosis not present

## 2021-02-06 DIAGNOSIS — N2581 Secondary hyperparathyroidism of renal origin: Secondary | ICD-10-CM | POA: Diagnosis not present

## 2021-02-06 DIAGNOSIS — R52 Pain, unspecified: Secondary | ICD-10-CM | POA: Diagnosis not present

## 2021-02-06 DIAGNOSIS — D631 Anemia in chronic kidney disease: Secondary | ICD-10-CM | POA: Diagnosis not present

## 2021-02-06 DIAGNOSIS — D689 Coagulation defect, unspecified: Secondary | ICD-10-CM | POA: Diagnosis not present

## 2021-02-06 DIAGNOSIS — Z992 Dependence on renal dialysis: Secondary | ICD-10-CM | POA: Diagnosis not present

## 2021-02-07 DIAGNOSIS — E039 Hypothyroidism, unspecified: Secondary | ICD-10-CM | POA: Diagnosis not present

## 2021-02-07 DIAGNOSIS — Z992 Dependence on renal dialysis: Secondary | ICD-10-CM | POA: Diagnosis not present

## 2021-02-07 DIAGNOSIS — D631 Anemia in chronic kidney disease: Secondary | ICD-10-CM | POA: Diagnosis not present

## 2021-02-07 DIAGNOSIS — R52 Pain, unspecified: Secondary | ICD-10-CM | POA: Diagnosis not present

## 2021-02-07 DIAGNOSIS — I10 Essential (primary) hypertension: Secondary | ICD-10-CM | POA: Diagnosis not present

## 2021-02-07 DIAGNOSIS — N186 End stage renal disease: Secondary | ICD-10-CM | POA: Diagnosis not present

## 2021-02-07 DIAGNOSIS — N2581 Secondary hyperparathyroidism of renal origin: Secondary | ICD-10-CM | POA: Diagnosis not present

## 2021-02-07 DIAGNOSIS — D689 Coagulation defect, unspecified: Secondary | ICD-10-CM | POA: Diagnosis not present

## 2021-02-08 DIAGNOSIS — N2581 Secondary hyperparathyroidism of renal origin: Secondary | ICD-10-CM | POA: Diagnosis not present

## 2021-02-08 DIAGNOSIS — N186 End stage renal disease: Secondary | ICD-10-CM | POA: Diagnosis not present

## 2021-02-08 DIAGNOSIS — D689 Coagulation defect, unspecified: Secondary | ICD-10-CM | POA: Diagnosis not present

## 2021-02-08 DIAGNOSIS — D631 Anemia in chronic kidney disease: Secondary | ICD-10-CM | POA: Diagnosis not present

## 2021-02-08 DIAGNOSIS — Z992 Dependence on renal dialysis: Secondary | ICD-10-CM | POA: Diagnosis not present

## 2021-02-08 DIAGNOSIS — R52 Pain, unspecified: Secondary | ICD-10-CM | POA: Diagnosis not present

## 2021-02-10 DIAGNOSIS — N186 End stage renal disease: Secondary | ICD-10-CM | POA: Diagnosis not present

## 2021-02-10 DIAGNOSIS — D689 Coagulation defect, unspecified: Secondary | ICD-10-CM | POA: Diagnosis not present

## 2021-02-10 DIAGNOSIS — Z992 Dependence on renal dialysis: Secondary | ICD-10-CM | POA: Diagnosis not present

## 2021-02-10 DIAGNOSIS — R52 Pain, unspecified: Secondary | ICD-10-CM | POA: Diagnosis not present

## 2021-02-10 DIAGNOSIS — D631 Anemia in chronic kidney disease: Secondary | ICD-10-CM | POA: Diagnosis not present

## 2021-02-10 DIAGNOSIS — N2581 Secondary hyperparathyroidism of renal origin: Secondary | ICD-10-CM | POA: Diagnosis not present

## 2021-02-13 DIAGNOSIS — N2581 Secondary hyperparathyroidism of renal origin: Secondary | ICD-10-CM | POA: Diagnosis not present

## 2021-02-13 DIAGNOSIS — N186 End stage renal disease: Secondary | ICD-10-CM | POA: Diagnosis not present

## 2021-02-13 DIAGNOSIS — D631 Anemia in chronic kidney disease: Secondary | ICD-10-CM | POA: Diagnosis not present

## 2021-02-13 DIAGNOSIS — Z992 Dependence on renal dialysis: Secondary | ICD-10-CM | POA: Diagnosis not present

## 2021-02-13 DIAGNOSIS — D689 Coagulation defect, unspecified: Secondary | ICD-10-CM | POA: Diagnosis not present

## 2021-02-15 DIAGNOSIS — N2581 Secondary hyperparathyroidism of renal origin: Secondary | ICD-10-CM | POA: Diagnosis not present

## 2021-02-15 DIAGNOSIS — D689 Coagulation defect, unspecified: Secondary | ICD-10-CM | POA: Diagnosis not present

## 2021-02-15 DIAGNOSIS — N186 End stage renal disease: Secondary | ICD-10-CM | POA: Diagnosis not present

## 2021-02-15 DIAGNOSIS — D631 Anemia in chronic kidney disease: Secondary | ICD-10-CM | POA: Diagnosis not present

## 2021-02-15 DIAGNOSIS — Z992 Dependence on renal dialysis: Secondary | ICD-10-CM | POA: Diagnosis not present

## 2021-02-17 DIAGNOSIS — Z992 Dependence on renal dialysis: Secondary | ICD-10-CM | POA: Diagnosis not present

## 2021-02-17 DIAGNOSIS — D631 Anemia in chronic kidney disease: Secondary | ICD-10-CM | POA: Diagnosis not present

## 2021-02-17 DIAGNOSIS — D689 Coagulation defect, unspecified: Secondary | ICD-10-CM | POA: Diagnosis not present

## 2021-02-17 DIAGNOSIS — N2581 Secondary hyperparathyroidism of renal origin: Secondary | ICD-10-CM | POA: Diagnosis not present

## 2021-02-17 DIAGNOSIS — N186 End stage renal disease: Secondary | ICD-10-CM | POA: Diagnosis not present

## 2021-02-20 DIAGNOSIS — N186 End stage renal disease: Secondary | ICD-10-CM | POA: Diagnosis not present

## 2021-02-20 DIAGNOSIS — T8612 Kidney transplant failure: Secondary | ICD-10-CM | POA: Diagnosis not present

## 2021-02-20 DIAGNOSIS — N2581 Secondary hyperparathyroidism of renal origin: Secondary | ICD-10-CM | POA: Diagnosis not present

## 2021-02-20 DIAGNOSIS — D689 Coagulation defect, unspecified: Secondary | ICD-10-CM | POA: Diagnosis not present

## 2021-02-20 DIAGNOSIS — Z992 Dependence on renal dialysis: Secondary | ICD-10-CM | POA: Diagnosis not present

## 2021-02-21 DIAGNOSIS — M25511 Pain in right shoulder: Secondary | ICD-10-CM | POA: Diagnosis not present

## 2021-02-21 DIAGNOSIS — M25512 Pain in left shoulder: Secondary | ICD-10-CM | POA: Diagnosis not present

## 2021-02-22 DIAGNOSIS — D631 Anemia in chronic kidney disease: Secondary | ICD-10-CM | POA: Diagnosis not present

## 2021-02-22 DIAGNOSIS — N2581 Secondary hyperparathyroidism of renal origin: Secondary | ICD-10-CM | POA: Diagnosis not present

## 2021-02-22 DIAGNOSIS — Z992 Dependence on renal dialysis: Secondary | ICD-10-CM | POA: Diagnosis not present

## 2021-02-22 DIAGNOSIS — D689 Coagulation defect, unspecified: Secondary | ICD-10-CM | POA: Diagnosis not present

## 2021-02-22 DIAGNOSIS — N186 End stage renal disease: Secondary | ICD-10-CM | POA: Diagnosis not present

## 2021-02-24 DIAGNOSIS — D631 Anemia in chronic kidney disease: Secondary | ICD-10-CM | POA: Diagnosis not present

## 2021-02-24 DIAGNOSIS — Z992 Dependence on renal dialysis: Secondary | ICD-10-CM | POA: Diagnosis not present

## 2021-02-24 DIAGNOSIS — N2581 Secondary hyperparathyroidism of renal origin: Secondary | ICD-10-CM | POA: Diagnosis not present

## 2021-02-24 DIAGNOSIS — D689 Coagulation defect, unspecified: Secondary | ICD-10-CM | POA: Diagnosis not present

## 2021-02-24 DIAGNOSIS — N186 End stage renal disease: Secondary | ICD-10-CM | POA: Diagnosis not present

## 2021-02-27 DIAGNOSIS — D631 Anemia in chronic kidney disease: Secondary | ICD-10-CM | POA: Diagnosis not present

## 2021-02-27 DIAGNOSIS — N2581 Secondary hyperparathyroidism of renal origin: Secondary | ICD-10-CM | POA: Diagnosis not present

## 2021-02-27 DIAGNOSIS — E039 Hypothyroidism, unspecified: Secondary | ICD-10-CM | POA: Diagnosis not present

## 2021-02-27 DIAGNOSIS — D689 Coagulation defect, unspecified: Secondary | ICD-10-CM | POA: Diagnosis not present

## 2021-02-27 DIAGNOSIS — N186 End stage renal disease: Secondary | ICD-10-CM | POA: Diagnosis not present

## 2021-02-27 DIAGNOSIS — Z992 Dependence on renal dialysis: Secondary | ICD-10-CM | POA: Diagnosis not present

## 2021-03-03 DIAGNOSIS — Z992 Dependence on renal dialysis: Secondary | ICD-10-CM | POA: Diagnosis not present

## 2021-03-03 DIAGNOSIS — N186 End stage renal disease: Secondary | ICD-10-CM | POA: Diagnosis not present

## 2021-03-03 DIAGNOSIS — N2581 Secondary hyperparathyroidism of renal origin: Secondary | ICD-10-CM | POA: Diagnosis not present

## 2021-03-03 DIAGNOSIS — E039 Hypothyroidism, unspecified: Secondary | ICD-10-CM | POA: Diagnosis not present

## 2021-03-03 DIAGNOSIS — D689 Coagulation defect, unspecified: Secondary | ICD-10-CM | POA: Diagnosis not present

## 2021-03-03 DIAGNOSIS — D631 Anemia in chronic kidney disease: Secondary | ICD-10-CM | POA: Diagnosis not present

## 2021-03-06 DIAGNOSIS — D689 Coagulation defect, unspecified: Secondary | ICD-10-CM | POA: Diagnosis not present

## 2021-03-06 DIAGNOSIS — Z992 Dependence on renal dialysis: Secondary | ICD-10-CM | POA: Diagnosis not present

## 2021-03-06 DIAGNOSIS — N2581 Secondary hyperparathyroidism of renal origin: Secondary | ICD-10-CM | POA: Diagnosis not present

## 2021-03-06 DIAGNOSIS — D631 Anemia in chronic kidney disease: Secondary | ICD-10-CM | POA: Diagnosis not present

## 2021-03-06 DIAGNOSIS — N186 End stage renal disease: Secondary | ICD-10-CM | POA: Diagnosis not present

## 2021-03-08 DIAGNOSIS — Z992 Dependence on renal dialysis: Secondary | ICD-10-CM | POA: Diagnosis not present

## 2021-03-08 DIAGNOSIS — N186 End stage renal disease: Secondary | ICD-10-CM | POA: Diagnosis not present

## 2021-03-08 DIAGNOSIS — N2581 Secondary hyperparathyroidism of renal origin: Secondary | ICD-10-CM | POA: Diagnosis not present

## 2021-03-08 DIAGNOSIS — D631 Anemia in chronic kidney disease: Secondary | ICD-10-CM | POA: Diagnosis not present

## 2021-03-08 DIAGNOSIS — D689 Coagulation defect, unspecified: Secondary | ICD-10-CM | POA: Diagnosis not present

## 2021-03-10 DIAGNOSIS — D689 Coagulation defect, unspecified: Secondary | ICD-10-CM | POA: Diagnosis not present

## 2021-03-10 DIAGNOSIS — Z992 Dependence on renal dialysis: Secondary | ICD-10-CM | POA: Diagnosis not present

## 2021-03-10 DIAGNOSIS — N2581 Secondary hyperparathyroidism of renal origin: Secondary | ICD-10-CM | POA: Diagnosis not present

## 2021-03-10 DIAGNOSIS — N186 End stage renal disease: Secondary | ICD-10-CM | POA: Diagnosis not present

## 2021-03-10 DIAGNOSIS — D631 Anemia in chronic kidney disease: Secondary | ICD-10-CM | POA: Diagnosis not present

## 2021-03-13 DIAGNOSIS — D689 Coagulation defect, unspecified: Secondary | ICD-10-CM | POA: Diagnosis not present

## 2021-03-13 DIAGNOSIS — Z992 Dependence on renal dialysis: Secondary | ICD-10-CM | POA: Diagnosis not present

## 2021-03-13 DIAGNOSIS — N186 End stage renal disease: Secondary | ICD-10-CM | POA: Diagnosis not present

## 2021-03-13 DIAGNOSIS — D631 Anemia in chronic kidney disease: Secondary | ICD-10-CM | POA: Diagnosis not present

## 2021-03-13 DIAGNOSIS — N2581 Secondary hyperparathyroidism of renal origin: Secondary | ICD-10-CM | POA: Diagnosis not present

## 2021-03-15 DIAGNOSIS — D631 Anemia in chronic kidney disease: Secondary | ICD-10-CM | POA: Diagnosis not present

## 2021-03-15 DIAGNOSIS — Z992 Dependence on renal dialysis: Secondary | ICD-10-CM | POA: Diagnosis not present

## 2021-03-15 DIAGNOSIS — N186 End stage renal disease: Secondary | ICD-10-CM | POA: Diagnosis not present

## 2021-03-15 DIAGNOSIS — D689 Coagulation defect, unspecified: Secondary | ICD-10-CM | POA: Diagnosis not present

## 2021-03-15 DIAGNOSIS — N2581 Secondary hyperparathyroidism of renal origin: Secondary | ICD-10-CM | POA: Diagnosis not present

## 2021-03-17 DIAGNOSIS — Z992 Dependence on renal dialysis: Secondary | ICD-10-CM | POA: Diagnosis not present

## 2021-03-17 DIAGNOSIS — N186 End stage renal disease: Secondary | ICD-10-CM | POA: Diagnosis not present

## 2021-03-17 DIAGNOSIS — D631 Anemia in chronic kidney disease: Secondary | ICD-10-CM | POA: Diagnosis not present

## 2021-03-17 DIAGNOSIS — N2581 Secondary hyperparathyroidism of renal origin: Secondary | ICD-10-CM | POA: Diagnosis not present

## 2021-03-17 DIAGNOSIS — D689 Coagulation defect, unspecified: Secondary | ICD-10-CM | POA: Diagnosis not present

## 2021-03-19 DIAGNOSIS — G4733 Obstructive sleep apnea (adult) (pediatric): Secondary | ICD-10-CM | POA: Diagnosis not present

## 2021-03-20 DIAGNOSIS — N186 End stage renal disease: Secondary | ICD-10-CM | POA: Diagnosis not present

## 2021-03-20 DIAGNOSIS — Z992 Dependence on renal dialysis: Secondary | ICD-10-CM | POA: Diagnosis not present

## 2021-03-20 DIAGNOSIS — N2581 Secondary hyperparathyroidism of renal origin: Secondary | ICD-10-CM | POA: Diagnosis not present

## 2021-03-20 DIAGNOSIS — D631 Anemia in chronic kidney disease: Secondary | ICD-10-CM | POA: Diagnosis not present

## 2021-03-20 DIAGNOSIS — D689 Coagulation defect, unspecified: Secondary | ICD-10-CM | POA: Diagnosis not present

## 2021-03-21 DIAGNOSIS — M79652 Pain in left thigh: Secondary | ICD-10-CM | POA: Diagnosis not present

## 2021-03-21 DIAGNOSIS — M25512 Pain in left shoulder: Secondary | ICD-10-CM | POA: Diagnosis not present

## 2021-03-22 DIAGNOSIS — N2581 Secondary hyperparathyroidism of renal origin: Secondary | ICD-10-CM | POA: Diagnosis not present

## 2021-03-22 DIAGNOSIS — D689 Coagulation defect, unspecified: Secondary | ICD-10-CM | POA: Diagnosis not present

## 2021-03-22 DIAGNOSIS — N186 End stage renal disease: Secondary | ICD-10-CM | POA: Diagnosis not present

## 2021-03-22 DIAGNOSIS — Z992 Dependence on renal dialysis: Secondary | ICD-10-CM | POA: Diagnosis not present

## 2021-03-22 DIAGNOSIS — T8612 Kidney transplant failure: Secondary | ICD-10-CM | POA: Diagnosis not present

## 2021-03-22 DIAGNOSIS — D631 Anemia in chronic kidney disease: Secondary | ICD-10-CM | POA: Diagnosis not present

## 2021-03-24 DIAGNOSIS — N2581 Secondary hyperparathyroidism of renal origin: Secondary | ICD-10-CM | POA: Diagnosis not present

## 2021-03-24 DIAGNOSIS — D689 Coagulation defect, unspecified: Secondary | ICD-10-CM | POA: Diagnosis not present

## 2021-03-24 DIAGNOSIS — Z992 Dependence on renal dialysis: Secondary | ICD-10-CM | POA: Diagnosis not present

## 2021-03-24 DIAGNOSIS — N186 End stage renal disease: Secondary | ICD-10-CM | POA: Diagnosis not present

## 2021-03-27 DIAGNOSIS — N2581 Secondary hyperparathyroidism of renal origin: Secondary | ICD-10-CM | POA: Diagnosis not present

## 2021-03-27 DIAGNOSIS — N186 End stage renal disease: Secondary | ICD-10-CM | POA: Diagnosis not present

## 2021-03-27 DIAGNOSIS — D689 Coagulation defect, unspecified: Secondary | ICD-10-CM | POA: Diagnosis not present

## 2021-03-27 DIAGNOSIS — Z992 Dependence on renal dialysis: Secondary | ICD-10-CM | POA: Diagnosis not present

## 2021-03-27 DIAGNOSIS — D631 Anemia in chronic kidney disease: Secondary | ICD-10-CM | POA: Diagnosis not present

## 2021-03-29 ENCOUNTER — Other Ambulatory Visit: Payer: Self-pay | Admitting: Sports Medicine

## 2021-03-29 ENCOUNTER — Other Ambulatory Visit: Payer: Self-pay

## 2021-03-29 ENCOUNTER — Ambulatory Visit
Admission: RE | Admit: 2021-03-29 | Discharge: 2021-03-29 | Disposition: A | Discharge status: Home / Self Care | Payer: Medicare Other | Source: Ambulatory Visit | Attending: Sports Medicine | Admitting: Sports Medicine

## 2021-03-29 DIAGNOSIS — D631 Anemia in chronic kidney disease: Secondary | ICD-10-CM | POA: Diagnosis not present

## 2021-03-29 DIAGNOSIS — M25552 Pain in left hip: Secondary | ICD-10-CM

## 2021-03-29 DIAGNOSIS — D689 Coagulation defect, unspecified: Secondary | ICD-10-CM | POA: Diagnosis not present

## 2021-03-29 DIAGNOSIS — N2581 Secondary hyperparathyroidism of renal origin: Secondary | ICD-10-CM | POA: Diagnosis not present

## 2021-03-29 DIAGNOSIS — Z992 Dependence on renal dialysis: Secondary | ICD-10-CM | POA: Diagnosis not present

## 2021-03-29 DIAGNOSIS — N186 End stage renal disease: Secondary | ICD-10-CM | POA: Diagnosis not present

## 2021-03-31 DIAGNOSIS — D689 Coagulation defect, unspecified: Secondary | ICD-10-CM | POA: Diagnosis not present

## 2021-03-31 DIAGNOSIS — Z992 Dependence on renal dialysis: Secondary | ICD-10-CM | POA: Diagnosis not present

## 2021-03-31 DIAGNOSIS — N186 End stage renal disease: Secondary | ICD-10-CM | POA: Diagnosis not present

## 2021-03-31 DIAGNOSIS — N2581 Secondary hyperparathyroidism of renal origin: Secondary | ICD-10-CM | POA: Diagnosis not present

## 2021-03-31 DIAGNOSIS — D631 Anemia in chronic kidney disease: Secondary | ICD-10-CM | POA: Diagnosis not present

## 2021-04-03 DIAGNOSIS — D631 Anemia in chronic kidney disease: Secondary | ICD-10-CM | POA: Diagnosis not present

## 2021-04-03 DIAGNOSIS — N2581 Secondary hyperparathyroidism of renal origin: Secondary | ICD-10-CM | POA: Diagnosis not present

## 2021-04-03 DIAGNOSIS — Z992 Dependence on renal dialysis: Secondary | ICD-10-CM | POA: Diagnosis not present

## 2021-04-03 DIAGNOSIS — N186 End stage renal disease: Secondary | ICD-10-CM | POA: Diagnosis not present

## 2021-04-03 DIAGNOSIS — D689 Coagulation defect, unspecified: Secondary | ICD-10-CM | POA: Diagnosis not present

## 2021-04-04 ENCOUNTER — Encounter (HOSPITAL_BASED_OUTPATIENT_CLINIC_OR_DEPARTMENT_OTHER): Payer: Self-pay | Admitting: Physical Therapy

## 2021-04-04 ENCOUNTER — Other Ambulatory Visit: Payer: Self-pay

## 2021-04-04 ENCOUNTER — Ambulatory Visit (HOSPITAL_BASED_OUTPATIENT_CLINIC_OR_DEPARTMENT_OTHER): Payer: Medicare Other | Attending: Sports Medicine | Admitting: Physical Therapy

## 2021-04-04 DIAGNOSIS — M6281 Muscle weakness (generalized): Secondary | ICD-10-CM | POA: Diagnosis not present

## 2021-04-04 DIAGNOSIS — M25552 Pain in left hip: Secondary | ICD-10-CM | POA: Diagnosis not present

## 2021-04-04 DIAGNOSIS — R262 Difficulty in walking, not elsewhere classified: Secondary | ICD-10-CM | POA: Diagnosis not present

## 2021-04-04 DIAGNOSIS — M25512 Pain in left shoulder: Secondary | ICD-10-CM | POA: Diagnosis not present

## 2021-04-04 NOTE — Therapy (Signed)
[facility] [address] [place], [place], [address] Phone: [phone]   Fax:  [phone]  Physical Therapy Evaluation  Patient Details  Name: Kimberly [name] MRN: [id number] Date of Birth: Feb 14, 1956 Referring Provider (PT): Inez Catalina, MD   Encounter Date: 04/04/2021   PT End of Session - 04/04/21 1257     Visit Number 1    Number of Visits 9    Date for PT Re-Evaluation 07/03/21    Authorization Type BCBS Medicare    PT Start Time 0930    PT Stop Time 1856    PT Time Calculation (min) 45 min    Activity Tolerance Patient tolerated treatment well    Behavior During Therapy [facility] for tasks assessed/performed             Past Medical History:  Diagnosis Date   Anemia    Asthma    Complication of anesthesia    Slow to awaken and AMS- took 1 month to memory and recall to return   Diabetes mellitus    medication induced with  steroids , 05/19/18 - last A1C was 4.something   Diverticulosis    Dyspnea    with exertion. when walking up stairs or walking a distance   First degree AV block    GERD (gastroesophageal reflux disease)    Heart murmur    child   History of blood transfusion    History of chronic cough    History of hiatal hernia    History of hoarseness    Hypertension    history; resolved on dialysis   Hypothyroidism    IgA nephropathy    ESRD - Hemo TTH Sat   Pneumonia    Premature atrial beat    Premature ventricular beat    Renal transplant failure and rejection 2012   Shingles 03/2011   Stroke [facility]) October 2010, March 2013   cerebral aneurysm, 07/2017    Past Surgical History:  Procedure Laterality Date   AV FISTULA PLACEMENT Left 05/20/2018   Procedure: INSERTION OF ARTERIOVENOUS (AV) GORE-TEX GRAFT LEFT  ARM;  Surgeon: [name], MD;  Location: [facility];  Service: Vascular;  Laterality: Left;   CAPD INSERTION  08/25/2008   open, Dr [name]   CAPD INSERTION N/A  12/14/2013   Procedure: LAPAROSCOPIC INSERTION CONTINUOUS AMBULATORY PERITONEAL DIALYSIS  (CAPD) CATHETER;  Surgeon: [name], MD;  Location: [place];  Service: General;  Laterality: N/A;   CAPD REMOVAL  03/22/2010   Dr [name]   COLONOSCOPY     ESOPHAGOGASTRODUODENOSCOPY     EYE SURGERY Bilateral    radial keratotomy   Hemodialysis catheter Right 02/13/2018   INSERTION OF MESH N/A 12/14/2013   Procedure: INSERTION OF MESH;  Surgeon: [name], MD;  Location: [facility];  Service: General;  Laterality: N/A;   KIDNEY TRANSPLANT Right 01/2010   [facility]   LAPAROSCOPIC LYSIS OF ADHESIONS N/A 12/14/2013   Procedure: LAPAROSCOPIC LYSIS OF ADHESIONS;  Surgeon: [name], MD;  Location: [place];  Service: General;  Laterality: N/A;   TOTAL HIP ARTHROPLASTY Left 02/06/2019   Procedure: TOTAL HIP ARTHROPLASTY ANTERIOR APPROACH;  Surgeon: Rod Can, MD;  Location: [facility];  Service: Orthopedics;  Laterality: Left;   UMBILICAL HERNIA REPAIR  10/25/2009   open with mesh,  Dr [name]   URETER REVISION  04/2011   [facility]   VENTRAL HERNIA REPAIR N/A 12/14/2013   Procedure: LAPAROSCOPIC VENTRAL HERNIA;  Surgeon: [name],  MD;  Location: [facility];  Service: General;  Laterality: N/A;   VIDEO ASSISTED THORACOSCOPY (VATS)/WEDGE RESECTION Left 09/01/2014   Procedure: VIDEO ASSISTED THORACOSCOPY (VATS)/WEDGE RESECTION;  Surgeon: [name], MD;  Location: [facility];  Service: Thoracic;  Laterality: Left;   VIDEO BRONCHOSCOPY Bilateral 07/05/2014   Procedure: VIDEO BRONCHOSCOPY WITH FLUORO;  Surgeon: [name], MD;  Location: [facility];  Service: Cardiopulmonary;  Laterality: Bilateral;    There were no vitals filed for this visit.    Subjective Assessment - 04/04/21 0937     Subjective Pt states she has L shoulder and L hip pain. Pt states the L shoulder pain has been ~93months. Pt joined the gym 21months ago and started doing upper body exercises while using gym equipment. She wanted  to get fit and healthy and sees a trainer here a few times a week.  Pt states that the L shoulder hurts with reaching. Pt denies falls or traumatic MOI. Pt states insidious onset. Pt denies NT into hands. Pt describes pain as sharp and dull in the back of the shoulder. Aggs: reaching, drying hair, sitting in dialysis chair, horizontal ABD; Eases: support, resting. Pt denies crepitus in shoulder. Pt denies warmth in shoulder but it is much larger in size compared to R. Current 0/10, Worst 2/10. L hip surgery THA in May 2020. Pt describes pain in L lateral thigh that feels like a "sprained ankle." Dull achey pain. Pt states L was really sore after water aerobics. She had HH therapy for the L hip but no OP therapy. Pt states she has pain with standing, water aerobics, and too intense of exercise. Pt denies cancer red flags. Pt has a on and off fever "quite often." She will sometimes have a fever after dialysis. MD is aware. Pt denies nausea and vomitting.    Pertinent History CKD, CVA, L THA, anemia, asthma, HTN    How long can you sit comfortably? N/A    How long can you stand comfortably? >1hr    How long can you walk comfortably? <1hr    Patient Stated Goals Pt would like to get rid of the pain in the shoulder, reduce swelling, and set up good home exercise program for gym as well.    Currently in Pain? No/denies    Multiple Pain Sites No                [facility] PT Assessment - 04/04/21 0001       Assessment   Medical Diagnosis M25.512 (ICD-10-CM) - Pain in left shoulder and L hip pain    Referring Provider (PT) Inez Catalina, MD    Hand Dominance Right    Prior Therapy [facility] for L THA      Precautions   Precautions None      Restrictions   Weight Bearing Restrictions No      Balance Screen   Has the patient fallen in the past 6 months No    Has the patient had a decrease in activity level because of a fear of falling?  No    Is the patient reluctant to leave their home because of a  fear of falling?  No      Home Ecologist residence      Prior Function   Level of Independence Independent      Cognition   Overall Cognitive Status Within Functional Limits for tasks assessed      Observation/Other Assessments  Other Surveys  Quick Dash    Quick DASH  50/100 50%      Sensation   Light Touch Appears Intact      Posture/Postural Control   Posture/Postural Control Postural limitations    Postural Limitations Rounded Shoulders;Increased thoracic kyphosis      ROM / Strength   AROM / PROM / Strength AROM;PROM;Strength      AROM   Overall AROM Comments L shoulder flexion and ABD WFL, ER bheind back reach C7, IR T12; R WFL; L hip flexion 115, hip extension 5 deg, IR 30, ER 40      PROM   Overall PROM Comments L and R WFL      Strength   Overall Strength Comments L shoulder 4/5 throughout, R shoulder 4+/5 throughout; L hip 4/5 throughout      Flexibility   Soft Tissue Assessment /Muscle Length yes    Hamstrings limited in supine 90/90    Quadriceps WFL    ITB taut to palpation    Piriformis taut to palpation      Palpation   Palpation comment TTP L UT and infra, no caspular restriction or pattern      Special Tests    Special Tests Rotator Cuff Impingement;Biceps/Labral Tests    Rotator Cuff Impingment tests Michel Bickers test;Empty Can test;Lift- off test;Speed's test;Painful Arc of Motion    Biceps/Labral tests Crank Test      Hawkins-Kennedy test   Findings Negative      Lift-Off test   Findings Negative      Empty Can test   Findings Negative      Speed's test   Findings Negative      Painful Arc of Motion   Findings Negative      Crank Test   Findings Negative      Pronated load test (SLAP)   Findings Negative      Transfers   Five time sit to stand comments  14.1s      Ambulation/Gait   Gait Pattern Step-through pattern;Decreased dorsiflexion - right;Decreased dorsiflexion - left;Decreased  hip/knee flexion - right;Decreased hip/knee flexion - left    Ambulation Surface Unlevel                        Objective measurements completed on examination: See above findings.       [facility] Adult PT Treatment/Exercise - 04/04/21 0001       Exercises   Exercises Shoulder;Knee/Hip      Knee/Hip Exercises: Seated   Sit to Sand 20 reps;Other (comment)   RTB at knees     Shoulder Exercises: Standing   Other Standing Exercises self massage L infra and UT with tennis ball      Shoulder Exercises: Stretch   Cross Chest Stretch Limitations doorway L 90/90 30s 3x    Other Shoulder Stretches sleeper stretch D/C, pain into lateral arm                    PT Education - 04/04/21 1249     Education Details MOI, diagnosis, prognosis, anatomy, exercise progression, DOMS expectations, muscle firing, HEP, exercise precautions, form and exercise technique, POC    Person(s) Educated Patient    Methods Explanation;Demonstration;Tactile cues;Verbal cues;Handout    Comprehension Verbalized understanding;Returned demonstration;Verbal cues required;Tactile cues required                         Plan - 04/04/21  94     Clinical Impression Statement Pt is a 65 y.o. female with CC of L shoulder and L hip pain. Pt presents with limited L shoulder ROM, shoulder and L hip weakness, and shoulder and hip motor control deficits. Pt's s/s appear consistent with L RTC related pain and L hip weakness following THA. Clinical testing does not suggest internal derangement of L shoulder but RTC appears to be irritated due to overuse/overload related injury. L hip likely weak due to lack of rehab and strengthening post surgery. Pt's pain is minimally sensitive and irritable. Pt's impairments limit participation with ADL, exercise, and recreation.  Pt would benefit from continued skilled therapy in order to reach goals and maximize functional  L UE and LE strength and ROM for  full return to PLOF.    Personal Factors and Comorbidities Age;Comorbidity 1;Comorbidity 2;Comorbidity 3+;Time since onset of injury/illness/exacerbation    Examination-Activity Limitations Reach Overhead;Lift;Stand;Stairs;Squat    Examination-Participation Restrictions Occupation;Community Activity;Interpersonal Relationship;Other    Stability/Clinical Decision Making Stable/Uncomplicated    Clinical Decision Making Low    Rehab Potential Good    PT Frequency Biweekly    PT Duration 8 weeks    PT Treatment/Interventions ADLs/Self Care Home Management;Aquatic Therapy;Cryotherapy;Electrical Stimulation;Iontophoresis 4mg /ml Dexamethasone;Moist Heat;Ultrasound;Gait training;Stair training;Functional mobility training;Therapeutic activities;Therapeutic exercise;Balance training;Neuromuscular re-education;Orthotic Fit/Training;Patient/family education;Passive range of motion;Joint Manipulations;Spinal Manipulations;Vasopneumatic Device;Taping;Dry needling;Manual techniques    PT Home Exercise Plan Access Code: [email or web address]    Consulted and Agree with Plan of Care Patient             Patient will benefit from skilled therapeutic intervention in order to improve the following deficits and impairments:  Abnormal gait, Improper body mechanics, Pain, Hypomobility, Decreased strength, Decreased range of motion, Impaired flexibility, Increased muscle spasms, Impaired UE functional use, Decreased endurance  Visit Diagnosis: Left shoulder pain, unspecified chronicity  Pain in left hip  Muscle weakness (generalized)  Difficulty walking     Problem List Patient Active Problem List   Diagnosis Date Noted   Displaced fracture of left femoral neck ([place]) 02/06/2019   Closed left hip fracture, initial encounter ([place]) 02/05/2019   History of stroke 02/05/2019   IgA nephropathy with nephrotic syndrome 10/30/2017   Snoring 10/30/2017   Excessive daytime sleepiness 10/30/2017   Complaint related to  dreams 54/05/8118   Acute embolic stroke ([place]) 14/78/2956   Pre-transplant evaluation for kidney transplant 05/08/2017   Laryngopharyngeal reflux (LPR) 05/29/2016   Cough, persistent 05/29/2016   Chronic cough 09/15/2015   Paroxysmal atrial fibrillation ([place]) 08/21/2015   Acute pericarditis 08/05/2015   Elevated troponin 08/05/2015   Pain in the chest    Atypical chest pain 08/04/2015   LUL cavitary lesion post VATS 2015-cx neg 09/01/2014   Pulmonary infiltrates 07/05/2014   Gross hematuria 06/30/2014   Kidney transplant failure 06/28/2014   Renal failure 06/28/2014   Peritoneal dialysis catheter in place March 2015 01/14/2014   Ventral hernia s/p lap repair w mesh March 2015 01/14/2014   Secondary hyperparathyroidism (of renal origin) 04/20/2013   ESRD (end stage renal disease) ([place]) 10/28/2012   Diarrhea, unspecified 08/10/2012   Urinary tract infection 08/10/2012   Reactive airway disease 03/13/2012   Pulmonary nodule 03/13/2012   Stroke/cerebrovascular accident ([place]) 03/13/2012   Vitamin D deficiency, unspecified 03/13/2012   PVC's (premature ventricular contractions) 01/17/2012   Stroke ([place]) 11/29/2011   DM type 2 causing complication ([place]) 21/30/8657   S/P kidney transplant    IgA nephropathy    GERD (gastroesophageal reflux disease) 12/07/2007  Cough variant asthma vs uacs vs components of both  11/09/2007   Hypothyroidism, unspecified 10/12/2007   ANEMIA 10/12/2007   HTN (hypertension) 10/12/2007   CARDIAC ARRHYTHMIA 10/12/2007   Asthma 10/12/2007   CKD (chronic kidney disease) stage V requiring chronic dialysis ([place]) 10/12/2007   Daleen Bo PT, DPT 04/04/21 1:26 PM   [facility] [place], [place], [address] Phone: [phone]   Fax:  [phone]  Name: Kimberly [name] MRN: [id number] Date of Birth: Jan 10, 1956

## 2021-04-04 NOTE — Patient Instructions (Signed)
Access Code: [email or web address] URL: [email or web address] Date: 04/04/2021 Prepared by: Daleen Bo  Exercises Sit to Stand with Resistance Around Legs - 3-4 x daily - 7 x weekly - 3 sets - 10 reps Single Arm Doorway Pec Stretch at 90 Degrees Abduction - 2 x daily - 7 x weekly - 1 sets - 3 reps - 30 hold Self massage with tennis ball (infra/mid trap) 3 min

## 2021-04-05 DIAGNOSIS — Z992 Dependence on renal dialysis: Secondary | ICD-10-CM | POA: Diagnosis not present

## 2021-04-05 DIAGNOSIS — N2581 Secondary hyperparathyroidism of renal origin: Secondary | ICD-10-CM | POA: Diagnosis not present

## 2021-04-05 DIAGNOSIS — D631 Anemia in chronic kidney disease: Secondary | ICD-10-CM | POA: Diagnosis not present

## 2021-04-05 DIAGNOSIS — N186 End stage renal disease: Secondary | ICD-10-CM | POA: Diagnosis not present

## 2021-04-05 DIAGNOSIS — D689 Coagulation defect, unspecified: Secondary | ICD-10-CM | POA: Diagnosis not present

## 2021-04-07 DIAGNOSIS — N2581 Secondary hyperparathyroidism of renal origin: Secondary | ICD-10-CM | POA: Diagnosis not present

## 2021-04-07 DIAGNOSIS — D631 Anemia in chronic kidney disease: Secondary | ICD-10-CM | POA: Diagnosis not present

## 2021-04-07 DIAGNOSIS — D689 Coagulation defect, unspecified: Secondary | ICD-10-CM | POA: Diagnosis not present

## 2021-04-07 DIAGNOSIS — N186 End stage renal disease: Secondary | ICD-10-CM | POA: Diagnosis not present

## 2021-04-07 DIAGNOSIS — Z992 Dependence on renal dialysis: Secondary | ICD-10-CM | POA: Diagnosis not present

## 2021-04-07 DIAGNOSIS — E1129 Type 2 diabetes mellitus with other diabetic kidney complication: Secondary | ICD-10-CM | POA: Diagnosis not present

## 2021-04-07 DIAGNOSIS — E039 Hypothyroidism, unspecified: Secondary | ICD-10-CM | POA: Diagnosis not present

## 2021-04-10 DIAGNOSIS — N186 End stage renal disease: Secondary | ICD-10-CM | POA: Diagnosis not present

## 2021-04-10 DIAGNOSIS — D689 Coagulation defect, unspecified: Secondary | ICD-10-CM | POA: Diagnosis not present

## 2021-04-10 DIAGNOSIS — D631 Anemia in chronic kidney disease: Secondary | ICD-10-CM | POA: Diagnosis not present

## 2021-04-10 DIAGNOSIS — N2581 Secondary hyperparathyroidism of renal origin: Secondary | ICD-10-CM | POA: Diagnosis not present

## 2021-04-10 DIAGNOSIS — Z992 Dependence on renal dialysis: Secondary | ICD-10-CM | POA: Diagnosis not present

## 2021-04-11 DIAGNOSIS — G4733 Obstructive sleep apnea (adult) (pediatric): Secondary | ICD-10-CM | POA: Diagnosis not present

## 2021-04-12 DIAGNOSIS — N186 End stage renal disease: Secondary | ICD-10-CM | POA: Diagnosis not present

## 2021-04-12 DIAGNOSIS — Z992 Dependence on renal dialysis: Secondary | ICD-10-CM | POA: Diagnosis not present

## 2021-04-12 DIAGNOSIS — D631 Anemia in chronic kidney disease: Secondary | ICD-10-CM | POA: Diagnosis not present

## 2021-04-12 DIAGNOSIS — N2581 Secondary hyperparathyroidism of renal origin: Secondary | ICD-10-CM | POA: Diagnosis not present

## 2021-04-12 DIAGNOSIS — D689 Coagulation defect, unspecified: Secondary | ICD-10-CM | POA: Diagnosis not present

## 2021-04-14 DIAGNOSIS — D689 Coagulation defect, unspecified: Secondary | ICD-10-CM | POA: Diagnosis not present

## 2021-04-14 DIAGNOSIS — D631 Anemia in chronic kidney disease: Secondary | ICD-10-CM | POA: Diagnosis not present

## 2021-04-14 DIAGNOSIS — N186 End stage renal disease: Secondary | ICD-10-CM | POA: Diagnosis not present

## 2021-04-14 DIAGNOSIS — N2581 Secondary hyperparathyroidism of renal origin: Secondary | ICD-10-CM | POA: Diagnosis not present

## 2021-04-14 DIAGNOSIS — Z992 Dependence on renal dialysis: Secondary | ICD-10-CM | POA: Diagnosis not present

## 2021-04-16 ENCOUNTER — Ambulatory Visit (HOSPITAL_BASED_OUTPATIENT_CLINIC_OR_DEPARTMENT_OTHER): Payer: Medicare Other | Admitting: Physical Therapy

## 2021-04-16 ENCOUNTER — Other Ambulatory Visit: Payer: Self-pay

## 2021-04-16 ENCOUNTER — Encounter (HOSPITAL_BASED_OUTPATIENT_CLINIC_OR_DEPARTMENT_OTHER): Payer: Self-pay | Admitting: Physical Therapy

## 2021-04-16 DIAGNOSIS — R262 Difficulty in walking, not elsewhere classified: Secondary | ICD-10-CM | POA: Diagnosis not present

## 2021-04-16 DIAGNOSIS — M25512 Pain in left shoulder: Secondary | ICD-10-CM | POA: Diagnosis not present

## 2021-04-16 DIAGNOSIS — M6281 Muscle weakness (generalized): Secondary | ICD-10-CM | POA: Diagnosis not present

## 2021-04-16 DIAGNOSIS — M25552 Pain in left hip: Secondary | ICD-10-CM | POA: Diagnosis not present

## 2021-04-16 NOTE — Therapy (Signed)
[facility] [address] [place], [place], [address] Phone: [phone]   Fax:  [phone]  Physical Therapy Treatment  Patient Details  Name: Kimberly [name] MRN: [id number] Date of Birth: 07/04/1956 Referring Provider (PT): Inez Catalina, MD   Encounter Date: 04/16/2021   PT End of Session - 04/16/21 0927     Visit Number 2    Number of Visits 9    Date for PT Re-Evaluation 07/03/21    Authorization Type BCBS Medicare    PT Start Time 0850    PT Stop Time 0930    PT Time Calculation (min) 40 min    Activity Tolerance Patient tolerated treatment well    Behavior During Therapy [facility] for tasks assessed/performed             Past Medical History:  Diagnosis Date   Anemia    Asthma    Complication of anesthesia    Slow to awaken and AMS- took 1 month to memory and recall to return   Diabetes mellitus    medication induced with  steroids , 05/19/18 - last A1C was 4.something   Diverticulosis    Dyspnea    with exertion. when walking up stairs or walking a distance   First degree AV block    GERD (gastroesophageal reflux disease)    Heart murmur    child   History of blood transfusion    History of chronic cough    History of hiatal hernia    History of hoarseness    Hypertension    history; resolved on dialysis   Hypothyroidism    IgA nephropathy    ESRD - Hemo TTH Sat   Pneumonia    Premature atrial beat    Premature ventricular beat    Renal transplant failure and rejection 2012   Shingles 03/2011   Stroke [facility]) October 2010, March 2013   cerebral aneurysm, 07/2017    Past Surgical History:  Procedure Laterality Date   AV FISTULA PLACEMENT Left 05/20/2018   Procedure: INSERTION OF ARTERIOVENOUS (AV) GORE-TEX GRAFT LEFT  ARM;  Surgeon: [name], MD;  Location: [place];  Service: Vascular;  Laterality: Left;   CAPD INSERTION  08/25/2008   open, Dr [name]   CAPD INSERTION N/A  12/14/2013   Procedure: LAPAROSCOPIC INSERTION CONTINUOUS AMBULATORY PERITONEAL DIALYSIS  (CAPD) CATHETER;  Surgeon: [name], MD;  Location: [place];  Service: General;  Laterality: N/A;   CAPD REMOVAL  03/22/2010   Dr [name]   COLONOSCOPY     ESOPHAGOGASTRODUODENOSCOPY     EYE SURGERY Bilateral    radial keratotomy   Hemodialysis catheter Right 02/13/2018   INSERTION OF MESH N/A 12/14/2013   Procedure: INSERTION OF MESH;  Surgeon: [name], MD;  Location: [place];  Service: General;  Laterality: N/A;   KIDNEY TRANSPLANT Right 01/2010   [facility]   LAPAROSCOPIC LYSIS OF ADHESIONS N/A 12/14/2013   Procedure: LAPAROSCOPIC LYSIS OF ADHESIONS;  Surgeon: [name], MD;  Location: [place];  Service: General;  Laterality: N/A;   TOTAL HIP ARTHROPLASTY Left 02/06/2019   Procedure: TOTAL HIP ARTHROPLASTY ANTERIOR APPROACH;  Surgeon: Rod Can, MD;  Location: [place];  Service: Orthopedics;  Laterality: Left;   UMBILICAL HERNIA REPAIR  10/25/2009   open with mesh,  Dr [name]   URETER REVISION  04/2011   [facility]   VENTRAL HERNIA REPAIR N/A 12/14/2013   Procedure: LAPAROSCOPIC VENTRAL HERNIA;  Surgeon: [name],  MD;  Location: [place];  Service: General;  Laterality: N/A;   VIDEO ASSISTED THORACOSCOPY (VATS)/WEDGE RESECTION Left 09/01/2014   Procedure: VIDEO ASSISTED THORACOSCOPY (VATS)/WEDGE RESECTION;  Surgeon: [name], MD;  Location: [place];  Service: Thoracic;  Laterality: Left;   VIDEO BRONCHOSCOPY Bilateral 07/05/2014   Procedure: VIDEO BRONCHOSCOPY WITH FLUORO;  Surgeon: [name], MD;  [facility];  Service: Cardiopulmonary;  Laterality: Bilateral;    There were no vitals filed for this visit.   Subjective Assessment - 04/16/21 0851     Subjective Pt states the shoulder "could be better." She states putting her hand behind her head and reaching OH to wash hair are still pain. Pt states the L hip pain comes and goes. She states standing on that leg  while doing dishes caused some more discomfort when offweight.    Pertinent History CKD, CVA, L THA, anemia, asthma, HTN    How long can you sit comfortably? N/A    How long can you stand comfortably? >1hr    How long can you walk comfortably? <1hr    Patient Stated Goals Pt would like to get rid of the pain in the shoulder, reduce swelling, and set up good home exercise program for gym as well.    Currently in Pain? Yes    Pain Score 1     Pain Location Shoulder    Pain Orientation Left    Pain Descriptors / Indicators Aching;Sore                               [facility] Adult PT Treatment/Exercise - 04/16/21 0001                Ambulation/Gait   Gait Pattern Step-through pattern;Decreased dorsiflexion - right;Decreased dorsiflexion - left;Decreased hip/knee flexion - right;Decreased hip/knee flexion - left      Posture/Postural Control   Posture/Postural Control Postural limitations    Postural Limitations Rounded Shoulders;Increased thoracic kyphosis      Exercises   Exercises Shoulder;Knee/Hip      Knee/Hip Exercises: Stretches   Piriformis Stretch Limitations seated figure 4 30s 3x      Knee/Hip Exercises: Seated   Other Seated Knee/Hip Exercises seated self massage L hip in figure 4    Sit to Sand 20 reps;Other (comment)   GTB at knees     Shoulder Exercises: Standing   Other Standing Exercises self massage L infra and UT with tennis ball    Other Standing Exercises cane scaption 2x10 3s hold      Shoulder Exercises: Stretch   Cross Chest Stretch Limitations doorway L 90/90 30s 3x    Other Shoulder Stretches sleeper stretch D/C, pain into lateral arm      Manual Therapy   Manual Therapy Joint mobilization;Soft tissue mobilization    Joint Mobilization grade III inf and post GHJ mob                    PT Education - 04/16/21 0925     Education Details anatomy, exercise progression, DOMS expectations, muscle firing, HEP, exercise  precautions, form and exercise technique, POC, posture    Person(s) Educated Patient    Methods Explanation;Demonstration;Verbal cues;Tactile cues;Handout    Comprehension Verbal cues required;Verbalized understanding;Returned demonstration;Tactile cues required                        Plan - 04/16/21 3762  Clinical Impression Statement Pt presented at today's session with increased L infra and UT hypertonicity as well as L hip gluteal trigger points. Pt has improved joint mobility and ROM following session. Pt has pain with AROM going OH so AAROM was introduced and emphasis placed on eccentric lowering strength for L RTC. L hip appears mobility limited and strength limited at this time with minimally irritable symptoms. Introduced self massage and streching for home as part of self recovery program. Pt gave verbal understanding to edu regarding pacing of activity and graded exposure to exercise activity in gym with fitness classes. Pt would benefit from continued skilled therapy in order to reach goals and maximize functional L UE and LE strength and ROM for full return to PLOF.    Personal Factors and Comorbidities Age;Comorbidity 1;Comorbidity 2;Comorbidity 3+;Time since onset of injury/illness/exacerbation    Examination-Activity Limitations Reach Overhead;Lift;Stand;Stairs;Squat    Examination-Participation Restrictions Occupation;Community Activity;Interpersonal Relationship;Other    Stability/Clinical Decision Making Stable/Uncomplicated    Rehab Potential Good    PT Frequency Biweekly    PT Duration 8 weeks    PT Treatment/Interventions ADLs/Self Care Home Management;Aquatic Therapy;Cryotherapy;Electrical Stimulation;Iontophoresis 4mg /ml Dexamethasone;Moist Heat;Ultrasound;Gait training;Stair training;Functional mobility training;Therapeutic activities;Therapeutic exercise;Balance training;Neuromuscular re-education;Orthotic Fit/Training;Patient/family education;Passive range  of motion;Joint Manipulations;Spinal Manipulations;Vasopneumatic Device;Taping;Dry needling;Manual techniques    PT Home Exercise Plan Access Code: [email or web address]    Consulted and Agree with Plan of Care Patient             Patient will benefit from skilled therapeutic intervention in order to improve the following deficits and impairments:  Abnormal gait, Improper body mechanics, Pain, Hypomobility, Decreased strength, Decreased range of motion, Impaired flexibility, Increased muscle spasms, Impaired UE functional use, Decreased endurance  Visit Diagnosis: Left shoulder pain, unspecified chronicity  Pain in left hip  Muscle weakness (generalized)  Difficulty walking     Problem List Patient Active Problem List   Diagnosis Date Noted   Displaced fracture of left femoral neck ([place]) 02/06/2019   Closed left hip fracture, initial encounter ([place]) 02/05/2019   History of stroke 02/05/2019   IgA nephropathy with nephrotic syndrome 10/30/2017   Snoring 10/30/2017   Excessive daytime sleepiness 10/30/2017   Complaint related to dreams 47/82/9562   Acute embolic stroke ([place]) 13/04/6577   Pre-transplant evaluation for kidney transplant 05/08/2017   Laryngopharyngeal reflux (LPR) 05/29/2016   Cough, persistent 05/29/2016   Chronic cough 09/15/2015   Paroxysmal atrial fibrillation ([place]) 08/21/2015   Acute pericarditis 08/05/2015   Elevated troponin 08/05/2015   Pain in the chest    Atypical chest pain 08/04/2015   LUL cavitary lesion post VATS 2015-cx neg 09/01/2014   Pulmonary infiltrates 07/05/2014   Gross hematuria 06/30/2014   Kidney transplant failure 06/28/2014   Renal failure 06/28/2014   Peritoneal dialysis catheter in place March 2015 01/14/2014   Ventral hernia s/p lap repair w mesh March 2015 01/14/2014   Secondary hyperparathyroidism (of renal origin) 04/20/2013   ESRD (end stage renal disease) ([place]) 10/28/2012   Diarrhea, unspecified 08/10/2012   Urinary tract  infection 08/10/2012   Reactive airway disease 03/13/2012   Pulmonary nodule 03/13/2012   Stroke/cerebrovascular accident ([place]) 03/13/2012   Vitamin D deficiency, unspecified 03/13/2012   PVC's (premature ventricular contractions) 01/17/2012   Stroke ([place]) 11/29/2011   DM type 2 causing complication ([place]) 46/96/2952   S/P kidney transplant    IgA nephropathy    GERD (gastroesophageal reflux disease) 12/07/2007   Cough variant asthma vs uacs vs components of both  11/09/2007   Hypothyroidism,  unspecified 10/12/2007   ANEMIA 10/12/2007   HTN (hypertension) 10/12/2007   CARDIAC ARRHYTHMIA 10/12/2007   Asthma 10/12/2007   CKD (chronic kidney disease) stage V requiring chronic dialysis ([place]) 10/12/2007   Daleen Bo PT, DPT 04/16/21 9:37 AM   [facility] [address] [place], [place], [address] Phone: [phone]   Fax:  [phone]  Name: Kimberly [name] MRN: [id number] Date of Birth: 01-Apr-1956

## 2021-04-16 NOTE — Patient Instructions (Signed)
Access Code: [email or web address] URL: [email or web address] Date: 04/16/2021 Prepared by: Daleen Bo  Exercises Sit to Stand with Resistance Around Legs - 3-4 x daily - 7 x weekly - 3 sets - 10 reps Single Arm Doorway Pec Stretch at 90 Degrees Abduction - 2 x daily - 7 x weekly - 1 sets - 3 reps - 30 hold Seated Figure 4 Piriformis Stretch - 2 x daily - 7 x weekly - 1 sets - 3 reps - 30 hold Shoulder Scaption AAROM with Dowel - 2 x daily - 7 x weekly - 2 sets - 10 reps

## 2021-04-17 DIAGNOSIS — N2581 Secondary hyperparathyroidism of renal origin: Secondary | ICD-10-CM | POA: Diagnosis not present

## 2021-04-17 DIAGNOSIS — D689 Coagulation defect, unspecified: Secondary | ICD-10-CM | POA: Diagnosis not present

## 2021-04-17 DIAGNOSIS — D631 Anemia in chronic kidney disease: Secondary | ICD-10-CM | POA: Diagnosis not present

## 2021-04-17 DIAGNOSIS — N186 End stage renal disease: Secondary | ICD-10-CM | POA: Diagnosis not present

## 2021-04-17 DIAGNOSIS — Z992 Dependence on renal dialysis: Secondary | ICD-10-CM | POA: Diagnosis not present

## 2021-04-19 DIAGNOSIS — N2581 Secondary hyperparathyroidism of renal origin: Secondary | ICD-10-CM | POA: Diagnosis not present

## 2021-04-19 DIAGNOSIS — D631 Anemia in chronic kidney disease: Secondary | ICD-10-CM | POA: Diagnosis not present

## 2021-04-19 DIAGNOSIS — N186 End stage renal disease: Secondary | ICD-10-CM | POA: Diagnosis not present

## 2021-04-19 DIAGNOSIS — D689 Coagulation defect, unspecified: Secondary | ICD-10-CM | POA: Diagnosis not present

## 2021-04-19 DIAGNOSIS — Z992 Dependence on renal dialysis: Secondary | ICD-10-CM | POA: Diagnosis not present

## 2021-04-21 DIAGNOSIS — D631 Anemia in chronic kidney disease: Secondary | ICD-10-CM | POA: Diagnosis not present

## 2021-04-21 DIAGNOSIS — D689 Coagulation defect, unspecified: Secondary | ICD-10-CM | POA: Diagnosis not present

## 2021-04-21 DIAGNOSIS — N2581 Secondary hyperparathyroidism of renal origin: Secondary | ICD-10-CM | POA: Diagnosis not present

## 2021-04-21 DIAGNOSIS — Z992 Dependence on renal dialysis: Secondary | ICD-10-CM | POA: Diagnosis not present

## 2021-04-21 DIAGNOSIS — N186 End stage renal disease: Secondary | ICD-10-CM | POA: Diagnosis not present

## 2021-04-22 DIAGNOSIS — T8612 Kidney transplant failure: Secondary | ICD-10-CM | POA: Diagnosis not present

## 2021-04-22 DIAGNOSIS — Z992 Dependence on renal dialysis: Secondary | ICD-10-CM | POA: Diagnosis not present

## 2021-04-22 DIAGNOSIS — N186 End stage renal disease: Secondary | ICD-10-CM | POA: Diagnosis not present

## 2021-04-24 DIAGNOSIS — D631 Anemia in chronic kidney disease: Secondary | ICD-10-CM | POA: Diagnosis not present

## 2021-04-24 DIAGNOSIS — Z992 Dependence on renal dialysis: Secondary | ICD-10-CM | POA: Diagnosis not present

## 2021-04-24 DIAGNOSIS — D689 Coagulation defect, unspecified: Secondary | ICD-10-CM | POA: Diagnosis not present

## 2021-04-24 DIAGNOSIS — N186 End stage renal disease: Secondary | ICD-10-CM | POA: Diagnosis not present

## 2021-04-24 DIAGNOSIS — N2581 Secondary hyperparathyroidism of renal origin: Secondary | ICD-10-CM | POA: Diagnosis not present

## 2021-04-26 DIAGNOSIS — N186 End stage renal disease: Secondary | ICD-10-CM | POA: Diagnosis not present

## 2021-04-26 DIAGNOSIS — N2581 Secondary hyperparathyroidism of renal origin: Secondary | ICD-10-CM | POA: Diagnosis not present

## 2021-04-26 DIAGNOSIS — D631 Anemia in chronic kidney disease: Secondary | ICD-10-CM | POA: Diagnosis not present

## 2021-04-26 DIAGNOSIS — Z992 Dependence on renal dialysis: Secondary | ICD-10-CM | POA: Diagnosis not present

## 2021-04-26 DIAGNOSIS — D689 Coagulation defect, unspecified: Secondary | ICD-10-CM | POA: Diagnosis not present

## 2021-04-26 DIAGNOSIS — G4733 Obstructive sleep apnea (adult) (pediatric): Secondary | ICD-10-CM | POA: Diagnosis not present

## 2021-04-28 DIAGNOSIS — D631 Anemia in chronic kidney disease: Secondary | ICD-10-CM | POA: Diagnosis not present

## 2021-04-28 DIAGNOSIS — N2581 Secondary hyperparathyroidism of renal origin: Secondary | ICD-10-CM | POA: Diagnosis not present

## 2021-04-28 DIAGNOSIS — N186 End stage renal disease: Secondary | ICD-10-CM | POA: Diagnosis not present

## 2021-04-28 DIAGNOSIS — D689 Coagulation defect, unspecified: Secondary | ICD-10-CM | POA: Diagnosis not present

## 2021-04-28 DIAGNOSIS — Z992 Dependence on renal dialysis: Secondary | ICD-10-CM | POA: Diagnosis not present

## 2021-05-01 DIAGNOSIS — D631 Anemia in chronic kidney disease: Secondary | ICD-10-CM | POA: Diagnosis not present

## 2021-05-01 DIAGNOSIS — D689 Coagulation defect, unspecified: Secondary | ICD-10-CM | POA: Diagnosis not present

## 2021-05-01 DIAGNOSIS — N2581 Secondary hyperparathyroidism of renal origin: Secondary | ICD-10-CM | POA: Diagnosis not present

## 2021-05-01 DIAGNOSIS — R52 Pain, unspecified: Secondary | ICD-10-CM | POA: Diagnosis not present

## 2021-05-01 DIAGNOSIS — Z992 Dependence on renal dialysis: Secondary | ICD-10-CM | POA: Diagnosis not present

## 2021-05-01 DIAGNOSIS — E039 Hypothyroidism, unspecified: Secondary | ICD-10-CM | POA: Diagnosis not present

## 2021-05-01 DIAGNOSIS — N186 End stage renal disease: Secondary | ICD-10-CM | POA: Diagnosis not present

## 2021-05-02 ENCOUNTER — Other Ambulatory Visit: Payer: Self-pay

## 2021-05-02 ENCOUNTER — Ambulatory Visit (HOSPITAL_BASED_OUTPATIENT_CLINIC_OR_DEPARTMENT_OTHER): Payer: Medicare Other | Attending: Sports Medicine | Admitting: Physical Therapy

## 2021-05-02 ENCOUNTER — Encounter (HOSPITAL_BASED_OUTPATIENT_CLINIC_OR_DEPARTMENT_OTHER): Payer: Self-pay | Admitting: Physical Therapy

## 2021-05-02 DIAGNOSIS — M25512 Pain in left shoulder: Secondary | ICD-10-CM

## 2021-05-02 DIAGNOSIS — R262 Difficulty in walking, not elsewhere classified: Secondary | ICD-10-CM

## 2021-05-02 DIAGNOSIS — M6281 Muscle weakness (generalized): Secondary | ICD-10-CM | POA: Diagnosis not present

## 2021-05-02 DIAGNOSIS — M25552 Pain in left hip: Secondary | ICD-10-CM

## 2021-05-02 NOTE — Therapy (Signed)
[facility] [address] [place], [place], [address] Phone: [phone]   Fax:  [phone]  Physical Therapy Treatment  Patient Details  Name: Kimberly [name] MRN: [id number] Date of Birth: [birth year] Referring Provider (PT): Inez Catalina, MD   Encounter Date: 05/02/2021   PT End of Session - 05/02/21 1001     Visit Number 3    Number of Visits 9    Date for PT Re-Evaluation 07/03/21    Authorization Type BCBS Medicare    PT Start Time [phone]   pt arrives late   PT Stop Time 1015    PT Time Calculation (min) 39 min    Activity Tolerance Patient tolerated treatment well    Behavior During Therapy [facility] for tasks assessed/performed             Past Medical History:  Diagnosis Date   Anemia    Asthma    Complication of anesthesia    Slow to awaken and AMS- took 1 month to memory and recall to return   Diabetes mellitus    medication induced with  steroids , 05/19/18 - last A1C was 4.something   Diverticulosis    Dyspnea    with exertion. when walking up stairs or walking a distance   First degree AV block    GERD (gastroesophageal reflux disease)    Heart murmur    child   History of blood transfusion    History of chronic cough    History of hiatal hernia    History of hoarseness    Hypertension    history; resolved on dialysis   Hypothyroidism    IgA nephropathy    ESRD - Hemo TTH Sat   Pneumonia    Premature atrial beat    Premature ventricular beat    Renal transplant failure and rejection 2012   Shingles 03/2011   Stroke [facility]) October 2010, March 2013   cerebral aneurysm, 07/2017    Past Surgical History:  Procedure Laterality Date   AV FISTULA PLACEMENT Left 05/20/2018   Procedure: INSERTION OF ARTERIOVENOUS (AV) GORE-TEX GRAFT LEFT  ARM;  Surgeon: [name], MD;  Location: [place];  Service: Vascular;  Laterality: Left;   CAPD INSERTION  08/25/2008   open, Dr [name]   CAPD  INSERTION N/A 12/14/2013   Procedure: LAPAROSCOPIC INSERTION CONTINUOUS AMBULATORY PERITONEAL DIALYSIS  (CAPD) CATHETER;  Surgeon: [name], MD;  Location: [place];  Service: General;  Laterality: N/A;   CAPD REMOVAL  03/22/2010   Dr [name]   COLONOSCOPY     ESOPHAGOGASTRODUODENOSCOPY     EYE SURGERY Bilateral    radial keratotomy   Hemodialysis catheter Right 02/13/2018   INSERTION OF MESH N/A 12/14/2013   Procedure: INSERTION OF MESH;  Surgeon: [name], MD;  Location: [place];  Service: General;  Laterality: N/A;   KIDNEY TRANSPLANT Right 01/2010   [facility]   LAPAROSCOPIC LYSIS OF ADHESIONS N/A 12/14/2013   Procedure: LAPAROSCOPIC LYSIS OF ADHESIONS;  Surgeon: [name], MD;  Location: [place];  Service: General;  Laterality: N/A;   TOTAL HIP ARTHROPLASTY Left 02/06/2019   Procedure: TOTAL HIP ARTHROPLASTY ANTERIOR APPROACH;  Surgeon: Rod Can, MD;  Location: [place];  Service: Orthopedics;  Laterality: Left;   UMBILICAL HERNIA REPAIR  10/25/2009   open with mesh,  Dr [name]   URETER REVISION  04/2011   [facility]   VENTRAL HERNIA REPAIR N/A 12/14/2013   Procedure: LAPAROSCOPIC VENTRAL HERNIA;  Surgeon: [name], MD;  Location: [place];  Service: General;  Laterality: N/A;   VIDEO ASSISTED THORACOSCOPY (VATS)/WEDGE RESECTION Left 09/01/2014   Procedure: VIDEO ASSISTED THORACOSCOPY (VATS)/WEDGE RESECTION;  Surgeon: [name], MD;  Location: [place];  Service: Thoracic;  Laterality: Left;   VIDEO BRONCHOSCOPY Bilateral 07/05/2014   Procedure: VIDEO BRONCHOSCOPY WITH FLUORO;  Surgeon: [name], MD;  [facility]: [facility];  Service: Cardiopulmonary;  Laterality: Bilateral;    There were no vitals filed for this visit.   Subjective Assessment - 05/02/21 0939     Subjective Pt states that the shoulder hurt while doing dialysis due to positioning. Washing hair astill hurts but she did feel better after last session. She states that tennis ball massage makes it  feel better. Pt saw Dr. Sheppard Coil this morning. She states she got an injection this morning and had the joint aspirated.    Pertinent History CKD, CVA, L THA, anemia, asthma, HTN    How long can you sit comfortably? N/A    How long can you stand comfortably? >1hr    How long can you walk comfortably? <1hr    Patient Stated Goals Pt would like to get rid of the pain in the shoulder, reduce swelling, and set up good home exercise program for gym as well.    Currently in Pain? No/denies    Pain Score 0-No pain                 [facility] Adult PT Treatment/Exercise - 05/02/21 0001                Ambulation/Gait   Gait Pattern Step-through pattern;Decreased dorsiflexion - right;Decreased dorsiflexion - left;Decreased hip/knee flexion - right;Decreased hip/knee flexion - left      Posture/Postural Control   Posture/Postural Control Postural limitations    Postural Limitations Rounded Shoulders;Increased thoracic kyphosis      Exercises   Exercises Shoulder;Knee/Hip      Knee/Hip Exercises: Stretches   Piriformis Stretch Limitations seated figure 4 30s 3x      Knee/Hip Exercises: Seated       Sit to Sand 20 reps;Other (comment)   GTB at knees     Shoulder Exercises: Seated   Retraction Limitations 20x and 20x posterior rolls    External Rotation Limitations no resistance 20x      Shoulder Exercises: Standing   Flexion Limitations AROM no resistance 2x10        Other Standing Exercises standing ABD thumbs up 2x10      Shoulder Exercises: ROM/Strengthening   Wall Wash 20x      Shoulder Exercises: Stretch   Cross Chest Stretch Limitations doorway pec stretch at 60 deg 30s 3x          Manual Therapy   Manual Therapy Joint mobilization;Soft tissue mobilization    Joint Mobilization grade III inf and post GHJ mob    Soft tissue mobilization L infra                    PT Education - 05/02/21 0957     Education Details muscle firing, HEP, exercise precautions,  form and exercise technique, POC, posture, injection precautions    Person(s) Educated Patient    Methods Explanation;Demonstration;Tactile cues;Verbal cues;Handout    Comprehension Verbalized understanding;Returned demonstration;Verbal cues required;Tactile cues required              PT Short Term Goals - 05/02/21 1013       PT  SHORT TERM GOAL #1   Title Pt will become independent with HEP in order to demonstrate synthesis of PT education.    Time 4    Period Weeks    Status New      PT SHORT TERM GOAL #2   Title Pt will have an at least 16 pt improvement in Quick DASH measure in order to demonstrate MCID improvement in daily function.    Time 4    Period Weeks    Status New      PT SHORT TERM GOAL #3   Title Pt will be able to demonstrate full OH reach and behind back reach in order to demonstrate functional improvement in UE function for self-care and house hold duties.    Time 4    Period Weeks    Status New               PT Long Term Goals - 05/02/21 1019       PT LONG TERM GOAL #1   Title Pt  will become independent with final HEP in order to demonstrate synthesis of PT education.    Time 8    Period Weeks    Status New      PT LONG TERM GOAL #2   Title Pt will be able to perform 5XSTS in under 12s  in order to demonstrate functional improvement above the cut off score for adults.      PT LONG TERM GOAL #3   Title Pt will be able to reach [facility] and grab/reach/hold >5 lbs in order to demonstrate functional improvement in L/R UE function.    Time 8    Period Weeks    Status New                   Plan - 05/02/21 1004     Clinical Impression Statement Pt with steroid injection this morning after MD visit. Session was focused on AROM without resistance for joint protection following steroid injection and aspiration. Pt able to tolerate AROM and stretchign exercise without pain. However, pt did did require signficant VC and TC for reduce end range UT  compensation. Pt had improve soft tissue extensibility within L shoulder following manual therapy. Pt was advised about gym exercise routine and to not overly stress L shoulder soft tissue for at least 48-72 hours.  Pt gave verbal understanding to edu regarding precautions after steriod injection. Pt would benefit from continued skilled therapy in order to reach goals and maximize functional L UE and LE strength and ROM for full return to PLOF.    Personal Factors and Comorbidities Age;Comorbidity 1;Comorbidity 2;Comorbidity 3+;Time since onset of injury/illness/exacerbation    Examination-Activity Limitations Reach Overhead;Lift;Stand;Stairs;Squat    Examination-Participation Restrictions Occupation;Community Activity;Interpersonal Relationship;Other    Stability/Clinical Decision Making Stable/Uncomplicated    Rehab Potential Good    PT Frequency Biweekly    PT Duration 8 weeks    PT Treatment/Interventions ADLs/Self Care Home Management;Aquatic Therapy;Cryotherapy;Electrical Stimulation;Iontophoresis 4mg /ml Dexamethasone;Moist Heat;Ultrasound;Gait training;Stair training;Functional mobility training;Therapeutic activities;Therapeutic exercise;Balance training;Neuromuscular re-education;Orthotic Fit/Training;Patient/family education;Passive range of motion;Joint Manipulations;Spinal Manipulations;Vasopneumatic Device;Taping;Dry needling;Manual techniques    PT Home Exercise Plan Access Code: [email or web address]    Consulted and Agree with Plan of Care Patient             Patient will benefit from skilled therapeutic intervention in order to improve the following deficits and impairments:  Abnormal gait, Improper body mechanics, Pain, Hypomobility, Decreased strength, Decreased range of motion, Impaired flexibility, Increased muscle  spasms, Impaired UE functional use, Decreased endurance  Visit Diagnosis: Left shoulder pain, unspecified chronicity  Pain in left hip  Muscle weakness  (generalized)  Difficulty walking     Problem List Patient Active Problem List   Diagnosis Date Noted   Displaced fracture of left femoral neck ([place]) 02/06/2019   Closed left hip fracture, initial encounter ([place]) 02/05/2019   History of stroke 02/05/2019   IgA nephropathy with nephrotic syndrome 10/30/2017   Snoring 10/30/2017   Excessive daytime sleepiness 10/30/2017   Complaint related to dreams 39/11/90   Acute embolic stroke ([place]) 33/00/7622   Pre-transplant evaluation for kidney transplant 05/08/2017   Laryngopharyngeal reflux (LPR) 05/29/2016   Cough, persistent 05/29/2016   Chronic cough 09/15/2015   Paroxysmal atrial fibrillation ([place]) 08/21/2015   Acute pericarditis 08/05/2015   Elevated troponin 08/05/2015   Pain in the chest    Atypical chest pain 08/04/2015   LUL cavitary lesion post VATS 2015-cx neg 09/01/2014   Pulmonary infiltrates 07/05/2014   Gross hematuria 06/30/2014   Kidney transplant failure 06/28/2014   Renal failure 06/28/2014   Peritoneal dialysis catheter in place March 2015 01/14/2014   Ventral hernia s/p lap repair w mesh March 2015 01/14/2014   Secondary hyperparathyroidism (of renal origin) 04/20/2013   ESRD (end stage renal disease) ([place]) 10/28/2012   Diarrhea, unspecified 08/10/2012   Urinary tract infection 08/10/2012   Reactive airway disease 03/13/2012   Pulmonary nodule 03/13/2012   Stroke/cerebrovascular accident ([place]) 03/13/2012   Vitamin D deficiency, unspecified 03/13/2012   PVC's (premature ventricular contractions) 01/17/2012   Stroke ([place]) 11/29/2011   DM type 2 causing complication ([place]) 63/33/5456   S/P kidney transplant    IgA nephropathy    GERD (gastroesophageal reflux disease) 12/07/2007   Cough variant asthma vs uacs vs components of both  11/09/2007   Hypothyroidism, unspecified 10/12/2007   ANEMIA 10/12/2007   HTN (hypertension) 10/12/2007   CARDIAC ARRHYTHMIA 10/12/2007   Asthma 10/12/2007   CKD (chronic  kidney disease) stage V requiring chronic dialysis ([place]) 10/12/2007   Daleen Bo PT, DPT 05/02/21 12:54 PM   [facility] [address] [place], [place], [address] Phone: [phone]   Fax:  [phone]  Name: Doylene Splinter [place] MRN: [id number] Date of Birth: [birth year]

## 2021-05-02 NOTE — Patient Instructions (Signed)
Access Code: [email or web address] URL: [email or web address] Date: 05/02/2021 Prepared by: Daleen Bo  Exercises Sit to Stand with Resistance Around Legs - 3-4 x daily - 7 x weekly - 3 sets - 10 reps Seated Figure 4 Piriformis Stretch - 2 x daily - 7 x weekly - 1 sets - 3 reps - 30 hold Shoulder Scaption AAROM with Dowel - 2 x daily - 7 x weekly - 2 sets - 10 reps Doorway Pec Stretch at 60 Degrees Abduction with Arm Straight - 2 x daily - 7 x weekly - 1 sets - 3 reps - 30 hold Shoulder Flexion Wall Slide with Towel - 1 x daily - 7 x weekly - 2 sets - 10 reps

## 2021-05-03 DIAGNOSIS — D689 Coagulation defect, unspecified: Secondary | ICD-10-CM | POA: Diagnosis not present

## 2021-05-03 DIAGNOSIS — N2581 Secondary hyperparathyroidism of renal origin: Secondary | ICD-10-CM | POA: Diagnosis not present

## 2021-05-03 DIAGNOSIS — N186 End stage renal disease: Secondary | ICD-10-CM | POA: Diagnosis not present

## 2021-05-03 DIAGNOSIS — Z992 Dependence on renal dialysis: Secondary | ICD-10-CM | POA: Diagnosis not present

## 2021-05-03 DIAGNOSIS — E039 Hypothyroidism, unspecified: Secondary | ICD-10-CM | POA: Diagnosis not present

## 2021-05-03 DIAGNOSIS — R52 Pain, unspecified: Secondary | ICD-10-CM | POA: Diagnosis not present

## 2021-05-03 DIAGNOSIS — D631 Anemia in chronic kidney disease: Secondary | ICD-10-CM | POA: Diagnosis not present

## 2021-05-04 DIAGNOSIS — M0579 Rheumatoid arthritis with rheumatoid factor of multiple sites without organ or systems involvement: Secondary | ICD-10-CM | POA: Diagnosis not present

## 2021-05-04 DIAGNOSIS — N186 End stage renal disease: Secondary | ICD-10-CM | POA: Diagnosis not present

## 2021-05-04 DIAGNOSIS — E340 Carcinoid syndrome: Secondary | ICD-10-CM | POA: Diagnosis not present

## 2021-05-04 DIAGNOSIS — M2550 Pain in unspecified joint: Secondary | ICD-10-CM | POA: Diagnosis not present

## 2021-05-05 DIAGNOSIS — E039 Hypothyroidism, unspecified: Secondary | ICD-10-CM | POA: Diagnosis not present

## 2021-05-05 DIAGNOSIS — Z992 Dependence on renal dialysis: Secondary | ICD-10-CM | POA: Diagnosis not present

## 2021-05-05 DIAGNOSIS — D631 Anemia in chronic kidney disease: Secondary | ICD-10-CM | POA: Diagnosis not present

## 2021-05-05 DIAGNOSIS — R52 Pain, unspecified: Secondary | ICD-10-CM | POA: Diagnosis not present

## 2021-05-05 DIAGNOSIS — N2581 Secondary hyperparathyroidism of renal origin: Secondary | ICD-10-CM | POA: Diagnosis not present

## 2021-05-05 DIAGNOSIS — D689 Coagulation defect, unspecified: Secondary | ICD-10-CM | POA: Diagnosis not present

## 2021-05-05 DIAGNOSIS — N186 End stage renal disease: Secondary | ICD-10-CM | POA: Diagnosis not present

## 2021-05-08 DIAGNOSIS — D689 Coagulation defect, unspecified: Secondary | ICD-10-CM | POA: Diagnosis not present

## 2021-05-08 DIAGNOSIS — N186 End stage renal disease: Secondary | ICD-10-CM | POA: Diagnosis not present

## 2021-05-08 DIAGNOSIS — D631 Anemia in chronic kidney disease: Secondary | ICD-10-CM | POA: Diagnosis not present

## 2021-05-08 DIAGNOSIS — Z992 Dependence on renal dialysis: Secondary | ICD-10-CM | POA: Diagnosis not present

## 2021-05-08 DIAGNOSIS — N2581 Secondary hyperparathyroidism of renal origin: Secondary | ICD-10-CM | POA: Diagnosis not present

## 2021-05-10 DIAGNOSIS — N186 End stage renal disease: Secondary | ICD-10-CM | POA: Diagnosis not present

## 2021-05-10 DIAGNOSIS — D689 Coagulation defect, unspecified: Secondary | ICD-10-CM | POA: Diagnosis not present

## 2021-05-10 DIAGNOSIS — N2581 Secondary hyperparathyroidism of renal origin: Secondary | ICD-10-CM | POA: Diagnosis not present

## 2021-05-10 DIAGNOSIS — Z992 Dependence on renal dialysis: Secondary | ICD-10-CM | POA: Diagnosis not present

## 2021-05-10 DIAGNOSIS — D631 Anemia in chronic kidney disease: Secondary | ICD-10-CM | POA: Diagnosis not present

## 2021-05-12 DIAGNOSIS — Z992 Dependence on renal dialysis: Secondary | ICD-10-CM | POA: Diagnosis not present

## 2021-05-12 DIAGNOSIS — N186 End stage renal disease: Secondary | ICD-10-CM | POA: Diagnosis not present

## 2021-05-12 DIAGNOSIS — D631 Anemia in chronic kidney disease: Secondary | ICD-10-CM | POA: Diagnosis not present

## 2021-05-12 DIAGNOSIS — N2581 Secondary hyperparathyroidism of renal origin: Secondary | ICD-10-CM | POA: Diagnosis not present

## 2021-05-12 DIAGNOSIS — D689 Coagulation defect, unspecified: Secondary | ICD-10-CM | POA: Diagnosis not present

## 2021-05-15 DIAGNOSIS — N2581 Secondary hyperparathyroidism of renal origin: Secondary | ICD-10-CM | POA: Diagnosis not present

## 2021-05-15 DIAGNOSIS — D689 Coagulation defect, unspecified: Secondary | ICD-10-CM | POA: Diagnosis not present

## 2021-05-15 DIAGNOSIS — Z992 Dependence on renal dialysis: Secondary | ICD-10-CM | POA: Diagnosis not present

## 2021-05-15 DIAGNOSIS — D631 Anemia in chronic kidney disease: Secondary | ICD-10-CM | POA: Diagnosis not present

## 2021-05-15 DIAGNOSIS — N186 End stage renal disease: Secondary | ICD-10-CM | POA: Diagnosis not present

## 2021-05-16 ENCOUNTER — Ambulatory Visit (HOSPITAL_BASED_OUTPATIENT_CLINIC_OR_DEPARTMENT_OTHER): Payer: Medicare Other | Admitting: Physical Therapy

## 2021-05-16 ENCOUNTER — Encounter (HOSPITAL_BASED_OUTPATIENT_CLINIC_OR_DEPARTMENT_OTHER): Payer: Self-pay | Admitting: Physical Therapy

## 2021-05-16 ENCOUNTER — Other Ambulatory Visit: Payer: Self-pay

## 2021-05-16 DIAGNOSIS — M6281 Muscle weakness (generalized): Secondary | ICD-10-CM | POA: Diagnosis not present

## 2021-05-16 DIAGNOSIS — M25552 Pain in left hip: Secondary | ICD-10-CM | POA: Diagnosis not present

## 2021-05-16 DIAGNOSIS — M25512 Pain in left shoulder: Secondary | ICD-10-CM

## 2021-05-16 DIAGNOSIS — R262 Difficulty in walking, not elsewhere classified: Secondary | ICD-10-CM | POA: Diagnosis not present

## 2021-05-16 NOTE — Therapy (Signed)
[facility] [address] [place], [place], [address] Phone: [phone]   Fax:  [phone]  Physical Therapy Progress Note  Patient Details  Name: Kimberly [name] MRN: [id number] Date of Birth: [birth year] Referring Provider (PT): Inez Catalina, MD   Encounter Date: 05/16/2021   PT End of Session - 05/16/21 0936     Visit Number 4    Number of Visits 9    Date for PT Re-Evaluation 07/03/21    Authorization Type BCBS Medicare    PT Start Time 0930    PT Stop Time 0814    PT Time Calculation (min) 45 min    Activity Tolerance Patient tolerated treatment well    Behavior During Therapy [facility] for tasks assessed/performed             Past Medical History:  Diagnosis Date   Anemia    Asthma    Complication of anesthesia    Slow to awaken and AMS- took 1 month to memory and recall to return   Diabetes mellitus    medication induced with  steroids , 05/19/18 - last A1C was 4.something   Diverticulosis    Dyspnea    with exertion. when walking up stairs or walking a distance   First degree AV block    GERD (gastroesophageal reflux disease)    Heart murmur    child   History of blood transfusion    History of chronic cough    History of hiatal hernia    History of hoarseness    Hypertension    history; resolved on dialysis   Hypothyroidism    IgA nephropathy    ESRD - Hemo TTH Sat   Pneumonia    Premature atrial beat    Premature ventricular beat    Renal transplant failure and rejection 2012   Shingles 03/2011   Stroke [facility]) October 2010, March 2013   cerebral aneurysm, 07/2017    Past Surgical History:  Procedure Laterality Date   AV FISTULA PLACEMENT Left 05/20/2018   Procedure: INSERTION OF ARTERIOVENOUS (AV) GORE-TEX GRAFT LEFT  ARM;  Surgeon: [name], MD;  Location: [place];  Service: Vascular;  Laterality: Left;   CAPD INSERTION  08/25/2008   open, Dr [name]   CAPD INSERTION N/A  12/14/2013   Procedure: LAPAROSCOPIC INSERTION CONTINUOUS AMBULATORY PERITONEAL DIALYSIS  (CAPD) CATHETER;  Surgeon: [name], MD;  Location: [place];  Service: General;  Laterality: N/A;   CAPD REMOVAL  03/22/2010   Dr [name]   COLONOSCOPY     ESOPHAGOGASTRODUODENOSCOPY     EYE SURGERY Bilateral    radial keratotomy   Hemodialysis catheter Right 02/13/2018   INSERTION OF MESH N/A 12/14/2013   Procedure: INSERTION OF MESH;  Surgeon: [name], MD;  Location: [place];  Service: General;  Laterality: N/A;   KIDNEY TRANSPLANT Right 01/2010   [facility]   LAPAROSCOPIC LYSIS OF ADHESIONS N/A 12/14/2013   Procedure: LAPAROSCOPIC LYSIS OF ADHESIONS;  Surgeon: [name], MD;  Location: [place];  Service: General;  Laterality: N/A;   TOTAL HIP ARTHROPLASTY Left 02/06/2019   Procedure: TOTAL HIP ARTHROPLASTY ANTERIOR APPROACH;  Surgeon: Rod Can, MD;  Location: [place];  Service: Orthopedics;  Laterality: Left;   UMBILICAL HERNIA REPAIR  10/25/2009   open with mesh,  Dr [name]   URETER REVISION  04/2011   [facility]   VENTRAL HERNIA REPAIR N/A 12/14/2013   Procedure: LAPAROSCOPIC VENTRAL HERNIA;  Surgeon: Revonda Standard.  Gross, MD;  Location: [place];  Service: General;  Laterality: N/A;   VIDEO ASSISTED THORACOSCOPY (VATS)/WEDGE RESECTION Left 09/01/2014   Procedure: VIDEO ASSISTED THORACOSCOPY (VATS)/WEDGE RESECTION;  Surgeon: [name], MD;  Location: [place];  Service: Thoracic;  Laterality: Left;   VIDEO BRONCHOSCOPY Bilateral 07/05/2014   Procedure: VIDEO BRONCHOSCOPY WITH FLUORO;  Surgeon: [name], MD;  [facility];  Service: Cardiopulmonary;  Laterality: Bilateral;    There were no vitals filed for this visit.   Subjective Assessment - 05/16/21 0933     Subjective Pt states that the shoulder feels better. She feels that washing her hair is easier and without pain. Pt states the hip is better too.    Pertinent History CKD, CVA, L THA, anemia, asthma, HTN    How  long can you sit comfortably? N/A    How long can you stand comfortably? >1hr    How long can you walk comfortably? <1hr    Patient Stated Goals Pt would like to get rid of the pain in the shoulder, reduce swelling, and set up good home exercise program for gym as well.    Currently in Pain? No/denies    Pain Score 0-No pain                [facility] PT Assessment - 05/16/21 0001       Assessment   Medical Diagnosis M25.512 (ICD-10-CM) - Pain in left shoulder and L hip pain    Referring Provider (PT) Inez Catalina, MD    Hand Dominance Right    Prior Therapy [facility] for L THA      Precautions   Precautions None      Restrictions   Weight Bearing Restrictions No      Balance Screen   Has the patient fallen in the past 6 months No      [place] residence      Prior Function   Level of Independence Independent      Cognition   Overall Cognitive Status Within Functional Limits for tasks assessed      Observation/Other Assessments   Other Surveys  Quick Dash    Quick DASH  QuickDASH Score: 36.4 / 100 = 36.4 %      Sensation   Light Touch Appears Intact      AROM   Overall AROM Comments L shoulder flexion and ABD WFL, ER bheind back reach T3, IR T10; R WFL; L hip flexion 125, hip extension 10 deg, IR 32, ER 60      Strength   Overall Strength Comments L shoulder 4+/5 throughout, R shoulder 4+/5 throughout; L hip 4+/5 throughout      Palpation   Palpation comment TTP L UT and infra      Transfers   Five time sit to stand comments  7.8s                                    [facility] Adult PT Treatment/Exercise - 05/16/21 0001       Posture/Postural Control   Posture/Postural Control Postural limitations    Postural Limitations Rounded Shoulders;Increased thoracic kyphosis      Exercises   Exercises Shoulder;Knee/Hip      Knee/Hip Exercises: Stretches   Piriformis Stretch Limitations seated figure 4 30s 3x       Knee/Hip Exercises: Standing   Other Standing Knee  Exercises sidestepping YTB at knees (on hold)     Knee/Hip Exercises: Seated   Sit to Sand 20 reps;Other (comment)   5lbs goblet squat 3x10     Shoulder Exercises: Supine   Other Supine Exercises 5lb table/floor press 2x10                   Shoulder Exercises: Standing   Flexion Limitations AROM 1lb2x10    Other Standing Exercises standing ABD thumbs up 2x10 1lb; single arm DB row 5lb 2x10      Shoulder Exercises: ROM/Strengthening   Wall Wash 20x               Manual Therapy   Manual Therapy Joint mobilization;Soft tissue mobilization    Joint Mobilization grade III inf and post GHJ mob    Soft tissue mobilization L infra                      PT Short Term Goals - 05/16/21 1039       PT SHORT TERM GOAL #1   Title Pt will become independent with HEP in order to demonstrate synthesis of PT education.    Time 4    Period Weeks    Status Achieved      PT SHORT TERM GOAL #2   Title Pt will have an at least 16 pt improvement in Quick DASH measure in order to demonstrate MCID improvement in daily function.    Time 4    Period Weeks    Status Partially Met      PT SHORT TERM GOAL #3   Title Pt will be able to demonstrate full OH reach and behind back reach in order to demonstrate functional improvement in UE function for self-care and house hold duties.    Time 4    Period Weeks    Status Partially Met               PT Long Term Goals - 05/16/21 1039       PT LONG TERM GOAL #1   Title Pt  will become independent with final HEP in order to demonstrate synthesis of PT education.    Time 8    Period Weeks    Status On-going      PT LONG TERM GOAL #2   Title Pt will be able to perform 5XSTS in under 12s  in order to demonstrate functional improvement above the cut off score for adults.    Status Achieved      PT LONG TERM GOAL #3   Title Pt will be able to reach [facility] and grab/reach/hold >5 lbs  in order to demonstrate functional improvement in L/R UE function.    Time 8    Period Weeks    Status Partially Met                   Plan - 05/16/21 1009     Clinical Impression Statement Pt with clincially signficant improvement in pain rating, self reported UE function, shoulder and hip ROM, and strength. Pt able to progress to general shoulder and hip/LE strengthening without pain or discomfort. Pt performs functional transfers and UE reaching activities without pain or difficulty as compared to initial eval. Pt to decrease frequency of visits and transition more to HEP and personal training sessions within [place] facility. Pt would benefit from continued skilled therapy in order to reach goals and maximize functional L UE and LE strength  and ROM for full return to PLOF.    Personal Factors and Comorbidities Age;Comorbidity 1;Comorbidity 2;Comorbidity 3+;Time since onset of injury/illness/exacerbation    Examination-Activity Limitations Reach Overhead;Lift;Stand;Stairs;Squat    Examination-Participation Restrictions Occupation;Community Activity;Interpersonal Relationship;Other    Stability/Clinical Decision Making Stable/Uncomplicated    Rehab Potential Good    PT Frequency Biweekly    PT Duration 8 weeks    PT Treatment/Interventions ADLs/Self Care Home Management;Aquatic Therapy;Cryotherapy;Electrical Stimulation;Iontophoresis 24m/ml Dexamethasone;Moist Heat;Ultrasound;Gait training;Stair training;Functional mobility training;Therapeutic activities;Therapeutic exercise;Balance training;Neuromuscular re-education;Orthotic Fit/Training;Patient/family education;Passive range of motion;Joint Manipulations;Spinal Manipulations;Vasopneumatic Device;Taping;Dry needling;Manual techniques    PT Next Visit Plan CKC UE exercise, machine tutorial, LE strength progression    PT Home Exercise Plan Access Code: [email or web address]   Consulted and Agree with Plan of Care Patient              Patient will benefit from skilled therapeutic intervention in order to improve the following deficits and impairments:  Abnormal gait, Improper body mechanics, Pain, Hypomobility, Decreased strength, Decreased range of motion, Impaired flexibility, Increased muscle spasms, Impaired UE functional use, Decreased endurance  Visit Diagnosis: Left shoulder pain, unspecified chronicity  Pain in left hip  Muscle weakness (generalized)  Difficulty walking     Problem List Patient Active Problem List   Diagnosis Date Noted   Displaced fracture of left femoral neck (HCC) 02/06/2019   Closed left hip fracture, initial encounter ([facility] 02/05/2019   History of stroke 02/05/2019   IgA nephropathy with nephrotic syndrome 10/30/2017   Snoring 10/30/2017   Excessive daytime sleepiness 10/30/2017   Complaint related to dreams 058/05/9832  Acute embolic stroke ([place] [id number]  Pre-transplant evaluation for kidney transplant 05/08/2017   Laryngopharyngeal reflux (LPR) 05/29/2016   Cough, persistent 05/29/2016   Chronic cough 09/15/2015   Paroxysmal atrial fibrillation (HCC) 08/21/2015   Acute pericarditis 08/05/2015   Elevated troponin 08/05/2015   Pain in the chest    Atypical chest pain 08/04/2015   LUL cavitary lesion post VATS 2015-cx neg 09/01/2014   Pulmonary infiltrates 07/05/2014   Gross hematuria 06/30/2014   Kidney transplant failure 06/28/2014   Renal failure 06/28/2014   Peritoneal dialysis catheter in place March 2015 01/14/2014   Ventral hernia s/p lap repair w mesh March 2015 01/14/2014   Secondary hyperparathyroidism (of renal origin) 04/20/2013   ESRD (end stage renal disease) ([facility] 10/28/2012   Diarrhea, unspecified 08/10/2012   Urinary tract infection 08/10/2012   Reactive airway disease 03/13/2012   Pulmonary nodule 03/13/2012   Stroke/cerebrovascular accident ([facility] 03/13/2012   Vitamin D deficiency, unspecified 03/13/2012   PVC's (premature ventricular  contractions) 01/17/2012   Stroke ([facility] 11/29/2011   DM type 2 causing complication ([facility] 067/34/1937  S/P kidney transplant    IgA nephropathy    GERD (gastroesophageal reflux disease) 12/07/2007   Cough variant asthma vs uacs vs components of both  11/09/2007   Hypothyroidism, unspecified 10/12/2007   ANEMIA 10/12/2007   HTN (hypertension) 10/12/2007   CARDIAC ARRHYTHMIA 10/12/2007   Asthma 10/12/2007   CKD (chronic kidney disease) stage V requiring chronic dialysis ([facility] 10/12/2007  ADaleen BoPT, DPT 05/16/21 10:40 AM   [place] [address]Phone: [phone]  Fax:  [phone] Name: ACamrie [name] MRN: [id number] of Birth: [birth year]

## 2021-05-19 DIAGNOSIS — Z992 Dependence on renal dialysis: Secondary | ICD-10-CM | POA: Diagnosis not present

## 2021-05-19 DIAGNOSIS — D631 Anemia in chronic kidney disease: Secondary | ICD-10-CM | POA: Diagnosis not present

## 2021-05-19 DIAGNOSIS — N186 End stage renal disease: Secondary | ICD-10-CM | POA: Diagnosis not present

## 2021-05-19 DIAGNOSIS — N2581 Secondary hyperparathyroidism of renal origin: Secondary | ICD-10-CM | POA: Diagnosis not present

## 2021-05-19 DIAGNOSIS — D689 Coagulation defect, unspecified: Secondary | ICD-10-CM | POA: Diagnosis not present

## 2021-05-22 DIAGNOSIS — D689 Coagulation defect, unspecified: Secondary | ICD-10-CM | POA: Diagnosis not present

## 2021-05-22 DIAGNOSIS — Z992 Dependence on renal dialysis: Secondary | ICD-10-CM | POA: Diagnosis not present

## 2021-05-22 DIAGNOSIS — N2581 Secondary hyperparathyroidism of renal origin: Secondary | ICD-10-CM | POA: Diagnosis not present

## 2021-05-22 DIAGNOSIS — N186 End stage renal disease: Secondary | ICD-10-CM | POA: Diagnosis not present

## 2021-05-22 DIAGNOSIS — D631 Anemia in chronic kidney disease: Secondary | ICD-10-CM | POA: Diagnosis not present

## 2021-05-23 DIAGNOSIS — T8612 Kidney transplant failure: Secondary | ICD-10-CM | POA: Diagnosis not present

## 2021-05-23 DIAGNOSIS — N186 End stage renal disease: Secondary | ICD-10-CM | POA: Diagnosis not present

## 2021-05-23 DIAGNOSIS — Z992 Dependence on renal dialysis: Secondary | ICD-10-CM | POA: Diagnosis not present

## 2021-05-24 DIAGNOSIS — D631 Anemia in chronic kidney disease: Secondary | ICD-10-CM | POA: Diagnosis not present

## 2021-05-24 DIAGNOSIS — Z992 Dependence on renal dialysis: Secondary | ICD-10-CM | POA: Diagnosis not present

## 2021-05-24 DIAGNOSIS — N186 End stage renal disease: Secondary | ICD-10-CM | POA: Diagnosis not present

## 2021-05-24 DIAGNOSIS — D689 Coagulation defect, unspecified: Secondary | ICD-10-CM | POA: Diagnosis not present

## 2021-05-24 DIAGNOSIS — N2581 Secondary hyperparathyroidism of renal origin: Secondary | ICD-10-CM | POA: Diagnosis not present

## 2021-05-26 DIAGNOSIS — N186 End stage renal disease: Secondary | ICD-10-CM | POA: Diagnosis not present

## 2021-05-26 DIAGNOSIS — Z992 Dependence on renal dialysis: Secondary | ICD-10-CM | POA: Diagnosis not present

## 2021-05-26 DIAGNOSIS — D689 Coagulation defect, unspecified: Secondary | ICD-10-CM | POA: Diagnosis not present

## 2021-05-26 DIAGNOSIS — N2581 Secondary hyperparathyroidism of renal origin: Secondary | ICD-10-CM | POA: Diagnosis not present

## 2021-05-26 DIAGNOSIS — D631 Anemia in chronic kidney disease: Secondary | ICD-10-CM | POA: Diagnosis not present

## 2021-05-29 DIAGNOSIS — D689 Coagulation defect, unspecified: Secondary | ICD-10-CM | POA: Diagnosis not present

## 2021-05-29 DIAGNOSIS — Z992 Dependence on renal dialysis: Secondary | ICD-10-CM | POA: Diagnosis not present

## 2021-05-29 DIAGNOSIS — N186 End stage renal disease: Secondary | ICD-10-CM | POA: Diagnosis not present

## 2021-05-29 DIAGNOSIS — D631 Anemia in chronic kidney disease: Secondary | ICD-10-CM | POA: Diagnosis not present

## 2021-05-29 DIAGNOSIS — N2581 Secondary hyperparathyroidism of renal origin: Secondary | ICD-10-CM | POA: Diagnosis not present

## 2021-05-30 ENCOUNTER — Encounter (HOSPITAL_BASED_OUTPATIENT_CLINIC_OR_DEPARTMENT_OTHER): Payer: Medicare Other | Admitting: Physical Therapy

## 2021-05-30 DIAGNOSIS — G4733 Obstructive sleep apnea (adult) (pediatric): Secondary | ICD-10-CM | POA: Diagnosis not present

## 2021-05-31 DIAGNOSIS — N2581 Secondary hyperparathyroidism of renal origin: Secondary | ICD-10-CM | POA: Diagnosis not present

## 2021-05-31 DIAGNOSIS — D689 Coagulation defect, unspecified: Secondary | ICD-10-CM | POA: Diagnosis not present

## 2021-05-31 DIAGNOSIS — D631 Anemia in chronic kidney disease: Secondary | ICD-10-CM | POA: Diagnosis not present

## 2021-05-31 DIAGNOSIS — Z992 Dependence on renal dialysis: Secondary | ICD-10-CM | POA: Diagnosis not present

## 2021-05-31 DIAGNOSIS — N186 End stage renal disease: Secondary | ICD-10-CM | POA: Diagnosis not present

## 2021-06-01 ENCOUNTER — Other Ambulatory Visit: Payer: Self-pay

## 2021-06-01 ENCOUNTER — Encounter (HOSPITAL_BASED_OUTPATIENT_CLINIC_OR_DEPARTMENT_OTHER): Payer: Self-pay | Admitting: Physical Therapy

## 2021-06-01 ENCOUNTER — Ambulatory Visit (HOSPITAL_BASED_OUTPATIENT_CLINIC_OR_DEPARTMENT_OTHER): Payer: Medicare Other | Attending: Sports Medicine | Admitting: Physical Therapy

## 2021-06-01 DIAGNOSIS — M25552 Pain in left hip: Secondary | ICD-10-CM | POA: Insufficient documentation

## 2021-06-01 DIAGNOSIS — M25512 Pain in left shoulder: Secondary | ICD-10-CM | POA: Diagnosis not present

## 2021-06-01 DIAGNOSIS — M6281 Muscle weakness (generalized): Secondary | ICD-10-CM | POA: Insufficient documentation

## 2021-06-01 DIAGNOSIS — R262 Difficulty in walking, not elsewhere classified: Secondary | ICD-10-CM | POA: Diagnosis not present

## 2021-06-01 NOTE — Therapy (Addendum)
[facility] [address] [place], [place], [address] Phone: [phone]   Fax:  [phone]  Physical Therapy Treatment  Patient Details  Name: Kimberly [name] MRN: [id number] Date of Birth: 05-04-1956 Referring Provider (PT): Christena Deem, MD   Encounter Date: 06/01/2021   PT End of Session - 06/01/21 1128     Visit Number 5    Number of Visits 9    Date for PT Re-Evaluation 07/03/21    Authorization Type BCBS Medicare    PT Start Time 1100    PT Stop Time 1145    PT Time Calculation (min) 45 min    Activity Tolerance Patient tolerated treatment well    Behavior During Therapy [facility] for tasks assessed/performed             Past Medical History:  Diagnosis Date   Anemia    Asthma    Complication of anesthesia    Slow to awaken and AMS- took 1 month to memory and recall to return   Diabetes mellitus    medication induced with  steroids , 05/19/18 - last A1C was 4.something   Diverticulosis    Dyspnea    with exertion. when walking up stairs or walking a distance   First degree AV block    GERD (gastroesophageal reflux disease)    Heart murmur    child   History of blood transfusion    History of chronic cough    History of hiatal hernia    History of hoarseness    Hypertension    history; resolved on dialysis   Hypothyroidism    IgA nephropathy    ESRD - Hemo TTH Sat   Pneumonia    Premature atrial beat    Premature ventricular beat    Renal transplant failure and rejection 2012   Shingles 03/2011   Stroke [facility]) October 2010, March 2013   cerebral aneurysm, 07/2017    Past Surgical History:  Procedure Laterality Date   AV FISTULA PLACEMENT Left 05/20/2018   Procedure: INSERTION OF ARTERIOVENOUS (AV) GORE-TEX GRAFT LEFT  ARM;  Surgeon: Maeola Harman, MD;  Location: [facility] OR;  Service: Vascular;  Laterality: Left;   CAPD INSERTION  08/25/2008   open, Dr Zachery Dakins   CAPD INSERTION N/A  12/14/2013   Procedure: LAPAROSCOPIC INSERTION CONTINUOUS AMBULATORY PERITONEAL DIALYSIS  (CAPD) CATHETER;  Surgeon: Ardeth Sportsman, MD;  [facility];  Service: General;  Laterality: N/A;   CAPD REMOVAL  03/22/2010   Dr Zachery Dakins   COLONOSCOPY     ESOPHAGOGASTRODUODENOSCOPY     EYE SURGERY Bilateral    radial keratotomy   Hemodialysis catheter Right 02/13/2018   INSERTION OF MESH N/A 12/14/2013   Procedure: INSERTION OF MESH;  Surgeon: Ardeth Sportsman, MD;  [facility];  Service: General;  Laterality: N/A;   KIDNEY TRANSPLANT Right 01/2010   [facility]   LAPAROSCOPIC LYSIS OF ADHESIONS N/A 12/14/2013   Procedure: LAPAROSCOPIC LYSIS OF ADHESIONS;  Surgeon: Ardeth Sportsman, MD;  [facility];  Service: General;  Laterality: N/A;   TOTAL HIP ARTHROPLASTY Left 02/06/2019   Procedure: TOTAL HIP ARTHROPLASTY ANTERIOR APPROACH;  Surgeon: Samson Frederic, MD;  [facility];  Service: Orthopedics;  Laterality: Left;   UMBILICAL HERNIA REPAIR  10/25/2009   open with mesh,  Dr Zachery Dakins   URETER REVISION  04/2011   [facility]   VENTRAL HERNIA REPAIR N/A 12/14/2013   Procedure: LAPAROSCOPIC VENTRAL HERNIA;  Surgeon: Ardeth Sportsman,  MD;  [facility];  Service: General;  Laterality: N/A;   VIDEO ASSISTED THORACOSCOPY (VATS)/WEDGE RESECTION Left 09/01/2014   Procedure: VIDEO ASSISTED THORACOSCOPY (VATS)/WEDGE RESECTION;  Surgeon: Loreli Slot, MD;  Location: [facility] OR;  Service: Thoracic;  Laterality: Left;   VIDEO BRONCHOSCOPY Bilateral 07/05/2014   Procedure: VIDEO BRONCHOSCOPY WITH FLUORO;  Surgeon: Nyoka Cowden, MD;  [facility];  Service: Cardiopulmonary;  Laterality: Bilateral;    There were no vitals filed for this visit.   Subjective Assessment - 06/01/21 1106     Subjective Pt states the L shoulder might be about the same. She feels the shot might be wearing off. Pt states washing hair is still better. She states she is still improving. She states she has had some more L  elbow pain recently.    Pertinent History CKD, CVA, L THA, anemia, asthma, HTN    How long can you sit comfortably? N/A    How long can you stand comfortably? >1hr    How long can you walk comfortably? <1hr    Patient Stated Goals Pt would like to get rid of the pain in the shoulder, reduce swelling, and set up good home exercise program for gym as well.    Currently in Pain? No/denies    Pain Score 0-No pain                  [facility] Adult PT Treatment/Exercise - 06/01/21 0001                                                                 Shoulder Exercises: Supine         Shoulder Exercises: Seated   Other Seated Exercises seated chest press machine seat 3, bar position 2, 10lbs 2x10; bicep curl 3lbs with supination 2x10,  standing cable low row 15lbs 2x10, DB OHP neutral 3lb 2x10                   Shoulder Exercises: ROM/Strengthening   UBE (Upper Arm Bike) L1 2 min fwd 2 min back      Shoulder Exercises: Stretch   Other Shoulder Stretches triceps stretch 30s 3x      Manual Therapy   Manual Therapy Joint mobilization;Soft tissue mobilization    Joint Mobilization grade III inf and post GHJ mob    Soft tissue mobilization L triceps                       PT Short Term Goals - 05/16/21 1039       PT SHORT TERM GOAL #1   Title Pt will become independent with HEP in order to demonstrate synthesis of PT education.    Time 4    Period Weeks    Status Achieved      PT SHORT TERM GOAL #2   Title Pt will have an at least 16 pt improvement in Quick DASH measure in order to demonstrate MCID improvement in daily function.    Time 4    Period Weeks    Status Partially Met      PT SHORT TERM GOAL #3   Title Pt will be able to demonstrate full OH reach and behind back reach in order to  demonstrate functional improvement in UE function for self-care and house hold duties.    Time 4    Period Weeks    Status Partially Met                PT Long Term Goals - 05/16/21 1039       PT LONG TERM GOAL #1   Title Pt  will become independent with final HEP in order to demonstrate synthesis of PT education.    Time 8    Period Weeks    Status On-going      PT LONG TERM GOAL #2   Title Pt will be able to perform 5XSTS in under 12s  in order to demonstrate functional improvement above the cut off score for adults.    Status Achieved      PT LONG TERM GOAL #3   Title Pt will be able to reach [facility] and grab/reach/hold >5 lbs in order to demonstrate functional improvement in L/R UE function.    Time 8    Period Weeks    Status Partially Met                   Plan - 06/01/21 1129     Clinical Impression Statement Pt presents with L elbow pain today that is possibly related triceps hypertonicity from exercise. Triggerpoints within triceps referred pain into elbow and L shoudler upon palpation. Pt had relief of discomfort and improved ROM following STM and exercise. Pt was able to go through review of safe gym machine exercise without pain and demonstrated good understanding of safe set up and mechanics. Pt likely able to D/C within 2-3 visits if pain continues to subside and there is no further irritation to L shoulder. Pt states her hip has felt good and there are no complaints. Pt would benefit from continued skilled therapy in order to reach goals and maximize functional L UE and LE strength and ROM for full return to PLOF.    Personal Factors and Comorbidities Age;Comorbidity 1;Comorbidity 2;Comorbidity 3+;Time since onset of injury/illness/exacerbation    Examination-Activity Limitations Reach Overhead;Lift;Stand;Stairs;Squat    Examination-Participation Restrictions Occupation;Community Activity;Interpersonal Relationship;Other    Stability/Clinical Decision Making Stable/Uncomplicated    Rehab Potential Good    PT Frequency Biweekly    PT Duration 8 weeks    PT Treatment/Interventions ADLs/Self Care Home  Management;Aquatic Therapy;Cryotherapy;Electrical Stimulation;Iontophoresis 4mg /ml Dexamethasone;Moist Heat;Ultrasound;Gait training;Stair training;Functional mobility training;Therapeutic activities;Therapeutic exercise;Balance training;Neuromuscular re-education;Orthotic Fit/Training;Patient/family education;Passive range of motion;Joint Manipulations;Spinal Manipulations;Vasopneumatic Device;Taping;Dry needling;Manual techniques    PT Next Visit Plan CKC UE exercise, LE strength progression    PT Home Exercise Plan Access Code: [email or web address]    Consulted and Agree with Plan of Care Patient             Patient will benefit from skilled therapeutic intervention in order to improve the following deficits and impairments:  Abnormal gait, Improper body mechanics, Pain, Hypomobility, Decreased strength, Decreased range of motion, Impaired flexibility, Increased muscle spasms, Impaired UE functional use, Decreased endurance  Visit Diagnosis: Left shoulder pain, unspecified chronicity  Pain in left hip  Muscle weakness (generalized)  Difficulty walking     Problem List Patient Active Problem List   Diagnosis Date Noted   Displaced fracture of left femoral neck (HCC) 02/06/2019   Closed left hip fracture, initial encounter (HCC) 02/05/2019   History of stroke 02/05/2019   IgA nephropathy with nephrotic syndrome 10/30/2017   Snoring 10/30/2017   Excessive daytime sleepiness 10/30/2017   Complaint related to  dreams 10/30/2017   Acute embolic stroke (HCC) 09/18/2017   Pre-transplant evaluation for kidney transplant 05/08/2017   Laryngopharyngeal reflux (LPR) 05/29/2016   Cough, persistent 05/29/2016   Chronic cough 09/15/2015   Paroxysmal atrial fibrillation (HCC) 08/21/2015   Acute pericarditis 08/05/2015   Elevated troponin 08/05/2015   Pain in the chest    Atypical chest pain 08/04/2015   LUL cavitary lesion post VATS 2015-cx neg 09/01/2014   Pulmonary infiltrates 07/05/2014    Gross hematuria 06/30/2014   Kidney transplant failure 06/28/2014   Renal failure 06/28/2014   Peritoneal dialysis catheter in place March 2015 01/14/2014   Ventral hernia s/p lap repair w mesh March 2015 01/14/2014   Secondary hyperparathyroidism (of renal origin) 04/20/2013   ESRD (end stage renal disease) (HCC) 10/28/2012   Diarrhea, unspecified 08/10/2012   Urinary tract infection 08/10/2012   Reactive airway disease 03/13/2012   Pulmonary nodule 03/13/2012   Stroke/cerebrovascular accident (HCC) 03/13/2012   Vitamin D deficiency, unspecified 03/13/2012   PVC's (premature ventricular contractions) 01/17/2012   Stroke (HCC) 11/29/2011   DM type 2 causing complication (HCC) 11/29/2011   S/P kidney transplant    IgA nephropathy    GERD (gastroesophageal reflux disease) 12/07/2007   Cough variant asthma vs uacs vs components of both  11/09/2007   Hypothyroidism, unspecified 10/12/2007   ANEMIA 10/12/2007   HTN (hypertension) 10/12/2007   CARDIAC ARRHYTHMIA 10/12/2007   Asthma 10/12/2007   CKD (chronic kidney disease) stage V requiring chronic dialysis (HCC) 10/12/2007    Zebedee Iba, PT 06/01/2021, 12:02 PM  [facility] [address] [place], [place], [address] Phone: [phone]   Fax:  [phone]  Name: Kimberly [name] MRN: [id number] Date of Birth: 01/04/1956  PHYSICAL THERAPY DISCHARGE SUMMARY  Visits from Start of Care: 5  Plan: Patient agrees to discharge.  Patient goals were not met. Patient is being discharged due to not returning to therapy.

## 2021-06-02 DIAGNOSIS — N2581 Secondary hyperparathyroidism of renal origin: Secondary | ICD-10-CM | POA: Diagnosis not present

## 2021-06-02 DIAGNOSIS — N186 End stage renal disease: Secondary | ICD-10-CM | POA: Diagnosis not present

## 2021-06-02 DIAGNOSIS — D689 Coagulation defect, unspecified: Secondary | ICD-10-CM | POA: Diagnosis not present

## 2021-06-02 DIAGNOSIS — D631 Anemia in chronic kidney disease: Secondary | ICD-10-CM | POA: Diagnosis not present

## 2021-06-02 DIAGNOSIS — Z992 Dependence on renal dialysis: Secondary | ICD-10-CM | POA: Diagnosis not present

## 2021-06-05 DIAGNOSIS — D689 Coagulation defect, unspecified: Secondary | ICD-10-CM | POA: Diagnosis not present

## 2021-06-05 DIAGNOSIS — Z992 Dependence on renal dialysis: Secondary | ICD-10-CM | POA: Diagnosis not present

## 2021-06-05 DIAGNOSIS — N186 End stage renal disease: Secondary | ICD-10-CM | POA: Diagnosis not present

## 2021-06-05 DIAGNOSIS — E039 Hypothyroidism, unspecified: Secondary | ICD-10-CM | POA: Diagnosis not present

## 2021-06-05 DIAGNOSIS — D631 Anemia in chronic kidney disease: Secondary | ICD-10-CM | POA: Diagnosis not present

## 2021-06-05 DIAGNOSIS — N2581 Secondary hyperparathyroidism of renal origin: Secondary | ICD-10-CM | POA: Diagnosis not present

## 2021-06-06 DIAGNOSIS — M25522 Pain in left elbow: Secondary | ICD-10-CM | POA: Diagnosis not present

## 2021-06-07 DIAGNOSIS — E039 Hypothyroidism, unspecified: Secondary | ICD-10-CM | POA: Diagnosis not present

## 2021-06-07 DIAGNOSIS — Z992 Dependence on renal dialysis: Secondary | ICD-10-CM | POA: Diagnosis not present

## 2021-06-07 DIAGNOSIS — D631 Anemia in chronic kidney disease: Secondary | ICD-10-CM | POA: Diagnosis not present

## 2021-06-07 DIAGNOSIS — D689 Coagulation defect, unspecified: Secondary | ICD-10-CM | POA: Diagnosis not present

## 2021-06-07 DIAGNOSIS — N2581 Secondary hyperparathyroidism of renal origin: Secondary | ICD-10-CM | POA: Diagnosis not present

## 2021-06-07 DIAGNOSIS — N186 End stage renal disease: Secondary | ICD-10-CM | POA: Diagnosis not present

## 2021-06-09 DIAGNOSIS — Z992 Dependence on renal dialysis: Secondary | ICD-10-CM | POA: Diagnosis not present

## 2021-06-09 DIAGNOSIS — N186 End stage renal disease: Secondary | ICD-10-CM | POA: Diagnosis not present

## 2021-06-09 DIAGNOSIS — E039 Hypothyroidism, unspecified: Secondary | ICD-10-CM | POA: Diagnosis not present

## 2021-06-09 DIAGNOSIS — D689 Coagulation defect, unspecified: Secondary | ICD-10-CM | POA: Diagnosis not present

## 2021-06-09 DIAGNOSIS — N2581 Secondary hyperparathyroidism of renal origin: Secondary | ICD-10-CM | POA: Diagnosis not present

## 2021-06-09 DIAGNOSIS — D631 Anemia in chronic kidney disease: Secondary | ICD-10-CM | POA: Diagnosis not present

## 2021-06-12 DIAGNOSIS — D631 Anemia in chronic kidney disease: Secondary | ICD-10-CM | POA: Diagnosis not present

## 2021-06-12 DIAGNOSIS — N2581 Secondary hyperparathyroidism of renal origin: Secondary | ICD-10-CM | POA: Diagnosis not present

## 2021-06-12 DIAGNOSIS — N186 End stage renal disease: Secondary | ICD-10-CM | POA: Diagnosis not present

## 2021-06-12 DIAGNOSIS — Z992 Dependence on renal dialysis: Secondary | ICD-10-CM | POA: Diagnosis not present

## 2021-06-12 DIAGNOSIS — D689 Coagulation defect, unspecified: Secondary | ICD-10-CM | POA: Diagnosis not present

## 2021-06-13 ENCOUNTER — Encounter (HOSPITAL_BASED_OUTPATIENT_CLINIC_OR_DEPARTMENT_OTHER): Payer: Medicare Other | Admitting: Physical Therapy

## 2021-06-14 DIAGNOSIS — D689 Coagulation defect, unspecified: Secondary | ICD-10-CM | POA: Diagnosis not present

## 2021-06-14 DIAGNOSIS — Z992 Dependence on renal dialysis: Secondary | ICD-10-CM | POA: Diagnosis not present

## 2021-06-14 DIAGNOSIS — N2581 Secondary hyperparathyroidism of renal origin: Secondary | ICD-10-CM | POA: Diagnosis not present

## 2021-06-14 DIAGNOSIS — D631 Anemia in chronic kidney disease: Secondary | ICD-10-CM | POA: Diagnosis not present

## 2021-06-14 DIAGNOSIS — N186 End stage renal disease: Secondary | ICD-10-CM | POA: Diagnosis not present

## 2021-06-14 DIAGNOSIS — G4733 Obstructive sleep apnea (adult) (pediatric): Secondary | ICD-10-CM | POA: Diagnosis not present

## 2021-06-15 ENCOUNTER — Ambulatory Visit (HOSPITAL_BASED_OUTPATIENT_CLINIC_OR_DEPARTMENT_OTHER): Payer: Medicare Other | Admitting: Physical Therapy

## 2021-06-16 DIAGNOSIS — Z992 Dependence on renal dialysis: Secondary | ICD-10-CM | POA: Diagnosis not present

## 2021-06-16 DIAGNOSIS — N186 End stage renal disease: Secondary | ICD-10-CM | POA: Diagnosis not present

## 2021-06-16 DIAGNOSIS — D689 Coagulation defect, unspecified: Secondary | ICD-10-CM | POA: Diagnosis not present

## 2021-06-16 DIAGNOSIS — D631 Anemia in chronic kidney disease: Secondary | ICD-10-CM | POA: Diagnosis not present

## 2021-06-16 DIAGNOSIS — N2581 Secondary hyperparathyroidism of renal origin: Secondary | ICD-10-CM | POA: Diagnosis not present

## 2021-06-19 DIAGNOSIS — N186 End stage renal disease: Secondary | ICD-10-CM | POA: Diagnosis not present

## 2021-06-19 DIAGNOSIS — Z992 Dependence on renal dialysis: Secondary | ICD-10-CM | POA: Diagnosis not present

## 2021-06-19 DIAGNOSIS — U071 COVID-19: Secondary | ICD-10-CM | POA: Diagnosis not present

## 2021-06-19 DIAGNOSIS — D631 Anemia in chronic kidney disease: Secondary | ICD-10-CM | POA: Diagnosis not present

## 2021-06-19 DIAGNOSIS — D689 Coagulation defect, unspecified: Secondary | ICD-10-CM | POA: Diagnosis not present

## 2021-06-19 DIAGNOSIS — N2581 Secondary hyperparathyroidism of renal origin: Secondary | ICD-10-CM | POA: Diagnosis not present

## 2021-06-21 DIAGNOSIS — N2581 Secondary hyperparathyroidism of renal origin: Secondary | ICD-10-CM | POA: Diagnosis not present

## 2021-06-21 DIAGNOSIS — U071 COVID-19: Secondary | ICD-10-CM | POA: Diagnosis not present

## 2021-06-21 DIAGNOSIS — Z992 Dependence on renal dialysis: Secondary | ICD-10-CM | POA: Diagnosis not present

## 2021-06-21 DIAGNOSIS — D689 Coagulation defect, unspecified: Secondary | ICD-10-CM | POA: Diagnosis not present

## 2021-06-21 DIAGNOSIS — D631 Anemia in chronic kidney disease: Secondary | ICD-10-CM | POA: Diagnosis not present

## 2021-06-21 DIAGNOSIS — N186 End stage renal disease: Secondary | ICD-10-CM | POA: Diagnosis not present

## 2021-06-22 DIAGNOSIS — N186 End stage renal disease: Secondary | ICD-10-CM | POA: Diagnosis not present

## 2021-06-22 DIAGNOSIS — Z992 Dependence on renal dialysis: Secondary | ICD-10-CM | POA: Diagnosis not present

## 2021-06-22 DIAGNOSIS — T8612 Kidney transplant failure: Secondary | ICD-10-CM | POA: Diagnosis not present

## 2021-06-23 DIAGNOSIS — N186 End stage renal disease: Secondary | ICD-10-CM | POA: Diagnosis not present

## 2021-06-23 DIAGNOSIS — D631 Anemia in chronic kidney disease: Secondary | ICD-10-CM | POA: Diagnosis not present

## 2021-06-23 DIAGNOSIS — D689 Coagulation defect, unspecified: Secondary | ICD-10-CM | POA: Diagnosis not present

## 2021-06-23 DIAGNOSIS — Z992 Dependence on renal dialysis: Secondary | ICD-10-CM | POA: Diagnosis not present

## 2021-06-23 DIAGNOSIS — N2581 Secondary hyperparathyroidism of renal origin: Secondary | ICD-10-CM | POA: Diagnosis not present

## 2021-06-26 DIAGNOSIS — U071 COVID-19: Secondary | ICD-10-CM | POA: Diagnosis not present

## 2021-06-26 DIAGNOSIS — Z992 Dependence on renal dialysis: Secondary | ICD-10-CM | POA: Diagnosis not present

## 2021-06-26 DIAGNOSIS — D689 Coagulation defect, unspecified: Secondary | ICD-10-CM | POA: Diagnosis not present

## 2021-06-26 DIAGNOSIS — D631 Anemia in chronic kidney disease: Secondary | ICD-10-CM | POA: Diagnosis not present

## 2021-06-26 DIAGNOSIS — N186 End stage renal disease: Secondary | ICD-10-CM | POA: Diagnosis not present

## 2021-06-26 DIAGNOSIS — N2581 Secondary hyperparathyroidism of renal origin: Secondary | ICD-10-CM | POA: Diagnosis not present

## 2021-06-27 ENCOUNTER — Encounter (HOSPITAL_BASED_OUTPATIENT_CLINIC_OR_DEPARTMENT_OTHER): Payer: Medicare Other | Admitting: Physical Therapy

## 2021-06-28 ENCOUNTER — Other Ambulatory Visit: Payer: Self-pay

## 2021-06-28 ENCOUNTER — Encounter (HOSPITAL_COMMUNITY): Payer: Self-pay | Admitting: Vascular Surgery

## 2021-06-28 DIAGNOSIS — U071 COVID-19: Secondary | ICD-10-CM | POA: Diagnosis not present

## 2021-06-28 DIAGNOSIS — N186 End stage renal disease: Secondary | ICD-10-CM | POA: Diagnosis not present

## 2021-06-28 DIAGNOSIS — Z992 Dependence on renal dialysis: Secondary | ICD-10-CM | POA: Diagnosis not present

## 2021-06-28 DIAGNOSIS — I871 Compression of vein: Secondary | ICD-10-CM | POA: Diagnosis not present

## 2021-06-28 DIAGNOSIS — T82868A Thrombosis of vascular prosthetic devices, implants and grafts, initial encounter: Secondary | ICD-10-CM | POA: Diagnosis not present

## 2021-06-28 NOTE — Progress Notes (Signed)
Spoke with pt for pre-op call. Pt states she's had a heart murmur since she was a child, states it has never been an issue. Pt also states she prednisone induced diabetes after having a kidney transplant in 2011. She states it went away after she finished taking the prednisone.  Pt states she had Covid 2 weeks ago. Tested positive on 06/15/21. She states that her symptoms are gone. Pt states she has had a chronic cough for a long time.

## 2021-06-29 ENCOUNTER — Encounter (HOSPITAL_COMMUNITY): Payer: Self-pay | Admitting: Vascular Surgery

## 2021-06-29 ENCOUNTER — Ambulatory Visit (HOSPITAL_COMMUNITY)
Admission: RE | Admit: 2021-06-29 | Discharge: 2021-06-29 | Disposition: A | Discharge status: Home / Self Care | Payer: Medicare Other | Attending: Vascular Surgery | Admitting: Vascular Surgery

## 2021-06-29 ENCOUNTER — Ambulatory Visit (HOSPITAL_COMMUNITY): Payer: Medicare Other | Admitting: Anesthesiology

## 2021-06-29 ENCOUNTER — Ambulatory Visit (HOSPITAL_BASED_OUTPATIENT_CLINIC_OR_DEPARTMENT_OTHER): Payer: Medicare Other | Admitting: Physical Therapy

## 2021-06-29 ENCOUNTER — Encounter (HOSPITAL_COMMUNITY)
Admission: RE | Disposition: A | Discharge status: Home / Self Care | Payer: Self-pay | Source: Home / Self Care | Attending: Vascular Surgery

## 2021-06-29 DIAGNOSIS — I871 Compression of vein: Secondary | ICD-10-CM | POA: Diagnosis not present

## 2021-06-29 DIAGNOSIS — I120 Hypertensive chronic kidney disease with stage 5 chronic kidney disease or end stage renal disease: Secondary | ICD-10-CM | POA: Diagnosis not present

## 2021-06-29 DIAGNOSIS — Z992 Dependence on renal dialysis: Secondary | ICD-10-CM | POA: Insufficient documentation

## 2021-06-29 DIAGNOSIS — Z8616 Personal history of COVID-19: Secondary | ICD-10-CM | POA: Insufficient documentation

## 2021-06-29 DIAGNOSIS — Z888 Allergy status to other drugs, medicaments and biological substances status: Secondary | ICD-10-CM | POA: Diagnosis not present

## 2021-06-29 DIAGNOSIS — T82868A Thrombosis of vascular prosthetic devices, implants and grafts, initial encounter: Secondary | ICD-10-CM | POA: Insufficient documentation

## 2021-06-29 DIAGNOSIS — Y841 Kidney dialysis as the cause of abnormal reaction of the patient, or of later complication, without mention of misadventure at the time of the procedure: Secondary | ICD-10-CM | POA: Diagnosis not present

## 2021-06-29 DIAGNOSIS — Z885 Allergy status to narcotic agent status: Secondary | ICD-10-CM | POA: Insufficient documentation

## 2021-06-29 DIAGNOSIS — N186 End stage renal disease: Secondary | ICD-10-CM | POA: Insufficient documentation

## 2021-06-29 DIAGNOSIS — Z882 Allergy status to sulfonamides status: Secondary | ICD-10-CM | POA: Insufficient documentation

## 2021-06-29 DIAGNOSIS — E1122 Type 2 diabetes mellitus with diabetic chronic kidney disease: Secondary | ICD-10-CM | POA: Diagnosis not present

## 2021-06-29 HISTORY — PX: PATCH ANGIOPLASTY: SHX6230

## 2021-06-29 HISTORY — DX: Unspecified osteoarthritis, unspecified site: M19.90

## 2021-06-29 HISTORY — DX: Sleep apnea, unspecified: G47.30

## 2021-06-29 HISTORY — DX: COVID-19: U07.1

## 2021-06-29 HISTORY — PX: THROMBECTOMY AND REVISION OF ARTERIOVENTOUS (AV) GORETEX  GRAFT: SHX6120

## 2021-06-29 LAB — POCT I-STAT, CHEM 8
BUN: 70 mg/dL — ABNORMAL HIGH (ref 8–23)
Calcium, Ion: 1.04 mmol/L — ABNORMAL LOW (ref 1.15–1.40)
Chloride: 97 mmol/L — ABNORMAL LOW (ref 98–111)
Creatinine, Ser: 11 mg/dL — ABNORMAL HIGH (ref 0.44–1.00)
Glucose, Bld: 75 mg/dL (ref 70–99)
HCT: 30 % — ABNORMAL LOW (ref 36.0–46.0)
Hemoglobin: 10.2 g/dL — ABNORMAL LOW (ref 12.0–15.0)
Potassium: 5 mmol/L (ref 3.5–5.1)
Sodium: 133 mmol/L — ABNORMAL LOW (ref 135–145)
TCO2: 29 mmol/L (ref 22–32)

## 2021-06-29 SURGERY — THROMBECTOMY AND REVISION OF ARTERIOVENTOUS (AV) GORETEX  GRAFT
Anesthesia: Monitor Anesthesia Care | Laterality: Left

## 2021-06-29 MED ORDER — LIDOCAINE 2% (20 MG/ML) 5 ML SYRINGE
INTRAMUSCULAR | Status: AC
  Filled 2021-06-29: qty 5

## 2021-06-29 MED ORDER — FENTANYL CITRATE (PF) 250 MCG/5ML IJ SOLN
INTRAMUSCULAR | Status: DC | PRN
  Administered 2021-06-29 (×2): 50 ug via INTRAVENOUS

## 2021-06-29 MED ORDER — CEFAZOLIN SODIUM-DEXTROSE 2-4 GM/100ML-% IV SOLN
2.0000 g | INTRAVENOUS | Status: AC
  Administered 2021-06-29: 2 g via INTRAVENOUS

## 2021-06-29 MED ORDER — CHLORHEXIDINE GLUCONATE 0.12 % MT SOLN: 15.0000 mL | Freq: Once | OROMUCOSAL | Status: AC

## 2021-06-29 MED ORDER — SODIUM CHLORIDE 0.9 % IV SOLN: INTRAVENOUS | Status: DC | PRN

## 2021-06-29 MED ORDER — ONDANSETRON HCL 4 MG/2ML IJ SOLN
INTRAMUSCULAR | Status: DC | PRN
  Administered 2021-06-29: 4 mg via INTRAVENOUS

## 2021-06-29 MED ORDER — ORAL CARE MOUTH RINSE
15.0000 mL | Freq: Once | OROMUCOSAL | Status: AC
  Administered 2021-06-29: 15 mL via OROMUCOSAL

## 2021-06-29 MED ORDER — 0.9 % SODIUM CHLORIDE (POUR BTL) OPTIME
TOPICAL | Status: DC | PRN
  Administered 2021-06-29: 1000 mL

## 2021-06-29 MED ORDER — MIDAZOLAM HCL 2 MG/2ML IJ SOLN
INTRAMUSCULAR | Status: DC | PRN
  Administered 2021-06-29 (×2): 1 mg via INTRAVENOUS

## 2021-06-29 MED ORDER — OXYCODONE-ACETAMINOPHEN 5-325 MG PO TABS: 1.0000 | ORAL_TABLET | Freq: Four times a day (QID) | ORAL | 0 refills | Status: DC | PRN

## 2021-06-29 MED ORDER — CHLORHEXIDINE GLUCONATE 4 % EX LIQD
60.0000 mL | Freq: Once | CUTANEOUS | Status: DC
Start: 2021-06-29 — End: 2021-06-29

## 2021-06-29 MED ORDER — HEPARIN SODIUM (PORCINE) 1000 UNIT/ML IJ SOLN
INTRAMUSCULAR | Status: AC
  Filled 2021-06-29: qty 1

## 2021-06-29 MED ORDER — SODIUM CHLORIDE 0.9 % IV SOLN: INTRAVENOUS | Status: DC

## 2021-06-29 MED ORDER — ONDANSETRON HCL 4 MG/2ML IJ SOLN
INTRAMUSCULAR | Status: AC
  Filled 2021-06-29: qty 2

## 2021-06-29 MED ORDER — PROPOFOL 10 MG/ML IV BOLUS
INTRAVENOUS | Status: AC
  Filled 2021-06-29: qty 20

## 2021-06-29 MED ORDER — FENTANYL CITRATE (PF) 250 MCG/5ML IJ SOLN
INTRAMUSCULAR | Status: AC
  Filled 2021-06-29: qty 5

## 2021-06-29 MED ORDER — PHENYLEPHRINE 40 MCG/ML (10ML) SYRINGE FOR IV PUSH (FOR BLOOD PRESSURE SUPPORT)
PREFILLED_SYRINGE | INTRAVENOUS | Status: AC
  Filled 2021-06-29: qty 10

## 2021-06-29 MED ORDER — CEFAZOLIN SODIUM-DEXTROSE 2-4 GM/100ML-% IV SOLN
INTRAVENOUS | Status: AC
  Filled 2021-06-29: qty 100

## 2021-06-29 MED ORDER — PROPOFOL 10 MG/ML IV BOLUS
INTRAVENOUS | Status: DC | PRN
  Administered 2021-06-29: 30 mg via INTRAVENOUS

## 2021-06-29 MED ORDER — CHLORHEXIDINE GLUCONATE 4 % EX LIQD: 60.0000 mL | Freq: Once | CUTANEOUS | Status: DC

## 2021-06-29 MED ORDER — CHLORHEXIDINE GLUCONATE 0.12 % MT SOLN
OROMUCOSAL | Status: AC
  Filled 2021-06-29: qty 15

## 2021-06-29 MED ORDER — MIDAZOLAM HCL 2 MG/2ML IJ SOLN
INTRAMUSCULAR | Status: AC
  Filled 2021-06-29: qty 2

## 2021-06-29 MED ORDER — HEPARIN SODIUM (PORCINE) 1000 UNIT/ML IJ SOLN
INTRAMUSCULAR | Status: DC | PRN
  Administered 2021-06-29: 5000 [IU] via INTRAVENOUS

## 2021-06-29 MED ORDER — HEPARIN 6000 UNIT IRRIGATION SOLUTION
Status: DC | PRN
  Administered 2021-06-29: 1

## 2021-06-29 MED ORDER — LIDOCAINE-EPINEPHRINE (PF) 1 %-1:200000 IJ SOLN
INTRAMUSCULAR | Status: AC
  Filled 2021-06-29: qty 30

## 2021-06-29 MED ORDER — PHENYLEPHRINE 40 MCG/ML (10ML) SYRINGE FOR IV PUSH (FOR BLOOD PRESSURE SUPPORT)
PREFILLED_SYRINGE | INTRAVENOUS | Status: DC | PRN
  Administered 2021-06-29: 40 ug via INTRAVENOUS
  Administered 2021-06-29: 80 ug via INTRAVENOUS

## 2021-06-29 MED ORDER — PROPOFOL 500 MG/50ML IV EMUL
INTRAVENOUS | Status: DC | PRN
  Administered 2021-06-29: 25 ug/kg/min via INTRAVENOUS

## 2021-06-29 MED ORDER — LIDOCAINE-EPINEPHRINE (PF) 1 %-1:200000 IJ SOLN
INTRAMUSCULAR | Status: DC | PRN
  Administered 2021-06-29: 10 mL

## 2021-06-29 MED ORDER — HEPARIN 6000 UNIT IRRIGATION SOLUTION
Status: AC
  Filled 2021-06-29: qty 500

## 2021-06-29 SURGICAL SUPPLY — 63 items
ADH SKN CLS APL DERMABOND .7 (GAUZE/BANDAGES/DRESSINGS) ×3
APL PRP STRL LF DISP 70% ISPRP (MISCELLANEOUS) ×3
APL SKNCLS STERI-STRIP NONHPOA (GAUZE/BANDAGES/DRESSINGS) ×3
ARMBAND PINK RESTRICT EXTREMIT (MISCELLANEOUS) ×8 IMPLANT
BAG COUNTER SPONGE SURGICOUNT (BAG) ×4 IMPLANT
BAG DECANTER FOR FLEXI CONT (MISCELLANEOUS) ×4 IMPLANT
BAG SPNG CNTER NS LX DISP (BAG) ×3
BENZOIN TINCTURE PRP APPL 2/3 (GAUZE/BANDAGES/DRESSINGS) ×4 IMPLANT
BIOPATCH RED 1 DISK 7.0 (GAUZE/BANDAGES/DRESSINGS) ×4 IMPLANT
CANISTER SUCT 3000ML PPV (MISCELLANEOUS) ×4 IMPLANT
CATH EMB 4FR 40CM (CATHETERS) ×2 IMPLANT
CATH PALINDROME-P 19CM W/VT (CATHETERS) IMPLANT
CATH PALINDROME-P 23CM W/VT (CATHETERS) IMPLANT
CATH PALINDROME-P 28CM W/VT (CATHETERS) IMPLANT
CHLORAPREP W/TINT 26 (MISCELLANEOUS) ×4 IMPLANT
CLIP VESOCCLUDE MED 6/CT (CLIP) ×4 IMPLANT
CLIP VESOCCLUDE SM WIDE 6/CT (CLIP) ×4 IMPLANT
COVER PROBE W GEL 5X96 (DRAPES) ×4 IMPLANT
COVER SURGICAL LIGHT HANDLE (MISCELLANEOUS) ×4 IMPLANT
DERMABOND ADVANCED (GAUZE/BANDAGES/DRESSINGS) ×1
DERMABOND ADVANCED .7 DNX12 (GAUZE/BANDAGES/DRESSINGS) ×1 IMPLANT
DRAPE C-ARM 42X72 X-RAY (DRAPES) ×4 IMPLANT
DRAPE CHEST BREAST 15X10 FENES (DRAPES) ×4 IMPLANT
ELECT REM PT RETURN 9FT ADLT (ELECTROSURGICAL) ×4
ELECTRODE REM PT RTRN 9FT ADLT (ELECTROSURGICAL) ×3 IMPLANT
GAUZE 4X4 16PLY ~~LOC~~+RFID DBL (SPONGE) ×4 IMPLANT
GAUZE SPONGE 4X4 12PLY STRL (GAUZE/BANDAGES/DRESSINGS) ×4 IMPLANT
GLOVE SURG POLYISO LF SZ8 (GLOVE) ×4 IMPLANT
GOWN STRL REUS W/ TWL LRG LVL3 (GOWN DISPOSABLE) ×6 IMPLANT
GOWN STRL REUS W/ TWL XL LVL3 (GOWN DISPOSABLE) ×3 IMPLANT
GOWN STRL REUS W/TWL LRG LVL3 (GOWN DISPOSABLE) ×8
GOWN STRL REUS W/TWL XL LVL3 (GOWN DISPOSABLE) ×4
INSERT FOGARTY SM (MISCELLANEOUS) ×6 IMPLANT
KIT BASIN OR (CUSTOM PROCEDURE TRAY) ×4 IMPLANT
KIT PALINDROME-P 55CM (CATHETERS) IMPLANT
KIT TURNOVER KIT B (KITS) ×4 IMPLANT
NDL 18GX1X1/2 (RX/OR ONLY) (NEEDLE) ×2 IMPLANT
NDL HYPO 25GX1X1/2 BEV (NEEDLE) ×2 IMPLANT
NEEDLE 18GX1X1/2 (RX/OR ONLY) (NEEDLE) ×4 IMPLANT
NEEDLE HYPO 25GX1X1/2 BEV (NEEDLE) ×4 IMPLANT
NS IRRIG 1000ML POUR BTL (IV SOLUTION) ×4 IMPLANT
PACK CV ACCESS (CUSTOM PROCEDURE TRAY) ×4 IMPLANT
PACK SURGICAL SETUP 50X90 (CUSTOM PROCEDURE TRAY) ×4 IMPLANT
PAD ARMBOARD 7.5X6 YLW CONV (MISCELLANEOUS) ×8 IMPLANT
PATCH VASC XENOSURE 1CMX6CM (Vascular Products) ×4 IMPLANT
PATCH VASC XENOSURE 1X6 (Vascular Products) ×1 IMPLANT
SET MICROPUNCTURE 5F STIFF (MISCELLANEOUS) ×4 IMPLANT
SLING ARM FOAM STRAP LRG (SOFTGOODS) IMPLANT
SOAP 2 % CHG 4 OZ (WOUND CARE) ×4 IMPLANT
STRIP CLOSURE SKIN 1/2X4 (GAUZE/BANDAGES/DRESSINGS) ×4 IMPLANT
SUT ETHILON 3 0 PS 1 (SUTURE) ×4 IMPLANT
SUT MNCRL AB 4-0 PS2 18 (SUTURE) ×4 IMPLANT
SUT PROLENE 6 0 BV (SUTURE) ×8 IMPLANT
SUT VIC AB 3-0 SH 27 (SUTURE) ×8
SUT VIC AB 3-0 SH 27X BRD (SUTURE) ×6 IMPLANT
SYR 10ML LL (SYRINGE) ×4 IMPLANT
SYR 20ML LL LF (SYRINGE) ×8 IMPLANT
SYR 5ML LL (SYRINGE) ×4 IMPLANT
SYR CONTROL 10ML LL (SYRINGE) ×4 IMPLANT
TOWEL GREEN STERILE (TOWEL DISPOSABLE) ×4 IMPLANT
TOWEL GREEN STERILE FF (TOWEL DISPOSABLE) ×8 IMPLANT
UNDERPAD 30X36 HEAVY ABSORB (UNDERPADS AND DIAPERS) ×4 IMPLANT
WATER STERILE IRR 1000ML POUR (IV SOLUTION) ×4 IMPLANT

## 2021-06-29 NOTE — Progress Notes (Signed)
Pt made maureen/pa aware of numbness in l 5th finger

## 2021-06-29 NOTE — H&P (Signed)
[facility] [place]  ASSESSMENT / PLAN: 65 y.o. female with thrombosed left arm arteriovenous graft.  She had attempted percutaneous thrombectomy yesterday at [facility].  The size match between the venous anastomosis and the native axillary vein was felt to be too large, and the patient referred for open thrombectomy and venous anastomotic revision.  Patient is understanding of the rationale and conduct of the operation.  I counseled her that if we are unable to restore flow through the graft she would need a tunneled dialysis catheter.  She is understanding.  To OR for AV graft revision.  CHIEF COMPLAINT: thrombosed left arm graft  HISTORY OF PRESENT ILLNESS: Kimberly [name] is a 65 y.o. female who presents to short stay today for evaluation of a left arm thrombosed arteriovenous graft.  She underwent attempted percutaneous thrombectomy with Dr. Augustin Coupe yesterday at [facility].  Dr. Augustin Coupe was concerned about the size match between the venous anastomosis and the native vein and the risk of embolizing intragraft thrombus.  The patient last dialyzed Tuesday. She is no distress. She has no difficulty breathing.  Past Medical History:  Diagnosis Date   Anemia    Arthritis    Asthma    Complication of anesthesia    Slow to awaken and AMS- took 1 month to memory and recall to return   COVID    Diverticulosis    Dyspnea    with exertion. when walking up stairs or walking a distance   First degree AV block    GERD (gastroesophageal reflux disease)    Heart murmur    child   History of blood transfusion    History of chronic cough    History of hiatal hernia    History of hoarseness    Hypertension    history; resolved on dialysis   Hypothyroidism    IgA nephropathy    ESRD - Hemo TTH Sat   Pneumonia    Premature atrial beat    Premature ventricular beat    Renal transplant failure and rejection 2012   Shingles 03/2011   Sleep apnea    uses a mouth guard   Stroke  [facility]) October 2010, March 2013   cerebral aneurysm, 07/2017    Past Surgical History:  Procedure Laterality Date   AV FISTULA PLACEMENT Left 05/20/2018   Procedure: INSERTION OF ARTERIOVENOUS (AV) GORE-TEX GRAFT LEFT  ARM;  Surgeon: [name], MD;  Location: [place];  Service: Vascular;  Laterality: Left;   CAPD INSERTION  08/25/2008   open, Dr [name]   CAPD INSERTION N/A 12/14/2013   Procedure: LAPAROSCOPIC INSERTION CONTINUOUS AMBULATORY PERITONEAL DIALYSIS  (CAPD) CATHETER;  Surgeon: [name], MD;  Location: [place];  Service: General;  Laterality: N/A;   CAPD REMOVAL  03/22/2010   Dr [name]   COLONOSCOPY     ESOPHAGOGASTRODUODENOSCOPY     EYE SURGERY Bilateral    radial keratotomy   Hemodialysis catheter Right 02/13/2018   INSERTION OF MESH N/A 12/14/2013   Procedure: INSERTION OF MESH;  Surgeon: [name], MD;  Location: [place];  Service: General;  Laterality: N/A;   KIDNEY TRANSPLANT Right 01/2010   [facility]   LAPAROSCOPIC LYSIS OF ADHESIONS N/A 12/14/2013   Procedure: LAPAROSCOPIC LYSIS OF ADHESIONS;  Surgeon: [name], MD;  Location: [place];  Service: General;  Laterality: N/A;   TOTAL HIP ARTHROPLASTY Left 02/06/2019   Procedure: TOTAL HIP ARTHROPLASTY ANTERIOR APPROACH;  Surgeon: Rod Can, MD;  Location: [place];  Service: Orthopedics;  Laterality: Left;   UMBILICAL HERNIA REPAIR  10/25/2009   open with mesh,  Dr [name]   URETER REVISION  04/2011   [facility]   VENTRAL HERNIA REPAIR N/A 12/14/2013   Procedure: LAPAROSCOPIC VENTRAL HERNIA;  Surgeon: [name], MD;  Location: [place];  Service: General;  Laterality: N/A;   [place] (VATS)/WEDGE RESECTION Left 09/01/2014   Procedure: VIDEO ASSISTED THORACOSCOPY (VATS)/WEDGE RESECTION;  Surgeon: [name], MD;  Location: [place];  Service: Thoracic;  Laterality: Left;   VIDEO BRONCHOSCOPY Bilateral 07/05/2014   Procedure: VIDEO BRONCHOSCOPY WITH FLUORO;  Surgeon: [name], MD;  Location: [facility];  Service: Cardiopulmonary;  Laterality: Bilateral;    Family History  Problem Relation Age of Onset   Coronary artery disease Mother    Emphysema Mother        smoked   Ovarian cancer Mother    Heart disease Mother    Stroke Maternal Uncle    Diabetes Maternal Uncle    Kidney disease Maternal Uncle    Liver disease Father     Social History   Socioeconomic History   Marital status: Married    Spouse name: Not on file   Number of children: 1   Years of education: Not on file   Highest education level: Not on file  Occupational History   Occupation: retired  Tobacco Use   Smoking status: Never   Smokeless tobacco: Never  Vaping Use   Vaping Use: Never used  Substance and Sexual Activity   Alcohol use: Not Currently    Comment: ocassional- not weekly   Drug use: No   Sexual activity: Yes    Birth control/protection: Post-menopausal  Other Topics Concern   Not on file  Social History Narrative   Pt lives in [place].  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   Social Determinants of Health   Financial Resource Strain: Not on file  Food Insecurity: Not on file  Transportation Needs: Not on file  Physical Activity: Not on file  Stress: Not on file  Social Connections: Not on file  Intimate Partner Violence: Not on file    Allergies  Allergen Reactions   Mircera [Methoxy Polyethylene Glycol-Epoetin Beta] Other (See Comments)    Severe joint and muscle pain   Dapsone Rash and Other (See Comments)    Pain in feet   Pregabalin Rash and Other (See Comments)    Fever - reaction to Lyrica   Sulfonamide Derivatives Rash   Tramadol Rash and Other (See Comments)    Rash on cheek    Current Facility-Administered Medications  Medication Dose Route Frequency Provider Last Rate Last Admin   0.9 %  sodium chloride infusion   Intravenous Continuous Cherre Robins, MD       ceFAZolin (ANCEF) 2-4 GM/100ML-%  IVPB            ceFAZolin (ANCEF) IVPB 2g/100 mL premix  2 g Intravenous 30 min Pre-Op Cherre Robins, MD       chlorhexidine (HIBICLENS) 4 % liquid 4 application  60 mL Topical Once Cherre Robins, MD       And   chlorhexidine (HIBICLENS) 4 % liquid 4 application  60 mL Topical Once Cherre Robins, MD       chlorhexidine (PERIDEX) 0.12 % solution 15 mL  15 mL Mouth/Throat Once Janeece Riggers, MD       Or  MEDLINE mouth rinse  15 mL Mouth Rinse Once Janeece Riggers, MD        REVIEW OF SYSTEMS:  [X]  denotes positive finding, [ ]  denotes negative finding Cardiac  Comments:  Chest pain or chest pressure:    Shortness of breath upon exertion:    Short of breath when lying flat:    Irregular heart rhythm:        Vascular    Pain in calf, thigh, or hip brought on by ambulation:    Pain in feet at night that wakes you up from your sleep:     Blood clot in your veins:    Leg swelling:         Pulmonary    Oxygen at home:    Productive cough:     Wheezing:         Neurologic    Sudden weakness in arms or legs:     Sudden numbness in arms or legs:     Sudden onset of difficulty speaking or slurred speech:    Temporary loss of vision in one eye:     Problems with dizziness:         Gastrointestinal    Blood in stool:     Vomited blood:         Genitourinary    Burning when urinating:     Blood in urine:        Psychiatric    Major depression:         Hematologic    Bleeding problems:    Problems with blood clotting too easily:        Skin    Rashes or ulcers:        Constitutional    Fever or chills:      PHYSICAL EXAM Vitals:   06/29/21 1033  BP: (!) 189/77  Pulse: (!) 58  Resp: 18  Temp: 98.3 F (36.8 C)  TempSrc: Oral  SpO2: 99%  Weight: 50.8 kg  Height: 5\' 5"  (1.651 m)    Constitutional:  well appearing. no distress. Appears well nourished.  Neurologic: CN intact. no focal findings. no sensory loss. Psychiatric:  Mood and affect symmetric and  appropriate. Eyes:  No icterus. No conjunctival pallor. Ears, nose, throat:  mucous membranes moist. Midline trachea.  Cardiac: regular rate and rhythm.  Respiratory:  unlabored. Abdominal:  soft, non-tender, non-distended.  Peripheral vascular: LUE AVG thrombosed Extremity: no edema. no cyanosis. no pallor.  Skin: no gangrene. no ulceration.  Lymphatic: no Stemmer's sign. no palpable lymphadenopathy.  PERTINENT LABORATORY AND RADIOLOGIC DATA  Most recent CBC CBC Latest Ref Rng & Units 06/19/2019 02/09/2019 02/09/2019  WBC 4.0 - 10.5 K/uL 4.0 3.6(L) 3.6(L)  Hemoglobin 12.0 - 15.0 g/dL 9.7(L) 8.7(L) 7.1(L)  Hematocrit 36.0 - 46.0 % 30.9(L) 26.5(L) 22.4(L)  Platelets 150 - 400 K/uL 264 206 164     Most recent CMP CMP Latest Ref Rng & Units 06/19/2019 02/09/2019 02/09/2019  Glucose 70 - 99 mg/dL 86 100(H) 92  BUN 8 - 23 mg/dL 28(H) 59(H) 55(H)  Creatinine 0.44 - 1.00 mg/dL 5.64(H) 8.24(H) 7.81(H)  Sodium 135 - 145 mmol/L 135 134(L) 133(L)  Potassium 3.5 - 5.1 mmol/L 3.3(L) 4.8 4.8  Chloride 98 - 111 mmol/L 94(L) 96(L) 95(L)  CO2 22 - 32 mmol/L 28 24 26   Calcium 8.9 - 10.3 mg/dL 9.8 8.2(L) 8.1(L)  Total Protein 6.5 - 8.1 g/dL - - -  Total Bilirubin 0.3 - 1.2 mg/dL - - -  Alkaline Phos 38 - 126 U/L - - -  AST 15 - 41 U/L - - -  ALT 0 - 44 U/L - - -    Renal function CrCl cannot be calculated (Patient's most recent lab result is older than the maximum 21 days allowed.).  Hgb A1c MFr Bld (%)  Date Value  04/15/2013 5.1    LDL Cholesterol  Date Value Ref Range Status  11/30/2011 76 0 - 99 mg/dL Final    Comment:           Total Cholesterol/HDL:CHD Risk Coronary Heart Disease Risk Table                     Men   Women  1/2 Average Risk   3.4   3.3  Average Risk       5.0   4.4  2 X Average Risk   9.6   7.1  3 X Average Risk  23.4   11.0        Use the calculated Patient Ratio above and the CHD Risk Table to determine the patient's CHD Risk.        ATP III  CLASSIFICATION (LDL):  <100     mg/dL   Optimal  100-129  mg/dL   Near or Above                    Optimal  130-159  mg/dL   Borderline  160-189  mg/dL   High  >190     mg/dL   Very High     Ryka Beighley N. Stanford Breed, MD [facility] Phone Number: [phone] 06/29/2021 11:43 AM  Total time spent on preparing this encounter including chart review, data review, collecting history, examining the patient, coordinating care for this established patient, 40 minutes.  Portions of this report may have been transcribed using voice recognition software.  Every effort has been made to ensure accuracy; however, inadvertent computerized transcription errors may still be present.

## 2021-06-29 NOTE — Transfer of Care (Signed)
Immediate Anesthesia Transfer of Care Note  Patient: Kimberly [name]  Procedure(s) Performed: REVISION OF LEFT UPPER EXTREMITY ARTERIOVENOUS GORETEX GRAFT WITH THROMBECTOMY (Left) PATCH ANGIOPLASTY  Patient Location: PACU  Anesthesia Type:MAC  Level of Consciousness: awake and alert   Airway & Oxygen Therapy: Patient Spontanous Breathing  Post-op Assessment: Report given to RN, Post -op Vital signs reviewed and stable and Patient moving all extremities X 4  Post vital signs: Reviewed and stable  Last Vitals:  Vitals Value Taken Time  BP 128/51 06/29/21 1425  Temp 36.6 C 06/29/21 1425  Pulse 51 06/29/21 1431  Resp 15 06/29/21 1431  SpO2 98 % 06/29/21 1431  Vitals shown include unvalidated device data.  Last Pain:  Vitals:   06/29/21 1425  TempSrc:   PainSc: 0-No pain      Patients Stated Pain Goal: 3 (76/72/09 4709)  Complications: No notable events documented.

## 2021-06-29 NOTE — Op Note (Signed)
DATE OF SERVICE: 06/29/2021  PATIENT:  Kimberly [name]  65 y.o. female  PRE-OPERATIVE DIAGNOSIS: End-stage renal disease, thrombosed left upper extremity arteriovenous graft  POST-OPERATIVE DIAGNOSIS:  Same  PROCEDURE:   1) reexposures of left upper extremity arteriovenous graft venous anastomosis 2) arteriovenous graft thrombectomy 3) arteriovenous graft venous anastomosis patch angioplasty (bovine pericardium)  SURGEON:  Surgeon(s) and Role:    * Cherre Robins, MD - Primary  ASSISTANT: Gerri Lins, PA-C  An assistant was required to facilitate exposure and expedite the case.  ANESTHESIA:   local and MAC  EBL: 185m  BLOOD ADMINISTERED:none  DRAINS: none   LOCAL MEDICATIONS USED:  LIDOCAINE   SPECIMEN: None  COUNTS: confirmed correct.  TOURNIQUET: None  PATIENT DISPOSITION:  PACU - hemodynamically stable.   Delay start of Pharmacological VTE agent (>24hrs) due to surgical blood loss or risk of bleeding: no  INDICATION FOR PROCEDURE: ATaleah BellantoniCouncil is a 65y.o. female with end-stage renal disease, dialyzing through a left upper extremity arteriovenous graft.  Her last successful dialysis session was Tuesday.  She presented to [facility] yesterday for graft thrombectomy.  They were unable to perform thrombectomy given the thrombus burden in the large native vein diameter. After careful discussion of risks, benefits, and alternatives the patient was offered open thrombectomy and venous anastomotic revision. The patient understood and wished to proceed.  OPERATIVE FINDINGS: Successful thrombectomy.  Significant stenosis in the native vein immediately distal to the venous anastomosis.  Patch angioplasty performed with good result.  Strong thrill at completion.  Doppler flow at the wrist at completion.  DESCRIPTION OF PROCEDURE: After identification of the patient in the pre-operative holding area, the patient was transferred to the operating room. The patient was  positioned supine on the operating room table. Anesthesia was induced. The left arm was prepped and draped in standard fashion. A surgical pause was performed confirming correct patient, procedure, and operative location.  The previous, longitudinal incision in the left axilla was reopened sharply with a 10 blade.  The incision was carried down through subcutaneous tissue using Bovie electrocautery until the arteriovenous graft was identified.  The graft, venous anastomosis, and venous branches near the venous anastomosis were all skeletonized sharply and encircled with Silastic Vesseloops.  A longitudinal arteriotomy was made over the graft across the venous anastomosis onto the native vein using an 11 blade and Potts scissors.  Thrombectomy was performed of the graft using a #4 Fogarty embolectomy balloon catheter.  This achieved torrential inflow after 1 pass.  A plug was noted to be retrieved.  A second pass retrieved no thrombus.  The graft was clamped.  Pleurectomy was performed of the axillary vein, minimal thrombus returned.  Significant stenosis was noted at the site and was likely the reason of graft failure.  A generous brachial vein was noted peripheral to the venous anastomosis coursing towards the hand.  This had a large burden of thrombus.  This is likely the main source of outflow for the graft.  This vein had a heavy burden of thrombus.  This was removed with a combination of Fogarty embolectomy and manual retrieval.  Excellent backbleeding was noted from all venous branches.  A bovine pericardial patch was brought onto the field and used to sew a patch angioplasty onto the graft and venotomy.  This was done using continuous running suture of 6-0 Prolene.  Immediately prior to completion the angioplasty was flushed and de-aired.  Clamps were released on the veins and graft.  Hemostasis was achieved in the patch.  Hemostasis was achieved in the surgical bed.  The repair was interrogated with  Doppler.  A strong Doppler thrill was heard over the brachial/axillary vein.  Strong Doppler flow was heard at the left radial artery.  Satisfied we ended the case here.  Incision was closed in layers using 3-0 Vicryl and 4-0 Monocryl.  Dermabond was applied  Upon completion of the case instrument and sharps counts were confirmed correct. The patient was transferred to the PACU in good condition. I was present for all portions of the procedure.  Yevonne Aline. Stanford Breed, MD [facility] Phone Number: [phone] 06/29/2021 2:14 PM

## 2021-06-29 NOTE — Anesthesia Postprocedure Evaluation (Signed)
Anesthesia Post Note  Patient: Kimberly [name]  Procedure(s) Performed: REVISION OF LEFT UPPER EXTREMITY ARTERIOVENOUS GORETEX GRAFT WITH THROMBECTOMY (Left) PATCH ANGIOPLASTY     Patient location during evaluation: PACU Anesthesia Type: MAC Level of consciousness: awake and alert Pain management: pain level controlled Vital Signs Assessment: post-procedure vital signs reviewed and stable Respiratory status: spontaneous breathing, nonlabored ventilation, respiratory function stable and patient connected to nasal cannula oxygen Cardiovascular status: blood pressure returned to baseline and stable Postop Assessment: no apparent nausea or vomiting Anesthetic complications: no   No notable events documented.  Last Vitals:  Vitals:   06/29/21 1425 06/29/21 1437  BP: (!) 128/51 118/75  Pulse: 64   Resp: 16   Temp: 36.6 C 36.4 C  SpO2:      Last Pain:  Vitals:   06/29/21 1437  TempSrc:   PainSc: 3                  Nazar Kuan P Alejandra Barna

## 2021-06-29 NOTE — Anesthesia Procedure Notes (Signed)
Procedure Name: MAC Date/Time: 06/29/2021 12:40 PM Performed by: Harden Mo, CRNA Pre-anesthesia Checklist: Patient identified, Emergency Drugs available, Suction available and Patient being monitored Patient Re-evaluated:Patient Re-evaluated prior to induction Oxygen Delivery Method: Simple face mask Preoxygenation: Pre-oxygenation with 100% oxygen Induction Type: IV induction Placement Confirmation: positive ETCO2 and breath sounds checked- equal and bilateral Dental Injury: Teeth and Oropharynx as per pre-operative assessment

## 2021-06-29 NOTE — Anesthesia Preprocedure Evaluation (Addendum)
Anesthesia Evaluation  Patient identified by MRN, date of birth, ID band Patient awake    Reviewed: Allergy & Precautions, NPO status , Patient's Chart, lab work & pertinent test results  History of Anesthesia Complications Negative for: history of anesthetic complications  Airway Mallampati: II  TM Distance: >3 FB Neck ROM: Full    Dental no notable dental hx.    Pulmonary asthma , sleep apnea ,    Pulmonary exam normal        Cardiovascular hypertension, Pt. on medications Normal cardiovascular exam  TTE 2018: EF 60-65%, RV appears hypertrophied    Neuro/Psych CVA negative psych ROS   GI/Hepatic Neg liver ROS, hiatal hernia, GERD  ,  Endo/Other  diabetes, Type 2Hypothyroidism   Renal/GU ESRF and DialysisRenal disease (HD T/Th/Sa)  negative genitourinary   Musculoskeletal  (+) Arthritis ,   Abdominal   Peds  Hematology  (+) anemia , Hgb 9.7   Anesthesia Other Findings Day of surgery medications reviewed with patient.  Reproductive/Obstetrics negative OB ROS                            Anesthesia Physical Anesthesia Plan  ASA: 3  Anesthesia Plan: MAC   Post-op Pain Management:    Induction:   PONV Risk Score and Plan: Treatment may vary due to age or medical condition and Propofol infusion  Airway Management Planned: Natural Airway and Simple Face Mask  Additional Equipment: None  Intra-op Plan:   Post-operative Plan:   Informed Consent: I have reviewed the patients History and Physical, chart, labs and discussed the procedure including the risks, benefits and alternatives for the proposed anesthesia with the patient or authorized representative who has indicated his/her understanding and acceptance.       Plan Discussed with: CRNA  Anesthesia Plan Comments:        Anesthesia Quick Evaluation

## 2021-06-29 NOTE — Discharge Instructions (Signed)
Do not stick at new incision site for HD for 4 weeks.  You may use the graft for HD below new incision.  You may shower in 48 hours as needed. F/U as needed.

## 2021-06-30 DIAGNOSIS — Z992 Dependence on renal dialysis: Secondary | ICD-10-CM | POA: Diagnosis not present

## 2021-06-30 DIAGNOSIS — D689 Coagulation defect, unspecified: Secondary | ICD-10-CM | POA: Diagnosis not present

## 2021-06-30 DIAGNOSIS — N186 End stage renal disease: Secondary | ICD-10-CM | POA: Diagnosis not present

## 2021-06-30 DIAGNOSIS — D631 Anemia in chronic kidney disease: Secondary | ICD-10-CM | POA: Diagnosis not present

## 2021-06-30 DIAGNOSIS — N2581 Secondary hyperparathyroidism of renal origin: Secondary | ICD-10-CM | POA: Diagnosis not present

## 2021-07-02 ENCOUNTER — Encounter (HOSPITAL_COMMUNITY): Payer: Self-pay | Admitting: Vascular Surgery

## 2021-07-03 DIAGNOSIS — D631 Anemia in chronic kidney disease: Secondary | ICD-10-CM | POA: Diagnosis not present

## 2021-07-03 DIAGNOSIS — Z992 Dependence on renal dialysis: Secondary | ICD-10-CM | POA: Diagnosis not present

## 2021-07-03 DIAGNOSIS — N2581 Secondary hyperparathyroidism of renal origin: Secondary | ICD-10-CM | POA: Diagnosis not present

## 2021-07-03 DIAGNOSIS — D689 Coagulation defect, unspecified: Secondary | ICD-10-CM | POA: Diagnosis not present

## 2021-07-03 DIAGNOSIS — E039 Hypothyroidism, unspecified: Secondary | ICD-10-CM | POA: Diagnosis not present

## 2021-07-03 DIAGNOSIS — E1129 Type 2 diabetes mellitus with other diabetic kidney complication: Secondary | ICD-10-CM | POA: Diagnosis not present

## 2021-07-03 DIAGNOSIS — N186 End stage renal disease: Secondary | ICD-10-CM | POA: Diagnosis not present

## 2021-07-05 DIAGNOSIS — E1129 Type 2 diabetes mellitus with other diabetic kidney complication: Secondary | ICD-10-CM | POA: Diagnosis not present

## 2021-07-05 DIAGNOSIS — D689 Coagulation defect, unspecified: Secondary | ICD-10-CM | POA: Diagnosis not present

## 2021-07-05 DIAGNOSIS — D631 Anemia in chronic kidney disease: Secondary | ICD-10-CM | POA: Diagnosis not present

## 2021-07-05 DIAGNOSIS — Z992 Dependence on renal dialysis: Secondary | ICD-10-CM | POA: Diagnosis not present

## 2021-07-05 DIAGNOSIS — E039 Hypothyroidism, unspecified: Secondary | ICD-10-CM | POA: Diagnosis not present

## 2021-07-05 DIAGNOSIS — N186 End stage renal disease: Secondary | ICD-10-CM | POA: Diagnosis not present

## 2021-07-05 DIAGNOSIS — N2581 Secondary hyperparathyroidism of renal origin: Secondary | ICD-10-CM | POA: Diagnosis not present

## 2021-07-07 DIAGNOSIS — E1129 Type 2 diabetes mellitus with other diabetic kidney complication: Secondary | ICD-10-CM | POA: Diagnosis not present

## 2021-07-07 DIAGNOSIS — E039 Hypothyroidism, unspecified: Secondary | ICD-10-CM | POA: Diagnosis not present

## 2021-07-07 DIAGNOSIS — Z992 Dependence on renal dialysis: Secondary | ICD-10-CM | POA: Diagnosis not present

## 2021-07-07 DIAGNOSIS — N2581 Secondary hyperparathyroidism of renal origin: Secondary | ICD-10-CM | POA: Diagnosis not present

## 2021-07-07 DIAGNOSIS — D631 Anemia in chronic kidney disease: Secondary | ICD-10-CM | POA: Diagnosis not present

## 2021-07-07 DIAGNOSIS — N186 End stage renal disease: Secondary | ICD-10-CM | POA: Diagnosis not present

## 2021-07-07 DIAGNOSIS — D689 Coagulation defect, unspecified: Secondary | ICD-10-CM | POA: Diagnosis not present

## 2021-07-10 DIAGNOSIS — N2581 Secondary hyperparathyroidism of renal origin: Secondary | ICD-10-CM | POA: Diagnosis not present

## 2021-07-10 DIAGNOSIS — D631 Anemia in chronic kidney disease: Secondary | ICD-10-CM | POA: Diagnosis not present

## 2021-07-10 DIAGNOSIS — D689 Coagulation defect, unspecified: Secondary | ICD-10-CM | POA: Diagnosis not present

## 2021-07-10 DIAGNOSIS — Z992 Dependence on renal dialysis: Secondary | ICD-10-CM | POA: Diagnosis not present

## 2021-07-10 DIAGNOSIS — N186 End stage renal disease: Secondary | ICD-10-CM | POA: Diagnosis not present

## 2021-07-11 ENCOUNTER — Encounter (HOSPITAL_BASED_OUTPATIENT_CLINIC_OR_DEPARTMENT_OTHER): Payer: Medicare Other | Admitting: Physical Therapy

## 2021-07-12 ENCOUNTER — Telehealth: Payer: Self-pay

## 2021-07-12 DIAGNOSIS — D631 Anemia in chronic kidney disease: Secondary | ICD-10-CM | POA: Diagnosis not present

## 2021-07-12 DIAGNOSIS — Z992 Dependence on renal dialysis: Secondary | ICD-10-CM | POA: Diagnosis not present

## 2021-07-12 DIAGNOSIS — D689 Coagulation defect, unspecified: Secondary | ICD-10-CM | POA: Diagnosis not present

## 2021-07-12 DIAGNOSIS — N186 End stage renal disease: Secondary | ICD-10-CM | POA: Diagnosis not present

## 2021-07-12 DIAGNOSIS — N2581 Secondary hyperparathyroidism of renal origin: Secondary | ICD-10-CM | POA: Diagnosis not present

## 2021-07-12 NOTE — Telephone Encounter (Signed)
HD nurse from [facility] called for pt to move up her post op appt. Due to possible hematoma at access site. Area is hard and sometimes painful per HD nurse. Pt's appt has been moved to earlier in the week. No further questions/concerns at this time.

## 2021-07-13 ENCOUNTER — Ambulatory Visit (HOSPITAL_BASED_OUTPATIENT_CLINIC_OR_DEPARTMENT_OTHER): Payer: Medicare Other | Admitting: Physical Therapy

## 2021-07-14 DIAGNOSIS — D631 Anemia in chronic kidney disease: Secondary | ICD-10-CM | POA: Diagnosis not present

## 2021-07-14 DIAGNOSIS — Z992 Dependence on renal dialysis: Secondary | ICD-10-CM | POA: Diagnosis not present

## 2021-07-14 DIAGNOSIS — N2581 Secondary hyperparathyroidism of renal origin: Secondary | ICD-10-CM | POA: Diagnosis not present

## 2021-07-14 DIAGNOSIS — D689 Coagulation defect, unspecified: Secondary | ICD-10-CM | POA: Diagnosis not present

## 2021-07-14 DIAGNOSIS — N186 End stage renal disease: Secondary | ICD-10-CM | POA: Diagnosis not present

## 2021-07-16 ENCOUNTER — Ambulatory Visit (HOSPITAL_COMMUNITY)
Admission: RE | Admit: 2021-07-16 | Discharge: 2021-07-16 | Disposition: A | Discharge status: Home / Self Care | Payer: Medicare Other | Source: Ambulatory Visit | Attending: Surgery | Admitting: Surgery

## 2021-07-16 ENCOUNTER — Encounter: Payer: Self-pay | Admitting: Physician Assistant

## 2021-07-16 ENCOUNTER — Other Ambulatory Visit: Payer: Self-pay

## 2021-07-16 ENCOUNTER — Other Ambulatory Visit: Payer: Self-pay | Admitting: *Deleted

## 2021-07-16 ENCOUNTER — Ambulatory Visit (INDEPENDENT_AMBULATORY_CARE_PROVIDER_SITE_OTHER): Payer: Medicare Other | Admitting: Physician Assistant

## 2021-07-16 VITALS — BP 160/77 | HR 75 | Temp 97.9°F | Resp 16 | Ht 65.0 in | Wt 118.9 lb

## 2021-07-16 DIAGNOSIS — Z992 Dependence on renal dialysis: Secondary | ICD-10-CM

## 2021-07-16 DIAGNOSIS — N186 End stage renal disease: Secondary | ICD-10-CM | POA: Insufficient documentation

## 2021-07-16 NOTE — Progress Notes (Signed)
POST OPERATIVE OFFICE NOTE    CC:  F/u for surgery  HPI:  This is a 65 y.o. female who is s/p   1) reexposures of left upper extremity arteriovenous graft venous anastomosis 2) arteriovenous graft thrombectomy 3) arteriovenous graft venous anastomosis patch angioplasty (bovine pericardium) By Dr. Stanford Breed on 06/29/2021  Her original LUA AVG was placed on 05/20/2018 by Dr. Donzetta Matters.  Her graft had thrombosed.  She had an attempted percutaneous thrombectomy at [facility] on 06/29/2021.  The size match between the venous anastomosis and the native axillary vein was felt to be too large, and the patient referred for open thrombectomy and venous anastomotic revision.  Patient is understanding of the rationale and conduct of the operation.  I counseled her that if we are unable to restore flow through the graft she would need a tunneled dialysis catheter.  She is understanding.  To OR for AV graft revision.  Pt comes in today for follow up.    Pt states she does not have pain/numbness in the left hand.   She states she does have a hematoma around the incision in the axilla.  She states that she has a place on her graft that has gotten bigger with each session since surgery.  They are not sticking this area. Her graft is working well.    The pt is on dialysis T/T/S at [facility] location.   Allergies  Allergen Reactions   Mircera [Methoxy Polyethylene Glycol-Epoetin Beta] Other (See Comments)    Severe joint and muscle pain   Dapsone Rash and Other (See Comments)    Pain in feet   Pregabalin Rash and Other (See Comments)    Fever - reaction to Lyrica   Sulfonamide Derivatives Rash   Tramadol Rash and Other (See Comments)    Rash on cheek    Current Outpatient Medications  Medication Sig Dispense Refill   albuterol (VENTOLIN HFA) 108 (90 Base) MCG/ACT inhaler Inhale 2 puffs into the lungs daily as needed (Asthma).     amLODipine (NORVASC) 5 MG tablet Take 10 mg by mouth at bedtime.      aspirin EC 325 MG tablet Take 1 tablet (325 mg total) by mouth 2 (two) times a day. (Patient not taking: No sig reported) 30 tablet 0   budesonide-formoterol (SYMBICORT) 160-4.5 MCG/ACT inhaler Inhale 2 puffs into the lungs 2 (two) times daily.     calcium acetate (PHOSLO) 667 MG capsule Take 667-1,334 mg by mouth 3 (three) times daily with meals.     cinacalcet (SENSIPAR) 30 MG tablet Take 30 mg by mouth daily after supper.     EPOETIN ALFA IJ Inject into the skin See admin instructions. Inject  subcutaneously at dialysis as needed for low hemoglobin     Garlic 9528 MG CAPS Take 1,000 mg by mouth in the morning, at noon, in the evening, and at bedtime.     hydroxychloroquine (PLAQUENIL) 200 MG tablet Take 200 mg by mouth daily.     levothyroxine (SYNTHROID, LEVOTHROID) 50 MCG tablet Take 50-75 mcg by mouth See admin instructions. Take  1 1/2 tablets (75 mcg) by mouth daily on Tuesday and Thursday nights, take 1 tablet (50 mcg) by mouth daily on all other nights of the week     losartan (COZAAR) 100 MG tablet Take 100 mg by mouth daily.     Multiple Vitamins-Minerals (ZINC PO) Take 22 mg by mouth daily.     OVER THE COUNTER MEDICATION Take 4 capsules by mouth  2 (two) times a day. Juice plus - 1 fruit, 1 vegetable, 1 berries, 1 omega - twice daily     oxyCODONE-acetaminophen (PERCOCET/ROXICET) 5-325 MG tablet Take 1 tablet by mouth every 6 (six) hours as needed. 15 tablet 0   predniSONE (DELTASONE) 5 MG tablet Take 5 mg by mouth daily with breakfast.     Probiotic Product (PROBIOTIC DAILY PO) Take 1 tablet by mouth daily.     PSYLLIUM PO Take 3 capsules by mouth daily at 2 PM.      tiotropium (SPIRIVA) 18 MCG inhalation capsule Place 1 puff into inhaler and inhale daily.     traZODone (DESYREL) 50 MG tablet Take 50 mg by mouth at bedtime.     No current facility-administered medications for this visit.     ROS:  See HPI  Physical Exam:  Today's Vitals   07/16/21 0941  BP: (!) 160/77   Pulse: 75  Resp: 16  Temp: 97.9 F (36.6 C)  TempSrc: Oral  SpO2: 95%  Weight: 118 lb 14.4 oz (53.9 kg)  Height: 5\' 5"  (1.651 m)  PainSc: 1    Body mass index is 19.79 kg/m.   Incision:  healed nicely Extremities:   There is a palpable left radial pulse.   Motor and sensory are in tact.   There is a thrill/bruit present.  The fistula/graft is easily palpable    Dialysis Duplex on 07/16/2021: +--------------------+----------+-----------------+--------+  AVG                 PSV (cm/s)Flow Vol (mL/min)Describe  +--------------------+----------+-----------------+--------+  Native artery inflow   204                               +--------------------+----------+-----------------+--------+  Arterial anastomosis   596                               +--------------------+----------+-----------------+--------+  Prox graft             346                               +--------------------+----------+-----------------+--------+  Mid graft              277                               +--------------------+----------+-----------------+--------+  Distal graft           257                               +--------------------+----------+-----------------+--------+  Venous anastomosis     401                               +--------------------+----------+-----------------+--------+  Venous outflow         226                               +--------------------+----------+-----------------+--------+  Large hematoma seen at the venous out flow anastomosis. No active bleeding  seen within.   Summary:  Patent arteriovenous graft.   No evidence of active bleeding at venous anastomosis hematoma.  Assessment/Plan:  This is a 65 y.o. female who is s/p: 1) reexposures of left upper extremity arteriovenous graft venous anastomosis 2) arteriovenous graft thrombectomy 3) arteriovenous graft venous anastomosis patch angioplasty (bovine  pericardium) By Dr. Stanford Breed on 06/29/2021  -the pt does not have evidence of steal. -the graft is working well.  Would continue to stick away from area on the graft seen above.  Dr. Stanford Breed examined pt and does not feel we should do anything with this currently.  If it gets worse or worrisome, she will call us and we will be glad to see her.  -she does have a moderate hematoma in the left axilla.  Discussed with pt that it could take months for this to be absorbed by the body.  Would not consider surgical drainage of this  -the pt will follow up as needed.    Leontine Locket, [facility] [phone]  Clinic MD:  Pt seen and examined with Dr. Stanford Breed

## 2021-07-17 DIAGNOSIS — N2581 Secondary hyperparathyroidism of renal origin: Secondary | ICD-10-CM | POA: Diagnosis not present

## 2021-07-17 DIAGNOSIS — Z992 Dependence on renal dialysis: Secondary | ICD-10-CM | POA: Diagnosis not present

## 2021-07-17 DIAGNOSIS — N186 End stage renal disease: Secondary | ICD-10-CM | POA: Diagnosis not present

## 2021-07-17 DIAGNOSIS — D689 Coagulation defect, unspecified: Secondary | ICD-10-CM | POA: Diagnosis not present

## 2021-07-17 DIAGNOSIS — D631 Anemia in chronic kidney disease: Secondary | ICD-10-CM | POA: Diagnosis not present

## 2021-07-19 DIAGNOSIS — N186 End stage renal disease: Secondary | ICD-10-CM | POA: Diagnosis not present

## 2021-07-19 DIAGNOSIS — N2581 Secondary hyperparathyroidism of renal origin: Secondary | ICD-10-CM | POA: Diagnosis not present

## 2021-07-19 DIAGNOSIS — D631 Anemia in chronic kidney disease: Secondary | ICD-10-CM | POA: Diagnosis not present

## 2021-07-19 DIAGNOSIS — D689 Coagulation defect, unspecified: Secondary | ICD-10-CM | POA: Diagnosis not present

## 2021-07-19 DIAGNOSIS — Z992 Dependence on renal dialysis: Secondary | ICD-10-CM | POA: Diagnosis not present

## 2021-07-20 ENCOUNTER — Encounter (HOSPITAL_COMMUNITY): Payer: Medicare Other

## 2021-07-21 DIAGNOSIS — D631 Anemia in chronic kidney disease: Secondary | ICD-10-CM | POA: Diagnosis not present

## 2021-07-21 DIAGNOSIS — Z992 Dependence on renal dialysis: Secondary | ICD-10-CM | POA: Diagnosis not present

## 2021-07-21 DIAGNOSIS — N2581 Secondary hyperparathyroidism of renal origin: Secondary | ICD-10-CM | POA: Diagnosis not present

## 2021-07-21 DIAGNOSIS — D689 Coagulation defect, unspecified: Secondary | ICD-10-CM | POA: Diagnosis not present

## 2021-07-21 DIAGNOSIS — N186 End stage renal disease: Secondary | ICD-10-CM | POA: Diagnosis not present

## 2021-07-23 DIAGNOSIS — N186 End stage renal disease: Secondary | ICD-10-CM | POA: Diagnosis not present

## 2021-07-23 DIAGNOSIS — Z992 Dependence on renal dialysis: Secondary | ICD-10-CM | POA: Diagnosis not present

## 2021-07-23 DIAGNOSIS — T8612 Kidney transplant failure: Secondary | ICD-10-CM | POA: Diagnosis not present

## 2021-07-24 DIAGNOSIS — D689 Coagulation defect, unspecified: Secondary | ICD-10-CM | POA: Diagnosis not present

## 2021-07-24 DIAGNOSIS — N2581 Secondary hyperparathyroidism of renal origin: Secondary | ICD-10-CM | POA: Diagnosis not present

## 2021-07-24 DIAGNOSIS — Z23 Encounter for immunization: Secondary | ICD-10-CM | POA: Diagnosis not present

## 2021-07-24 DIAGNOSIS — N186 End stage renal disease: Secondary | ICD-10-CM | POA: Diagnosis not present

## 2021-07-24 DIAGNOSIS — D631 Anemia in chronic kidney disease: Secondary | ICD-10-CM | POA: Diagnosis not present

## 2021-07-24 DIAGNOSIS — Z992 Dependence on renal dialysis: Secondary | ICD-10-CM | POA: Diagnosis not present

## 2021-07-25 ENCOUNTER — Encounter (HOSPITAL_BASED_OUTPATIENT_CLINIC_OR_DEPARTMENT_OTHER): Payer: Medicare Other | Admitting: Physical Therapy

## 2021-07-26 ENCOUNTER — Encounter (HOSPITAL_COMMUNITY): Payer: Medicare Other

## 2021-07-26 DIAGNOSIS — D689 Coagulation defect, unspecified: Secondary | ICD-10-CM | POA: Diagnosis not present

## 2021-07-26 DIAGNOSIS — Z23 Encounter for immunization: Secondary | ICD-10-CM | POA: Diagnosis not present

## 2021-07-26 DIAGNOSIS — N186 End stage renal disease: Secondary | ICD-10-CM | POA: Diagnosis not present

## 2021-07-26 DIAGNOSIS — Z992 Dependence on renal dialysis: Secondary | ICD-10-CM | POA: Diagnosis not present

## 2021-07-26 DIAGNOSIS — D631 Anemia in chronic kidney disease: Secondary | ICD-10-CM | POA: Diagnosis not present

## 2021-07-26 DIAGNOSIS — N2581 Secondary hyperparathyroidism of renal origin: Secondary | ICD-10-CM | POA: Diagnosis not present

## 2021-07-28 DIAGNOSIS — N186 End stage renal disease: Secondary | ICD-10-CM | POA: Diagnosis not present

## 2021-07-28 DIAGNOSIS — N2581 Secondary hyperparathyroidism of renal origin: Secondary | ICD-10-CM | POA: Diagnosis not present

## 2021-07-28 DIAGNOSIS — D631 Anemia in chronic kidney disease: Secondary | ICD-10-CM | POA: Diagnosis not present

## 2021-07-28 DIAGNOSIS — Z23 Encounter for immunization: Secondary | ICD-10-CM | POA: Diagnosis not present

## 2021-07-28 DIAGNOSIS — D689 Coagulation defect, unspecified: Secondary | ICD-10-CM | POA: Diagnosis not present

## 2021-07-28 DIAGNOSIS — Z992 Dependence on renal dialysis: Secondary | ICD-10-CM | POA: Diagnosis not present

## 2021-07-31 DIAGNOSIS — D689 Coagulation defect, unspecified: Secondary | ICD-10-CM | POA: Diagnosis not present

## 2021-07-31 DIAGNOSIS — Z992 Dependence on renal dialysis: Secondary | ICD-10-CM | POA: Diagnosis not present

## 2021-07-31 DIAGNOSIS — N2581 Secondary hyperparathyroidism of renal origin: Secondary | ICD-10-CM | POA: Diagnosis not present

## 2021-07-31 DIAGNOSIS — D631 Anemia in chronic kidney disease: Secondary | ICD-10-CM | POA: Diagnosis not present

## 2021-07-31 DIAGNOSIS — E039 Hypothyroidism, unspecified: Secondary | ICD-10-CM | POA: Diagnosis not present

## 2021-07-31 DIAGNOSIS — N186 End stage renal disease: Secondary | ICD-10-CM | POA: Diagnosis not present

## 2021-08-02 DIAGNOSIS — N2581 Secondary hyperparathyroidism of renal origin: Secondary | ICD-10-CM | POA: Diagnosis not present

## 2021-08-02 DIAGNOSIS — E039 Hypothyroidism, unspecified: Secondary | ICD-10-CM | POA: Diagnosis not present

## 2021-08-02 DIAGNOSIS — Z992 Dependence on renal dialysis: Secondary | ICD-10-CM | POA: Diagnosis not present

## 2021-08-02 DIAGNOSIS — D689 Coagulation defect, unspecified: Secondary | ICD-10-CM | POA: Diagnosis not present

## 2021-08-02 DIAGNOSIS — N186 End stage renal disease: Secondary | ICD-10-CM | POA: Diagnosis not present

## 2021-08-02 DIAGNOSIS — D631 Anemia in chronic kidney disease: Secondary | ICD-10-CM | POA: Diagnosis not present

## 2021-08-04 DIAGNOSIS — E039 Hypothyroidism, unspecified: Secondary | ICD-10-CM | POA: Diagnosis not present

## 2021-08-04 DIAGNOSIS — N2581 Secondary hyperparathyroidism of renal origin: Secondary | ICD-10-CM | POA: Diagnosis not present

## 2021-08-04 DIAGNOSIS — Z992 Dependence on renal dialysis: Secondary | ICD-10-CM | POA: Diagnosis not present

## 2021-08-04 DIAGNOSIS — D631 Anemia in chronic kidney disease: Secondary | ICD-10-CM | POA: Diagnosis not present

## 2021-08-04 DIAGNOSIS — N186 End stage renal disease: Secondary | ICD-10-CM | POA: Diagnosis not present

## 2021-08-04 DIAGNOSIS — D689 Coagulation defect, unspecified: Secondary | ICD-10-CM | POA: Diagnosis not present

## 2021-08-06 DIAGNOSIS — J209 Acute bronchitis, unspecified: Secondary | ICD-10-CM | POA: Diagnosis not present

## 2021-08-07 ENCOUNTER — Other Ambulatory Visit: Payer: Self-pay

## 2021-08-07 ENCOUNTER — Ambulatory Visit
Admission: RE | Admit: 2021-08-07 | Discharge: 2021-08-07 | Disposition: A | Discharge status: Home / Self Care | Payer: Medicare Other | Source: Ambulatory Visit | Attending: Family Medicine | Admitting: Family Medicine

## 2021-08-07 ENCOUNTER — Other Ambulatory Visit: Payer: Self-pay | Admitting: Family Medicine

## 2021-08-07 DIAGNOSIS — R059 Cough, unspecified: Secondary | ICD-10-CM | POA: Diagnosis not present

## 2021-08-07 DIAGNOSIS — N186 End stage renal disease: Secondary | ICD-10-CM | POA: Diagnosis not present

## 2021-08-07 DIAGNOSIS — J209 Acute bronchitis, unspecified: Secondary | ICD-10-CM

## 2021-08-07 DIAGNOSIS — D689 Coagulation defect, unspecified: Secondary | ICD-10-CM | POA: Diagnosis not present

## 2021-08-07 DIAGNOSIS — R0602 Shortness of breath: Secondary | ICD-10-CM | POA: Diagnosis not present

## 2021-08-07 DIAGNOSIS — Z992 Dependence on renal dialysis: Secondary | ICD-10-CM | POA: Diagnosis not present

## 2021-08-07 DIAGNOSIS — D631 Anemia in chronic kidney disease: Secondary | ICD-10-CM | POA: Diagnosis not present

## 2021-08-07 DIAGNOSIS — J449 Chronic obstructive pulmonary disease, unspecified: Secondary | ICD-10-CM | POA: Diagnosis not present

## 2021-08-07 DIAGNOSIS — N2581 Secondary hyperparathyroidism of renal origin: Secondary | ICD-10-CM | POA: Diagnosis not present

## 2021-08-11 DIAGNOSIS — D689 Coagulation defect, unspecified: Secondary | ICD-10-CM | POA: Diagnosis not present

## 2021-08-11 DIAGNOSIS — D631 Anemia in chronic kidney disease: Secondary | ICD-10-CM | POA: Diagnosis not present

## 2021-08-11 DIAGNOSIS — Z992 Dependence on renal dialysis: Secondary | ICD-10-CM | POA: Diagnosis not present

## 2021-08-11 DIAGNOSIS — N186 End stage renal disease: Secondary | ICD-10-CM | POA: Diagnosis not present

## 2021-08-11 DIAGNOSIS — N2581 Secondary hyperparathyroidism of renal origin: Secondary | ICD-10-CM | POA: Diagnosis not present

## 2021-08-14 DIAGNOSIS — N2581 Secondary hyperparathyroidism of renal origin: Secondary | ICD-10-CM | POA: Diagnosis not present

## 2021-08-14 DIAGNOSIS — R52 Pain, unspecified: Secondary | ICD-10-CM | POA: Diagnosis not present

## 2021-08-14 DIAGNOSIS — D631 Anemia in chronic kidney disease: Secondary | ICD-10-CM | POA: Diagnosis not present

## 2021-08-14 DIAGNOSIS — D689 Coagulation defect, unspecified: Secondary | ICD-10-CM | POA: Diagnosis not present

## 2021-08-14 DIAGNOSIS — Z992 Dependence on renal dialysis: Secondary | ICD-10-CM | POA: Diagnosis not present

## 2021-08-14 DIAGNOSIS — N186 End stage renal disease: Secondary | ICD-10-CM | POA: Diagnosis not present

## 2021-08-17 DIAGNOSIS — N186 End stage renal disease: Secondary | ICD-10-CM | POA: Diagnosis not present

## 2021-08-17 DIAGNOSIS — R52 Pain, unspecified: Secondary | ICD-10-CM | POA: Diagnosis not present

## 2021-08-17 DIAGNOSIS — D631 Anemia in chronic kidney disease: Secondary | ICD-10-CM | POA: Diagnosis not present

## 2021-08-17 DIAGNOSIS — N2581 Secondary hyperparathyroidism of renal origin: Secondary | ICD-10-CM | POA: Diagnosis not present

## 2021-08-17 DIAGNOSIS — D689 Coagulation defect, unspecified: Secondary | ICD-10-CM | POA: Diagnosis not present

## 2021-08-17 DIAGNOSIS — Z992 Dependence on renal dialysis: Secondary | ICD-10-CM | POA: Diagnosis not present

## 2021-08-21 DIAGNOSIS — D689 Coagulation defect, unspecified: Secondary | ICD-10-CM | POA: Diagnosis not present

## 2021-08-21 DIAGNOSIS — D631 Anemia in chronic kidney disease: Secondary | ICD-10-CM | POA: Diagnosis not present

## 2021-08-21 DIAGNOSIS — N186 End stage renal disease: Secondary | ICD-10-CM | POA: Diagnosis not present

## 2021-08-21 DIAGNOSIS — N2581 Secondary hyperparathyroidism of renal origin: Secondary | ICD-10-CM | POA: Diagnosis not present

## 2021-08-21 DIAGNOSIS — Z992 Dependence on renal dialysis: Secondary | ICD-10-CM | POA: Diagnosis not present

## 2021-08-22 DIAGNOSIS — T8612 Kidney transplant failure: Secondary | ICD-10-CM | POA: Diagnosis not present

## 2021-08-22 DIAGNOSIS — N186 End stage renal disease: Secondary | ICD-10-CM | POA: Diagnosis not present

## 2021-08-22 DIAGNOSIS — Z992 Dependence on renal dialysis: Secondary | ICD-10-CM | POA: Diagnosis not present

## 2021-08-23 DIAGNOSIS — A499 Bacterial infection, unspecified: Secondary | ICD-10-CM | POA: Diagnosis not present

## 2021-08-23 DIAGNOSIS — D689 Coagulation defect, unspecified: Secondary | ICD-10-CM | POA: Diagnosis not present

## 2021-08-23 DIAGNOSIS — N2581 Secondary hyperparathyroidism of renal origin: Secondary | ICD-10-CM | POA: Diagnosis not present

## 2021-08-23 DIAGNOSIS — Z992 Dependence on renal dialysis: Secondary | ICD-10-CM | POA: Diagnosis not present

## 2021-08-23 DIAGNOSIS — N186 End stage renal disease: Secondary | ICD-10-CM | POA: Diagnosis not present

## 2021-08-23 DIAGNOSIS — D631 Anemia in chronic kidney disease: Secondary | ICD-10-CM | POA: Diagnosis not present

## 2021-08-23 DIAGNOSIS — E039 Hypothyroidism, unspecified: Secondary | ICD-10-CM | POA: Diagnosis not present

## 2021-08-25 DIAGNOSIS — D631 Anemia in chronic kidney disease: Secondary | ICD-10-CM | POA: Diagnosis not present

## 2021-08-25 DIAGNOSIS — N186 End stage renal disease: Secondary | ICD-10-CM | POA: Diagnosis not present

## 2021-08-25 DIAGNOSIS — N2581 Secondary hyperparathyroidism of renal origin: Secondary | ICD-10-CM | POA: Diagnosis not present

## 2021-08-25 DIAGNOSIS — E039 Hypothyroidism, unspecified: Secondary | ICD-10-CM | POA: Diagnosis not present

## 2021-08-25 DIAGNOSIS — A499 Bacterial infection, unspecified: Secondary | ICD-10-CM | POA: Diagnosis not present

## 2021-08-25 DIAGNOSIS — D689 Coagulation defect, unspecified: Secondary | ICD-10-CM | POA: Diagnosis not present

## 2021-08-25 DIAGNOSIS — Z992 Dependence on renal dialysis: Secondary | ICD-10-CM | POA: Diagnosis not present

## 2021-08-28 DIAGNOSIS — Z992 Dependence on renal dialysis: Secondary | ICD-10-CM | POA: Diagnosis not present

## 2021-08-28 DIAGNOSIS — A499 Bacterial infection, unspecified: Secondary | ICD-10-CM | POA: Diagnosis not present

## 2021-08-28 DIAGNOSIS — N2581 Secondary hyperparathyroidism of renal origin: Secondary | ICD-10-CM | POA: Diagnosis not present

## 2021-08-28 DIAGNOSIS — D689 Coagulation defect, unspecified: Secondary | ICD-10-CM | POA: Diagnosis not present

## 2021-08-28 DIAGNOSIS — E039 Hypothyroidism, unspecified: Secondary | ICD-10-CM | POA: Diagnosis not present

## 2021-08-28 DIAGNOSIS — N186 End stage renal disease: Secondary | ICD-10-CM | POA: Diagnosis not present

## 2021-08-28 DIAGNOSIS — D631 Anemia in chronic kidney disease: Secondary | ICD-10-CM | POA: Diagnosis not present

## 2021-08-30 DIAGNOSIS — D689 Coagulation defect, unspecified: Secondary | ICD-10-CM | POA: Diagnosis not present

## 2021-08-30 DIAGNOSIS — N186 End stage renal disease: Secondary | ICD-10-CM | POA: Diagnosis not present

## 2021-08-30 DIAGNOSIS — N2581 Secondary hyperparathyroidism of renal origin: Secondary | ICD-10-CM | POA: Diagnosis not present

## 2021-08-30 DIAGNOSIS — A499 Bacterial infection, unspecified: Secondary | ICD-10-CM | POA: Diagnosis not present

## 2021-08-30 DIAGNOSIS — Z992 Dependence on renal dialysis: Secondary | ICD-10-CM | POA: Diagnosis not present

## 2021-08-30 DIAGNOSIS — D631 Anemia in chronic kidney disease: Secondary | ICD-10-CM | POA: Diagnosis not present

## 2021-08-30 DIAGNOSIS — E039 Hypothyroidism, unspecified: Secondary | ICD-10-CM | POA: Diagnosis not present

## 2021-09-01 DIAGNOSIS — Z992 Dependence on renal dialysis: Secondary | ICD-10-CM | POA: Diagnosis not present

## 2021-09-01 DIAGNOSIS — A499 Bacterial infection, unspecified: Secondary | ICD-10-CM | POA: Diagnosis not present

## 2021-09-01 DIAGNOSIS — N186 End stage renal disease: Secondary | ICD-10-CM | POA: Diagnosis not present

## 2021-09-01 DIAGNOSIS — D689 Coagulation defect, unspecified: Secondary | ICD-10-CM | POA: Diagnosis not present

## 2021-09-01 DIAGNOSIS — D631 Anemia in chronic kidney disease: Secondary | ICD-10-CM | POA: Diagnosis not present

## 2021-09-01 DIAGNOSIS — N2581 Secondary hyperparathyroidism of renal origin: Secondary | ICD-10-CM | POA: Diagnosis not present

## 2021-09-01 DIAGNOSIS — E039 Hypothyroidism, unspecified: Secondary | ICD-10-CM | POA: Diagnosis not present

## 2021-09-04 DIAGNOSIS — Z992 Dependence on renal dialysis: Secondary | ICD-10-CM | POA: Diagnosis not present

## 2021-09-04 DIAGNOSIS — E039 Hypothyroidism, unspecified: Secondary | ICD-10-CM | POA: Diagnosis not present

## 2021-09-04 DIAGNOSIS — D631 Anemia in chronic kidney disease: Secondary | ICD-10-CM | POA: Diagnosis not present

## 2021-09-04 DIAGNOSIS — N186 End stage renal disease: Secondary | ICD-10-CM | POA: Diagnosis not present

## 2021-09-04 DIAGNOSIS — D689 Coagulation defect, unspecified: Secondary | ICD-10-CM | POA: Diagnosis not present

## 2021-09-04 DIAGNOSIS — A499 Bacterial infection, unspecified: Secondary | ICD-10-CM | POA: Diagnosis not present

## 2021-09-04 DIAGNOSIS — N2581 Secondary hyperparathyroidism of renal origin: Secondary | ICD-10-CM | POA: Diagnosis not present

## 2021-09-06 DIAGNOSIS — N186 End stage renal disease: Secondary | ICD-10-CM | POA: Diagnosis not present

## 2021-09-06 DIAGNOSIS — Z992 Dependence on renal dialysis: Secondary | ICD-10-CM | POA: Diagnosis not present

## 2021-09-06 DIAGNOSIS — N2581 Secondary hyperparathyroidism of renal origin: Secondary | ICD-10-CM | POA: Diagnosis not present

## 2021-09-06 DIAGNOSIS — D689 Coagulation defect, unspecified: Secondary | ICD-10-CM | POA: Diagnosis not present

## 2021-09-06 DIAGNOSIS — E039 Hypothyroidism, unspecified: Secondary | ICD-10-CM | POA: Diagnosis not present

## 2021-09-06 DIAGNOSIS — D631 Anemia in chronic kidney disease: Secondary | ICD-10-CM | POA: Diagnosis not present

## 2021-09-06 DIAGNOSIS — A499 Bacterial infection, unspecified: Secondary | ICD-10-CM | POA: Diagnosis not present

## 2021-09-06 NOTE — Progress Notes (Addendum)
POST OPERATIVE OFFICE NOTE    CC:  F/u for surgery  HPI:  This is a 65 y.o. female who is s/p  1) reexposures of left upper extremity arteriovenous graft venous anastomosis 2) arteriovenous graft thrombectomy 3) arteriovenous graft venous anastomosis patch angioplasty (bovine pericardium)  on 06/29/2021 by Dr. Stanford Breed. She had attempted percutaneous thrombectomy on 06/28/2021 at [facility].  The size match between the venous anastomosis and the native axillary vein was felt to be too large, and the patient referred for open thrombectomy and venous anastomotic revision.    Her original LUA AVG was placed 05/20/2018 by Dr. Donzetta Matters.    She was last seen on 07/16/2021 and she was doing well without steal sx.  Her graft was working well. She did have a moderate hematoma in the left axilla and we did not consider surgical drainage of this.    Pt comes in today for evaluation of some drainage and area that is yellow appearing on her graft.  She states that they are not sticking her in this area.  It has been draining since surgery.  She states she has had some off and on fevers her whole life but nothing new. She states the graft is working well without difficulty.   She has had a round of antibiotics.   Pt states she does not have pain/numbness in left hand.    The pt is on dialysis T/T/S at [facility] location.   Allergies  Allergen Reactions   Mircera [Methoxy Polyethylene Glycol-Epoetin Beta] Other (See Comments)    Severe joint and muscle pain   Dapsone Rash and Other (See Comments)    Pain in feet   Pregabalin Rash and Other (See Comments)    Fever - reaction to Lyrica   Sulfonamide Derivatives Rash   Tramadol Rash and Other (See Comments)    Rash on cheek    Current Outpatient Medications  Medication Sig Dispense Refill   albuterol (VENTOLIN HFA) 108 (90 Base) MCG/ACT inhaler Inhale 2 puffs into the lungs daily as needed (Asthma).     amLODipine (NORVASC) 5 MG tablet Take 10 mg by  mouth at bedtime.     aspirin EC 325 MG tablet Take 1 tablet (325 mg total) by mouth 2 (two) times a day. (Patient not taking: No sig reported) 30 tablet 0   budesonide-formoterol (SYMBICORT) 160-4.5 MCG/ACT inhaler Inhale 2 puffs into the lungs 2 (two) times daily.     calcium acetate (PHOSLO) 667 MG capsule Take 667-1,334 mg by mouth 3 (three) times daily with meals.     cinacalcet (SENSIPAR) 30 MG tablet Take 30 mg by mouth daily after supper.     EPOETIN ALFA IJ Inject into the skin See admin instructions. Inject  subcutaneously at dialysis as needed for low hemoglobin     Garlic 5364 MG CAPS Take 1,000 mg by mouth in the morning, at noon, in the evening, and at bedtime.     hydroxychloroquine (PLAQUENIL) 200 MG tablet Take 200 mg by mouth daily.     levothyroxine (SYNTHROID, LEVOTHROID) 50 MCG tablet Take 50-75 mcg by mouth See admin instructions. Take  1 1/2 tablets (75 mcg) by mouth daily on Tuesday and Thursday nights, take 1 tablet (50 mcg) by mouth daily on all other nights of the week     losartan (COZAAR) 100 MG tablet Take 100 mg by mouth daily.     Multiple Vitamins-Minerals (ZINC PO) Take 22 mg by mouth daily.  OVER THE COUNTER MEDICATION Take 4 capsules by mouth 2 (two) times a day. Juice plus - 1 fruit, 1 vegetable, 1 berries, 1 omega - twice daily     oxyCODONE-acetaminophen (PERCOCET/ROXICET) 5-325 MG tablet Take 1 tablet by mouth every 6 (six) hours as needed. 15 tablet 0   predniSONE (DELTASONE) 5 MG tablet Take 5 mg by mouth daily with breakfast.     Probiotic Product (PROBIOTIC DAILY PO) Take 1 tablet by mouth daily.     PSYLLIUM PO Take 3 capsules by mouth daily at 2 PM.      tiotropium (SPIRIVA) 18 MCG inhalation capsule Place 1 puff into inhaler and inhale daily.     traZODone (DESYREL) 50 MG tablet Take 50 mg by mouth at bedtime.     No current facility-administered medications for this visit.     ROS:  See HPI  Physical Exam:  Today's Vitals   09/07/21 1025   BP: (!) 154/83  Pulse: 81  Resp: 20  Temp: 97.6 F (36.4 C)  TempSrc: Temporal  SpO2: 94%  Weight: 112 lb 6.4 oz (51 kg)  Height: 5\' 5"  (1.651 m)   Body mass index is 18.7 kg/m.    Extremities:   There is a palpable left radial pulse.   Motor and sensory are in tact.   There is a thrill present.  The graft is easily palpable     Assessment/Plan:  This is a 65 y.o. female who is s/p: 1) reexposures of left upper extremity arteriovenous graft venous anastomosis 2) arteriovenous graft thrombectomy 3) arteriovenous graft venous anastomosis patch angioplasty (bovine pericardium)  on 06/29/2021 by Dr. Stanford Breed. She had attempted percutaneous thrombectomy on 06/28/2021 at [facility].  The size match between the venous anastomosis and the native axillary vein was felt to be too large, and the patient referred for open thrombectomy and venous anastomotic revision.    Her original LUA AVG was placed 05/20/2018 by Dr. Donzetta Matters.     -the pt does not have evidence of steal and the graft is working well.  She was sent over for evaluation of drainage and cystic appearing area just below the draining area. -pt seen with Dr. Virl Cagey and he was unable to express any purulent material out only a small amount of bloody drainage.  He feels the cystic area is either just a cyst or a vein and not worrisome for infection.  -he discussed with her that if anything changes or this worsens, we can plan for OR exploration of this area but would not do that currently.  Pt is in agreement.  She is encouraged to call us if anything changes as we would be glad to see her back.  -the pt will follow up as needed.    Leontine Locket, [facility] [phone]  Clinic MD:  pt seen with Dr. Virl Cagey

## 2021-09-07 ENCOUNTER — Ambulatory Visit (INDEPENDENT_AMBULATORY_CARE_PROVIDER_SITE_OTHER): Payer: Medicare Other | Admitting: Physician Assistant

## 2021-09-07 ENCOUNTER — Encounter: Payer: Self-pay | Admitting: Physician Assistant

## 2021-09-07 ENCOUNTER — Other Ambulatory Visit: Payer: Self-pay

## 2021-09-07 VITALS — BP 154/83 | HR 81 | Temp 97.6°F | Resp 20 | Ht 65.0 in | Wt 112.4 lb

## 2021-09-07 DIAGNOSIS — M810 Age-related osteoporosis without current pathological fracture: Secondary | ICD-10-CM | POA: Insufficient documentation

## 2021-09-07 DIAGNOSIS — E785 Hyperlipidemia, unspecified: Secondary | ICD-10-CM | POA: Insufficient documentation

## 2021-09-07 DIAGNOSIS — Z992 Dependence on renal dialysis: Secondary | ICD-10-CM

## 2021-09-07 DIAGNOSIS — J329 Chronic sinusitis, unspecified: Secondary | ICD-10-CM | POA: Insufficient documentation

## 2021-09-07 DIAGNOSIS — N186 End stage renal disease: Secondary | ICD-10-CM

## 2021-09-07 DIAGNOSIS — G2581 Restless legs syndrome: Secondary | ICD-10-CM | POA: Insufficient documentation

## 2021-09-07 DIAGNOSIS — M069 Rheumatoid arthritis, unspecified: Secondary | ICD-10-CM | POA: Insufficient documentation

## 2021-09-07 DIAGNOSIS — J310 Chronic rhinitis: Secondary | ICD-10-CM | POA: Insufficient documentation

## 2021-09-07 DIAGNOSIS — E213 Hyperparathyroidism, unspecified: Secondary | ICD-10-CM | POA: Insufficient documentation

## 2021-09-08 DIAGNOSIS — D689 Coagulation defect, unspecified: Secondary | ICD-10-CM | POA: Diagnosis not present

## 2021-09-08 DIAGNOSIS — Z992 Dependence on renal dialysis: Secondary | ICD-10-CM | POA: Diagnosis not present

## 2021-09-08 DIAGNOSIS — A499 Bacterial infection, unspecified: Secondary | ICD-10-CM | POA: Diagnosis not present

## 2021-09-08 DIAGNOSIS — N2581 Secondary hyperparathyroidism of renal origin: Secondary | ICD-10-CM | POA: Diagnosis not present

## 2021-09-08 DIAGNOSIS — N186 End stage renal disease: Secondary | ICD-10-CM | POA: Diagnosis not present

## 2021-09-08 DIAGNOSIS — E039 Hypothyroidism, unspecified: Secondary | ICD-10-CM | POA: Diagnosis not present

## 2021-09-08 DIAGNOSIS — D631 Anemia in chronic kidney disease: Secondary | ICD-10-CM | POA: Diagnosis not present

## 2021-09-11 DIAGNOSIS — D631 Anemia in chronic kidney disease: Secondary | ICD-10-CM | POA: Diagnosis not present

## 2021-09-11 DIAGNOSIS — D689 Coagulation defect, unspecified: Secondary | ICD-10-CM | POA: Diagnosis not present

## 2021-09-11 DIAGNOSIS — E039 Hypothyroidism, unspecified: Secondary | ICD-10-CM | POA: Diagnosis not present

## 2021-09-11 DIAGNOSIS — N186 End stage renal disease: Secondary | ICD-10-CM | POA: Diagnosis not present

## 2021-09-11 DIAGNOSIS — A499 Bacterial infection, unspecified: Secondary | ICD-10-CM | POA: Diagnosis not present

## 2021-09-11 DIAGNOSIS — Z992 Dependence on renal dialysis: Secondary | ICD-10-CM | POA: Diagnosis not present

## 2021-09-11 DIAGNOSIS — N2581 Secondary hyperparathyroidism of renal origin: Secondary | ICD-10-CM | POA: Diagnosis not present

## 2021-09-13 DIAGNOSIS — N186 End stage renal disease: Secondary | ICD-10-CM | POA: Diagnosis not present

## 2021-09-13 DIAGNOSIS — Z992 Dependence on renal dialysis: Secondary | ICD-10-CM | POA: Diagnosis not present

## 2021-09-13 DIAGNOSIS — N2581 Secondary hyperparathyroidism of renal origin: Secondary | ICD-10-CM | POA: Diagnosis not present

## 2021-09-13 DIAGNOSIS — E039 Hypothyroidism, unspecified: Secondary | ICD-10-CM | POA: Diagnosis not present

## 2021-09-13 DIAGNOSIS — D689 Coagulation defect, unspecified: Secondary | ICD-10-CM | POA: Diagnosis not present

## 2021-09-13 DIAGNOSIS — D631 Anemia in chronic kidney disease: Secondary | ICD-10-CM | POA: Diagnosis not present

## 2021-09-13 DIAGNOSIS — A499 Bacterial infection, unspecified: Secondary | ICD-10-CM | POA: Diagnosis not present

## 2021-09-15 DIAGNOSIS — D689 Coagulation defect, unspecified: Secondary | ICD-10-CM | POA: Diagnosis not present

## 2021-09-15 DIAGNOSIS — E039 Hypothyroidism, unspecified: Secondary | ICD-10-CM | POA: Diagnosis not present

## 2021-09-15 DIAGNOSIS — D631 Anemia in chronic kidney disease: Secondary | ICD-10-CM | POA: Diagnosis not present

## 2021-09-15 DIAGNOSIS — N186 End stage renal disease: Secondary | ICD-10-CM | POA: Diagnosis not present

## 2021-09-15 DIAGNOSIS — Z992 Dependence on renal dialysis: Secondary | ICD-10-CM | POA: Diagnosis not present

## 2021-09-15 DIAGNOSIS — N2581 Secondary hyperparathyroidism of renal origin: Secondary | ICD-10-CM | POA: Diagnosis not present

## 2021-09-15 DIAGNOSIS — A499 Bacterial infection, unspecified: Secondary | ICD-10-CM | POA: Diagnosis not present

## 2021-09-18 DIAGNOSIS — D631 Anemia in chronic kidney disease: Secondary | ICD-10-CM | POA: Diagnosis not present

## 2021-09-18 DIAGNOSIS — E039 Hypothyroidism, unspecified: Secondary | ICD-10-CM | POA: Diagnosis not present

## 2021-09-18 DIAGNOSIS — N2581 Secondary hyperparathyroidism of renal origin: Secondary | ICD-10-CM | POA: Diagnosis not present

## 2021-09-18 DIAGNOSIS — D689 Coagulation defect, unspecified: Secondary | ICD-10-CM | POA: Diagnosis not present

## 2021-09-18 DIAGNOSIS — A499 Bacterial infection, unspecified: Secondary | ICD-10-CM | POA: Diagnosis not present

## 2021-09-18 DIAGNOSIS — Z992 Dependence on renal dialysis: Secondary | ICD-10-CM | POA: Diagnosis not present

## 2021-09-18 DIAGNOSIS — N186 End stage renal disease: Secondary | ICD-10-CM | POA: Diagnosis not present

## 2021-09-20 DIAGNOSIS — E039 Hypothyroidism, unspecified: Secondary | ICD-10-CM | POA: Diagnosis not present

## 2021-09-20 DIAGNOSIS — A499 Bacterial infection, unspecified: Secondary | ICD-10-CM | POA: Diagnosis not present

## 2021-09-20 DIAGNOSIS — N186 End stage renal disease: Secondary | ICD-10-CM | POA: Diagnosis not present

## 2021-09-20 DIAGNOSIS — D689 Coagulation defect, unspecified: Secondary | ICD-10-CM | POA: Diagnosis not present

## 2021-09-20 DIAGNOSIS — D631 Anemia in chronic kidney disease: Secondary | ICD-10-CM | POA: Diagnosis not present

## 2021-09-20 DIAGNOSIS — N2581 Secondary hyperparathyroidism of renal origin: Secondary | ICD-10-CM | POA: Diagnosis not present

## 2021-09-20 DIAGNOSIS — Z992 Dependence on renal dialysis: Secondary | ICD-10-CM | POA: Diagnosis not present

## 2021-09-21 ENCOUNTER — Encounter (HOSPITAL_BASED_OUTPATIENT_CLINIC_OR_DEPARTMENT_OTHER): Payer: Self-pay | Admitting: Emergency Medicine

## 2021-09-21 ENCOUNTER — Other Ambulatory Visit: Payer: Self-pay

## 2021-09-21 DIAGNOSIS — T82590A Other mechanical complication of surgically created arteriovenous fistula, initial encounter: Secondary | ICD-10-CM | POA: Insufficient documentation

## 2021-09-21 DIAGNOSIS — E039 Hypothyroidism, unspecified: Secondary | ICD-10-CM | POA: Insufficient documentation

## 2021-09-21 DIAGNOSIS — Z79899 Other long term (current) drug therapy: Secondary | ICD-10-CM | POA: Diagnosis not present

## 2021-09-21 DIAGNOSIS — I120 Hypertensive chronic kidney disease with stage 5 chronic kidney disease or end stage renal disease: Secondary | ICD-10-CM | POA: Diagnosis not present

## 2021-09-21 DIAGNOSIS — N186 End stage renal disease: Secondary | ICD-10-CM | POA: Insufficient documentation

## 2021-09-21 DIAGNOSIS — J45909 Unspecified asthma, uncomplicated: Secondary | ICD-10-CM | POA: Diagnosis not present

## 2021-09-21 DIAGNOSIS — I10 Essential (primary) hypertension: Secondary | ICD-10-CM | POA: Diagnosis not present

## 2021-09-21 DIAGNOSIS — T82838A Hemorrhage of vascular prosthetic devices, implants and grafts, initial encounter: Secondary | ICD-10-CM | POA: Diagnosis not present

## 2021-09-21 NOTE — ED Triage Notes (Signed)
Pt arrives pov with c/o graft bleeding today at 1600. Pressure dressing applied in triage. Pt reports dialysis yesterday, scheduled again tomorrow

## 2021-09-22 ENCOUNTER — Emergency Department (HOSPITAL_BASED_OUTPATIENT_CLINIC_OR_DEPARTMENT_OTHER)
Admission: EM | Admit: 2021-09-22 | Discharge: 2021-09-22 | Disposition: A | Discharge status: Home / Self Care | Payer: Medicare Other | Attending: Emergency Medicine | Admitting: Emergency Medicine

## 2021-09-22 DIAGNOSIS — N186 End stage renal disease: Secondary | ICD-10-CM | POA: Diagnosis not present

## 2021-09-22 DIAGNOSIS — T82590A Other mechanical complication of surgically created arteriovenous fistula, initial encounter: Secondary | ICD-10-CM

## 2021-09-22 DIAGNOSIS — Z992 Dependence on renal dialysis: Secondary | ICD-10-CM | POA: Diagnosis not present

## 2021-09-22 DIAGNOSIS — I159 Secondary hypertension, unspecified: Secondary | ICD-10-CM

## 2021-09-22 DIAGNOSIS — T8612 Kidney transplant failure: Secondary | ICD-10-CM | POA: Diagnosis not present

## 2021-09-22 LAB — COMPREHENSIVE METABOLIC PANEL
ALT: 7 U/L (ref 0–44)
AST: 9 U/L — ABNORMAL LOW (ref 15–41)
Albumin: 3.8 g/dL (ref 3.5–5.0)
Alkaline Phosphatase: 90 U/L (ref 38–126)
Anion gap: 12 (ref 5–15)
BUN: 46 mg/dL — ABNORMAL HIGH (ref 8–23)
CO2: 31 mmol/L (ref 22–32)
Calcium: 9.6 mg/dL (ref 8.9–10.3)
Chloride: 95 mmol/L — ABNORMAL LOW (ref 98–111)
Creatinine, Ser: 6.77 mg/dL — ABNORMAL HIGH (ref 0.44–1.00)
GFR, Estimated: 6 mL/min — ABNORMAL LOW (ref >60)
Glucose, Bld: 89 mg/dL (ref 70–99)
Potassium: 3.8 mmol/L (ref 3.5–5.1)
Sodium: 138 mmol/L (ref 135–145)
Total Bilirubin: 0.4 mg/dL (ref 0.3–1.2)
Total Protein: 6.8 g/dL (ref 6.5–8.1)

## 2021-09-22 LAB — CBC WITH DIFFERENTIAL/PLATELET
Abs Immature Granulocytes: 0.02 10*3/uL (ref 0.00–0.07)
Basophils Absolute: 0 10*3/uL (ref 0.0–0.1)
Basophils Relative: 1 %
Eosinophils Absolute: 0.2 10*3/uL (ref 0.0–0.5)
Eosinophils Relative: 3 %
HCT: 35.7 % — ABNORMAL LOW (ref 36.0–46.0)
Hemoglobin: 11.1 g/dL — ABNORMAL LOW (ref 12.0–15.0)
Immature Granulocytes: 0 %
Lymphocytes Relative: 20 %
Lymphs Abs: 1.2 10*3/uL (ref 0.7–4.0)
MCH: 29.8 pg (ref 26.0–34.0)
MCHC: 31.1 g/dL (ref 30.0–36.0)
MCV: 95.7 fL (ref 80.0–100.0)
Monocytes Absolute: 0.6 10*3/uL (ref 0.1–1.0)
Monocytes Relative: 10 %
Neutro Abs: 3.9 10*3/uL (ref 1.7–7.7)
Neutrophils Relative %: 66 %
Platelets: 293 10*3/uL (ref 150–400)
RBC: 3.73 MIL/uL — ABNORMAL LOW (ref 3.87–5.11)
RDW: 14.2 % (ref 11.5–15.5)
WBC: 5.9 10*3/uL (ref 4.0–10.5)
nRBC: 0 % (ref 0.0–0.2)

## 2021-09-22 MED ORDER — AMLODIPINE BESYLATE 5 MG PO TABS
5.0000 mg | ORAL_TABLET | Freq: Once | ORAL | Status: DC
  Filled 2021-09-22: qty 1

## 2021-09-22 NOTE — ED Notes (Signed)
Graft bleeding in lobby, dripping from dressing. Dressing replaced and bleeding controlled. Alert, oriented x4, not dizzy at this time.

## 2021-09-22 NOTE — ED Provider Notes (Signed)
[facility] EMERGENCY DEPT Provider Note   CSN: [id number] Arrival date & time: 09/21/21  2018     History Chief Complaint  Patient presents with   Graft Bleeding    Kimberly [name] is a 65 y.o. female.  65 year old female with medical problems documented below the presents to the emergency department today with bleeding from her graft.  She states that she had a graft placed 2019.  She had a surgery on back in October:   From recent vascular surgery note 1) reexposures of left upper extremity arteriovenous graft venous anastomosis 2) arteriovenous graft thrombectomy 3) arteriovenous graft venous anastomosis patch angioplasty (bovine pericardium)  on 06/29/2021 by Dr. Stanford Breed. She had attempted percutaneous thrombectomy on 06/28/2021 at [facility].  The size match between the venous anastomosis and the native axillary vein was felt to be too large, and the patient referred for open thrombectomy and venous anastomotic revision.    She stated she had had some oozing from the proximal graft scar daily since that time but not even much to the [place] Band-Aid.  But tonight she was arrested and she noticed that her arm felt "warm so she looked and she had blood all soaking her sleeve.  She wrapped it up and it did okay for about 3 to 4 hours but then started bleeding again so she presented here for evaluation.  She does not feel lightheaded, dizzy, weak or faint.  She states that the graft was wrapped again outside in the waiting room but then the blood to that as well and I just rewrapped it about 40 minutes ago and that seems to have improved bleeding at this time.  She still feels asymptomatic otherwise.       Past Medical History:  Diagnosis Date   Anemia    Arthritis    Asthma    Complication of anesthesia    Slow to awaken and AMS- took 1 month to memory and recall to return   COVID    Diverticulosis    Dyspnea    with exertion. when walking up stairs or  walking a distance   First degree AV block    GERD (gastroesophageal reflux disease)    Heart murmur    child   History of blood transfusion    History of chronic cough    History of hiatal hernia    History of hoarseness    Hypertension    history; resolved on dialysis   Hypothyroidism    IgA nephropathy    ESRD - Hemo TTH Sat   Pneumonia    Premature atrial beat    Premature ventricular beat    Renal transplant failure and rejection 2012   Shingles 03/2011   Sleep apnea    uses a mouth guard   Stroke [facility]) October 2010, March 2013   cerebral aneurysm, 07/2017    Patient Active Problem List   Diagnosis Date Noted   Elevated fasting lipid profile 09/07/2021   Hyperparathyroidism ([place]) 09/07/2021   Osteoporosis 09/07/2021   Restless legs syndrome 09/07/2021   Rheumatoid arthritis ([place]) 09/07/2021   Rhinosinusitis 09/07/2021   Referred ear pain, left 08/28/2020   Displaced fracture of left femoral neck ([place]) 02/06/2019   Closed left hip fracture, initial encounter ([place]) 02/05/2019   History of stroke 02/05/2019   Benign carcinoid tumor of lung 07/10/2018   IgA nephropathy with nephrotic syndrome 10/30/2017   Snoring 10/30/2017   Excessive daytime sleepiness 10/30/2017   Complaint related to dreams  27/74/1287   Acute embolic stroke ([place]) 86/76/7209   Pre-transplant evaluation for kidney transplant 05/08/2017   Laryngopharyngeal reflux (LPR) 05/29/2016   Cough, persistent 05/29/2016   Chronic cough 09/15/2015   Paroxysmal atrial fibrillation ([place]) 08/21/2015   Acute pericarditis 08/05/2015   Elevated troponin 08/05/2015   Pain in the chest    Atypical chest pain 08/04/2015   LUL cavitary lesion post VATS 2015-cx neg 09/01/2014   Pulmonary infiltrates 07/05/2014   Gross hematuria 06/30/2014   Kidney transplant failure 06/28/2014   Renal failure 06/28/2014   Peritoneal dialysis catheter in place March 2015 01/14/2014   Ventral hernia s/p lap repair w mesh March  2015 01/14/2014   Secondary hyperparathyroidism (of renal origin) 04/20/2013   ESRD (end stage renal disease) ([place]) 10/28/2012   Diarrhea, unspecified 08/10/2012   Urinary tract infection 08/10/2012   Reactive airway disease 03/13/2012   Pulmonary nodule 03/13/2012   Stroke/cerebrovascular accident ([place]) 03/13/2012   Vitamin D deficiency, unspecified 03/13/2012   PVC's (premature ventricular contractions) 01/17/2012   Stroke ([place]) 11/29/2011   DM type 2 causing complication ([place]) 47/05/6282   S/P kidney transplant    IgA nephropathy    GERD (gastroesophageal reflux disease) 12/07/2007   Cough variant asthma vs uacs vs components of both  11/09/2007   Hypothyroidism, unspecified 10/12/2007   ANEMIA 10/12/2007   HTN (hypertension) 10/12/2007   CARDIAC ARRHYTHMIA 10/12/2007   Asthma 10/12/2007   CKD (chronic kidney disease) stage V requiring chronic dialysis ([place]) 10/12/2007    Past Surgical History:  Procedure Laterality Date   AV FISTULA PLACEMENT Left 05/20/2018   Procedure: INSERTION OF ARTERIOVENOUS (AV) GORE-TEX GRAFT LEFT  ARM;  Surgeon: [name], MD;  Location: [facility];  Service: Vascular;  Laterality: Left;   CAPD INSERTION  08/25/2008   open, Dr [name]   CAPD INSERTION N/A 12/14/2013   Procedure: LAPAROSCOPIC INSERTION CONTINUOUS AMBULATORY PERITONEAL DIALYSIS  (CAPD) CATHETER;  Surgeon: [name], MD;  Location: [facility];  Service: General;  Laterality: N/A;   CAPD REMOVAL  03/22/2010   Dr [name]   COLONOSCOPY     ESOPHAGOGASTRODUODENOSCOPY     EYE SURGERY Bilateral    radial keratotomy   Hemodialysis catheter Right 02/13/2018   INSERTION OF MESH N/A 12/14/2013   Procedure: INSERTION OF MESH;  Surgeon: [name], MD;  Location: [facility];  Service: General;  Laterality: N/A;   KIDNEY TRANSPLANT Right 01/2010   [facility]   LAPAROSCOPIC LYSIS OF ADHESIONS N/A 12/14/2013   Procedure: LAPAROSCOPIC LYSIS OF ADHESIONS;  Surgeon: [name], MD;   Location: [facility];  Service: General;  Laterality: N/A;   PATCH ANGIOPLASTY  06/29/2021   Procedure: PATCH ANGIOPLASTY;  Surgeon: Cherre Robins, MD;  Location: [place];  Service: Vascular;;   THROMBECTOMY AND REVISION OF ARTERIOVENTOUS (AV) GORETEX  GRAFT Left 06/29/2021   Procedure: REVISION OF LEFT UPPER EXTREMITY ARTERIOVENOUS GORETEX GRAFT WITH THROMBECTOMY;  Surgeon: Cherre Robins, MD;  [facility];  Service: Vascular;  Laterality: Left;  PERIPHERAL NERVE BLOCK   TOTAL HIP ARTHROPLASTY Left 02/06/2019   Procedure: TOTAL HIP ARTHROPLASTY ANTERIOR APPROACH;  Surgeon: Rod Can, MD;  Location: [place];  Service: Orthopedics;  Laterality: Left;   UMBILICAL HERNIA REPAIR  10/25/2009   open with mesh,  Dr [name]   URETER REVISION  04/2011   [facility]   VENTRAL HERNIA REPAIR N/A 12/14/2013   Procedure: LAPAROSCOPIC VENTRAL HERNIA;  Surgeon: [name], MD;  Location: [place] [facility];  Service: General;  Laterality: N/A;   VIDEO ASSISTED THORACOSCOPY (VATS)/WEDGE RESECTION Left 09/01/2014   Procedure: VIDEO ASSISTED THORACOSCOPY (VATS)/WEDGE RESECTION;  Surgeon: [name], MD;  Location: [place];  Service: Thoracic;  Laterality: Left;   VIDEO BRONCHOSCOPY Bilateral 07/05/2014   Procedure: VIDEO BRONCHOSCOPY WITH FLUORO;  Surgeon: [name], MD;  Location: [facility];  Service: Cardiopulmonary;  Laterality: Bilateral;     OB History   No obstetric history on file.     Family History  Problem Relation Age of Onset   Coronary artery disease Mother    Emphysema Mother        smoked   Ovarian cancer Mother    Heart disease Mother    Stroke Maternal Uncle    Diabetes Maternal Uncle    Kidney disease Maternal Uncle    Liver disease Father     Social History   Tobacco Use   Smoking status: Never   Smokeless tobacco: Never  Vaping Use   Vaping Use: Never used  Substance Use Topics   Alcohol use: Not Currently    Comment: ocassional- not weekly   Drug use: No    Home  Medications Prior to Admission medications   Medication Sig Start Date End Date Taking? Authorizing Provider  albuterol (VENTOLIN HFA) 108 (90 Base) MCG/ACT inhaler Inhale 2 puffs into the lungs daily as needed (Asthma). 02/14/21   [provider]  amLODipine (NORVASC) 5 MG tablet Take 10 mg by mouth at bedtime.    [provider]  budesonide-formoterol (SYMBICORT) 160-4.5 MCG/ACT inhaler Inhale 2 puffs into the lungs 2 (two) times daily.    [provider]  calcium acetate (PHOSLO) 667 MG capsule Take 667-1,334 mg by mouth 3 (three) times daily with meals. 05/14/19   [provider]  cinacalcet (SENSIPAR) 30 MG tablet Take 30 mg by mouth daily after supper.    [provider]  EPOETIN ALFA IJ Inject into the skin See admin instructions. Inject  subcutaneously at dialysis as needed for low hemoglobin    [provider]  Garlic 1497 MG CAPS Take 1,000 mg by mouth in the morning, at noon, in the evening, and at bedtime.    [provider]  hydroxychloroquine (PLAQUENIL) 200 MG tablet Take 200 mg by mouth daily.    [provider]  levothyroxine (SYNTHROID, LEVOTHROID) 50 MCG tablet Take 50-75 mcg by mouth See admin instructions. Take  1 1/2 tablets (75 mcg) by mouth daily on Tuesday and Thursday nights, take 1 tablet (50 mcg) by mouth daily on all other nights of the week    [provider]  losartan (COZAAR) 100 MG tablet Take 100 mg by mouth daily.    [provider]  Multiple Vitamins-Minerals (ZINC PO) Take 22 mg by mouth daily.    [provider]  OVER THE COUNTER MEDICATION Take 4 capsules by mouth 2 (two) times a day. Juice plus - 1 fruit, 1 vegetable, 1 berries, 1 omega - twice daily    [provider]  oxyCODONE-acetaminophen (PERCOCET/ROXICET) 5-325 MG tablet Take 1 tablet by mouth every 6 (six) hours as needed. 06/29/21   Ulyses Amor, PA-C  predniSONE (DELTASONE) 5 MG tablet Take 5 mg  by mouth daily with breakfast.    [provider]  Probiotic Product (PROBIOTIC DAILY PO) Take 1 tablet by mouth daily.    [provider]  PSYLLIUM PO Take 3 capsules by mouth daily at 2 PM.  [provider]  tiotropium (SPIRIVA) 18 MCG inhalation capsule Place 1 puff into inhaler and inhale daily.    [provider]  traZODone (DESYREL) 50 MG tablet Take 50 mg by mouth at bedtime. 05/10/21   [provider]    Allergies    Mircera [methoxy polyethylene glycol-epoetin beta], Mycophenolate mofetil, Dapsone, Pregabalin, Sulfonamide derivatives, and Tramadol  Review of Systems   Review of Systems  All other systems reviewed and are negative.  Physical Exam Updated Vital Signs BP (!) 164/110 (BP Location: Right Arm)    Pulse 64    Temp 98.3 F (36.8 C) (Oral)    Resp 18    Ht 5\' 5"  (1.651 m)    Wt 49.9 kg    SpO2 98%    BMI 18.30 kg/m   Physical Exam Vitals and nursing note reviewed.  Constitutional:      Appearance: She is well-developed.  HENT:     Head: Normocephalic and atraumatic.     Mouth/Throat:     Mouth: Mucous membranes are moist.     Pharynx: Oropharynx is clear.  Eyes:     Pupils: Pupils are equal, round, and reactive to light.  Cardiovascular:     Rate and Rhythm: Normal rate and regular rhythm.  Pulmonary:     Effort: Pulmonary effort is normal. No respiratory distress.     Breath sounds: No stridor.  Abdominal:     General: Abdomen is flat. There is no distension.  Musculoskeletal:        General: Normal range of motion.     Cervical back: Normal range of motion.     Comments: Pressure dressing in place in left upper extremity  Skin:    General: Skin is warm and dry.  Neurological:     General: No focal deficit present.     Mental Status: She is alert.    ED Results / Procedures / Treatments   Labs (all labs ordered are listed, but only abnormal results are displayed) Labs Reviewed  CBC WITH  DIFFERENTIAL/PLATELET - Abnormal; Notable for the following components:      Result Value   RBC 3.73 (*)    Hemoglobin 11.1 (*)    HCT 35.7 (*)    All other components within normal limits  COMPREHENSIVE METABOLIC PANEL - Abnormal; Notable for the following components:   Chloride 95 (*)    BUN 46 (*)    Creatinine, Ser 6.77 (*)    AST 9 (*)    GFR, Estimated 6 (*)    All other components within normal limits    EKG None  Radiology No results found.  Procedures Procedures   Medications Ordered in ED Medications - No data to display   ED Course  I have reviewed the triage vital signs and the nursing notes.  Pertinent labs & imaging results that were available during my care of the patient were reviewed by me and considered in my medical decision making (see chart for details).  Clinical Course as of 09/22/21 2246  Sat Sep 22, 2021  0148 BP(!): 179/86 [JM]  0148 BP(!): 155/78 [JM]  0148 BP(!): 195/76 Persisently high BP here - probably related to bleeding. Will give an extra dose of home antihypertensives.  [JM]  0149 Wrap in place. No bleeding currently. Will wait a few minutes and recheck. Likely will need to miss dialysis tomorrow. Will check labs to ensure K and Hb ok to miss dialysis in the morning and ensure no significant  blood loss.  [JM]  0300 At around this time I took down the wrap. Visualized a pinpoint area in proximal area of scar tissue over her graft that she identified as the area previously bleeding. No bleeding currently. Watched without dressing for approximately 5 minutes without bleeding. Re dressed with nonstick gauze, pressure dressing. Still awaiting K at time of discharge, but she did not want to wait as she was sure it was fine. She will address BP with nephrology/PCP.  [JM]    Clinical Course User Index [JM] Ryszard Socarras, Corene Cornea, MD   MDM Rules/Calculators/A&P                         Final Clinical Impression(s) / ED Diagnoses Final diagnoses:   Secondary hypertension  Malfunction of arteriovenous graft, initial encounter [facility])    Rx / DC Orders ED Discharge Orders     None        Adiel Mcnamara, Corene Cornea, MD 09/22/21 2246

## 2021-09-25 DIAGNOSIS — T82858A Stenosis of vascular prosthetic devices, implants and grafts, initial encounter: Secondary | ICD-10-CM | POA: Diagnosis not present

## 2021-09-25 DIAGNOSIS — Z992 Dependence on renal dialysis: Secondary | ICD-10-CM | POA: Diagnosis not present

## 2021-09-25 DIAGNOSIS — N186 End stage renal disease: Secondary | ICD-10-CM | POA: Diagnosis not present

## 2021-09-25 DIAGNOSIS — T82868A Thrombosis of vascular prosthetic devices, implants and grafts, initial encounter: Secondary | ICD-10-CM | POA: Diagnosis not present

## 2021-09-26 DIAGNOSIS — E039 Hypothyroidism, unspecified: Secondary | ICD-10-CM | POA: Diagnosis not present

## 2021-09-26 DIAGNOSIS — T8249XA Other complication of vascular dialysis catheter, initial encounter: Secondary | ICD-10-CM | POA: Diagnosis not present

## 2021-09-26 DIAGNOSIS — N186 End stage renal disease: Secondary | ICD-10-CM | POA: Diagnosis not present

## 2021-09-26 DIAGNOSIS — D631 Anemia in chronic kidney disease: Secondary | ICD-10-CM | POA: Diagnosis not present

## 2021-09-26 DIAGNOSIS — N2581 Secondary hyperparathyroidism of renal origin: Secondary | ICD-10-CM | POA: Diagnosis not present

## 2021-09-26 DIAGNOSIS — D689 Coagulation defect, unspecified: Secondary | ICD-10-CM | POA: Diagnosis not present

## 2021-09-26 DIAGNOSIS — Z992 Dependence on renal dialysis: Secondary | ICD-10-CM | POA: Diagnosis not present

## 2021-09-26 DIAGNOSIS — E1129 Type 2 diabetes mellitus with other diabetic kidney complication: Secondary | ICD-10-CM | POA: Diagnosis not present

## 2021-09-27 DIAGNOSIS — D631 Anemia in chronic kidney disease: Secondary | ICD-10-CM | POA: Diagnosis not present

## 2021-09-27 DIAGNOSIS — N186 End stage renal disease: Secondary | ICD-10-CM | POA: Diagnosis not present

## 2021-09-27 DIAGNOSIS — Z992 Dependence on renal dialysis: Secondary | ICD-10-CM | POA: Diagnosis not present

## 2021-09-27 DIAGNOSIS — N2581 Secondary hyperparathyroidism of renal origin: Secondary | ICD-10-CM | POA: Diagnosis not present

## 2021-09-27 DIAGNOSIS — E1129 Type 2 diabetes mellitus with other diabetic kidney complication: Secondary | ICD-10-CM | POA: Diagnosis not present

## 2021-09-27 DIAGNOSIS — T8249XA Other complication of vascular dialysis catheter, initial encounter: Secondary | ICD-10-CM | POA: Diagnosis not present

## 2021-09-27 DIAGNOSIS — E039 Hypothyroidism, unspecified: Secondary | ICD-10-CM | POA: Diagnosis not present

## 2021-09-27 DIAGNOSIS — D689 Coagulation defect, unspecified: Secondary | ICD-10-CM | POA: Diagnosis not present

## 2021-09-29 DIAGNOSIS — T8249XA Other complication of vascular dialysis catheter, initial encounter: Secondary | ICD-10-CM | POA: Diagnosis not present

## 2021-09-29 DIAGNOSIS — N186 End stage renal disease: Secondary | ICD-10-CM | POA: Diagnosis not present

## 2021-09-29 DIAGNOSIS — E1129 Type 2 diabetes mellitus with other diabetic kidney complication: Secondary | ICD-10-CM | POA: Diagnosis not present

## 2021-09-29 DIAGNOSIS — E039 Hypothyroidism, unspecified: Secondary | ICD-10-CM | POA: Diagnosis not present

## 2021-09-29 DIAGNOSIS — D689 Coagulation defect, unspecified: Secondary | ICD-10-CM | POA: Diagnosis not present

## 2021-09-29 DIAGNOSIS — Z992 Dependence on renal dialysis: Secondary | ICD-10-CM | POA: Diagnosis not present

## 2021-09-29 DIAGNOSIS — D631 Anemia in chronic kidney disease: Secondary | ICD-10-CM | POA: Diagnosis not present

## 2021-09-29 DIAGNOSIS — N2581 Secondary hyperparathyroidism of renal origin: Secondary | ICD-10-CM | POA: Diagnosis not present

## 2021-10-02 ENCOUNTER — Other Ambulatory Visit: Payer: Self-pay | Admitting: *Deleted

## 2021-10-02 ENCOUNTER — Other Ambulatory Visit: Payer: Self-pay

## 2021-10-02 ENCOUNTER — Telehealth: Payer: Self-pay

## 2021-10-02 DIAGNOSIS — Z992 Dependence on renal dialysis: Secondary | ICD-10-CM | POA: Diagnosis not present

## 2021-10-02 DIAGNOSIS — T8249XA Other complication of vascular dialysis catheter, initial encounter: Secondary | ICD-10-CM | POA: Diagnosis not present

## 2021-10-02 DIAGNOSIS — N186 End stage renal disease: Secondary | ICD-10-CM | POA: Diagnosis not present

## 2021-10-02 DIAGNOSIS — N2581 Secondary hyperparathyroidism of renal origin: Secondary | ICD-10-CM | POA: Diagnosis not present

## 2021-10-02 DIAGNOSIS — D631 Anemia in chronic kidney disease: Secondary | ICD-10-CM | POA: Diagnosis not present

## 2021-10-02 DIAGNOSIS — D689 Coagulation defect, unspecified: Secondary | ICD-10-CM | POA: Diagnosis not present

## 2021-10-02 DIAGNOSIS — E1129 Type 2 diabetes mellitus with other diabetic kidney complication: Secondary | ICD-10-CM | POA: Diagnosis not present

## 2021-10-02 DIAGNOSIS — L819 Disorder of pigmentation, unspecified: Secondary | ICD-10-CM

## 2021-10-02 DIAGNOSIS — E039 Hypothyroidism, unspecified: Secondary | ICD-10-CM | POA: Diagnosis not present

## 2021-10-02 NOTE — Telephone Encounter (Signed)
Pt called to schedule an appt with MD due to LUE AVG that is not accessible. She is dialyzing through a chest catheter for the time being. Pt is requesting to see MD. Pt is also wanting to be seen at this same appt for her "grey foot". She states her L foot has been "grey" in color for a year or more and is worsening. Pt has been scheduled for studies and to see MD.

## 2021-10-03 ENCOUNTER — Other Ambulatory Visit: Payer: Self-pay

## 2021-10-03 ENCOUNTER — Ambulatory Visit (INDEPENDENT_AMBULATORY_CARE_PROVIDER_SITE_OTHER)
Admission: RE | Admit: 2021-10-03 | Discharge: 2021-10-03 | Disposition: A | Discharge status: Home / Self Care | Payer: Medicare Other | Source: Ambulatory Visit | Attending: Vascular Surgery | Admitting: Vascular Surgery

## 2021-10-03 ENCOUNTER — Ambulatory Visit (HOSPITAL_COMMUNITY)
Admission: RE | Admit: 2021-10-03 | Discharge: 2021-10-03 | Disposition: A | Discharge status: Home / Self Care | Payer: Medicare Other | Source: Ambulatory Visit | Attending: Vascular Surgery | Admitting: Vascular Surgery

## 2021-10-03 ENCOUNTER — Encounter: Payer: Self-pay | Admitting: Vascular Surgery

## 2021-10-03 ENCOUNTER — Ambulatory Visit: Payer: Medicare Other | Admitting: Vascular Surgery

## 2021-10-03 VITALS — BP 157/82 | HR 64 | Temp 97.7°F | Resp 20 | Ht 65.0 in | Wt 109.0 lb

## 2021-10-03 DIAGNOSIS — Z992 Dependence on renal dialysis: Secondary | ICD-10-CM | POA: Diagnosis not present

## 2021-10-03 DIAGNOSIS — N186 End stage renal disease: Secondary | ICD-10-CM | POA: Diagnosis not present

## 2021-10-03 NOTE — Progress Notes (Signed)
Patient ID: Kimberly [name], female   DOB: Sep 19, 1956, 66 y.o.   MRN: [id number]  Reason for Consult: Follow-up   Referred by [name], MD  Subjective:     HPI:  Kimberly [name] is a 66 y.o. female history of end-stage renal disease I previously placed a graft in 2019.  More recently she had this revised and had significant bruising.  She was having bleeding from the graft 2 weeks ago went to the ER she states since then she has had no thrill or bruit in the graft and she is now dialyzing via catheter.  She does have a scab over the graft where she was previously bleeding but does not have any infection.  She denies any fevers.  She denies any previous right upper extremity surgeries, no chest or breast surgeries and no previous pacemakers or ports other than her current tunneled catheter on the right.  Past Medical History:  Diagnosis Date   Anemia    Arthritis    Asthma    Complication of anesthesia    Slow to awaken and AMS- took 1 month to memory and recall to return   COVID    Diverticulosis    Dyspnea    with exertion. when walking up stairs or walking a distance   First degree AV block    GERD (gastroesophageal reflux disease)    Heart murmur    child   History of blood transfusion    History of chronic cough    History of hiatal hernia    History of hoarseness    Hypertension    history; resolved on dialysis   Hypothyroidism    IgA nephropathy    ESRD - Hemo TTH Sat   Pneumonia    Premature atrial beat    Premature ventricular beat    Renal transplant failure and rejection 2012   Shingles 03/2011   Sleep apnea    uses a mouth guard   Stroke [facility]) October 2010, March 2013   cerebral aneurysm, 07/2017   Family History  Problem Relation Age of Onset   Coronary artery disease Mother    Emphysema Mother        smoked   Ovarian cancer Mother    Heart disease Mother    Stroke Maternal Uncle    Diabetes Maternal Uncle    Kidney disease Maternal Uncle     Liver disease Father    Past Surgical History:  Procedure Laterality Date   AV FISTULA PLACEMENT Left 05/20/2018   Procedure: INSERTION OF ARTERIOVENOUS (AV) GORE-TEX GRAFT LEFT  ARM;  Surgeon: [name], MD;  Location: [place];  Service: Vascular;  Laterality: Left;   CAPD INSERTION  08/25/2008   open, Dr [name]   CAPD INSERTION N/A 12/14/2013   Procedure: LAPAROSCOPIC INSERTION CONTINUOUS AMBULATORY PERITONEAL DIALYSIS  (CAPD) CATHETER;  Surgeon: [name], MD;  Location: [place];  Service: General;  Laterality: N/A;   CAPD REMOVAL  03/22/2010   Dr [name]   COLONOSCOPY     ESOPHAGOGASTRODUODENOSCOPY     EYE SURGERY Bilateral    radial keratotomy   Hemodialysis catheter Right 02/13/2018   INSERTION OF MESH N/A 12/14/2013   Procedure: INSERTION OF MESH;  Surgeon: [name], MD;  Location: [place];  Service: General;  Laterality: N/A;   KIDNEY TRANSPLANT Right 01/2010   [facility]   LAPAROSCOPIC LYSIS OF ADHESIONS N/A 12/14/2013   Procedure: LAPAROSCOPIC LYSIS OF ADHESIONS;  Surgeon: [name],  MD;  Location: [place];  Service: General;  Laterality: N/A;   PATCH ANGIOPLASTY  06/29/2021   Procedure: PATCH ANGIOPLASTY;  Surgeon: Cherre Robins, MD;  Location: [place];  Service: Vascular;;   THROMBECTOMY AND REVISION OF ARTERIOVENTOUS (AV) GORETEX  GRAFT Left 06/29/2021   Procedure: REVISION OF LEFT UPPER EXTREMITY ARTERIOVENOUS GORETEX GRAFT WITH THROMBECTOMY;  Surgeon: Cherre Robins, MD;  Location: [place];  Service: Vascular;  Laterality: Left;  PERIPHERAL NERVE BLOCK   TOTAL HIP ARTHROPLASTY Left 02/06/2019   Procedure: TOTAL HIP ARTHROPLASTY ANTERIOR APPROACH;  Surgeon: Rod Can, MD;  Location: [place];  Service: Orthopedics;  Laterality: Left;   UMBILICAL HERNIA REPAIR  10/25/2009   open with mesh,  Dr [name]   URETER REVISION  04/2011   [facility]   VENTRAL HERNIA REPAIR N/A 12/14/2013   Procedure: LAPAROSCOPIC VENTRAL HERNIA;  Surgeon: [name], MD;   Location: [place];  Service: General;  Laterality: N/A;   [place] (VATS)/WEDGE RESECTION Left 09/01/2014   Procedure: VIDEO ASSISTED THORACOSCOPY (VATS)/WEDGE RESECTION;  Surgeon: [name], MD;  Location: [place];  Service: Thoracic;  Laterality: Left;   VIDEO BRONCHOSCOPY Bilateral 07/05/2014   Procedure: VIDEO BRONCHOSCOPY WITH FLUORO;  Surgeon: [name], MD;  [facility];  Service: Cardiopulmonary;  Laterality: Bilateral;    Short Social History:  Social History   Tobacco Use   Smoking status: Never   Smokeless tobacco: Never  Substance Use Topics   Alcohol use: Not Currently    Comment: ocassional- not weekly    Allergies  Allergen Reactions   Mircera [Methoxy Polyethylene Glycol-Epoetin Beta] Other (See Comments)    Severe joint and muscle pain   Mycophenolate Mofetil     Other reaction(s): diarrhea   Dapsone Rash and Other (See Comments)    Pain in feet   Pregabalin Rash and Other (See Comments)    Fever - reaction to Lyrica   Sulfonamide Derivatives Rash   Tramadol Rash and Other (See Comments)    Rash on cheek    Current Outpatient Medications  Medication Sig Dispense Refill   albuterol (VENTOLIN HFA) 108 (90 Base) MCG/ACT inhaler Inhale 2 puffs into the lungs daily as needed (Asthma).     amLODipine (NORVASC) 10 MG tablet Take by mouth.     budesonide-formoterol (SYMBICORT) 160-4.5 MCG/ACT inhaler Inhale 2 puffs into the lungs 2 (two) times daily.     calcium acetate (PHOSLO) 667 MG capsule Take 667-1,334 mg by mouth 3 (three) times daily with meals.     cinacalcet (SENSIPAR) 30 MG tablet Take 30 mg by mouth daily after supper.     EPOETIN ALFA IJ Inject into the skin See admin instructions. Inject  subcutaneously at dialysis as needed for low hemoglobin     Garlic 3536 MG CAPS Take 1,000 mg by mouth in the morning, at noon, in the evening, and at bedtime.     hydroxychloroquine (PLAQUENIL) 200 MG tablet Take 200 mg by  mouth daily.     levothyroxine (SYNTHROID, LEVOTHROID) 50 MCG tablet Take 50-75 mcg by mouth See admin instructions. Take  1 1/2 tablets (75 mcg) by mouth daily on Tuesday and Thursday nights, take 1 tablet (50 mcg) by mouth daily on all other nights of the week     losartan (COZAAR) 100 MG tablet Take 100 mg by mouth daily.     Multiple Vitamins-Minerals (ZINC PO) Take 22 mg by mouth daily.     OVER THE COUNTER  MEDICATION Take 4 capsules by mouth 2 (two) times a day. Juice plus - 1 fruit, 1 vegetable, 1 berries, 1 omega - twice daily     predniSONE (DELTASONE) 5 MG tablet Take 5 mg by mouth daily with breakfast.     Probiotic Product (PROBIOTIC DAILY PO) Take 1 tablet by mouth daily.     PSYLLIUM PO Take 3 capsules by mouth daily at 2 PM.      tiotropium (SPIRIVA) 18 MCG inhalation capsule Place 1 puff into inhaler and inhale daily.     traZODone (DESYREL) 50 MG tablet Take 50 mg by mouth at bedtime.     No current facility-administered medications for this visit.    Review of Systems  Constitutional:  Constitutional negative. HENT: HENT negative.  Eyes: Eyes negative.  Respiratory: Respiratory negative.  Cardiovascular: Cardiovascular negative.  GI: Gastrointestinal negative.  Skin:       Discoloration left foot Neurological: Neurological negative. Hematologic: Hematologic/lymphatic negative.  Psychiatric: Psychiatric negative.       Objective:  Objective   Vitals:   10/03/21 1149  BP: (!) 157/82  Pulse: 64  Resp: 20  Temp: 97.7 F (36.5 C)  SpO2: 94%  Weight: 109 lb (49.4 kg)  Height: 5\' 5"  (1.651 m)   Body mass index is 18.14 kg/m.  Physical Exam HENT:     Head: Normocephalic.     Nose:     Comments: Wearing a mask Eyes:     Pupils: Pupils are equal, round, and reactive to light.  Cardiovascular:     Pulses:          Radial pulses are 2+ on the right side and 2+ on the left side.       Dorsalis pedis pulses are 2+ on the left side.  Pulmonary:     Effort:  Pulmonary effort is normal.  Abdominal:     General: Abdomen is flat.     Palpations: Abdomen is soft.  Musculoskeletal:        General: Normal range of motion.  Skin:    General: Skin is warm and dry.     Capillary Refill: Capillary refill takes less than 2 seconds.  Neurological:     General: No focal deficit present.     Mental Status: She is alert.  Psychiatric:        Mood and Affect: Mood normal.        Behavior: Behavior normal.        Thought Content: Thought content normal.    Data:  AVG                  PSV (cm/s) Flow Vol (mL/min)   Describe      +--------------------+----------+-----------------+-------------+   Native artery inflow     30                                       +--------------------+----------+-----------------+-------------+   Arterial anastomosis                                occluded      +--------------------+----------+-----------------+-------------+   Prox graft  occluded      +--------------------+----------+-----------------+-------------+   Mid graft                                           occluded      +--------------------+----------+-----------------+-------------+   Distal graft                                        occluded      +--------------------+----------+-----------------+-------------+   Venous anastomosis                                  occluded      +--------------------+----------+-----------------+-------------+   Venous outflow           24                       axillary vein   +--------------------+----------+-----------------+-------------+  Subclavian vein patent.      Summary:  Left brachial artery to axillary vein HD graft is occluded.  The axillary and subclavian veins are patent.      Assessment/Plan:     66 year old female now with occluded left arm AV graft.  This does not appear to be salvageable given the overlying scab and also the discolored skin from her  previous surgery.  There is not appear to be any infection hopefully the scab will heal.  We discussed proceeding with right arm access and she does not appear to have any veins there we will plan for more likely graft of the right upper extremity on a nondialysis day in the near future.     [name] MD [facility]

## 2021-10-03 NOTE — H&P (View-Only) (Signed)
Patient ID: Kimberly [name], female   DOB: 10-09-1955, 66 y.o.   MRN: [id number]  Reason for Consult: Follow-up   Referred by [name], MD  Subjective:     HPI:  Kimberly [name] is a 66 y.o. female history of end-stage renal disease I previously placed a graft in 2019.  More recently she had this revised and had significant bruising.  She was having bleeding from the graft 2 weeks ago went to the ER she states since then she has had no thrill or bruit in the graft and she is now dialyzing via catheter.  She does have a scab over the graft where she was previously bleeding but does not have any infection.  She denies any fevers.  She denies any previous right upper extremity surgeries, no chest or breast surgeries and no previous pacemakers or ports other than her current tunneled catheter on the right.  Past Medical History:  Diagnosis Date   Anemia    Arthritis    Asthma    Complication of anesthesia    Slow to awaken and AMS- took 1 month to memory and recall to return   COVID    Diverticulosis    Dyspnea    with exertion. when walking up stairs or walking a distance   First degree AV block    GERD (gastroesophageal reflux disease)    Heart murmur    child   History of blood transfusion    History of chronic cough    History of hiatal hernia    History of hoarseness    Hypertension    history; resolved on dialysis   Hypothyroidism    IgA nephropathy    ESRD - Hemo TTH Sat   Pneumonia    Premature atrial beat    Premature ventricular beat    Renal transplant failure and rejection 2012   Shingles 03/2011   Sleep apnea    uses a mouth guard   Stroke [facility]) October 2010, March 2013   cerebral aneurysm, 07/2017   Family History  Problem Relation Age of Onset   Coronary artery disease Mother    Emphysema Mother        smoked   Ovarian cancer Mother    Heart disease Mother    Stroke Maternal Uncle    Diabetes Maternal Uncle    Kidney disease Maternal Uncle     Liver disease Father    Past Surgical History:  Procedure Laterality Date   AV FISTULA PLACEMENT Left 05/20/2018   Procedure: INSERTION OF ARTERIOVENOUS (AV) GORE-TEX GRAFT LEFT  ARM;  Surgeon: [name], MD;  Location: [place];  Service: Vascular;  Laterality: Left;   CAPD INSERTION  08/25/2008   open, Dr [name]   CAPD INSERTION N/A 12/14/2013   Procedure: LAPAROSCOPIC INSERTION CONTINUOUS AMBULATORY PERITONEAL DIALYSIS  (CAPD) CATHETER;  Surgeon: [name], MD;  Location: [place];  Service: General;  Laterality: N/A;   CAPD REMOVAL  03/22/2010   Dr [name]   COLONOSCOPY     ESOPHAGOGASTRODUODENOSCOPY     EYE SURGERY Bilateral    radial keratotomy   Hemodialysis catheter Right 02/13/2018   INSERTION OF MESH N/A 12/14/2013   Procedure: INSERTION OF MESH;  Surgeon: [name], MD;  Location: [place];  Service: General;  Laterality: N/A;   KIDNEY TRANSPLANT Right 01/2010   [facility]   LAPAROSCOPIC LYSIS OF ADHESIONS N/A 12/14/2013   Procedure: LAPAROSCOPIC LYSIS OF ADHESIONS;  Surgeon: [name],  MD;  Location: [place];  Service: General;  Laterality: N/A;   PATCH ANGIOPLASTY  06/29/2021   Procedure: PATCH ANGIOPLASTY;  Surgeon: Cherre Robins, MD;  Location: [place];  Service: Vascular;;   THROMBECTOMY AND REVISION OF ARTERIOVENTOUS (AV) GORETEX  GRAFT Left 06/29/2021   Procedure: REVISION OF LEFT UPPER EXTREMITY ARTERIOVENOUS GORETEX GRAFT WITH THROMBECTOMY;  Surgeon: Cherre Robins, MD;  Location: [place];  Service: Vascular;  Laterality: Left;  PERIPHERAL NERVE BLOCK   TOTAL HIP ARTHROPLASTY Left 02/06/2019   Procedure: TOTAL HIP ARTHROPLASTY ANTERIOR APPROACH;  Surgeon: Rod Can, MD;  Location: [place];  Service: Orthopedics;  Laterality: Left;   UMBILICAL HERNIA REPAIR  10/25/2009   open with mesh,  Dr [name]   URETER REVISION  04/2011   [facility]   VENTRAL HERNIA REPAIR N/A 12/14/2013   Procedure: LAPAROSCOPIC VENTRAL HERNIA;  Surgeon: [name], MD;   Location: [place];  Service: General;  Laterality: N/A;   [place] (VATS)/WEDGE RESECTION Left 09/01/2014   Procedure: VIDEO ASSISTED THORACOSCOPY (VATS)/WEDGE RESECTION;  Surgeon: [name], MD;  Location: [place];  Service: Thoracic;  Laterality: Left;   VIDEO BRONCHOSCOPY Bilateral 07/05/2014   Procedure: VIDEO BRONCHOSCOPY WITH FLUORO;  Surgeon: [name], MD;  Location: [facility];  Service: Cardiopulmonary;  Laterality: Bilateral;    Short Social History:  Social History   Tobacco Use   Smoking status: Never   Smokeless tobacco: Never  Substance Use Topics   Alcohol use: Not Currently    Comment: ocassional- not weekly    Allergies  Allergen Reactions   Mircera [Methoxy Polyethylene Glycol-Epoetin Beta] Other (See Comments)    Severe joint and muscle pain   Mycophenolate Mofetil     Other reaction(s): diarrhea   Dapsone Rash and Other (See Comments)    Pain in feet   Pregabalin Rash and Other (See Comments)    Fever - reaction to Lyrica   Sulfonamide Derivatives Rash   Tramadol Rash and Other (See Comments)    Rash on cheek    Current Outpatient Medications  Medication Sig Dispense Refill   albuterol (VENTOLIN HFA) 108 (90 Base) MCG/ACT inhaler Inhale 2 puffs into the lungs daily as needed (Asthma).     amLODipine (NORVASC) 10 MG tablet Take by mouth.     budesonide-formoterol (SYMBICORT) 160-4.5 MCG/ACT inhaler Inhale 2 puffs into the lungs 2 (two) times daily.     calcium acetate (PHOSLO) 667 MG capsule Take 667-1,334 mg by mouth 3 (three) times daily with meals.     cinacalcet (SENSIPAR) 30 MG tablet Take 30 mg by mouth daily after supper.     EPOETIN ALFA IJ Inject into the skin See admin instructions. Inject  subcutaneously at dialysis as needed for low hemoglobin     Garlic 9563 MG CAPS Take 1,000 mg by mouth in the morning, at noon, in the evening, and at bedtime.     hydroxychloroquine (PLAQUENIL) 200 MG tablet Take 200 mg by  mouth daily.     levothyroxine (SYNTHROID, LEVOTHROID) 50 MCG tablet Take 50-75 mcg by mouth See admin instructions. Take  1 1/2 tablets (75 mcg) by mouth daily on Tuesday and Thursday nights, take 1 tablet (50 mcg) by mouth daily on all other nights of the week     losartan (COZAAR) 100 MG tablet Take 100 mg by mouth daily.     Multiple Vitamins-Minerals (ZINC PO) Take 22 mg by mouth daily.     OVER THE COUNTER  MEDICATION Take 4 capsules by mouth 2 (two) times a day. Juice plus - 1 fruit, 1 vegetable, 1 berries, 1 omega - twice daily     predniSONE (DELTASONE) 5 MG tablet Take 5 mg by mouth daily with breakfast.     Probiotic Product (PROBIOTIC DAILY PO) Take 1 tablet by mouth daily.     PSYLLIUM PO Take 3 capsules by mouth daily at 2 PM.      tiotropium (SPIRIVA) 18 MCG inhalation capsule Place 1 puff into inhaler and inhale daily.     traZODone (DESYREL) 50 MG tablet Take 50 mg by mouth at bedtime.     No current facility-administered medications for this visit.    Review of Systems  Constitutional:  Constitutional negative. HENT: HENT negative.  Eyes: Eyes negative.  Respiratory: Respiratory negative.  Cardiovascular: Cardiovascular negative.  GI: Gastrointestinal negative.  Skin:       Discoloration left foot Neurological: Neurological negative. Hematologic: Hematologic/lymphatic negative.  Psychiatric: Psychiatric negative.       Objective:  Objective   Vitals:   10/03/21 1149  BP: (!) 157/82  Pulse: 64  Resp: 20  Temp: 97.7 F (36.5 C)  SpO2: 94%  Weight: 109 lb (49.4 kg)  Height: 5\' 5"  (1.651 m)   Body mass index is 18.14 kg/m.  Physical Exam HENT:     Head: Normocephalic.     Nose:     Comments: Wearing a mask Eyes:     Pupils: Pupils are equal, round, and reactive to light.  Cardiovascular:     Pulses:          Radial pulses are 2+ on the right side and 2+ on the left side.       Dorsalis pedis pulses are 2+ on the left side.  Pulmonary:     Effort:  Pulmonary effort is normal.  Abdominal:     General: Abdomen is flat.     Palpations: Abdomen is soft.  Musculoskeletal:        General: Normal range of motion.  Skin:    General: Skin is warm and dry.     Capillary Refill: Capillary refill takes less than 2 seconds.  Neurological:     General: No focal deficit present.     Mental Status: She is alert.  Psychiatric:        Mood and Affect: Mood normal.        Behavior: Behavior normal.        Thought Content: Thought content normal.    Data:  AVG                  PSV (cm/s) Flow Vol (mL/min)   Describe      +--------------------+----------+-----------------+-------------+   Native artery inflow     30                                       +--------------------+----------+-----------------+-------------+   Arterial anastomosis                                occluded      +--------------------+----------+-----------------+-------------+   Prox graft  occluded      +--------------------+----------+-----------------+-------------+   Mid graft                                           occluded      +--------------------+----------+-----------------+-------------+   Distal graft                                        occluded      +--------------------+----------+-----------------+-------------+   Venous anastomosis                                  occluded      +--------------------+----------+-----------------+-------------+   Venous outflow           24                       axillary vein   +--------------------+----------+-----------------+-------------+  Subclavian vein patent.      Summary:  Left brachial artery to axillary vein HD graft is occluded.  The axillary and subclavian veins are patent.      Assessment/Plan:     66 year old female now with occluded left arm AV graft.  This does not appear to be salvageable given the overlying scab and also the discolored skin from her  previous surgery.  There is not appear to be any infection hopefully the scab will heal.  We discussed proceeding with right arm access and she does not appear to have any veins there we will plan for more likely graft of the right upper extremity on a nondialysis day in the near future.     [name] MD [facility]

## 2021-10-04 DIAGNOSIS — N2581 Secondary hyperparathyroidism of renal origin: Secondary | ICD-10-CM | POA: Diagnosis not present

## 2021-10-04 DIAGNOSIS — E1129 Type 2 diabetes mellitus with other diabetic kidney complication: Secondary | ICD-10-CM | POA: Diagnosis not present

## 2021-10-04 DIAGNOSIS — N186 End stage renal disease: Secondary | ICD-10-CM | POA: Diagnosis not present

## 2021-10-04 DIAGNOSIS — D631 Anemia in chronic kidney disease: Secondary | ICD-10-CM | POA: Diagnosis not present

## 2021-10-04 DIAGNOSIS — E039 Hypothyroidism, unspecified: Secondary | ICD-10-CM | POA: Diagnosis not present

## 2021-10-04 DIAGNOSIS — T8249XA Other complication of vascular dialysis catheter, initial encounter: Secondary | ICD-10-CM | POA: Diagnosis not present

## 2021-10-04 DIAGNOSIS — Z992 Dependence on renal dialysis: Secondary | ICD-10-CM | POA: Diagnosis not present

## 2021-10-04 DIAGNOSIS — D689 Coagulation defect, unspecified: Secondary | ICD-10-CM | POA: Diagnosis not present

## 2021-10-05 ENCOUNTER — Encounter (HOSPITAL_COMMUNITY): Payer: Medicare Other

## 2021-10-06 DIAGNOSIS — D631 Anemia in chronic kidney disease: Secondary | ICD-10-CM | POA: Diagnosis not present

## 2021-10-06 DIAGNOSIS — Z992 Dependence on renal dialysis: Secondary | ICD-10-CM | POA: Diagnosis not present

## 2021-10-06 DIAGNOSIS — E1129 Type 2 diabetes mellitus with other diabetic kidney complication: Secondary | ICD-10-CM | POA: Diagnosis not present

## 2021-10-06 DIAGNOSIS — D689 Coagulation defect, unspecified: Secondary | ICD-10-CM | POA: Diagnosis not present

## 2021-10-06 DIAGNOSIS — N186 End stage renal disease: Secondary | ICD-10-CM | POA: Diagnosis not present

## 2021-10-06 DIAGNOSIS — E039 Hypothyroidism, unspecified: Secondary | ICD-10-CM | POA: Diagnosis not present

## 2021-10-06 DIAGNOSIS — T8249XA Other complication of vascular dialysis catheter, initial encounter: Secondary | ICD-10-CM | POA: Diagnosis not present

## 2021-10-06 DIAGNOSIS — N2581 Secondary hyperparathyroidism of renal origin: Secondary | ICD-10-CM | POA: Diagnosis not present

## 2021-10-09 DIAGNOSIS — E1129 Type 2 diabetes mellitus with other diabetic kidney complication: Secondary | ICD-10-CM | POA: Diagnosis not present

## 2021-10-09 DIAGNOSIS — E039 Hypothyroidism, unspecified: Secondary | ICD-10-CM | POA: Diagnosis not present

## 2021-10-09 DIAGNOSIS — Z992 Dependence on renal dialysis: Secondary | ICD-10-CM | POA: Diagnosis not present

## 2021-10-09 DIAGNOSIS — N2581 Secondary hyperparathyroidism of renal origin: Secondary | ICD-10-CM | POA: Diagnosis not present

## 2021-10-09 DIAGNOSIS — N186 End stage renal disease: Secondary | ICD-10-CM | POA: Diagnosis not present

## 2021-10-09 DIAGNOSIS — D689 Coagulation defect, unspecified: Secondary | ICD-10-CM | POA: Diagnosis not present

## 2021-10-09 DIAGNOSIS — D631 Anemia in chronic kidney disease: Secondary | ICD-10-CM | POA: Diagnosis not present

## 2021-10-09 DIAGNOSIS — T8249XA Other complication of vascular dialysis catheter, initial encounter: Secondary | ICD-10-CM | POA: Diagnosis not present

## 2021-10-10 ENCOUNTER — Ambulatory Visit: Payer: Medicare Other | Admitting: Vascular Surgery

## 2021-10-11 DIAGNOSIS — D631 Anemia in chronic kidney disease: Secondary | ICD-10-CM | POA: Diagnosis not present

## 2021-10-11 DIAGNOSIS — D689 Coagulation defect, unspecified: Secondary | ICD-10-CM | POA: Diagnosis not present

## 2021-10-11 DIAGNOSIS — E1129 Type 2 diabetes mellitus with other diabetic kidney complication: Secondary | ICD-10-CM | POA: Diagnosis not present

## 2021-10-11 DIAGNOSIS — N186 End stage renal disease: Secondary | ICD-10-CM | POA: Diagnosis not present

## 2021-10-11 DIAGNOSIS — T8249XA Other complication of vascular dialysis catheter, initial encounter: Secondary | ICD-10-CM | POA: Diagnosis not present

## 2021-10-11 DIAGNOSIS — N2581 Secondary hyperparathyroidism of renal origin: Secondary | ICD-10-CM | POA: Diagnosis not present

## 2021-10-11 DIAGNOSIS — E039 Hypothyroidism, unspecified: Secondary | ICD-10-CM | POA: Diagnosis not present

## 2021-10-11 DIAGNOSIS — Z992 Dependence on renal dialysis: Secondary | ICD-10-CM | POA: Diagnosis not present

## 2021-10-13 DIAGNOSIS — N2581 Secondary hyperparathyroidism of renal origin: Secondary | ICD-10-CM | POA: Diagnosis not present

## 2021-10-13 DIAGNOSIS — E039 Hypothyroidism, unspecified: Secondary | ICD-10-CM | POA: Diagnosis not present

## 2021-10-13 DIAGNOSIS — N186 End stage renal disease: Secondary | ICD-10-CM | POA: Diagnosis not present

## 2021-10-13 DIAGNOSIS — E1129 Type 2 diabetes mellitus with other diabetic kidney complication: Secondary | ICD-10-CM | POA: Diagnosis not present

## 2021-10-13 DIAGNOSIS — D689 Coagulation defect, unspecified: Secondary | ICD-10-CM | POA: Diagnosis not present

## 2021-10-13 DIAGNOSIS — T8249XA Other complication of vascular dialysis catheter, initial encounter: Secondary | ICD-10-CM | POA: Diagnosis not present

## 2021-10-13 DIAGNOSIS — Z992 Dependence on renal dialysis: Secondary | ICD-10-CM | POA: Diagnosis not present

## 2021-10-13 DIAGNOSIS — D631 Anemia in chronic kidney disease: Secondary | ICD-10-CM | POA: Diagnosis not present

## 2021-10-16 DIAGNOSIS — E039 Hypothyroidism, unspecified: Secondary | ICD-10-CM | POA: Diagnosis not present

## 2021-10-16 DIAGNOSIS — N186 End stage renal disease: Secondary | ICD-10-CM | POA: Diagnosis not present

## 2021-10-16 DIAGNOSIS — D631 Anemia in chronic kidney disease: Secondary | ICD-10-CM | POA: Diagnosis not present

## 2021-10-16 DIAGNOSIS — Z992 Dependence on renal dialysis: Secondary | ICD-10-CM | POA: Diagnosis not present

## 2021-10-16 DIAGNOSIS — N2581 Secondary hyperparathyroidism of renal origin: Secondary | ICD-10-CM | POA: Diagnosis not present

## 2021-10-16 DIAGNOSIS — T8249XA Other complication of vascular dialysis catheter, initial encounter: Secondary | ICD-10-CM | POA: Diagnosis not present

## 2021-10-16 DIAGNOSIS — E1129 Type 2 diabetes mellitus with other diabetic kidney complication: Secondary | ICD-10-CM | POA: Diagnosis not present

## 2021-10-16 DIAGNOSIS — D689 Coagulation defect, unspecified: Secondary | ICD-10-CM | POA: Diagnosis not present

## 2021-10-17 DIAGNOSIS — Z1389 Encounter for screening for other disorder: Secondary | ICD-10-CM | POA: Diagnosis not present

## 2021-10-17 DIAGNOSIS — I10 Essential (primary) hypertension: Secondary | ICD-10-CM | POA: Diagnosis not present

## 2021-10-17 DIAGNOSIS — Z0000 Encounter for general adult medical examination without abnormal findings: Secondary | ICD-10-CM | POA: Diagnosis not present

## 2021-10-17 DIAGNOSIS — N186 End stage renal disease: Secondary | ICD-10-CM | POA: Diagnosis not present

## 2021-10-18 DIAGNOSIS — E1129 Type 2 diabetes mellitus with other diabetic kidney complication: Secondary | ICD-10-CM | POA: Diagnosis not present

## 2021-10-18 DIAGNOSIS — D631 Anemia in chronic kidney disease: Secondary | ICD-10-CM | POA: Diagnosis not present

## 2021-10-18 DIAGNOSIS — N2581 Secondary hyperparathyroidism of renal origin: Secondary | ICD-10-CM | POA: Diagnosis not present

## 2021-10-18 DIAGNOSIS — N186 End stage renal disease: Secondary | ICD-10-CM | POA: Diagnosis not present

## 2021-10-18 DIAGNOSIS — D689 Coagulation defect, unspecified: Secondary | ICD-10-CM | POA: Diagnosis not present

## 2021-10-18 DIAGNOSIS — E039 Hypothyroidism, unspecified: Secondary | ICD-10-CM | POA: Diagnosis not present

## 2021-10-18 DIAGNOSIS — Z992 Dependence on renal dialysis: Secondary | ICD-10-CM | POA: Diagnosis not present

## 2021-10-18 DIAGNOSIS — T8249XA Other complication of vascular dialysis catheter, initial encounter: Secondary | ICD-10-CM | POA: Diagnosis not present

## 2021-10-20 DIAGNOSIS — N2581 Secondary hyperparathyroidism of renal origin: Secondary | ICD-10-CM | POA: Diagnosis not present

## 2021-10-20 DIAGNOSIS — E039 Hypothyroidism, unspecified: Secondary | ICD-10-CM | POA: Diagnosis not present

## 2021-10-20 DIAGNOSIS — Z992 Dependence on renal dialysis: Secondary | ICD-10-CM | POA: Diagnosis not present

## 2021-10-20 DIAGNOSIS — T8249XA Other complication of vascular dialysis catheter, initial encounter: Secondary | ICD-10-CM | POA: Diagnosis not present

## 2021-10-20 DIAGNOSIS — N186 End stage renal disease: Secondary | ICD-10-CM | POA: Diagnosis not present

## 2021-10-20 DIAGNOSIS — D689 Coagulation defect, unspecified: Secondary | ICD-10-CM | POA: Diagnosis not present

## 2021-10-20 DIAGNOSIS — E1129 Type 2 diabetes mellitus with other diabetic kidney complication: Secondary | ICD-10-CM | POA: Diagnosis not present

## 2021-10-20 DIAGNOSIS — D631 Anemia in chronic kidney disease: Secondary | ICD-10-CM | POA: Diagnosis not present

## 2021-10-23 DIAGNOSIS — Z992 Dependence on renal dialysis: Secondary | ICD-10-CM | POA: Diagnosis not present

## 2021-10-23 DIAGNOSIS — E1129 Type 2 diabetes mellitus with other diabetic kidney complication: Secondary | ICD-10-CM | POA: Diagnosis not present

## 2021-10-23 DIAGNOSIS — N2581 Secondary hyperparathyroidism of renal origin: Secondary | ICD-10-CM | POA: Diagnosis not present

## 2021-10-23 DIAGNOSIS — D631 Anemia in chronic kidney disease: Secondary | ICD-10-CM | POA: Diagnosis not present

## 2021-10-23 DIAGNOSIS — N186 End stage renal disease: Secondary | ICD-10-CM | POA: Diagnosis not present

## 2021-10-23 DIAGNOSIS — T8612 Kidney transplant failure: Secondary | ICD-10-CM | POA: Diagnosis not present

## 2021-10-23 DIAGNOSIS — T8249XA Other complication of vascular dialysis catheter, initial encounter: Secondary | ICD-10-CM | POA: Diagnosis not present

## 2021-10-23 DIAGNOSIS — E039 Hypothyroidism, unspecified: Secondary | ICD-10-CM | POA: Diagnosis not present

## 2021-10-23 DIAGNOSIS — D689 Coagulation defect, unspecified: Secondary | ICD-10-CM | POA: Diagnosis not present

## 2021-10-24 ENCOUNTER — Other Ambulatory Visit: Payer: Self-pay

## 2021-10-24 ENCOUNTER — Encounter (HOSPITAL_COMMUNITY): Payer: Self-pay | Admitting: Vascular Surgery

## 2021-10-24 NOTE — Progress Notes (Signed)
PCP - Dr. Cipriano Mile  Cardiologist - Denies  EP- Denies  Endocrine- Denies  Pulm- Denies  Chest x-ray - 08/08/21 (E)  EKG - 06/29/21 (E)  Stress Test - 08/25/15 (E)  ECHO - 09/09/21 (E)  Cardiac Cath - Denies  AICD- na PM- na LOOP- na  Dialysis- T-TH-Sat- pt had a full session on Tues. 1/31  Sleep Study - Yes- Positive CPAP - Denies  LABS- 10/26/21: I-Stat- 8  ASA- Denies  ERAS- No  HA1C- Denies  Anesthesia- No  Pt denies having chest pain, sob, or fever, but endorses a chronic dry cough during the pre-op phone call. All instructions explained to the pt, with a verbal understanding of the material including: as of today, stop taking all Aspirin (unless instructed by your doctor) and Other Aspirin containing products, Vitamins, Fish oils, and Herbal medications. Also stop all NSAIDS i.e. Advil, Ibuprofen, Motrin, Aleve, Anaprox, Naproxen, BC, Goody Powders, and all Supplements.  Pt also instructed to wear a mask and social distance if she goes out. The opportunity to ask questions was provided.   . Coronavirus Screening  Have you experienced the following symptoms:  Cough yes/no: No Fever (>100.26F)  yes/no: No Runny nose yes/no: No Sore throat yes/no: No Difficulty breathing/shortness of breath  yes/no: No  Have you or a family member traveled in the last 14 days and where? yes/no: No   If the patient indicates "YES" to the above questions, their PAT will be rescheduled to limit the exposure to others and, the surgeon will be notified. THE PATIENT WILL NEED TO BE ASYMPTOMATIC FOR 14 DAYS.   If the patient is not experiencing any of these symptoms, the PAT nurse will instruct them to NOT bring anyone with them to their appointment since they may have these symptoms or traveled as well.   Please remind your patients and families that hospital visitation restrictions are in effect and the importance of the restrictions.

## 2021-10-25 DIAGNOSIS — Z992 Dependence on renal dialysis: Secondary | ICD-10-CM | POA: Diagnosis not present

## 2021-10-25 DIAGNOSIS — N186 End stage renal disease: Secondary | ICD-10-CM | POA: Diagnosis not present

## 2021-10-25 DIAGNOSIS — N2581 Secondary hyperparathyroidism of renal origin: Secondary | ICD-10-CM | POA: Diagnosis not present

## 2021-10-25 DIAGNOSIS — D689 Coagulation defect, unspecified: Secondary | ICD-10-CM | POA: Diagnosis not present

## 2021-10-25 DIAGNOSIS — D631 Anemia in chronic kidney disease: Secondary | ICD-10-CM | POA: Diagnosis not present

## 2021-10-25 DIAGNOSIS — E785 Hyperlipidemia, unspecified: Secondary | ICD-10-CM | POA: Diagnosis not present

## 2021-10-25 DIAGNOSIS — E039 Hypothyroidism, unspecified: Secondary | ICD-10-CM | POA: Diagnosis not present

## 2021-10-25 DIAGNOSIS — T8249XA Other complication of vascular dialysis catheter, initial encounter: Secondary | ICD-10-CM | POA: Diagnosis not present

## 2021-10-26 ENCOUNTER — Other Ambulatory Visit: Payer: Self-pay

## 2021-10-26 ENCOUNTER — Ambulatory Visit (HOSPITAL_COMMUNITY): Payer: Medicare Other | Admitting: Certified Registered Nurse Anesthetist

## 2021-10-26 ENCOUNTER — Ambulatory Visit (HOSPITAL_COMMUNITY)
Admission: RE | Admit: 2021-10-26 | Discharge: 2021-10-26 | Disposition: A | Discharge status: Home / Self Care | Payer: Medicare Other | Attending: Vascular Surgery | Admitting: Vascular Surgery

## 2021-10-26 ENCOUNTER — Encounter (HOSPITAL_COMMUNITY)
Admission: RE | Disposition: A | Discharge status: Home / Self Care | Payer: Self-pay | Source: Home / Self Care | Attending: Vascular Surgery

## 2021-10-26 DIAGNOSIS — Z8673 Personal history of transient ischemic attack (TIA), and cerebral infarction without residual deficits: Secondary | ICD-10-CM | POA: Diagnosis not present

## 2021-10-26 DIAGNOSIS — I120 Hypertensive chronic kidney disease with stage 5 chronic kidney disease or end stage renal disease: Secondary | ICD-10-CM | POA: Diagnosis not present

## 2021-10-26 DIAGNOSIS — E039 Hypothyroidism, unspecified: Secondary | ICD-10-CM | POA: Insufficient documentation

## 2021-10-26 DIAGNOSIS — J45909 Unspecified asthma, uncomplicated: Secondary | ICD-10-CM | POA: Insufficient documentation

## 2021-10-26 DIAGNOSIS — K449 Diaphragmatic hernia without obstruction or gangrene: Secondary | ICD-10-CM | POA: Insufficient documentation

## 2021-10-26 DIAGNOSIS — N186 End stage renal disease: Secondary | ICD-10-CM | POA: Insufficient documentation

## 2021-10-26 DIAGNOSIS — E1122 Type 2 diabetes mellitus with diabetic chronic kidney disease: Secondary | ICD-10-CM | POA: Diagnosis not present

## 2021-10-26 DIAGNOSIS — Z992 Dependence on renal dialysis: Secondary | ICD-10-CM | POA: Diagnosis not present

## 2021-10-26 DIAGNOSIS — M1990 Unspecified osteoarthritis, unspecified site: Secondary | ICD-10-CM | POA: Diagnosis not present

## 2021-10-26 DIAGNOSIS — K219 Gastro-esophageal reflux disease without esophagitis: Secondary | ICD-10-CM | POA: Diagnosis not present

## 2021-10-26 DIAGNOSIS — G4730 Sleep apnea, unspecified: Secondary | ICD-10-CM | POA: Diagnosis not present

## 2021-10-26 HISTORY — PX: AV FISTULA PLACEMENT: SHX1204

## 2021-10-26 LAB — POCT I-STAT, CHEM 8
BUN: 41 mg/dL — ABNORMAL HIGH (ref 8–23)
Calcium, Ion: 1.12 mmol/L — ABNORMAL LOW (ref 1.15–1.40)
Chloride: 100 mmol/L (ref 98–111)
Creatinine, Ser: 6.2 mg/dL — ABNORMAL HIGH (ref 0.44–1.00)
Glucose, Bld: 79 mg/dL (ref 70–99)
HCT: 36 % (ref 36.0–46.0)
Hemoglobin: 12.2 g/dL (ref 12.0–15.0)
Potassium: 4.7 mmol/L (ref 3.5–5.1)
Sodium: 136 mmol/L (ref 135–145)
TCO2: 30 mmol/L (ref 22–32)

## 2021-10-26 SURGERY — INSERTION OF ARTERIOVENOUS (AV) GORE-TEX GRAFT ARM
Anesthesia: Monitor Anesthesia Care | Laterality: Right

## 2021-10-26 MED ORDER — CHLORHEXIDINE GLUCONATE 4 % EX LIQD
60.0000 mL | Freq: Once | CUTANEOUS | Status: DC
Start: 2021-10-26 — End: 2021-10-26

## 2021-10-26 MED ORDER — FENTANYL CITRATE (PF) 250 MCG/5ML IJ SOLN
INTRAMUSCULAR | Status: DC | PRN
Start: 2021-10-26 — End: 2021-10-26
  Administered 2021-10-26: 50 ug via INTRAVENOUS

## 2021-10-26 MED ORDER — LIDOCAINE-EPINEPHRINE (PF) 1 %-1:200000 IJ SOLN
INTRAMUSCULAR | Status: AC
  Filled 2021-10-26: qty 30

## 2021-10-26 MED ORDER — SODIUM CHLORIDE 0.9 % IV SOLN: INTRAVENOUS | Status: DC

## 2021-10-26 MED ORDER — FENTANYL CITRATE (PF) 250 MCG/5ML IJ SOLN
INTRAMUSCULAR | Status: AC
  Filled 2021-10-26: qty 5

## 2021-10-26 MED ORDER — HYDROCODONE-ACETAMINOPHEN 5-325 MG PO TABS: 1.0000 | ORAL_TABLET | Freq: Four times a day (QID) | ORAL | 0 refills | Status: DC | PRN

## 2021-10-26 MED ORDER — 0.9 % SODIUM CHLORIDE (POUR BTL) OPTIME
TOPICAL | Status: DC | PRN
  Administered 2021-10-26: 1000 mL

## 2021-10-26 MED ORDER — EPHEDRINE SULFATE-NACL 50-0.9 MG/10ML-% IV SOSY
PREFILLED_SYRINGE | INTRAVENOUS | Status: DC | PRN
  Administered 2021-10-26 (×2): 2.5 mg via INTRAVENOUS

## 2021-10-26 MED ORDER — ONDANSETRON HCL 4 MG/2ML IJ SOLN
INTRAMUSCULAR | Status: DC | PRN
  Administered 2021-10-26: 4 mg via INTRAVENOUS

## 2021-10-26 MED ORDER — PROPOFOL 10 MG/ML IV BOLUS
INTRAVENOUS | Status: AC
  Filled 2021-10-26: qty 20

## 2021-10-26 MED ORDER — LIDOCAINE-EPINEPHRINE (PF) 1 %-1:200000 IJ SOLN
INTRAMUSCULAR | Status: DC | PRN
  Administered 2021-10-26: 26 mL

## 2021-10-26 MED ORDER — CEFAZOLIN SODIUM-DEXTROSE 2-4 GM/100ML-% IV SOLN
2.0000 g | INTRAVENOUS | Status: AC
  Administered 2021-10-26: 2 g via INTRAVENOUS
  Filled 2021-10-26: qty 100

## 2021-10-26 MED ORDER — CHLORHEXIDINE GLUCONATE 0.12 % MT SOLN: 15.0000 mL | Freq: Once | OROMUCOSAL | Status: DC

## 2021-10-26 MED ORDER — ONDANSETRON HCL 4 MG/2ML IJ SOLN: 4.0000 mg | Freq: Four times a day (QID) | INTRAMUSCULAR | Status: DC | PRN

## 2021-10-26 MED ORDER — OXYCODONE HCL 5 MG/5ML PO SOLN: 5.0000 mg | Freq: Once | ORAL | Status: DC | PRN

## 2021-10-26 MED ORDER — MIDAZOLAM HCL 2 MG/2ML IJ SOLN
INTRAMUSCULAR | Status: AC
  Filled 2021-10-26: qty 2

## 2021-10-26 MED ORDER — PHENYLEPHRINE 40 MCG/ML (10ML) SYRINGE FOR IV PUSH (FOR BLOOD PRESSURE SUPPORT)
PREFILLED_SYRINGE | INTRAVENOUS | Status: DC | PRN
  Administered 2021-10-26 (×2): 40 ug via INTRAVENOUS

## 2021-10-26 MED ORDER — PROPOFOL 10 MG/ML IV BOLUS: INTRAVENOUS | Status: DC | PRN

## 2021-10-26 MED ORDER — CHLORHEXIDINE GLUCONATE 4 % EX LIQD: 60.0000 mL | Freq: Once | CUTANEOUS | Status: DC

## 2021-10-26 MED ORDER — MIDAZOLAM HCL 5 MG/5ML IJ SOLN
INTRAMUSCULAR | Status: DC | PRN
Start: 2021-10-26 — End: 2021-10-26
  Administered 2021-10-26 (×3): .5 mg via INTRAVENOUS

## 2021-10-26 MED ORDER — FENTANYL CITRATE (PF) 100 MCG/2ML IJ SOLN: 25.0000 ug | INTRAMUSCULAR | Status: DC | PRN

## 2021-10-26 MED ORDER — LIDOCAINE HCL (PF) 1 % IJ SOLN
INTRAMUSCULAR | Status: AC
  Filled 2021-10-26: qty 30

## 2021-10-26 MED ORDER — PROPOFOL 10 MG/ML IV BOLUS
INTRAVENOUS | Status: DC | PRN
  Administered 2021-10-26: 50 ug/kg/min via INTRAVENOUS

## 2021-10-26 MED ORDER — ROPIVACAINE HCL 5 MG/ML IJ SOLN
INTRAMUSCULAR | Status: DC | PRN
Start: 2021-10-26 — End: 2021-10-26
  Administered 2021-10-26: 20 mL via PERINEURAL

## 2021-10-26 MED ORDER — HEPARIN 6000 UNIT IRRIGATION SOLUTION
Status: DC | PRN
  Administered 2021-10-26: 1

## 2021-10-26 MED ORDER — OXYCODONE HCL 5 MG PO TABS: 5.0000 mg | ORAL_TABLET | Freq: Once | ORAL | Status: DC | PRN

## 2021-10-26 MED ORDER — HEPARIN 6000 UNIT IRRIGATION SOLUTION
Status: AC
  Filled 2021-10-26: qty 500

## 2021-10-26 MED ORDER — ORAL CARE MOUTH RINSE: 15.0000 mL | Freq: Once | OROMUCOSAL | Status: DC

## 2021-10-26 SURGICAL SUPPLY — 35 items
ADH SKN CLS APL DERMABOND .7 (GAUZE/BANDAGES/DRESSINGS) ×1
ARMBAND PINK RESTRICT EXTREMIT (MISCELLANEOUS) ×4 IMPLANT
BAG COUNTER SPONGE SURGICOUNT (BAG) ×2 IMPLANT
BAG SPNG CNTER NS LX DISP (BAG) ×1
CANISTER SUCT 3000ML PPV (MISCELLANEOUS) ×2 IMPLANT
CLIP LIGATING EXTRA MED SLVR (CLIP) ×3 IMPLANT
CLIP LIGATING EXTRA SM BLUE (MISCELLANEOUS) ×2 IMPLANT
DERMABOND ADVANCED (GAUZE/BANDAGES/DRESSINGS) ×1
DERMABOND ADVANCED .7 DNX12 (GAUZE/BANDAGES/DRESSINGS) ×1 IMPLANT
ELECT REM PT RETURN 9FT ADLT (ELECTROSURGICAL) ×2
ELECTRODE REM PT RTRN 9FT ADLT (ELECTROSURGICAL) ×1 IMPLANT
GLOVE SURG ENC MOIS LTX SZ7.5 (GLOVE) ×4 IMPLANT
GLOVE SURG POLYISO LF SZ6.5 (GLOVE) ×2 IMPLANT
GOWN STRL REUS W/ TWL LRG LVL3 (GOWN DISPOSABLE) ×2 IMPLANT
GOWN STRL REUS W/ TWL XL LVL3 (GOWN DISPOSABLE) ×1 IMPLANT
GOWN STRL REUS W/TWL LRG LVL3 (GOWN DISPOSABLE) ×6
GOWN STRL REUS W/TWL XL LVL3 (GOWN DISPOSABLE) ×4
GRAFT GORETEX STRT 4-7X45 (Vascular Products) ×1 IMPLANT
HEMOSTAT SNOW SURGICEL 2X4 (HEMOSTASIS) IMPLANT
INSERT FOGARTY SM (MISCELLANEOUS) ×2 IMPLANT
KIT BASIN OR (CUSTOM PROCEDURE TRAY) ×2 IMPLANT
KIT TURNOVER KIT B (KITS) ×3 IMPLANT
NS IRRIG 1000ML POUR BTL (IV SOLUTION) ×3 IMPLANT
PACK CV ACCESS (CUSTOM PROCEDURE TRAY) ×3 IMPLANT
PAD ARMBOARD 7.5X6 YLW CONV (MISCELLANEOUS) ×4 IMPLANT
SPONGE T-LAP 18X18 ~~LOC~~+RFID (SPONGE) ×1 IMPLANT
SUT MNCRL AB 4-0 PS2 18 (SUTURE) IMPLANT
SUT PROLENE 6 0 BV (SUTURE) IMPLANT
SUT SILK 2 0 SH (SUTURE) IMPLANT
SUT VIC AB 3-0 SH 27 (SUTURE) ×4
SUT VIC AB 3-0 SH 27X BRD (SUTURE) ×4 IMPLANT
SYR TOOMEY 50ML (SYRINGE) IMPLANT
TOWEL GREEN STERILE (TOWEL DISPOSABLE) ×3 IMPLANT
UNDERPAD 30X36 HEAVY ABSORB (UNDERPADS AND DIAPERS) ×2 IMPLANT
WATER STERILE IRR 1000ML POUR (IV SOLUTION) ×2 IMPLANT

## 2021-10-26 NOTE — Interval H&P Note (Signed)
History and Physical Interval Note:  10/26/2021 7:19 AM  [place]  has presented today for surgery, with the diagnosis of ESRD.  The various methods of treatment have been discussed with the patient and family. After consideration of risks, benefits and other options for treatment, the patient has consented to  Procedure(s): INSERTION OF RIGHT ARM ARTERIOVENOUS (AV) GORE-TEX GRAFT (Right) as a surgical intervention.  The patient's history has been reviewed, patient examined, no change in status, stable for surgery.  I have reviewed the patient's chart and labs.  Questions were answered to the patient's satisfaction.     Kimberly [name]

## 2021-10-26 NOTE — Transfer of Care (Signed)
Immediate Anesthesia Transfer of Care Note  Patient: Kimberly [name]  Procedure(s) Performed: INSERTION OF RIGHT ARM ARTERIOVENOUS (AV) GORE-TEX GRAFT (Right)  Patient Location: PACU  Anesthesia Type:MAC  Level of Consciousness: awake, alert , oriented, patient cooperative and responds to stimulation  Airway & Oxygen Therapy: Patient Spontanous Breathing and Patient connected to face mask oxygen  Post-op Assessment: Report given to RN and Post -op Vital signs reviewed and stable  Post vital signs: Reviewed and stable  Last Vitals:  Vitals Value Taken Time  BP 150/58 10/26/21 0839  Temp    Pulse 67 10/26/21 0840  Resp 20 10/26/21 0840  SpO2 100 % 10/26/21 0840  Vitals shown include unvalidated device data.  Last Pain:  Vitals:   10/26/21 0603  TempSrc: Oral  PainSc: 0-No pain      Patients Stated Pain Goal: 2 (19/47/12 5271)  Complications: No notable events documented.

## 2021-10-26 NOTE — Anesthesia Preprocedure Evaluation (Signed)
Anesthesia Evaluation  Patient identified by MRN, date of birth, ID band Patient awake    Reviewed: Allergy & Precautions, H&P , NPO status , Patient's Chart, lab work & pertinent test results  Airway Mallampati: II   Neck ROM: full    Dental   Pulmonary shortness of breath, asthma , sleep apnea ,    breath sounds clear to auscultation       Cardiovascular hypertension,  Rhythm:regular Rate:Normal     Neuro/Psych CVA    GI/Hepatic hiatal hernia, GERD  ,  Endo/Other  diabetes, Type 2Hypothyroidism   Renal/GU ESRF and DialysisRenal disease     Musculoskeletal  (+) Arthritis ,   Abdominal   Peds  Hematology   Anesthesia Other Findings   Reproductive/Obstetrics                             Anesthesia Physical Anesthesia Plan  ASA: 3  Anesthesia Plan: MAC and Regional   Post-op Pain Management:    Induction: Intravenous  PONV Risk Score and Plan: 2 and Ondansetron, Propofol infusion and Treatment may vary due to age or medical condition  Airway Management Planned: Simple Face Mask  Additional Equipment:   Intra-op Plan:   Post-operative Plan:   Informed Consent: I have reviewed the patients History and Physical, chart, labs and discussed the procedure including the risks, benefits and alternatives for the proposed anesthesia with the patient or authorized representative who has indicated his/her understanding and acceptance.     Dental advisory given  Plan Discussed with: CRNA, Anesthesiologist and Surgeon  Anesthesia Plan Comments:         Anesthesia Quick Evaluation

## 2021-10-26 NOTE — Anesthesia Procedure Notes (Signed)
Anesthesia Regional Block: Supraclavicular block   Pre-Anesthetic Checklist: , timeout performed,  Correct Patient, Correct Site, Correct Laterality,  Correct Procedure, Correct Position, site marked,  Risks and benefits discussed,  Surgical consent,  Pre-op evaluation,  At surgeon's request and post-op pain management  Laterality: Right  Prep: chloraprep       Needles:  Injection technique: Single-shot  Needle Type: Echogenic Stimulator Needle     Needle Length: 5cm  Needle Gauge: 22     Additional Needles:   Procedures:, nerve stimulator,,,,,     Nerve Stimulator or Paresthesia:  Response: biceps flexion, 0.45 mA  Additional Responses:   Narrative:  Start time: 10/26/2021 7:18 AM End time: 10/26/2021 7:25 AM Injection made incrementally with aspirations every 5 mL.  Performed by: Personally  Anesthesiologist: Albertha Ghee, MD  Additional Notes: Functioning IV was confirmed and monitors were applied.  A 108mm 22ga Arrow echogenic stimulator needle was used. Sterile prep and drape,hand hygiene and sterile gloves were used.  Negative aspiration and negative test dose prior to incremental administration of local anesthetic. The patient tolerated the procedure well.  Ultrasound guidance: relevent anatomy identified, needle position confirmed, local anesthetic spread visualized around nerve(s), vascular puncture avoided.  Image printed for medical record.

## 2021-10-26 NOTE — Anesthesia Postprocedure Evaluation (Signed)
Anesthesia Post Note  Patient: Kimberly [name]  Procedure(s) Performed: INSERTION OF RIGHT ARM ARTERIOVENOUS (AV) GORE-TEX GRAFT (Right)     Patient location during evaluation: PACU Anesthesia Type: Regional and MAC Level of consciousness: awake and alert Pain management: pain level controlled Vital Signs Assessment: post-procedure vital signs reviewed and stable Respiratory status: spontaneous breathing, nonlabored ventilation, respiratory function stable and patient connected to nasal cannula oxygen Cardiovascular status: stable and blood pressure returned to baseline Postop Assessment: no apparent nausea or vomiting Anesthetic complications: no   No notable events documented.  Last Vitals:  Vitals:   10/26/21 0854 10/26/21 0909  BP: (!) 151/60 (!) 150/55  Pulse: 62 63  Resp: 16 14  Temp:  (!) 36.1 C  SpO2: 96% 95%    Last Pain:  Vitals:   10/26/21 0909  TempSrc:   PainSc: 0-No pain                 Rontrell Moquin S

## 2021-10-26 NOTE — Op Note (Addendum)
° ° °  Patient name: Kimberly [name] MRN: [id number] DOB: 1955/10/24 Sex: female  10/26/2021 Pre-operative Diagnosis: esrd Post-operative diagnosis:  Same Surgeon:  Erlene Quan C. Donzetta Matters, MD. Assistant: Arlee Muslim, [place] Procedure Performed: Right brachial artery to axillary vein graft with 4-7 mm PTFE  Indications: 66 year old female with end-stage renal disease currently dialyzes via catheter.  She has a previous failed left upper arm AV graft that had ulceration but is now healed.  She is indicated for right upper extremity access and does not appear to have suitable vein for fistula creation.  An experienced assistant was required given the complexity of this procedure and the standard of surgical care.  He helped with exposure using counter tension and suctioning. He expedited the anastomosis by following the suture.  Findings: Brachial artery was actually quite large and free of disease measuring 3 mm.  The axillary vein was transected and the graft was sewn in an end to end fashion.  At completion there was good flow through the axillary vein and a radial artery signal at the wrist.   Procedure:  The patient was identified in the holding area and taken to the operating room where she was put supine operative table and MAC anesthesia induced.  A preoperative block of been placed.  Timeout was called and antibiotics were administered.  The block was checked was noted to be intact distally but in the axilla she was not numb.  Proximally 20 cc of 1% lidocaine with epinephrine was injected there.  Transverse incisions were made above the antecubitum in the axilla.  Bovie indicated we dissected the artery placed a vessel loop around this.  The assistant did help with exposure of the vessels.  In the axilla we dissected down to the vein this was a paired system.  I elected to transect the vein.  Was marked for orientation clamped distally proximally transected.  We flushed with heparinized saline and reclamped  and tied off proximally.  We then tunneled a 4 to 7 mm graft and the assistant helped with this.  It was trimmed to size in the axilla and sewn end-to-end with 6-0 Prolene suture.  Again the assistant helped with some of the anastomosis.  Upon completion we flushed through the graft itself and reclamped the graft.  The artery was then clamped distally proximally opened longitudinally flushed with heparinized saline in both directions.  The graft was trimmed to size and sewn end to side with 6-0 Prolene suture with help from the assistant following the suture.  Prior completion of flushing all directions.  Upon completion there was good flow in the axillary vein by Doppler and flow in the radial artery by Doppler.  The wounds were irrigated closed in layers with Vicryl and Monocryl.  Dermabond is placed at the skin level.  She was awakened from anesthesia having tolerated procedure well without any complication.  All counts were correct at completion.  EBL: 25cc   Amirra Herling C. Donzetta Matters, MD [facility] [place] Office: [phone] Pager: [phone]

## 2021-10-26 NOTE — Discharge Instructions (Signed)
° °  [facility] [place] ° °Discharge Instructions ° °AV Fistula or Graft Surgery for Dialysis Access ° °Please refer to the following instructions for your post-procedure care. Your surgeon or physician assistant will discuss any changes with you. ° °Activity ° °You may drive the day following your surgery, if you are comfortable and no longer taking prescription pain medication. Resume full activity as the soreness in your incision resolves. ° °Bathing/Showering ° °You may shower after you go home. Keep your incision dry for 48 hours. Do not soak in a bathtub, hot tub, or swim until the incision heals completely. You may not shower if you have a hemodialysis catheter. ° °Incision Care ° °Clean your incision with mild soap and water after 48 hours. Pat the area dry with a clean towel. You do not need a bandage unless otherwise instructed. Do not apply any ointments or creams to your incision. You may have skin glue on your incision. Do not peel it off. It will come off on its own in about one week. Your arm may swell a bit after surgery. To reduce swelling use pillows to elevate your arm so it is above your heart. Your doctor will tell you if you need to lightly wrap your arm with an ACE bandage. ° °Diet ° °Resume your normal diet. There are not special food restrictions following this procedure. In order to heal from your surgery, it is CRITICAL to get adequate nutrition. Your body requires vitamins, minerals, and protein. Vegetables are the best source of vitamins and minerals. Vegetables also provide the perfect balance of protein. Processed food has little nutritional value, so try to avoid this. ° °Medications ° °Resume taking all of your medications. If your incision is causing pain, you may take over-the counter pain relievers such as acetaminophen (Tylenol). If you were prescribed a stronger pain medication, please be aware these medications can cause nausea and constipation. Prevent  nausea by taking the medication with a snack or meal. Avoid constipation by drinking plenty of fluids and eating foods with high amount of fiber, such as fruits, vegetables, and grains. Do not take Tylenol if you are taking prescription pain medications. ° ° ° ° °Follow up °Your surgeon may want to see you in the office following your access surgery. If so, this will be arranged at the time of your surgery. ° °Please call us immediately for any of the following conditions: ° °Increased pain, redness, drainage (pus) from your incision site °Fever of 101 degrees or higher °Severe or worsening pain at your incision site °Hand pain or numbness. ° °Reduce your risk of vascular disease: ° °Stop smoking. If you would like help, call [facility] at 1-800-QUIT-NOW (1-800-784-8669) or [facility] at 336-586-4000 ° °Manage your cholesterol °Maintain a desired weight °Control your diabetes °Keep your blood pressure down ° °Dialysis ° °It will take several weeks to several months for your new dialysis access to be ready for use. Your surgeon will determine when it is OK to use it. Your nephrologist will continue to direct your dialysis. You can continue to use your Permcath until your new access is ready for use. ° °If you have any questions, please call the office at [phone]. ° °

## 2021-10-27 DIAGNOSIS — E785 Hyperlipidemia, unspecified: Secondary | ICD-10-CM | POA: Diagnosis not present

## 2021-10-27 DIAGNOSIS — N186 End stage renal disease: Secondary | ICD-10-CM | POA: Diagnosis not present

## 2021-10-27 DIAGNOSIS — D689 Coagulation defect, unspecified: Secondary | ICD-10-CM | POA: Diagnosis not present

## 2021-10-27 DIAGNOSIS — T8249XA Other complication of vascular dialysis catheter, initial encounter: Secondary | ICD-10-CM | POA: Diagnosis not present

## 2021-10-27 DIAGNOSIS — E039 Hypothyroidism, unspecified: Secondary | ICD-10-CM | POA: Diagnosis not present

## 2021-10-27 DIAGNOSIS — Z992 Dependence on renal dialysis: Secondary | ICD-10-CM | POA: Diagnosis not present

## 2021-10-27 DIAGNOSIS — N2581 Secondary hyperparathyroidism of renal origin: Secondary | ICD-10-CM | POA: Diagnosis not present

## 2021-10-27 DIAGNOSIS — D631 Anemia in chronic kidney disease: Secondary | ICD-10-CM | POA: Diagnosis not present

## 2021-10-29 ENCOUNTER — Encounter (HOSPITAL_COMMUNITY): Payer: Self-pay | Admitting: Vascular Surgery

## 2021-10-30 ENCOUNTER — Telehealth: Payer: Self-pay

## 2021-10-30 DIAGNOSIS — N186 End stage renal disease: Secondary | ICD-10-CM | POA: Diagnosis not present

## 2021-10-30 DIAGNOSIS — N2581 Secondary hyperparathyroidism of renal origin: Secondary | ICD-10-CM | POA: Diagnosis not present

## 2021-10-30 DIAGNOSIS — D631 Anemia in chronic kidney disease: Secondary | ICD-10-CM | POA: Diagnosis not present

## 2021-10-30 DIAGNOSIS — E785 Hyperlipidemia, unspecified: Secondary | ICD-10-CM | POA: Diagnosis not present

## 2021-10-30 DIAGNOSIS — T8249XA Other complication of vascular dialysis catheter, initial encounter: Secondary | ICD-10-CM | POA: Diagnosis not present

## 2021-10-30 DIAGNOSIS — E039 Hypothyroidism, unspecified: Secondary | ICD-10-CM | POA: Diagnosis not present

## 2021-10-30 DIAGNOSIS — D689 Coagulation defect, unspecified: Secondary | ICD-10-CM | POA: Diagnosis not present

## 2021-10-30 DIAGNOSIS — Z992 Dependence on renal dialysis: Secondary | ICD-10-CM | POA: Diagnosis not present

## 2021-10-30 NOTE — Telephone Encounter (Signed)
Pt called with c/o fever of 102.1 s/p AVG on 10/26/21. She feels "malaise", no other symptoms. She looked at her incision site on the phone with me and said there is some "yellow white" drainage, minimal swelling, not painful. She is taking fever reducing medication and has been given an appt tomorrow morning. She was advised to report to ED tonight if symptoms worsen.

## 2021-10-31 ENCOUNTER — Ambulatory Visit (INDEPENDENT_AMBULATORY_CARE_PROVIDER_SITE_OTHER): Payer: Medicare Other | Admitting: Vascular Surgery

## 2021-10-31 ENCOUNTER — Encounter: Payer: Self-pay | Admitting: Vascular Surgery

## 2021-10-31 ENCOUNTER — Other Ambulatory Visit: Payer: Self-pay

## 2021-10-31 VITALS — BP 138/67 | HR 80 | Temp 98.2°F | Resp 20 | Ht 65.0 in | Wt 112.0 lb

## 2021-10-31 DIAGNOSIS — Z992 Dependence on renal dialysis: Secondary | ICD-10-CM

## 2021-10-31 DIAGNOSIS — N186 End stage renal disease: Secondary | ICD-10-CM

## 2021-10-31 NOTE — Progress Notes (Signed)
°  ° °  Subjective:     Patient ID: Kimberly [name], female   DOB: [birth year], 66 y.o.   MRN: [id number]  HPI 65 year old female with end-stage renal disease on dialysis via catheter.  She recently underwent right upper extremity graft placement.  She states that she has had fevers for several days afterwards up to 102.  She also feels quite lethargic.  Fevers resolved prior to today's visit.  She thought she had some drainage from the antecubital incision but this has resolved as well.   Review of Systems Fevers and lethargy    Objective:   Physical Exam Vitals:   10/31/21 1057  BP: 138/67  Pulse: 80  Resp: 20  Temp: 98.2 F (36.8 C)  SpO2: 92%   Awake alert oriented Nonlabored aspirations Right upper extremity graft with strong thrill Palpable right radial pulse and hand is sensorimotor intact There is some rubor of the right upper extremity no blanching erythema no drainage and incisions are clean dry intact     Assessment:     66 year old female status post right upper extremity graft placement.  She continues dialysis via catheter.  She has unfortunately had fevers after recent placement of graft there is some concern that this could be from the catheter or graft.    Plan:     I have called her dialysis unit to get her started on antibiotics.  Hopefully this will resolve the issues and she can follow-up with me as needed.  Certainly if she has continued concerns for infection we will need to consider removing the graft.     Kimberly [name] C. Donzetta Matters, MD [facility] Office: [phone] Pager: [phone]

## 2021-11-01 DIAGNOSIS — E039 Hypothyroidism, unspecified: Secondary | ICD-10-CM | POA: Diagnosis not present

## 2021-11-01 DIAGNOSIS — E785 Hyperlipidemia, unspecified: Secondary | ICD-10-CM | POA: Diagnosis not present

## 2021-11-01 DIAGNOSIS — D631 Anemia in chronic kidney disease: Secondary | ICD-10-CM | POA: Diagnosis not present

## 2021-11-01 DIAGNOSIS — N2581 Secondary hyperparathyroidism of renal origin: Secondary | ICD-10-CM | POA: Diagnosis not present

## 2021-11-01 DIAGNOSIS — N186 End stage renal disease: Secondary | ICD-10-CM | POA: Diagnosis not present

## 2021-11-01 DIAGNOSIS — D689 Coagulation defect, unspecified: Secondary | ICD-10-CM | POA: Diagnosis not present

## 2021-11-01 DIAGNOSIS — T8249XA Other complication of vascular dialysis catheter, initial encounter: Secondary | ICD-10-CM | POA: Diagnosis not present

## 2021-11-01 DIAGNOSIS — Z992 Dependence on renal dialysis: Secondary | ICD-10-CM | POA: Diagnosis not present

## 2021-11-02 DIAGNOSIS — H2513 Age-related nuclear cataract, bilateral: Secondary | ICD-10-CM | POA: Diagnosis not present

## 2021-11-02 DIAGNOSIS — Z79899 Other long term (current) drug therapy: Secondary | ICD-10-CM | POA: Diagnosis not present

## 2021-11-03 DIAGNOSIS — D689 Coagulation defect, unspecified: Secondary | ICD-10-CM | POA: Diagnosis not present

## 2021-11-03 DIAGNOSIS — E785 Hyperlipidemia, unspecified: Secondary | ICD-10-CM | POA: Diagnosis not present

## 2021-11-03 DIAGNOSIS — Z992 Dependence on renal dialysis: Secondary | ICD-10-CM | POA: Diagnosis not present

## 2021-11-03 DIAGNOSIS — E039 Hypothyroidism, unspecified: Secondary | ICD-10-CM | POA: Diagnosis not present

## 2021-11-03 DIAGNOSIS — N2581 Secondary hyperparathyroidism of renal origin: Secondary | ICD-10-CM | POA: Diagnosis not present

## 2021-11-03 DIAGNOSIS — T8249XA Other complication of vascular dialysis catheter, initial encounter: Secondary | ICD-10-CM | POA: Diagnosis not present

## 2021-11-03 DIAGNOSIS — D631 Anemia in chronic kidney disease: Secondary | ICD-10-CM | POA: Diagnosis not present

## 2021-11-03 DIAGNOSIS — N186 End stage renal disease: Secondary | ICD-10-CM | POA: Diagnosis not present

## 2021-11-06 DIAGNOSIS — D689 Coagulation defect, unspecified: Secondary | ICD-10-CM | POA: Diagnosis not present

## 2021-11-06 DIAGNOSIS — N2581 Secondary hyperparathyroidism of renal origin: Secondary | ICD-10-CM | POA: Diagnosis not present

## 2021-11-06 DIAGNOSIS — Z992 Dependence on renal dialysis: Secondary | ICD-10-CM | POA: Diagnosis not present

## 2021-11-06 DIAGNOSIS — N186 End stage renal disease: Secondary | ICD-10-CM | POA: Diagnosis not present

## 2021-11-06 DIAGNOSIS — E785 Hyperlipidemia, unspecified: Secondary | ICD-10-CM | POA: Diagnosis not present

## 2021-11-06 DIAGNOSIS — D631 Anemia in chronic kidney disease: Secondary | ICD-10-CM | POA: Diagnosis not present

## 2021-11-06 DIAGNOSIS — E039 Hypothyroidism, unspecified: Secondary | ICD-10-CM | POA: Diagnosis not present

## 2021-11-06 DIAGNOSIS — T8249XA Other complication of vascular dialysis catheter, initial encounter: Secondary | ICD-10-CM | POA: Diagnosis not present

## 2021-11-07 DIAGNOSIS — N186 End stage renal disease: Secondary | ICD-10-CM | POA: Diagnosis not present

## 2021-11-07 DIAGNOSIS — E340 Carcinoid syndrome: Secondary | ICD-10-CM | POA: Diagnosis not present

## 2021-11-07 DIAGNOSIS — M0579 Rheumatoid arthritis with rheumatoid factor of multiple sites without organ or systems involvement: Secondary | ICD-10-CM | POA: Diagnosis not present

## 2021-11-07 DIAGNOSIS — M2550 Pain in unspecified joint: Secondary | ICD-10-CM | POA: Diagnosis not present

## 2021-11-08 DIAGNOSIS — E039 Hypothyroidism, unspecified: Secondary | ICD-10-CM | POA: Diagnosis not present

## 2021-11-08 DIAGNOSIS — N2581 Secondary hyperparathyroidism of renal origin: Secondary | ICD-10-CM | POA: Diagnosis not present

## 2021-11-08 DIAGNOSIS — N186 End stage renal disease: Secondary | ICD-10-CM | POA: Diagnosis not present

## 2021-11-08 DIAGNOSIS — T8249XA Other complication of vascular dialysis catheter, initial encounter: Secondary | ICD-10-CM | POA: Diagnosis not present

## 2021-11-08 DIAGNOSIS — E785 Hyperlipidemia, unspecified: Secondary | ICD-10-CM | POA: Diagnosis not present

## 2021-11-08 DIAGNOSIS — Z992 Dependence on renal dialysis: Secondary | ICD-10-CM | POA: Diagnosis not present

## 2021-11-08 DIAGNOSIS — D631 Anemia in chronic kidney disease: Secondary | ICD-10-CM | POA: Diagnosis not present

## 2021-11-08 DIAGNOSIS — D689 Coagulation defect, unspecified: Secondary | ICD-10-CM | POA: Diagnosis not present

## 2021-11-10 DIAGNOSIS — Z992 Dependence on renal dialysis: Secondary | ICD-10-CM | POA: Diagnosis not present

## 2021-11-10 DIAGNOSIS — D689 Coagulation defect, unspecified: Secondary | ICD-10-CM | POA: Diagnosis not present

## 2021-11-10 DIAGNOSIS — N2581 Secondary hyperparathyroidism of renal origin: Secondary | ICD-10-CM | POA: Diagnosis not present

## 2021-11-10 DIAGNOSIS — E785 Hyperlipidemia, unspecified: Secondary | ICD-10-CM | POA: Diagnosis not present

## 2021-11-10 DIAGNOSIS — E039 Hypothyroidism, unspecified: Secondary | ICD-10-CM | POA: Diagnosis not present

## 2021-11-10 DIAGNOSIS — N186 End stage renal disease: Secondary | ICD-10-CM | POA: Diagnosis not present

## 2021-11-10 DIAGNOSIS — D631 Anemia in chronic kidney disease: Secondary | ICD-10-CM | POA: Diagnosis not present

## 2021-11-10 DIAGNOSIS — T8249XA Other complication of vascular dialysis catheter, initial encounter: Secondary | ICD-10-CM | POA: Diagnosis not present

## 2021-11-13 DIAGNOSIS — N2581 Secondary hyperparathyroidism of renal origin: Secondary | ICD-10-CM | POA: Diagnosis not present

## 2021-11-13 DIAGNOSIS — E039 Hypothyroidism, unspecified: Secondary | ICD-10-CM | POA: Diagnosis not present

## 2021-11-13 DIAGNOSIS — D631 Anemia in chronic kidney disease: Secondary | ICD-10-CM | POA: Diagnosis not present

## 2021-11-13 DIAGNOSIS — T8249XA Other complication of vascular dialysis catheter, initial encounter: Secondary | ICD-10-CM | POA: Diagnosis not present

## 2021-11-13 DIAGNOSIS — N186 End stage renal disease: Secondary | ICD-10-CM | POA: Diagnosis not present

## 2021-11-13 DIAGNOSIS — Z992 Dependence on renal dialysis: Secondary | ICD-10-CM | POA: Diagnosis not present

## 2021-11-13 DIAGNOSIS — E785 Hyperlipidemia, unspecified: Secondary | ICD-10-CM | POA: Diagnosis not present

## 2021-11-13 DIAGNOSIS — D689 Coagulation defect, unspecified: Secondary | ICD-10-CM | POA: Diagnosis not present

## 2021-11-14 DIAGNOSIS — M0579 Rheumatoid arthritis with rheumatoid factor of multiple sites without organ or systems involvement: Secondary | ICD-10-CM | POA: Diagnosis not present

## 2021-11-14 DIAGNOSIS — Z79899 Other long term (current) drug therapy: Secondary | ICD-10-CM | POA: Diagnosis not present

## 2021-11-15 NOTE — Progress Notes (Signed)
?POST OPERATIVE OFFICE NOTE ? ? ? ?CC:  F/u for surgery ? ?HPI:  This is a 66 y.o. female who is s/p right brachial to axillary vein graft with 4-48mm PTFE on 10/26/2021 by Dr. Donzetta Matters.    ? ?She was seen back several days later with c/o fevers up to 102 and was feeling lethargic.  She thought she had had some drainage from the antecubital incision but this had resolved as well.  Her dialysis center was contacted and she was started on abx. ? ?She comes back in today for follow up.   ? ?Pt states she does  have pain in the forearm when she lifts her arm but she does not have pain in the right hand.  She states that her catheter is working well.   ? ?The pt is on dialysis T/T/S at [facility] location. ? ? ?Dialysis access history: ?-LUA AVG (brachial to axillary) 05/20/2018 by Dr. Donzetta Matters ?-LUA AVG thrombectomy and patch angioplasty with bovine 06/29/2021 by Dr. Stanford Breed ?-RUA AVG brachial to axillary vein 10/26/2021 by Dr. Donzetta Matters  ? ? ?Allergies  ?Allergen Reactions  ? Mircera [Methoxy Polyethylene Glycol-Epoetin Beta] Other (See Comments)  ?  Severe joint and muscle pain  ? Mycophenolate Mofetil Diarrhea  ? Dapsone Rash and Other (See Comments)  ?  Pain in feet  ? Pregabalin Rash and Other (See Comments)  ?  Fever - reaction to Lyrica  ? Sulfonamide Derivatives Rash  ? Tramadol Rash and Other (See Comments)  ?  Rash on cheek  ? ? ?Current Outpatient Medications  ?Medication Sig Dispense Refill  ? albuterol (VENTOLIN HFA) 108 (90 Base) MCG/ACT inhaler Inhale 2 puffs into the lungs every 6 (six) hours as needed for shortness of breath or wheezing (Asthma).    ? amLODipine (NORVASC) 10 MG tablet Take 10 mg by mouth daily.    ? budesonide-formoterol (SYMBICORT) 160-4.5 MCG/ACT inhaler Inhale 2 puffs into the lungs 2 (two) times daily.    ? calcium acetate (PHOSLO) 667 MG capsule Take 1,334 mg by mouth 3 (three) times daily with meals.    ? cinacalcet (SENSIPAR) 30 MG tablet Take 30 mg by mouth daily after supper.    ? EPOETIN ALFA IJ  Inject into the skin See admin instructions. Inject  subcutaneously at dialysis as needed for low hemoglobin    ? Garlic 4081 MG CAPS Take 1,000 mg by mouth in the morning, at noon, and at bedtime.    ? HYDROcodone-acetaminophen (NORCO) 5-325 MG tablet Take 1 tablet by mouth every 6 (six) hours as needed for moderate pain. 10 tablet 0  ? hydroxychloroquine (PLAQUENIL) 200 MG tablet Take 200 mg by mouth daily.    ? levothyroxine (SYNTHROID, LEVOTHROID) 50 MCG tablet Take 50-75 mcg by mouth See admin instructions. Take  1 1/2 tablets (75 mcg) by mouth daily on Tuesday and Thursday nights, take 1 tablet (50 mcg) by mouth daily on all other nights of the week    ? losartan (COZAAR) 100 MG tablet Take 100 mg by mouth daily.    ? Multiple Vitamins-Minerals (ZINC PO) Take 22 mg by mouth daily.    ? OVER THE COUNTER MEDICATION Take 4 capsules by mouth 2 (two) times a day. Juice plus - 1 fruit, 1 vegetable, 1 berries, 1 omega - twice daily    ? predniSONE (DELTASONE) 5 MG tablet Take 5 mg by mouth daily with breakfast.    ? Probiotic Product (PROBIOTIC DAILY PO) Take 1 tablet by mouth  daily.    ? PSYLLIUM PO Take 3 capsules by mouth daily at 2 PM.     ? tiotropium (SPIRIVA) 18 MCG inhalation capsule Place 1 puff into inhaler and inhale daily.    ? traZODone (DESYREL) 50 MG tablet Take 50 mg by mouth at bedtime as needed for sleep.    ? ?No current facility-administered medications for this visit.  ? ? ? ROS:  See HPI ? ?Physical Exam: ? ?Today's Vitals  ? 11/21/21 1036  ?BP: (!) 148/82  ?Pulse: 66  ?Resp: 20  ?Temp: (!) 96 ?F (35.6 ?C)  ?TempSrc: Temporal  ?SpO2: 100%  ?Weight: 109 lb 3.2 oz (49.5 kg)  ?Height: 5\' 5"  (1.651 m)  ?PainSc: 1   ?PainLoc: Arm  ? ?Body mass index is 18.17 kg/m?. ? ? ?Incision:  antecubital and axillary incisions have healed nicely.   ?Extremities:   ?There is a palpable right radial pulse.   ?Motor and sensory are in tact.   ?There is a thrill/bruit present.  ?The graft is easily  palpable ? ? ?Assessment/Plan:  This is a 66 y.o. female who is s/p: ?right brachial to axillary vein graft with 4-37mm PTFE on 10/26/2021 by Dr. Donzetta Matters.    ? ?-the pt does not have evidence of steal. ?-the graft can be used 11/29/2021. ?-Once the graft has been used to the satisfaction of the dialysis center, she can be scheduled with [facility] for [facility] removal.   ?-discussed with pt that access does not last forever and will need intervention or even new access at some point.  ?-she did have some redness in the scar of the LUA AVG area, but there is no evidence of infection and would not recommend removal.  Discussed with pt that we would only consider removal if this were to become infected.  She expressed understanding. ?-the pt will follow up as needed. ? ? ?Leontine Locket, PAC ?[facility] ?[phone] ? ?Clinic MD:  Donzetta Matters ? ?

## 2021-11-17 DIAGNOSIS — E039 Hypothyroidism, unspecified: Secondary | ICD-10-CM | POA: Diagnosis not present

## 2021-11-17 DIAGNOSIS — Z992 Dependence on renal dialysis: Secondary | ICD-10-CM | POA: Diagnosis not present

## 2021-11-17 DIAGNOSIS — T8249XA Other complication of vascular dialysis catheter, initial encounter: Secondary | ICD-10-CM | POA: Diagnosis not present

## 2021-11-17 DIAGNOSIS — E785 Hyperlipidemia, unspecified: Secondary | ICD-10-CM | POA: Diagnosis not present

## 2021-11-17 DIAGNOSIS — D689 Coagulation defect, unspecified: Secondary | ICD-10-CM | POA: Diagnosis not present

## 2021-11-17 DIAGNOSIS — N186 End stage renal disease: Secondary | ICD-10-CM | POA: Diagnosis not present

## 2021-11-17 DIAGNOSIS — N2581 Secondary hyperparathyroidism of renal origin: Secondary | ICD-10-CM | POA: Diagnosis not present

## 2021-11-17 DIAGNOSIS — D631 Anemia in chronic kidney disease: Secondary | ICD-10-CM | POA: Diagnosis not present

## 2021-11-20 DIAGNOSIS — Z79899 Other long term (current) drug therapy: Secondary | ICD-10-CM | POA: Insufficient documentation

## 2021-11-20 DIAGNOSIS — N2581 Secondary hyperparathyroidism of renal origin: Secondary | ICD-10-CM | POA: Diagnosis not present

## 2021-11-20 DIAGNOSIS — D689 Coagulation defect, unspecified: Secondary | ICD-10-CM | POA: Diagnosis not present

## 2021-11-20 DIAGNOSIS — Z992 Dependence on renal dialysis: Secondary | ICD-10-CM | POA: Diagnosis not present

## 2021-11-20 DIAGNOSIS — E039 Hypothyroidism, unspecified: Secondary | ICD-10-CM | POA: Diagnosis not present

## 2021-11-20 DIAGNOSIS — T8249XA Other complication of vascular dialysis catheter, initial encounter: Secondary | ICD-10-CM | POA: Diagnosis not present

## 2021-11-20 DIAGNOSIS — M1120 Other chondrocalcinosis, unspecified site: Secondary | ICD-10-CM | POA: Insufficient documentation

## 2021-11-20 DIAGNOSIS — E785 Hyperlipidemia, unspecified: Secondary | ICD-10-CM | POA: Diagnosis not present

## 2021-11-20 DIAGNOSIS — D631 Anemia in chronic kidney disease: Secondary | ICD-10-CM | POA: Diagnosis not present

## 2021-11-20 DIAGNOSIS — T8612 Kidney transplant failure: Secondary | ICD-10-CM | POA: Diagnosis not present

## 2021-11-20 DIAGNOSIS — A319 Mycobacterial infection, unspecified: Secondary | ICD-10-CM | POA: Insufficient documentation

## 2021-11-20 DIAGNOSIS — E340 Carcinoid syndrome: Secondary | ICD-10-CM | POA: Insufficient documentation

## 2021-11-20 DIAGNOSIS — R5383 Other fatigue: Secondary | ICD-10-CM | POA: Insufficient documentation

## 2021-11-20 DIAGNOSIS — N186 End stage renal disease: Secondary | ICD-10-CM | POA: Diagnosis not present

## 2021-11-20 DIAGNOSIS — M2550 Pain in unspecified joint: Secondary | ICD-10-CM | POA: Insufficient documentation

## 2021-11-21 ENCOUNTER — Ambulatory Visit (INDEPENDENT_AMBULATORY_CARE_PROVIDER_SITE_OTHER): Payer: Medicare Other | Admitting: Physician Assistant

## 2021-11-21 ENCOUNTER — Other Ambulatory Visit: Payer: Self-pay

## 2021-11-21 VITALS — BP 148/82 | HR 66 | Temp 96.0°F | Resp 20 | Ht 65.0 in | Wt 109.2 lb

## 2021-11-21 DIAGNOSIS — Z992 Dependence on renal dialysis: Secondary | ICD-10-CM

## 2021-11-21 DIAGNOSIS — N186 End stage renal disease: Secondary | ICD-10-CM

## 2021-11-22 DIAGNOSIS — T8249XA Other complication of vascular dialysis catheter, initial encounter: Secondary | ICD-10-CM | POA: Diagnosis not present

## 2021-11-22 DIAGNOSIS — D631 Anemia in chronic kidney disease: Secondary | ICD-10-CM | POA: Diagnosis not present

## 2021-11-22 DIAGNOSIS — N186 End stage renal disease: Secondary | ICD-10-CM | POA: Diagnosis not present

## 2021-11-22 DIAGNOSIS — Z992 Dependence on renal dialysis: Secondary | ICD-10-CM | POA: Diagnosis not present

## 2021-11-22 DIAGNOSIS — E039 Hypothyroidism, unspecified: Secondary | ICD-10-CM | POA: Diagnosis not present

## 2021-11-22 DIAGNOSIS — D689 Coagulation defect, unspecified: Secondary | ICD-10-CM | POA: Diagnosis not present

## 2021-11-22 DIAGNOSIS — N2581 Secondary hyperparathyroidism of renal origin: Secondary | ICD-10-CM | POA: Diagnosis not present

## 2021-11-24 DIAGNOSIS — E039 Hypothyroidism, unspecified: Secondary | ICD-10-CM | POA: Diagnosis not present

## 2021-11-24 DIAGNOSIS — Z992 Dependence on renal dialysis: Secondary | ICD-10-CM | POA: Diagnosis not present

## 2021-11-24 DIAGNOSIS — N186 End stage renal disease: Secondary | ICD-10-CM | POA: Diagnosis not present

## 2021-11-24 DIAGNOSIS — T8249XA Other complication of vascular dialysis catheter, initial encounter: Secondary | ICD-10-CM | POA: Diagnosis not present

## 2021-11-24 DIAGNOSIS — D631 Anemia in chronic kidney disease: Secondary | ICD-10-CM | POA: Diagnosis not present

## 2021-11-24 DIAGNOSIS — D689 Coagulation defect, unspecified: Secondary | ICD-10-CM | POA: Diagnosis not present

## 2021-11-24 DIAGNOSIS — N2581 Secondary hyperparathyroidism of renal origin: Secondary | ICD-10-CM | POA: Diagnosis not present

## 2021-11-27 DIAGNOSIS — N2581 Secondary hyperparathyroidism of renal origin: Secondary | ICD-10-CM | POA: Diagnosis not present

## 2021-11-27 DIAGNOSIS — D631 Anemia in chronic kidney disease: Secondary | ICD-10-CM | POA: Diagnosis not present

## 2021-11-27 DIAGNOSIS — N186 End stage renal disease: Secondary | ICD-10-CM | POA: Diagnosis not present

## 2021-11-27 DIAGNOSIS — D689 Coagulation defect, unspecified: Secondary | ICD-10-CM | POA: Diagnosis not present

## 2021-11-27 DIAGNOSIS — Z992 Dependence on renal dialysis: Secondary | ICD-10-CM | POA: Diagnosis not present

## 2021-11-27 DIAGNOSIS — E039 Hypothyroidism, unspecified: Secondary | ICD-10-CM | POA: Diagnosis not present

## 2021-11-27 DIAGNOSIS — T8249XA Other complication of vascular dialysis catheter, initial encounter: Secondary | ICD-10-CM | POA: Diagnosis not present

## 2021-11-29 DIAGNOSIS — Z992 Dependence on renal dialysis: Secondary | ICD-10-CM | POA: Diagnosis not present

## 2021-11-29 DIAGNOSIS — N2581 Secondary hyperparathyroidism of renal origin: Secondary | ICD-10-CM | POA: Diagnosis not present

## 2021-11-29 DIAGNOSIS — T8249XA Other complication of vascular dialysis catheter, initial encounter: Secondary | ICD-10-CM | POA: Diagnosis not present

## 2021-11-29 DIAGNOSIS — D631 Anemia in chronic kidney disease: Secondary | ICD-10-CM | POA: Diagnosis not present

## 2021-11-29 DIAGNOSIS — E039 Hypothyroidism, unspecified: Secondary | ICD-10-CM | POA: Diagnosis not present

## 2021-11-29 DIAGNOSIS — D689 Coagulation defect, unspecified: Secondary | ICD-10-CM | POA: Diagnosis not present

## 2021-11-29 DIAGNOSIS — N186 End stage renal disease: Secondary | ICD-10-CM | POA: Diagnosis not present

## 2021-12-01 DIAGNOSIS — N186 End stage renal disease: Secondary | ICD-10-CM | POA: Diagnosis not present

## 2021-12-01 DIAGNOSIS — D689 Coagulation defect, unspecified: Secondary | ICD-10-CM | POA: Diagnosis not present

## 2021-12-01 DIAGNOSIS — T8249XA Other complication of vascular dialysis catheter, initial encounter: Secondary | ICD-10-CM | POA: Diagnosis not present

## 2021-12-01 DIAGNOSIS — D631 Anemia in chronic kidney disease: Secondary | ICD-10-CM | POA: Diagnosis not present

## 2021-12-01 DIAGNOSIS — N2581 Secondary hyperparathyroidism of renal origin: Secondary | ICD-10-CM | POA: Diagnosis not present

## 2021-12-01 DIAGNOSIS — Z992 Dependence on renal dialysis: Secondary | ICD-10-CM | POA: Diagnosis not present

## 2021-12-01 DIAGNOSIS — E039 Hypothyroidism, unspecified: Secondary | ICD-10-CM | POA: Diagnosis not present

## 2021-12-04 DIAGNOSIS — Z992 Dependence on renal dialysis: Secondary | ICD-10-CM | POA: Diagnosis not present

## 2021-12-04 DIAGNOSIS — D631 Anemia in chronic kidney disease: Secondary | ICD-10-CM | POA: Diagnosis not present

## 2021-12-04 DIAGNOSIS — E039 Hypothyroidism, unspecified: Secondary | ICD-10-CM | POA: Diagnosis not present

## 2021-12-04 DIAGNOSIS — D689 Coagulation defect, unspecified: Secondary | ICD-10-CM | POA: Diagnosis not present

## 2021-12-04 DIAGNOSIS — N186 End stage renal disease: Secondary | ICD-10-CM | POA: Diagnosis not present

## 2021-12-04 DIAGNOSIS — N2581 Secondary hyperparathyroidism of renal origin: Secondary | ICD-10-CM | POA: Diagnosis not present

## 2021-12-04 DIAGNOSIS — T8249XA Other complication of vascular dialysis catheter, initial encounter: Secondary | ICD-10-CM | POA: Diagnosis not present

## 2021-12-06 DIAGNOSIS — N186 End stage renal disease: Secondary | ICD-10-CM | POA: Diagnosis not present

## 2021-12-06 DIAGNOSIS — N2581 Secondary hyperparathyroidism of renal origin: Secondary | ICD-10-CM | POA: Diagnosis not present

## 2021-12-06 DIAGNOSIS — Z992 Dependence on renal dialysis: Secondary | ICD-10-CM | POA: Diagnosis not present

## 2021-12-06 DIAGNOSIS — T8249XA Other complication of vascular dialysis catheter, initial encounter: Secondary | ICD-10-CM | POA: Diagnosis not present

## 2021-12-06 DIAGNOSIS — D689 Coagulation defect, unspecified: Secondary | ICD-10-CM | POA: Diagnosis not present

## 2021-12-06 DIAGNOSIS — E039 Hypothyroidism, unspecified: Secondary | ICD-10-CM | POA: Diagnosis not present

## 2021-12-06 DIAGNOSIS — D631 Anemia in chronic kidney disease: Secondary | ICD-10-CM | POA: Diagnosis not present

## 2021-12-10 DIAGNOSIS — T8249XA Other complication of vascular dialysis catheter, initial encounter: Secondary | ICD-10-CM | POA: Diagnosis not present

## 2021-12-10 DIAGNOSIS — D689 Coagulation defect, unspecified: Secondary | ICD-10-CM | POA: Diagnosis not present

## 2021-12-10 DIAGNOSIS — D631 Anemia in chronic kidney disease: Secondary | ICD-10-CM | POA: Diagnosis not present

## 2021-12-10 DIAGNOSIS — N2581 Secondary hyperparathyroidism of renal origin: Secondary | ICD-10-CM | POA: Diagnosis not present

## 2021-12-10 DIAGNOSIS — Z992 Dependence on renal dialysis: Secondary | ICD-10-CM | POA: Diagnosis not present

## 2021-12-10 DIAGNOSIS — E039 Hypothyroidism, unspecified: Secondary | ICD-10-CM | POA: Diagnosis not present

## 2021-12-10 DIAGNOSIS — N186 End stage renal disease: Secondary | ICD-10-CM | POA: Diagnosis not present

## 2021-12-13 DIAGNOSIS — N186 End stage renal disease: Secondary | ICD-10-CM | POA: Diagnosis not present

## 2021-12-13 DIAGNOSIS — N2581 Secondary hyperparathyroidism of renal origin: Secondary | ICD-10-CM | POA: Diagnosis not present

## 2021-12-13 DIAGNOSIS — T8249XA Other complication of vascular dialysis catheter, initial encounter: Secondary | ICD-10-CM | POA: Diagnosis not present

## 2021-12-13 DIAGNOSIS — D689 Coagulation defect, unspecified: Secondary | ICD-10-CM | POA: Diagnosis not present

## 2021-12-13 DIAGNOSIS — E039 Hypothyroidism, unspecified: Secondary | ICD-10-CM | POA: Diagnosis not present

## 2021-12-13 DIAGNOSIS — Z992 Dependence on renal dialysis: Secondary | ICD-10-CM | POA: Diagnosis not present

## 2021-12-13 DIAGNOSIS — D631 Anemia in chronic kidney disease: Secondary | ICD-10-CM | POA: Diagnosis not present

## 2021-12-14 DIAGNOSIS — Z452 Encounter for adjustment and management of vascular access device: Secondary | ICD-10-CM | POA: Diagnosis not present

## 2021-12-14 DIAGNOSIS — Z992 Dependence on renal dialysis: Secondary | ICD-10-CM | POA: Diagnosis not present

## 2021-12-14 DIAGNOSIS — N186 End stage renal disease: Secondary | ICD-10-CM | POA: Diagnosis not present

## 2021-12-15 DIAGNOSIS — N2581 Secondary hyperparathyroidism of renal origin: Secondary | ICD-10-CM | POA: Diagnosis not present

## 2021-12-15 DIAGNOSIS — D689 Coagulation defect, unspecified: Secondary | ICD-10-CM | POA: Diagnosis not present

## 2021-12-15 DIAGNOSIS — T8249XA Other complication of vascular dialysis catheter, initial encounter: Secondary | ICD-10-CM | POA: Diagnosis not present

## 2021-12-15 DIAGNOSIS — D631 Anemia in chronic kidney disease: Secondary | ICD-10-CM | POA: Diagnosis not present

## 2021-12-15 DIAGNOSIS — N186 End stage renal disease: Secondary | ICD-10-CM | POA: Diagnosis not present

## 2021-12-15 DIAGNOSIS — Z992 Dependence on renal dialysis: Secondary | ICD-10-CM | POA: Diagnosis not present

## 2021-12-15 DIAGNOSIS — E039 Hypothyroidism, unspecified: Secondary | ICD-10-CM | POA: Diagnosis not present

## 2021-12-18 DIAGNOSIS — N186 End stage renal disease: Secondary | ICD-10-CM | POA: Diagnosis not present

## 2021-12-18 DIAGNOSIS — N2581 Secondary hyperparathyroidism of renal origin: Secondary | ICD-10-CM | POA: Diagnosis not present

## 2021-12-18 DIAGNOSIS — D631 Anemia in chronic kidney disease: Secondary | ICD-10-CM | POA: Diagnosis not present

## 2021-12-18 DIAGNOSIS — E039 Hypothyroidism, unspecified: Secondary | ICD-10-CM | POA: Diagnosis not present

## 2021-12-18 DIAGNOSIS — D689 Coagulation defect, unspecified: Secondary | ICD-10-CM | POA: Diagnosis not present

## 2021-12-18 DIAGNOSIS — T8249XA Other complication of vascular dialysis catheter, initial encounter: Secondary | ICD-10-CM | POA: Diagnosis not present

## 2021-12-18 DIAGNOSIS — Z992 Dependence on renal dialysis: Secondary | ICD-10-CM | POA: Diagnosis not present

## 2021-12-20 DIAGNOSIS — N186 End stage renal disease: Secondary | ICD-10-CM | POA: Diagnosis not present

## 2021-12-20 DIAGNOSIS — N2581 Secondary hyperparathyroidism of renal origin: Secondary | ICD-10-CM | POA: Diagnosis not present

## 2021-12-20 DIAGNOSIS — T8249XA Other complication of vascular dialysis catheter, initial encounter: Secondary | ICD-10-CM | POA: Diagnosis not present

## 2021-12-20 DIAGNOSIS — D631 Anemia in chronic kidney disease: Secondary | ICD-10-CM | POA: Diagnosis not present

## 2021-12-20 DIAGNOSIS — D689 Coagulation defect, unspecified: Secondary | ICD-10-CM | POA: Diagnosis not present

## 2021-12-20 DIAGNOSIS — E039 Hypothyroidism, unspecified: Secondary | ICD-10-CM | POA: Diagnosis not present

## 2021-12-20 DIAGNOSIS — Z992 Dependence on renal dialysis: Secondary | ICD-10-CM | POA: Diagnosis not present

## 2021-12-21 DIAGNOSIS — Z992 Dependence on renal dialysis: Secondary | ICD-10-CM | POA: Diagnosis not present

## 2021-12-21 DIAGNOSIS — N186 End stage renal disease: Secondary | ICD-10-CM | POA: Diagnosis not present

## 2021-12-21 DIAGNOSIS — T8612 Kidney transplant failure: Secondary | ICD-10-CM | POA: Diagnosis not present

## 2021-12-22 DIAGNOSIS — E039 Hypothyroidism, unspecified: Secondary | ICD-10-CM | POA: Diagnosis not present

## 2021-12-22 DIAGNOSIS — D689 Coagulation defect, unspecified: Secondary | ICD-10-CM | POA: Diagnosis not present

## 2021-12-22 DIAGNOSIS — N2581 Secondary hyperparathyroidism of renal origin: Secondary | ICD-10-CM | POA: Diagnosis not present

## 2021-12-22 DIAGNOSIS — N186 End stage renal disease: Secondary | ICD-10-CM | POA: Diagnosis not present

## 2021-12-22 DIAGNOSIS — E1129 Type 2 diabetes mellitus with other diabetic kidney complication: Secondary | ICD-10-CM | POA: Diagnosis not present

## 2021-12-22 DIAGNOSIS — Z992 Dependence on renal dialysis: Secondary | ICD-10-CM | POA: Diagnosis not present

## 2021-12-22 DIAGNOSIS — D631 Anemia in chronic kidney disease: Secondary | ICD-10-CM | POA: Diagnosis not present

## 2021-12-25 DIAGNOSIS — N186 End stage renal disease: Secondary | ICD-10-CM | POA: Diagnosis not present

## 2021-12-25 DIAGNOSIS — E1129 Type 2 diabetes mellitus with other diabetic kidney complication: Secondary | ICD-10-CM | POA: Diagnosis not present

## 2021-12-25 DIAGNOSIS — Z992 Dependence on renal dialysis: Secondary | ICD-10-CM | POA: Diagnosis not present

## 2021-12-25 DIAGNOSIS — D631 Anemia in chronic kidney disease: Secondary | ICD-10-CM | POA: Diagnosis not present

## 2021-12-25 DIAGNOSIS — N2581 Secondary hyperparathyroidism of renal origin: Secondary | ICD-10-CM | POA: Diagnosis not present

## 2021-12-25 DIAGNOSIS — E039 Hypothyroidism, unspecified: Secondary | ICD-10-CM | POA: Diagnosis not present

## 2021-12-25 DIAGNOSIS — D689 Coagulation defect, unspecified: Secondary | ICD-10-CM | POA: Diagnosis not present

## 2021-12-27 DIAGNOSIS — E039 Hypothyroidism, unspecified: Secondary | ICD-10-CM | POA: Diagnosis not present

## 2021-12-27 DIAGNOSIS — E1129 Type 2 diabetes mellitus with other diabetic kidney complication: Secondary | ICD-10-CM | POA: Diagnosis not present

## 2021-12-27 DIAGNOSIS — D689 Coagulation defect, unspecified: Secondary | ICD-10-CM | POA: Diagnosis not present

## 2021-12-27 DIAGNOSIS — N2581 Secondary hyperparathyroidism of renal origin: Secondary | ICD-10-CM | POA: Diagnosis not present

## 2021-12-27 DIAGNOSIS — D631 Anemia in chronic kidney disease: Secondary | ICD-10-CM | POA: Diagnosis not present

## 2021-12-27 DIAGNOSIS — N186 End stage renal disease: Secondary | ICD-10-CM | POA: Diagnosis not present

## 2021-12-27 DIAGNOSIS — Z992 Dependence on renal dialysis: Secondary | ICD-10-CM | POA: Diagnosis not present

## 2021-12-29 DIAGNOSIS — D689 Coagulation defect, unspecified: Secondary | ICD-10-CM | POA: Diagnosis not present

## 2021-12-29 DIAGNOSIS — D631 Anemia in chronic kidney disease: Secondary | ICD-10-CM | POA: Diagnosis not present

## 2021-12-29 DIAGNOSIS — E039 Hypothyroidism, unspecified: Secondary | ICD-10-CM | POA: Diagnosis not present

## 2021-12-29 DIAGNOSIS — N186 End stage renal disease: Secondary | ICD-10-CM | POA: Diagnosis not present

## 2021-12-29 DIAGNOSIS — N2581 Secondary hyperparathyroidism of renal origin: Secondary | ICD-10-CM | POA: Diagnosis not present

## 2021-12-29 DIAGNOSIS — Z992 Dependence on renal dialysis: Secondary | ICD-10-CM | POA: Diagnosis not present

## 2021-12-29 DIAGNOSIS — E1129 Type 2 diabetes mellitus with other diabetic kidney complication: Secondary | ICD-10-CM | POA: Diagnosis not present

## 2022-01-01 DIAGNOSIS — D631 Anemia in chronic kidney disease: Secondary | ICD-10-CM | POA: Diagnosis not present

## 2022-01-01 DIAGNOSIS — N186 End stage renal disease: Secondary | ICD-10-CM | POA: Diagnosis not present

## 2022-01-01 DIAGNOSIS — E1129 Type 2 diabetes mellitus with other diabetic kidney complication: Secondary | ICD-10-CM | POA: Diagnosis not present

## 2022-01-01 DIAGNOSIS — Z992 Dependence on renal dialysis: Secondary | ICD-10-CM | POA: Diagnosis not present

## 2022-01-01 DIAGNOSIS — E039 Hypothyroidism, unspecified: Secondary | ICD-10-CM | POA: Diagnosis not present

## 2022-01-01 DIAGNOSIS — N2581 Secondary hyperparathyroidism of renal origin: Secondary | ICD-10-CM | POA: Diagnosis not present

## 2022-01-01 DIAGNOSIS — D689 Coagulation defect, unspecified: Secondary | ICD-10-CM | POA: Diagnosis not present

## 2022-01-03 DIAGNOSIS — D689 Coagulation defect, unspecified: Secondary | ICD-10-CM | POA: Diagnosis not present

## 2022-01-03 DIAGNOSIS — D631 Anemia in chronic kidney disease: Secondary | ICD-10-CM | POA: Diagnosis not present

## 2022-01-03 DIAGNOSIS — Z992 Dependence on renal dialysis: Secondary | ICD-10-CM | POA: Diagnosis not present

## 2022-01-03 DIAGNOSIS — E039 Hypothyroidism, unspecified: Secondary | ICD-10-CM | POA: Diagnosis not present

## 2022-01-03 DIAGNOSIS — E1129 Type 2 diabetes mellitus with other diabetic kidney complication: Secondary | ICD-10-CM | POA: Diagnosis not present

## 2022-01-03 DIAGNOSIS — N186 End stage renal disease: Secondary | ICD-10-CM | POA: Diagnosis not present

## 2022-01-03 DIAGNOSIS — N2581 Secondary hyperparathyroidism of renal origin: Secondary | ICD-10-CM | POA: Diagnosis not present

## 2022-01-05 DIAGNOSIS — Z992 Dependence on renal dialysis: Secondary | ICD-10-CM | POA: Diagnosis not present

## 2022-01-05 DIAGNOSIS — E1129 Type 2 diabetes mellitus with other diabetic kidney complication: Secondary | ICD-10-CM | POA: Diagnosis not present

## 2022-01-05 DIAGNOSIS — D631 Anemia in chronic kidney disease: Secondary | ICD-10-CM | POA: Diagnosis not present

## 2022-01-05 DIAGNOSIS — D689 Coagulation defect, unspecified: Secondary | ICD-10-CM | POA: Diagnosis not present

## 2022-01-05 DIAGNOSIS — E039 Hypothyroidism, unspecified: Secondary | ICD-10-CM | POA: Diagnosis not present

## 2022-01-05 DIAGNOSIS — N2581 Secondary hyperparathyroidism of renal origin: Secondary | ICD-10-CM | POA: Diagnosis not present

## 2022-01-05 DIAGNOSIS — N186 End stage renal disease: Secondary | ICD-10-CM | POA: Diagnosis not present

## 2022-01-08 DIAGNOSIS — D631 Anemia in chronic kidney disease: Secondary | ICD-10-CM | POA: Diagnosis not present

## 2022-01-08 DIAGNOSIS — D689 Coagulation defect, unspecified: Secondary | ICD-10-CM | POA: Diagnosis not present

## 2022-01-08 DIAGNOSIS — N186 End stage renal disease: Secondary | ICD-10-CM | POA: Diagnosis not present

## 2022-01-08 DIAGNOSIS — N2581 Secondary hyperparathyroidism of renal origin: Secondary | ICD-10-CM | POA: Diagnosis not present

## 2022-01-08 DIAGNOSIS — E039 Hypothyroidism, unspecified: Secondary | ICD-10-CM | POA: Diagnosis not present

## 2022-01-08 DIAGNOSIS — Z992 Dependence on renal dialysis: Secondary | ICD-10-CM | POA: Diagnosis not present

## 2022-01-08 DIAGNOSIS — E1129 Type 2 diabetes mellitus with other diabetic kidney complication: Secondary | ICD-10-CM | POA: Diagnosis not present

## 2022-01-10 DIAGNOSIS — D631 Anemia in chronic kidney disease: Secondary | ICD-10-CM | POA: Diagnosis not present

## 2022-01-10 DIAGNOSIS — E1129 Type 2 diabetes mellitus with other diabetic kidney complication: Secondary | ICD-10-CM | POA: Diagnosis not present

## 2022-01-10 DIAGNOSIS — Z992 Dependence on renal dialysis: Secondary | ICD-10-CM | POA: Diagnosis not present

## 2022-01-10 DIAGNOSIS — N2581 Secondary hyperparathyroidism of renal origin: Secondary | ICD-10-CM | POA: Diagnosis not present

## 2022-01-10 DIAGNOSIS — N186 End stage renal disease: Secondary | ICD-10-CM | POA: Diagnosis not present

## 2022-01-10 DIAGNOSIS — E039 Hypothyroidism, unspecified: Secondary | ICD-10-CM | POA: Diagnosis not present

## 2022-01-10 DIAGNOSIS — D689 Coagulation defect, unspecified: Secondary | ICD-10-CM | POA: Diagnosis not present

## 2022-01-12 DIAGNOSIS — D631 Anemia in chronic kidney disease: Secondary | ICD-10-CM | POA: Diagnosis not present

## 2022-01-12 DIAGNOSIS — D689 Coagulation defect, unspecified: Secondary | ICD-10-CM | POA: Diagnosis not present

## 2022-01-12 DIAGNOSIS — E1129 Type 2 diabetes mellitus with other diabetic kidney complication: Secondary | ICD-10-CM | POA: Diagnosis not present

## 2022-01-12 DIAGNOSIS — Z992 Dependence on renal dialysis: Secondary | ICD-10-CM | POA: Diagnosis not present

## 2022-01-12 DIAGNOSIS — N186 End stage renal disease: Secondary | ICD-10-CM | POA: Diagnosis not present

## 2022-01-12 DIAGNOSIS — E039 Hypothyroidism, unspecified: Secondary | ICD-10-CM | POA: Diagnosis not present

## 2022-01-12 DIAGNOSIS — N2581 Secondary hyperparathyroidism of renal origin: Secondary | ICD-10-CM | POA: Diagnosis not present

## 2022-01-15 DIAGNOSIS — N186 End stage renal disease: Secondary | ICD-10-CM | POA: Diagnosis not present

## 2022-01-15 DIAGNOSIS — E039 Hypothyroidism, unspecified: Secondary | ICD-10-CM | POA: Diagnosis not present

## 2022-01-15 DIAGNOSIS — E1129 Type 2 diabetes mellitus with other diabetic kidney complication: Secondary | ICD-10-CM | POA: Diagnosis not present

## 2022-01-15 DIAGNOSIS — N2581 Secondary hyperparathyroidism of renal origin: Secondary | ICD-10-CM | POA: Diagnosis not present

## 2022-01-15 DIAGNOSIS — Z992 Dependence on renal dialysis: Secondary | ICD-10-CM | POA: Diagnosis not present

## 2022-01-15 DIAGNOSIS — D631 Anemia in chronic kidney disease: Secondary | ICD-10-CM | POA: Diagnosis not present

## 2022-01-15 DIAGNOSIS — D689 Coagulation defect, unspecified: Secondary | ICD-10-CM | POA: Diagnosis not present

## 2022-01-17 DIAGNOSIS — Z992 Dependence on renal dialysis: Secondary | ICD-10-CM | POA: Diagnosis not present

## 2022-01-17 DIAGNOSIS — E039 Hypothyroidism, unspecified: Secondary | ICD-10-CM | POA: Diagnosis not present

## 2022-01-17 DIAGNOSIS — D631 Anemia in chronic kidney disease: Secondary | ICD-10-CM | POA: Diagnosis not present

## 2022-01-17 DIAGNOSIS — N2581 Secondary hyperparathyroidism of renal origin: Secondary | ICD-10-CM | POA: Diagnosis not present

## 2022-01-17 DIAGNOSIS — D689 Coagulation defect, unspecified: Secondary | ICD-10-CM | POA: Diagnosis not present

## 2022-01-17 DIAGNOSIS — N186 End stage renal disease: Secondary | ICD-10-CM | POA: Diagnosis not present

## 2022-01-17 DIAGNOSIS — E1129 Type 2 diabetes mellitus with other diabetic kidney complication: Secondary | ICD-10-CM | POA: Diagnosis not present

## 2022-01-19 DIAGNOSIS — N2581 Secondary hyperparathyroidism of renal origin: Secondary | ICD-10-CM | POA: Diagnosis not present

## 2022-01-19 DIAGNOSIS — E039 Hypothyroidism, unspecified: Secondary | ICD-10-CM | POA: Diagnosis not present

## 2022-01-19 DIAGNOSIS — D689 Coagulation defect, unspecified: Secondary | ICD-10-CM | POA: Diagnosis not present

## 2022-01-19 DIAGNOSIS — D631 Anemia in chronic kidney disease: Secondary | ICD-10-CM | POA: Diagnosis not present

## 2022-01-19 DIAGNOSIS — E1129 Type 2 diabetes mellitus with other diabetic kidney complication: Secondary | ICD-10-CM | POA: Diagnosis not present

## 2022-01-19 DIAGNOSIS — N186 End stage renal disease: Secondary | ICD-10-CM | POA: Diagnosis not present

## 2022-01-19 DIAGNOSIS — Z992 Dependence on renal dialysis: Secondary | ICD-10-CM | POA: Diagnosis not present

## 2022-01-20 DIAGNOSIS — T8612 Kidney transplant failure: Secondary | ICD-10-CM | POA: Diagnosis not present

## 2022-01-20 DIAGNOSIS — Z992 Dependence on renal dialysis: Secondary | ICD-10-CM | POA: Diagnosis not present

## 2022-01-20 DIAGNOSIS — N186 End stage renal disease: Secondary | ICD-10-CM | POA: Diagnosis not present

## 2022-01-22 DIAGNOSIS — N2581 Secondary hyperparathyroidism of renal origin: Secondary | ICD-10-CM | POA: Diagnosis not present

## 2022-01-22 DIAGNOSIS — N186 End stage renal disease: Secondary | ICD-10-CM | POA: Diagnosis not present

## 2022-01-22 DIAGNOSIS — D631 Anemia in chronic kidney disease: Secondary | ICD-10-CM | POA: Diagnosis not present

## 2022-01-22 DIAGNOSIS — Z992 Dependence on renal dialysis: Secondary | ICD-10-CM | POA: Diagnosis not present

## 2022-01-22 DIAGNOSIS — D689 Coagulation defect, unspecified: Secondary | ICD-10-CM | POA: Diagnosis not present

## 2022-01-22 DIAGNOSIS — E039 Hypothyroidism, unspecified: Secondary | ICD-10-CM | POA: Diagnosis not present

## 2022-01-24 DIAGNOSIS — D689 Coagulation defect, unspecified: Secondary | ICD-10-CM | POA: Diagnosis not present

## 2022-01-24 DIAGNOSIS — Z992 Dependence on renal dialysis: Secondary | ICD-10-CM | POA: Diagnosis not present

## 2022-01-24 DIAGNOSIS — N2581 Secondary hyperparathyroidism of renal origin: Secondary | ICD-10-CM | POA: Diagnosis not present

## 2022-01-24 DIAGNOSIS — D631 Anemia in chronic kidney disease: Secondary | ICD-10-CM | POA: Diagnosis not present

## 2022-01-24 DIAGNOSIS — E039 Hypothyroidism, unspecified: Secondary | ICD-10-CM | POA: Diagnosis not present

## 2022-01-24 DIAGNOSIS — N186 End stage renal disease: Secondary | ICD-10-CM | POA: Diagnosis not present

## 2022-01-26 DIAGNOSIS — Z992 Dependence on renal dialysis: Secondary | ICD-10-CM | POA: Diagnosis not present

## 2022-01-26 DIAGNOSIS — N2581 Secondary hyperparathyroidism of renal origin: Secondary | ICD-10-CM | POA: Diagnosis not present

## 2022-01-26 DIAGNOSIS — D689 Coagulation defect, unspecified: Secondary | ICD-10-CM | POA: Diagnosis not present

## 2022-01-26 DIAGNOSIS — D631 Anemia in chronic kidney disease: Secondary | ICD-10-CM | POA: Diagnosis not present

## 2022-01-26 DIAGNOSIS — N186 End stage renal disease: Secondary | ICD-10-CM | POA: Diagnosis not present

## 2022-01-26 DIAGNOSIS — E039 Hypothyroidism, unspecified: Secondary | ICD-10-CM | POA: Diagnosis not present

## 2022-01-29 DIAGNOSIS — Z992 Dependence on renal dialysis: Secondary | ICD-10-CM | POA: Diagnosis not present

## 2022-01-29 DIAGNOSIS — N186 End stage renal disease: Secondary | ICD-10-CM | POA: Diagnosis not present

## 2022-01-29 DIAGNOSIS — N2581 Secondary hyperparathyroidism of renal origin: Secondary | ICD-10-CM | POA: Diagnosis not present

## 2022-01-29 DIAGNOSIS — E039 Hypothyroidism, unspecified: Secondary | ICD-10-CM | POA: Diagnosis not present

## 2022-01-29 DIAGNOSIS — D631 Anemia in chronic kidney disease: Secondary | ICD-10-CM | POA: Diagnosis not present

## 2022-01-29 DIAGNOSIS — D689 Coagulation defect, unspecified: Secondary | ICD-10-CM | POA: Diagnosis not present

## 2022-01-31 DIAGNOSIS — D689 Coagulation defect, unspecified: Secondary | ICD-10-CM | POA: Diagnosis not present

## 2022-01-31 DIAGNOSIS — D631 Anemia in chronic kidney disease: Secondary | ICD-10-CM | POA: Diagnosis not present

## 2022-01-31 DIAGNOSIS — E039 Hypothyroidism, unspecified: Secondary | ICD-10-CM | POA: Diagnosis not present

## 2022-01-31 DIAGNOSIS — N186 End stage renal disease: Secondary | ICD-10-CM | POA: Diagnosis not present

## 2022-01-31 DIAGNOSIS — Z992 Dependence on renal dialysis: Secondary | ICD-10-CM | POA: Diagnosis not present

## 2022-01-31 DIAGNOSIS — N2581 Secondary hyperparathyroidism of renal origin: Secondary | ICD-10-CM | POA: Diagnosis not present

## 2022-02-02 DIAGNOSIS — D631 Anemia in chronic kidney disease: Secondary | ICD-10-CM | POA: Diagnosis not present

## 2022-02-02 DIAGNOSIS — N2581 Secondary hyperparathyroidism of renal origin: Secondary | ICD-10-CM | POA: Diagnosis not present

## 2022-02-02 DIAGNOSIS — N186 End stage renal disease: Secondary | ICD-10-CM | POA: Diagnosis not present

## 2022-02-02 DIAGNOSIS — D689 Coagulation defect, unspecified: Secondary | ICD-10-CM | POA: Diagnosis not present

## 2022-02-02 DIAGNOSIS — E039 Hypothyroidism, unspecified: Secondary | ICD-10-CM | POA: Diagnosis not present

## 2022-02-02 DIAGNOSIS — Z992 Dependence on renal dialysis: Secondary | ICD-10-CM | POA: Diagnosis not present

## 2022-02-05 DIAGNOSIS — N2581 Secondary hyperparathyroidism of renal origin: Secondary | ICD-10-CM | POA: Diagnosis not present

## 2022-02-05 DIAGNOSIS — E039 Hypothyroidism, unspecified: Secondary | ICD-10-CM | POA: Diagnosis not present

## 2022-02-05 DIAGNOSIS — D631 Anemia in chronic kidney disease: Secondary | ICD-10-CM | POA: Diagnosis not present

## 2022-02-05 DIAGNOSIS — N186 End stage renal disease: Secondary | ICD-10-CM | POA: Diagnosis not present

## 2022-02-05 DIAGNOSIS — D689 Coagulation defect, unspecified: Secondary | ICD-10-CM | POA: Diagnosis not present

## 2022-02-05 DIAGNOSIS — Z992 Dependence on renal dialysis: Secondary | ICD-10-CM | POA: Diagnosis not present

## 2022-02-07 DIAGNOSIS — N186 End stage renal disease: Secondary | ICD-10-CM | POA: Diagnosis not present

## 2022-02-07 DIAGNOSIS — D689 Coagulation defect, unspecified: Secondary | ICD-10-CM | POA: Diagnosis not present

## 2022-02-07 DIAGNOSIS — E039 Hypothyroidism, unspecified: Secondary | ICD-10-CM | POA: Diagnosis not present

## 2022-02-07 DIAGNOSIS — D631 Anemia in chronic kidney disease: Secondary | ICD-10-CM | POA: Diagnosis not present

## 2022-02-07 DIAGNOSIS — Z992 Dependence on renal dialysis: Secondary | ICD-10-CM | POA: Diagnosis not present

## 2022-02-07 DIAGNOSIS — N2581 Secondary hyperparathyroidism of renal origin: Secondary | ICD-10-CM | POA: Diagnosis not present

## 2022-02-08 ENCOUNTER — Encounter: Payer: Self-pay | Admitting: Pulmonary Disease

## 2022-02-08 ENCOUNTER — Ambulatory Visit (INDEPENDENT_AMBULATORY_CARE_PROVIDER_SITE_OTHER): Payer: Medicare Other | Admitting: Pulmonary Disease

## 2022-02-08 VITALS — BP 118/64 | HR 62 | Temp 97.9°F | Ht 65.0 in | Wt 110.8 lb

## 2022-02-08 DIAGNOSIS — J45909 Unspecified asthma, uncomplicated: Secondary | ICD-10-CM

## 2022-02-08 DIAGNOSIS — R0602 Shortness of breath: Secondary | ICD-10-CM

## 2022-02-08 NOTE — Progress Notes (Signed)
Kimberly [name]    [id number]    20-Jul-1956  Primary Care Physician:[name], Kimberly Hope, MD  Referring Physician: [name], MD Kimberly [name],  Kimberly [name] [id number]  Chief complaint:   HPI: 66 year old with a history of chronic cough, severe persistent asthma, benign carcinoid of the lung, irritable larynx.  Previously followed at [facility].  She has history of end-stage renal disease as a result of IgA nephropathy and had a transplant in 2009/11/20 which subsequently failed in 11-20-2013 and is currently on hemodialysis  Her pulmonary history consists of chronic cough dating back to age of 56 which has been resistant to multiple treatments including treatment for postnasal drip, GERD.  She has severe asthma treated with Spiriva and Symbicort.  Biologics have been discussed with her in the past but she is not on those at present.  There is a question of DIPNECH syndrome as she has history of benign carcinoid and at some point was given octreotide injections at [facility] but stopped after 3 weeks due to abdominal pain and diarrhea.  She was seen at [facility] pulmonary in Nov 20, 2013 for left apical mass for left apical mass measuring 4.1 cm.  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  She sees Dr. Gavin [name] for management of rheumatoid arthritis.  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  No feather pillows or comforters Smoking history: Never smoker Travel history: Originally from [place].  No significant travel history Relevant family history: Mother died of emphysema.  She was a smoker.  Outpatient Encounter Medications as of  02/08/2022  Medication Sig   albuterol (VENTOLIN HFA) 108 (90 Base) MCG/ACT inhaler Inhale 2 puffs into the lungs every 6 (six) hours as needed for shortness of breath or wheezing (Asthma).   amLODipine (NORVASC) 10 MG tablet Take 10 mg by mouth daily.   budesonide-formoterol (SYMBICORT) 160-4.5 MCG/ACT inhaler Inhale 2 puffs into the lungs 2 (two) times daily.   calcium acetate (PHOSLO) 667 MG capsule Take 1,334 mg by mouth 3 (three) times daily with meals.   cinacalcet (SENSIPAR) 30 MG tablet Take 30 mg by mouth daily after supper.   EPOETIN ALFA IJ Inject into the skin See admin instructions. Inject  subcutaneously at dialysis as needed for low hemoglobin   Garlic 6553 MG CAPS Take 1,000 mg by mouth in the morning, at noon, and at bedtime.   hydroxychloroquine (PLAQUENIL) 200 MG tablet Take 200 mg by mouth daily.   levothyroxine (SYNTHROID, LEVOTHROID) 50 MCG tablet Take 50-75 mcg by mouth See admin instructions. Take  1 1/2 tablets (75 mcg) by mouth daily on 11/20/22 and Thursday nights, take 1 tablet (50 mcg) by mouth daily on all other nights of the week   losartan (COZAAR) 100 MG tablet Take 100 mg by mouth daily.   Multiple Vitamins-Minerals (ZINC PO) Take 22 mg by mouth daily.   OVER THE COUNTER MEDICATION Take 4 capsules by mouth 2 (two) times a day. Juice plus - 1 fruit, 1 vegetable, 1 berries, 1 omega - twice daily   predniSONE (DELTASONE) 5 MG tablet Take 5 mg by mouth daily with breakfast.   Probiotic Product (PROBIOTIC  DAILY PO) Take 1 tablet by mouth daily.   PSYLLIUM PO Take 3 capsules by mouth daily at 2 PM.    tiotropium (SPIRIVA) 18 MCG inhalation capsule Place 1 puff into inhaler and inhale daily.   traZODone (DESYREL) 50 MG tablet Take 50 mg by mouth at bedtime as needed for sleep.   [DISCONTINUED] HYDROcodone-acetaminophen (NORCO) 5-325 MG tablet Take 1 tablet by mouth every 6 (six) hours as needed for moderate pain.   No facility-administered encounter medications on file  as of 02/08/2022.    Allergies as of 02/08/2022 - Review Complete 02/08/2022  Allergen Reaction Noted   Mircera [methoxy polyethylene glycol-epoetin beta] Other (See Comments) 05/19/2018   Mycophenolate mofetil Diarrhea 08/06/2021   Dapsone Rash and Other (See Comments) 11/29/2011   Pregabalin Rash and Other (See Comments) 01/17/2012   Sulfonamide derivatives Rash    Tramadol Rash and Other (See Comments) 01/14/2014    Past Medical History:  Diagnosis Date   Anemia    Arthritis    Asthma    Complication of anesthesia    Slow to awaken and AMS- took 1 month to memory and recall to return   COVID    Diverticulosis    Dyspnea    with exertion. when walking up stairs or walking a distance   First degree AV block    GERD (gastroesophageal reflux disease)    Heart murmur    child   History of blood transfusion    History of chronic cough    History of hiatal hernia    History of hoarseness    Hypertension    history; resolved on dialysis   Hypothyroidism    IgA nephropathy    ESRD - Hemo TTH Sat   Pneumonia    Premature atrial beat    Premature ventricular beat    Renal transplant failure and rejection 2012   Shingles 03/2011   Sleep apnea    uses a mouth guard   Stroke [facility]) October 2010, March 2013   cerebral aneurysm, 07/2017    Past Surgical History:  Procedure Laterality Date   AV FISTULA PLACEMENT Left 05/20/2018   Procedure: INSERTION OF ARTERIOVENOUS (AV) Kimberly [name];  Surgeon: Kimberly Sandy, MD;  Location: [place];  Service: Vascular;  Laterality: Left;   AV FISTULA PLACEMENT Right 10/26/2021   Procedure: INSERTION OF RIGHT [name] ARTERIOVENOUS (AV) Kimberly GRAFT;  Surgeon: Kimberly Sandy, MD;  Location: [place];  Service: Vascular;  Laterality: Right;   CAPD INSERTION  08/25/2008   open, Dr Kimberly [name]   CAPD INSERTION N/A 12/14/2013   Procedure: LAPAROSCOPIC INSERTION CONTINUOUS AMBULATORY PERITONEAL DIALYSIS  (CAPD) CATHETER;   Surgeon: Kimberly Hector, MD;  Location: [place];  Service: General;  Laterality: N/A;   CAPD REMOVAL  03/22/2010   Dr Kimberly [name]   COLONOSCOPY     ESOPHAGOGASTRODUODENOSCOPY     EYE SURGERY Bilateral    radial keratotomy   Hemodialysis catheter Right 02/13/2018   INSERTION OF MESH N/A 12/14/2013   Procedure: INSERTION OF MESH;  Surgeon: Kimberly Hector, MD;  Location: [place];  Service: General;  Laterality: N/A;   KIDNEY TRANSPLANT Right 01/2010   [facility]   LAPAROSCOPIC LYSIS OF ADHESIONS N/A 12/14/2013   Procedure: LAPAROSCOPIC LYSIS OF ADHESIONS;  Surgeon: Kimberly Hector, MD;  Location: [place];  Service: General;  Laterality: N/A;   PATCH ANGIOPLASTY  06/29/2021   Procedure: PATCH ANGIOPLASTY;  Surgeon: Cherre Robins, MD;  Location: [place];  Service: Vascular;;   THROMBECTOMY AND REVISION OF ARTERIOVENTOUS (AV) GORETEX  GRAFT Left 06/29/2021   Procedure: REVISION OF LEFT UPPER EXTREMITY ARTERIOVENOUS GORETEX GRAFT WITH THROMBECTOMY;  Surgeon: Cherre Robins, MD;  Location: [place];  Service: Vascular;  Laterality: Left;  PERIPHERAL NERVE BLOCK   TOTAL HIP ARTHROPLASTY Left 02/06/2019   Procedure: TOTAL HIP ARTHROPLASTY ANTERIOR APPROACH;  Surgeon: Rod Can, MD;  Location: [place];  Service: Orthopedics;  Laterality: Left;   UMBILICAL HERNIA REPAIR  10/25/2009   open with mesh,  Dr Kimberly [name]   URETER REVISION  04/2011   [facility]   VENTRAL HERNIA REPAIR N/A 12/14/2013   Procedure: LAPAROSCOPIC VENTRAL HERNIA;  Surgeon: Kimberly Hector, MD;  Location: [place];  Service: General;  Laterality: N/A;   [place] (VATS)/WEDGE RESECTION Left 09/01/2014   Procedure: VIDEO ASSISTED THORACOSCOPY (VATS)/WEDGE RESECTION;  Surgeon: [name], MD;  Location: [place];  Service: Thoracic;  Laterality: Left;   VIDEO BRONCHOSCOPY Bilateral 07/05/2014   Procedure: VIDEO BRONCHOSCOPY WITH FLUORO;  Surgeon: [name], MD;  Location: [facility];  Service: Cardiopulmonary;  Laterality:  Bilateral;    Family History  Problem Relation Age of Onset   Coronary artery disease Mother    Emphysema Mother        smoked   Ovarian cancer Mother    Heart disease Mother    Stroke Maternal Uncle    Diabetes Maternal Uncle    Kidney disease Maternal Uncle    Liver disease Father     Social History   Socioeconomic History   Marital status: Married    Spouse name: Not on file   Number of children: 1   Years of education: Not on file   Highest education level: Not on file  Occupational History   Occupation: retired  Tobacco Use   Smoking status: Never    Passive exposure: Never   Smokeless tobacco: Never  Vaping Use   Vaping Use: Never used  Substance and Sexual Activity   Alcohol use: Yes    Comment: ocassional- not weekly   Drug use: No   Sexual activity: Yes    Birth control/protection: Post-menopausal  Other Topics Concern   Not on file  Social History Narrative   Pt lives in [place].  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   Social Determinants of Health   Financial Resource Strain: Not on file  Food Insecurity: Not on file  Transportation Needs: Not on file  Physical Activity: Not on file  Stress: Not on file  Social Connections: Not on file  Intimate Partner Violence: Not on file    Review of systems: Review of Systems  Constitutional: Negative for fever and chills.  HENT: Negative.   Eyes: Negative for blurred vision.  Respiratory: as per HPI  Cardiovascular: Negative for chest pain and palpitations.  Gastrointestinal: Negative for vomiting, diarrhea, blood per rectum. Genitourinary: Negative for dysuria, urgency, frequency and hematuria.  Musculoskeletal: Negative for myalgias, back pain and joint pain.  Skin: Negative for itching and rash.  Neurological: Negative for dizziness, tremors, focal weakness, seizures and loss of consciousness.  Endo/Heme/Allergies: Negative for environmental allergies.   Psychiatric/Behavioral: Negative for depression, suicidal ideas and hallucinations.  All other systems reviewed and are negative.  Physical Exam: Blood pressure 118/64, pulse 62, temperature 97.9 F (36.6 C), temperature source Oral, height 5' 5"  (1.651 m), weight 110 lb 12.8 oz (50.3 kg), SpO2 96 %. Gen:  No acute distress HEENT:  EOMI, sclera anicteric Neck:     No masses; no thyromegaly Lungs:    Clear to auscultation bilaterally; normal respiratory effort CV:         Regular rate and rhythm; no murmurs Abd:      + bowel sounds; soft, non-tender; no palpable masses, no distension Ext:    No edema; adequate peripheral perfusion Skin:      Warm and dry; no rash Neuro: alert and oriented x 3 Psych: normal mood and affect  Data Reviewed: Imaging: CT high-resolution 08/10/2020-pulmonary pattern of upper and middle lobe peribronchial nodularity with metastatic lung attenuation and calcifications.  Right middle lobe nodule, enlarged pulmonary artery trunk.  I have reviewed the images personally.  PFTs: 09/12/2025 FVC 2.67 [94%], FEV1 1.41 [50%], F/F 53, TLC 6.03 [112%], DLCO 15.73 [58%] Severe obstruction with bronchodilator response, moderate diffusion defect  Labs: CBC 09/22/2021-WBC 5.9, eos 4.7%, absolute eosinophil count 277  Labs from nephrology CBC 01/21/2022-WBC 4.07, hemoglobin 9.3, eosinophils 6.1% Metabolic panel 04/27/8591 significant for BUN/creatinine 58/7.81  Assessment:  Severe asthma, chronic cough Currently maintained on triple therapy.  Schedule PFTs She has chronic cough for the past 25 years which has been resistant to treatment.  She has postnasal drip for which she will try Flonase and chlorpheniramine over-the-counter Consider reinitiating GERD therapy.  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  In addition she also has rheumatoid arthritis which can cause interstitial lung disease We will get a high-resolution CT for reevaluation and get records from rheumatology.  Plan/Recommendations: High-resolution CT, PFTs Records from rheumatology Continue inhalers Flonase, chlorpheniramine for postnasal drip.  Marshell Garfinkel MD [facility] 02/08/2022, 9:46 AM  CC: Kimberly Ada, MD

## 2022-02-08 NOTE — Patient Instructions (Signed)
We will schedule you for high-resolution CT and PFTs We will get records from Dr. Gavin Pound, rheumatology  Continue your inhalers as prescribed.  For postnasal drip we will prescribe Flonase nasal spray.  You can use chlorpheniramine or Chlor-Trimeton over-the-counter 3 times a day  Follow-up in 3 months after PFTs

## 2022-02-09 DIAGNOSIS — E039 Hypothyroidism, unspecified: Secondary | ICD-10-CM | POA: Diagnosis not present

## 2022-02-09 DIAGNOSIS — N186 End stage renal disease: Secondary | ICD-10-CM | POA: Diagnosis not present

## 2022-02-09 DIAGNOSIS — D631 Anemia in chronic kidney disease: Secondary | ICD-10-CM | POA: Diagnosis not present

## 2022-02-09 DIAGNOSIS — Z992 Dependence on renal dialysis: Secondary | ICD-10-CM | POA: Diagnosis not present

## 2022-02-09 DIAGNOSIS — N2581 Secondary hyperparathyroidism of renal origin: Secondary | ICD-10-CM | POA: Diagnosis not present

## 2022-02-09 DIAGNOSIS — D689 Coagulation defect, unspecified: Secondary | ICD-10-CM | POA: Diagnosis not present

## 2022-02-12 DIAGNOSIS — N186 End stage renal disease: Secondary | ICD-10-CM | POA: Diagnosis not present

## 2022-02-12 DIAGNOSIS — D631 Anemia in chronic kidney disease: Secondary | ICD-10-CM | POA: Diagnosis not present

## 2022-02-12 DIAGNOSIS — Z992 Dependence on renal dialysis: Secondary | ICD-10-CM | POA: Diagnosis not present

## 2022-02-12 DIAGNOSIS — E039 Hypothyroidism, unspecified: Secondary | ICD-10-CM | POA: Diagnosis not present

## 2022-02-12 DIAGNOSIS — N2581 Secondary hyperparathyroidism of renal origin: Secondary | ICD-10-CM | POA: Diagnosis not present

## 2022-02-12 DIAGNOSIS — D689 Coagulation defect, unspecified: Secondary | ICD-10-CM | POA: Diagnosis not present

## 2022-02-15 ENCOUNTER — Telehealth: Payer: Self-pay | Admitting: Pulmonary Disease

## 2022-02-15 MED ORDER — TIOTROPIUM BROMIDE MONOHYDRATE 18 MCG IN CAPS: 18.0000 ug | ORAL_CAPSULE | Freq: Every day | RESPIRATORY_TRACT | 5 refills | Status: DC

## 2022-02-15 NOTE — Telephone Encounter (Signed)
Called and spoke with patient.  Patient requested refill of Spiriva to [facility].  Requested prescription sent.  Nothing further at this time.

## 2022-02-16 DIAGNOSIS — D631 Anemia in chronic kidney disease: Secondary | ICD-10-CM | POA: Diagnosis not present

## 2022-02-16 DIAGNOSIS — N186 End stage renal disease: Secondary | ICD-10-CM | POA: Diagnosis not present

## 2022-02-16 DIAGNOSIS — E039 Hypothyroidism, unspecified: Secondary | ICD-10-CM | POA: Diagnosis not present

## 2022-02-16 DIAGNOSIS — D689 Coagulation defect, unspecified: Secondary | ICD-10-CM | POA: Diagnosis not present

## 2022-02-16 DIAGNOSIS — N2581 Secondary hyperparathyroidism of renal origin: Secondary | ICD-10-CM | POA: Diagnosis not present

## 2022-02-16 DIAGNOSIS — Z992 Dependence on renal dialysis: Secondary | ICD-10-CM | POA: Diagnosis not present

## 2022-02-19 DIAGNOSIS — D689 Coagulation defect, unspecified: Secondary | ICD-10-CM | POA: Diagnosis not present

## 2022-02-19 DIAGNOSIS — N186 End stage renal disease: Secondary | ICD-10-CM | POA: Diagnosis not present

## 2022-02-19 DIAGNOSIS — E039 Hypothyroidism, unspecified: Secondary | ICD-10-CM | POA: Diagnosis not present

## 2022-02-19 DIAGNOSIS — N2581 Secondary hyperparathyroidism of renal origin: Secondary | ICD-10-CM | POA: Diagnosis not present

## 2022-02-19 DIAGNOSIS — D631 Anemia in chronic kidney disease: Secondary | ICD-10-CM | POA: Diagnosis not present

## 2022-02-19 DIAGNOSIS — Z992 Dependence on renal dialysis: Secondary | ICD-10-CM | POA: Diagnosis not present

## 2022-02-20 DIAGNOSIS — T8612 Kidney transplant failure: Secondary | ICD-10-CM | POA: Diagnosis not present

## 2022-02-20 DIAGNOSIS — N186 End stage renal disease: Secondary | ICD-10-CM | POA: Diagnosis not present

## 2022-02-20 DIAGNOSIS — Z992 Dependence on renal dialysis: Secondary | ICD-10-CM | POA: Diagnosis not present

## 2022-02-21 DIAGNOSIS — D631 Anemia in chronic kidney disease: Secondary | ICD-10-CM | POA: Diagnosis not present

## 2022-02-21 DIAGNOSIS — Z992 Dependence on renal dialysis: Secondary | ICD-10-CM | POA: Diagnosis not present

## 2022-02-21 DIAGNOSIS — D689 Coagulation defect, unspecified: Secondary | ICD-10-CM | POA: Diagnosis not present

## 2022-02-21 DIAGNOSIS — N2581 Secondary hyperparathyroidism of renal origin: Secondary | ICD-10-CM | POA: Diagnosis not present

## 2022-02-21 DIAGNOSIS — R52 Pain, unspecified: Secondary | ICD-10-CM | POA: Diagnosis not present

## 2022-02-21 DIAGNOSIS — N186 End stage renal disease: Secondary | ICD-10-CM | POA: Diagnosis not present

## 2022-02-21 DIAGNOSIS — E039 Hypothyroidism, unspecified: Secondary | ICD-10-CM | POA: Diagnosis not present

## 2022-02-22 ENCOUNTER — Ambulatory Visit
Admission: RE | Admit: 2022-02-22 | Discharge: 2022-02-22 | Disposition: A | Discharge status: Home / Self Care | Payer: Medicare Other | Source: Ambulatory Visit | Attending: Pulmonary Disease | Admitting: Pulmonary Disease

## 2022-02-22 DIAGNOSIS — J45909 Unspecified asthma, uncomplicated: Secondary | ICD-10-CM

## 2022-02-22 DIAGNOSIS — I2510 Atherosclerotic heart disease of native coronary artery without angina pectoris: Secondary | ICD-10-CM | POA: Diagnosis not present

## 2022-02-22 DIAGNOSIS — J90 Pleural effusion, not elsewhere classified: Secondary | ICD-10-CM | POA: Diagnosis not present

## 2022-02-22 DIAGNOSIS — R0602 Shortness of breath: Secondary | ICD-10-CM

## 2022-02-22 DIAGNOSIS — I358 Other nonrheumatic aortic valve disorders: Secondary | ICD-10-CM | POA: Diagnosis not present

## 2022-02-22 DIAGNOSIS — J984 Other disorders of lung: Secondary | ICD-10-CM | POA: Diagnosis not present

## 2022-02-23 DIAGNOSIS — N186 End stage renal disease: Secondary | ICD-10-CM | POA: Diagnosis not present

## 2022-02-23 DIAGNOSIS — D689 Coagulation defect, unspecified: Secondary | ICD-10-CM | POA: Diagnosis not present

## 2022-02-23 DIAGNOSIS — R52 Pain, unspecified: Secondary | ICD-10-CM | POA: Diagnosis not present

## 2022-02-23 DIAGNOSIS — N2581 Secondary hyperparathyroidism of renal origin: Secondary | ICD-10-CM | POA: Diagnosis not present

## 2022-02-23 DIAGNOSIS — E039 Hypothyroidism, unspecified: Secondary | ICD-10-CM | POA: Diagnosis not present

## 2022-02-23 DIAGNOSIS — Z992 Dependence on renal dialysis: Secondary | ICD-10-CM | POA: Diagnosis not present

## 2022-02-23 DIAGNOSIS — D631 Anemia in chronic kidney disease: Secondary | ICD-10-CM | POA: Diagnosis not present

## 2022-02-26 DIAGNOSIS — R52 Pain, unspecified: Secondary | ICD-10-CM | POA: Diagnosis not present

## 2022-02-26 DIAGNOSIS — N2581 Secondary hyperparathyroidism of renal origin: Secondary | ICD-10-CM | POA: Diagnosis not present

## 2022-02-26 DIAGNOSIS — D689 Coagulation defect, unspecified: Secondary | ICD-10-CM | POA: Diagnosis not present

## 2022-02-26 DIAGNOSIS — Z992 Dependence on renal dialysis: Secondary | ICD-10-CM | POA: Diagnosis not present

## 2022-02-26 DIAGNOSIS — N186 End stage renal disease: Secondary | ICD-10-CM | POA: Diagnosis not present

## 2022-02-26 DIAGNOSIS — E039 Hypothyroidism, unspecified: Secondary | ICD-10-CM | POA: Diagnosis not present

## 2022-02-26 DIAGNOSIS — D631 Anemia in chronic kidney disease: Secondary | ICD-10-CM | POA: Diagnosis not present

## 2022-02-28 ENCOUNTER — Telehealth: Payer: Self-pay | Admitting: Pulmonary Disease

## 2022-02-28 DIAGNOSIS — E039 Hypothyroidism, unspecified: Secondary | ICD-10-CM | POA: Diagnosis not present

## 2022-02-28 DIAGNOSIS — N186 End stage renal disease: Secondary | ICD-10-CM | POA: Diagnosis not present

## 2022-02-28 DIAGNOSIS — N2581 Secondary hyperparathyroidism of renal origin: Secondary | ICD-10-CM | POA: Diagnosis not present

## 2022-02-28 DIAGNOSIS — Z992 Dependence on renal dialysis: Secondary | ICD-10-CM | POA: Diagnosis not present

## 2022-02-28 DIAGNOSIS — D689 Coagulation defect, unspecified: Secondary | ICD-10-CM | POA: Diagnosis not present

## 2022-02-28 DIAGNOSIS — D631 Anemia in chronic kidney disease: Secondary | ICD-10-CM | POA: Diagnosis not present

## 2022-02-28 DIAGNOSIS — R52 Pain, unspecified: Secondary | ICD-10-CM | POA: Diagnosis not present

## 2022-03-01 NOTE — Telephone Encounter (Signed)
Called and spoke with Patient.  Dr. Matilde Bash  CT results and recommendations given. Understanding stated. Nothing further at this time.

## 2022-03-01 NOTE — Telephone Encounter (Signed)
Called patient and she is wanting the results of her CT scan that was done on 02/22/22.   Dr Vaughan Browner please advise sir

## 2022-03-01 NOTE — Telephone Encounter (Signed)
CT shows mild interstitial changes which is very similar to her prior CT scan in 2021.  Status is stable lung nodule which is likely benign.  Overall findings are similar to before and reassuring.  We will continue to follow and discussed in detail at return visit after PFTs

## 2022-03-02 DIAGNOSIS — N2581 Secondary hyperparathyroidism of renal origin: Secondary | ICD-10-CM | POA: Diagnosis not present

## 2022-03-02 DIAGNOSIS — R52 Pain, unspecified: Secondary | ICD-10-CM | POA: Diagnosis not present

## 2022-03-02 DIAGNOSIS — D689 Coagulation defect, unspecified: Secondary | ICD-10-CM | POA: Diagnosis not present

## 2022-03-02 DIAGNOSIS — E039 Hypothyroidism, unspecified: Secondary | ICD-10-CM | POA: Diagnosis not present

## 2022-03-02 DIAGNOSIS — D631 Anemia in chronic kidney disease: Secondary | ICD-10-CM | POA: Diagnosis not present

## 2022-03-02 DIAGNOSIS — N186 End stage renal disease: Secondary | ICD-10-CM | POA: Diagnosis not present

## 2022-03-02 DIAGNOSIS — Z992 Dependence on renal dialysis: Secondary | ICD-10-CM | POA: Diagnosis not present

## 2022-03-05 DIAGNOSIS — R52 Pain, unspecified: Secondary | ICD-10-CM | POA: Diagnosis not present

## 2022-03-05 DIAGNOSIS — Z992 Dependence on renal dialysis: Secondary | ICD-10-CM | POA: Diagnosis not present

## 2022-03-05 DIAGNOSIS — E039 Hypothyroidism, unspecified: Secondary | ICD-10-CM | POA: Diagnosis not present

## 2022-03-05 DIAGNOSIS — N186 End stage renal disease: Secondary | ICD-10-CM | POA: Diagnosis not present

## 2022-03-05 DIAGNOSIS — D631 Anemia in chronic kidney disease: Secondary | ICD-10-CM | POA: Diagnosis not present

## 2022-03-05 DIAGNOSIS — D689 Coagulation defect, unspecified: Secondary | ICD-10-CM | POA: Diagnosis not present

## 2022-03-05 DIAGNOSIS — N2581 Secondary hyperparathyroidism of renal origin: Secondary | ICD-10-CM | POA: Diagnosis not present

## 2022-03-07 DIAGNOSIS — Z992 Dependence on renal dialysis: Secondary | ICD-10-CM | POA: Diagnosis not present

## 2022-03-07 DIAGNOSIS — N186 End stage renal disease: Secondary | ICD-10-CM | POA: Diagnosis not present

## 2022-03-07 DIAGNOSIS — D689 Coagulation defect, unspecified: Secondary | ICD-10-CM | POA: Diagnosis not present

## 2022-03-07 DIAGNOSIS — D631 Anemia in chronic kidney disease: Secondary | ICD-10-CM | POA: Diagnosis not present

## 2022-03-07 DIAGNOSIS — R52 Pain, unspecified: Secondary | ICD-10-CM | POA: Diagnosis not present

## 2022-03-07 DIAGNOSIS — E039 Hypothyroidism, unspecified: Secondary | ICD-10-CM | POA: Diagnosis not present

## 2022-03-07 DIAGNOSIS — N2581 Secondary hyperparathyroidism of renal origin: Secondary | ICD-10-CM | POA: Diagnosis not present

## 2022-03-09 DIAGNOSIS — N2581 Secondary hyperparathyroidism of renal origin: Secondary | ICD-10-CM | POA: Diagnosis not present

## 2022-03-09 DIAGNOSIS — E039 Hypothyroidism, unspecified: Secondary | ICD-10-CM | POA: Diagnosis not present

## 2022-03-09 DIAGNOSIS — D689 Coagulation defect, unspecified: Secondary | ICD-10-CM | POA: Diagnosis not present

## 2022-03-09 DIAGNOSIS — N186 End stage renal disease: Secondary | ICD-10-CM | POA: Diagnosis not present

## 2022-03-09 DIAGNOSIS — D631 Anemia in chronic kidney disease: Secondary | ICD-10-CM | POA: Diagnosis not present

## 2022-03-09 DIAGNOSIS — R52 Pain, unspecified: Secondary | ICD-10-CM | POA: Diagnosis not present

## 2022-03-09 DIAGNOSIS — Z992 Dependence on renal dialysis: Secondary | ICD-10-CM | POA: Diagnosis not present

## 2022-03-12 DIAGNOSIS — D631 Anemia in chronic kidney disease: Secondary | ICD-10-CM | POA: Diagnosis not present

## 2022-03-12 DIAGNOSIS — Z992 Dependence on renal dialysis: Secondary | ICD-10-CM | POA: Diagnosis not present

## 2022-03-12 DIAGNOSIS — R52 Pain, unspecified: Secondary | ICD-10-CM | POA: Diagnosis not present

## 2022-03-12 DIAGNOSIS — N2581 Secondary hyperparathyroidism of renal origin: Secondary | ICD-10-CM | POA: Diagnosis not present

## 2022-03-12 DIAGNOSIS — D689 Coagulation defect, unspecified: Secondary | ICD-10-CM | POA: Diagnosis not present

## 2022-03-12 DIAGNOSIS — E039 Hypothyroidism, unspecified: Secondary | ICD-10-CM | POA: Diagnosis not present

## 2022-03-12 DIAGNOSIS — N186 End stage renal disease: Secondary | ICD-10-CM | POA: Diagnosis not present

## 2022-03-14 DIAGNOSIS — N186 End stage renal disease: Secondary | ICD-10-CM | POA: Diagnosis not present

## 2022-03-14 DIAGNOSIS — D631 Anemia in chronic kidney disease: Secondary | ICD-10-CM | POA: Diagnosis not present

## 2022-03-14 DIAGNOSIS — Z992 Dependence on renal dialysis: Secondary | ICD-10-CM | POA: Diagnosis not present

## 2022-03-14 DIAGNOSIS — R52 Pain, unspecified: Secondary | ICD-10-CM | POA: Diagnosis not present

## 2022-03-14 DIAGNOSIS — E039 Hypothyroidism, unspecified: Secondary | ICD-10-CM | POA: Diagnosis not present

## 2022-03-14 DIAGNOSIS — N2581 Secondary hyperparathyroidism of renal origin: Secondary | ICD-10-CM | POA: Diagnosis not present

## 2022-03-14 DIAGNOSIS — D689 Coagulation defect, unspecified: Secondary | ICD-10-CM | POA: Diagnosis not present

## 2022-03-16 DIAGNOSIS — E039 Hypothyroidism, unspecified: Secondary | ICD-10-CM | POA: Diagnosis not present

## 2022-03-16 DIAGNOSIS — D689 Coagulation defect, unspecified: Secondary | ICD-10-CM | POA: Diagnosis not present

## 2022-03-16 DIAGNOSIS — N186 End stage renal disease: Secondary | ICD-10-CM | POA: Diagnosis not present

## 2022-03-16 DIAGNOSIS — N2581 Secondary hyperparathyroidism of renal origin: Secondary | ICD-10-CM | POA: Diagnosis not present

## 2022-03-16 DIAGNOSIS — Z992 Dependence on renal dialysis: Secondary | ICD-10-CM | POA: Diagnosis not present

## 2022-03-16 DIAGNOSIS — R52 Pain, unspecified: Secondary | ICD-10-CM | POA: Diagnosis not present

## 2022-03-16 DIAGNOSIS — D631 Anemia in chronic kidney disease: Secondary | ICD-10-CM | POA: Diagnosis not present

## 2022-03-19 DIAGNOSIS — E039 Hypothyroidism, unspecified: Secondary | ICD-10-CM | POA: Diagnosis not present

## 2022-03-19 DIAGNOSIS — R52 Pain, unspecified: Secondary | ICD-10-CM | POA: Diagnosis not present

## 2022-03-19 DIAGNOSIS — D631 Anemia in chronic kidney disease: Secondary | ICD-10-CM | POA: Diagnosis not present

## 2022-03-19 DIAGNOSIS — N2581 Secondary hyperparathyroidism of renal origin: Secondary | ICD-10-CM | POA: Diagnosis not present

## 2022-03-19 DIAGNOSIS — Z992 Dependence on renal dialysis: Secondary | ICD-10-CM | POA: Diagnosis not present

## 2022-03-19 DIAGNOSIS — D689 Coagulation defect, unspecified: Secondary | ICD-10-CM | POA: Diagnosis not present

## 2022-03-19 DIAGNOSIS — N186 End stage renal disease: Secondary | ICD-10-CM | POA: Diagnosis not present

## 2022-03-21 DIAGNOSIS — N2581 Secondary hyperparathyroidism of renal origin: Secondary | ICD-10-CM | POA: Diagnosis not present

## 2022-03-21 DIAGNOSIS — E039 Hypothyroidism, unspecified: Secondary | ICD-10-CM | POA: Diagnosis not present

## 2022-03-21 DIAGNOSIS — Z992 Dependence on renal dialysis: Secondary | ICD-10-CM | POA: Diagnosis not present

## 2022-03-21 DIAGNOSIS — R52 Pain, unspecified: Secondary | ICD-10-CM | POA: Diagnosis not present

## 2022-03-21 DIAGNOSIS — D689 Coagulation defect, unspecified: Secondary | ICD-10-CM | POA: Diagnosis not present

## 2022-03-21 DIAGNOSIS — N186 End stage renal disease: Secondary | ICD-10-CM | POA: Diagnosis not present

## 2022-03-21 DIAGNOSIS — D631 Anemia in chronic kidney disease: Secondary | ICD-10-CM | POA: Diagnosis not present

## 2022-03-22 DIAGNOSIS — Z992 Dependence on renal dialysis: Secondary | ICD-10-CM | POA: Diagnosis not present

## 2022-03-22 DIAGNOSIS — N186 End stage renal disease: Secondary | ICD-10-CM | POA: Diagnosis not present

## 2022-03-22 DIAGNOSIS — T8612 Kidney transplant failure: Secondary | ICD-10-CM | POA: Diagnosis not present

## 2022-03-23 DIAGNOSIS — E1129 Type 2 diabetes mellitus with other diabetic kidney complication: Secondary | ICD-10-CM | POA: Diagnosis not present

## 2022-03-23 DIAGNOSIS — Z992 Dependence on renal dialysis: Secondary | ICD-10-CM | POA: Diagnosis not present

## 2022-03-23 DIAGNOSIS — N2581 Secondary hyperparathyroidism of renal origin: Secondary | ICD-10-CM | POA: Diagnosis not present

## 2022-03-23 DIAGNOSIS — N186 End stage renal disease: Secondary | ICD-10-CM | POA: Diagnosis not present

## 2022-03-23 DIAGNOSIS — D631 Anemia in chronic kidney disease: Secondary | ICD-10-CM | POA: Diagnosis not present

## 2022-03-23 DIAGNOSIS — D689 Coagulation defect, unspecified: Secondary | ICD-10-CM | POA: Diagnosis not present

## 2022-03-23 DIAGNOSIS — E039 Hypothyroidism, unspecified: Secondary | ICD-10-CM | POA: Diagnosis not present

## 2022-03-26 DIAGNOSIS — D689 Coagulation defect, unspecified: Secondary | ICD-10-CM | POA: Diagnosis not present

## 2022-03-26 DIAGNOSIS — N2581 Secondary hyperparathyroidism of renal origin: Secondary | ICD-10-CM | POA: Diagnosis not present

## 2022-03-26 DIAGNOSIS — N186 End stage renal disease: Secondary | ICD-10-CM | POA: Diagnosis not present

## 2022-03-26 DIAGNOSIS — D631 Anemia in chronic kidney disease: Secondary | ICD-10-CM | POA: Diagnosis not present

## 2022-03-26 DIAGNOSIS — E039 Hypothyroidism, unspecified: Secondary | ICD-10-CM | POA: Diagnosis not present

## 2022-03-26 DIAGNOSIS — E1129 Type 2 diabetes mellitus with other diabetic kidney complication: Secondary | ICD-10-CM | POA: Diagnosis not present

## 2022-03-26 DIAGNOSIS — Z992 Dependence on renal dialysis: Secondary | ICD-10-CM | POA: Diagnosis not present

## 2022-03-28 DIAGNOSIS — N2581 Secondary hyperparathyroidism of renal origin: Secondary | ICD-10-CM | POA: Diagnosis not present

## 2022-03-28 DIAGNOSIS — N186 End stage renal disease: Secondary | ICD-10-CM | POA: Diagnosis not present

## 2022-03-28 DIAGNOSIS — E1129 Type 2 diabetes mellitus with other diabetic kidney complication: Secondary | ICD-10-CM | POA: Diagnosis not present

## 2022-03-28 DIAGNOSIS — D689 Coagulation defect, unspecified: Secondary | ICD-10-CM | POA: Diagnosis not present

## 2022-03-28 DIAGNOSIS — Z992 Dependence on renal dialysis: Secondary | ICD-10-CM | POA: Diagnosis not present

## 2022-03-28 DIAGNOSIS — D631 Anemia in chronic kidney disease: Secondary | ICD-10-CM | POA: Diagnosis not present

## 2022-03-28 DIAGNOSIS — E039 Hypothyroidism, unspecified: Secondary | ICD-10-CM | POA: Diagnosis not present

## 2022-03-30 DIAGNOSIS — D631 Anemia in chronic kidney disease: Secondary | ICD-10-CM | POA: Diagnosis not present

## 2022-03-30 DIAGNOSIS — E1129 Type 2 diabetes mellitus with other diabetic kidney complication: Secondary | ICD-10-CM | POA: Diagnosis not present

## 2022-03-30 DIAGNOSIS — N186 End stage renal disease: Secondary | ICD-10-CM | POA: Diagnosis not present

## 2022-03-30 DIAGNOSIS — D689 Coagulation defect, unspecified: Secondary | ICD-10-CM | POA: Diagnosis not present

## 2022-03-30 DIAGNOSIS — E039 Hypothyroidism, unspecified: Secondary | ICD-10-CM | POA: Diagnosis not present

## 2022-03-30 DIAGNOSIS — Z992 Dependence on renal dialysis: Secondary | ICD-10-CM | POA: Diagnosis not present

## 2022-03-30 DIAGNOSIS — N2581 Secondary hyperparathyroidism of renal origin: Secondary | ICD-10-CM | POA: Diagnosis not present

## 2022-04-02 DIAGNOSIS — D689 Coagulation defect, unspecified: Secondary | ICD-10-CM | POA: Diagnosis not present

## 2022-04-02 DIAGNOSIS — E039 Hypothyroidism, unspecified: Secondary | ICD-10-CM | POA: Diagnosis not present

## 2022-04-02 DIAGNOSIS — N2581 Secondary hyperparathyroidism of renal origin: Secondary | ICD-10-CM | POA: Diagnosis not present

## 2022-04-02 DIAGNOSIS — Z992 Dependence on renal dialysis: Secondary | ICD-10-CM | POA: Diagnosis not present

## 2022-04-02 DIAGNOSIS — N186 End stage renal disease: Secondary | ICD-10-CM | POA: Diagnosis not present

## 2022-04-02 DIAGNOSIS — D631 Anemia in chronic kidney disease: Secondary | ICD-10-CM | POA: Diagnosis not present

## 2022-04-02 DIAGNOSIS — E1129 Type 2 diabetes mellitus with other diabetic kidney complication: Secondary | ICD-10-CM | POA: Diagnosis not present

## 2022-04-04 DIAGNOSIS — D689 Coagulation defect, unspecified: Secondary | ICD-10-CM | POA: Diagnosis not present

## 2022-04-04 DIAGNOSIS — E039 Hypothyroidism, unspecified: Secondary | ICD-10-CM | POA: Diagnosis not present

## 2022-04-04 DIAGNOSIS — Z992 Dependence on renal dialysis: Secondary | ICD-10-CM | POA: Diagnosis not present

## 2022-04-04 DIAGNOSIS — N186 End stage renal disease: Secondary | ICD-10-CM | POA: Diagnosis not present

## 2022-04-04 DIAGNOSIS — D631 Anemia in chronic kidney disease: Secondary | ICD-10-CM | POA: Diagnosis not present

## 2022-04-04 DIAGNOSIS — N2581 Secondary hyperparathyroidism of renal origin: Secondary | ICD-10-CM | POA: Diagnosis not present

## 2022-04-04 DIAGNOSIS — E1129 Type 2 diabetes mellitus with other diabetic kidney complication: Secondary | ICD-10-CM | POA: Diagnosis not present

## 2022-04-06 DIAGNOSIS — Z992 Dependence on renal dialysis: Secondary | ICD-10-CM | POA: Diagnosis not present

## 2022-04-06 DIAGNOSIS — N186 End stage renal disease: Secondary | ICD-10-CM | POA: Diagnosis not present

## 2022-04-06 DIAGNOSIS — E039 Hypothyroidism, unspecified: Secondary | ICD-10-CM | POA: Diagnosis not present

## 2022-04-06 DIAGNOSIS — D689 Coagulation defect, unspecified: Secondary | ICD-10-CM | POA: Diagnosis not present

## 2022-04-06 DIAGNOSIS — N2581 Secondary hyperparathyroidism of renal origin: Secondary | ICD-10-CM | POA: Diagnosis not present

## 2022-04-06 DIAGNOSIS — E1129 Type 2 diabetes mellitus with other diabetic kidney complication: Secondary | ICD-10-CM | POA: Diagnosis not present

## 2022-04-06 DIAGNOSIS — D631 Anemia in chronic kidney disease: Secondary | ICD-10-CM | POA: Diagnosis not present

## 2022-04-09 DIAGNOSIS — N2581 Secondary hyperparathyroidism of renal origin: Secondary | ICD-10-CM | POA: Diagnosis not present

## 2022-04-09 DIAGNOSIS — E039 Hypothyroidism, unspecified: Secondary | ICD-10-CM | POA: Diagnosis not present

## 2022-04-09 DIAGNOSIS — D689 Coagulation defect, unspecified: Secondary | ICD-10-CM | POA: Diagnosis not present

## 2022-04-09 DIAGNOSIS — N186 End stage renal disease: Secondary | ICD-10-CM | POA: Diagnosis not present

## 2022-04-09 DIAGNOSIS — D631 Anemia in chronic kidney disease: Secondary | ICD-10-CM | POA: Diagnosis not present

## 2022-04-09 DIAGNOSIS — Z992 Dependence on renal dialysis: Secondary | ICD-10-CM | POA: Diagnosis not present

## 2022-04-09 DIAGNOSIS — E1129 Type 2 diabetes mellitus with other diabetic kidney complication: Secondary | ICD-10-CM | POA: Diagnosis not present

## 2022-04-11 DIAGNOSIS — Z992 Dependence on renal dialysis: Secondary | ICD-10-CM | POA: Diagnosis not present

## 2022-04-11 DIAGNOSIS — N2581 Secondary hyperparathyroidism of renal origin: Secondary | ICD-10-CM | POA: Diagnosis not present

## 2022-04-11 DIAGNOSIS — N186 End stage renal disease: Secondary | ICD-10-CM | POA: Diagnosis not present

## 2022-04-11 DIAGNOSIS — E1129 Type 2 diabetes mellitus with other diabetic kidney complication: Secondary | ICD-10-CM | POA: Diagnosis not present

## 2022-04-11 DIAGNOSIS — D631 Anemia in chronic kidney disease: Secondary | ICD-10-CM | POA: Diagnosis not present

## 2022-04-11 DIAGNOSIS — E039 Hypothyroidism, unspecified: Secondary | ICD-10-CM | POA: Diagnosis not present

## 2022-04-11 DIAGNOSIS — D689 Coagulation defect, unspecified: Secondary | ICD-10-CM | POA: Diagnosis not present

## 2022-04-13 DIAGNOSIS — N2581 Secondary hyperparathyroidism of renal origin: Secondary | ICD-10-CM | POA: Diagnosis not present

## 2022-04-13 DIAGNOSIS — E039 Hypothyroidism, unspecified: Secondary | ICD-10-CM | POA: Diagnosis not present

## 2022-04-13 DIAGNOSIS — D689 Coagulation defect, unspecified: Secondary | ICD-10-CM | POA: Diagnosis not present

## 2022-04-13 DIAGNOSIS — Z992 Dependence on renal dialysis: Secondary | ICD-10-CM | POA: Diagnosis not present

## 2022-04-13 DIAGNOSIS — D631 Anemia in chronic kidney disease: Secondary | ICD-10-CM | POA: Diagnosis not present

## 2022-04-13 DIAGNOSIS — E1129 Type 2 diabetes mellitus with other diabetic kidney complication: Secondary | ICD-10-CM | POA: Diagnosis not present

## 2022-04-13 DIAGNOSIS — N186 End stage renal disease: Secondary | ICD-10-CM | POA: Diagnosis not present

## 2022-04-16 DIAGNOSIS — Z992 Dependence on renal dialysis: Secondary | ICD-10-CM | POA: Diagnosis not present

## 2022-04-16 DIAGNOSIS — E1129 Type 2 diabetes mellitus with other diabetic kidney complication: Secondary | ICD-10-CM | POA: Diagnosis not present

## 2022-04-16 DIAGNOSIS — E039 Hypothyroidism, unspecified: Secondary | ICD-10-CM | POA: Diagnosis not present

## 2022-04-16 DIAGNOSIS — D631 Anemia in chronic kidney disease: Secondary | ICD-10-CM | POA: Diagnosis not present

## 2022-04-16 DIAGNOSIS — N186 End stage renal disease: Secondary | ICD-10-CM | POA: Diagnosis not present

## 2022-04-16 DIAGNOSIS — D689 Coagulation defect, unspecified: Secondary | ICD-10-CM | POA: Diagnosis not present

## 2022-04-16 DIAGNOSIS — N2581 Secondary hyperparathyroidism of renal origin: Secondary | ICD-10-CM | POA: Diagnosis not present

## 2022-04-17 DIAGNOSIS — N186 End stage renal disease: Secondary | ICD-10-CM | POA: Diagnosis not present

## 2022-04-17 DIAGNOSIS — M05741 Rheumatoid arthritis with rheumatoid factor of right hand without organ or systems involvement: Secondary | ICD-10-CM | POA: Diagnosis not present

## 2022-04-17 DIAGNOSIS — J449 Chronic obstructive pulmonary disease, unspecified: Secondary | ICD-10-CM | POA: Diagnosis not present

## 2022-04-17 DIAGNOSIS — E039 Hypothyroidism, unspecified: Secondary | ICD-10-CM | POA: Diagnosis not present

## 2022-04-17 DIAGNOSIS — I10 Essential (primary) hypertension: Secondary | ICD-10-CM | POA: Diagnosis not present

## 2022-04-18 DIAGNOSIS — D631 Anemia in chronic kidney disease: Secondary | ICD-10-CM | POA: Diagnosis not present

## 2022-04-18 DIAGNOSIS — N2581 Secondary hyperparathyroidism of renal origin: Secondary | ICD-10-CM | POA: Diagnosis not present

## 2022-04-18 DIAGNOSIS — E039 Hypothyroidism, unspecified: Secondary | ICD-10-CM | POA: Diagnosis not present

## 2022-04-18 DIAGNOSIS — D689 Coagulation defect, unspecified: Secondary | ICD-10-CM | POA: Diagnosis not present

## 2022-04-18 DIAGNOSIS — Z992 Dependence on renal dialysis: Secondary | ICD-10-CM | POA: Diagnosis not present

## 2022-04-18 DIAGNOSIS — N186 End stage renal disease: Secondary | ICD-10-CM | POA: Diagnosis not present

## 2022-04-18 DIAGNOSIS — E1129 Type 2 diabetes mellitus with other diabetic kidney complication: Secondary | ICD-10-CM | POA: Diagnosis not present

## 2022-04-20 DIAGNOSIS — D631 Anemia in chronic kidney disease: Secondary | ICD-10-CM | POA: Diagnosis not present

## 2022-04-20 DIAGNOSIS — D689 Coagulation defect, unspecified: Secondary | ICD-10-CM | POA: Diagnosis not present

## 2022-04-20 DIAGNOSIS — E039 Hypothyroidism, unspecified: Secondary | ICD-10-CM | POA: Diagnosis not present

## 2022-04-20 DIAGNOSIS — E1129 Type 2 diabetes mellitus with other diabetic kidney complication: Secondary | ICD-10-CM | POA: Diagnosis not present

## 2022-04-20 DIAGNOSIS — Z992 Dependence on renal dialysis: Secondary | ICD-10-CM | POA: Diagnosis not present

## 2022-04-20 DIAGNOSIS — N2581 Secondary hyperparathyroidism of renal origin: Secondary | ICD-10-CM | POA: Diagnosis not present

## 2022-04-20 DIAGNOSIS — N186 End stage renal disease: Secondary | ICD-10-CM | POA: Diagnosis not present

## 2022-04-22 DIAGNOSIS — Z992 Dependence on renal dialysis: Secondary | ICD-10-CM | POA: Diagnosis not present

## 2022-04-22 DIAGNOSIS — T8612 Kidney transplant failure: Secondary | ICD-10-CM | POA: Diagnosis not present

## 2022-04-22 DIAGNOSIS — N186 End stage renal disease: Secondary | ICD-10-CM | POA: Diagnosis not present

## 2022-04-23 DIAGNOSIS — Z992 Dependence on renal dialysis: Secondary | ICD-10-CM | POA: Diagnosis not present

## 2022-04-23 DIAGNOSIS — D689 Coagulation defect, unspecified: Secondary | ICD-10-CM | POA: Diagnosis not present

## 2022-04-23 DIAGNOSIS — N2581 Secondary hyperparathyroidism of renal origin: Secondary | ICD-10-CM | POA: Diagnosis not present

## 2022-04-23 DIAGNOSIS — D631 Anemia in chronic kidney disease: Secondary | ICD-10-CM | POA: Diagnosis not present

## 2022-04-23 DIAGNOSIS — N186 End stage renal disease: Secondary | ICD-10-CM | POA: Diagnosis not present

## 2022-04-25 DIAGNOSIS — D689 Coagulation defect, unspecified: Secondary | ICD-10-CM | POA: Diagnosis not present

## 2022-04-25 DIAGNOSIS — N2581 Secondary hyperparathyroidism of renal origin: Secondary | ICD-10-CM | POA: Diagnosis not present

## 2022-04-25 DIAGNOSIS — D631 Anemia in chronic kidney disease: Secondary | ICD-10-CM | POA: Diagnosis not present

## 2022-04-25 DIAGNOSIS — N186 End stage renal disease: Secondary | ICD-10-CM | POA: Diagnosis not present

## 2022-04-25 DIAGNOSIS — Z992 Dependence on renal dialysis: Secondary | ICD-10-CM | POA: Diagnosis not present

## 2022-04-27 DIAGNOSIS — Z992 Dependence on renal dialysis: Secondary | ICD-10-CM | POA: Diagnosis not present

## 2022-04-27 DIAGNOSIS — D631 Anemia in chronic kidney disease: Secondary | ICD-10-CM | POA: Diagnosis not present

## 2022-04-27 DIAGNOSIS — N2581 Secondary hyperparathyroidism of renal origin: Secondary | ICD-10-CM | POA: Diagnosis not present

## 2022-04-27 DIAGNOSIS — D689 Coagulation defect, unspecified: Secondary | ICD-10-CM | POA: Diagnosis not present

## 2022-04-27 DIAGNOSIS — N186 End stage renal disease: Secondary | ICD-10-CM | POA: Diagnosis not present

## 2022-04-30 DIAGNOSIS — N2581 Secondary hyperparathyroidism of renal origin: Secondary | ICD-10-CM | POA: Diagnosis not present

## 2022-04-30 DIAGNOSIS — D689 Coagulation defect, unspecified: Secondary | ICD-10-CM | POA: Diagnosis not present

## 2022-04-30 DIAGNOSIS — N186 End stage renal disease: Secondary | ICD-10-CM | POA: Diagnosis not present

## 2022-04-30 DIAGNOSIS — Z992 Dependence on renal dialysis: Secondary | ICD-10-CM | POA: Diagnosis not present

## 2022-04-30 DIAGNOSIS — D631 Anemia in chronic kidney disease: Secondary | ICD-10-CM | POA: Diagnosis not present

## 2022-05-02 DIAGNOSIS — N2581 Secondary hyperparathyroidism of renal origin: Secondary | ICD-10-CM | POA: Diagnosis not present

## 2022-05-02 DIAGNOSIS — D689 Coagulation defect, unspecified: Secondary | ICD-10-CM | POA: Diagnosis not present

## 2022-05-02 DIAGNOSIS — N186 End stage renal disease: Secondary | ICD-10-CM | POA: Diagnosis not present

## 2022-05-02 DIAGNOSIS — Z992 Dependence on renal dialysis: Secondary | ICD-10-CM | POA: Diagnosis not present

## 2022-05-02 DIAGNOSIS — D631 Anemia in chronic kidney disease: Secondary | ICD-10-CM | POA: Diagnosis not present

## 2022-05-04 DIAGNOSIS — D689 Coagulation defect, unspecified: Secondary | ICD-10-CM | POA: Diagnosis not present

## 2022-05-04 DIAGNOSIS — N186 End stage renal disease: Secondary | ICD-10-CM | POA: Diagnosis not present

## 2022-05-04 DIAGNOSIS — D631 Anemia in chronic kidney disease: Secondary | ICD-10-CM | POA: Diagnosis not present

## 2022-05-04 DIAGNOSIS — N2581 Secondary hyperparathyroidism of renal origin: Secondary | ICD-10-CM | POA: Diagnosis not present

## 2022-05-04 DIAGNOSIS — Z992 Dependence on renal dialysis: Secondary | ICD-10-CM | POA: Diagnosis not present

## 2022-05-07 DIAGNOSIS — N186 End stage renal disease: Secondary | ICD-10-CM | POA: Diagnosis not present

## 2022-05-07 DIAGNOSIS — N2581 Secondary hyperparathyroidism of renal origin: Secondary | ICD-10-CM | POA: Diagnosis not present

## 2022-05-07 DIAGNOSIS — D631 Anemia in chronic kidney disease: Secondary | ICD-10-CM | POA: Diagnosis not present

## 2022-05-07 DIAGNOSIS — D689 Coagulation defect, unspecified: Secondary | ICD-10-CM | POA: Diagnosis not present

## 2022-05-07 DIAGNOSIS — Z992 Dependence on renal dialysis: Secondary | ICD-10-CM | POA: Diagnosis not present

## 2022-05-08 DIAGNOSIS — M1120 Other chondrocalcinosis, unspecified site: Secondary | ICD-10-CM | POA: Diagnosis not present

## 2022-05-08 DIAGNOSIS — M0579 Rheumatoid arthritis with rheumatoid factor of multiple sites without organ or systems involvement: Secondary | ICD-10-CM | POA: Diagnosis not present

## 2022-05-08 DIAGNOSIS — E340 Carcinoid syndrome: Secondary | ICD-10-CM | POA: Diagnosis not present

## 2022-05-08 DIAGNOSIS — N186 End stage renal disease: Secondary | ICD-10-CM | POA: Diagnosis not present

## 2022-05-09 DIAGNOSIS — D631 Anemia in chronic kidney disease: Secondary | ICD-10-CM | POA: Diagnosis not present

## 2022-05-09 DIAGNOSIS — N6312 Unspecified lump in the right breast, upper inner quadrant: Secondary | ICD-10-CM | POA: Diagnosis not present

## 2022-05-09 DIAGNOSIS — N2581 Secondary hyperparathyroidism of renal origin: Secondary | ICD-10-CM | POA: Diagnosis not present

## 2022-05-09 DIAGNOSIS — Z992 Dependence on renal dialysis: Secondary | ICD-10-CM | POA: Diagnosis not present

## 2022-05-09 DIAGNOSIS — D689 Coagulation defect, unspecified: Secondary | ICD-10-CM | POA: Diagnosis not present

## 2022-05-09 DIAGNOSIS — N186 End stage renal disease: Secondary | ICD-10-CM | POA: Diagnosis not present

## 2022-05-11 DIAGNOSIS — Z992 Dependence on renal dialysis: Secondary | ICD-10-CM | POA: Diagnosis not present

## 2022-05-11 DIAGNOSIS — D689 Coagulation defect, unspecified: Secondary | ICD-10-CM | POA: Diagnosis not present

## 2022-05-11 DIAGNOSIS — D631 Anemia in chronic kidney disease: Secondary | ICD-10-CM | POA: Diagnosis not present

## 2022-05-11 DIAGNOSIS — N2581 Secondary hyperparathyroidism of renal origin: Secondary | ICD-10-CM | POA: Diagnosis not present

## 2022-05-11 DIAGNOSIS — N186 End stage renal disease: Secondary | ICD-10-CM | POA: Diagnosis not present

## 2022-05-13 ENCOUNTER — Ambulatory Visit (INDEPENDENT_AMBULATORY_CARE_PROVIDER_SITE_OTHER): Payer: Medicare Other | Admitting: Pulmonary Disease

## 2022-05-13 ENCOUNTER — Encounter: Payer: Self-pay | Admitting: Pulmonary Disease

## 2022-05-13 VITALS — BP 118/62 | HR 70 | Temp 97.8°F | Ht 65.0 in | Wt 114.6 lb

## 2022-05-13 DIAGNOSIS — J45909 Unspecified asthma, uncomplicated: Secondary | ICD-10-CM

## 2022-05-13 DIAGNOSIS — R053 Chronic cough: Secondary | ICD-10-CM

## 2022-05-13 LAB — PULMONARY FUNCTION TEST
DL/VA % pred: 78 %
DL/VA: 3.26 ml/min/mmHg/L
DLCO cor % pred: 55 %
DLCO cor: 11.41 ml/min/mmHg
DLCO unc % pred: 55 %
DLCO unc: 11.41 ml/min/mmHg
FEF 25-75 Post: 0.62 L/sec
FEF 25-75 Pre: 0.43 L/sec
FEF2575-%Change-Post: 43 %
FEF2575-%Pred-Post: 28 %
FEF2575-%Pred-Pre: 19 %
FEV1-%Change-Post: 12 %
FEV1-%Pred-Post: 46 %
FEV1-%Pred-Pre: 40 %
FEV1-Post: 1.16 L
FEV1-Pre: 1.03 L
FEV1FVC-%Change-Post: 4 %
FEV1FVC-%Pred-Pre: 69 %
FEV6-%Change-Post: 9 %
FEV6-%Pred-Post: 64 %
FEV6-%Pred-Pre: 59 %
FEV6-Post: 2.04 L
FEV6-Pre: 1.86 L
FEV6FVC-%Change-Post: 1 %
FEV6FVC-%Pred-Post: 103 %
FEV6FVC-%Pred-Pre: 101 %
FVC-%Change-Post: 7 %
FVC-%Pred-Post: 62 %
FVC-%Pred-Pre: 58 %
FVC-Post: 2.06 L
FVC-Pre: 1.92 L
Post FEV1/FVC ratio: 56 %
Post FEV6/FVC ratio: 99 %
Pre FEV1/FVC ratio: 54 %
Pre FEV6/FVC Ratio: 97 %
RV % pred: 174 %
RV: 3.73 L
TLC % pred: 109 %
TLC: 5.67 L

## 2022-05-13 MED ORDER — BREZTRI AEROSPHERE 160-9-4.8 MCG/ACT IN AERO: 2.0000 | INHALATION_SPRAY | Freq: Two times a day (BID) | RESPIRATORY_TRACT | 0 refills | Status: DC

## 2022-05-13 NOTE — Patient Instructions (Signed)
Glad you are stable with your breathing We will give a sample of a different inhaler called breztri which is similar to Symbicort.  Let us know if this works better and we can send in a prescription We will make referral to Dr. Joya Gaskins, ENT at [facility] for evaluation of chronic cough Return to clinic in 6 months.

## 2022-05-13 NOTE — Progress Notes (Signed)
Kimberly [name]    [id number]    May 14, 1956  Primary Care Physician:[name], [name], MD  Referring Physician: [name], [place],  [place] [address]  Chief complaint: Chronic cough, carcinoid of the lung   HPI: 66 year old with a history of chronic cough, severe persistent asthma, benign carcinoid of the lung, irritable larynx.  Previously followed at [facility].  She has history of end-stage renal disease as a result of IgA nephropathy and had a transplant in Dec 13, 2009 which subsequently failed in 12/13/13 and is currently on hemodialysis  Her pulmonary history consists of chronic cough dating back to age of 55 which has been resistant to multiple treatments including treatment for postnasal drip, GERD.  She has severe asthma treated with Spiriva and Symbicort.  Biologics have been discussed with her in the past but she is not on those at present.  There is a question of DIPNECH syndrome as she has history of benign carcinoid and at some point was given octreotide injections at [facility] but stopped after 3 weeks due to abdominal pain and diarrhea.  She was seen at [facility] pulmonary in 12/13/2013 for left apical mass for left apical mass measuring 4.1 cm.  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  She sees Dr. Gavin Pound for management of rheumatoid arthritis.  Pets: She had a dog that recently died in 12/13/20 Occupation: Retired Chief Strategy Officer Exposures: No mold, hot tub, Customer service manager.  No feather pillows or comforters Smoking history: Never smoker Travel history: Originally from [place].  No significant travel history Relevant family history: Mother died of emphysema.  She was a smoker.  Interim  history: She is here for review of CT and PFTs.  States that breathing is stable.  Continues to have significant cough which is limiting her quality of life.  Outpatient Encounter Medications as of 05/13/2022  Medication Sig   albuterol (VENTOLIN HFA) 108 (90 Base) MCG/ACT inhaler Inhale 2 puffs into the lungs every 6 (six) hours as needed for shortness of breath or wheezing (Asthma).   amLODipine (NORVASC) 10 MG tablet Take 10 mg by mouth daily.   budesonide-formoterol (SYMBICORT) 160-4.5 MCG/ACT inhaler Inhale 2 puffs into the lungs 2 (two) times daily.   calcium acetate (PHOSLO) 667 MG capsule Take 1,334 mg by mouth 3 (three) times daily with meals.   cinacalcet (SENSIPAR) 30 MG tablet Take 30 mg by mouth daily after supper.   EPOETIN ALFA IJ Inject into the skin See admin instructions. Inject  subcutaneously at dialysis as needed for low hemoglobin   Garlic 2595 MG CAPS Take 1,000 mg by mouth in the morning, at noon, and at bedtime.   hydroxychloroquine (PLAQUENIL) 200 MG tablet Take 200 mg by mouth daily.   levothyroxine (SYNTHROID, LEVOTHROID) 50 MCG tablet Take 50-75 mcg by mouth See admin instructions. Take  1 1/2 tablets (75 mcg) by mouth daily on 12-13-2022 and Thursday nights, take 1 tablet (50 mcg) by mouth daily on all other nights of the week   losartan (COZAAR) 100 MG tablet Take 100 mg by mouth daily.   Multiple Vitamins-Minerals (ZINC PO) Take 22 mg by mouth daily.   OVER THE COUNTER MEDICATION Take 4 capsules by mouth 2 (two)  times a day. Juice plus - 1 fruit, 1 vegetable, 1 berries, 1 omega - twice daily   predniSONE (DELTASONE) 5 MG tablet Take 5 mg by mouth daily with breakfast.   Probiotic Product (PROBIOTIC DAILY PO) Take 1 tablet by mouth daily.   PSYLLIUM PO Take 3 capsules by mouth daily at 2 PM.    tiotropium (SPIRIVA) 18 MCG inhalation capsule Place 1 capsule (18 mcg total) into inhaler and inhale daily.   traZODone (DESYREL) 50 MG tablet Take 50 mg by mouth at bedtime as  needed for sleep.   No facility-administered encounter medications on file as of 05/13/2022.    Physical Exam: Blood pressure 118/62, pulse 70, temperature 97.8 F (36.6 C), temperature source Oral, height 5' 5"  (1.651 m), weight 114 lb 9.6 oz (52 kg), SpO2 99 %. Gen:      No acute distress HEENT:  EOMI, sclera anicteric Neck:     No masses; no thyromegaly Lungs:    Clear to auscultation bilaterally; normal respiratory effort CV:         Regular rate and rhythm; no murmurs Abd:      + bowel sounds; soft, non-tender; no palpable masses, no distension Ext:    No edema; adequate peripheral perfusion Skin:      Warm and dry; no rash Neuro: alert and oriented x 3 Psych: normal mood and affect   Data Reviewed: Imaging: CT high-resolution 08/10/2020-pulmonary pattern of upper and middle lobe peribronchial nodularity with mosaic lung attenuation and calcifications.  Right middle lobe nodule, enlarged pulmonary artery trunk.    CT high-resolution 02/22/2022-stable pattern of upper and middle lobe nodularity, mosaic lung attenuation.  I have reviewed the images personally.  PFTs: 09/12/2025 FVC 2.67 [94%], FEV1 1.41 [50%], F/F 53, TLC 6.03 [112%], DLCO 15.73 [58%] Severe obstruction with bronchodilator response, moderate diffusion defect  05/13/2022 FVC 2.06 [60%], FEV1 1.16 [46%], F/F 56, TLC 5.67 [99%], DLCO 11.41 [55%] Severe obstruction, moderate diffusion defect  Labs: CBC 09/22/2021-WBC 5.9, eos 4.7%, absolute eosinophil count 277  Labs from nephrology CBC 01/21/2022-WBC 4.07, hemoglobin 9.3, eosinophils 1.1% Metabolic panel 0/11/1592 significant for BUN/creatinine 58/7.81  Assessment:  Severe asthma, chronic cough She has chronic cough for the past 25 years which has been resistant to treatment.  She has postnasal drip for which she will try Flonase and chlorpheniramine over-the-counter Consider reinitiating GERD therapy. Change symbicort to breztri  Can consider Neurontin for  neurogenic cough. As a first step I will make referral to Dr. Joya Gaskins, ENT at [facility] for evaluation of irritable larynx.  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  In addition she also has rheumatoid arthritis which can cause interstitial lung disease  CT reviewed with stable findings.  This is not typical of rheumatoid arthritis and currently present Syndrome.  We will continue to follow for now as treatment consists of inhalers.  She has not tolerated octreotide in the past.  Plan/Recommendations: Continue inhalers Flonase, chlorpheniramine for postnasal drip. Referral to ENT  Marshell Garfinkel MD [facility] 05/13/2022, 10:00 AM  CC: [name], MD

## 2022-05-13 NOTE — Addendum Note (Signed)
Addended by: Elton Sin on: 05/13/2022 11:11 AM   Modules accepted: Orders

## 2022-05-13 NOTE — Progress Notes (Signed)
PFT done today. 

## 2022-05-14 ENCOUNTER — Telehealth: Payer: Self-pay | Admitting: Pulmonary Disease

## 2022-05-14 DIAGNOSIS — N186 End stage renal disease: Secondary | ICD-10-CM | POA: Diagnosis not present

## 2022-05-14 DIAGNOSIS — N2581 Secondary hyperparathyroidism of renal origin: Secondary | ICD-10-CM | POA: Diagnosis not present

## 2022-05-14 DIAGNOSIS — D689 Coagulation defect, unspecified: Secondary | ICD-10-CM | POA: Diagnosis not present

## 2022-05-14 DIAGNOSIS — D631 Anemia in chronic kidney disease: Secondary | ICD-10-CM | POA: Diagnosis not present

## 2022-05-14 DIAGNOSIS — Z992 Dependence on renal dialysis: Secondary | ICD-10-CM | POA: Diagnosis not present

## 2022-05-14 NOTE — Telephone Encounter (Signed)
Please advise on pt's concerns of breztri

## 2022-05-16 DIAGNOSIS — D631 Anemia in chronic kidney disease: Secondary | ICD-10-CM | POA: Diagnosis not present

## 2022-05-16 DIAGNOSIS — N186 End stage renal disease: Secondary | ICD-10-CM | POA: Diagnosis not present

## 2022-05-16 DIAGNOSIS — N2581 Secondary hyperparathyroidism of renal origin: Secondary | ICD-10-CM | POA: Diagnosis not present

## 2022-05-16 DIAGNOSIS — Z992 Dependence on renal dialysis: Secondary | ICD-10-CM | POA: Diagnosis not present

## 2022-05-16 DIAGNOSIS — D689 Coagulation defect, unspecified: Secondary | ICD-10-CM | POA: Diagnosis not present

## 2022-05-16 NOTE — Telephone Encounter (Signed)
Although it has been approved only for COPD we have used breztri for asthma as well . I called and discussed with patient. She knows to stop both symbicort and spriva while using breztri.

## 2022-05-17 DIAGNOSIS — N6311 Unspecified lump in the right breast, upper outer quadrant: Secondary | ICD-10-CM | POA: Diagnosis not present

## 2022-05-17 DIAGNOSIS — R928 Other abnormal and inconclusive findings on diagnostic imaging of breast: Secondary | ICD-10-CM | POA: Diagnosis not present

## 2022-05-18 DIAGNOSIS — D689 Coagulation defect, unspecified: Secondary | ICD-10-CM | POA: Diagnosis not present

## 2022-05-18 DIAGNOSIS — Z992 Dependence on renal dialysis: Secondary | ICD-10-CM | POA: Diagnosis not present

## 2022-05-18 DIAGNOSIS — N186 End stage renal disease: Secondary | ICD-10-CM | POA: Diagnosis not present

## 2022-05-18 DIAGNOSIS — N2581 Secondary hyperparathyroidism of renal origin: Secondary | ICD-10-CM | POA: Diagnosis not present

## 2022-05-18 DIAGNOSIS — D631 Anemia in chronic kidney disease: Secondary | ICD-10-CM | POA: Diagnosis not present

## 2022-05-20 DIAGNOSIS — L738 Other specified follicular disorders: Secondary | ICD-10-CM | POA: Diagnosis not present

## 2022-05-20 DIAGNOSIS — D1801 Hemangioma of skin and subcutaneous tissue: Secondary | ICD-10-CM | POA: Diagnosis not present

## 2022-05-20 DIAGNOSIS — L723 Sebaceous cyst: Secondary | ICD-10-CM | POA: Diagnosis not present

## 2022-05-20 DIAGNOSIS — L821 Other seborrheic keratosis: Secondary | ICD-10-CM | POA: Diagnosis not present

## 2022-05-21 DIAGNOSIS — N2581 Secondary hyperparathyroidism of renal origin: Secondary | ICD-10-CM | POA: Diagnosis not present

## 2022-05-21 DIAGNOSIS — N186 End stage renal disease: Secondary | ICD-10-CM | POA: Diagnosis not present

## 2022-05-21 DIAGNOSIS — D689 Coagulation defect, unspecified: Secondary | ICD-10-CM | POA: Diagnosis not present

## 2022-05-21 DIAGNOSIS — Z992 Dependence on renal dialysis: Secondary | ICD-10-CM | POA: Diagnosis not present

## 2022-05-21 DIAGNOSIS — D631 Anemia in chronic kidney disease: Secondary | ICD-10-CM | POA: Diagnosis not present

## 2022-05-23 ENCOUNTER — Encounter: Payer: Self-pay | Admitting: Cardiology

## 2022-05-23 DIAGNOSIS — R011 Cardiac murmur, unspecified: Secondary | ICD-10-CM | POA: Insufficient documentation

## 2022-05-23 DIAGNOSIS — T8612 Kidney transplant failure: Secondary | ICD-10-CM | POA: Diagnosis not present

## 2022-05-23 DIAGNOSIS — Z992 Dependence on renal dialysis: Secondary | ICD-10-CM | POA: Diagnosis not present

## 2022-05-23 DIAGNOSIS — R0602 Shortness of breath: Secondary | ICD-10-CM | POA: Insufficient documentation

## 2022-05-23 DIAGNOSIS — N186 End stage renal disease: Secondary | ICD-10-CM | POA: Diagnosis not present

## 2022-05-23 NOTE — Progress Notes (Signed)
Cardiology Office Note   Date:  05/24/2022   ID:  Kimberly [name], DOB 26-Sep-1955, MRN [id number]  PCP:  [name], MD  Cardiologist:   Minus Breeding, MD Referring:  [name], MD  Chief Complaint  Patient presents with   Shortness of Breath      History of Present Illness: Kimberly [name] is a 66 y.o. female who presents for evaluation of SOB and a murmur.   She has "carcinoid tumorlets" in her lung thought not to be clinically significant.  She has asthma.  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 )  Past cardiac history includes an echocardiogram in 2018 without evidence of left ventricular dysfunction.  There was some mild aortic valve calcification.  She did see Dr. Rayann Heman years ago.  She had pericarditis and had brief atrial fibrillation at that time.  She had asthma and chronic cough.  She did have distant chest discomfort and perfusion study that was apparently negative but I do not see these results.  She is dialysis dependent.  She reports that she does get shortness of breath.  This happens with mild activity such as walking a flight of stairs.  She is not describing PND or orthopnea.  She can make it to the top of the stairs.  She is not describing chest pressure, neck or arm discomfort.  She does not have weight gain or edema.  She says she has had a murmur "all of her life."  She does have some palpitations.  She has a hard time describing this.  She feels her heart sometimes beating fast particularly she has a cough.   Past Medical History:  Diagnosis Date   Anemia    Arthritis    Asthma    Complication of anesthesia    Slow to awaken and AMS- took 1 month to memory and recall to return   COVID    Diverticulosis    First degree AV block    GERD (gastroesophageal reflux disease)    History of blood transfusion    History of chronic cough    History of hiatal hernia    History of hoarseness     Hypertension    history; resolved on dialysis   Hypothyroidism    IgA nephropathy    ESRD - Hemo TTH Sat   Premature atrial beat    Premature ventricular beat    Renal transplant failure and rejection 2012   Shingles 03/2011   Sleep apnea    uses a mouth guard   Stroke [facility]) October 2010, March 2013   cerebral aneurysm, 07/2017    Past Surgical History:  Procedure Laterality Date   AV FISTULA PLACEMENT Left 05/20/2018   Procedure: INSERTION OF ARTERIOVENOUS (AV) GORE-TEX GRAFT LEFT  ARM;  Surgeon: [name], MD;  Location: [place];  Service: Vascular;  Laterality: Left;   AV FISTULA PLACEMENT Right 10/26/2021   Procedure: INSERTION OF RIGHT ARM ARTERIOVENOUS (AV) GORE-TEX GRAFT;  Surgeon: [name], MD;  Location: [place];  Service: Vascular;  Laterality: Right;   CAPD INSERTION  08/25/2008   open, Dr [name]   CAPD INSERTION N/A 12/14/2013   Procedure: LAPAROSCOPIC INSERTION CONTINUOUS AMBULATORY PERITONEAL DIALYSIS  (CAPD) CATHETER;  Surgeon: [name], MD;  Location: [place];  Service: General;  Laterality: N/A;   CAPD REMOVAL  03/22/2010   Dr [name]   COLONOSCOPY     ESOPHAGOGASTRODUODENOSCOPY     EYE  SURGERY Bilateral    radial keratotomy   Hemodialysis catheter Right 02/13/2018   INSERTION OF MESH N/A 12/14/2013   Procedure: INSERTION OF MESH;  Surgeon: [name], MD;  Location: [place];  Service: General;  Laterality: N/A;   KIDNEY TRANSPLANT Right 01/2010   [facility]   LAPAROSCOPIC LYSIS OF ADHESIONS N/A 12/14/2013   Procedure: LAPAROSCOPIC LYSIS OF ADHESIONS;  Surgeon: [name], MD;  Location: [place];  Service: General;  Laterality: N/A;   PATCH ANGIOPLASTY  06/29/2021   Procedure: PATCH ANGIOPLASTY;  Surgeon: Cherre Robins, MD;  Location: [place];  Service: Vascular;;   THROMBECTOMY AND REVISION OF ARTERIOVENTOUS (AV) GORETEX  GRAFT Left 06/29/2021   Procedure: REVISION OF LEFT UPPER EXTREMITY ARTERIOVENOUS GORETEX GRAFT WITH  THROMBECTOMY;  Surgeon: Cherre Robins, MD;  Location: [place];  Service: Vascular;  Laterality: Left;  PERIPHERAL NERVE BLOCK   TOTAL HIP ARTHROPLASTY Left 02/06/2019   Procedure: TOTAL HIP ARTHROPLASTY ANTERIOR APPROACH;  Surgeon: Rod Can, MD;  Location: [place];  Service: Orthopedics;  Laterality: Left;   UMBILICAL HERNIA REPAIR  10/25/2009   open with mesh,  Dr [name]   URETER REVISION  04/2011   [facility]   VENTRAL HERNIA REPAIR N/A 12/14/2013   Procedure: LAPAROSCOPIC VENTRAL HERNIA;  Surgeon: [name], MD;  Location: [place];  Service: General;  Laterality: N/A;   [place] (VATS)/WEDGE RESECTION Left 09/01/2014   Procedure: VIDEO ASSISTED THORACOSCOPY (VATS)/WEDGE RESECTION;  Surgeon: [name], MD;  Location: [place];  Service: Thoracic;  Laterality: Left;   VIDEO BRONCHOSCOPY Bilateral 07/05/2014   Procedure: VIDEO BRONCHOSCOPY WITH FLUORO;  Surgeon: [name], MD;  Location: [facility];  Service: Cardiopulmonary;  Laterality: Bilateral;     Current Outpatient Medications  Medication Sig Dispense Refill   albuterol (VENTOLIN HFA) 108 (90 Base) MCG/ACT inhaler Inhale 2 puffs into the lungs every 6 (six) hours as needed for shortness of breath or wheezing (Asthma).     amLODipine (NORVASC) 10 MG tablet Take 10 mg by mouth daily.     Budeson-Glycopyrrol-Formoterol (BREZTRI AEROSPHERE) 160-9-4.8 MCG/ACT AERO Inhale 2 puffs into the lungs in the morning and at bedtime. 5.9 g 0   calcium acetate (PHOSLO) 667 MG capsule Take 1,334 mg by mouth 3 (three) times daily with meals.     cinacalcet (SENSIPAR) 30 MG tablet Take 30 mg by mouth daily after supper.     EPOETIN ALFA IJ Inject into the skin See admin instructions. Inject  subcutaneously at dialysis as needed for low hemoglobin     Garlic 9485 MG CAPS Take 1,000 mg by mouth in the morning, at noon, and at bedtime.     hydroxychloroquine (PLAQUENIL) 200 MG tablet Take 200 mg by mouth daily.      levothyroxine (SYNTHROID, LEVOTHROID) 50 MCG tablet Take 50-75 mcg by mouth See admin instructions. Take  1 1/2 tablets (75 mcg) by mouth daily on Tuesday and Thursday nights, take 1 tablet (50 mcg) by mouth daily on all other nights of the week     losartan (COZAAR) 100 MG tablet Take 100 mg by mouth daily.     Multiple Vitamins-Minerals (ZINC PO) Take 22 mg by mouth daily.     OVER THE COUNTER MEDICATION Take 4 capsules by mouth 2 (two) times a day. Juice plus - 1 fruit, 1 vegetable, 1 berries, 1 omega - twice daily     predniSONE (DELTASONE) 5 MG tablet Take 5 mg by mouth daily  with breakfast.     Probiotic Product (PROBIOTIC DAILY PO) Take 1 tablet by mouth daily.     PSYLLIUM PO Take 3 capsules by mouth daily at 2 PM.      No current facility-administered medications for this visit.    Allergies:   Mircera [methoxy polyethylene glycol-epoetin beta], Mycophenolate mofetil, Dapsone, Pregabalin, Sulfonamide derivatives, and Tramadol    Social History:  The patient  reports that she has never smoked. She has never been exposed to tobacco smoke. She has never used smokeless tobacco. She reports current alcohol use. She reports that she does not use drugs.   Family History:  The patient's family history includes Coronary artery disease in her mother; Diabetes in her maternal uncle; Emphysema in her mother; Kidney disease in her maternal uncle; Liver disease in her father; Ovarian cancer in her mother; Stroke in her maternal uncle.    ROS:  Please see the history of present illness.   Otherwise, review of systems are positive for none.   All other systems are reviewed and negative.    PHYSICAL EXAM: VS:  BP (!) 140/78   Pulse 68   Ht '5\' 5"'$  (1.651 m)   Wt 118 lb 9.6 oz (53.8 kg)   SpO2 98%   BMI 19.74 kg/m  , BMI Body mass index is 19.74 kg/m. GENERAL:  Well appearing HEENT:  Pupils equal round and reactive, fundi not visualized, oral mucosa unremarkable NECK:  No jugular venous  distention, waveform within normal limits, carotid upstroke brisk and symmetric, no bruits, no thyromegaly LYMPHATICS:  No cervical, inguinal adenopathy LUNGS:  Clear to auscultation bilaterally BACK:  No CVA tenderness CHEST:  Unremarkable HEART:  PMI not displaced or sustained,S1 and S2 within normal limits, no S3, no S4, no clicks, no rubs, 3 out of 6 apical mid peaking systolic murmur radiating at the aortic outflow tract, appreciated also slightly in the axilla, there is a to and fro murmur in the right upper chest associated with her dialysis graft ABD:  Flat, positive bowel sounds normal in frequency in pitch, no bruits, no rebound, no guarding, no midline pulsatile mass, no hepatomegaly, no splenomegaly EXT:  2 plus pulses throughout, no edema, no cyanosis no clubbing SKIN:  No rashes no nodules NEURO:  Cranial nerves II through XII grossly intact, motor grossly intact throughout PSYCH:  Cognitively intact, oriented to person place and time    EKG:  EKG is ordered today. The ekg ordered today demonstrates sinus rhythm, rate 68, axis within normal limits, intervals within normal limits, no acute ST-T wave changes.   Recent Labs: 09/22/2021: ALT 7; Platelets 293 10/26/2021: BUN 41; Creatinine, Ser 6.20; Hemoglobin 12.2; Potassium 4.7; Sodium 136    Lipid Panel    Component Value Date/Time   CHOL 148 11/30/2011 0016   TRIG 89 11/30/2011 0016   HDL 54 11/30/2011 0016   CHOLHDL 2.7 11/30/2011 0016   VLDL 18 11/30/2011 0016   LDLCALC 76 11/30/2011 0016      Wt Readings from Last 3 Encounters:  05/24/22 118 lb 9.6 oz (53.8 kg)  05/13/22 114 lb 9.6 oz (52 kg)  02/08/22 110 lb 12.8 oz (50.3 kg)      Other studies Reviewed: Additional studies/ records that were reviewed today include: Pulmonary records, labs. Review of the above records demonstrates:  Please see elsewhere in the note.     ASSESSMENT AND PLAN:  SOB: She certainly has cardiovascular risk factors.  There has  been coronary calcium and  aortic atherosclerosis noted on CT.  She needs screening for obstructive coronary disease but would not be able to walk on a treadmill.  She will have a [employer].  Of note I did see her labs from March.  She is chronically anemic.  Thyroid was normal.  Murmur: She had aortic valve calcification and I suspect moderate aortic stenosis.  I will check an echocardiogram.  Atrial fib: She has had a brief distant history of this.  She does have some palpitations and I might in the future screen with a monitor.  HTN: Blood pressure is typically well controlled and actually drops during dialysis.  Current medicines are reviewed at length with the patient today.  The patient does not have concerns regarding medicines.  The following changes have been made:  no change  Labs/ tests ordered today include:   Orders Placed This Encounter  Procedures   MYOCARDIAL PERFUSION IMAGING   EKG 12-Lead   ECHOCARDIOGRAM COMPLETE     Disposition:   FU with me after the above testing    Signed, Minus Breeding, MD  05/24/2022 10:21 AM    [place]

## 2022-05-24 ENCOUNTER — Encounter: Payer: Self-pay | Admitting: Cardiology

## 2022-05-24 ENCOUNTER — Ambulatory Visit: Payer: Medicare Other | Attending: Cardiology | Admitting: Cardiology

## 2022-05-24 VITALS — BP 140/78 | HR 68 | Ht 65.0 in | Wt 118.6 lb

## 2022-05-24 DIAGNOSIS — R0602 Shortness of breath: Secondary | ICD-10-CM | POA: Diagnosis not present

## 2022-05-24 DIAGNOSIS — R011 Cardiac murmur, unspecified: Secondary | ICD-10-CM

## 2022-05-24 NOTE — Patient Instructions (Signed)
Medication Instructions:  No changes *If you need a refill on your cardiac medications before your next appointment, please call your pharmacy*   Lab Work: None ordered If you have labs (blood work) drawn today and your tests are completely normal, you will receive your results only by: [facility] (if you have MyChart) OR A paper copy in the mail If you have any lab test that is abnormal or we need to change your treatment, we will call you to review the results.   Testing/Procedures: Your physician has requested that you have an echocardiogram. Echocardiography is a painless test that uses sound waves to create images of your heart. It provides your doctor with information about the size and shape of your heart and how well your heart's chambers and valves are working. You may receive an ultrasound enhancing agent through an IV if needed to better visualize your heart during the echo.This procedure takes approximately one hour. There are no restrictions for this procedure. This will take place at the [address]   Your physician has requested that you have a lexiscan myoview. For further information please visit [email or web address]. Please follow instruction sheet, as given. This will take place at [address]  How to prepare for your Myocardial Perfusion Test: Do not eat or drink 3 hours prior to your test, except you may have water. Do not consume products containing caffeine (regular or decaffeinated) 12 hours prior to your test. (ex: coffee, chocolate, sodas, tea). Do bring a list of your current medications with you.  If not listed below, you may take your medications as normal. Do wear comfortable clothes (no dresses or overalls) and walking shoes, tennis shoes preferred (No heels or open toe shoes are allowed). Do NOT wear cologne, perfume, aftershave, or lotions (deodorant is allowed). The test will take approximately 3 to 4 hours to  complete If these instructions are not followed, your test will have to be rescheduled.   Follow-Up: At [facility], you and your health needs are our priority.  As part of our continuing mission to provide you with exceptional heart care, we have created designated Provider Care Teams.  These Care Teams include your primary Cardiologist (physician) and Advanced Practice Providers (APPs -  Physician Assistants and Nurse Practitioners) who all work together to provide you with the care you need, when you need it.  We recommend signing up for the patient portal called "MyChart".  Sign up information is provided on this After Visit Summary.  MyChart is used to connect with patients for Virtual Visits (Telemedicine).  Patients are able to view lab/test results, encounter notes, upcoming appointments, etc.  Non-urgent messages can be sent to your provider as well.   To learn more about what you can do with MyChart, go to [email or web address].    Your next appointment:   1-2 month(s)  The format for your next appointment:   In Person  Provider:   Minus Breeding, MD     Important Information About Sugar

## 2022-05-25 DIAGNOSIS — D689 Coagulation defect, unspecified: Secondary | ICD-10-CM | POA: Diagnosis not present

## 2022-05-25 DIAGNOSIS — N2581 Secondary hyperparathyroidism of renal origin: Secondary | ICD-10-CM | POA: Diagnosis not present

## 2022-05-25 DIAGNOSIS — D631 Anemia in chronic kidney disease: Secondary | ICD-10-CM | POA: Diagnosis not present

## 2022-05-25 DIAGNOSIS — Z992 Dependence on renal dialysis: Secondary | ICD-10-CM | POA: Diagnosis not present

## 2022-05-25 DIAGNOSIS — N186 End stage renal disease: Secondary | ICD-10-CM | POA: Diagnosis not present

## 2022-05-25 DIAGNOSIS — R509 Fever, unspecified: Secondary | ICD-10-CM | POA: Diagnosis not present

## 2022-05-28 DIAGNOSIS — N186 End stage renal disease: Secondary | ICD-10-CM | POA: Diagnosis not present

## 2022-05-28 DIAGNOSIS — D631 Anemia in chronic kidney disease: Secondary | ICD-10-CM | POA: Diagnosis not present

## 2022-05-28 DIAGNOSIS — D689 Coagulation defect, unspecified: Secondary | ICD-10-CM | POA: Diagnosis not present

## 2022-05-28 DIAGNOSIS — R509 Fever, unspecified: Secondary | ICD-10-CM | POA: Diagnosis not present

## 2022-05-28 DIAGNOSIS — N2581 Secondary hyperparathyroidism of renal origin: Secondary | ICD-10-CM | POA: Diagnosis not present

## 2022-05-28 DIAGNOSIS — Z992 Dependence on renal dialysis: Secondary | ICD-10-CM | POA: Diagnosis not present

## 2022-05-29 DIAGNOSIS — N6489 Other specified disorders of breast: Secondary | ICD-10-CM | POA: Diagnosis not present

## 2022-05-30 DIAGNOSIS — Z992 Dependence on renal dialysis: Secondary | ICD-10-CM | POA: Diagnosis not present

## 2022-05-30 DIAGNOSIS — D689 Coagulation defect, unspecified: Secondary | ICD-10-CM | POA: Diagnosis not present

## 2022-05-30 DIAGNOSIS — N2581 Secondary hyperparathyroidism of renal origin: Secondary | ICD-10-CM | POA: Diagnosis not present

## 2022-05-30 DIAGNOSIS — R509 Fever, unspecified: Secondary | ICD-10-CM | POA: Diagnosis not present

## 2022-05-30 DIAGNOSIS — D631 Anemia in chronic kidney disease: Secondary | ICD-10-CM | POA: Diagnosis not present

## 2022-05-30 DIAGNOSIS — N186 End stage renal disease: Secondary | ICD-10-CM | POA: Diagnosis not present

## 2022-06-01 DIAGNOSIS — D689 Coagulation defect, unspecified: Secondary | ICD-10-CM | POA: Diagnosis not present

## 2022-06-01 DIAGNOSIS — N2581 Secondary hyperparathyroidism of renal origin: Secondary | ICD-10-CM | POA: Diagnosis not present

## 2022-06-01 DIAGNOSIS — R509 Fever, unspecified: Secondary | ICD-10-CM | POA: Diagnosis not present

## 2022-06-01 DIAGNOSIS — N186 End stage renal disease: Secondary | ICD-10-CM | POA: Diagnosis not present

## 2022-06-01 DIAGNOSIS — D631 Anemia in chronic kidney disease: Secondary | ICD-10-CM | POA: Diagnosis not present

## 2022-06-01 DIAGNOSIS — Z992 Dependence on renal dialysis: Secondary | ICD-10-CM | POA: Diagnosis not present

## 2022-06-04 ENCOUNTER — Telehealth (HOSPITAL_COMMUNITY): Payer: Self-pay | Admitting: *Deleted

## 2022-06-04 DIAGNOSIS — N2581 Secondary hyperparathyroidism of renal origin: Secondary | ICD-10-CM | POA: Diagnosis not present

## 2022-06-04 DIAGNOSIS — Z992 Dependence on renal dialysis: Secondary | ICD-10-CM | POA: Diagnosis not present

## 2022-06-04 DIAGNOSIS — R509 Fever, unspecified: Secondary | ICD-10-CM | POA: Diagnosis not present

## 2022-06-04 DIAGNOSIS — N186 End stage renal disease: Secondary | ICD-10-CM | POA: Diagnosis not present

## 2022-06-04 DIAGNOSIS — D689 Coagulation defect, unspecified: Secondary | ICD-10-CM | POA: Diagnosis not present

## 2022-06-04 DIAGNOSIS — D631 Anemia in chronic kidney disease: Secondary | ICD-10-CM | POA: Diagnosis not present

## 2022-06-04 NOTE — Telephone Encounter (Signed)
Patient given detailed instructions per Myocardial Perfusion Study Information Sheet for the test on 06/12/22 at 1045. Patient notified to arrive 15 minutes early and that it is imperative to arrive on time for appointment to keep from having the test rescheduled.  If you need to cancel or reschedule your appointment, please call the office within 24 hours of your appointment. . Patient verbalized understanding.Kimberly [name], [place]

## 2022-06-06 DIAGNOSIS — D631 Anemia in chronic kidney disease: Secondary | ICD-10-CM | POA: Diagnosis not present

## 2022-06-06 DIAGNOSIS — N2581 Secondary hyperparathyroidism of renal origin: Secondary | ICD-10-CM | POA: Diagnosis not present

## 2022-06-06 DIAGNOSIS — Z992 Dependence on renal dialysis: Secondary | ICD-10-CM | POA: Diagnosis not present

## 2022-06-06 DIAGNOSIS — R509 Fever, unspecified: Secondary | ICD-10-CM | POA: Diagnosis not present

## 2022-06-06 DIAGNOSIS — N186 End stage renal disease: Secondary | ICD-10-CM | POA: Diagnosis not present

## 2022-06-06 DIAGNOSIS — D689 Coagulation defect, unspecified: Secondary | ICD-10-CM | POA: Diagnosis not present

## 2022-06-08 DIAGNOSIS — N186 End stage renal disease: Secondary | ICD-10-CM | POA: Diagnosis not present

## 2022-06-08 DIAGNOSIS — D689 Coagulation defect, unspecified: Secondary | ICD-10-CM | POA: Diagnosis not present

## 2022-06-08 DIAGNOSIS — D631 Anemia in chronic kidney disease: Secondary | ICD-10-CM | POA: Diagnosis not present

## 2022-06-08 DIAGNOSIS — Z992 Dependence on renal dialysis: Secondary | ICD-10-CM | POA: Diagnosis not present

## 2022-06-08 DIAGNOSIS — R509 Fever, unspecified: Secondary | ICD-10-CM | POA: Diagnosis not present

## 2022-06-08 DIAGNOSIS — N2581 Secondary hyperparathyroidism of renal origin: Secondary | ICD-10-CM | POA: Diagnosis not present

## 2022-06-11 DIAGNOSIS — N2581 Secondary hyperparathyroidism of renal origin: Secondary | ICD-10-CM | POA: Diagnosis not present

## 2022-06-11 DIAGNOSIS — Z992 Dependence on renal dialysis: Secondary | ICD-10-CM | POA: Diagnosis not present

## 2022-06-11 DIAGNOSIS — R509 Fever, unspecified: Secondary | ICD-10-CM | POA: Diagnosis not present

## 2022-06-11 DIAGNOSIS — N186 End stage renal disease: Secondary | ICD-10-CM | POA: Diagnosis not present

## 2022-06-11 DIAGNOSIS — D631 Anemia in chronic kidney disease: Secondary | ICD-10-CM | POA: Diagnosis not present

## 2022-06-11 DIAGNOSIS — D689 Coagulation defect, unspecified: Secondary | ICD-10-CM | POA: Diagnosis not present

## 2022-06-12 ENCOUNTER — Ambulatory Visit (HOSPITAL_BASED_OUTPATIENT_CLINIC_OR_DEPARTMENT_OTHER): Payer: Medicare Other

## 2022-06-12 ENCOUNTER — Ambulatory Visit (HOSPITAL_COMMUNITY): Payer: Medicare Other | Attending: Cardiology

## 2022-06-12 DIAGNOSIS — R0602 Shortness of breath: Secondary | ICD-10-CM | POA: Insufficient documentation

## 2022-06-12 DIAGNOSIS — R011 Cardiac murmur, unspecified: Secondary | ICD-10-CM

## 2022-06-12 LAB — ECHOCARDIOGRAM COMPLETE
AR max vel: 2.24 cm2
AV Area VTI: 2.27 cm2
AV Area mean vel: 2.18 cm2
AV Mean grad: 8 mmHg
AV Peak grad: 14.7 mmHg
Ao pk vel: 1.92 m/s
Area-P 1/2: 2.84 cm2
S' Lateral: 2.8 cm

## 2022-06-12 MED ORDER — TECHNETIUM TC 99M TETROFOSMIN IV KIT
32.6000 | PACK | Freq: Once | INTRAVENOUS | Status: AC | PRN
  Administered 2022-06-12: 32.6 via INTRAVENOUS

## 2022-06-12 MED ORDER — REGADENOSON 0.4 MG/5ML IV SOLN
0.4000 mg | Freq: Once | INTRAVENOUS | Status: AC
  Administered 2022-06-12: 0.4 mg via INTRAVENOUS

## 2022-06-12 MED ORDER — TECHNETIUM TC 99M TETROFOSMIN IV KIT
10.3000 | PACK | Freq: Once | INTRAVENOUS | Status: AC | PRN
  Administered 2022-06-12: 10.3 via INTRAVENOUS

## 2022-06-13 DIAGNOSIS — Z992 Dependence on renal dialysis: Secondary | ICD-10-CM | POA: Diagnosis not present

## 2022-06-13 DIAGNOSIS — N2581 Secondary hyperparathyroidism of renal origin: Secondary | ICD-10-CM | POA: Diagnosis not present

## 2022-06-13 DIAGNOSIS — N186 End stage renal disease: Secondary | ICD-10-CM | POA: Diagnosis not present

## 2022-06-13 DIAGNOSIS — R509 Fever, unspecified: Secondary | ICD-10-CM | POA: Diagnosis not present

## 2022-06-13 DIAGNOSIS — D631 Anemia in chronic kidney disease: Secondary | ICD-10-CM | POA: Diagnosis not present

## 2022-06-13 DIAGNOSIS — D689 Coagulation defect, unspecified: Secondary | ICD-10-CM | POA: Diagnosis not present

## 2022-06-13 LAB — MYOCARDIAL PERFUSION IMAGING
LV dias vol: 98 mL (ref 46–106)
LV sys vol: 33 mL
Nuc Stress EF: 67 %
Peak HR: 100 {beats}/min
Rest HR: 64 {beats}/min
Rest Nuclear Isotope Dose: 10.3 mCi
SDS: 2
SRS: 0
SSS: 2
Stress Nuclear Isotope Dose: 32.6 mCi
TID: 1.02

## 2022-06-15 DIAGNOSIS — N186 End stage renal disease: Secondary | ICD-10-CM | POA: Diagnosis not present

## 2022-06-15 DIAGNOSIS — N2581 Secondary hyperparathyroidism of renal origin: Secondary | ICD-10-CM | POA: Diagnosis not present

## 2022-06-15 DIAGNOSIS — Z992 Dependence on renal dialysis: Secondary | ICD-10-CM | POA: Diagnosis not present

## 2022-06-15 DIAGNOSIS — D689 Coagulation defect, unspecified: Secondary | ICD-10-CM | POA: Diagnosis not present

## 2022-06-15 DIAGNOSIS — D631 Anemia in chronic kidney disease: Secondary | ICD-10-CM | POA: Diagnosis not present

## 2022-06-15 DIAGNOSIS — R509 Fever, unspecified: Secondary | ICD-10-CM | POA: Diagnosis not present

## 2022-06-17 ENCOUNTER — Encounter: Payer: Self-pay | Admitting: *Deleted

## 2022-06-17 ENCOUNTER — Encounter: Payer: Self-pay | Admitting: Cardiology

## 2022-06-18 DIAGNOSIS — R509 Fever, unspecified: Secondary | ICD-10-CM | POA: Diagnosis not present

## 2022-06-18 DIAGNOSIS — Z992 Dependence on renal dialysis: Secondary | ICD-10-CM | POA: Diagnosis not present

## 2022-06-18 DIAGNOSIS — D689 Coagulation defect, unspecified: Secondary | ICD-10-CM | POA: Diagnosis not present

## 2022-06-18 DIAGNOSIS — N186 End stage renal disease: Secondary | ICD-10-CM | POA: Diagnosis not present

## 2022-06-18 DIAGNOSIS — N2581 Secondary hyperparathyroidism of renal origin: Secondary | ICD-10-CM | POA: Diagnosis not present

## 2022-06-18 DIAGNOSIS — D631 Anemia in chronic kidney disease: Secondary | ICD-10-CM | POA: Diagnosis not present

## 2022-06-20 DIAGNOSIS — R509 Fever, unspecified: Secondary | ICD-10-CM | POA: Diagnosis not present

## 2022-06-20 DIAGNOSIS — N2581 Secondary hyperparathyroidism of renal origin: Secondary | ICD-10-CM | POA: Diagnosis not present

## 2022-06-20 DIAGNOSIS — D689 Coagulation defect, unspecified: Secondary | ICD-10-CM | POA: Diagnosis not present

## 2022-06-20 DIAGNOSIS — D631 Anemia in chronic kidney disease: Secondary | ICD-10-CM | POA: Diagnosis not present

## 2022-06-20 DIAGNOSIS — N186 End stage renal disease: Secondary | ICD-10-CM | POA: Diagnosis not present

## 2022-06-20 DIAGNOSIS — Z992 Dependence on renal dialysis: Secondary | ICD-10-CM | POA: Diagnosis not present

## 2022-06-22 DIAGNOSIS — R509 Fever, unspecified: Secondary | ICD-10-CM | POA: Diagnosis not present

## 2022-06-22 DIAGNOSIS — Z992 Dependence on renal dialysis: Secondary | ICD-10-CM | POA: Diagnosis not present

## 2022-06-22 DIAGNOSIS — D689 Coagulation defect, unspecified: Secondary | ICD-10-CM | POA: Diagnosis not present

## 2022-06-22 DIAGNOSIS — D631 Anemia in chronic kidney disease: Secondary | ICD-10-CM | POA: Diagnosis not present

## 2022-06-22 DIAGNOSIS — N186 End stage renal disease: Secondary | ICD-10-CM | POA: Diagnosis not present

## 2022-06-22 DIAGNOSIS — T8612 Kidney transplant failure: Secondary | ICD-10-CM | POA: Diagnosis not present

## 2022-06-22 DIAGNOSIS — N2581 Secondary hyperparathyroidism of renal origin: Secondary | ICD-10-CM | POA: Diagnosis not present

## 2022-06-25 DIAGNOSIS — N2581 Secondary hyperparathyroidism of renal origin: Secondary | ICD-10-CM | POA: Diagnosis not present

## 2022-06-25 DIAGNOSIS — E039 Hypothyroidism, unspecified: Secondary | ICD-10-CM | POA: Diagnosis not present

## 2022-06-25 DIAGNOSIS — E1129 Type 2 diabetes mellitus with other diabetic kidney complication: Secondary | ICD-10-CM | POA: Diagnosis not present

## 2022-06-25 DIAGNOSIS — Z992 Dependence on renal dialysis: Secondary | ICD-10-CM | POA: Diagnosis not present

## 2022-06-25 DIAGNOSIS — Z23 Encounter for immunization: Secondary | ICD-10-CM | POA: Diagnosis not present

## 2022-06-25 DIAGNOSIS — N186 End stage renal disease: Secondary | ICD-10-CM | POA: Diagnosis not present

## 2022-06-25 DIAGNOSIS — D689 Coagulation defect, unspecified: Secondary | ICD-10-CM | POA: Diagnosis not present

## 2022-06-25 DIAGNOSIS — D631 Anemia in chronic kidney disease: Secondary | ICD-10-CM | POA: Diagnosis not present

## 2022-06-27 DIAGNOSIS — D689 Coagulation defect, unspecified: Secondary | ICD-10-CM | POA: Diagnosis not present

## 2022-06-27 DIAGNOSIS — Z23 Encounter for immunization: Secondary | ICD-10-CM | POA: Diagnosis not present

## 2022-06-27 DIAGNOSIS — N2581 Secondary hyperparathyroidism of renal origin: Secondary | ICD-10-CM | POA: Diagnosis not present

## 2022-06-27 DIAGNOSIS — D631 Anemia in chronic kidney disease: Secondary | ICD-10-CM | POA: Diagnosis not present

## 2022-06-27 DIAGNOSIS — Z992 Dependence on renal dialysis: Secondary | ICD-10-CM | POA: Diagnosis not present

## 2022-06-27 DIAGNOSIS — N186 End stage renal disease: Secondary | ICD-10-CM | POA: Diagnosis not present

## 2022-06-27 DIAGNOSIS — E1129 Type 2 diabetes mellitus with other diabetic kidney complication: Secondary | ICD-10-CM | POA: Diagnosis not present

## 2022-06-27 DIAGNOSIS — E039 Hypothyroidism, unspecified: Secondary | ICD-10-CM | POA: Diagnosis not present

## 2022-06-29 DIAGNOSIS — E039 Hypothyroidism, unspecified: Secondary | ICD-10-CM | POA: Diagnosis not present

## 2022-06-29 DIAGNOSIS — Z992 Dependence on renal dialysis: Secondary | ICD-10-CM | POA: Diagnosis not present

## 2022-06-29 DIAGNOSIS — N2581 Secondary hyperparathyroidism of renal origin: Secondary | ICD-10-CM | POA: Diagnosis not present

## 2022-06-29 DIAGNOSIS — D689 Coagulation defect, unspecified: Secondary | ICD-10-CM | POA: Diagnosis not present

## 2022-06-29 DIAGNOSIS — Z23 Encounter for immunization: Secondary | ICD-10-CM | POA: Diagnosis not present

## 2022-06-29 DIAGNOSIS — E1129 Type 2 diabetes mellitus with other diabetic kidney complication: Secondary | ICD-10-CM | POA: Diagnosis not present

## 2022-06-29 DIAGNOSIS — N186 End stage renal disease: Secondary | ICD-10-CM | POA: Diagnosis not present

## 2022-06-29 DIAGNOSIS — D631 Anemia in chronic kidney disease: Secondary | ICD-10-CM | POA: Diagnosis not present

## 2022-06-30 NOTE — Progress Notes (Unsigned)
Cardiology Office Note   Date:  07/01/2022   ID:  Kimberly [name], DOB 1956/09/23, MRN [id number]  PCP:  [name], MD  Cardiologist:   Minus Breeding, MD Referring:  [name], MD  Chief Complaint  Patient presents with   Shortness of Breath      History of Present Illness: Kimberly [name] is a 66 y.o. female who presents for evaluation of SOB and a murmur.   She has "carcinoid tumorlets" in her lung thought not to be clinically significant.  She has asthma.  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 )  Past cardiac history includes an echocardiogram in 2018 without evidence of left ventricular dysfunction.  There was some mild aortic valve calcification.  She did see Dr. Rayann Heman years ago.  She had pericarditis and had brief atrial fibrillation at that time.  She had asthma and chronic cough.  She did have distant chest discomfort and perfusion study that was apparently negative but I do not see these results.  She is dialysis dependent.  I saw her and she had increased SOB.   She had a low risk perfusion study without ischemia after I first saw her.   She had a normal EF on echo with mild aortic valve sclerosis.     She continues to have shortness of breath with some activities such as doing chores in her house.  She also describes palpitations which seem to happen when she gets short of breath but can also happen independently.  She is not describing any chest pressure, neck or arm discomfort.  Her blood pressure is a little elevated today but she says sometimes in dialysis her systolics dropped down to the 80s.  Her systolics in general run in the 130s as she read to me her readings.  She continues to have a cough.   Past Medical History:  Diagnosis Date   Anemia    Arthritis    Asthma    Complication of anesthesia    Slow to awaken and AMS- took 1 month to memory and recall to return   COVID    Diverticulosis     First degree AV block    GERD (gastroesophageal reflux disease)    History of blood transfusion    History of chronic cough    History of hiatal hernia    History of hoarseness    Hypertension    history; resolved on dialysis   Hypothyroidism    IgA nephropathy    ESRD - Hemo TTH Sat   Premature atrial beat    Premature ventricular beat    Renal transplant failure and rejection 2012   Shingles 03/2011   Sleep apnea    uses a mouth guard   Stroke [facility]) October 2010, March 2013   cerebral aneurysm, 07/2017    Past Surgical History:  Procedure Laterality Date   AV FISTULA PLACEMENT Left 05/20/2018   Procedure: INSERTION OF ARTERIOVENOUS (AV) GORE-TEX GRAFT LEFT  ARM;  Surgeon: [name], MD;  Location: [place];  Service: Vascular;  Laterality: Left;   AV FISTULA PLACEMENT Right 10/26/2021   Procedure: INSERTION OF RIGHT ARM ARTERIOVENOUS (AV) GORE-TEX GRAFT;  Surgeon: [name], MD;  Location: [place];  Service: Vascular;  Laterality: Right;   CAPD INSERTION  08/25/2008   open, Dr [name]   CAPD INSERTION N/A 12/14/2013   Procedure: LAPAROSCOPIC INSERTION CONTINUOUS AMBULATORY PERITONEAL DIALYSIS  (CAPD) CATHETER;  Surgeon: Remo Lipps  C. Gross, MD;  Location: [place];  Service: General;  Laterality: N/A;   CAPD REMOVAL  03/22/2010   Dr [name]   COLONOSCOPY     ESOPHAGOGASTRODUODENOSCOPY     EYE SURGERY Bilateral    radial keratotomy   Hemodialysis catheter Right 02/13/2018   INSERTION OF MESH N/A 12/14/2013   Procedure: INSERTION OF MESH;  Surgeon: [name], MD;  Location: [place];  Service: General;  Laterality: N/A;   KIDNEY TRANSPLANT Right 01/2010   [facility]   LAPAROSCOPIC LYSIS OF ADHESIONS N/A 12/14/2013   Procedure: LAPAROSCOPIC LYSIS OF ADHESIONS;  Surgeon: [name], MD;  Location: [place];  Service: General;  Laterality: N/A;   PATCH ANGIOPLASTY  06/29/2021   Procedure: PATCH ANGIOPLASTY;  Surgeon: Cherre Robins, MD;  Location: [place];   Service: Vascular;;   THROMBECTOMY AND REVISION OF ARTERIOVENTOUS (AV) GORETEX  GRAFT Left 06/29/2021   Procedure: REVISION OF LEFT UPPER EXTREMITY ARTERIOVENOUS GORETEX GRAFT WITH THROMBECTOMY;  Surgeon: Cherre Robins, MD;  Location: [place];  Service: Vascular;  Laterality: Left;  PERIPHERAL NERVE BLOCK   TOTAL HIP ARTHROPLASTY Left 02/06/2019   Procedure: TOTAL HIP ARTHROPLASTY ANTERIOR APPROACH;  Surgeon: Rod Can, MD;  Location: [place];  Service: Orthopedics;  Laterality: Left;   UMBILICAL HERNIA REPAIR  10/25/2009   open with mesh,  Dr [name]   URETER REVISION  04/2011   [facility]   VENTRAL HERNIA REPAIR N/A 12/14/2013   Procedure: LAPAROSCOPIC VENTRAL HERNIA;  Surgeon: [name], MD;  Location: [place];  Service: General;  Laterality: N/A;   [place] (VATS)/WEDGE RESECTION Left 09/01/2014   Procedure: VIDEO ASSISTED THORACOSCOPY (VATS)/WEDGE RESECTION;  Surgeon: [name], MD;  Location: [place];  Service: Thoracic;  Laterality: Left;   VIDEO BRONCHOSCOPY Bilateral 07/05/2014   Procedure: VIDEO BRONCHOSCOPY WITH FLUORO;  Surgeon: [name], MD;  Location: [facility];  Service: Cardiopulmonary;  Laterality: Bilateral;     Current Outpatient Medications  Medication Sig Dispense Refill   albuterol (VENTOLIN HFA) 108 (90 Base) MCG/ACT inhaler Inhale 2 puffs into the lungs every 6 (six) hours as needed for shortness of breath or wheezing (Asthma).     amLODipine (NORVASC) 10 MG tablet Take 10 mg by mouth daily.     Budeson-Glycopyrrol-Formoterol (BREZTRI AEROSPHERE) 160-9-4.8 MCG/ACT AERO Inhale 2 puffs into the lungs in the morning and at bedtime. 5.9 g 0   calcium acetate (PHOSLO) 667 MG capsule Take 1,334 mg by mouth 3 (three) times daily with meals.     cinacalcet (SENSIPAR) 30 MG tablet Take 30 mg by mouth daily after supper.     EPOETIN ALFA IJ Inject into the skin See admin instructions. Inject  subcutaneously at dialysis as needed for low  hemoglobin     Garlic 1751 MG CAPS Take 1,000 mg by mouth in the morning, at noon, and at bedtime.     hydroxychloroquine (PLAQUENIL) 200 MG tablet Take 200 mg by mouth daily.     levothyroxine (SYNTHROID, LEVOTHROID) 50 MCG tablet Take 50-75 mcg by mouth See admin instructions. Take  1 1/2 tablets (75 mcg) by mouth daily on Tuesday and Thursday nights, take 1 tablet (50 mcg) by mouth daily on all other nights of the week     losartan (COZAAR) 100 MG tablet Take 100 mg by mouth daily.     Multiple Vitamins-Minerals (ZINC PO) Take 22 mg by mouth daily.     OVER THE COUNTER MEDICATION Take 4 capsules  by mouth 2 (two) times a day. Juice plus - 1 fruit, 1 vegetable, 1 berries, 1 omega - twice daily     predniSONE (DELTASONE) 5 MG tablet Take 5 mg by mouth daily with breakfast.     Probiotic Product (PROBIOTIC DAILY PO) Take 1 tablet by mouth daily.     PSYLLIUM PO Take 3 capsules by mouth daily at 2 PM.      No current facility-administered medications for this visit.    Allergies:   Mircera [methoxy polyethylene glycol-epoetin beta], Mycophenolate mofetil, Dapsone, Pregabalin, Sulfonamide derivatives, and Tramadol   ROS:  Please see the history of present illness.   Otherwise, review of systems are positive for none.   All other systems are reviewed and negative.    PHYSICAL EXAM: VS:  BP (!) 152/81   Pulse 65   Ht '5\' 5"'$  (1.651 m)   Wt 112 lb 6.4 oz (51 kg)   SpO2 95%   BMI 18.70 kg/m  , BMI Body mass index is 18.7 kg/m. GENERAL:  Well appearing NECK:  No jugular venous distention, waveform within normal limits, carotid upstroke brisk and symmetric, no bruits, no thyromegaly LUNGS:  Clear to auscultation bilaterally CHEST:  Unremarkable HEART:  PMI not displaced or sustained,S1 and S2 within normal limits, no S3, no S4, no clicks, no rubs, to and fro continuous murmur ABD:  Flat, positive bowel sounds normal in frequency in pitch, no bruits, no rebound, no guarding, no midline pulsatile  mass, no hepatomegaly, no splenomegaly EXT:  2 plus pulses throughout, no edema, no cyanosis no clubbing, right upper arm dialysis graft    EKG:  EKG is not ordered today.   Recent Labs: 09/22/2021: ALT 7; Platelets 293 10/26/2021: BUN 41; Creatinine, Ser 6.20; Hemoglobin 12.2; Potassium 4.7; Sodium 136    Lipid Panel    Component Value Date/Time   CHOL 148 11/30/2011 0016   TRIG 89 11/30/2011 0016   HDL 54 11/30/2011 0016   CHOLHDL 2.7 11/30/2011 0016   VLDL 18 11/30/2011 0016   LDLCALC 76 11/30/2011 0016      Wt Readings from Last 3 Encounters:  07/01/22 112 lb 6.4 oz (51 kg)  06/12/22 118 lb (53.5 kg)  05/24/22 118 lb 9.6 oz (53.8 kg)      Other studies Reviewed: Additional studies/ records that were reviewed today include: Echo, imaging study. Review of the above records demonstrates:  Please see elsewhere in the note.     ASSESSMENT AND PLAN:  SOB:   At this point I do not see a cardiovascular etiology.  The perfusion study and echo were high risk.  She does have chronic pulmonary issues as above.  I sent a message to her pulmonologist to see if there is any further work-up.   Murmur:   This is likely related to her dialysis has had no further work-up.  Atrial fib:   I will apply a 2-week monitor given her tachypalpitations and his previous history.  HTN: Blood pressure is labile.  Because of the hypotension with dialysis I will not titrate her meds further.  Diastolic dysfunction: She does have this on echocardiography.  However, volume is managed with dialysis.  Blood pressure is as above controlled.  No change in therapy.  I think this is probably related to her hypertension.  Current medicines are reviewed at length with the patient today.  The patient does not have concerns regarding medicines.  The following changes have been made: None  Labs/ tests ordered  today include: 2-week monitor  Orders Placed This Encounter  Procedures   LONG TERM MONITOR  (3-14 DAYS)     Disposition:   FU with me in 2 months   Signed, Minus Breeding, MD  07/01/2022 9:47 AM    [place]

## 2022-07-01 ENCOUNTER — Ambulatory Visit: Payer: Medicare Other | Attending: Cardiology | Admitting: Cardiology

## 2022-07-01 ENCOUNTER — Encounter: Payer: Self-pay | Admitting: Cardiology

## 2022-07-01 ENCOUNTER — Ambulatory Visit (INDEPENDENT_AMBULATORY_CARE_PROVIDER_SITE_OTHER): Payer: Medicare Other

## 2022-07-01 VITALS — BP 152/81 | HR 65 | Ht 65.0 in | Wt 112.4 lb

## 2022-07-01 DIAGNOSIS — R002 Palpitations: Secondary | ICD-10-CM

## 2022-07-01 DIAGNOSIS — R011 Cardiac murmur, unspecified: Secondary | ICD-10-CM

## 2022-07-01 DIAGNOSIS — R0602 Shortness of breath: Secondary | ICD-10-CM

## 2022-07-01 DIAGNOSIS — I480 Paroxysmal atrial fibrillation: Secondary | ICD-10-CM | POA: Diagnosis not present

## 2022-07-01 DIAGNOSIS — I10 Essential (primary) hypertension: Secondary | ICD-10-CM

## 2022-07-01 NOTE — Patient Instructions (Signed)
Medication Instructions:  Your physician recommends that you continue on your current medications as directed. Please refer to the Current Medication list given to you today.  *If you need a refill on your cardiac medications before your next appointment, please call your pharmacy*   Testing/Procedures:  [place] Monitor Instructions  Your physician has requested you wear a ZIO patch monitor for 14 days.  This is a single patch monitor. [employer] supplies one patch monitor per enrollment. Additional stickers are not available. Please do not apply patch if you will be having a Nuclear Stress Test,  Echocardiogram, Cardiac CT, MRI, or Chest Xray during the period you would be wearing the  monitor. The patch cannot be worn during these tests. You cannot remove and re-apply the  ZIO XT patch monitor.  Your ZIO patch monitor will be mailed 3 day USPS to your address on file. It may take 3-5 days  to receive your monitor after you have been enrolled.  Once you have received your monitor, please review the enclosed instructions. Your monitor  has already been registered assigning a specific monitor serial # to you.  Billing and Patient Assistance Program Information  We have supplied [employer] with any of your insurance information on file for billing purposes. [employer] offers a sliding scale Patient Assistance Program for patients that do not have  insurance, or whose insurance does not completely cover the cost of the ZIO monitor.  You must apply for the Patient Assistance Program to qualify for this discounted rate.  To apply, please call [employer] at [phone], select option 4, select option 2, ask to apply for  Patient Assistance Program. Theodore Demark will ask your household income, and how many people  are in your household. They will quote your out-of-pocket cost based on that information.  [employer] will also be able to set up a 74-month interest-free payment plan if needed.  Applying  the monitor   Shave hair from upper left chest.  Hold abrader disc by orange tab. Rub abrader in 40 strokes over the upper left chest as  indicated in your monitor instructions.  Clean area with 4 enclosed alcohol pads. Let dry.  Apply patch as indicated in monitor instructions. Patch will be placed under collarbone on left  side of chest with arrow pointing upward.  Rub patch adhesive wings for 2 minutes. Remove white label marked "1". Remove the white  label marked "2". Rub patch adhesive wings for 2 additional minutes.  While looking in a mirror, press and release button in center of patch. A small green light will  flash 3-4 times. This will be your only indicator that the monitor has been turned on.  Do not shower for the first 24 hours. You may shower after the first 24 hours.  Press the button if you feel a symptom. You will hear a small click. Record Date, Time and  Symptom in the Patient Logbook.  When you are ready to remove the patch, follow instructions on the last 2 pages of Patient  Logbook. Stick patch monitor onto the last page of Patient Logbook.  Place Patient Logbook in the blue and white box. Use locking tab on box and tape box closed  securely. The blue and white box has prepaid postage on it. Please place it in the mailbox as  soon as possible. Your physician should have your test results approximately 7 days after the  [facility]  Call [employer]  [facility] at [phone] if you have questions regarding  your ZIO XT patch monitor. Call them immediately if you see an orange light blinking on your  monitor.  If your monitor falls off in less than 4 days, contact our Monitor department at [phone].  If your monitor becomes loose or falls off after 4 days call [employer] at [phone] for  suggestions on securing your monitor    Follow-Up: At [facility], you and your health needs are our priority.  As part  of our continuing mission to provide you with exceptional heart care, we have created designated Provider Care Teams.  These Care Teams include your primary Cardiologist (physician) and Advanced Practice Providers (APPs -  Physician Assistants and Nurse Practitioners) who all work together to provide you with the care you need, when you need it.  We recommend signing up for the patient portal called "MyChart".  Sign up information is provided on this After Visit Summary.  MyChart is used to connect with patients for Virtual Visits (Telemedicine).  Patients are able to view lab/test results, encounter notes, upcoming appointments, etc.  Non-urgent messages can be sent to your provider as well.   To learn more about what you can do with MyChart, go to [email or web address].    Your next appointment:   2 month(s)  The format for your next appointment:   In Person  Provider:   Minus Breeding, MD

## 2022-07-01 NOTE — Progress Notes (Unsigned)
Enrolled for [employer] to mail a ZIO XT long term holter monitor to the patients address on file.  

## 2022-07-02 DIAGNOSIS — D689 Coagulation defect, unspecified: Secondary | ICD-10-CM | POA: Diagnosis not present

## 2022-07-02 DIAGNOSIS — N2581 Secondary hyperparathyroidism of renal origin: Secondary | ICD-10-CM | POA: Diagnosis not present

## 2022-07-02 DIAGNOSIS — E039 Hypothyroidism, unspecified: Secondary | ICD-10-CM | POA: Diagnosis not present

## 2022-07-02 DIAGNOSIS — N186 End stage renal disease: Secondary | ICD-10-CM | POA: Diagnosis not present

## 2022-07-02 DIAGNOSIS — E1129 Type 2 diabetes mellitus with other diabetic kidney complication: Secondary | ICD-10-CM | POA: Diagnosis not present

## 2022-07-02 DIAGNOSIS — Z992 Dependence on renal dialysis: Secondary | ICD-10-CM | POA: Diagnosis not present

## 2022-07-02 DIAGNOSIS — Z23 Encounter for immunization: Secondary | ICD-10-CM | POA: Diagnosis not present

## 2022-07-02 DIAGNOSIS — D631 Anemia in chronic kidney disease: Secondary | ICD-10-CM | POA: Diagnosis not present

## 2022-07-04 DIAGNOSIS — E039 Hypothyroidism, unspecified: Secondary | ICD-10-CM | POA: Diagnosis not present

## 2022-07-04 DIAGNOSIS — Z23 Encounter for immunization: Secondary | ICD-10-CM | POA: Diagnosis not present

## 2022-07-04 DIAGNOSIS — D631 Anemia in chronic kidney disease: Secondary | ICD-10-CM | POA: Diagnosis not present

## 2022-07-04 DIAGNOSIS — Z992 Dependence on renal dialysis: Secondary | ICD-10-CM | POA: Diagnosis not present

## 2022-07-04 DIAGNOSIS — D689 Coagulation defect, unspecified: Secondary | ICD-10-CM | POA: Diagnosis not present

## 2022-07-04 DIAGNOSIS — N2581 Secondary hyperparathyroidism of renal origin: Secondary | ICD-10-CM | POA: Diagnosis not present

## 2022-07-04 DIAGNOSIS — N186 End stage renal disease: Secondary | ICD-10-CM | POA: Diagnosis not present

## 2022-07-04 DIAGNOSIS — E1129 Type 2 diabetes mellitus with other diabetic kidney complication: Secondary | ICD-10-CM | POA: Diagnosis not present

## 2022-07-06 DIAGNOSIS — R011 Cardiac murmur, unspecified: Secondary | ICD-10-CM | POA: Diagnosis not present

## 2022-07-06 DIAGNOSIS — Z992 Dependence on renal dialysis: Secondary | ICD-10-CM | POA: Diagnosis not present

## 2022-07-06 DIAGNOSIS — D689 Coagulation defect, unspecified: Secondary | ICD-10-CM | POA: Diagnosis not present

## 2022-07-06 DIAGNOSIS — Z23 Encounter for immunization: Secondary | ICD-10-CM | POA: Diagnosis not present

## 2022-07-06 DIAGNOSIS — N186 End stage renal disease: Secondary | ICD-10-CM | POA: Diagnosis not present

## 2022-07-06 DIAGNOSIS — I10 Essential (primary) hypertension: Secondary | ICD-10-CM | POA: Diagnosis not present

## 2022-07-06 DIAGNOSIS — D631 Anemia in chronic kidney disease: Secondary | ICD-10-CM | POA: Diagnosis not present

## 2022-07-06 DIAGNOSIS — E039 Hypothyroidism, unspecified: Secondary | ICD-10-CM | POA: Diagnosis not present

## 2022-07-06 DIAGNOSIS — R002 Palpitations: Secondary | ICD-10-CM

## 2022-07-06 DIAGNOSIS — R0602 Shortness of breath: Secondary | ICD-10-CM | POA: Diagnosis not present

## 2022-07-06 DIAGNOSIS — E1129 Type 2 diabetes mellitus with other diabetic kidney complication: Secondary | ICD-10-CM | POA: Diagnosis not present

## 2022-07-06 DIAGNOSIS — I480 Paroxysmal atrial fibrillation: Secondary | ICD-10-CM | POA: Diagnosis not present

## 2022-07-06 DIAGNOSIS — N2581 Secondary hyperparathyroidism of renal origin: Secondary | ICD-10-CM | POA: Diagnosis not present

## 2022-07-09 DIAGNOSIS — E039 Hypothyroidism, unspecified: Secondary | ICD-10-CM | POA: Diagnosis not present

## 2022-07-09 DIAGNOSIS — Z992 Dependence on renal dialysis: Secondary | ICD-10-CM | POA: Diagnosis not present

## 2022-07-09 DIAGNOSIS — N898 Other specified noninflammatory disorders of vagina: Secondary | ICD-10-CM | POA: Diagnosis not present

## 2022-07-09 DIAGNOSIS — E1129 Type 2 diabetes mellitus with other diabetic kidney complication: Secondary | ICD-10-CM | POA: Diagnosis not present

## 2022-07-09 DIAGNOSIS — Z23 Encounter for immunization: Secondary | ICD-10-CM | POA: Diagnosis not present

## 2022-07-09 DIAGNOSIS — D631 Anemia in chronic kidney disease: Secondary | ICD-10-CM | POA: Diagnosis not present

## 2022-07-09 DIAGNOSIS — D689 Coagulation defect, unspecified: Secondary | ICD-10-CM | POA: Diagnosis not present

## 2022-07-09 DIAGNOSIS — N2581 Secondary hyperparathyroidism of renal origin: Secondary | ICD-10-CM | POA: Diagnosis not present

## 2022-07-09 DIAGNOSIS — N186 End stage renal disease: Secondary | ICD-10-CM | POA: Diagnosis not present

## 2022-07-13 DIAGNOSIS — Z23 Encounter for immunization: Secondary | ICD-10-CM | POA: Diagnosis not present

## 2022-07-13 DIAGNOSIS — D631 Anemia in chronic kidney disease: Secondary | ICD-10-CM | POA: Diagnosis not present

## 2022-07-13 DIAGNOSIS — N2581 Secondary hyperparathyroidism of renal origin: Secondary | ICD-10-CM | POA: Diagnosis not present

## 2022-07-13 DIAGNOSIS — D689 Coagulation defect, unspecified: Secondary | ICD-10-CM | POA: Diagnosis not present

## 2022-07-13 DIAGNOSIS — N186 End stage renal disease: Secondary | ICD-10-CM | POA: Diagnosis not present

## 2022-07-13 DIAGNOSIS — Z992 Dependence on renal dialysis: Secondary | ICD-10-CM | POA: Diagnosis not present

## 2022-07-13 DIAGNOSIS — E039 Hypothyroidism, unspecified: Secondary | ICD-10-CM | POA: Diagnosis not present

## 2022-07-13 DIAGNOSIS — E1129 Type 2 diabetes mellitus with other diabetic kidney complication: Secondary | ICD-10-CM | POA: Diagnosis not present

## 2022-07-16 DIAGNOSIS — D631 Anemia in chronic kidney disease: Secondary | ICD-10-CM | POA: Diagnosis not present

## 2022-07-16 DIAGNOSIS — D689 Coagulation defect, unspecified: Secondary | ICD-10-CM | POA: Diagnosis not present

## 2022-07-16 DIAGNOSIS — N2581 Secondary hyperparathyroidism of renal origin: Secondary | ICD-10-CM | POA: Diagnosis not present

## 2022-07-16 DIAGNOSIS — Z992 Dependence on renal dialysis: Secondary | ICD-10-CM | POA: Diagnosis not present

## 2022-07-16 DIAGNOSIS — Z23 Encounter for immunization: Secondary | ICD-10-CM | POA: Diagnosis not present

## 2022-07-16 DIAGNOSIS — E1129 Type 2 diabetes mellitus with other diabetic kidney complication: Secondary | ICD-10-CM | POA: Diagnosis not present

## 2022-07-16 DIAGNOSIS — E039 Hypothyroidism, unspecified: Secondary | ICD-10-CM | POA: Diagnosis not present

## 2022-07-16 DIAGNOSIS — N186 End stage renal disease: Secondary | ICD-10-CM | POA: Diagnosis not present

## 2022-07-17 DIAGNOSIS — R498 Other voice and resonance disorders: Secondary | ICD-10-CM | POA: Diagnosis not present

## 2022-07-17 DIAGNOSIS — R041 Hemorrhage from throat: Secondary | ICD-10-CM | POA: Diagnosis not present

## 2022-07-17 DIAGNOSIS — J383 Other diseases of vocal cords: Secondary | ICD-10-CM | POA: Diagnosis not present

## 2022-07-17 DIAGNOSIS — R490 Dysphonia: Secondary | ICD-10-CM | POA: Diagnosis not present

## 2022-07-17 DIAGNOSIS — R053 Chronic cough: Secondary | ICD-10-CM | POA: Diagnosis not present

## 2022-07-18 DIAGNOSIS — E039 Hypothyroidism, unspecified: Secondary | ICD-10-CM | POA: Diagnosis not present

## 2022-07-18 DIAGNOSIS — Z992 Dependence on renal dialysis: Secondary | ICD-10-CM | POA: Diagnosis not present

## 2022-07-18 DIAGNOSIS — E1129 Type 2 diabetes mellitus with other diabetic kidney complication: Secondary | ICD-10-CM | POA: Diagnosis not present

## 2022-07-18 DIAGNOSIS — Z23 Encounter for immunization: Secondary | ICD-10-CM | POA: Diagnosis not present

## 2022-07-18 DIAGNOSIS — D689 Coagulation defect, unspecified: Secondary | ICD-10-CM | POA: Diagnosis not present

## 2022-07-18 DIAGNOSIS — D631 Anemia in chronic kidney disease: Secondary | ICD-10-CM | POA: Diagnosis not present

## 2022-07-18 DIAGNOSIS — N2581 Secondary hyperparathyroidism of renal origin: Secondary | ICD-10-CM | POA: Diagnosis not present

## 2022-07-18 DIAGNOSIS — N186 End stage renal disease: Secondary | ICD-10-CM | POA: Diagnosis not present

## 2022-07-20 DIAGNOSIS — D631 Anemia in chronic kidney disease: Secondary | ICD-10-CM | POA: Diagnosis not present

## 2022-07-20 DIAGNOSIS — D689 Coagulation defect, unspecified: Secondary | ICD-10-CM | POA: Diagnosis not present

## 2022-07-20 DIAGNOSIS — Z992 Dependence on renal dialysis: Secondary | ICD-10-CM | POA: Diagnosis not present

## 2022-07-20 DIAGNOSIS — E039 Hypothyroidism, unspecified: Secondary | ICD-10-CM | POA: Diagnosis not present

## 2022-07-20 DIAGNOSIS — N2581 Secondary hyperparathyroidism of renal origin: Secondary | ICD-10-CM | POA: Diagnosis not present

## 2022-07-20 DIAGNOSIS — E1129 Type 2 diabetes mellitus with other diabetic kidney complication: Secondary | ICD-10-CM | POA: Diagnosis not present

## 2022-07-20 DIAGNOSIS — Z23 Encounter for immunization: Secondary | ICD-10-CM | POA: Diagnosis not present

## 2022-07-20 DIAGNOSIS — N186 End stage renal disease: Secondary | ICD-10-CM | POA: Diagnosis not present

## 2022-07-23 DIAGNOSIS — N2581 Secondary hyperparathyroidism of renal origin: Secondary | ICD-10-CM | POA: Diagnosis not present

## 2022-07-23 DIAGNOSIS — T8612 Kidney transplant failure: Secondary | ICD-10-CM | POA: Diagnosis not present

## 2022-07-23 DIAGNOSIS — E039 Hypothyroidism, unspecified: Secondary | ICD-10-CM | POA: Diagnosis not present

## 2022-07-23 DIAGNOSIS — D631 Anemia in chronic kidney disease: Secondary | ICD-10-CM | POA: Diagnosis not present

## 2022-07-23 DIAGNOSIS — Z992 Dependence on renal dialysis: Secondary | ICD-10-CM | POA: Diagnosis not present

## 2022-07-23 DIAGNOSIS — N186 End stage renal disease: Secondary | ICD-10-CM | POA: Diagnosis not present

## 2022-07-23 DIAGNOSIS — I480 Paroxysmal atrial fibrillation: Secondary | ICD-10-CM | POA: Diagnosis not present

## 2022-07-23 DIAGNOSIS — E1129 Type 2 diabetes mellitus with other diabetic kidney complication: Secondary | ICD-10-CM | POA: Diagnosis not present

## 2022-07-23 DIAGNOSIS — Z23 Encounter for immunization: Secondary | ICD-10-CM | POA: Diagnosis not present

## 2022-07-23 DIAGNOSIS — D689 Coagulation defect, unspecified: Secondary | ICD-10-CM | POA: Diagnosis not present

## 2022-07-23 DIAGNOSIS — R0602 Shortness of breath: Secondary | ICD-10-CM | POA: Diagnosis not present

## 2022-07-25 DIAGNOSIS — N2581 Secondary hyperparathyroidism of renal origin: Secondary | ICD-10-CM | POA: Diagnosis not present

## 2022-07-25 DIAGNOSIS — D689 Coagulation defect, unspecified: Secondary | ICD-10-CM | POA: Diagnosis not present

## 2022-07-25 DIAGNOSIS — E039 Hypothyroidism, unspecified: Secondary | ICD-10-CM | POA: Diagnosis not present

## 2022-07-25 DIAGNOSIS — N186 End stage renal disease: Secondary | ICD-10-CM | POA: Diagnosis not present

## 2022-07-25 DIAGNOSIS — D631 Anemia in chronic kidney disease: Secondary | ICD-10-CM | POA: Diagnosis not present

## 2022-07-25 DIAGNOSIS — Z992 Dependence on renal dialysis: Secondary | ICD-10-CM | POA: Diagnosis not present

## 2022-07-27 DIAGNOSIS — E039 Hypothyroidism, unspecified: Secondary | ICD-10-CM | POA: Diagnosis not present

## 2022-07-27 DIAGNOSIS — Z992 Dependence on renal dialysis: Secondary | ICD-10-CM | POA: Diagnosis not present

## 2022-07-27 DIAGNOSIS — N186 End stage renal disease: Secondary | ICD-10-CM | POA: Diagnosis not present

## 2022-07-27 DIAGNOSIS — N2581 Secondary hyperparathyroidism of renal origin: Secondary | ICD-10-CM | POA: Diagnosis not present

## 2022-07-27 DIAGNOSIS — D631 Anemia in chronic kidney disease: Secondary | ICD-10-CM | POA: Diagnosis not present

## 2022-07-27 DIAGNOSIS — D689 Coagulation defect, unspecified: Secondary | ICD-10-CM | POA: Diagnosis not present

## 2022-07-30 DIAGNOSIS — D689 Coagulation defect, unspecified: Secondary | ICD-10-CM | POA: Diagnosis not present

## 2022-07-30 DIAGNOSIS — D631 Anemia in chronic kidney disease: Secondary | ICD-10-CM | POA: Diagnosis not present

## 2022-07-30 DIAGNOSIS — N186 End stage renal disease: Secondary | ICD-10-CM | POA: Diagnosis not present

## 2022-07-30 DIAGNOSIS — E039 Hypothyroidism, unspecified: Secondary | ICD-10-CM | POA: Diagnosis not present

## 2022-07-30 DIAGNOSIS — Z992 Dependence on renal dialysis: Secondary | ICD-10-CM | POA: Diagnosis not present

## 2022-07-30 DIAGNOSIS — N2581 Secondary hyperparathyroidism of renal origin: Secondary | ICD-10-CM | POA: Diagnosis not present

## 2022-07-31 ENCOUNTER — Encounter: Payer: Self-pay | Admitting: *Deleted

## 2022-08-01 DIAGNOSIS — D689 Coagulation defect, unspecified: Secondary | ICD-10-CM | POA: Diagnosis not present

## 2022-08-01 DIAGNOSIS — D631 Anemia in chronic kidney disease: Secondary | ICD-10-CM | POA: Diagnosis not present

## 2022-08-01 DIAGNOSIS — Z992 Dependence on renal dialysis: Secondary | ICD-10-CM | POA: Diagnosis not present

## 2022-08-01 DIAGNOSIS — E039 Hypothyroidism, unspecified: Secondary | ICD-10-CM | POA: Diagnosis not present

## 2022-08-01 DIAGNOSIS — N186 End stage renal disease: Secondary | ICD-10-CM | POA: Diagnosis not present

## 2022-08-01 DIAGNOSIS — N2581 Secondary hyperparathyroidism of renal origin: Secondary | ICD-10-CM | POA: Diagnosis not present

## 2022-08-03 DIAGNOSIS — N2581 Secondary hyperparathyroidism of renal origin: Secondary | ICD-10-CM | POA: Diagnosis not present

## 2022-08-03 DIAGNOSIS — D689 Coagulation defect, unspecified: Secondary | ICD-10-CM | POA: Diagnosis not present

## 2022-08-03 DIAGNOSIS — N186 End stage renal disease: Secondary | ICD-10-CM | POA: Diagnosis not present

## 2022-08-03 DIAGNOSIS — E039 Hypothyroidism, unspecified: Secondary | ICD-10-CM | POA: Diagnosis not present

## 2022-08-03 DIAGNOSIS — Z992 Dependence on renal dialysis: Secondary | ICD-10-CM | POA: Diagnosis not present

## 2022-08-03 DIAGNOSIS — D631 Anemia in chronic kidney disease: Secondary | ICD-10-CM | POA: Diagnosis not present

## 2022-08-06 DIAGNOSIS — D689 Coagulation defect, unspecified: Secondary | ICD-10-CM | POA: Diagnosis not present

## 2022-08-06 DIAGNOSIS — N2581 Secondary hyperparathyroidism of renal origin: Secondary | ICD-10-CM | POA: Diagnosis not present

## 2022-08-06 DIAGNOSIS — Z992 Dependence on renal dialysis: Secondary | ICD-10-CM | POA: Diagnosis not present

## 2022-08-06 DIAGNOSIS — N186 End stage renal disease: Secondary | ICD-10-CM | POA: Diagnosis not present

## 2022-08-06 DIAGNOSIS — E039 Hypothyroidism, unspecified: Secondary | ICD-10-CM | POA: Diagnosis not present

## 2022-08-06 DIAGNOSIS — D631 Anemia in chronic kidney disease: Secondary | ICD-10-CM | POA: Diagnosis not present

## 2022-08-10 DIAGNOSIS — E039 Hypothyroidism, unspecified: Secondary | ICD-10-CM | POA: Diagnosis not present

## 2022-08-10 DIAGNOSIS — Z992 Dependence on renal dialysis: Secondary | ICD-10-CM | POA: Diagnosis not present

## 2022-08-10 DIAGNOSIS — D631 Anemia in chronic kidney disease: Secondary | ICD-10-CM | POA: Diagnosis not present

## 2022-08-10 DIAGNOSIS — N2581 Secondary hyperparathyroidism of renal origin: Secondary | ICD-10-CM | POA: Diagnosis not present

## 2022-08-10 DIAGNOSIS — D689 Coagulation defect, unspecified: Secondary | ICD-10-CM | POA: Diagnosis not present

## 2022-08-10 DIAGNOSIS — N186 End stage renal disease: Secondary | ICD-10-CM | POA: Diagnosis not present

## 2022-08-12 DIAGNOSIS — N2581 Secondary hyperparathyroidism of renal origin: Secondary | ICD-10-CM | POA: Diagnosis not present

## 2022-08-12 DIAGNOSIS — D631 Anemia in chronic kidney disease: Secondary | ICD-10-CM | POA: Diagnosis not present

## 2022-08-12 DIAGNOSIS — N186 End stage renal disease: Secondary | ICD-10-CM | POA: Diagnosis not present

## 2022-08-12 DIAGNOSIS — Z992 Dependence on renal dialysis: Secondary | ICD-10-CM | POA: Diagnosis not present

## 2022-08-12 DIAGNOSIS — D689 Coagulation defect, unspecified: Secondary | ICD-10-CM | POA: Diagnosis not present

## 2022-08-12 DIAGNOSIS — E039 Hypothyroidism, unspecified: Secondary | ICD-10-CM | POA: Diagnosis not present

## 2022-08-14 DIAGNOSIS — Z992 Dependence on renal dialysis: Secondary | ICD-10-CM | POA: Diagnosis not present

## 2022-08-14 DIAGNOSIS — N2581 Secondary hyperparathyroidism of renal origin: Secondary | ICD-10-CM | POA: Diagnosis not present

## 2022-08-14 DIAGNOSIS — N186 End stage renal disease: Secondary | ICD-10-CM | POA: Diagnosis not present

## 2022-08-14 DIAGNOSIS — D689 Coagulation defect, unspecified: Secondary | ICD-10-CM | POA: Diagnosis not present

## 2022-08-14 DIAGNOSIS — D631 Anemia in chronic kidney disease: Secondary | ICD-10-CM | POA: Diagnosis not present

## 2022-08-14 DIAGNOSIS — E039 Hypothyroidism, unspecified: Secondary | ICD-10-CM | POA: Diagnosis not present

## 2022-08-17 DIAGNOSIS — N2581 Secondary hyperparathyroidism of renal origin: Secondary | ICD-10-CM | POA: Diagnosis not present

## 2022-08-17 DIAGNOSIS — E039 Hypothyroidism, unspecified: Secondary | ICD-10-CM | POA: Diagnosis not present

## 2022-08-17 DIAGNOSIS — N186 End stage renal disease: Secondary | ICD-10-CM | POA: Diagnosis not present

## 2022-08-17 DIAGNOSIS — D631 Anemia in chronic kidney disease: Secondary | ICD-10-CM | POA: Diagnosis not present

## 2022-08-17 DIAGNOSIS — D689 Coagulation defect, unspecified: Secondary | ICD-10-CM | POA: Diagnosis not present

## 2022-08-17 DIAGNOSIS — Z992 Dependence on renal dialysis: Secondary | ICD-10-CM | POA: Diagnosis not present

## 2022-08-20 DIAGNOSIS — N2581 Secondary hyperparathyroidism of renal origin: Secondary | ICD-10-CM | POA: Diagnosis not present

## 2022-08-20 DIAGNOSIS — D631 Anemia in chronic kidney disease: Secondary | ICD-10-CM | POA: Diagnosis not present

## 2022-08-20 DIAGNOSIS — N186 End stage renal disease: Secondary | ICD-10-CM | POA: Diagnosis not present

## 2022-08-20 DIAGNOSIS — Z992 Dependence on renal dialysis: Secondary | ICD-10-CM | POA: Diagnosis not present

## 2022-08-20 DIAGNOSIS — D689 Coagulation defect, unspecified: Secondary | ICD-10-CM | POA: Diagnosis not present

## 2022-08-20 DIAGNOSIS — E039 Hypothyroidism, unspecified: Secondary | ICD-10-CM | POA: Diagnosis not present

## 2022-08-22 DIAGNOSIS — N2581 Secondary hyperparathyroidism of renal origin: Secondary | ICD-10-CM | POA: Diagnosis not present

## 2022-08-22 DIAGNOSIS — Z992 Dependence on renal dialysis: Secondary | ICD-10-CM | POA: Diagnosis not present

## 2022-08-22 DIAGNOSIS — D631 Anemia in chronic kidney disease: Secondary | ICD-10-CM | POA: Diagnosis not present

## 2022-08-22 DIAGNOSIS — D689 Coagulation defect, unspecified: Secondary | ICD-10-CM | POA: Diagnosis not present

## 2022-08-22 DIAGNOSIS — E039 Hypothyroidism, unspecified: Secondary | ICD-10-CM | POA: Diagnosis not present

## 2022-08-22 DIAGNOSIS — T8612 Kidney transplant failure: Secondary | ICD-10-CM | POA: Diagnosis not present

## 2022-08-22 DIAGNOSIS — N186 End stage renal disease: Secondary | ICD-10-CM | POA: Diagnosis not present

## 2022-08-24 DIAGNOSIS — N2581 Secondary hyperparathyroidism of renal origin: Secondary | ICD-10-CM | POA: Diagnosis not present

## 2022-08-24 DIAGNOSIS — N186 End stage renal disease: Secondary | ICD-10-CM | POA: Diagnosis not present

## 2022-08-24 DIAGNOSIS — E039 Hypothyroidism, unspecified: Secondary | ICD-10-CM | POA: Diagnosis not present

## 2022-08-24 DIAGNOSIS — D631 Anemia in chronic kidney disease: Secondary | ICD-10-CM | POA: Diagnosis not present

## 2022-08-24 DIAGNOSIS — D689 Coagulation defect, unspecified: Secondary | ICD-10-CM | POA: Diagnosis not present

## 2022-08-24 DIAGNOSIS — Z992 Dependence on renal dialysis: Secondary | ICD-10-CM | POA: Diagnosis not present

## 2022-08-27 DIAGNOSIS — D689 Coagulation defect, unspecified: Secondary | ICD-10-CM | POA: Diagnosis not present

## 2022-08-27 DIAGNOSIS — N2581 Secondary hyperparathyroidism of renal origin: Secondary | ICD-10-CM | POA: Diagnosis not present

## 2022-08-27 DIAGNOSIS — E039 Hypothyroidism, unspecified: Secondary | ICD-10-CM | POA: Diagnosis not present

## 2022-08-27 DIAGNOSIS — D631 Anemia in chronic kidney disease: Secondary | ICD-10-CM | POA: Diagnosis not present

## 2022-08-27 DIAGNOSIS — N186 End stage renal disease: Secondary | ICD-10-CM | POA: Diagnosis not present

## 2022-08-27 DIAGNOSIS — Z992 Dependence on renal dialysis: Secondary | ICD-10-CM | POA: Diagnosis not present

## 2022-08-29 DIAGNOSIS — N186 End stage renal disease: Secondary | ICD-10-CM | POA: Diagnosis not present

## 2022-08-29 DIAGNOSIS — D689 Coagulation defect, unspecified: Secondary | ICD-10-CM | POA: Diagnosis not present

## 2022-08-29 DIAGNOSIS — D631 Anemia in chronic kidney disease: Secondary | ICD-10-CM | POA: Diagnosis not present

## 2022-08-29 DIAGNOSIS — Z992 Dependence on renal dialysis: Secondary | ICD-10-CM | POA: Diagnosis not present

## 2022-08-29 DIAGNOSIS — N2581 Secondary hyperparathyroidism of renal origin: Secondary | ICD-10-CM | POA: Diagnosis not present

## 2022-08-29 DIAGNOSIS — E039 Hypothyroidism, unspecified: Secondary | ICD-10-CM | POA: Diagnosis not present

## 2022-08-29 NOTE — Progress Notes (Signed)
Cardiology Office Note   Date:  08/30/2022   ID:  Kimberly [name], DOB 04-09-1956, MRN [id number]  PCP:  [name], MD  Cardiologist:   Minus Breeding, MD Referring:  [name], MD  Chief Complaint  Patient presents with   Shortness of Breath      History of Present Illness: Kimberly [name] is a 66 y.o. female who presents for evaluation of SOB and a murmur.   She has "carcinoid tumorlets" in her lung thought not to be clinically significant.  She has asthma.  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 )  Past cardiac history includes an echocardiogram in 2018 without evidence of left ventricular dysfunction.  There was some mild aortic valve calcification.  She did see Dr. Rayann Heman years ago.  She had pericarditis and had brief atrial fibrillation at that time.  She had asthma and chronic cough.  She did have distant chest discomfort and perfusion study that was apparently negative but I do not see these results.  She is dialysis dependent.  I saw her and she had increased SOB.   She had a low risk perfusion study without ischemia after I first saw her.   She had a normal EF on echo with mild aortic valve sclerosis.     I saw her in October for SOB.    She wore a monitor that had no atrial fib.  There were some runs of SVT.  She still gets the palpitations.  However, back and reviewed these and these were either during PVCs, sometimes with normal sinus rhythm and occasionally with brief runs of SVT.  She says she has not had any presyncope or syncope.  She is not having any chest discomfort.  She is getting short of breath as she was before but this is not any different.  Past Medical History:  Diagnosis Date   Anemia    Arthritis    Asthma    Complication of anesthesia    Slow to awaken and AMS- took 1 month to memory and recall to return   COVID    Diverticulosis    First degree AV block    GERD (gastroesophageal  reflux disease)    History of blood transfusion    History of chronic cough    History of hiatal hernia    History of hoarseness    Hypertension    history; resolved on dialysis   Hypothyroidism    IgA nephropathy    ESRD - Hemo TTH Sat   Premature atrial beat    Premature ventricular beat    Renal transplant failure and rejection 2012   Shingles 03/2011   Sleep apnea    uses a mouth guard   Stroke [facility]) October 2010, March 2013   cerebral aneurysm, 07/2017    Past Surgical History:  Procedure Laterality Date   AV FISTULA PLACEMENT Left 05/20/2018   Procedure: INSERTION OF ARTERIOVENOUS (AV) GORE-TEX GRAFT LEFT  ARM;  Surgeon: [name], MD;  Location: [place];  Service: Vascular;  Laterality: Left;   AV FISTULA PLACEMENT Right 10/26/2021   Procedure: INSERTION OF RIGHT ARM ARTERIOVENOUS (AV) GORE-TEX GRAFT;  Surgeon: [name], MD;  Location: [place];  Service: Vascular;  Laterality: Right;   CAPD INSERTION  08/25/2008   open, Dr [name]   CAPD INSERTION N/A 12/14/2013   Procedure: LAPAROSCOPIC INSERTION CONTINUOUS AMBULATORY PERITONEAL DIALYSIS  (CAPD) CATHETER;  Surgeon: [name], MD;  Location: [facility];  Service: General;  Laterality: N/A;   CAPD REMOVAL  03/22/2010   Dr [name]   COLONOSCOPY     ESOPHAGOGASTRODUODENOSCOPY     EYE SURGERY Bilateral    radial keratotomy   Hemodialysis catheter Right 02/13/2018   INSERTION OF MESH N/A 12/14/2013   Procedure: INSERTION OF MESH;  Surgeon: [name], MD;  Location: [place];  Service: General;  Laterality: N/A;   KIDNEY TRANSPLANT Right 01/2010   [facility]   LAPAROSCOPIC LYSIS OF ADHESIONS N/A 12/14/2013   Procedure: LAPAROSCOPIC LYSIS OF ADHESIONS;  Surgeon: [name], MD;  Location: [place];  Service: General;  Laterality: N/A;   PATCH ANGIOPLASTY  06/29/2021   Procedure: PATCH ANGIOPLASTY;  Surgeon: Cherre Robins, MD;  Location: [place];  Service: Vascular;;   THROMBECTOMY AND REVISION OF  ARTERIOVENTOUS (AV) GORETEX  GRAFT Left 06/29/2021   Procedure: REVISION OF LEFT UPPER EXTREMITY ARTERIOVENOUS GORETEX GRAFT WITH THROMBECTOMY;  Surgeon: Cherre Robins, MD;  Location: [place];  Service: Vascular;  Laterality: Left;  PERIPHERAL NERVE BLOCK   TOTAL HIP ARTHROPLASTY Left 02/06/2019   Procedure: TOTAL HIP ARTHROPLASTY ANTERIOR APPROACH;  Surgeon: Rod Can, MD;  Location: [place];  Service: Orthopedics;  Laterality: Left;   UMBILICAL HERNIA REPAIR  10/25/2009   open with mesh,  Dr [name]   URETER REVISION  04/2011   [facility]   VENTRAL HERNIA REPAIR N/A 12/14/2013   Procedure: LAPAROSCOPIC VENTRAL HERNIA;  Surgeon: [name], MD;  Location: [place];  Service: General;  Laterality: N/A;   [place] (VATS)/WEDGE RESECTION Left 09/01/2014   Procedure: VIDEO ASSISTED THORACOSCOPY (VATS)/WEDGE RESECTION;  Surgeon: [name], MD;  Location: [place];  Service: Thoracic;  Laterality: Left;   VIDEO BRONCHOSCOPY Bilateral 07/05/2014   Procedure: VIDEO BRONCHOSCOPY WITH FLUORO;  Surgeon: [name], MD;  Location: [facility];  Service: Cardiopulmonary;  Laterality: Bilateral;     Current Outpatient Medications  Medication Sig Dispense Refill   albuterol (VENTOLIN HFA) 108 (90 Base) MCG/ACT inhaler Inhale 2 puffs into the lungs every 6 (six) hours as needed for shortness of breath or wheezing (Asthma).     amLODipine (NORVASC) 10 MG tablet Take 10 mg by mouth daily.     budesonide-formoterol (SYMBICORT) 160-4.5 MCG/ACT inhaler Inhale 2 puffs into the lungs 2 (two) times daily.     cinacalcet (SENSIPAR) 30 MG tablet Take 30 mg by mouth daily after supper.     EPOETIN ALFA IJ Inject into the skin See admin instructions. Inject  subcutaneously at dialysis as needed for low hemoglobin     Garlic 3338 MG CAPS Take 1,000 mg by mouth in the morning, at noon, and at bedtime.     hydroxychloroquine (PLAQUENIL) 200 MG tablet Take 200 mg by mouth daily.      levothyroxine (SYNTHROID, LEVOTHROID) 50 MCG tablet Take 50-75 mcg by mouth See admin instructions. Take  1 1/2 tablets (75 mcg) by mouth daily on Tuesday and Thursday nights, take 1 tablet (50 mcg) by mouth daily on all other nights of the week     losartan (COZAAR) 100 MG tablet Take 100 mg by mouth daily.     Multiple Vitamins-Minerals (ZINC PO) Take 22 mg by mouth daily.     OVER THE COUNTER MEDICATION Take 4 capsules by mouth 2 (two) times a day. Juice plus - 1 fruit, 1 vegetable, 1 berries, 1 omega - twice daily     predniSONE (DELTASONE) 5 MG  tablet Take 5 mg by mouth daily with breakfast.     Probiotic Product (PROBIOTIC DAILY PO) Take 1 tablet by mouth daily.     PSYLLIUM PO Take 3 capsules by mouth daily at 2 PM.      SEVELAMER HCL PO Take 2 tablets by mouth in the morning and at bedtime.     tiotropium (SPIRIVA) 18 MCG inhalation capsule Place 18 mcg into inhaler and inhale daily.     No current facility-administered medications for this visit.    Allergies:   Mircera [methoxy polyethylene glycol-epoetin beta], Mycophenolate mofetil, Dapsone, Pregabalin, Sulfonamide derivatives, and Tramadol   ROS:  Please see the history of present illness.   Otherwise, review of systems are positive for none.   All other systems are reviewed and negative.    PHYSICAL EXAM: VS:  BP 128/74 (BP Location: Left Wrist, Patient Position: Sitting, Cuff Size: Normal)   Pulse (!) 57   Ht '5\' 5"'$  (1.651 m)   Wt 108 lb (49 kg)   BMI 17.97 kg/m  , BMI Body mass index is 17.97 kg/m. GENERAL:  Well appearing, very thin NECK:  No jugular venous distention, waveform within normal limits, carotid upstroke brisk and symmetric, no bruits, no thyromegaly LUNGS:  Clear to auscultation bilaterally CHEST:  Unremarkable HEART:  PMI not displaced or sustained,S1 and S2 within normal limits, no S3, no S4, no clicks, no rubs, 2 out of 6 apical systolic murmur, to and fro murmur radiating throughout the precordium and up  into the neck  ABD:  Flat, positive bowel sounds normal in frequency in pitch, no bruits, no rebound, no guarding, no midline pulsatile mass, no hepatomegaly, no splenomegaly EXT:  2 plus pulses throughout, no edema, no cyanosis no clubbing right upper extremity dialysis graft with thrill and bruit  EKG:  EKG is not ordered today. NA   Recent Labs: 09/22/2021: ALT 7; Platelets 293 10/26/2021: BUN 41; Creatinine, Ser 6.20; Hemoglobin 12.2; Potassium 4.7; Sodium 136    Lipid Panel    Component Value Date/Time   CHOL 148 11/30/2011 0016   TRIG 89 11/30/2011 0016   HDL 54 11/30/2011 0016   CHOLHDL 2.7 11/30/2011 0016   VLDL 18 11/30/2011 0016   LDLCALC 76 11/30/2011 0016      Wt Readings from Last 3 Encounters:  08/30/22 108 lb (49 kg)  07/01/22 112 lb 6.4 oz (51 kg)  06/12/22 118 lb (53.5 kg)      Other studies Reviewed: Additional studies/ records that were reviewed today include:  None. Review of the above records demonstrates:  NA   ASSESSMENT AND PLAN:  SOB: I think her breathing is probably primarily pulmonary.  She has had extensive cardiac workup with no clear etiology.  No further cardiac workup.  Murmur:   She does have some mild aortic sclerosis but also a to and fro murmur related to her dialysis catheter.  Atrial fib:    I do not have any evidence that she is having paroxysmal atrial fibrillation.  We talked quite a bit about the palpitation she is having and she would choose not to have management with medications for this as they are not particularly symptomatic.   HTN:   Her blood pressure has been labile.  No change in therapy.    Diastolic dysfunction:   She does have some diastolic dysfunction but her volume is managed through dialysis.   Current medicines are reviewed at length with the patient today.  The patient does not  have concerns regarding medicines.  The following changes have been made: None  Labs/ tests ordered today include:   None  No  orders of the defined types were placed in this encounter.    Disposition:   FU with me as needed  Signed, Minus Breeding, MD  08/30/2022 9:56 AM    [facility]

## 2022-08-30 ENCOUNTER — Ambulatory Visit: Payer: Medicare Other | Attending: Cardiology | Admitting: Cardiology

## 2022-08-30 ENCOUNTER — Encounter: Payer: Self-pay | Admitting: Cardiology

## 2022-08-30 VITALS — BP 128/74 | HR 57 | Ht 65.0 in | Wt 108.0 lb

## 2022-08-30 DIAGNOSIS — I10 Essential (primary) hypertension: Secondary | ICD-10-CM

## 2022-08-30 DIAGNOSIS — R0602 Shortness of breath: Secondary | ICD-10-CM | POA: Diagnosis not present

## 2022-08-30 DIAGNOSIS — I5032 Chronic diastolic (congestive) heart failure: Secondary | ICD-10-CM | POA: Diagnosis not present

## 2022-08-30 NOTE — Patient Instructions (Signed)
Medication Instructions:   Your physician recommends that you continue on your current medications as directed. Please refer to the Current Medication list given to you today.  *If you need a refill on your cardiac medications before your next appointment, please call your pharmacy*  Lab Work: NONE ordered at this time of appointment   If you have labs (blood work) drawn today and your tests are completely normal, you will receive your results only by: [facility] (if you have MyChart) OR A paper copy in the mail If you have any lab test that is abnormal or we need to change your treatment, we will call you to review the results.  Testing/Procedures: NONE ordered at this time of appointment   Follow-Up: At [facility], you and your health needs are our priority.  As part of our continuing mission to provide you with exceptional heart care, we have created designated Provider Care Teams.  These Care Teams include your primary Cardiologist (physician) and Advanced Practice Providers (APPs -  Physician Assistants and Nurse Practitioners) who all work together to provide you with the care you need, when you need it.  Your next appointment:   As needed if symptoms worsen or fail to improve    The format for your next appointment:   In Person  Provider:   Minus Breeding, MD     Other Instructions  Important Information About Sugar

## 2022-08-31 DIAGNOSIS — D689 Coagulation defect, unspecified: Secondary | ICD-10-CM | POA: Diagnosis not present

## 2022-08-31 DIAGNOSIS — N2581 Secondary hyperparathyroidism of renal origin: Secondary | ICD-10-CM | POA: Diagnosis not present

## 2022-08-31 DIAGNOSIS — N186 End stage renal disease: Secondary | ICD-10-CM | POA: Diagnosis not present

## 2022-08-31 DIAGNOSIS — E039 Hypothyroidism, unspecified: Secondary | ICD-10-CM | POA: Diagnosis not present

## 2022-08-31 DIAGNOSIS — Z992 Dependence on renal dialysis: Secondary | ICD-10-CM | POA: Diagnosis not present

## 2022-08-31 DIAGNOSIS — D631 Anemia in chronic kidney disease: Secondary | ICD-10-CM | POA: Diagnosis not present

## 2022-09-02 DIAGNOSIS — R490 Dysphonia: Secondary | ICD-10-CM | POA: Diagnosis not present

## 2022-09-03 DIAGNOSIS — N186 End stage renal disease: Secondary | ICD-10-CM | POA: Diagnosis not present

## 2022-09-03 DIAGNOSIS — E039 Hypothyroidism, unspecified: Secondary | ICD-10-CM | POA: Diagnosis not present

## 2022-09-03 DIAGNOSIS — N2581 Secondary hyperparathyroidism of renal origin: Secondary | ICD-10-CM | POA: Diagnosis not present

## 2022-09-03 DIAGNOSIS — D689 Coagulation defect, unspecified: Secondary | ICD-10-CM | POA: Diagnosis not present

## 2022-09-03 DIAGNOSIS — Z992 Dependence on renal dialysis: Secondary | ICD-10-CM | POA: Diagnosis not present

## 2022-09-03 DIAGNOSIS — D631 Anemia in chronic kidney disease: Secondary | ICD-10-CM | POA: Diagnosis not present

## 2022-09-05 DIAGNOSIS — N2581 Secondary hyperparathyroidism of renal origin: Secondary | ICD-10-CM | POA: Diagnosis not present

## 2022-09-05 DIAGNOSIS — N186 End stage renal disease: Secondary | ICD-10-CM | POA: Diagnosis not present

## 2022-09-05 DIAGNOSIS — D631 Anemia in chronic kidney disease: Secondary | ICD-10-CM | POA: Diagnosis not present

## 2022-09-05 DIAGNOSIS — D689 Coagulation defect, unspecified: Secondary | ICD-10-CM | POA: Diagnosis not present

## 2022-09-05 DIAGNOSIS — Z992 Dependence on renal dialysis: Secondary | ICD-10-CM | POA: Diagnosis not present

## 2022-09-05 DIAGNOSIS — E039 Hypothyroidism, unspecified: Secondary | ICD-10-CM | POA: Diagnosis not present

## 2022-09-07 DIAGNOSIS — D689 Coagulation defect, unspecified: Secondary | ICD-10-CM | POA: Diagnosis not present

## 2022-09-07 DIAGNOSIS — N186 End stage renal disease: Secondary | ICD-10-CM | POA: Diagnosis not present

## 2022-09-07 DIAGNOSIS — Z992 Dependence on renal dialysis: Secondary | ICD-10-CM | POA: Diagnosis not present

## 2022-09-07 DIAGNOSIS — E039 Hypothyroidism, unspecified: Secondary | ICD-10-CM | POA: Diagnosis not present

## 2022-09-07 DIAGNOSIS — N2581 Secondary hyperparathyroidism of renal origin: Secondary | ICD-10-CM | POA: Diagnosis not present

## 2022-09-07 DIAGNOSIS — D631 Anemia in chronic kidney disease: Secondary | ICD-10-CM | POA: Diagnosis not present

## 2022-09-10 DIAGNOSIS — E039 Hypothyroidism, unspecified: Secondary | ICD-10-CM | POA: Diagnosis not present

## 2022-09-10 DIAGNOSIS — Z992 Dependence on renal dialysis: Secondary | ICD-10-CM | POA: Diagnosis not present

## 2022-09-10 DIAGNOSIS — N186 End stage renal disease: Secondary | ICD-10-CM | POA: Diagnosis not present

## 2022-09-10 DIAGNOSIS — U071 COVID-19: Secondary | ICD-10-CM | POA: Diagnosis not present

## 2022-09-10 DIAGNOSIS — N2581 Secondary hyperparathyroidism of renal origin: Secondary | ICD-10-CM | POA: Diagnosis not present

## 2022-09-10 DIAGNOSIS — D689 Coagulation defect, unspecified: Secondary | ICD-10-CM | POA: Diagnosis not present

## 2022-09-10 DIAGNOSIS — D631 Anemia in chronic kidney disease: Secondary | ICD-10-CM | POA: Diagnosis not present

## 2022-09-12 DIAGNOSIS — E039 Hypothyroidism, unspecified: Secondary | ICD-10-CM | POA: Diagnosis not present

## 2022-09-12 DIAGNOSIS — N2581 Secondary hyperparathyroidism of renal origin: Secondary | ICD-10-CM | POA: Diagnosis not present

## 2022-09-12 DIAGNOSIS — N186 End stage renal disease: Secondary | ICD-10-CM | POA: Diagnosis not present

## 2022-09-12 DIAGNOSIS — D689 Coagulation defect, unspecified: Secondary | ICD-10-CM | POA: Diagnosis not present

## 2022-09-12 DIAGNOSIS — D631 Anemia in chronic kidney disease: Secondary | ICD-10-CM | POA: Diagnosis not present

## 2022-09-12 DIAGNOSIS — Z992 Dependence on renal dialysis: Secondary | ICD-10-CM | POA: Diagnosis not present

## 2022-09-14 DIAGNOSIS — N186 End stage renal disease: Secondary | ICD-10-CM | POA: Diagnosis not present

## 2022-09-14 DIAGNOSIS — N2581 Secondary hyperparathyroidism of renal origin: Secondary | ICD-10-CM | POA: Diagnosis not present

## 2022-09-14 DIAGNOSIS — D631 Anemia in chronic kidney disease: Secondary | ICD-10-CM | POA: Diagnosis not present

## 2022-09-14 DIAGNOSIS — D689 Coagulation defect, unspecified: Secondary | ICD-10-CM | POA: Diagnosis not present

## 2022-09-14 DIAGNOSIS — E039 Hypothyroidism, unspecified: Secondary | ICD-10-CM | POA: Diagnosis not present

## 2022-09-14 DIAGNOSIS — Z992 Dependence on renal dialysis: Secondary | ICD-10-CM | POA: Diagnosis not present

## 2022-09-17 DIAGNOSIS — N2581 Secondary hyperparathyroidism of renal origin: Secondary | ICD-10-CM | POA: Diagnosis not present

## 2022-09-17 DIAGNOSIS — Z992 Dependence on renal dialysis: Secondary | ICD-10-CM | POA: Diagnosis not present

## 2022-09-17 DIAGNOSIS — D689 Coagulation defect, unspecified: Secondary | ICD-10-CM | POA: Diagnosis not present

## 2022-09-17 DIAGNOSIS — E039 Hypothyroidism, unspecified: Secondary | ICD-10-CM | POA: Diagnosis not present

## 2022-09-17 DIAGNOSIS — N186 End stage renal disease: Secondary | ICD-10-CM | POA: Diagnosis not present

## 2022-09-17 DIAGNOSIS — D631 Anemia in chronic kidney disease: Secondary | ICD-10-CM | POA: Diagnosis not present

## 2022-09-17 DIAGNOSIS — U071 COVID-19: Secondary | ICD-10-CM | POA: Diagnosis not present

## 2022-09-17 DIAGNOSIS — Z1152 Encounter for screening for COVID-19: Secondary | ICD-10-CM | POA: Diagnosis not present

## 2022-09-19 DIAGNOSIS — N2581 Secondary hyperparathyroidism of renal origin: Secondary | ICD-10-CM | POA: Diagnosis not present

## 2022-09-19 DIAGNOSIS — E039 Hypothyroidism, unspecified: Secondary | ICD-10-CM | POA: Diagnosis not present

## 2022-09-19 DIAGNOSIS — Z992 Dependence on renal dialysis: Secondary | ICD-10-CM | POA: Diagnosis not present

## 2022-09-19 DIAGNOSIS — N186 End stage renal disease: Secondary | ICD-10-CM | POA: Diagnosis not present

## 2022-09-19 DIAGNOSIS — D689 Coagulation defect, unspecified: Secondary | ICD-10-CM | POA: Diagnosis not present

## 2022-09-19 DIAGNOSIS — D631 Anemia in chronic kidney disease: Secondary | ICD-10-CM | POA: Diagnosis not present

## 2022-09-21 DIAGNOSIS — Z992 Dependence on renal dialysis: Secondary | ICD-10-CM | POA: Diagnosis not present

## 2022-09-21 DIAGNOSIS — E039 Hypothyroidism, unspecified: Secondary | ICD-10-CM | POA: Diagnosis not present

## 2022-09-21 DIAGNOSIS — D631 Anemia in chronic kidney disease: Secondary | ICD-10-CM | POA: Diagnosis not present

## 2022-09-21 DIAGNOSIS — N2581 Secondary hyperparathyroidism of renal origin: Secondary | ICD-10-CM | POA: Diagnosis not present

## 2022-09-21 DIAGNOSIS — N186 End stage renal disease: Secondary | ICD-10-CM | POA: Diagnosis not present

## 2022-09-21 DIAGNOSIS — D689 Coagulation defect, unspecified: Secondary | ICD-10-CM | POA: Diagnosis not present

## 2022-09-22 DIAGNOSIS — Z992 Dependence on renal dialysis: Secondary | ICD-10-CM | POA: Diagnosis not present

## 2022-09-22 DIAGNOSIS — T8612 Kidney transplant failure: Secondary | ICD-10-CM | POA: Diagnosis not present

## 2022-09-22 DIAGNOSIS — N186 End stage renal disease: Secondary | ICD-10-CM | POA: Diagnosis not present

## 2022-09-24 DIAGNOSIS — Z992 Dependence on renal dialysis: Secondary | ICD-10-CM | POA: Diagnosis not present

## 2022-09-24 DIAGNOSIS — D631 Anemia in chronic kidney disease: Secondary | ICD-10-CM | POA: Diagnosis not present

## 2022-09-24 DIAGNOSIS — N186 End stage renal disease: Secondary | ICD-10-CM | POA: Diagnosis not present

## 2022-09-24 DIAGNOSIS — S41101A Unspecified open wound of right upper arm, initial encounter: Secondary | ICD-10-CM | POA: Diagnosis not present

## 2022-09-24 DIAGNOSIS — D689 Coagulation defect, unspecified: Secondary | ICD-10-CM | POA: Diagnosis not present

## 2022-09-24 DIAGNOSIS — E1129 Type 2 diabetes mellitus with other diabetic kidney complication: Secondary | ICD-10-CM | POA: Diagnosis not present

## 2022-09-24 DIAGNOSIS — R7881 Bacteremia: Secondary | ICD-10-CM | POA: Diagnosis not present

## 2022-09-24 DIAGNOSIS — E039 Hypothyroidism, unspecified: Secondary | ICD-10-CM | POA: Diagnosis not present

## 2022-09-24 DIAGNOSIS — N2581 Secondary hyperparathyroidism of renal origin: Secondary | ICD-10-CM | POA: Diagnosis not present

## 2022-09-27 DIAGNOSIS — R490 Dysphonia: Secondary | ICD-10-CM | POA: Diagnosis not present

## 2022-09-28 DIAGNOSIS — E1129 Type 2 diabetes mellitus with other diabetic kidney complication: Secondary | ICD-10-CM | POA: Diagnosis not present

## 2022-09-28 DIAGNOSIS — N186 End stage renal disease: Secondary | ICD-10-CM | POA: Diagnosis not present

## 2022-09-28 DIAGNOSIS — D631 Anemia in chronic kidney disease: Secondary | ICD-10-CM | POA: Diagnosis not present

## 2022-09-28 DIAGNOSIS — S41101A Unspecified open wound of right upper arm, initial encounter: Secondary | ICD-10-CM | POA: Diagnosis not present

## 2022-09-28 DIAGNOSIS — D689 Coagulation defect, unspecified: Secondary | ICD-10-CM | POA: Diagnosis not present

## 2022-09-28 DIAGNOSIS — Z992 Dependence on renal dialysis: Secondary | ICD-10-CM | POA: Diagnosis not present

## 2022-09-28 DIAGNOSIS — N2581 Secondary hyperparathyroidism of renal origin: Secondary | ICD-10-CM | POA: Diagnosis not present

## 2022-09-28 DIAGNOSIS — E039 Hypothyroidism, unspecified: Secondary | ICD-10-CM | POA: Diagnosis not present

## 2022-09-28 DIAGNOSIS — R7881 Bacteremia: Secondary | ICD-10-CM | POA: Diagnosis not present

## 2022-10-01 DIAGNOSIS — N186 End stage renal disease: Secondary | ICD-10-CM | POA: Diagnosis not present

## 2022-10-01 DIAGNOSIS — Z992 Dependence on renal dialysis: Secondary | ICD-10-CM | POA: Diagnosis not present

## 2022-10-01 DIAGNOSIS — N2581 Secondary hyperparathyroidism of renal origin: Secondary | ICD-10-CM | POA: Diagnosis not present

## 2022-10-01 DIAGNOSIS — D689 Coagulation defect, unspecified: Secondary | ICD-10-CM | POA: Diagnosis not present

## 2022-10-01 DIAGNOSIS — R7881 Bacteremia: Secondary | ICD-10-CM | POA: Diagnosis not present

## 2022-10-01 DIAGNOSIS — E1129 Type 2 diabetes mellitus with other diabetic kidney complication: Secondary | ICD-10-CM | POA: Diagnosis not present

## 2022-10-01 DIAGNOSIS — D631 Anemia in chronic kidney disease: Secondary | ICD-10-CM | POA: Diagnosis not present

## 2022-10-01 DIAGNOSIS — E039 Hypothyroidism, unspecified: Secondary | ICD-10-CM | POA: Diagnosis not present

## 2022-10-01 DIAGNOSIS — S41101A Unspecified open wound of right upper arm, initial encounter: Secondary | ICD-10-CM | POA: Diagnosis not present

## 2022-10-03 DIAGNOSIS — N2581 Secondary hyperparathyroidism of renal origin: Secondary | ICD-10-CM | POA: Diagnosis not present

## 2022-10-03 DIAGNOSIS — Z992 Dependence on renal dialysis: Secondary | ICD-10-CM | POA: Diagnosis not present

## 2022-10-03 DIAGNOSIS — R7881 Bacteremia: Secondary | ICD-10-CM | POA: Diagnosis not present

## 2022-10-03 DIAGNOSIS — E039 Hypothyroidism, unspecified: Secondary | ICD-10-CM | POA: Diagnosis not present

## 2022-10-03 DIAGNOSIS — E1129 Type 2 diabetes mellitus with other diabetic kidney complication: Secondary | ICD-10-CM | POA: Diagnosis not present

## 2022-10-03 DIAGNOSIS — D689 Coagulation defect, unspecified: Secondary | ICD-10-CM | POA: Diagnosis not present

## 2022-10-03 DIAGNOSIS — D631 Anemia in chronic kidney disease: Secondary | ICD-10-CM | POA: Diagnosis not present

## 2022-10-03 DIAGNOSIS — S41101A Unspecified open wound of right upper arm, initial encounter: Secondary | ICD-10-CM | POA: Diagnosis not present

## 2022-10-03 DIAGNOSIS — N186 End stage renal disease: Secondary | ICD-10-CM | POA: Diagnosis not present

## 2022-10-05 DIAGNOSIS — S41101A Unspecified open wound of right upper arm, initial encounter: Secondary | ICD-10-CM | POA: Diagnosis not present

## 2022-10-05 DIAGNOSIS — N186 End stage renal disease: Secondary | ICD-10-CM | POA: Diagnosis not present

## 2022-10-05 DIAGNOSIS — E039 Hypothyroidism, unspecified: Secondary | ICD-10-CM | POA: Diagnosis not present

## 2022-10-05 DIAGNOSIS — R7881 Bacteremia: Secondary | ICD-10-CM | POA: Diagnosis not present

## 2022-10-05 DIAGNOSIS — Z992 Dependence on renal dialysis: Secondary | ICD-10-CM | POA: Diagnosis not present

## 2022-10-05 DIAGNOSIS — E1129 Type 2 diabetes mellitus with other diabetic kidney complication: Secondary | ICD-10-CM | POA: Diagnosis not present

## 2022-10-05 DIAGNOSIS — D631 Anemia in chronic kidney disease: Secondary | ICD-10-CM | POA: Diagnosis not present

## 2022-10-05 DIAGNOSIS — N2581 Secondary hyperparathyroidism of renal origin: Secondary | ICD-10-CM | POA: Diagnosis not present

## 2022-10-05 DIAGNOSIS — D689 Coagulation defect, unspecified: Secondary | ICD-10-CM | POA: Diagnosis not present

## 2022-10-08 DIAGNOSIS — Z992 Dependence on renal dialysis: Secondary | ICD-10-CM | POA: Diagnosis not present

## 2022-10-08 DIAGNOSIS — N2581 Secondary hyperparathyroidism of renal origin: Secondary | ICD-10-CM | POA: Diagnosis not present

## 2022-10-08 DIAGNOSIS — E1129 Type 2 diabetes mellitus with other diabetic kidney complication: Secondary | ICD-10-CM | POA: Diagnosis not present

## 2022-10-08 DIAGNOSIS — N186 End stage renal disease: Secondary | ICD-10-CM | POA: Diagnosis not present

## 2022-10-08 DIAGNOSIS — S41101A Unspecified open wound of right upper arm, initial encounter: Secondary | ICD-10-CM | POA: Diagnosis not present

## 2022-10-08 DIAGNOSIS — E039 Hypothyroidism, unspecified: Secondary | ICD-10-CM | POA: Diagnosis not present

## 2022-10-08 DIAGNOSIS — D689 Coagulation defect, unspecified: Secondary | ICD-10-CM | POA: Diagnosis not present

## 2022-10-08 DIAGNOSIS — D631 Anemia in chronic kidney disease: Secondary | ICD-10-CM | POA: Diagnosis not present

## 2022-10-08 DIAGNOSIS — R7881 Bacteremia: Secondary | ICD-10-CM | POA: Diagnosis not present

## 2022-10-10 ENCOUNTER — Ambulatory Visit: Payer: Medicare Other | Admitting: Physician Assistant

## 2022-10-10 VITALS — BP 153/81 | HR 60 | Temp 98.0°F | Resp 20 | Ht 65.0 in | Wt 107.0 lb

## 2022-10-10 DIAGNOSIS — Z992 Dependence on renal dialysis: Secondary | ICD-10-CM | POA: Diagnosis not present

## 2022-10-10 DIAGNOSIS — N186 End stage renal disease: Secondary | ICD-10-CM

## 2022-10-10 DIAGNOSIS — T827XXD Infection and inflammatory reaction due to other cardiac and vascular devices, implants and grafts, subsequent encounter: Secondary | ICD-10-CM

## 2022-10-10 DIAGNOSIS — E1129 Type 2 diabetes mellitus with other diabetic kidney complication: Secondary | ICD-10-CM | POA: Diagnosis not present

## 2022-10-10 DIAGNOSIS — S41101A Unspecified open wound of right upper arm, initial encounter: Secondary | ICD-10-CM | POA: Diagnosis not present

## 2022-10-10 DIAGNOSIS — R7881 Bacteremia: Secondary | ICD-10-CM | POA: Diagnosis not present

## 2022-10-10 DIAGNOSIS — D631 Anemia in chronic kidney disease: Secondary | ICD-10-CM | POA: Diagnosis not present

## 2022-10-10 DIAGNOSIS — D689 Coagulation defect, unspecified: Secondary | ICD-10-CM | POA: Diagnosis not present

## 2022-10-10 DIAGNOSIS — N2581 Secondary hyperparathyroidism of renal origin: Secondary | ICD-10-CM | POA: Diagnosis not present

## 2022-10-10 DIAGNOSIS — E039 Hypothyroidism, unspecified: Secondary | ICD-10-CM | POA: Diagnosis not present

## 2022-10-10 NOTE — Progress Notes (Signed)
Established Dialysis Access   History of Present Illness   Kimberly [name] is a 67 y.o. (1956/02/27) female who presents for evaluation for possible infected AV graft.  She has a known occluded left arm AV graft that was placed by Dr. Donzetta Matters in August of 2029. She required a revision by Dr. Stanford Breed in October of 2022 but ultimately the graft occluded.  She has had some scabs/ulceration of the skin overlying the graft over the past year, but the wounds have intermittently healed and there has been no signs of acute infection. Removal of the graft has not been recommended up to this point.  Today she reports continued drainage from small hole overlying graft. She reports a yellow- brown drainage. She keeps a Band-Aid on this. She reported this to Dr. Joelyn Oms at her [facility] who took cultures today. She was started on antibiotics with dialysis session today. She denies any chills but says she frequently has fevers. When asked if she actually takes her temperature she she says she can tell when she has a fever and when she does take her temperature she reports its usually around 99. She otherwise denies any signs or symptoms of steal. She expresses some frustration with her prior revision of this arm and her continued "draining hole" and scabs on her left arm.   She is s/p right brachial artery to axillary vein graft with PTFE on 10/26/21 by Dr. Donzetta Matters.  She currently dialyzes TTS via right AV graft at the [facility] location  The patient's PMH, PSH, SH, and FamHx were reviewed and are unchanged from her last visit.  Current Outpatient Medications  Medication Sig Dispense Refill   albuterol (VENTOLIN HFA) 108 (90 Base) MCG/ACT inhaler Inhale 2 puffs into the lungs every 6 (six) hours as needed for shortness of breath or wheezing (Asthma).     amLODipine (NORVASC) 10 MG tablet Take 10 mg by mouth daily.     budesonide-formoterol (SYMBICORT) 160-4.5 MCG/ACT inhaler Inhale 2 puffs into the lungs 2  (two) times daily.     cinacalcet (SENSIPAR) 30 MG tablet Take 30 mg by mouth daily after supper.     EPOETIN ALFA IJ Inject into the skin See admin instructions. Inject  subcutaneously at dialysis as needed for low hemoglobin     Garlic 9450 MG CAPS Take 1,000 mg by mouth in the morning, at noon, and at bedtime.     hydroxychloroquine (PLAQUENIL) 200 MG tablet Take 200 mg by mouth daily.     levothyroxine (SYNTHROID, LEVOTHROID) 50 MCG tablet Take 50-75 mcg by mouth See admin instructions. Take  1 1/2 tablets (75 mcg) by mouth daily on Tuesday and Thursday nights, take 1 tablet (50 mcg) by mouth daily on all other nights of the week     losartan (COZAAR) 100 MG tablet Take 100 mg by mouth daily.     Multiple Vitamins-Minerals (ZINC PO) Take 22 mg by mouth daily.     OVER THE COUNTER MEDICATION Take 4 capsules by mouth 2 (two) times a day. Juice plus - 1 fruit, 1 vegetable, 1 berries, 1 omega - twice daily     predniSONE (DELTASONE) 5 MG tablet Take 5 mg by mouth daily with breakfast.     Probiotic Product (PROBIOTIC DAILY PO) Take 1 tablet by mouth daily.     PSYLLIUM PO Take 3 capsules by mouth daily at 2 PM.      SEVELAMER HCL PO Take 2 tablets by mouth in the  morning and at bedtime.     tiotropium (SPIRIVA) 18 MCG inhalation capsule Place 18 mcg into inhaler and inhale daily.     No current facility-administered medications for this visit.    On ROS today: negative unless stated in HPI   Physical Examination   Vitals:   10/10/22 1501  BP: (!) 153/81  Pulse: 60  Resp: 20  Temp: 98 F (36.7 C)  TempSrc: Temporal  SpO2: 97%  Weight: 107 lb (48.5 kg)  Height: '5\' 5"'$  (1.651 m)   Body mass index is 17.81 kg/m.  General Alert, O x 3,  thin , NAD  Pulmonary Sym exp, non labored,   Cardiac Regular rate and rhythm ,   Vascular Vessel Right Left  Radial Palpable Palpable  Brachial Palpable Palpable  Ulnar Palpable Palpable    Musculo- skeletal M/S 5/5 throughout, Extremities  without ischemic changes  Left AV graft as shown below. Occluded. Small pinpoint opening mid graft with scant fibrinous/purulent exudate. Unable to express any increased fluid. Two small areas of mild erythema. Non tender. Does not appear acutely infected   Neurologic A&O; CN grossly intact   Medical Decision Making   Kimberly [name] is a 67 y.o. female who presents for re evaluation of possible left AV graft infection. Left AV graft has been occluded now for over 1 year. She has had recurrent ulceration with scab and some drainage. She had cultures obtained at her dialysis session today and received antibiotics and her session today. I discussed with patient that we really try to avoid excising grafts due to extent of excision and repair of vein and artery required in removal. However if patient really desires excision we can arrange this. I have alternatively recommended IV antibiotics at dialysis once her cultures are resulted. She would like to wait and see what her cultures show and if there is some improvement with antibiotics. She also would like to follow up and discuss excision with Dr. Donzetta Matters. I have made an appointment for her with Dr. Donzetta Matters in 2 weeks.   Karoline Caldwell, PA-C [facility] Office: [phone]  Clinic MD: Scot Dock

## 2022-10-12 DIAGNOSIS — N2581 Secondary hyperparathyroidism of renal origin: Secondary | ICD-10-CM | POA: Diagnosis not present

## 2022-10-12 DIAGNOSIS — R7881 Bacteremia: Secondary | ICD-10-CM | POA: Diagnosis not present

## 2022-10-12 DIAGNOSIS — Z992 Dependence on renal dialysis: Secondary | ICD-10-CM | POA: Diagnosis not present

## 2022-10-12 DIAGNOSIS — N186 End stage renal disease: Secondary | ICD-10-CM | POA: Diagnosis not present

## 2022-10-12 DIAGNOSIS — E1129 Type 2 diabetes mellitus with other diabetic kidney complication: Secondary | ICD-10-CM | POA: Diagnosis not present

## 2022-10-12 DIAGNOSIS — E039 Hypothyroidism, unspecified: Secondary | ICD-10-CM | POA: Diagnosis not present

## 2022-10-12 DIAGNOSIS — D689 Coagulation defect, unspecified: Secondary | ICD-10-CM | POA: Diagnosis not present

## 2022-10-12 DIAGNOSIS — S41101A Unspecified open wound of right upper arm, initial encounter: Secondary | ICD-10-CM | POA: Diagnosis not present

## 2022-10-12 DIAGNOSIS — D631 Anemia in chronic kidney disease: Secondary | ICD-10-CM | POA: Diagnosis not present

## 2022-10-14 DIAGNOSIS — K080 Exfoliation of teeth due to systemic causes: Secondary | ICD-10-CM | POA: Diagnosis not present

## 2022-10-15 DIAGNOSIS — S41101A Unspecified open wound of right upper arm, initial encounter: Secondary | ICD-10-CM | POA: Diagnosis not present

## 2022-10-15 DIAGNOSIS — D631 Anemia in chronic kidney disease: Secondary | ICD-10-CM | POA: Diagnosis not present

## 2022-10-15 DIAGNOSIS — Z992 Dependence on renal dialysis: Secondary | ICD-10-CM | POA: Diagnosis not present

## 2022-10-15 DIAGNOSIS — R7881 Bacteremia: Secondary | ICD-10-CM | POA: Diagnosis not present

## 2022-10-15 DIAGNOSIS — N2581 Secondary hyperparathyroidism of renal origin: Secondary | ICD-10-CM | POA: Diagnosis not present

## 2022-10-15 DIAGNOSIS — E1129 Type 2 diabetes mellitus with other diabetic kidney complication: Secondary | ICD-10-CM | POA: Diagnosis not present

## 2022-10-15 DIAGNOSIS — N186 End stage renal disease: Secondary | ICD-10-CM | POA: Diagnosis not present

## 2022-10-15 DIAGNOSIS — D689 Coagulation defect, unspecified: Secondary | ICD-10-CM | POA: Diagnosis not present

## 2022-10-15 DIAGNOSIS — E039 Hypothyroidism, unspecified: Secondary | ICD-10-CM | POA: Diagnosis not present

## 2022-10-17 DIAGNOSIS — D631 Anemia in chronic kidney disease: Secondary | ICD-10-CM | POA: Diagnosis not present

## 2022-10-17 DIAGNOSIS — R7881 Bacteremia: Secondary | ICD-10-CM | POA: Diagnosis not present

## 2022-10-17 DIAGNOSIS — Z992 Dependence on renal dialysis: Secondary | ICD-10-CM | POA: Diagnosis not present

## 2022-10-17 DIAGNOSIS — N186 End stage renal disease: Secondary | ICD-10-CM | POA: Diagnosis not present

## 2022-10-17 DIAGNOSIS — S41101A Unspecified open wound of right upper arm, initial encounter: Secondary | ICD-10-CM | POA: Diagnosis not present

## 2022-10-17 DIAGNOSIS — E039 Hypothyroidism, unspecified: Secondary | ICD-10-CM | POA: Diagnosis not present

## 2022-10-17 DIAGNOSIS — D689 Coagulation defect, unspecified: Secondary | ICD-10-CM | POA: Diagnosis not present

## 2022-10-17 DIAGNOSIS — N2581 Secondary hyperparathyroidism of renal origin: Secondary | ICD-10-CM | POA: Diagnosis not present

## 2022-10-17 DIAGNOSIS — E1129 Type 2 diabetes mellitus with other diabetic kidney complication: Secondary | ICD-10-CM | POA: Diagnosis not present

## 2022-10-19 DIAGNOSIS — S41101A Unspecified open wound of right upper arm, initial encounter: Secondary | ICD-10-CM | POA: Diagnosis not present

## 2022-10-19 DIAGNOSIS — N186 End stage renal disease: Secondary | ICD-10-CM | POA: Diagnosis not present

## 2022-10-19 DIAGNOSIS — E1129 Type 2 diabetes mellitus with other diabetic kidney complication: Secondary | ICD-10-CM | POA: Diagnosis not present

## 2022-10-19 DIAGNOSIS — Z992 Dependence on renal dialysis: Secondary | ICD-10-CM | POA: Diagnosis not present

## 2022-10-19 DIAGNOSIS — D631 Anemia in chronic kidney disease: Secondary | ICD-10-CM | POA: Diagnosis not present

## 2022-10-19 DIAGNOSIS — R7881 Bacteremia: Secondary | ICD-10-CM | POA: Diagnosis not present

## 2022-10-19 DIAGNOSIS — D689 Coagulation defect, unspecified: Secondary | ICD-10-CM | POA: Diagnosis not present

## 2022-10-19 DIAGNOSIS — N2581 Secondary hyperparathyroidism of renal origin: Secondary | ICD-10-CM | POA: Diagnosis not present

## 2022-10-19 DIAGNOSIS — E039 Hypothyroidism, unspecified: Secondary | ICD-10-CM | POA: Diagnosis not present

## 2022-10-22 DIAGNOSIS — E1129 Type 2 diabetes mellitus with other diabetic kidney complication: Secondary | ICD-10-CM | POA: Diagnosis not present

## 2022-10-22 DIAGNOSIS — E039 Hypothyroidism, unspecified: Secondary | ICD-10-CM | POA: Diagnosis not present

## 2022-10-22 DIAGNOSIS — R7881 Bacteremia: Secondary | ICD-10-CM | POA: Diagnosis not present

## 2022-10-22 DIAGNOSIS — N186 End stage renal disease: Secondary | ICD-10-CM | POA: Diagnosis not present

## 2022-10-22 DIAGNOSIS — N2581 Secondary hyperparathyroidism of renal origin: Secondary | ICD-10-CM | POA: Diagnosis not present

## 2022-10-22 DIAGNOSIS — D631 Anemia in chronic kidney disease: Secondary | ICD-10-CM | POA: Diagnosis not present

## 2022-10-22 DIAGNOSIS — S41101A Unspecified open wound of right upper arm, initial encounter: Secondary | ICD-10-CM | POA: Diagnosis not present

## 2022-10-22 DIAGNOSIS — D689 Coagulation defect, unspecified: Secondary | ICD-10-CM | POA: Diagnosis not present

## 2022-10-22 DIAGNOSIS — Z992 Dependence on renal dialysis: Secondary | ICD-10-CM | POA: Diagnosis not present

## 2022-10-23 ENCOUNTER — Encounter: Payer: Self-pay | Admitting: Vascular Surgery

## 2022-10-23 ENCOUNTER — Ambulatory Visit: Payer: Medicare Other | Admitting: Vascular Surgery

## 2022-10-23 VITALS — BP 154/78 | HR 63 | Temp 97.9°F | Resp 20 | Ht 65.0 in | Wt 108.0 lb

## 2022-10-23 DIAGNOSIS — Z992 Dependence on renal dialysis: Secondary | ICD-10-CM | POA: Diagnosis not present

## 2022-10-23 DIAGNOSIS — N186 End stage renal disease: Secondary | ICD-10-CM | POA: Diagnosis not present

## 2022-10-23 DIAGNOSIS — T8612 Kidney transplant failure: Secondary | ICD-10-CM | POA: Diagnosis not present

## 2022-10-23 NOTE — Progress Notes (Signed)
Patient ID: Kimberly [name], female   DOB: [birth year], 67 y.o.   MRN: [id number]  Reason for Consult: Follow-up   Referred by [name], MD  Subjective:     HPI:  Kimberly [name] is a 67 y.o. female With history of bilateral upper extremity AV graft.  Most recently she has had a right arm AV graft which is functioning.  She has had a draining sinus from the left arm AV graft this has been cultured it was negative she is also been placed on antibiotics.  She now follows up for discussion of surgical options.  Past Medical History:  Diagnosis Date   Anemia    Arthritis    Asthma    Complication of anesthesia    Slow to awaken and AMS- took 1 month to memory and recall to return   COVID    Diverticulosis    First degree AV block    GERD (gastroesophageal reflux disease)    History of blood transfusion    History of chronic cough    History of hiatal hernia    History of hoarseness    Hypertension    history; resolved on dialysis   Hypothyroidism    IgA nephropathy    ESRD - Hemo TTH Sat   Premature atrial beat    Premature ventricular beat    Renal transplant failure and rejection 2012   Shingles 03/2011   Sleep apnea    uses a mouth guard   Stroke [facility]) October 2010, March 2013   cerebral aneurysm, 07/2017   Family History  Problem Relation Age of Onset   Coronary artery disease Mother    Emphysema Mother        smoked   Ovarian cancer Mother    Liver disease Father    Stroke Maternal Uncle    Diabetes Maternal Uncle    Kidney disease Maternal Uncle    Past Surgical History:  Procedure Laterality Date   AV FISTULA PLACEMENT Left 05/20/2018   Procedure: INSERTION OF ARTERIOVENOUS (AV) GORE-TEX GRAFT LEFT  ARM;  Surgeon: [name], MD;  Location: [place];  Service: Vascular;  Laterality: Left;   AV FISTULA PLACEMENT Right 10/26/2021   Procedure: INSERTION OF RIGHT ARM ARTERIOVENOUS (AV) GORE-TEX GRAFT;  Surgeon: [name], MD;   Location: [place];  Service: Vascular;  Laterality: Right;   CAPD INSERTION  08/25/2008   open, Dr [name]   CAPD INSERTION N/A 12/14/2013   Procedure: LAPAROSCOPIC INSERTION CONTINUOUS AMBULATORY PERITONEAL DIALYSIS  (CAPD) CATHETER;  Surgeon: [name], MD;  Location: [place];  Service: General;  Laterality: N/A;   CAPD REMOVAL  03/22/2010   Dr [name]   COLONOSCOPY     ESOPHAGOGASTRODUODENOSCOPY     EYE SURGERY Bilateral    radial keratotomy   Hemodialysis catheter Right 02/13/2018   INSERTION OF MESH N/A 12/14/2013   Procedure: INSERTION OF MESH;  Surgeon: [name], MD;  Location: [place];  Service: General;  Laterality: N/A;   KIDNEY TRANSPLANT Right 01/2010   [facility]   LAPAROSCOPIC LYSIS OF ADHESIONS N/A 12/14/2013   Procedure: LAPAROSCOPIC LYSIS OF ADHESIONS;  Surgeon: [name], MD;  Location: [place];  Service: General;  Laterality: N/A;   PATCH ANGIOPLASTY  06/29/2021   Procedure: PATCH ANGIOPLASTY;  Surgeon: Cherre Robins, MD;  Location: [place];  Service: Vascular;;   THROMBECTOMY AND REVISION OF ARTERIOVENTOUS (AV) GORETEX  GRAFT Left 06/29/2021   Procedure: REVISION OF LEFT  UPPER EXTREMITY ARTERIOVENOUS GORETEX GRAFT WITH THROMBECTOMY;  Surgeon: Cherre Robins, MD;  Location: [facility] OR;  Service: Vascular;  Laterality: Left;  PERIPHERAL NERVE BLOCK   TOTAL HIP ARTHROPLASTY Left 02/06/2019   Procedure: TOTAL HIP ARTHROPLASTY ANTERIOR APPROACH;  Surgeon: Rod Can, MD;  Location: [place];  Service: Orthopedics;  Laterality: Left;   UMBILICAL HERNIA REPAIR  10/25/2009   open with mesh,  Dr [name]   URETER REVISION  04/2011   [facility]   VENTRAL HERNIA REPAIR N/A 12/14/2013   Procedure: LAPAROSCOPIC VENTRAL HERNIA;  Surgeon: [name], MD;  Location: [place];  Service: General;  Laterality: N/A;   [place] (VATS)/WEDGE RESECTION Left 09/01/2014   Procedure: VIDEO ASSISTED THORACOSCOPY (VATS)/WEDGE RESECTION;  Surgeon: [name], MD;   Location: [place];  Service: Thoracic;  Laterality: Left;   VIDEO BRONCHOSCOPY Bilateral 07/05/2014   Procedure: VIDEO BRONCHOSCOPY WITH FLUORO;  Surgeon: [name], MD;  Location: [facility];  Service: Cardiopulmonary;  Laterality: Bilateral;    Short Social History:  Social History   Tobacco Use   Smoking status: Never    Passive exposure: Never   Smokeless tobacco: Never  Substance Use Topics   Alcohol use: Yes    Comment: ocassional- not weekly    Allergies  Allergen Reactions   Mircera [Methoxy Polyethylene Glycol-Epoetin Beta] Other (See Comments)    Severe joint and muscle pain   Mycophenolate Mofetil Diarrhea   Dapsone Rash and Other (See Comments)    Pain in feet   Pregabalin Rash and Other (See Comments)    Fever - reaction to Lyrica   Sulfonamide Derivatives Rash   Tramadol Rash and Other (See Comments)    Rash on cheek    Current Outpatient Medications  Medication Sig Dispense Refill   albuterol (VENTOLIN HFA) 108 (90 Base) MCG/ACT inhaler Inhale 2 puffs into the lungs every 6 (six) hours as needed for shortness of breath or wheezing (Asthma).     amLODipine (NORVASC) 10 MG tablet Take 10 mg by mouth daily.     budesonide-formoterol (SYMBICORT) 160-4.5 MCG/ACT inhaler Inhale 2 puffs into the lungs 2 (two) times daily.     cinacalcet (SENSIPAR) 30 MG tablet Take 30 mg by mouth daily after supper.     EPOETIN ALFA IJ Inject into the skin See admin instructions. Inject  subcutaneously at dialysis as needed for low hemoglobin     Garlic 7353 MG CAPS Take 1,000 mg by mouth in the morning, at noon, and at bedtime.     hydroxychloroquine (PLAQUENIL) 200 MG tablet Take 200 mg by mouth daily.     levothyroxine (SYNTHROID, LEVOTHROID) 50 MCG tablet Take 50-75 mcg by mouth See admin instructions. Take  1 1/2 tablets (75 mcg) by mouth daily on Tuesday and Thursday nights, take 1 tablet (50 mcg) by mouth daily on all other nights of the week     losartan (COZAAR) 100 MG  tablet Take 100 mg by mouth daily.     Multiple Vitamins-Minerals (ZINC PO) Take 22 mg by mouth daily.     OVER THE COUNTER MEDICATION Take 4 capsules by mouth 2 (two) times a day. Juice plus - 1 fruit, 1 vegetable, 1 berries, 1 omega - twice daily     predniSONE (DELTASONE) 5 MG tablet Take 5 mg by mouth daily with breakfast.     Probiotic Product (PROBIOTIC DAILY PO) Take 1 tablet by mouth daily.     PSYLLIUM PO Take 3 capsules by  mouth daily at 2 PM.      SEVELAMER HCL PO Take 2 tablets by mouth in the morning and at bedtime.     tiotropium (SPIRIVA) 18 MCG inhalation capsule Place 18 mcg into inhaler and inhale daily.     No current facility-administered medications for this visit.    Review of Systems  Constitutional:  Constitutional negative. HENT: HENT negative.  Eyes: Eyes negative.  Respiratory: Respiratory negative.  Cardiovascular: Cardiovascular negative.  GI: Gastrointestinal negative.  Musculoskeletal:       Draining sinus left arm Skin: Skin negative.  Neurological: Neurological negative. Hematologic: Hematologic/lymphatic negative.  Psychiatric: Psychiatric negative.        Objective:  Objective   Vitals:   10/23/22 0948  BP: (!) 154/78  Pulse: 63  Resp: 20  Temp: 97.9 F (36.6 C)  SpO2: 98%  Weight: 108 lb (49 kg)  Height: '5\' 5"'$  (1.651 m)   Body mass index is 17.97 kg/m.  Physical Exam HENT:     Head: Normocephalic.     Nose: Nose normal.  Eyes:     Pupils: Pupils are equal, round, and reactive to light.  Cardiovascular:     Rate and Rhythm: Normal rate.  Abdominal:     General: Abdomen is flat.  Musculoskeletal:        General: Normal range of motion.     Cervical back: Normal range of motion and neck supple.     Comments: Small draining sinus left upper arm communicates with graft  Skin:    Capillary Refill: Capillary refill takes less than 2 seconds.  Neurological:     Mental Status: She is alert.  Psychiatric:        Mood and  Affect: Mood normal.     Data: No studies     Assessment/Plan:    67 year old female with end-stage renal disease on dialysis via right arm AV graft with draining sinus in the left arm AV graft.  We discussed her options being continued antibiotics versus partial excision versus full excision of the graft.  I discussed with her that full excision will require patch angioplasty of the artery and repair of the vein as well and will be accompanied by significant pain.  Patient demonstrates good understanding of this and would like to have the entire thing removed.  I discussed with her that if she changes her mind we can perform partial excision.  This will be performed on a nondialysis day in the near future.     [name] MD [facility]

## 2022-10-24 ENCOUNTER — Telehealth: Payer: Self-pay

## 2022-10-24 DIAGNOSIS — N186 End stage renal disease: Secondary | ICD-10-CM | POA: Diagnosis not present

## 2022-10-24 DIAGNOSIS — Z992 Dependence on renal dialysis: Secondary | ICD-10-CM | POA: Diagnosis not present

## 2022-10-24 DIAGNOSIS — D689 Coagulation defect, unspecified: Secondary | ICD-10-CM | POA: Diagnosis not present

## 2022-10-24 DIAGNOSIS — D631 Anemia in chronic kidney disease: Secondary | ICD-10-CM | POA: Diagnosis not present

## 2022-10-24 DIAGNOSIS — E039 Hypothyroidism, unspecified: Secondary | ICD-10-CM | POA: Diagnosis not present

## 2022-10-24 DIAGNOSIS — R7881 Bacteremia: Secondary | ICD-10-CM | POA: Diagnosis not present

## 2022-10-24 DIAGNOSIS — N2581 Secondary hyperparathyroidism of renal origin: Secondary | ICD-10-CM | POA: Diagnosis not present

## 2022-10-24 NOTE — Telephone Encounter (Signed)
Pt is wanting to get scheduled for procedure next month and needed to confirm with MD that having her left shoulder "drained" by a sport med provider for pseudogout is okay to do this month. MD is aware and felt this is fine to proceed with. Pt is aware.

## 2022-10-25 ENCOUNTER — Telehealth: Payer: Self-pay | Admitting: Pulmonary Disease

## 2022-10-25 DIAGNOSIS — R490 Dysphonia: Secondary | ICD-10-CM | POA: Diagnosis not present

## 2022-10-25 MED ORDER — ALBUTEROL SULFATE HFA 108 (90 BASE) MCG/ACT IN AERS: 2.0000 | INHALATION_SPRAY | Freq: Four times a day (QID) | RESPIRATORY_TRACT | 4 refills | Status: DC | PRN

## 2022-10-25 MED ORDER — BUDESONIDE-FORMOTEROL FUMARATE 160-4.5 MCG/ACT IN AERO: 2.0000 | INHALATION_SPRAY | Freq: Two times a day (BID) | RESPIRATORY_TRACT | 6 refills | Status: DC

## 2022-10-25 MED ORDER — TIOTROPIUM BROMIDE MONOHYDRATE 18 MCG IN CAPS: 18.0000 ug | ORAL_CAPSULE | Freq: Every day | RESPIRATORY_TRACT | 6 refills | Status: DC

## 2022-10-25 NOTE — Telephone Encounter (Signed)
Called and spoke to patient and verified what medications she was needing refilled. She told me Albuterol, Symbicort and Sprivia. She told me the pharmacy on the phone. Refills sent. Nothing further needed

## 2022-10-26 DIAGNOSIS — D631 Anemia in chronic kidney disease: Secondary | ICD-10-CM | POA: Diagnosis not present

## 2022-10-26 DIAGNOSIS — N2581 Secondary hyperparathyroidism of renal origin: Secondary | ICD-10-CM | POA: Diagnosis not present

## 2022-10-26 DIAGNOSIS — E039 Hypothyroidism, unspecified: Secondary | ICD-10-CM | POA: Diagnosis not present

## 2022-10-26 DIAGNOSIS — N186 End stage renal disease: Secondary | ICD-10-CM | POA: Diagnosis not present

## 2022-10-26 DIAGNOSIS — R7881 Bacteremia: Secondary | ICD-10-CM | POA: Diagnosis not present

## 2022-10-26 DIAGNOSIS — D689 Coagulation defect, unspecified: Secondary | ICD-10-CM | POA: Diagnosis not present

## 2022-10-26 DIAGNOSIS — Z992 Dependence on renal dialysis: Secondary | ICD-10-CM | POA: Diagnosis not present

## 2022-10-29 DIAGNOSIS — Z992 Dependence on renal dialysis: Secondary | ICD-10-CM | POA: Diagnosis not present

## 2022-10-29 DIAGNOSIS — N186 End stage renal disease: Secondary | ICD-10-CM | POA: Diagnosis not present

## 2022-10-29 DIAGNOSIS — R7881 Bacteremia: Secondary | ICD-10-CM | POA: Diagnosis not present

## 2022-10-29 DIAGNOSIS — E039 Hypothyroidism, unspecified: Secondary | ICD-10-CM | POA: Diagnosis not present

## 2022-10-29 DIAGNOSIS — D631 Anemia in chronic kidney disease: Secondary | ICD-10-CM | POA: Diagnosis not present

## 2022-10-29 DIAGNOSIS — N2581 Secondary hyperparathyroidism of renal origin: Secondary | ICD-10-CM | POA: Diagnosis not present

## 2022-10-29 DIAGNOSIS — D689 Coagulation defect, unspecified: Secondary | ICD-10-CM | POA: Diagnosis not present

## 2022-10-30 DIAGNOSIS — N186 End stage renal disease: Secondary | ICD-10-CM | POA: Diagnosis not present

## 2022-10-30 DIAGNOSIS — E340 Carcinoid syndrome: Secondary | ICD-10-CM | POA: Diagnosis not present

## 2022-10-30 DIAGNOSIS — M1120 Other chondrocalcinosis, unspecified site: Secondary | ICD-10-CM | POA: Diagnosis not present

## 2022-10-30 DIAGNOSIS — M0579 Rheumatoid arthritis with rheumatoid factor of multiple sites without organ or systems involvement: Secondary | ICD-10-CM | POA: Diagnosis not present

## 2022-11-02 DIAGNOSIS — D689 Coagulation defect, unspecified: Secondary | ICD-10-CM | POA: Diagnosis not present

## 2022-11-02 DIAGNOSIS — E039 Hypothyroidism, unspecified: Secondary | ICD-10-CM | POA: Diagnosis not present

## 2022-11-02 DIAGNOSIS — D631 Anemia in chronic kidney disease: Secondary | ICD-10-CM | POA: Diagnosis not present

## 2022-11-02 DIAGNOSIS — R7881 Bacteremia: Secondary | ICD-10-CM | POA: Diagnosis not present

## 2022-11-02 DIAGNOSIS — N2581 Secondary hyperparathyroidism of renal origin: Secondary | ICD-10-CM | POA: Diagnosis not present

## 2022-11-02 DIAGNOSIS — Z992 Dependence on renal dialysis: Secondary | ICD-10-CM | POA: Diagnosis not present

## 2022-11-02 DIAGNOSIS — N186 End stage renal disease: Secondary | ICD-10-CM | POA: Diagnosis not present

## 2022-11-05 DIAGNOSIS — D689 Coagulation defect, unspecified: Secondary | ICD-10-CM | POA: Diagnosis not present

## 2022-11-05 DIAGNOSIS — R7881 Bacteremia: Secondary | ICD-10-CM | POA: Diagnosis not present

## 2022-11-05 DIAGNOSIS — N186 End stage renal disease: Secondary | ICD-10-CM | POA: Diagnosis not present

## 2022-11-05 DIAGNOSIS — D631 Anemia in chronic kidney disease: Secondary | ICD-10-CM | POA: Diagnosis not present

## 2022-11-05 DIAGNOSIS — Z992 Dependence on renal dialysis: Secondary | ICD-10-CM | POA: Diagnosis not present

## 2022-11-05 DIAGNOSIS — N2581 Secondary hyperparathyroidism of renal origin: Secondary | ICD-10-CM | POA: Diagnosis not present

## 2022-11-05 DIAGNOSIS — E039 Hypothyroidism, unspecified: Secondary | ICD-10-CM | POA: Diagnosis not present

## 2022-11-06 ENCOUNTER — Other Ambulatory Visit (HOSPITAL_COMMUNITY): Payer: Self-pay

## 2022-11-07 DIAGNOSIS — N186 End stage renal disease: Secondary | ICD-10-CM | POA: Diagnosis not present

## 2022-11-07 DIAGNOSIS — E039 Hypothyroidism, unspecified: Secondary | ICD-10-CM | POA: Diagnosis not present

## 2022-11-07 DIAGNOSIS — Z992 Dependence on renal dialysis: Secondary | ICD-10-CM | POA: Diagnosis not present

## 2022-11-07 DIAGNOSIS — D631 Anemia in chronic kidney disease: Secondary | ICD-10-CM | POA: Diagnosis not present

## 2022-11-07 DIAGNOSIS — N2581 Secondary hyperparathyroidism of renal origin: Secondary | ICD-10-CM | POA: Diagnosis not present

## 2022-11-07 DIAGNOSIS — D689 Coagulation defect, unspecified: Secondary | ICD-10-CM | POA: Diagnosis not present

## 2022-11-07 DIAGNOSIS — R7881 Bacteremia: Secondary | ICD-10-CM | POA: Diagnosis not present

## 2022-11-08 DIAGNOSIS — Z79899 Other long term (current) drug therapy: Secondary | ICD-10-CM | POA: Diagnosis not present

## 2022-11-09 DIAGNOSIS — D631 Anemia in chronic kidney disease: Secondary | ICD-10-CM | POA: Diagnosis not present

## 2022-11-09 DIAGNOSIS — Z992 Dependence on renal dialysis: Secondary | ICD-10-CM | POA: Diagnosis not present

## 2022-11-09 DIAGNOSIS — D689 Coagulation defect, unspecified: Secondary | ICD-10-CM | POA: Diagnosis not present

## 2022-11-09 DIAGNOSIS — N186 End stage renal disease: Secondary | ICD-10-CM | POA: Diagnosis not present

## 2022-11-09 DIAGNOSIS — E039 Hypothyroidism, unspecified: Secondary | ICD-10-CM | POA: Diagnosis not present

## 2022-11-09 DIAGNOSIS — R7881 Bacteremia: Secondary | ICD-10-CM | POA: Diagnosis not present

## 2022-11-09 DIAGNOSIS — N2581 Secondary hyperparathyroidism of renal origin: Secondary | ICD-10-CM | POA: Diagnosis not present

## 2022-11-12 DIAGNOSIS — R7881 Bacteremia: Secondary | ICD-10-CM | POA: Diagnosis not present

## 2022-11-12 DIAGNOSIS — D631 Anemia in chronic kidney disease: Secondary | ICD-10-CM | POA: Diagnosis not present

## 2022-11-12 DIAGNOSIS — N186 End stage renal disease: Secondary | ICD-10-CM | POA: Diagnosis not present

## 2022-11-12 DIAGNOSIS — N2581 Secondary hyperparathyroidism of renal origin: Secondary | ICD-10-CM | POA: Diagnosis not present

## 2022-11-12 DIAGNOSIS — Z992 Dependence on renal dialysis: Secondary | ICD-10-CM | POA: Diagnosis not present

## 2022-11-12 DIAGNOSIS — D689 Coagulation defect, unspecified: Secondary | ICD-10-CM | POA: Diagnosis not present

## 2022-11-12 DIAGNOSIS — M25512 Pain in left shoulder: Secondary | ICD-10-CM | POA: Diagnosis not present

## 2022-11-12 DIAGNOSIS — E039 Hypothyroidism, unspecified: Secondary | ICD-10-CM | POA: Diagnosis not present

## 2022-11-13 DIAGNOSIS — I700 Atherosclerosis of aorta: Secondary | ICD-10-CM | POA: Diagnosis not present

## 2022-11-13 DIAGNOSIS — Z0000 Encounter for general adult medical examination without abnormal findings: Secondary | ICD-10-CM | POA: Diagnosis not present

## 2022-11-13 DIAGNOSIS — J449 Chronic obstructive pulmonary disease, unspecified: Secondary | ICD-10-CM | POA: Diagnosis not present

## 2022-11-13 DIAGNOSIS — Z23 Encounter for immunization: Secondary | ICD-10-CM | POA: Diagnosis not present

## 2022-11-13 DIAGNOSIS — M069 Rheumatoid arthritis, unspecified: Secondary | ICD-10-CM | POA: Diagnosis not present

## 2022-11-13 DIAGNOSIS — Z992 Dependence on renal dialysis: Secondary | ICD-10-CM | POA: Diagnosis not present

## 2022-11-13 DIAGNOSIS — Z1331 Encounter for screening for depression: Secondary | ICD-10-CM | POA: Diagnosis not present

## 2022-11-14 DIAGNOSIS — N2581 Secondary hyperparathyroidism of renal origin: Secondary | ICD-10-CM | POA: Diagnosis not present

## 2022-11-14 DIAGNOSIS — E039 Hypothyroidism, unspecified: Secondary | ICD-10-CM | POA: Diagnosis not present

## 2022-11-14 DIAGNOSIS — N186 End stage renal disease: Secondary | ICD-10-CM | POA: Diagnosis not present

## 2022-11-14 DIAGNOSIS — R7881 Bacteremia: Secondary | ICD-10-CM | POA: Diagnosis not present

## 2022-11-14 DIAGNOSIS — Z992 Dependence on renal dialysis: Secondary | ICD-10-CM | POA: Diagnosis not present

## 2022-11-14 DIAGNOSIS — D689 Coagulation defect, unspecified: Secondary | ICD-10-CM | POA: Diagnosis not present

## 2022-11-14 DIAGNOSIS — D631 Anemia in chronic kidney disease: Secondary | ICD-10-CM | POA: Diagnosis not present

## 2022-11-16 DIAGNOSIS — R7881 Bacteremia: Secondary | ICD-10-CM | POA: Diagnosis not present

## 2022-11-16 DIAGNOSIS — N2581 Secondary hyperparathyroidism of renal origin: Secondary | ICD-10-CM | POA: Diagnosis not present

## 2022-11-16 DIAGNOSIS — D631 Anemia in chronic kidney disease: Secondary | ICD-10-CM | POA: Diagnosis not present

## 2022-11-16 DIAGNOSIS — E039 Hypothyroidism, unspecified: Secondary | ICD-10-CM | POA: Diagnosis not present

## 2022-11-16 DIAGNOSIS — Z992 Dependence on renal dialysis: Secondary | ICD-10-CM | POA: Diagnosis not present

## 2022-11-16 DIAGNOSIS — N186 End stage renal disease: Secondary | ICD-10-CM | POA: Diagnosis not present

## 2022-11-16 DIAGNOSIS — D689 Coagulation defect, unspecified: Secondary | ICD-10-CM | POA: Diagnosis not present

## 2022-11-18 DIAGNOSIS — R053 Chronic cough: Secondary | ICD-10-CM | POA: Diagnosis not present

## 2022-11-18 DIAGNOSIS — N2581 Secondary hyperparathyroidism of renal origin: Secondary | ICD-10-CM | POA: Diagnosis not present

## 2022-11-18 DIAGNOSIS — R7881 Bacteremia: Secondary | ICD-10-CM | POA: Diagnosis not present

## 2022-11-18 DIAGNOSIS — D631 Anemia in chronic kidney disease: Secondary | ICD-10-CM | POA: Diagnosis not present

## 2022-11-18 DIAGNOSIS — D689 Coagulation defect, unspecified: Secondary | ICD-10-CM | POA: Diagnosis not present

## 2022-11-18 DIAGNOSIS — E039 Hypothyroidism, unspecified: Secondary | ICD-10-CM | POA: Diagnosis not present

## 2022-11-18 DIAGNOSIS — Z992 Dependence on renal dialysis: Secondary | ICD-10-CM | POA: Diagnosis not present

## 2022-11-18 DIAGNOSIS — N186 End stage renal disease: Secondary | ICD-10-CM | POA: Diagnosis not present

## 2022-11-18 DIAGNOSIS — R059 Cough, unspecified: Secondary | ICD-10-CM | POA: Diagnosis not present

## 2022-11-19 DIAGNOSIS — D689 Coagulation defect, unspecified: Secondary | ICD-10-CM | POA: Diagnosis not present

## 2022-11-19 DIAGNOSIS — Z992 Dependence on renal dialysis: Secondary | ICD-10-CM | POA: Diagnosis not present

## 2022-11-19 DIAGNOSIS — N2581 Secondary hyperparathyroidism of renal origin: Secondary | ICD-10-CM | POA: Diagnosis not present

## 2022-11-19 DIAGNOSIS — D631 Anemia in chronic kidney disease: Secondary | ICD-10-CM | POA: Diagnosis not present

## 2022-11-19 DIAGNOSIS — N186 End stage renal disease: Secondary | ICD-10-CM | POA: Diagnosis not present

## 2022-11-19 DIAGNOSIS — E039 Hypothyroidism, unspecified: Secondary | ICD-10-CM | POA: Diagnosis not present

## 2022-11-19 DIAGNOSIS — R7881 Bacteremia: Secondary | ICD-10-CM | POA: Diagnosis not present

## 2022-11-21 DIAGNOSIS — E039 Hypothyroidism, unspecified: Secondary | ICD-10-CM | POA: Diagnosis not present

## 2022-11-21 DIAGNOSIS — Z992 Dependence on renal dialysis: Secondary | ICD-10-CM | POA: Diagnosis not present

## 2022-11-21 DIAGNOSIS — N2581 Secondary hyperparathyroidism of renal origin: Secondary | ICD-10-CM | POA: Diagnosis not present

## 2022-11-21 DIAGNOSIS — N186 End stage renal disease: Secondary | ICD-10-CM | POA: Diagnosis not present

## 2022-11-21 DIAGNOSIS — T8612 Kidney transplant failure: Secondary | ICD-10-CM | POA: Diagnosis not present

## 2022-11-21 DIAGNOSIS — D631 Anemia in chronic kidney disease: Secondary | ICD-10-CM | POA: Diagnosis not present

## 2022-11-21 DIAGNOSIS — R7881 Bacteremia: Secondary | ICD-10-CM | POA: Diagnosis not present

## 2022-11-21 DIAGNOSIS — D689 Coagulation defect, unspecified: Secondary | ICD-10-CM | POA: Diagnosis not present

## 2022-11-23 DIAGNOSIS — Z992 Dependence on renal dialysis: Secondary | ICD-10-CM | POA: Diagnosis not present

## 2022-11-23 DIAGNOSIS — D689 Coagulation defect, unspecified: Secondary | ICD-10-CM | POA: Diagnosis not present

## 2022-11-23 DIAGNOSIS — N186 End stage renal disease: Secondary | ICD-10-CM | POA: Diagnosis not present

## 2022-11-23 DIAGNOSIS — D631 Anemia in chronic kidney disease: Secondary | ICD-10-CM | POA: Diagnosis not present

## 2022-11-23 DIAGNOSIS — N2581 Secondary hyperparathyroidism of renal origin: Secondary | ICD-10-CM | POA: Diagnosis not present

## 2022-11-23 DIAGNOSIS — E039 Hypothyroidism, unspecified: Secondary | ICD-10-CM | POA: Diagnosis not present

## 2022-11-25 ENCOUNTER — Ambulatory Visit
Admission: RE | Admit: 2022-11-25 | Discharge: 2022-11-25 | Disposition: A | Discharge status: Home / Self Care | Payer: Medicare Other | Source: Ambulatory Visit | Attending: Sports Medicine | Admitting: Sports Medicine

## 2022-11-25 ENCOUNTER — Other Ambulatory Visit: Payer: Self-pay | Admitting: Sports Medicine

## 2022-11-25 DIAGNOSIS — M25512 Pain in left shoulder: Secondary | ICD-10-CM

## 2022-11-26 DIAGNOSIS — D689 Coagulation defect, unspecified: Secondary | ICD-10-CM | POA: Diagnosis not present

## 2022-11-26 DIAGNOSIS — N186 End stage renal disease: Secondary | ICD-10-CM | POA: Diagnosis not present

## 2022-11-26 DIAGNOSIS — Z992 Dependence on renal dialysis: Secondary | ICD-10-CM | POA: Diagnosis not present

## 2022-11-26 DIAGNOSIS — N2581 Secondary hyperparathyroidism of renal origin: Secondary | ICD-10-CM | POA: Diagnosis not present

## 2022-11-26 DIAGNOSIS — E039 Hypothyroidism, unspecified: Secondary | ICD-10-CM | POA: Diagnosis not present

## 2022-11-26 DIAGNOSIS — D631 Anemia in chronic kidney disease: Secondary | ICD-10-CM | POA: Diagnosis not present

## 2022-11-27 ENCOUNTER — Ambulatory Visit: Payer: Medicare Other | Admitting: Pulmonary Disease

## 2022-11-30 DIAGNOSIS — D689 Coagulation defect, unspecified: Secondary | ICD-10-CM | POA: Diagnosis not present

## 2022-11-30 DIAGNOSIS — N186 End stage renal disease: Secondary | ICD-10-CM | POA: Diagnosis not present

## 2022-11-30 DIAGNOSIS — D631 Anemia in chronic kidney disease: Secondary | ICD-10-CM | POA: Diagnosis not present

## 2022-11-30 DIAGNOSIS — N2581 Secondary hyperparathyroidism of renal origin: Secondary | ICD-10-CM | POA: Diagnosis not present

## 2022-11-30 DIAGNOSIS — Z992 Dependence on renal dialysis: Secondary | ICD-10-CM | POA: Diagnosis not present

## 2022-11-30 DIAGNOSIS — E039 Hypothyroidism, unspecified: Secondary | ICD-10-CM | POA: Diagnosis not present

## 2022-12-03 ENCOUNTER — Telehealth: Payer: Self-pay

## 2022-12-03 DIAGNOSIS — Z992 Dependence on renal dialysis: Secondary | ICD-10-CM | POA: Diagnosis not present

## 2022-12-03 DIAGNOSIS — N2581 Secondary hyperparathyroidism of renal origin: Secondary | ICD-10-CM | POA: Diagnosis not present

## 2022-12-03 DIAGNOSIS — D689 Coagulation defect, unspecified: Secondary | ICD-10-CM | POA: Diagnosis not present

## 2022-12-03 DIAGNOSIS — D631 Anemia in chronic kidney disease: Secondary | ICD-10-CM | POA: Diagnosis not present

## 2022-12-03 DIAGNOSIS — N186 End stage renal disease: Secondary | ICD-10-CM | POA: Diagnosis not present

## 2022-12-03 DIAGNOSIS — E039 Hypothyroidism, unspecified: Secondary | ICD-10-CM | POA: Diagnosis not present

## 2022-12-03 NOTE — Telephone Encounter (Signed)
Called pt to schedule surgery. Pt does not want to proceed with scheduling at this time. MD will be made aware.

## 2022-12-04 ENCOUNTER — Ambulatory Visit (INDEPENDENT_AMBULATORY_CARE_PROVIDER_SITE_OTHER): Payer: Medicare Other | Admitting: Vascular Surgery

## 2022-12-04 DIAGNOSIS — T827XXD Infection and inflammatory reaction due to other cardiac and vascular devices, implants and grafts, subsequent encounter: Secondary | ICD-10-CM

## 2022-12-04 DIAGNOSIS — E039 Hypothyroidism, unspecified: Secondary | ICD-10-CM | POA: Diagnosis not present

## 2022-12-04 DIAGNOSIS — N186 End stage renal disease: Secondary | ICD-10-CM | POA: Diagnosis not present

## 2022-12-04 DIAGNOSIS — N185 Chronic kidney disease, stage 5: Secondary | ICD-10-CM | POA: Diagnosis not present

## 2022-12-04 DIAGNOSIS — I10 Essential (primary) hypertension: Secondary | ICD-10-CM | POA: Diagnosis not present

## 2022-12-04 DIAGNOSIS — J449 Chronic obstructive pulmonary disease, unspecified: Secondary | ICD-10-CM | POA: Diagnosis not present

## 2022-12-04 NOTE — Progress Notes (Signed)
Virtual Visit via Telephone Note    I connected with Kimberly [name] on 12/04/2022 using the [email or web address] by telephone and verified that I was speaking with the correct person. Patient was located at home and I am in my office.   The limitations of evaluation and management by telemedicine and the availability of in person appointments have been previously discussed with the patient and are documented in the patients chart. The patient expressed understanding and consented to proceed.  PCP: [name], MD  Chief Complaint: Draining area left graft in the upper arm  History of Present Illness: Kimberly [name] is a 67 y.o. female with history of end-stage renal disease currently dialyzing via right arm AV graft.  She has a history of a left arm AV graft which after thrombectomy began draining sinus she has been treated with antibiotics in the past.  We have recently discussed total graft excision with the possibilities of continued antibiotics and partial graft excision.  We are now having a follow-up visit to have further discussion regarding if and when to proceed.  Past Medical History:  Diagnosis Date   Anemia    Arthritis    Asthma    Complication of anesthesia    Slow to awaken and AMS- took 1 month to memory and recall to return   COVID    Diverticulosis    First degree AV block    GERD (gastroesophageal reflux disease)    History of blood transfusion    History of chronic cough    History of hiatal hernia    History of hoarseness    Hypertension    history; resolved on dialysis   Hypothyroidism    IgA nephropathy    ESRD - Hemo TTH Sat   Premature atrial beat    Premature ventricular beat    Renal transplant failure and rejection 2012   Shingles 03/2011   Sleep apnea    uses a mouth guard   Stroke [facility]) October 2010, March 2013   cerebral aneurysm, 07/2017    Past Surgical History:  Procedure Laterality Date   AV FISTULA PLACEMENT Left 05/20/2018    Procedure: INSERTION OF ARTERIOVENOUS (AV) GORE-TEX GRAFT LEFT  ARM;  Surgeon: [name], MD;  Location: [place];  Service: Vascular;  Laterality: Left;   AV FISTULA PLACEMENT Right 10/26/2021   Procedure: INSERTION OF RIGHT ARM ARTERIOVENOUS (AV) GORE-TEX GRAFT;  Surgeon: [name], MD;  Location: [place];  Service: Vascular;  Laterality: Right;   CAPD INSERTION  08/25/2008   open, Dr [name]   CAPD INSERTION N/A 12/14/2013   Procedure: LAPAROSCOPIC INSERTION CONTINUOUS AMBULATORY PERITONEAL DIALYSIS  (CAPD) CATHETER;  Surgeon: [name], MD;  Location: [employer];  Service: General;  Laterality: N/A;   CAPD REMOVAL  03/22/2010   Dr [name]   COLONOSCOPY     ESOPHAGOGASTRODUODENOSCOPY     EYE SURGERY Bilateral    radial keratotomy   Hemodialysis catheter Right 02/13/2018   INSERTION OF MESH N/A 12/14/2013   Procedure: INSERTION OF MESH;  Surgeon: [name], MD;  Location: [place];  Service: General;  Laterality: N/A;   KIDNEY TRANSPLANT Right 01/2010   [facility]   LAPAROSCOPIC LYSIS OF ADHESIONS N/A 12/14/2013   Procedure: LAPAROSCOPIC LYSIS OF ADHESIONS;  Surgeon: [name], MD;  Location: [place] OR;  Service: General;  Laterality: N/A;   PATCH ANGIOPLASTY  06/29/2021   Procedure: PATCH ANGIOPLASTY;  Surgeon: Cherre Robins,  MD;  Location: [facility];  Service: Vascular;;   THROMBECTOMY AND REVISION OF ARTERIOVENTOUS (AV) GORETEX  GRAFT Left 06/29/2021   Procedure: REVISION OF LEFT UPPER EXTREMITY ARTERIOVENOUS GORETEX GRAFT WITH THROMBECTOMY;  Surgeon: Cherre Robins, MD;  Location: [place];  Service: Vascular;  Laterality: Left;  PERIPHERAL NERVE BLOCK   TOTAL HIP ARTHROPLASTY Left 02/06/2019   Procedure: TOTAL HIP ARTHROPLASTY ANTERIOR APPROACH;  Surgeon: Rod Can, MD;  Location: [place];  Service: Orthopedics;  Laterality: Left;   UMBILICAL HERNIA REPAIR  10/25/2009   open with mesh,  Dr [name]   URETER REVISION  04/2011   [facility]   VENTRAL HERNIA REPAIR N/A  12/14/2013   Procedure: LAPAROSCOPIC VENTRAL HERNIA;  Surgeon: [name], MD;  Location: [place];  Service: General;  Laterality: N/A;   [place] (VATS)/WEDGE RESECTION Left 09/01/2014   Procedure: VIDEO ASSISTED THORACOSCOPY (VATS)/WEDGE RESECTION;  Surgeon: [name], MD;  Location: [place];  Service: Thoracic;  Laterality: Left;   VIDEO BRONCHOSCOPY Bilateral 07/05/2014   Procedure: VIDEO BRONCHOSCOPY WITH FLUORO;  Surgeon: [name], MD;  Location: [facility];  Service: Cardiopulmonary;  Laterality: Bilateral;    No outpatient medications have been marked as taking for the 12/04/22 encounter (Appointment) with [name], MD.    12 system ROS was negative unless otherwise noted in HPI   Observations/Objective: Patient demonstrates good understanding of our discussion today.  Assessment and Plan: 67 year old female with draining sinus from left arm AV graft which by definition is infected although she is not having any systemic symptoms.  We again discussed excising the entirety of the graft which would be the most definitive procedure but would require at least 3 incisions and likely would excise the sinus tract at that time as well.  She is understandably upset that the previous thrombectomy site did not heal but I did discuss with her that the risk of surgery with the graft particularly in the small upper arm is the development of a sinus tract and the ability to get that to heal without excision is going to be negligible.  She does demonstrate good understanding of this as well as the risk of infecting her contralateral graft if we do not proceed.  She will call to schedule left arm graft excision in the future if she desires to proceed.  I spent a total of 12 minutes with the patient via telephone encounter and in reviewing her chart prior the phone visit.   Signed, Servando Snare [facility] [place] Office:  [phone]  12/04/2022, 10:22 AM

## 2022-12-05 DIAGNOSIS — D631 Anemia in chronic kidney disease: Secondary | ICD-10-CM | POA: Diagnosis not present

## 2022-12-05 DIAGNOSIS — N186 End stage renal disease: Secondary | ICD-10-CM | POA: Diagnosis not present

## 2022-12-05 DIAGNOSIS — E039 Hypothyroidism, unspecified: Secondary | ICD-10-CM | POA: Diagnosis not present

## 2022-12-05 DIAGNOSIS — Z992 Dependence on renal dialysis: Secondary | ICD-10-CM | POA: Diagnosis not present

## 2022-12-05 DIAGNOSIS — N2581 Secondary hyperparathyroidism of renal origin: Secondary | ICD-10-CM | POA: Diagnosis not present

## 2022-12-05 DIAGNOSIS — D689 Coagulation defect, unspecified: Secondary | ICD-10-CM | POA: Diagnosis not present

## 2022-12-06 DIAGNOSIS — R041 Hemorrhage from throat: Secondary | ICD-10-CM | POA: Diagnosis not present

## 2022-12-06 DIAGNOSIS — R058 Other specified cough: Secondary | ICD-10-CM | POA: Diagnosis not present

## 2022-12-06 DIAGNOSIS — R490 Dysphonia: Secondary | ICD-10-CM | POA: Diagnosis not present

## 2022-12-07 DIAGNOSIS — N186 End stage renal disease: Secondary | ICD-10-CM | POA: Diagnosis not present

## 2022-12-07 DIAGNOSIS — D631 Anemia in chronic kidney disease: Secondary | ICD-10-CM | POA: Diagnosis not present

## 2022-12-07 DIAGNOSIS — D689 Coagulation defect, unspecified: Secondary | ICD-10-CM | POA: Diagnosis not present

## 2022-12-07 DIAGNOSIS — N2581 Secondary hyperparathyroidism of renal origin: Secondary | ICD-10-CM | POA: Diagnosis not present

## 2022-12-07 DIAGNOSIS — E039 Hypothyroidism, unspecified: Secondary | ICD-10-CM | POA: Diagnosis not present

## 2022-12-07 DIAGNOSIS — Z992 Dependence on renal dialysis: Secondary | ICD-10-CM | POA: Diagnosis not present

## 2022-12-10 DIAGNOSIS — N186 End stage renal disease: Secondary | ICD-10-CM | POA: Diagnosis not present

## 2022-12-10 DIAGNOSIS — Z992 Dependence on renal dialysis: Secondary | ICD-10-CM | POA: Diagnosis not present

## 2022-12-10 DIAGNOSIS — D689 Coagulation defect, unspecified: Secondary | ICD-10-CM | POA: Diagnosis not present

## 2022-12-10 DIAGNOSIS — E039 Hypothyroidism, unspecified: Secondary | ICD-10-CM | POA: Diagnosis not present

## 2022-12-10 DIAGNOSIS — N2581 Secondary hyperparathyroidism of renal origin: Secondary | ICD-10-CM | POA: Diagnosis not present

## 2022-12-10 DIAGNOSIS — D631 Anemia in chronic kidney disease: Secondary | ICD-10-CM | POA: Diagnosis not present

## 2022-12-12 DIAGNOSIS — Z992 Dependence on renal dialysis: Secondary | ICD-10-CM | POA: Diagnosis not present

## 2022-12-12 DIAGNOSIS — N2581 Secondary hyperparathyroidism of renal origin: Secondary | ICD-10-CM | POA: Diagnosis not present

## 2022-12-12 DIAGNOSIS — N186 End stage renal disease: Secondary | ICD-10-CM | POA: Diagnosis not present

## 2022-12-12 DIAGNOSIS — D631 Anemia in chronic kidney disease: Secondary | ICD-10-CM | POA: Diagnosis not present

## 2022-12-12 DIAGNOSIS — D689 Coagulation defect, unspecified: Secondary | ICD-10-CM | POA: Diagnosis not present

## 2022-12-12 DIAGNOSIS — E039 Hypothyroidism, unspecified: Secondary | ICD-10-CM | POA: Diagnosis not present

## 2022-12-14 DIAGNOSIS — D631 Anemia in chronic kidney disease: Secondary | ICD-10-CM | POA: Diagnosis not present

## 2022-12-14 DIAGNOSIS — Z992 Dependence on renal dialysis: Secondary | ICD-10-CM | POA: Diagnosis not present

## 2022-12-14 DIAGNOSIS — N2581 Secondary hyperparathyroidism of renal origin: Secondary | ICD-10-CM | POA: Diagnosis not present

## 2022-12-14 DIAGNOSIS — N186 End stage renal disease: Secondary | ICD-10-CM | POA: Diagnosis not present

## 2022-12-14 DIAGNOSIS — D689 Coagulation defect, unspecified: Secondary | ICD-10-CM | POA: Diagnosis not present

## 2022-12-14 DIAGNOSIS — E039 Hypothyroidism, unspecified: Secondary | ICD-10-CM | POA: Diagnosis not present

## 2022-12-17 DIAGNOSIS — Z992 Dependence on renal dialysis: Secondary | ICD-10-CM | POA: Diagnosis not present

## 2022-12-17 DIAGNOSIS — D631 Anemia in chronic kidney disease: Secondary | ICD-10-CM | POA: Diagnosis not present

## 2022-12-17 DIAGNOSIS — N186 End stage renal disease: Secondary | ICD-10-CM | POA: Diagnosis not present

## 2022-12-17 DIAGNOSIS — N2581 Secondary hyperparathyroidism of renal origin: Secondary | ICD-10-CM | POA: Diagnosis not present

## 2022-12-17 DIAGNOSIS — E039 Hypothyroidism, unspecified: Secondary | ICD-10-CM | POA: Diagnosis not present

## 2022-12-17 DIAGNOSIS — D689 Coagulation defect, unspecified: Secondary | ICD-10-CM | POA: Diagnosis not present

## 2022-12-19 DIAGNOSIS — N186 End stage renal disease: Secondary | ICD-10-CM | POA: Diagnosis not present

## 2022-12-19 DIAGNOSIS — Z992 Dependence on renal dialysis: Secondary | ICD-10-CM | POA: Diagnosis not present

## 2022-12-19 DIAGNOSIS — E039 Hypothyroidism, unspecified: Secondary | ICD-10-CM | POA: Diagnosis not present

## 2022-12-19 DIAGNOSIS — D631 Anemia in chronic kidney disease: Secondary | ICD-10-CM | POA: Diagnosis not present

## 2022-12-19 DIAGNOSIS — N2581 Secondary hyperparathyroidism of renal origin: Secondary | ICD-10-CM | POA: Diagnosis not present

## 2022-12-19 DIAGNOSIS — D689 Coagulation defect, unspecified: Secondary | ICD-10-CM | POA: Diagnosis not present

## 2022-12-21 DIAGNOSIS — D631 Anemia in chronic kidney disease: Secondary | ICD-10-CM | POA: Diagnosis not present

## 2022-12-21 DIAGNOSIS — Z992 Dependence on renal dialysis: Secondary | ICD-10-CM | POA: Diagnosis not present

## 2022-12-21 DIAGNOSIS — N2581 Secondary hyperparathyroidism of renal origin: Secondary | ICD-10-CM | POA: Diagnosis not present

## 2022-12-21 DIAGNOSIS — D689 Coagulation defect, unspecified: Secondary | ICD-10-CM | POA: Diagnosis not present

## 2022-12-21 DIAGNOSIS — N186 End stage renal disease: Secondary | ICD-10-CM | POA: Diagnosis not present

## 2022-12-21 DIAGNOSIS — E039 Hypothyroidism, unspecified: Secondary | ICD-10-CM | POA: Diagnosis not present

## 2022-12-22 DIAGNOSIS — N186 End stage renal disease: Secondary | ICD-10-CM | POA: Diagnosis not present

## 2022-12-22 DIAGNOSIS — T8612 Kidney transplant failure: Secondary | ICD-10-CM | POA: Diagnosis not present

## 2022-12-22 DIAGNOSIS — Z992 Dependence on renal dialysis: Secondary | ICD-10-CM | POA: Diagnosis not present

## 2022-12-24 DIAGNOSIS — N186 End stage renal disease: Secondary | ICD-10-CM | POA: Diagnosis not present

## 2022-12-24 DIAGNOSIS — D689 Coagulation defect, unspecified: Secondary | ICD-10-CM | POA: Diagnosis not present

## 2022-12-24 DIAGNOSIS — E039 Hypothyroidism, unspecified: Secondary | ICD-10-CM | POA: Diagnosis not present

## 2022-12-24 DIAGNOSIS — Z992 Dependence on renal dialysis: Secondary | ICD-10-CM | POA: Diagnosis not present

## 2022-12-24 DIAGNOSIS — E1129 Type 2 diabetes mellitus with other diabetic kidney complication: Secondary | ICD-10-CM | POA: Diagnosis not present

## 2022-12-24 DIAGNOSIS — D631 Anemia in chronic kidney disease: Secondary | ICD-10-CM | POA: Diagnosis not present

## 2022-12-24 DIAGNOSIS — N2581 Secondary hyperparathyroidism of renal origin: Secondary | ICD-10-CM | POA: Diagnosis not present

## 2022-12-24 DIAGNOSIS — R519 Headache, unspecified: Secondary | ICD-10-CM | POA: Diagnosis not present

## 2022-12-28 DIAGNOSIS — R519 Headache, unspecified: Secondary | ICD-10-CM | POA: Diagnosis not present

## 2022-12-28 DIAGNOSIS — N2581 Secondary hyperparathyroidism of renal origin: Secondary | ICD-10-CM | POA: Diagnosis not present

## 2022-12-28 DIAGNOSIS — D631 Anemia in chronic kidney disease: Secondary | ICD-10-CM | POA: Diagnosis not present

## 2022-12-28 DIAGNOSIS — E1129 Type 2 diabetes mellitus with other diabetic kidney complication: Secondary | ICD-10-CM | POA: Diagnosis not present

## 2022-12-28 DIAGNOSIS — D689 Coagulation defect, unspecified: Secondary | ICD-10-CM | POA: Diagnosis not present

## 2022-12-28 DIAGNOSIS — N186 End stage renal disease: Secondary | ICD-10-CM | POA: Diagnosis not present

## 2022-12-28 DIAGNOSIS — E039 Hypothyroidism, unspecified: Secondary | ICD-10-CM | POA: Diagnosis not present

## 2022-12-28 DIAGNOSIS — Z992 Dependence on renal dialysis: Secondary | ICD-10-CM | POA: Diagnosis not present

## 2022-12-31 DIAGNOSIS — N2581 Secondary hyperparathyroidism of renal origin: Secondary | ICD-10-CM | POA: Diagnosis not present

## 2022-12-31 DIAGNOSIS — E039 Hypothyroidism, unspecified: Secondary | ICD-10-CM | POA: Diagnosis not present

## 2022-12-31 DIAGNOSIS — Z992 Dependence on renal dialysis: Secondary | ICD-10-CM | POA: Diagnosis not present

## 2022-12-31 DIAGNOSIS — D631 Anemia in chronic kidney disease: Secondary | ICD-10-CM | POA: Diagnosis not present

## 2022-12-31 DIAGNOSIS — E1129 Type 2 diabetes mellitus with other diabetic kidney complication: Secondary | ICD-10-CM | POA: Diagnosis not present

## 2022-12-31 DIAGNOSIS — D689 Coagulation defect, unspecified: Secondary | ICD-10-CM | POA: Diagnosis not present

## 2022-12-31 DIAGNOSIS — R519 Headache, unspecified: Secondary | ICD-10-CM | POA: Diagnosis not present

## 2022-12-31 DIAGNOSIS — N186 End stage renal disease: Secondary | ICD-10-CM | POA: Diagnosis not present

## 2023-01-02 DIAGNOSIS — N186 End stage renal disease: Secondary | ICD-10-CM | POA: Diagnosis not present

## 2023-01-02 DIAGNOSIS — Z992 Dependence on renal dialysis: Secondary | ICD-10-CM | POA: Diagnosis not present

## 2023-01-02 DIAGNOSIS — D631 Anemia in chronic kidney disease: Secondary | ICD-10-CM | POA: Diagnosis not present

## 2023-01-02 DIAGNOSIS — E1129 Type 2 diabetes mellitus with other diabetic kidney complication: Secondary | ICD-10-CM | POA: Diagnosis not present

## 2023-01-02 DIAGNOSIS — E039 Hypothyroidism, unspecified: Secondary | ICD-10-CM | POA: Diagnosis not present

## 2023-01-02 DIAGNOSIS — R519 Headache, unspecified: Secondary | ICD-10-CM | POA: Diagnosis not present

## 2023-01-02 DIAGNOSIS — D689 Coagulation defect, unspecified: Secondary | ICD-10-CM | POA: Diagnosis not present

## 2023-01-02 DIAGNOSIS — N2581 Secondary hyperparathyroidism of renal origin: Secondary | ICD-10-CM | POA: Diagnosis not present

## 2023-01-04 DIAGNOSIS — E039 Hypothyroidism, unspecified: Secondary | ICD-10-CM | POA: Diagnosis not present

## 2023-01-04 DIAGNOSIS — D689 Coagulation defect, unspecified: Secondary | ICD-10-CM | POA: Diagnosis not present

## 2023-01-04 DIAGNOSIS — N2581 Secondary hyperparathyroidism of renal origin: Secondary | ICD-10-CM | POA: Diagnosis not present

## 2023-01-04 DIAGNOSIS — D631 Anemia in chronic kidney disease: Secondary | ICD-10-CM | POA: Diagnosis not present

## 2023-01-04 DIAGNOSIS — N186 End stage renal disease: Secondary | ICD-10-CM | POA: Diagnosis not present

## 2023-01-04 DIAGNOSIS — R519 Headache, unspecified: Secondary | ICD-10-CM | POA: Diagnosis not present

## 2023-01-04 DIAGNOSIS — Z992 Dependence on renal dialysis: Secondary | ICD-10-CM | POA: Diagnosis not present

## 2023-01-04 DIAGNOSIS — E1129 Type 2 diabetes mellitus with other diabetic kidney complication: Secondary | ICD-10-CM | POA: Diagnosis not present

## 2023-01-07 DIAGNOSIS — N2581 Secondary hyperparathyroidism of renal origin: Secondary | ICD-10-CM | POA: Diagnosis not present

## 2023-01-07 DIAGNOSIS — E1129 Type 2 diabetes mellitus with other diabetic kidney complication: Secondary | ICD-10-CM | POA: Diagnosis not present

## 2023-01-07 DIAGNOSIS — J029 Acute pharyngitis, unspecified: Secondary | ICD-10-CM | POA: Diagnosis not present

## 2023-01-07 DIAGNOSIS — D689 Coagulation defect, unspecified: Secondary | ICD-10-CM | POA: Diagnosis not present

## 2023-01-07 DIAGNOSIS — D631 Anemia in chronic kidney disease: Secondary | ICD-10-CM | POA: Diagnosis not present

## 2023-01-07 DIAGNOSIS — R519 Headache, unspecified: Secondary | ICD-10-CM | POA: Diagnosis not present

## 2023-01-07 DIAGNOSIS — Z992 Dependence on renal dialysis: Secondary | ICD-10-CM | POA: Diagnosis not present

## 2023-01-07 DIAGNOSIS — N186 End stage renal disease: Secondary | ICD-10-CM | POA: Diagnosis not present

## 2023-01-07 DIAGNOSIS — E039 Hypothyroidism, unspecified: Secondary | ICD-10-CM | POA: Diagnosis not present

## 2023-01-09 DIAGNOSIS — R519 Headache, unspecified: Secondary | ICD-10-CM | POA: Diagnosis not present

## 2023-01-09 DIAGNOSIS — E039 Hypothyroidism, unspecified: Secondary | ICD-10-CM | POA: Diagnosis not present

## 2023-01-09 DIAGNOSIS — Z992 Dependence on renal dialysis: Secondary | ICD-10-CM | POA: Diagnosis not present

## 2023-01-09 DIAGNOSIS — D689 Coagulation defect, unspecified: Secondary | ICD-10-CM | POA: Diagnosis not present

## 2023-01-09 DIAGNOSIS — D631 Anemia in chronic kidney disease: Secondary | ICD-10-CM | POA: Diagnosis not present

## 2023-01-09 DIAGNOSIS — N2581 Secondary hyperparathyroidism of renal origin: Secondary | ICD-10-CM | POA: Diagnosis not present

## 2023-01-09 DIAGNOSIS — E1129 Type 2 diabetes mellitus with other diabetic kidney complication: Secondary | ICD-10-CM | POA: Diagnosis not present

## 2023-01-09 DIAGNOSIS — N186 End stage renal disease: Secondary | ICD-10-CM | POA: Diagnosis not present

## 2023-01-11 DIAGNOSIS — D689 Coagulation defect, unspecified: Secondary | ICD-10-CM | POA: Diagnosis not present

## 2023-01-11 DIAGNOSIS — R519 Headache, unspecified: Secondary | ICD-10-CM | POA: Diagnosis not present

## 2023-01-11 DIAGNOSIS — E039 Hypothyroidism, unspecified: Secondary | ICD-10-CM | POA: Diagnosis not present

## 2023-01-11 DIAGNOSIS — D631 Anemia in chronic kidney disease: Secondary | ICD-10-CM | POA: Diagnosis not present

## 2023-01-11 DIAGNOSIS — N186 End stage renal disease: Secondary | ICD-10-CM | POA: Diagnosis not present

## 2023-01-11 DIAGNOSIS — N2581 Secondary hyperparathyroidism of renal origin: Secondary | ICD-10-CM | POA: Diagnosis not present

## 2023-01-11 DIAGNOSIS — E1129 Type 2 diabetes mellitus with other diabetic kidney complication: Secondary | ICD-10-CM | POA: Diagnosis not present

## 2023-01-11 DIAGNOSIS — Z992 Dependence on renal dialysis: Secondary | ICD-10-CM | POA: Diagnosis not present

## 2023-01-14 DIAGNOSIS — D631 Anemia in chronic kidney disease: Secondary | ICD-10-CM | POA: Diagnosis not present

## 2023-01-14 DIAGNOSIS — N186 End stage renal disease: Secondary | ICD-10-CM | POA: Diagnosis not present

## 2023-01-14 DIAGNOSIS — N2581 Secondary hyperparathyroidism of renal origin: Secondary | ICD-10-CM | POA: Diagnosis not present

## 2023-01-14 DIAGNOSIS — R519 Headache, unspecified: Secondary | ICD-10-CM | POA: Diagnosis not present

## 2023-01-14 DIAGNOSIS — E1129 Type 2 diabetes mellitus with other diabetic kidney complication: Secondary | ICD-10-CM | POA: Diagnosis not present

## 2023-01-14 DIAGNOSIS — D689 Coagulation defect, unspecified: Secondary | ICD-10-CM | POA: Diagnosis not present

## 2023-01-14 DIAGNOSIS — E039 Hypothyroidism, unspecified: Secondary | ICD-10-CM | POA: Diagnosis not present

## 2023-01-14 DIAGNOSIS — Z992 Dependence on renal dialysis: Secondary | ICD-10-CM | POA: Diagnosis not present

## 2023-01-17 DIAGNOSIS — Z780 Asymptomatic menopausal state: Secondary | ICD-10-CM | POA: Diagnosis not present

## 2023-01-17 DIAGNOSIS — M810 Age-related osteoporosis without current pathological fracture: Secondary | ICD-10-CM | POA: Diagnosis not present

## 2023-01-18 DIAGNOSIS — R519 Headache, unspecified: Secondary | ICD-10-CM | POA: Diagnosis not present

## 2023-01-18 DIAGNOSIS — E039 Hypothyroidism, unspecified: Secondary | ICD-10-CM | POA: Diagnosis not present

## 2023-01-18 DIAGNOSIS — E1129 Type 2 diabetes mellitus with other diabetic kidney complication: Secondary | ICD-10-CM | POA: Diagnosis not present

## 2023-01-18 DIAGNOSIS — D631 Anemia in chronic kidney disease: Secondary | ICD-10-CM | POA: Diagnosis not present

## 2023-01-18 DIAGNOSIS — N2581 Secondary hyperparathyroidism of renal origin: Secondary | ICD-10-CM | POA: Diagnosis not present

## 2023-01-18 DIAGNOSIS — Z992 Dependence on renal dialysis: Secondary | ICD-10-CM | POA: Diagnosis not present

## 2023-01-18 DIAGNOSIS — D689 Coagulation defect, unspecified: Secondary | ICD-10-CM | POA: Diagnosis not present

## 2023-01-18 DIAGNOSIS — N186 End stage renal disease: Secondary | ICD-10-CM | POA: Diagnosis not present

## 2023-01-21 DIAGNOSIS — R519 Headache, unspecified: Secondary | ICD-10-CM | POA: Diagnosis not present

## 2023-01-21 DIAGNOSIS — N186 End stage renal disease: Secondary | ICD-10-CM | POA: Diagnosis not present

## 2023-01-21 DIAGNOSIS — E039 Hypothyroidism, unspecified: Secondary | ICD-10-CM | POA: Diagnosis not present

## 2023-01-21 DIAGNOSIS — E1129 Type 2 diabetes mellitus with other diabetic kidney complication: Secondary | ICD-10-CM | POA: Diagnosis not present

## 2023-01-21 DIAGNOSIS — T8612 Kidney transplant failure: Secondary | ICD-10-CM | POA: Diagnosis not present

## 2023-01-21 DIAGNOSIS — D689 Coagulation defect, unspecified: Secondary | ICD-10-CM | POA: Diagnosis not present

## 2023-01-21 DIAGNOSIS — N2581 Secondary hyperparathyroidism of renal origin: Secondary | ICD-10-CM | POA: Diagnosis not present

## 2023-01-21 DIAGNOSIS — Z992 Dependence on renal dialysis: Secondary | ICD-10-CM | POA: Diagnosis not present

## 2023-01-21 DIAGNOSIS — D631 Anemia in chronic kidney disease: Secondary | ICD-10-CM | POA: Diagnosis not present

## 2023-01-25 DIAGNOSIS — Z992 Dependence on renal dialysis: Secondary | ICD-10-CM | POA: Diagnosis not present

## 2023-01-25 DIAGNOSIS — D689 Coagulation defect, unspecified: Secondary | ICD-10-CM | POA: Diagnosis not present

## 2023-01-25 DIAGNOSIS — N186 End stage renal disease: Secondary | ICD-10-CM | POA: Diagnosis not present

## 2023-01-25 DIAGNOSIS — N2581 Secondary hyperparathyroidism of renal origin: Secondary | ICD-10-CM | POA: Diagnosis not present

## 2023-01-25 DIAGNOSIS — E039 Hypothyroidism, unspecified: Secondary | ICD-10-CM | POA: Diagnosis not present

## 2023-01-25 DIAGNOSIS — D631 Anemia in chronic kidney disease: Secondary | ICD-10-CM | POA: Diagnosis not present

## 2023-01-26 ENCOUNTER — Telehealth: Payer: Self-pay | Admitting: Pulmonary Disease

## 2023-01-26 NOTE — Telephone Encounter (Signed)
Pt. Needs refills of generic budesonide-formoterol (SYMBICORT)  sent to pharmacy

## 2023-01-27 ENCOUNTER — Other Ambulatory Visit: Payer: Self-pay

## 2023-01-27 DIAGNOSIS — E039 Hypothyroidism, unspecified: Secondary | ICD-10-CM | POA: Diagnosis not present

## 2023-01-27 DIAGNOSIS — D631 Anemia in chronic kidney disease: Secondary | ICD-10-CM | POA: Diagnosis not present

## 2023-01-27 DIAGNOSIS — N186 End stage renal disease: Secondary | ICD-10-CM | POA: Diagnosis not present

## 2023-01-27 DIAGNOSIS — N2581 Secondary hyperparathyroidism of renal origin: Secondary | ICD-10-CM | POA: Diagnosis not present

## 2023-01-27 DIAGNOSIS — Z992 Dependence on renal dialysis: Secondary | ICD-10-CM | POA: Diagnosis not present

## 2023-01-27 DIAGNOSIS — D689 Coagulation defect, unspecified: Secondary | ICD-10-CM | POA: Diagnosis not present

## 2023-01-27 MED ORDER — BUDESONIDE-FORMOTEROL FUMARATE 160-4.5 MCG/ACT IN AERO: 2.0000 | INHALATION_SPRAY | Freq: Two times a day (BID) | RESPIRATORY_TRACT | 0 refills | Status: DC

## 2023-01-27 NOTE — Telephone Encounter (Signed)
Spoke with patient. Advised refill of Symbicort has been sent to pharmacy. NFN 

## 2023-01-28 DIAGNOSIS — D631 Anemia in chronic kidney disease: Secondary | ICD-10-CM | POA: Diagnosis not present

## 2023-01-28 DIAGNOSIS — N2581 Secondary hyperparathyroidism of renal origin: Secondary | ICD-10-CM | POA: Diagnosis not present

## 2023-01-28 DIAGNOSIS — N186 End stage renal disease: Secondary | ICD-10-CM | POA: Diagnosis not present

## 2023-01-28 DIAGNOSIS — D689 Coagulation defect, unspecified: Secondary | ICD-10-CM | POA: Diagnosis not present

## 2023-01-28 DIAGNOSIS — E039 Hypothyroidism, unspecified: Secondary | ICD-10-CM | POA: Diagnosis not present

## 2023-01-28 DIAGNOSIS — Z992 Dependence on renal dialysis: Secondary | ICD-10-CM | POA: Diagnosis not present

## 2023-01-30 DIAGNOSIS — Z992 Dependence on renal dialysis: Secondary | ICD-10-CM | POA: Diagnosis not present

## 2023-01-30 DIAGNOSIS — N2581 Secondary hyperparathyroidism of renal origin: Secondary | ICD-10-CM | POA: Diagnosis not present

## 2023-01-30 DIAGNOSIS — N186 End stage renal disease: Secondary | ICD-10-CM | POA: Diagnosis not present

## 2023-01-30 DIAGNOSIS — D689 Coagulation defect, unspecified: Secondary | ICD-10-CM | POA: Diagnosis not present

## 2023-01-30 DIAGNOSIS — E039 Hypothyroidism, unspecified: Secondary | ICD-10-CM | POA: Diagnosis not present

## 2023-01-30 DIAGNOSIS — D631 Anemia in chronic kidney disease: Secondary | ICD-10-CM | POA: Diagnosis not present

## 2023-02-01 DIAGNOSIS — E039 Hypothyroidism, unspecified: Secondary | ICD-10-CM | POA: Diagnosis not present

## 2023-02-01 DIAGNOSIS — D631 Anemia in chronic kidney disease: Secondary | ICD-10-CM | POA: Diagnosis not present

## 2023-02-01 DIAGNOSIS — Z992 Dependence on renal dialysis: Secondary | ICD-10-CM | POA: Diagnosis not present

## 2023-02-01 DIAGNOSIS — N186 End stage renal disease: Secondary | ICD-10-CM | POA: Diagnosis not present

## 2023-02-01 DIAGNOSIS — D689 Coagulation defect, unspecified: Secondary | ICD-10-CM | POA: Diagnosis not present

## 2023-02-01 DIAGNOSIS — N2581 Secondary hyperparathyroidism of renal origin: Secondary | ICD-10-CM | POA: Diagnosis not present

## 2023-02-04 DIAGNOSIS — D689 Coagulation defect, unspecified: Secondary | ICD-10-CM | POA: Diagnosis not present

## 2023-02-04 DIAGNOSIS — N186 End stage renal disease: Secondary | ICD-10-CM | POA: Diagnosis not present

## 2023-02-04 DIAGNOSIS — D631 Anemia in chronic kidney disease: Secondary | ICD-10-CM | POA: Diagnosis not present

## 2023-02-04 DIAGNOSIS — Z992 Dependence on renal dialysis: Secondary | ICD-10-CM | POA: Diagnosis not present

## 2023-02-04 DIAGNOSIS — E039 Hypothyroidism, unspecified: Secondary | ICD-10-CM | POA: Diagnosis not present

## 2023-02-04 DIAGNOSIS — N2581 Secondary hyperparathyroidism of renal origin: Secondary | ICD-10-CM | POA: Diagnosis not present

## 2023-02-05 DIAGNOSIS — M67442 Ganglion, left hand: Secondary | ICD-10-CM | POA: Diagnosis not present

## 2023-02-06 ENCOUNTER — Other Ambulatory Visit: Payer: Self-pay

## 2023-02-06 DIAGNOSIS — N186 End stage renal disease: Secondary | ICD-10-CM

## 2023-02-06 DIAGNOSIS — T827XXD Infection and inflammatory reaction due to other cardiac and vascular devices, implants and grafts, subsequent encounter: Secondary | ICD-10-CM

## 2023-02-07 NOTE — Telephone Encounter (Signed)
Was sent to pharmacy closing encounter

## 2023-02-08 DIAGNOSIS — Z992 Dependence on renal dialysis: Secondary | ICD-10-CM | POA: Diagnosis not present

## 2023-02-08 DIAGNOSIS — D689 Coagulation defect, unspecified: Secondary | ICD-10-CM | POA: Diagnosis not present

## 2023-02-08 DIAGNOSIS — N2581 Secondary hyperparathyroidism of renal origin: Secondary | ICD-10-CM | POA: Diagnosis not present

## 2023-02-08 DIAGNOSIS — D631 Anemia in chronic kidney disease: Secondary | ICD-10-CM | POA: Diagnosis not present

## 2023-02-08 DIAGNOSIS — N186 End stage renal disease: Secondary | ICD-10-CM | POA: Diagnosis not present

## 2023-02-08 DIAGNOSIS — E039 Hypothyroidism, unspecified: Secondary | ICD-10-CM | POA: Diagnosis not present

## 2023-02-11 DIAGNOSIS — N186 End stage renal disease: Secondary | ICD-10-CM | POA: Diagnosis not present

## 2023-02-11 DIAGNOSIS — Z992 Dependence on renal dialysis: Secondary | ICD-10-CM | POA: Diagnosis not present

## 2023-02-11 DIAGNOSIS — E039 Hypothyroidism, unspecified: Secondary | ICD-10-CM | POA: Diagnosis not present

## 2023-02-11 DIAGNOSIS — D631 Anemia in chronic kidney disease: Secondary | ICD-10-CM | POA: Diagnosis not present

## 2023-02-11 DIAGNOSIS — N2581 Secondary hyperparathyroidism of renal origin: Secondary | ICD-10-CM | POA: Diagnosis not present

## 2023-02-11 DIAGNOSIS — D689 Coagulation defect, unspecified: Secondary | ICD-10-CM | POA: Diagnosis not present

## 2023-02-15 DIAGNOSIS — N186 End stage renal disease: Secondary | ICD-10-CM | POA: Diagnosis not present

## 2023-02-15 DIAGNOSIS — Z992 Dependence on renal dialysis: Secondary | ICD-10-CM | POA: Diagnosis not present

## 2023-02-15 DIAGNOSIS — D689 Coagulation defect, unspecified: Secondary | ICD-10-CM | POA: Diagnosis not present

## 2023-02-15 DIAGNOSIS — N2581 Secondary hyperparathyroidism of renal origin: Secondary | ICD-10-CM | POA: Diagnosis not present

## 2023-02-15 DIAGNOSIS — E039 Hypothyroidism, unspecified: Secondary | ICD-10-CM | POA: Diagnosis not present

## 2023-02-15 DIAGNOSIS — D631 Anemia in chronic kidney disease: Secondary | ICD-10-CM | POA: Diagnosis not present

## 2023-02-18 DIAGNOSIS — N186 End stage renal disease: Secondary | ICD-10-CM | POA: Diagnosis not present

## 2023-02-18 DIAGNOSIS — D631 Anemia in chronic kidney disease: Secondary | ICD-10-CM | POA: Diagnosis not present

## 2023-02-18 DIAGNOSIS — Z992 Dependence on renal dialysis: Secondary | ICD-10-CM | POA: Diagnosis not present

## 2023-02-18 DIAGNOSIS — E039 Hypothyroidism, unspecified: Secondary | ICD-10-CM | POA: Diagnosis not present

## 2023-02-18 DIAGNOSIS — N2581 Secondary hyperparathyroidism of renal origin: Secondary | ICD-10-CM | POA: Diagnosis not present

## 2023-02-18 DIAGNOSIS — D689 Coagulation defect, unspecified: Secondary | ICD-10-CM | POA: Diagnosis not present

## 2023-02-21 DIAGNOSIS — T8612 Kidney transplant failure: Secondary | ICD-10-CM | POA: Diagnosis not present

## 2023-02-21 DIAGNOSIS — Z992 Dependence on renal dialysis: Secondary | ICD-10-CM | POA: Diagnosis not present

## 2023-02-21 DIAGNOSIS — N186 End stage renal disease: Secondary | ICD-10-CM | POA: Diagnosis not present

## 2023-02-22 DIAGNOSIS — Z992 Dependence on renal dialysis: Secondary | ICD-10-CM | POA: Diagnosis not present

## 2023-02-22 DIAGNOSIS — E039 Hypothyroidism, unspecified: Secondary | ICD-10-CM | POA: Diagnosis not present

## 2023-02-22 DIAGNOSIS — N186 End stage renal disease: Secondary | ICD-10-CM | POA: Diagnosis not present

## 2023-02-22 DIAGNOSIS — D631 Anemia in chronic kidney disease: Secondary | ICD-10-CM | POA: Diagnosis not present

## 2023-02-22 DIAGNOSIS — R52 Pain, unspecified: Secondary | ICD-10-CM | POA: Diagnosis not present

## 2023-02-22 DIAGNOSIS — D689 Coagulation defect, unspecified: Secondary | ICD-10-CM | POA: Diagnosis not present

## 2023-02-22 DIAGNOSIS — N2581 Secondary hyperparathyroidism of renal origin: Secondary | ICD-10-CM | POA: Diagnosis not present

## 2023-02-24 ENCOUNTER — Other Ambulatory Visit: Payer: Self-pay | Admitting: Pulmonary Disease

## 2023-02-25 DIAGNOSIS — Z992 Dependence on renal dialysis: Secondary | ICD-10-CM | POA: Diagnosis not present

## 2023-02-25 DIAGNOSIS — E039 Hypothyroidism, unspecified: Secondary | ICD-10-CM | POA: Diagnosis not present

## 2023-02-25 DIAGNOSIS — N2581 Secondary hyperparathyroidism of renal origin: Secondary | ICD-10-CM | POA: Diagnosis not present

## 2023-02-25 DIAGNOSIS — D689 Coagulation defect, unspecified: Secondary | ICD-10-CM | POA: Diagnosis not present

## 2023-02-25 DIAGNOSIS — D631 Anemia in chronic kidney disease: Secondary | ICD-10-CM | POA: Diagnosis not present

## 2023-02-25 DIAGNOSIS — R52 Pain, unspecified: Secondary | ICD-10-CM | POA: Diagnosis not present

## 2023-02-25 DIAGNOSIS — N186 End stage renal disease: Secondary | ICD-10-CM | POA: Diagnosis not present

## 2023-02-26 ENCOUNTER — Encounter: Payer: Self-pay | Admitting: Pulmonary Disease

## 2023-02-26 ENCOUNTER — Ambulatory Visit (INDEPENDENT_AMBULATORY_CARE_PROVIDER_SITE_OTHER): Payer: Medicare Other | Admitting: Pulmonary Disease

## 2023-02-26 VITALS — BP 136/64 | HR 60 | Temp 98.2°F | Ht 65.0 in | Wt 102.2 lb

## 2023-02-26 DIAGNOSIS — R053 Chronic cough: Secondary | ICD-10-CM | POA: Diagnosis not present

## 2023-02-26 DIAGNOSIS — J45909 Unspecified asthma, uncomplicated: Secondary | ICD-10-CM

## 2023-02-26 NOTE — Patient Instructions (Signed)
I am glad you are stable with regard to your breathing and cough Continue the inhalers as prescribed Follow-up in 1 year.

## 2023-02-26 NOTE — Progress Notes (Signed)
Kimberly [name]    [id number]    Nov 04, 1955  Primary Care Physician:[name], Sonny Masters, MD  Referring Physician: Merri Brunette, MD [address] [place],  [place] [address]  Chief complaint: Chronic cough, carcinoid of the lung   HPI: 67 y.o.  with a history of chronic cough, severe persistent asthma, benign carcinoid of the lung, irritable larynx.  Previously followed at [facility].  She has history of end-stage renal disease as a result of IgA nephropathy and had a transplant in 03-15-2010 which subsequently failed in Mar 15, 2014 and is currently on hemodialysis  Her pulmonary history consists of chronic cough dating back to age of 80 which has been resistant to multiple treatments including treatment for postnasal drip, GERD.  She has severe asthma treated with Spiriva and Symbicort.  Biologics have been discussed with her in the past but she is not on those at present.  There is a question of DIPNECH syndrome as she has history of benign carcinoid and at some point was given octreotide injections at [facility] but stopped after 3 weeks due to abdominal pain and diarrhea.  She was seen at [facility] pulmonary in 03/15/2014 for left apical mass for left apical mass measuring 4.1 cm.  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  She sees Dr. Zenovia Jordan for management of rheumatoid arthritis.  Pets: She had a dog that recently died in 03-15-21 Occupation: Retired Human resources officer Exposures: No mold, hot tub, Financial controller.  No feather pillows or comforters Smoking history: Never smoker Travel history: Originally from [place].  No significant travel history Relevant family history: Mother died of emphysema.  She was a smoker.  Interim  history: Continues to have significant cough which is limiting her quality of life.  Has seen Dr. Delford Field, ENT at [facility] was diagnosed with LPR.  She is not on any antiacid therapy as he has not worked in the past but she is getting speech therapy.  Continues to have dyspnea on exertion, dyspnea which is stable Breztri and Symbicort has been denied by insurance and she is on generic Symbicort and Spiriva  Outpatient Encounter Medications as of 02/26/2023  Medication Sig   albuterol (VENTOLIN HFA) 108 (90 Base) MCG/ACT inhaler Inhale 2 puffs into the lungs every 6 (six) hours as needed for shortness of breath or wheezing (Asthma).   amLODipine (NORVASC) 10 MG tablet Take 10 mg by mouth daily.   budesonide-formoterol (SYMBICORT) 160-4.5 MCG/ACT inhaler INHALE 2 PUFFS INTO THE LUNGS TWICE DAILY   cinacalcet (SENSIPAR) 30 MG tablet Take 60 mg by mouth daily after supper. Taking 60mg  daily   EPOETIN ALFA IJ Inject into the skin See admin instructions. Inject  subcutaneously at dialysis as needed for low hemoglobin   Garlic 1000 MG CAPS Take 1,000 mg by mouth in the morning, at noon, and at bedtime.   hydroxychloroquine (PLAQUENIL) 200 MG tablet Take 200 mg by mouth daily.   levothyroxine (SYNTHROID, LEVOTHROID) 50 MCG tablet Take 50-75 mcg by mouth See admin instructions. Take  1 1/2 tablets (75 mcg) by mouth daily on Tuesday and Thursday nights, take 1 tablet (50 mcg) by mouth daily on all other nights of the week   losartan (COZAAR) 100 MG tablet Take  100 mg by mouth daily.   Multiple Vitamins-Minerals (ZINC PO) Take 22 mg by mouth daily.   OVER THE COUNTER MEDICATION Take 4 capsules by mouth 2 (two) times a day. Juice plus - 1 fruit, 1 vegetable, 1 berries, 1 omega - twice daily   predniSONE (DELTASONE) 5 MG tablet Take 5 mg by mouth daily with breakfast.   Probiotic Product (PROBIOTIC DAILY PO) Take 1 tablet by mouth daily.   PSYLLIUM PO Take 3 capsules by mouth daily at 2 PM.    SEVELAMER HCL  PO Take 2 tablets by mouth in the morning and at bedtime.   tiotropium (SPIRIVA) 18 MCG inhalation capsule Place 1 capsule (18 mcg total) into inhaler and inhale daily.   No facility-administered encounter medications on file as of 02/26/2023.    Physical Exam: Blood pressure 136/64, pulse 60, temperature 98.2 F (36.8 C), temperature source Oral, height 5\' 5"  (1.651 m), weight 102 lb 3.2 oz (46.4 kg), SpO2 99 %. Gen:      No acute distress HEENT:  EOMI, sclera anicteric Neck:     No masses; no thyromegaly Lungs:    Clear to auscultation bilaterally; normal respiratory effort CV:         Regular rate and rhythm; no murmurs Abd:      + bowel sounds; soft, non-tender; no palpable masses, no distension Ext:    No edema; adequate peripheral perfusion Skin:      Warm and dry; no rash Neuro: alert and oriented x 3 Psych: normal mood and affect   Data Reviewed: Imaging: CT high-resolution 08/10/2020-pulmonary pattern of upper and middle lobe peribronchial nodularity with mosaic lung attenuation and calcifications.  Right middle lobe nodule, enlarged pulmonary artery trunk.    CT high-resolution 02/22/2022-stable pattern of upper and middle lobe nodularity, mosaic lung attenuation.  I have reviewed the images personally.  PFTs: 09/12/2025 FVC 2.67 [94%], FEV1 1.41 [50%], F/F 53, TLC 6.03 [112%], DLCO 15.73 [58%] Severe obstruction with bronchodilator response, moderate diffusion defect  05/13/2022 FVC 2.06 [60%], FEV1 1.16 [46%], F/F 56, TLC 5.67 [99%], DLCO 11.41 [55%] Severe obstruction, moderate diffusion defect  ACT score 02/26/2023- 17  Labs: CBC 09/22/2021-WBC 5.9, eos 4.7%, absolute eosinophil count 277  Labs from nephrology CBC 01/21/2022-WBC 4.07, hemoglobin 9.3, eosinophils 4.7% Metabolic panel 01/21/2022 significant for BUN/creatinine 58/7.81  Assessment:  Severe asthma, chronic cough She has chronic cough for the past 25 years which has been resistant to treatment.  She has  postnasal drip for which she will try Flonase and chlorpheniramine over-the-counter Consider reinitiating GERD therapy but she tells me that this has not helped in the past Continue gender Symbicort and Spiriva  Can consider Neurontin for neurogenic cough.  Follow-up with Dr. 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  In addition she also has rheumatoid arthritis which can cause interstitial lung disease  CT reviewed with stable findings.  This is not typical of rheumatoid arthritis and currently present Syndrome.  We will continue to follow for now as treatment consists of inhalers.  She has not tolerated octreotide in the past.  We discussed a follow-up CT next year but she would like to avoid any radiation if possible.  Continue monitoring symptoms for now.  Plan/Recommendations: Continue inhalers Flonase, chlorpheniramine for postnasal drip.  Chilton Greathouse MD [place] [facility]  02/26/2023, 9:26 AM  CC: Merri Brunette, MD

## 2023-02-27 DIAGNOSIS — Z992 Dependence on renal dialysis: Secondary | ICD-10-CM | POA: Diagnosis not present

## 2023-02-27 DIAGNOSIS — E039 Hypothyroidism, unspecified: Secondary | ICD-10-CM | POA: Diagnosis not present

## 2023-02-27 DIAGNOSIS — N186 End stage renal disease: Secondary | ICD-10-CM | POA: Diagnosis not present

## 2023-02-27 DIAGNOSIS — N2581 Secondary hyperparathyroidism of renal origin: Secondary | ICD-10-CM | POA: Diagnosis not present

## 2023-02-27 DIAGNOSIS — D631 Anemia in chronic kidney disease: Secondary | ICD-10-CM | POA: Diagnosis not present

## 2023-02-27 DIAGNOSIS — R52 Pain, unspecified: Secondary | ICD-10-CM | POA: Diagnosis not present

## 2023-02-27 DIAGNOSIS — D689 Coagulation defect, unspecified: Secondary | ICD-10-CM | POA: Diagnosis not present

## 2023-03-01 DIAGNOSIS — D689 Coagulation defect, unspecified: Secondary | ICD-10-CM | POA: Diagnosis not present

## 2023-03-01 DIAGNOSIS — N186 End stage renal disease: Secondary | ICD-10-CM | POA: Diagnosis not present

## 2023-03-01 DIAGNOSIS — E039 Hypothyroidism, unspecified: Secondary | ICD-10-CM | POA: Diagnosis not present

## 2023-03-01 DIAGNOSIS — D631 Anemia in chronic kidney disease: Secondary | ICD-10-CM | POA: Diagnosis not present

## 2023-03-01 DIAGNOSIS — Z992 Dependence on renal dialysis: Secondary | ICD-10-CM | POA: Diagnosis not present

## 2023-03-01 DIAGNOSIS — N2581 Secondary hyperparathyroidism of renal origin: Secondary | ICD-10-CM | POA: Diagnosis not present

## 2023-03-01 DIAGNOSIS — R52 Pain, unspecified: Secondary | ICD-10-CM | POA: Diagnosis not present

## 2023-03-04 DIAGNOSIS — E039 Hypothyroidism, unspecified: Secondary | ICD-10-CM | POA: Diagnosis not present

## 2023-03-04 DIAGNOSIS — R52 Pain, unspecified: Secondary | ICD-10-CM | POA: Diagnosis not present

## 2023-03-04 DIAGNOSIS — N2581 Secondary hyperparathyroidism of renal origin: Secondary | ICD-10-CM | POA: Diagnosis not present

## 2023-03-04 DIAGNOSIS — N186 End stage renal disease: Secondary | ICD-10-CM | POA: Diagnosis not present

## 2023-03-04 DIAGNOSIS — D689 Coagulation defect, unspecified: Secondary | ICD-10-CM | POA: Diagnosis not present

## 2023-03-04 DIAGNOSIS — D631 Anemia in chronic kidney disease: Secondary | ICD-10-CM | POA: Diagnosis not present

## 2023-03-04 DIAGNOSIS — Z992 Dependence on renal dialysis: Secondary | ICD-10-CM | POA: Diagnosis not present

## 2023-03-05 ENCOUNTER — Other Ambulatory Visit: Payer: Self-pay | Admitting: Orthopedic Surgery

## 2023-03-05 DIAGNOSIS — L720 Epidermal cyst: Secondary | ICD-10-CM | POA: Diagnosis not present

## 2023-03-05 DIAGNOSIS — M67442 Ganglion, left hand: Secondary | ICD-10-CM | POA: Diagnosis not present

## 2023-03-08 DIAGNOSIS — N2581 Secondary hyperparathyroidism of renal origin: Secondary | ICD-10-CM | POA: Diagnosis not present

## 2023-03-08 DIAGNOSIS — D689 Coagulation defect, unspecified: Secondary | ICD-10-CM | POA: Diagnosis not present

## 2023-03-08 DIAGNOSIS — D631 Anemia in chronic kidney disease: Secondary | ICD-10-CM | POA: Diagnosis not present

## 2023-03-08 DIAGNOSIS — R52 Pain, unspecified: Secondary | ICD-10-CM | POA: Diagnosis not present

## 2023-03-08 DIAGNOSIS — E039 Hypothyroidism, unspecified: Secondary | ICD-10-CM | POA: Diagnosis not present

## 2023-03-08 DIAGNOSIS — Z992 Dependence on renal dialysis: Secondary | ICD-10-CM | POA: Diagnosis not present

## 2023-03-08 DIAGNOSIS — N186 End stage renal disease: Secondary | ICD-10-CM | POA: Diagnosis not present

## 2023-03-10 DIAGNOSIS — N2581 Secondary hyperparathyroidism of renal origin: Secondary | ICD-10-CM | POA: Diagnosis not present

## 2023-03-10 DIAGNOSIS — R52 Pain, unspecified: Secondary | ICD-10-CM | POA: Diagnosis not present

## 2023-03-10 DIAGNOSIS — E039 Hypothyroidism, unspecified: Secondary | ICD-10-CM | POA: Diagnosis not present

## 2023-03-10 DIAGNOSIS — D631 Anemia in chronic kidney disease: Secondary | ICD-10-CM | POA: Diagnosis not present

## 2023-03-10 DIAGNOSIS — Z992 Dependence on renal dialysis: Secondary | ICD-10-CM | POA: Diagnosis not present

## 2023-03-10 DIAGNOSIS — N186 End stage renal disease: Secondary | ICD-10-CM | POA: Diagnosis not present

## 2023-03-10 DIAGNOSIS — D689 Coagulation defect, unspecified: Secondary | ICD-10-CM | POA: Diagnosis not present

## 2023-03-13 DIAGNOSIS — D689 Coagulation defect, unspecified: Secondary | ICD-10-CM | POA: Diagnosis not present

## 2023-03-13 DIAGNOSIS — N186 End stage renal disease: Secondary | ICD-10-CM | POA: Diagnosis not present

## 2023-03-13 DIAGNOSIS — R52 Pain, unspecified: Secondary | ICD-10-CM | POA: Diagnosis not present

## 2023-03-13 DIAGNOSIS — D631 Anemia in chronic kidney disease: Secondary | ICD-10-CM | POA: Diagnosis not present

## 2023-03-13 DIAGNOSIS — E039 Hypothyroidism, unspecified: Secondary | ICD-10-CM | POA: Diagnosis not present

## 2023-03-13 DIAGNOSIS — Z992 Dependence on renal dialysis: Secondary | ICD-10-CM | POA: Diagnosis not present

## 2023-03-13 DIAGNOSIS — N2581 Secondary hyperparathyroidism of renal origin: Secondary | ICD-10-CM | POA: Diagnosis not present

## 2023-03-15 DIAGNOSIS — R52 Pain, unspecified: Secondary | ICD-10-CM | POA: Diagnosis not present

## 2023-03-15 DIAGNOSIS — N2581 Secondary hyperparathyroidism of renal origin: Secondary | ICD-10-CM | POA: Diagnosis not present

## 2023-03-15 DIAGNOSIS — D631 Anemia in chronic kidney disease: Secondary | ICD-10-CM | POA: Diagnosis not present

## 2023-03-15 DIAGNOSIS — E039 Hypothyroidism, unspecified: Secondary | ICD-10-CM | POA: Diagnosis not present

## 2023-03-15 DIAGNOSIS — Z992 Dependence on renal dialysis: Secondary | ICD-10-CM | POA: Diagnosis not present

## 2023-03-15 DIAGNOSIS — D689 Coagulation defect, unspecified: Secondary | ICD-10-CM | POA: Diagnosis not present

## 2023-03-15 DIAGNOSIS — N186 End stage renal disease: Secondary | ICD-10-CM | POA: Diagnosis not present

## 2023-03-18 DIAGNOSIS — N2581 Secondary hyperparathyroidism of renal origin: Secondary | ICD-10-CM | POA: Diagnosis not present

## 2023-03-18 DIAGNOSIS — R52 Pain, unspecified: Secondary | ICD-10-CM | POA: Diagnosis not present

## 2023-03-18 DIAGNOSIS — E039 Hypothyroidism, unspecified: Secondary | ICD-10-CM | POA: Diagnosis not present

## 2023-03-18 DIAGNOSIS — D689 Coagulation defect, unspecified: Secondary | ICD-10-CM | POA: Diagnosis not present

## 2023-03-18 DIAGNOSIS — N186 End stage renal disease: Secondary | ICD-10-CM | POA: Diagnosis not present

## 2023-03-18 DIAGNOSIS — D631 Anemia in chronic kidney disease: Secondary | ICD-10-CM | POA: Diagnosis not present

## 2023-03-18 DIAGNOSIS — Z992 Dependence on renal dialysis: Secondary | ICD-10-CM | POA: Diagnosis not present

## 2023-03-20 DIAGNOSIS — D689 Coagulation defect, unspecified: Secondary | ICD-10-CM | POA: Diagnosis not present

## 2023-03-20 DIAGNOSIS — E039 Hypothyroidism, unspecified: Secondary | ICD-10-CM | POA: Diagnosis not present

## 2023-03-20 DIAGNOSIS — Z992 Dependence on renal dialysis: Secondary | ICD-10-CM | POA: Diagnosis not present

## 2023-03-20 DIAGNOSIS — D631 Anemia in chronic kidney disease: Secondary | ICD-10-CM | POA: Diagnosis not present

## 2023-03-20 DIAGNOSIS — R52 Pain, unspecified: Secondary | ICD-10-CM | POA: Diagnosis not present

## 2023-03-20 DIAGNOSIS — N186 End stage renal disease: Secondary | ICD-10-CM | POA: Diagnosis not present

## 2023-03-20 DIAGNOSIS — N2581 Secondary hyperparathyroidism of renal origin: Secondary | ICD-10-CM | POA: Diagnosis not present

## 2023-03-22 DIAGNOSIS — N186 End stage renal disease: Secondary | ICD-10-CM | POA: Diagnosis not present

## 2023-03-22 DIAGNOSIS — R52 Pain, unspecified: Secondary | ICD-10-CM | POA: Diagnosis not present

## 2023-03-22 DIAGNOSIS — Z992 Dependence on renal dialysis: Secondary | ICD-10-CM | POA: Diagnosis not present

## 2023-03-22 DIAGNOSIS — N2581 Secondary hyperparathyroidism of renal origin: Secondary | ICD-10-CM | POA: Diagnosis not present

## 2023-03-22 DIAGNOSIS — E039 Hypothyroidism, unspecified: Secondary | ICD-10-CM | POA: Diagnosis not present

## 2023-03-22 DIAGNOSIS — D689 Coagulation defect, unspecified: Secondary | ICD-10-CM | POA: Diagnosis not present

## 2023-03-22 DIAGNOSIS — D631 Anemia in chronic kidney disease: Secondary | ICD-10-CM | POA: Diagnosis not present

## 2023-03-23 DIAGNOSIS — T8612 Kidney transplant failure: Secondary | ICD-10-CM | POA: Diagnosis not present

## 2023-03-23 DIAGNOSIS — Z992 Dependence on renal dialysis: Secondary | ICD-10-CM | POA: Diagnosis not present

## 2023-03-23 DIAGNOSIS — N186 End stage renal disease: Secondary | ICD-10-CM | POA: Diagnosis not present

## 2023-03-24 ENCOUNTER — Other Ambulatory Visit: Payer: Self-pay

## 2023-03-24 ENCOUNTER — Encounter (HOSPITAL_COMMUNITY): Payer: Self-pay | Admitting: Vascular Surgery

## 2023-03-24 NOTE — Pre-Procedure Instructions (Signed)
PCP - Dr. Merri Brunette Cardiologist - Rollene Rotunda, MD  EKG - DOS Stress Test - 06/12/22 ECHO - 06/12/22  CPAP - Wears mouth guard  Anesthesia review: Y  Patient verbally denies any shortness of breath, fever, cough and chest pain during phone call   -------------  SDW INSTRUCTIONS given:  Your procedure is scheduled on Friday, July 5th.  Report to [facility] Main Entrance "A" at 0650 A.M., and check in at the Admitting office.  Call this number if you have problems the morning of surgery:  [phone]   Remember:  Do not eat or drink after midnight the night before your surgery    Take these medicines the morning of surgery with A SIP OF WATER  amLODipine (NORVASC)  SYMBICORT  PLAQUENIL  predniSONE  SPIRIVA  albuterol (VENTOLIN HFA)-if needed  As of today, STOP taking any Aspirin (unless otherwise instructed by your surgeon) Aleve, Naproxen, Ibuprofen, Motrin, Advil, Goody's, BC's, all herbal medications, fish oil, and all vitamins.                      Do not wear jewelry, make up, or nail polish            Do not wear lotions, powders, perfumes/colognes, or deodorant.            Do not shave 48 hours prior to surgery.  Men may shave face and neck.            Do not bring valuables to the hospital.            [facility] is not responsible for any belongings or valuables.  Do NOT Smoke (Tobacco/Vaping) 24 hours prior to your procedure If you use a CPAP at night, you may bring all equipment for your overnight stay.   Contacts, glasses, dentures or bridgework may not be worn into surgery.      For patients admitted to the hospital, discharge time will be determined by your treatment team.   Patients discharged the day of surgery will not be allowed to drive home, and someone needs to stay with them for 24 hours.    Special instructions:   [place]- Preparing For Surgery  Before surgery, you can play an important role. Because skin is not sterile, your  skin needs to be as free of germs as possible. You can reduce the number of germs on your skin by washing with CHG (chlorahexidine gluconate) Soap before surgery.  CHG is an antiseptic cleaner which kills germs and bonds with the skin to continue killing germs even after washing.    Oral Hygiene is also important to reduce your risk of infection.  Remember - BRUSH YOUR TEETH THE MORNING OF SURGERY WITH YOUR REGULAR TOOTHPASTE  Please do not use if you have an allergy to CHG or antibacterial soaps. If your skin becomes reddened/irritated stop using the CHG.  Do not shave (including legs and underarms) for at least 48 hours prior to first CHG shower. It is OK to shave your face.  Please follow these instructions carefully.   Shower the NIGHT BEFORE SURGERY and the MORNING OF SURGERY with DIAL Soap.   Pat yourself dry with a CLEAN TOWEL.  Wear CLEAN PAJAMAS to bed the night before surgery  Place CLEAN SHEETS on your bed the night of your first shower and DO NOT SLEEP WITH PETS.   Day of Surgery: Please shower morning of surgery  Wear Clean/Comfortable clothing the morning  of surgery Do not apply any deodorants/lotions.   Remember to brush your teeth WITH YOUR REGULAR TOOTHPASTE.   Questions were answered. Patient verbalized understanding of instructions.

## 2023-03-25 DIAGNOSIS — E1129 Type 2 diabetes mellitus with other diabetic kidney complication: Secondary | ICD-10-CM | POA: Diagnosis not present

## 2023-03-25 DIAGNOSIS — E039 Hypothyroidism, unspecified: Secondary | ICD-10-CM | POA: Diagnosis not present

## 2023-03-25 DIAGNOSIS — N2581 Secondary hyperparathyroidism of renal origin: Secondary | ICD-10-CM | POA: Diagnosis not present

## 2023-03-25 DIAGNOSIS — D631 Anemia in chronic kidney disease: Secondary | ICD-10-CM | POA: Diagnosis not present

## 2023-03-25 DIAGNOSIS — D689 Coagulation defect, unspecified: Secondary | ICD-10-CM | POA: Diagnosis not present

## 2023-03-25 DIAGNOSIS — N186 End stage renal disease: Secondary | ICD-10-CM | POA: Diagnosis not present

## 2023-03-25 DIAGNOSIS — Z992 Dependence on renal dialysis: Secondary | ICD-10-CM | POA: Diagnosis not present

## 2023-03-25 DIAGNOSIS — R519 Headache, unspecified: Secondary | ICD-10-CM | POA: Diagnosis not present

## 2023-03-25 NOTE — Anesthesia Preprocedure Evaluation (Addendum)
Anesthesia Evaluation  Patient identified by MRN, date of birth, ID band Patient awake    Reviewed: Allergy & Precautions, NPO status , Patient's Chart, lab work & pertinent test results  Airway Mallampati: II  TM Distance: >3 FB Neck ROM: Full    Dental  (+) Dental Advisory Given   Pulmonary asthma , sleep apnea    breath sounds clear to auscultation       Cardiovascular hypertension, Pt. on medications + dysrhythmias  Rhythm:Regular Rate:Normal  Echo 06/12/22: IMPRESSIONS  1. Left ventricular ejection fraction, by estimation, is 55%. The left  ventricle has normal function. The left ventricle has no regional wall  motion abnormalities. Left ventricular diastolic parameters are consistent  with Grade II diastolic dysfunction  (pseudonormalization).  2. Right ventricular systolic function is normal. The right ventricular  size is normal. There is normal pulmonary artery systolic pressure. The  estimated right ventricular systolic pressure is 18.7 mmHg.  3. The mitral valve is normal in structure. Trivial mitral valve  regurgitation. No evidence of mitral stenosis.  4. The aortic valve is tricuspid. There is moderate calcification of the  aortic valve. Aortic valve regurgitation is not visualized. Aortic valve  sclerosis/calcification is present, without any evidence of aortic  stenosis. Aortic valve mean gradient  measures 8.0 mmHg.  5. Aortic dilatation noted. There is mild dilatation of the ascending  aorta, measuring 38 mm.  6. The inferior vena cava is normal in size with greater than 50%  respiratory variability, suggesting right atrial pressure of 3 mmHg.    Nuclear stress test 06/12/22:   The study is normal. The study is low risk.   horizontal ST depression was noted. <62mm inferior ST depression noted during stress in inferior leads   LV perfusion is normal. There is no evidence of ischemia. There is no  evidence of infarction.   Left ventricular function is normal. Nuclear stress EF: 67 %. The left ventricular ejection fraction is hyperdynamic (>65%). End diastolic cavity size is normal. End systolic cavity size is normal.   Prior study available for comparison from 08/25/2015.  1. Fixed apical inferior perfusion defect with normal wall motion, consistent with artifact 2. Low risk study     Neuro/Psych CVA    GI/Hepatic Neg liver ROS, hiatal hernia,GERD  ,,  Endo/Other  diabetes, Type 2Hypothyroidism    Renal/GU ESRF and DialysisRenal disease     Musculoskeletal  (+) Arthritis ,    Abdominal   Peds  Hematology  (+) Blood dyscrasia, anemia   Anesthesia Other Findings   Reproductive/Obstetrics                             Anesthesia Physical Anesthesia Plan  ASA: 4  Anesthesia Plan: General   Post-op Pain Management: Tylenol PO (pre-op)*   Induction: Intravenous  PONV Risk Score and Plan: 3 and Dexamethasone, Ondansetron, Propofol infusion and TIVA  Airway Management Planned: LMA  Additional Equipment:   Intra-op Plan:   Post-operative Plan: Extubation in OR  Informed Consent: I have reviewed the patients History and Physical, chart, labs and discussed the procedure including the risks, benefits and alternatives for the proposed anesthesia with the patient or authorized representative who has indicated his/her understanding and acceptance.     Dental advisory given  Plan Discussed with: CRNA  Anesthesia Plan Comments: (  )       Anesthesia Quick Evaluation

## 2023-03-25 NOTE — Progress Notes (Signed)
Anesthesia Chart Review:  Case: [id number] Date/Time: 03/28/23 6962   Procedure: LEFT ARM ARTERIOVENOUS GORETEX GRAFT EXCISION (Left)   Anesthesia type: Choice   Pre-op diagnosis: ESRD; Infection and inflammatory reaction due to vascular device, implant, and graft   [facility] RO[facility] / [facility]   Surgeons: Maeola Harman, MD       DISCUSSION: Patient is a 67 year old female scheduled for the above procedure. She has a draining sinus from a LUE AVGG. Dr. Randie Heinz discussed excision of the graft back in March and was advised to call and scheduled when she desired to proceed. She was using a RUE AVGG for HD at that time.   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 Isaiah Serge for chronic cough, asthma, possible ILD. He notes chronic cough for 25 years that has been resistant to treatment. Diagnosed with LPR by ENT but did not have great benefit from PPI/antacid therapy. Recommended trying Flonase, OTC chlorpheniramine. Consider reinitiating GERD therapy. Continue Symbicort, Spiriva. 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  In addition she also has rheumatoid arthritis which can cause interstitial lung disease   CT reviewed with stable findings.  This is not typical of rheumatoid arthritis and currently present Syndrome.  We will continue to follow for now as treatment consists of inhalers.  She has not tolerated octreotide in the past." She declined repeat CT in a year as she wanted to avoid radiation as much as possible.   Last visit with cardiologist Dr. Antoine Poche was on 08/30/22. He felt dyspnea was likely pulmonary related given recent cardiac testing results see below CV). Murmur was attributed to mild AV sclerosis and possibly related to dialysis catheter. No definite afib on monitor. Palpitations not particularly symptomatic. Volume status managed via HD. Continue current therapy. No further cardiac testing recommended at that time.   Anesthesia team to evaluate on the day of surgery.   VS: Ht 5\' 5"  (1.651 m)   Wt 45.4 kg   BMI 16.64 kg/m  BP Readings from Last 3 Encounters:  02/26/23 136/64  10/23/22 (!) 154/78  10/10/22 (!) 153/81   Pulse Readings from Last 3 Encounters:  02/26/23 60  10/23/22 63  10/10/22 60     PROVIDERS: Merri Brunette, MD is PCP  Rollene Rotunda, MD is cardiologist Chilton Greathouse, MD is pulmonlogist Harriette Ohara, MD is ENT Zenovia Jordan, MD is rheumatologist   LABS: For day of surgery.   IMAGES: CT Chest High Resolution 02/22/22: IMPRESSION: 1. The appearance of the lungs is very similar to the prior study once again demonstrating peribronchovascular predominant ground-glass attenuation and micro nodularity which is most evident throughout the mid to upper lungs bilaterally. This  appearance is nonspecific, but could be seen in the setting of occupational exposure such as silicosis, although the relative lack of  calcified lesions would be unusual. Sarcoidosis remains a differential consideration, although subpleural nodularity is not a dominant feature in the lungs, and there is no lymphadenopathy. The overall disease burden is very similar to the prior examination. 2. Stable right middle lobe nodule, considered benign (likely a hamartoma). 3. Small chronic pleural effusions are unchanged. 4. Aortic atherosclerosis, in addition to two-vessel coronary artery disease. Please note that although the presence of coronary artery calcium documents the presence of coronary artery disease, the severity of this disease and any potential stenosis cannot be assessed on this non-gated CT examination. Assessment for potential risk factor modification, dietary therapy or pharmacologic therapy may be warranted, if clinically indicated. 5. There are calcifications of the aortic valve and mitral annulus. Echocardiographic correlation for evaluation of potential valvular dysfunction may be warranted if clinically indicated.     EKG: 05/24/22 ([facility]): Normal sinus rhythm.  Possible left atrial enlargement.  Rightward axis.   CV: Long term ZioXT monitor 07/06/22 - 07/19/22: Normal sinus rhythm One run of non sustained ventricular tachycardia lasting 5 beats.  Occasional runs of SVT with longest run being 9 beats   Echo 06/12/22: IMPRESSIONS   1. Left ventricular ejection fraction, by estimation, is 55%. The left  ventricle has normal function. The left ventricle has no regional wall  motion abnormalities. Left ventricular diastolic parameters are consistent  with Grade II diastolic dysfunction  (pseudonormalization).   2. Right ventricular systolic function is normal. The right ventricular  size is normal. There is normal pulmonary artery systolic pressure. The  estimated right ventricular systolic pressure is 18.7 mmHg.   3. The mitral valve is normal in structure. Trivial mitral valve   regurgitation. No evidence of mitral stenosis.   4. The aortic valve is tricuspid. There is moderate calcification of the  aortic valve. Aortic valve regurgitation is not visualized. Aortic valve  sclerosis/calcification is present, without any evidence of aortic  stenosis. Aortic valve mean gradient  measures 8.0 mmHg.   5. Aortic dilatation noted. There is mild dilatation of the ascending  aorta, measuring 38 mm.   6. The inferior vena cava is normal in size with greater than 50%  respiratory variability, suggesting right atrial pressure of 3 mmHg.    Nuclear stress test 06/12/22:   The study is normal. The study is low risk.   horizontal ST depression was noted. <10mm inferior ST depression noted during stress in inferior leads   LV perfusion is normal. There is no evidence of ischemia. There is no evidence of infarction.   Left ventricular function is normal. Nuclear stress EF: 67 %. The left ventricular ejection fraction is hyperdynamic (>65%). End diastolic cavity size is normal. End systolic cavity size is normal.   Prior study available for comparison from 08/25/2015.   Fixed apical inferior perfusion defect with normal wall motion, consistent with artifact Low risk study   US Carotid 08/26/17: IMPRESSION: Color duplex indicates minimal heterogeneous plaque, with no hemodynamically significant stenosis by duplex criteria in the extracranial cerebrovascular circulation.  Past Medical History:  Diagnosis Date   Anemia    Arthritis    Asthma    Complication of anesthesia    Slow to awaken and AMS- took 1 month to memory and recall to return   COVID    x2   Diverticulosis    First degree AV block  GERD (gastroesophageal reflux disease)    History of blood transfusion    History of chronic cough    History of hiatal hernia    History of hoarseness    Hypertension    history; resolved on dialysis   Hypothyroidism    IgA nephropathy    ESRD - Hemo TTH Sat    Premature atrial beat    Premature ventricular beat    Renal transplant failure and rejection 2012   Shingles 03/2011   Sleep apnea    uses a mouth guard   Stroke [facility]) October 2010, March 2013   cerebral aneurysm, 07/2017    Past Surgical History:  Procedure Laterality Date   AV FISTULA PLACEMENT Left 05/20/2018   Procedure: INSERTION OF ARTERIOVENOUS (AV) GORE-TEX GRAFT LEFT  ARM;  Surgeon: Maeola Harman, MD;  Location: [facility] OR;  Service: Vascular;  Laterality: Left;   AV FISTULA PLACEMENT Right 10/26/2021   Procedure: INSERTION OF RIGHT ARM ARTERIOVENOUS (AV) GORE-TEX GRAFT;  Surgeon: Maeola Harman, MD;  Location: [facility] OR;  Service: Vascular;  Laterality: Right;   CAPD INSERTION  08/25/2008   open, Dr Zachery Dakins   CAPD INSERTION N/A 12/14/2013   Procedure: LAPAROSCOPIC INSERTION CONTINUOUS AMBULATORY PERITONEAL DIALYSIS  (CAPD) CATHETER;  Surgeon: Ardeth Sportsman, MD;  [facility];  Service: General;  Laterality: N/A;   CAPD REMOVAL  03/22/2010   Dr Zachery Dakins   COLONOSCOPY     ESOPHAGOGASTRODUODENOSCOPY     EYE SURGERY Bilateral    radial keratotomy   Hemodialysis catheter Right 02/13/2018   INSERTION OF MESH N/A 12/14/2013   Procedure: INSERTION OF MESH;  Surgeon: Ardeth Sportsman, MD;  [facility] OR;  Service: General;  Laterality: N/A;   KIDNEY TRANSPLANT Right 01/2010   [facility]   LAPAROSCOPIC LYSIS OF ADHESIONS N/A 12/14/2013   Procedure: LAPAROSCOPIC LYSIS OF ADHESIONS;  Surgeon: Ardeth Sportsman, MD;  [facility];  Service: General;  Laterality: N/A;   PATCH ANGIOPLASTY  06/29/2021   Procedure: PATCH ANGIOPLASTY;  Surgeon: Leonie Douglas, MD;  Location[facility] OR;  Service: Vascular;;   THROMBECTOMY AND REVISION OF ARTERIOVENTOUS (AV) GORETEX  GRAFT Left 06/29/2021   Procedure: REVISION OF LEFT UPPER EXTREMITY ARTERIOVENOUS GORETEX GRAFT WITH THROMBECTOMY;  Surgeon: Leonie Douglas, MD;  Location[facility];  Service: Vascular;  Laterality: Left;  PERIPHERAL NERVE  BLOCK   TOTAL HIP ARTHROPLASTY Left 02/06/2019   Procedure: TOTAL HIP ARTHROPLASTY ANTERIOR APPROACH;  Surgeon: Samson Frederic, MD;  [facility] OR;  Service: Orthopedics;  Laterality: Left;   UMBILICAL HERNIA REPAIR  10/25/2009   open with mesh,  Dr Zachery Dakins   URETER REVISION  04/2011   [facility]   VENTRAL HERNIA REPAIR N/A 12/14/2013   Procedure: LAPAROSCOPIC VENTRAL HERNIA;  Surgeon: Ardeth Sportsman, MD;  [facility];  Service: General;  Laterality: N/A;   VIDEO ASSISTED THORACOSCOPY (VATS)/WEDGE RESECTION Left 09/01/2014   Procedure: VIDEO ASSISTED THORACOSCOPY (VATS)/WEDGE RESECTION;  Surgeon: Loreli Slot, MD;  Location[facility] OR;  Service: Thoracic;  Laterality: Left;   VIDEO BRONCHOSCOPY Bilateral 07/05/2014   Procedure: VIDEO BRONCHOSCOPY WITH FLUORO;  Surgeon: Nyoka Cowden, MD;  Location: [facility];  Service: Cardiopulmonary;  Laterality: Bilateral;    MEDICATIONS: No current facility-administered medications for this encounter.    albuterol (VENTOLIN HFA) 108 (90 Base) MCG/ACT inhaler   amLODipine (NORVASC) 10 MG tablet   budesonide-formoterol (SYMBICORT) 160-4.5 MCG/ACT inhaler   cinacalcet (SENSIPAR) 30 MG tablet   EPOETIN ALFA IJ  Garlic 1000 MG CAPS   hydroxychloroquine (PLAQUENIL) 200 MG tablet   levothyroxine (SYNTHROID, LEVOTHROID) 50 MCG tablet   losartan (COZAAR) 100 MG tablet   Multiple Vitamins-Minerals (ZINC PO)   OVER THE COUNTER MEDICATION   predniSONE (DELTASONE) 5 MG tablet   Probiotic Product (PROBIOTIC DAILY PO)   PSYLLIUM PO   SEVELAMER HCL PO   tiotropium (SPIRIVA) 18 MCG inhalation capsule    Shonna Chock, PA-C Surgical Short Stay/Anesthesiology [facility] Phone [phone] [facility] Phone [phone] 03/25/2023 1:41 PM

## 2023-03-27 DIAGNOSIS — N186 End stage renal disease: Secondary | ICD-10-CM | POA: Diagnosis not present

## 2023-03-27 DIAGNOSIS — D631 Anemia in chronic kidney disease: Secondary | ICD-10-CM | POA: Diagnosis not present

## 2023-03-27 DIAGNOSIS — D689 Coagulation defect, unspecified: Secondary | ICD-10-CM | POA: Diagnosis not present

## 2023-03-27 DIAGNOSIS — E039 Hypothyroidism, unspecified: Secondary | ICD-10-CM | POA: Diagnosis not present

## 2023-03-27 DIAGNOSIS — E1129 Type 2 diabetes mellitus with other diabetic kidney complication: Secondary | ICD-10-CM | POA: Diagnosis not present

## 2023-03-27 DIAGNOSIS — R519 Headache, unspecified: Secondary | ICD-10-CM | POA: Diagnosis not present

## 2023-03-27 DIAGNOSIS — Z992 Dependence on renal dialysis: Secondary | ICD-10-CM | POA: Diagnosis not present

## 2023-03-27 DIAGNOSIS — N2581 Secondary hyperparathyroidism of renal origin: Secondary | ICD-10-CM | POA: Diagnosis not present

## 2023-03-28 ENCOUNTER — Encounter (HOSPITAL_COMMUNITY): Payer: Self-pay | Admitting: Vascular Surgery

## 2023-03-28 ENCOUNTER — Ambulatory Visit (HOSPITAL_COMMUNITY): Payer: Medicare Other | Admitting: Vascular Surgery

## 2023-03-28 ENCOUNTER — Other Ambulatory Visit: Payer: Self-pay

## 2023-03-28 ENCOUNTER — Ambulatory Visit (HOSPITAL_BASED_OUTPATIENT_CLINIC_OR_DEPARTMENT_OTHER): Payer: Medicare Other | Admitting: Vascular Surgery

## 2023-03-28 ENCOUNTER — Encounter (HOSPITAL_COMMUNITY)
Admission: RE | Disposition: A | Discharge status: Home / Self Care | Payer: Self-pay | Source: Home / Self Care | Attending: Vascular Surgery

## 2023-03-28 ENCOUNTER — Ambulatory Visit (HOSPITAL_COMMUNITY)
Admission: RE | Admit: 2023-03-28 | Discharge: 2023-03-28 | Disposition: A | Discharge status: Home / Self Care | Payer: Medicare Other | Source: Home / Self Care | Attending: Vascular Surgery | Admitting: Vascular Surgery

## 2023-03-28 DIAGNOSIS — Z940 Kidney transplant status: Secondary | ICD-10-CM | POA: Insufficient documentation

## 2023-03-28 DIAGNOSIS — Z992 Dependence on renal dialysis: Secondary | ICD-10-CM | POA: Insufficient documentation

## 2023-03-28 DIAGNOSIS — N186 End stage renal disease: Secondary | ICD-10-CM | POA: Insufficient documentation

## 2023-03-28 DIAGNOSIS — E1122 Type 2 diabetes mellitus with diabetic chronic kidney disease: Secondary | ICD-10-CM

## 2023-03-28 DIAGNOSIS — M1990 Unspecified osteoarthritis, unspecified site: Secondary | ICD-10-CM | POA: Insufficient documentation

## 2023-03-28 DIAGNOSIS — X58XXXA Exposure to other specified factors, initial encounter: Secondary | ICD-10-CM | POA: Insufficient documentation

## 2023-03-28 DIAGNOSIS — I120 Hypertensive chronic kidney disease with stage 5 chronic kidney disease or end stage renal disease: Secondary | ICD-10-CM | POA: Insufficient documentation

## 2023-03-28 DIAGNOSIS — G4730 Sleep apnea, unspecified: Secondary | ICD-10-CM | POA: Insufficient documentation

## 2023-03-28 DIAGNOSIS — T827XXA Infection and inflammatory reaction due to other cardiac and vascular devices, implants and grafts, initial encounter: Secondary | ICD-10-CM | POA: Diagnosis not present

## 2023-03-28 DIAGNOSIS — I2510 Atherosclerotic heart disease of native coronary artery without angina pectoris: Secondary | ICD-10-CM | POA: Diagnosis not present

## 2023-03-28 DIAGNOSIS — T82898A Other specified complication of vascular prosthetic devices, implants and grafts, initial encounter: Secondary | ICD-10-CM

## 2023-03-28 DIAGNOSIS — J90 Pleural effusion, not elsewhere classified: Secondary | ICD-10-CM | POA: Diagnosis not present

## 2023-03-28 DIAGNOSIS — Z7951 Long term (current) use of inhaled steroids: Secondary | ICD-10-CM | POA: Diagnosis not present

## 2023-03-28 DIAGNOSIS — I471 Supraventricular tachycardia, unspecified: Secondary | ICD-10-CM | POA: Diagnosis not present

## 2023-03-28 DIAGNOSIS — Z8673 Personal history of transient ischemic attack (TIA), and cerebral infarction without residual deficits: Secondary | ICD-10-CM | POA: Insufficient documentation

## 2023-03-28 DIAGNOSIS — K449 Diaphragmatic hernia without obstruction or gangrene: Secondary | ICD-10-CM | POA: Insufficient documentation

## 2023-03-28 DIAGNOSIS — I700 Atherosclerosis of aorta: Secondary | ICD-10-CM | POA: Diagnosis not present

## 2023-03-28 DIAGNOSIS — I358 Other nonrheumatic aortic valve disorders: Secondary | ICD-10-CM | POA: Insufficient documentation

## 2023-03-28 DIAGNOSIS — K219 Gastro-esophageal reflux disease without esophagitis: Secondary | ICD-10-CM | POA: Diagnosis not present

## 2023-03-28 DIAGNOSIS — J45909 Unspecified asthma, uncomplicated: Secondary | ICD-10-CM | POA: Diagnosis not present

## 2023-03-28 DIAGNOSIS — D631 Anemia in chronic kidney disease: Secondary | ICD-10-CM | POA: Diagnosis not present

## 2023-03-28 DIAGNOSIS — T827XXD Infection and inflammatory reaction due to other cardiac and vascular devices, implants and grafts, subsequent encounter: Secondary | ICD-10-CM

## 2023-03-28 HISTORY — PX: PATCH ANGIOPLASTY: SHX6230

## 2023-03-28 HISTORY — PX: AVGG REMOVAL: SHX5153

## 2023-03-28 LAB — POCT I-STAT, CHEM 8
BUN: 33 mg/dL — ABNORMAL HIGH (ref 8–23)
Calcium, Ion: 1.03 mmol/L — ABNORMAL LOW (ref 1.15–1.40)
Chloride: 96 mmol/L — ABNORMAL LOW (ref 98–111)
Creatinine, Ser: 5.4 mg/dL — ABNORMAL HIGH (ref 0.44–1.00)
Glucose, Bld: 86 mg/dL (ref 70–99)
HCT: 30 % — ABNORMAL LOW (ref 36.0–46.0)
Hemoglobin: 10.2 g/dL — ABNORMAL LOW (ref 12.0–15.0)
Potassium: 4.5 mmol/L (ref 3.5–5.1)
Sodium: 135 mmol/L (ref 135–145)
TCO2: 32 mmol/L (ref 22–32)

## 2023-03-28 SURGERY — REMOVAL OF ARTERIOVENOUS GORETEX GRAFT (AVGG)
Anesthesia: General | Laterality: Left

## 2023-03-28 MED ORDER — CHLORHEXIDINE GLUCONATE 4 % EX SOLN: 60.0000 mL | Freq: Once | CUTANEOUS | Status: DC

## 2023-03-28 MED ORDER — CEFAZOLIN SODIUM-DEXTROSE 2-4 GM/100ML-% IV SOLN
2.0000 g | INTRAVENOUS | Status: AC
  Administered 2023-03-28: 2 g via INTRAVENOUS
  Filled 2023-03-28: qty 100

## 2023-03-28 MED ORDER — SODIUM CHLORIDE 0.9 % IV SOLN: INTRAVENOUS | Status: DC

## 2023-03-28 MED ORDER — ONDANSETRON HCL 4 MG/2ML IJ SOLN
INTRAMUSCULAR | Status: AC
  Filled 2023-03-28: qty 2

## 2023-03-28 MED ORDER — EPHEDRINE SULFATE (PRESSORS) 50 MG/ML IJ SOLN
INTRAMUSCULAR | Status: DC | PRN
  Administered 2023-03-28: 10 mg via INTRAVENOUS

## 2023-03-28 MED ORDER — 0.9 % SODIUM CHLORIDE (POUR BTL) OPTIME
TOPICAL | Status: DC | PRN
  Administered 2023-03-28: 1000 mL

## 2023-03-28 MED ORDER — DEXAMETHASONE SODIUM PHOSPHATE 10 MG/ML IJ SOLN
INTRAMUSCULAR | Status: DC | PRN
  Administered 2023-03-28: 5 mg via INTRAVENOUS

## 2023-03-28 MED ORDER — DEXAMETHASONE SODIUM PHOSPHATE 10 MG/ML IJ SOLN
INTRAMUSCULAR | Status: AC
  Filled 2023-03-28: qty 1

## 2023-03-28 MED ORDER — HEPARIN 6000 UNIT IRRIGATION SOLUTION
Status: AC
  Filled 2023-03-28: qty 500

## 2023-03-28 MED ORDER — LIDOCAINE 2% (20 MG/ML) 5 ML SYRINGE
INTRAMUSCULAR | Status: DC | PRN
  Administered 2023-03-28: 60 mg via INTRAVENOUS

## 2023-03-28 MED ORDER — SODIUM CHLORIDE 0.9 % IV SOLN: INTRAVENOUS | Status: DC | PRN

## 2023-03-28 MED ORDER — MIDAZOLAM HCL 2 MG/2ML IJ SOLN
INTRAMUSCULAR | Status: DC | PRN
  Administered 2023-03-28: 2 mg via INTRAVENOUS

## 2023-03-28 MED ORDER — MIDAZOLAM HCL 2 MG/2ML IJ SOLN
INTRAMUSCULAR | Status: AC
  Filled 2023-03-28: qty 2

## 2023-03-28 MED ORDER — BUPIVACAINE LIPOSOME 1.3 % IJ SUSP
INTRAMUSCULAR | Status: AC
  Filled 2023-03-28: qty 20

## 2023-03-28 MED ORDER — OXYCODONE HCL 5 MG PO TABS
5.0000 mg | ORAL_TABLET | Freq: Once | ORAL | Status: AC | PRN
  Administered 2023-03-28: 5 mg via ORAL

## 2023-03-28 MED ORDER — BUPIVACAINE HCL (PF) 0.5 % IJ SOLN
INTRAMUSCULAR | Status: AC
  Filled 2023-03-28: qty 30

## 2023-03-28 MED ORDER — ACETAMINOPHEN 500 MG PO TABS
1000.0000 mg | ORAL_TABLET | Freq: Once | ORAL | Status: AC
  Administered 2023-03-28: 1000 mg via ORAL
  Filled 2023-03-28: qty 2

## 2023-03-28 MED ORDER — PROMETHAZINE HCL 25 MG/ML IJ SOLN: 6.2500 mg | INTRAMUSCULAR | Status: DC | PRN

## 2023-03-28 MED ORDER — PENTAFLUOROPROP-TETRAFLUOROETH EX AERO
INHALATION_SPRAY | CUTANEOUS | Status: AC
  Filled 2023-03-28: qty 30

## 2023-03-28 MED ORDER — FENTANYL CITRATE (PF) 100 MCG/2ML IJ SOLN
INTRAMUSCULAR | Status: AC
  Filled 2023-03-28: qty 2

## 2023-03-28 MED ORDER — PROPOFOL 10 MG/ML IV BOLUS
INTRAVENOUS | Status: DC | PRN
  Administered 2023-03-28: 150 mg via INTRAVENOUS

## 2023-03-28 MED ORDER — OXYCODONE HCL 5 MG PO TABS
ORAL_TABLET | ORAL | Status: AC
  Filled 2023-03-28: qty 1

## 2023-03-28 MED ORDER — FENTANYL CITRATE (PF) 250 MCG/5ML IJ SOLN
INTRAMUSCULAR | Status: AC
  Filled 2023-03-28: qty 5

## 2023-03-28 MED ORDER — FENTANYL CITRATE (PF) 250 MCG/5ML IJ SOLN: INTRAMUSCULAR | Status: DC | PRN

## 2023-03-28 MED ORDER — OXYCODONE HCL 5 MG/5ML PO SOLN: 5.0000 mg | Freq: Once | ORAL | Status: AC | PRN

## 2023-03-28 MED ORDER — BUPIVACAINE HCL (PF) 0.5 % IJ SOLN
INTRAMUSCULAR | Status: DC | PRN
  Administered 2023-03-28: 15 mL

## 2023-03-28 MED ORDER — PROPOFOL 10 MG/ML IV BOLUS
INTRAVENOUS | Status: AC
  Filled 2023-03-28: qty 20

## 2023-03-28 MED ORDER — LIDOCAINE 2% (20 MG/ML) 5 ML SYRINGE: INTRAMUSCULAR | Status: DC | PRN

## 2023-03-28 MED ORDER — BUPIVACAINE LIPOSOME 1.3 % IJ SUSP
INTRAMUSCULAR | Status: DC | PRN
  Administered 2023-03-28: 10 mL

## 2023-03-28 MED ORDER — ONDANSETRON HCL 4 MG/2ML IJ SOLN
INTRAMUSCULAR | Status: DC | PRN
  Administered 2023-03-28: 4 mg via INTRAVENOUS

## 2023-03-28 MED ORDER — ORAL CARE MOUTH RINSE
15.0000 mL | Freq: Once | OROMUCOSAL | Status: AC
  Administered 2023-03-28: 15 mL via OROMUCOSAL

## 2023-03-28 MED ORDER — PROPOFOL 500 MG/50ML IV EMUL
INTRAVENOUS | Status: DC | PRN
  Administered 2023-03-28: 150 ug/kg/min via INTRAVENOUS

## 2023-03-28 MED ORDER — CHLORHEXIDINE GLUCONATE 0.12 % MT SOLN: 15.0000 mL | Freq: Once | OROMUCOSAL | Status: AC

## 2023-03-28 MED ORDER — PHENYLEPHRINE HCL-NACL 20-0.9 MG/250ML-% IV SOLN
INTRAVENOUS | Status: DC | PRN
  Administered 2023-03-28: 40 ug/min via INTRAVENOUS

## 2023-03-28 MED ORDER — FENTANYL CITRATE (PF) 100 MCG/2ML IJ SOLN
25.0000 ug | INTRAMUSCULAR | Status: DC | PRN
  Administered 2023-03-28 (×2): 50 ug via INTRAVENOUS

## 2023-03-28 MED ORDER — HEPARIN 6000 UNIT IRRIGATION SOLUTION
Status: DC | PRN
  Administered 2023-03-28: 1

## 2023-03-28 MED ORDER — OXYCODONE-ACETAMINOPHEN 5-325 MG PO TABS: 1.0000 | ORAL_TABLET | Freq: Four times a day (QID) | ORAL | 0 refills | Status: AC | PRN

## 2023-03-28 MED ORDER — FENTANYL CITRATE (PF) 250 MCG/5ML IJ SOLN
INTRAMUSCULAR | Status: DC | PRN
  Administered 2023-03-28: 25 ug via INTRAVENOUS
  Administered 2023-03-28 (×2): 50 ug via INTRAVENOUS
  Administered 2023-03-28: 25 ug via INTRAVENOUS

## 2023-03-28 SURGICAL SUPPLY — 35 items
ADH SKN CLS APL DERMABOND .7 (GAUZE/BANDAGES/DRESSINGS) ×2
ARMBAND PINK RESTRICT EXTREMIT (MISCELLANEOUS) ×4 IMPLANT
BAG COUNTER SPONGE SURGICOUNT (BAG) ×2 IMPLANT
BAG SPNG CNTER NS LX DISP (BAG) ×2
BNDG CMPR 5X4 KNIT ELC UNQ LF (GAUZE/BANDAGES/DRESSINGS) ×2
BNDG ELASTIC 4INX 5YD STR LF (GAUZE/BANDAGES/DRESSINGS) IMPLANT
CANISTER SUCT 3000ML PPV (MISCELLANEOUS) ×2 IMPLANT
CLIP LIGATING EXTRA MED SLVR (CLIP) ×3 IMPLANT
CLIP LIGATING EXTRA SM BLUE (MISCELLANEOUS) ×3 IMPLANT
CNTNR URN SCR LID CUP LEK RST (MISCELLANEOUS) IMPLANT
CONT SPEC 4OZ STRL OR WHT (MISCELLANEOUS) ×2
DERMABOND ADVANCED .7 DNX12 (GAUZE/BANDAGES/DRESSINGS) ×2 IMPLANT
ELECT REM PT RETURN 9FT ADLT (ELECTROSURGICAL) ×2
ELECTRODE REM PT RTRN 9FT ADLT (ELECTROSURGICAL) ×2 IMPLANT
GAUZE SPONGE 4X4 12PLY STRL LF (GAUZE/BANDAGES/DRESSINGS) IMPLANT
GLOVE BIO SURGEON STRL SZ7.5 (GLOVE) ×3 IMPLANT
GOWN STRL REUS W/ TWL LRG LVL3 (GOWN DISPOSABLE) ×4 IMPLANT
GOWN STRL REUS W/ TWL XL LVL3 (GOWN DISPOSABLE) ×2 IMPLANT
GOWN STRL REUS W/TWL LRG LVL3 (GOWN DISPOSABLE) ×4
GOWN STRL REUS W/TWL XL LVL3 (GOWN DISPOSABLE) ×2
KIT BASIN OR (CUSTOM PROCEDURE TRAY) ×2 IMPLANT
KIT TURNOVER KIT B (KITS) ×2 IMPLANT
NS IRRIG 1000ML POUR BTL (IV SOLUTION) ×2 IMPLANT
PACK CV ACCESS (CUSTOM PROCEDURE TRAY) ×2 IMPLANT
PAD ARMBOARD 7.5X6 YLW CONV (MISCELLANEOUS) ×4 IMPLANT
POWDER SURGICEL 3.0 GRAM (HEMOSTASIS) IMPLANT
STAPLER VISISTAT 35W (STAPLE) IMPLANT
SUT MNCRL AB 4-0 PS2 18 (SUTURE) IMPLANT
SUT PROLENE 5 0 C 1 24 (SUTURE) IMPLANT
SUT PROLENE 6 0 BV (SUTURE) ×4 IMPLANT
SUT VIC AB 3-0 SH 27 (SUTURE) ×4
SUT VIC AB 3-0 SH 27X BRD (SUTURE) ×4 IMPLANT
TOWEL GREEN STERILE (TOWEL DISPOSABLE) ×2 IMPLANT
UNDERPAD 30X36 HEAVY ABSORB (UNDERPADS AND DIAPERS) ×2 IMPLANT
WATER STERILE IRR 1000ML POUR (IV SOLUTION) ×2 IMPLANT

## 2023-03-28 NOTE — Discharge Instructions (Addendum)
   [facility]  Discharge Instructions  AV Fistula or Graft Surgery for Dialysis Access  Please refer to the following instructions for your post-procedure care. Your surgeon or physician assistant will discuss any changes with you.  Activity  You may drive the day following your surgery, if you are comfortable and no longer taking prescription pain medication. Resume full activity as the soreness in your incision resolves.  Bathing/Showering  You may shower after you go home. Keep your incision dry for 48 hours. Do not soak in a bathtub, hot tub, or swim until the incision heals completely. You may not shower if you have a hemodialysis catheter.  Incision Care  Clean your incision with mild soap and water after 48 hours. Pat the area dry with a clean towel. You do not need a bandage unless otherwise instructed. Do not apply any ointments or creams to your incision. You may have skin glue on your incision. Do not peel it off. It will come off on its own in about one week. Your arm may swell a bit after surgery. To reduce swelling use pillows to elevate your arm so it is above your heart. Your doctor will tell you if you need to lightly wrap your arm with an ACE bandage.  Diet  Resume your normal diet. There are not special food restrictions following this procedure. In order to heal from your surgery, it is CRITICAL to get adequate nutrition. Your body requires vitamins, minerals, and protein. Vegetables are the best source of vitamins and minerals. Vegetables also provide the perfect balance of protein. Processed food has little nutritional value, so try to avoid this.  Medications  Resume taking all of your medications. If your incision is causing pain, you may take over-the counter pain relievers such as acetaminophen (Tylenol). If you were prescribed a stronger pain medication, please be aware these medications can cause nausea and constipation. Prevent  nausea by taking the medication with a snack or meal. Avoid constipation by drinking plenty of fluids and eating foods with high amount of fiber, such as fruits, vegetables, and grains.  Do not take Tylenol if you are taking prescription pain medications.  Follow up Your surgeon may want to see you in the office following your access surgery. If so, this will be arranged at the time of your surgery.  Please call us immediately for any of the following conditions:  Increased pain, redness, drainage (pus) from your incision site Fever of 101 degrees or higher Severe or worsening pain at your incision site Hand pain or numbness.  Reduce your risk of vascular disease:  Stop smoking. If you would like help, call [facility] at 1-800-QUIT-NOW (903 711 7165) or [place] at [phone]  Manage your cholesterol Maintain a desired weight Control your diabetes Keep your blood pressure down  Dialysis  It will take several weeks to several months for your new dialysis access to be ready for use. Your surgeon will determine when it is okay to use it. Your nephrologist will continue to direct your dialysis. You can continue to use your Permcath until your new access is ready for use.   03/28/2023 Kimberly [name] [id number] September 20, 1956  Surgeon(s): Maeola Harman, MD  Procedure(s): LEFT ARM ARTERIOVENOUS GORETEX GRAFT EXCISION VEIN PATCH ANGIOPLASTY  Continue to use functioning right upper extremity AV graft for dialysis   If you have any questions, please call the office at [phone].

## 2023-03-28 NOTE — Transfer of Care (Signed)
Immediate Anesthesia Transfer of Care Note  Patient: [name]  Procedure(s) Performed: LEFT ARM ARTERIOVENOUS GORETEX GRAFT EXCISION (Left) VEIN PATCH ANGIOPLASTY  Patient Location: PACU  Anesthesia Type:General  Level of Consciousness: awake, alert , and oriented  Airway & Oxygen Therapy: Patient Spontanous Breathing  Post-op Assessment: Post -op Vital signs reviewed and stable  Post vital signs: stable  Last Vitals:  Vitals Value Taken Time  BP    Temp 37 C 03/28/23 1030  Pulse 65 03/28/23 1033  Resp 15 03/28/23 1033  SpO2 99 % 03/28/23 1033  Vitals shown include unvalidated device data.  Last Pain:  Vitals:   03/28/23 0736  TempSrc: Oral  PainSc: 0-No pain         Complications: No notable events documented.

## 2023-03-28 NOTE — Anesthesia Procedure Notes (Signed)
Procedure Name: LMA Insertion Date/Time: 03/28/2023 9:39 AM  Performed by: Dorie Rank, CRNAPre-anesthesia Checklist: Patient identified, Emergency Drugs available, Suction available, Patient being monitored and Timeout performed Patient Re-evaluated:Patient Re-evaluated prior to induction Oxygen Delivery Method: Circle system utilized Preoxygenation: Pre-oxygenation with 100% oxygen Induction Type: IV induction Ventilation: Mask ventilation without difficulty LMA: LMA inserted LMA Size: 4.0 Placement Confirmation: breath sounds checked- equal and bilateral and positive ETCO2 Tube secured with: Tape Dental Injury: Teeth and Oropharynx as per pre-operative assessment

## 2023-03-28 NOTE — Op Note (Signed)
Patient name: Kimberly [name]: [id number] DOB: 05-31-1956 Sex: female  03/28/2023 Pre-operative Diagnosis: End-stage renal disease, infected left upper arm AV graft Post-operative diagnosis:  Same Surgeon:  Luanna Salk. Randie Heinz, MD Assistant: Nathanial Rancher, PA Procedure Performed: Excision of left upper arm AV graft with harvest of left brachial vein and vein patch angioplasty left brachial artery  Indications: 67 year old female currently on dialysis via right upper arm AV graft.  More remotely she underwent thrombectomy of the left arm AV graft with patch angioplasty of the outflow and subsequently developed a draining sinus tract from the graft which was also thrombosed.  We have discussed excision and the risk and benefits and she demonstrates good understanding and agrees to proceed.  Experience assistant was necessary to facilitate exposure of the graft as well as assist with anastomosis of vein patch angioplasty of the brachial artery.  Findings: Graft did have purulence draining from 1 very small site in the mid upper arm.  The rest of the graft was well incorporated.  The entirety of the graft was removed.  The venous anastomosis which also incorporated and previous vein patch angioplasty was oversewn with a running 5-0 Prolene suture and the arterial anastomosis was repaired with vein patch angioplasty using harvested brachial vein.  After completion of the strong palpable radial pulse at the wrist and staples in place in the skin due to the presence of infection.   Procedure:  The patient was identified in the holding area and taken to the operating room where she was placed supine operative table.  LMA anesthesia was induced.  She was sterilely prepped and draped in the left upper extremity usual fashion, antibiotics were administered and timeout was called.  Began with incision at the previous site at the antecubitum.  I dissected down identified the graft encircled this with vessel loop  and it encircled the artery proximally and distally.  Through the same incision there was a brachial vein overlying the graft which was harvested for approximately 2 cm tied off on both sides.  There was a separate much larger brachial vein which maintained inline venous flow in her upper arm.  An incision was then made in the axilla at the site of her previous incision.  We dissected down easily identified the graft.  There was dense scar tissue and evidence of previous vein patch angioplasty.  Graft itself was encircled with a vessel loop and I dissected back to the level of the vein and then clamped the vein proximally and distally and excised the graft entirely oversewed the venous anastomosis with 5-0 Prolene suture in a running mattress fashion.  Attention was then turned back to the antecubital space.  The artery was clamped distally and proximally and the graft was entirely excised.  At the arteriotomy I flushed heparinized saline in both directions.  The previously harvested brachial vein was then spatulated and sewn in place as a patch angioplasty with 6-0 Prolene suture.  Prior to completion we allowed flushing in all directions.  Completion there remained a very strong pulse in the wrist at the radial artery.  We then performed an elliptical type incision around the area of the graft that was draining purulence.  We dissected down to the graft.  The entirety of the graft was removed from all 3 incisions and the graft was totally intact there is no further graft left knee incisions.  The wound was then thoroughly irrigated and hemostasis was obtained.  I elected to close  the previous venous and arterial anastomosis incisions with Vicryl in the subcutaneous tissue and then placed staples and staples were placed into elliptical incision and the graft was excised.  Sterile dressing was then applied including Ace wrap.  Patient was then awakened from anesthesia having tolerated the procedure without any  complication.  All counts were correct at completion.  EBL: 50 cc    Astoria Condon C. Randie Heinz, MD [facility] Office: [phone] Pager: [phone]

## 2023-03-28 NOTE — H&P (Signed)
HPI:   Kimberly [name] is a 67 y.o. female With history of bilateral upper extremity AV graft.  Most recently she has had a right arm AV graft which is functioning.  She has had a draining sinus from the left arm AV graft this has been cultured it was negative she is also been placed on antibiotics.  We are now planning removal of left arm avg.       Past Medical History:  Diagnosis Date   Anemia     Arthritis     Asthma     Complication of anesthesia      Slow to awaken and AMS- took 1 month to memory and recall to return   COVID     Diverticulosis     First degree AV block     GERD (gastroesophageal reflux disease)     History of blood transfusion     History of chronic cough     History of hiatal hernia     History of hoarseness     Hypertension      history; resolved on dialysis   Hypothyroidism     IgA nephropathy      ESRD - Hemo TTH Sat   Premature atrial beat     Premature ventricular beat     Renal transplant failure and rejection 2012   Shingles 03/2011   Sleep apnea      uses a mouth guard   Stroke [facility]) October 2010, March 2013    cerebral aneurysm, 07/2017         Family History  Problem Relation Age of Onset   Coronary artery disease Mother     Emphysema Mother          smoked   Ovarian cancer Mother     Liver disease Father     Stroke Maternal Uncle     Diabetes Maternal Uncle     Kidney disease Maternal Uncle           Past Surgical History:  Procedure Laterality Date   AV FISTULA PLACEMENT Left 05/20/2018    Procedure: INSERTION OF ARTERIOVENOUS (AV) GORE-TEX GRAFT LEFT  ARM;  Surgeon: Maeola Harman, MD;  Location: [facility] OR;  Service: Vascular;  Laterality: Left;   AV FISTULA PLACEMENT Right 10/26/2021    Procedure: INSERTION OF RIGHT ARM ARTERIOVENOUS (AV) GORE-TEX GRAFT;  Surgeon: Maeola Harman, MD;  Location: [facility] OR;  Service: Vascular;  Laterality: Right;   CAPD INSERTION   08/25/2008    open, Dr Zachery Dakins   CAPD  INSERTION N/A 12/14/2013    Procedure: LAPAROSCOPIC INSERTION CONTINUOUS AMBULATORY PERITONEAL DIALYSIS  (CAPD) CATHETER;  Surgeon: Ardeth Sportsman, MD;  Location: [facility] OR;  Service: General;  Laterality: N/A;   CAPD REMOVAL   03/22/2010    Dr Zachery Dakins   COLONOSCOPY       ESOPHAGOGASTRODUODENOSCOPY       EYE SURGERY Bilateral      radial keratotomy   Hemodialysis catheter Right 02/13/2018   INSERTION OF MESH N/A 12/14/2013    Procedure: INSERTION OF MESH;  Surgeon: Ardeth Sportsman, MD;  Location: [facility] OR;  Service: General;  Laterality: N/A;   KIDNEY TRANSPLANT Right 01/2010    [facility]   LAPAROSCOPIC LYSIS OF ADHESIONS N/A 12/14/2013    Procedure: LAPAROSCOPIC LYSIS OF ADHESIONS;  Surgeon: Ardeth Sportsman, MD;  Location: [facility] OR;  Service: General;  Laterality: N/A;   PATCH ANGIOPLASTY   06/29/2021    Procedure:  PATCH ANGIOPLASTY;  Surgeon: Leonie Douglas, MD;  Location: [facility] OR;  Service: Vascular;;   THROMBECTOMY AND REVISION OF ARTERIOVENTOUS (AV) GORETEX  GRAFT Left 06/29/2021    Procedure: REVISION OF LEFT UPPER EXTREMITY ARTERIOVENOUS GORETEX GRAFT WITH THROMBECTOMY;  Surgeon: Leonie Douglas, MD;  Location: [facility] OR;  Service: Vascular;  Laterality: Left;  PERIPHERAL NERVE BLOCK   TOTAL HIP ARTHROPLASTY Left 02/06/2019    Procedure: TOTAL HIP ARTHROPLASTY ANTERIOR APPROACH;  Surgeon: Samson Frederic, MD;  Location: [facility] OR;  Service: Orthopedics;  Laterality: Left;   UMBILICAL HERNIA REPAIR   10/25/2009    open with mesh,  Dr Zachery Dakins   URETER REVISION   04/2011    [facility]   VENTRAL HERNIA REPAIR N/A 12/14/2013    Procedure: LAPAROSCOPIC VENTRAL HERNIA;  Surgeon: Ardeth Sportsman, MD;  Location: [facility] OR;  Service: General;  Laterality: N/A;   VIDEO ASSISTED THORACOSCOPY (VATS)/WEDGE RESECTION Left 09/01/2014    Procedure: VIDEO ASSISTED THORACOSCOPY (VATS)/WEDGE RESECTION;  Surgeon: Loreli Slot, MD;  Location: [facility] OR;  Service: Thoracic;  Laterality: Left;   VIDEO BRONCHOSCOPY Bilateral 07/05/2014     Procedure: VIDEO BRONCHOSCOPY WITH FLUORO;  Surgeon: Nyoka Cowden, MD;  Location: [facility];  Service: Cardiopulmonary;  Laterality: Bilateral;      Short Social History:  Social History         Tobacco Use   Smoking status: Never      Passive exposure: Never   Smokeless tobacco: Never  Substance Use Topics   Alcohol use: Yes      Comment: ocassional- not weekly           Allergies  Allergen Reactions   Mircera [Methoxy Polyethylene Glycol-Epoetin Beta] Other (See Comments)      Severe joint and muscle pain   Mycophenolate Mofetil Diarrhea   Dapsone Rash and Other (See Comments)      Pain in feet   Pregabalin Rash and Other (See Comments)      Fever - reaction to Lyrica   Sulfonamide Derivatives Rash   Tramadol Rash and Other (See Comments)      Rash on cheek            Current Outpatient Medications  Medication Sig Dispense Refill   albuterol (VENTOLIN HFA) 108 (90 Base) MCG/ACT inhaler Inhale 2 puffs into the lungs every 6 (six) hours as needed for shortness of breath or wheezing (Asthma).       amLODipine (NORVASC) 10 MG tablet Take 10 mg by mouth daily.       budesonide-formoterol (SYMBICORT) 160-4.5 MCG/ACT inhaler Inhale 2 puffs into the lungs 2 (two) times daily.       cinacalcet (SENSIPAR) 30 MG tablet Take 30 mg by mouth daily after supper.       EPOETIN ALFA IJ Inject into the skin See admin instructions. Inject  subcutaneously at dialysis as needed for low hemoglobin       Garlic 1000 MG CAPS Take 1,000 mg by mouth in the morning, at noon, and at bedtime.       hydroxychloroquine (PLAQUENIL) 200 MG tablet Take 200 mg by mouth daily.       levothyroxine (SYNTHROID, LEVOTHROID) 50 MCG tablet Take 50-75 mcg by mouth See admin instructions. Take  1 1/2 tablets (75 mcg) by mouth daily on Tuesday and Thursday nights, take 1 tablet (50 mcg) by mouth daily on all other nights of the week       losartan (COZAAR) 100 MG tablet Take  100 mg by mouth daily.        Multiple Vitamins-Minerals (ZINC PO) Take 22 mg by mouth daily.       OVER THE COUNTER MEDICATION Take 4 capsules by mouth 2 (two) times a day. Juice plus - 1 fruit, 1 vegetable, 1 berries, 1 omega - twice daily       predniSONE (DELTASONE) 5 MG tablet Take 5 mg by mouth daily with breakfast.       Probiotic Product (PROBIOTIC DAILY PO) Take 1 tablet by mouth daily.       PSYLLIUM PO Take 3 capsules by mouth daily at 2 PM.        SEVELAMER HCL PO Take 2 tablets by mouth in the morning and at bedtime.       tiotropium (SPIRIVA) 18 MCG inhalation capsule Place 18 mcg into inhaler and inhale daily.        No current facility-administered medications for this visit.      Review of Systems  Constitutional:  Constitutional negative. HENT: HENT negative.  Eyes: Eyes negative.  Respiratory: Respiratory negative.  Cardiovascular: Cardiovascular negative.  GI: Gastrointestinal negative.  Musculoskeletal:       Draining sinus left arm Skin: Skin negative.  Neurological: Neurological negative. Hematologic: Hematologic/lymphatic negative.  Psychiatric: Psychiatric negative.          Objective:    There were no vitals filed for this visit.    Physical Exam HENT:     Head: Normocephalic.     Nose: Nose normal.  Eyes:     Pupils: Pupils are equal, round, and reactive to light.  Cardiovascular:     Rate and Rhythm: Normal rate.  Abdominal:     General: Abdomen is flat.  Musculoskeletal:        General: Normal range of motion.     Cervical back: Normal range of motion and neck supple.     Comments: Small draining sinus left upper arm communicates with graft  Skin:    Capillary Refill: Capillary refill takes less than 2 seconds.  Neurological:     Mental Status: She is alert.  Psychiatric:        Mood and Affect: Mood normal.        Data: No studies      Assessment/Plan:    67 year old female with end-stage renal disease on dialysis via right arm AV graft with draining  sinus in the left arm AV graft.  She has elected for left arm graft excision today in the OR.   Zahari Xiang C. Randie Heinz, MD [facility] [place] Office: [phone] Pager: [phone]

## 2023-03-29 ENCOUNTER — Encounter (HOSPITAL_COMMUNITY): Payer: Self-pay | Admitting: Vascular Surgery

## 2023-03-29 DIAGNOSIS — R519 Headache, unspecified: Secondary | ICD-10-CM | POA: Diagnosis not present

## 2023-03-29 DIAGNOSIS — N186 End stage renal disease: Secondary | ICD-10-CM | POA: Diagnosis not present

## 2023-03-29 DIAGNOSIS — Z992 Dependence on renal dialysis: Secondary | ICD-10-CM | POA: Diagnosis not present

## 2023-03-29 DIAGNOSIS — D631 Anemia in chronic kidney disease: Secondary | ICD-10-CM | POA: Diagnosis not present

## 2023-03-29 DIAGNOSIS — D689 Coagulation defect, unspecified: Secondary | ICD-10-CM | POA: Diagnosis not present

## 2023-03-29 DIAGNOSIS — E039 Hypothyroidism, unspecified: Secondary | ICD-10-CM | POA: Diagnosis not present

## 2023-03-29 DIAGNOSIS — N2581 Secondary hyperparathyroidism of renal origin: Secondary | ICD-10-CM | POA: Diagnosis not present

## 2023-03-29 DIAGNOSIS — E1129 Type 2 diabetes mellitus with other diabetic kidney complication: Secondary | ICD-10-CM | POA: Diagnosis not present

## 2023-03-29 NOTE — Anesthesia Postprocedure Evaluation (Signed)
Anesthesia Post Note  Patient: Kimberly [name]  Procedure(s) Performed: LEFT ARM ARTERIOVENOUS GORETEX GRAFT EXCISION (Left) VEIN PATCH ANGIOPLASTY     Patient location during evaluation: PACU Anesthesia Type: General Level of consciousness: awake and alert Pain management: pain level controlled Vital Signs Assessment: post-procedure vital signs reviewed and stable Respiratory status: spontaneous breathing, nonlabored ventilation, respiratory function stable and patient connected to nasal cannula oxygen Cardiovascular status: blood pressure returned to baseline and stable Postop Assessment: no apparent nausea or vomiting Anesthetic complications: no   No notable events documented.  Last Vitals:  Vitals:   03/28/23 1115 03/28/23 1130  BP: (!) 147/53 (!) 154/65  Pulse: (!) 50 (!) 54  Resp: 10 17  Temp:  37 C  SpO2: 97% 97%    Last Pain:  Vitals:   03/28/23 0736  TempSrc: Oral  PainSc: 0-No pain                 Kennieth Rad

## 2023-03-31 ENCOUNTER — Telehealth: Payer: Self-pay | Admitting: Physician Assistant

## 2023-03-31 NOTE — Telephone Encounter (Signed)
-----   Message from Graceann Congress, [place] sent at 03/28/2023 10:35 AM EDT ----- S/p excision of left upper extremity AV graft by Dr. Randie Heinz. She needs follow up in 2-3 weeks for staple removal. Please arrange on day Dr. Randie Heinz is in the office. thanks

## 2023-04-01 DIAGNOSIS — N186 End stage renal disease: Secondary | ICD-10-CM | POA: Diagnosis not present

## 2023-04-01 DIAGNOSIS — D631 Anemia in chronic kidney disease: Secondary | ICD-10-CM | POA: Diagnosis not present

## 2023-04-01 DIAGNOSIS — Z992 Dependence on renal dialysis: Secondary | ICD-10-CM | POA: Diagnosis not present

## 2023-04-01 DIAGNOSIS — R519 Headache, unspecified: Secondary | ICD-10-CM | POA: Diagnosis not present

## 2023-04-01 DIAGNOSIS — D689 Coagulation defect, unspecified: Secondary | ICD-10-CM | POA: Diagnosis not present

## 2023-04-01 DIAGNOSIS — E039 Hypothyroidism, unspecified: Secondary | ICD-10-CM | POA: Diagnosis not present

## 2023-04-01 DIAGNOSIS — N2581 Secondary hyperparathyroidism of renal origin: Secondary | ICD-10-CM | POA: Diagnosis not present

## 2023-04-01 DIAGNOSIS — E1129 Type 2 diabetes mellitus with other diabetic kidney complication: Secondary | ICD-10-CM | POA: Diagnosis not present

## 2023-04-03 DIAGNOSIS — D631 Anemia in chronic kidney disease: Secondary | ICD-10-CM | POA: Diagnosis not present

## 2023-04-03 DIAGNOSIS — D689 Coagulation defect, unspecified: Secondary | ICD-10-CM | POA: Diagnosis not present

## 2023-04-03 DIAGNOSIS — E039 Hypothyroidism, unspecified: Secondary | ICD-10-CM | POA: Diagnosis not present

## 2023-04-03 DIAGNOSIS — E1129 Type 2 diabetes mellitus with other diabetic kidney complication: Secondary | ICD-10-CM | POA: Diagnosis not present

## 2023-04-03 DIAGNOSIS — N186 End stage renal disease: Secondary | ICD-10-CM | POA: Diagnosis not present

## 2023-04-03 DIAGNOSIS — N2581 Secondary hyperparathyroidism of renal origin: Secondary | ICD-10-CM | POA: Diagnosis not present

## 2023-04-03 DIAGNOSIS — R519 Headache, unspecified: Secondary | ICD-10-CM | POA: Diagnosis not present

## 2023-04-03 DIAGNOSIS — Z992 Dependence on renal dialysis: Secondary | ICD-10-CM | POA: Diagnosis not present

## 2023-04-05 DIAGNOSIS — E1129 Type 2 diabetes mellitus with other diabetic kidney complication: Secondary | ICD-10-CM | POA: Diagnosis not present

## 2023-04-05 DIAGNOSIS — Z992 Dependence on renal dialysis: Secondary | ICD-10-CM | POA: Diagnosis not present

## 2023-04-05 DIAGNOSIS — E039 Hypothyroidism, unspecified: Secondary | ICD-10-CM | POA: Diagnosis not present

## 2023-04-05 DIAGNOSIS — N186 End stage renal disease: Secondary | ICD-10-CM | POA: Diagnosis not present

## 2023-04-05 DIAGNOSIS — D631 Anemia in chronic kidney disease: Secondary | ICD-10-CM | POA: Diagnosis not present

## 2023-04-05 DIAGNOSIS — D689 Coagulation defect, unspecified: Secondary | ICD-10-CM | POA: Diagnosis not present

## 2023-04-05 DIAGNOSIS — R519 Headache, unspecified: Secondary | ICD-10-CM | POA: Diagnosis not present

## 2023-04-05 DIAGNOSIS — N2581 Secondary hyperparathyroidism of renal origin: Secondary | ICD-10-CM | POA: Diagnosis not present

## 2023-04-08 DIAGNOSIS — E1129 Type 2 diabetes mellitus with other diabetic kidney complication: Secondary | ICD-10-CM | POA: Diagnosis not present

## 2023-04-08 DIAGNOSIS — Z992 Dependence on renal dialysis: Secondary | ICD-10-CM | POA: Diagnosis not present

## 2023-04-08 DIAGNOSIS — D689 Coagulation defect, unspecified: Secondary | ICD-10-CM | POA: Diagnosis not present

## 2023-04-08 DIAGNOSIS — E039 Hypothyroidism, unspecified: Secondary | ICD-10-CM | POA: Diagnosis not present

## 2023-04-08 DIAGNOSIS — R519 Headache, unspecified: Secondary | ICD-10-CM | POA: Diagnosis not present

## 2023-04-08 DIAGNOSIS — D631 Anemia in chronic kidney disease: Secondary | ICD-10-CM | POA: Diagnosis not present

## 2023-04-08 DIAGNOSIS — N186 End stage renal disease: Secondary | ICD-10-CM | POA: Diagnosis not present

## 2023-04-08 DIAGNOSIS — N2581 Secondary hyperparathyroidism of renal origin: Secondary | ICD-10-CM | POA: Diagnosis not present

## 2023-04-12 DIAGNOSIS — N186 End stage renal disease: Secondary | ICD-10-CM | POA: Diagnosis not present

## 2023-04-12 DIAGNOSIS — D689 Coagulation defect, unspecified: Secondary | ICD-10-CM | POA: Diagnosis not present

## 2023-04-12 DIAGNOSIS — R519 Headache, unspecified: Secondary | ICD-10-CM | POA: Diagnosis not present

## 2023-04-12 DIAGNOSIS — Z992 Dependence on renal dialysis: Secondary | ICD-10-CM | POA: Diagnosis not present

## 2023-04-12 DIAGNOSIS — E1129 Type 2 diabetes mellitus with other diabetic kidney complication: Secondary | ICD-10-CM | POA: Diagnosis not present

## 2023-04-12 DIAGNOSIS — N2581 Secondary hyperparathyroidism of renal origin: Secondary | ICD-10-CM | POA: Diagnosis not present

## 2023-04-12 DIAGNOSIS — E039 Hypothyroidism, unspecified: Secondary | ICD-10-CM | POA: Diagnosis not present

## 2023-04-12 DIAGNOSIS — D631 Anemia in chronic kidney disease: Secondary | ICD-10-CM | POA: Diagnosis not present

## 2023-04-14 ENCOUNTER — Encounter: Payer: Self-pay | Admitting: Vascular Surgery

## 2023-04-15 DIAGNOSIS — R519 Headache, unspecified: Secondary | ICD-10-CM | POA: Diagnosis not present

## 2023-04-15 DIAGNOSIS — E039 Hypothyroidism, unspecified: Secondary | ICD-10-CM | POA: Diagnosis not present

## 2023-04-15 DIAGNOSIS — Z992 Dependence on renal dialysis: Secondary | ICD-10-CM | POA: Diagnosis not present

## 2023-04-15 DIAGNOSIS — E1129 Type 2 diabetes mellitus with other diabetic kidney complication: Secondary | ICD-10-CM | POA: Diagnosis not present

## 2023-04-15 DIAGNOSIS — N186 End stage renal disease: Secondary | ICD-10-CM | POA: Diagnosis not present

## 2023-04-15 DIAGNOSIS — N2581 Secondary hyperparathyroidism of renal origin: Secondary | ICD-10-CM | POA: Diagnosis not present

## 2023-04-15 DIAGNOSIS — D689 Coagulation defect, unspecified: Secondary | ICD-10-CM | POA: Diagnosis not present

## 2023-04-15 DIAGNOSIS — D631 Anemia in chronic kidney disease: Secondary | ICD-10-CM | POA: Diagnosis not present

## 2023-04-17 ENCOUNTER — Telehealth: Payer: Self-pay | Admitting: Vascular Surgery

## 2023-04-17 DIAGNOSIS — S51812A Laceration without foreign body of left forearm, initial encounter: Secondary | ICD-10-CM | POA: Diagnosis not present

## 2023-04-17 DIAGNOSIS — T148XXA Other injury of unspecified body region, initial encounter: Secondary | ICD-10-CM | POA: Diagnosis not present

## 2023-04-17 NOTE — Telephone Encounter (Signed)
Mrs. Oquendo called to cancel her appointment for 04/18/2023 with Dr. Karin Lieu. She states that she has an appointment on 04/18/2023 with [facility] to remove clot from her other graft. Patient requested to have her appointment rescheduled. She was offered 04/24/2023 at 10 am to reschedule her appointment to. She then stated she went to urgent care to remove her staples of her LT UE Graft. She then stated since she had the staples removed she did not feel like this appointment was needed and canceled all together. She said she will call if she needs of our services in the future.

## 2023-04-18 ENCOUNTER — Encounter: Payer: Medicare Other | Admitting: Vascular Surgery

## 2023-04-18 DIAGNOSIS — T82868A Thrombosis of vascular prosthetic devices, implants and grafts, initial encounter: Secondary | ICD-10-CM | POA: Diagnosis not present

## 2023-04-18 DIAGNOSIS — N186 End stage renal disease: Secondary | ICD-10-CM | POA: Diagnosis not present

## 2023-04-18 DIAGNOSIS — I871 Compression of vein: Secondary | ICD-10-CM | POA: Diagnosis not present

## 2023-04-18 DIAGNOSIS — Z992 Dependence on renal dialysis: Secondary | ICD-10-CM | POA: Diagnosis not present

## 2023-04-19 DIAGNOSIS — D631 Anemia in chronic kidney disease: Secondary | ICD-10-CM | POA: Diagnosis not present

## 2023-04-19 DIAGNOSIS — Z992 Dependence on renal dialysis: Secondary | ICD-10-CM | POA: Diagnosis not present

## 2023-04-19 DIAGNOSIS — E039 Hypothyroidism, unspecified: Secondary | ICD-10-CM | POA: Diagnosis not present

## 2023-04-19 DIAGNOSIS — R519 Headache, unspecified: Secondary | ICD-10-CM | POA: Diagnosis not present

## 2023-04-19 DIAGNOSIS — E1129 Type 2 diabetes mellitus with other diabetic kidney complication: Secondary | ICD-10-CM | POA: Diagnosis not present

## 2023-04-19 DIAGNOSIS — D689 Coagulation defect, unspecified: Secondary | ICD-10-CM | POA: Diagnosis not present

## 2023-04-19 DIAGNOSIS — N2581 Secondary hyperparathyroidism of renal origin: Secondary | ICD-10-CM | POA: Diagnosis not present

## 2023-04-19 DIAGNOSIS — N186 End stage renal disease: Secondary | ICD-10-CM | POA: Diagnosis not present

## 2023-04-21 ENCOUNTER — Encounter: Payer: Self-pay | Admitting: Vascular Surgery

## 2023-04-22 DIAGNOSIS — D689 Coagulation defect, unspecified: Secondary | ICD-10-CM | POA: Diagnosis not present

## 2023-04-22 DIAGNOSIS — Z992 Dependence on renal dialysis: Secondary | ICD-10-CM | POA: Diagnosis not present

## 2023-04-22 DIAGNOSIS — E1129 Type 2 diabetes mellitus with other diabetic kidney complication: Secondary | ICD-10-CM | POA: Diagnosis not present

## 2023-04-22 DIAGNOSIS — N186 End stage renal disease: Secondary | ICD-10-CM | POA: Diagnosis not present

## 2023-04-22 DIAGNOSIS — R519 Headache, unspecified: Secondary | ICD-10-CM | POA: Diagnosis not present

## 2023-04-22 DIAGNOSIS — N2581 Secondary hyperparathyroidism of renal origin: Secondary | ICD-10-CM | POA: Diagnosis not present

## 2023-04-22 DIAGNOSIS — D631 Anemia in chronic kidney disease: Secondary | ICD-10-CM | POA: Diagnosis not present

## 2023-04-22 DIAGNOSIS — E039 Hypothyroidism, unspecified: Secondary | ICD-10-CM | POA: Diagnosis not present

## 2023-04-23 DIAGNOSIS — N186 End stage renal disease: Secondary | ICD-10-CM | POA: Diagnosis not present

## 2023-04-23 DIAGNOSIS — T8612 Kidney transplant failure: Secondary | ICD-10-CM | POA: Diagnosis not present

## 2023-04-23 DIAGNOSIS — Z992 Dependence on renal dialysis: Secondary | ICD-10-CM | POA: Diagnosis not present

## 2023-04-24 DIAGNOSIS — N186 End stage renal disease: Secondary | ICD-10-CM | POA: Diagnosis not present

## 2023-04-24 DIAGNOSIS — E039 Hypothyroidism, unspecified: Secondary | ICD-10-CM | POA: Diagnosis not present

## 2023-04-24 DIAGNOSIS — Z992 Dependence on renal dialysis: Secondary | ICD-10-CM | POA: Diagnosis not present

## 2023-04-24 DIAGNOSIS — D689 Coagulation defect, unspecified: Secondary | ICD-10-CM | POA: Diagnosis not present

## 2023-04-24 DIAGNOSIS — D631 Anemia in chronic kidney disease: Secondary | ICD-10-CM | POA: Diagnosis not present

## 2023-04-24 DIAGNOSIS — N2581 Secondary hyperparathyroidism of renal origin: Secondary | ICD-10-CM | POA: Diagnosis not present

## 2023-04-26 DIAGNOSIS — D631 Anemia in chronic kidney disease: Secondary | ICD-10-CM | POA: Diagnosis not present

## 2023-04-26 DIAGNOSIS — E039 Hypothyroidism, unspecified: Secondary | ICD-10-CM | POA: Diagnosis not present

## 2023-04-26 DIAGNOSIS — N2581 Secondary hyperparathyroidism of renal origin: Secondary | ICD-10-CM | POA: Diagnosis not present

## 2023-04-26 DIAGNOSIS — D689 Coagulation defect, unspecified: Secondary | ICD-10-CM | POA: Diagnosis not present

## 2023-04-26 DIAGNOSIS — Z992 Dependence on renal dialysis: Secondary | ICD-10-CM | POA: Diagnosis not present

## 2023-04-26 DIAGNOSIS — N186 End stage renal disease: Secondary | ICD-10-CM | POA: Diagnosis not present

## 2023-04-29 DIAGNOSIS — N2581 Secondary hyperparathyroidism of renal origin: Secondary | ICD-10-CM | POA: Diagnosis not present

## 2023-04-29 DIAGNOSIS — N186 End stage renal disease: Secondary | ICD-10-CM | POA: Diagnosis not present

## 2023-04-29 DIAGNOSIS — Z992 Dependence on renal dialysis: Secondary | ICD-10-CM | POA: Diagnosis not present

## 2023-05-02 DIAGNOSIS — M1120 Other chondrocalcinosis, unspecified site: Secondary | ICD-10-CM | POA: Diagnosis not present

## 2023-05-02 DIAGNOSIS — N186 End stage renal disease: Secondary | ICD-10-CM | POA: Diagnosis not present

## 2023-05-02 DIAGNOSIS — M3501 Sicca syndrome with keratoconjunctivitis: Secondary | ICD-10-CM | POA: Diagnosis not present

## 2023-05-02 DIAGNOSIS — M0579 Rheumatoid arthritis with rheumatoid factor of multiple sites without organ or systems involvement: Secondary | ICD-10-CM | POA: Diagnosis not present

## 2023-05-03 DIAGNOSIS — Z992 Dependence on renal dialysis: Secondary | ICD-10-CM | POA: Diagnosis not present

## 2023-05-03 DIAGNOSIS — D631 Anemia in chronic kidney disease: Secondary | ICD-10-CM | POA: Diagnosis not present

## 2023-05-03 DIAGNOSIS — N186 End stage renal disease: Secondary | ICD-10-CM | POA: Diagnosis not present

## 2023-05-03 DIAGNOSIS — E039 Hypothyroidism, unspecified: Secondary | ICD-10-CM | POA: Diagnosis not present

## 2023-05-03 DIAGNOSIS — N2581 Secondary hyperparathyroidism of renal origin: Secondary | ICD-10-CM | POA: Diagnosis not present

## 2023-05-03 DIAGNOSIS — D689 Coagulation defect, unspecified: Secondary | ICD-10-CM | POA: Diagnosis not present

## 2023-05-06 DIAGNOSIS — D689 Coagulation defect, unspecified: Secondary | ICD-10-CM | POA: Diagnosis not present

## 2023-05-06 DIAGNOSIS — N2581 Secondary hyperparathyroidism of renal origin: Secondary | ICD-10-CM | POA: Diagnosis not present

## 2023-05-06 DIAGNOSIS — E039 Hypothyroidism, unspecified: Secondary | ICD-10-CM | POA: Diagnosis not present

## 2023-05-06 DIAGNOSIS — D631 Anemia in chronic kidney disease: Secondary | ICD-10-CM | POA: Diagnosis not present

## 2023-05-06 DIAGNOSIS — N186 End stage renal disease: Secondary | ICD-10-CM | POA: Diagnosis not present

## 2023-05-06 DIAGNOSIS — Z992 Dependence on renal dialysis: Secondary | ICD-10-CM | POA: Diagnosis not present

## 2023-05-08 DIAGNOSIS — N2581 Secondary hyperparathyroidism of renal origin: Secondary | ICD-10-CM | POA: Diagnosis not present

## 2023-05-08 DIAGNOSIS — E039 Hypothyroidism, unspecified: Secondary | ICD-10-CM | POA: Diagnosis not present

## 2023-05-08 DIAGNOSIS — N186 End stage renal disease: Secondary | ICD-10-CM | POA: Diagnosis not present

## 2023-05-08 DIAGNOSIS — Z992 Dependence on renal dialysis: Secondary | ICD-10-CM | POA: Diagnosis not present

## 2023-05-08 DIAGNOSIS — D689 Coagulation defect, unspecified: Secondary | ICD-10-CM | POA: Diagnosis not present

## 2023-05-08 DIAGNOSIS — D631 Anemia in chronic kidney disease: Secondary | ICD-10-CM | POA: Diagnosis not present

## 2023-05-10 DIAGNOSIS — Z992 Dependence on renal dialysis: Secondary | ICD-10-CM | POA: Diagnosis not present

## 2023-05-10 DIAGNOSIS — D689 Coagulation defect, unspecified: Secondary | ICD-10-CM | POA: Diagnosis not present

## 2023-05-10 DIAGNOSIS — D631 Anemia in chronic kidney disease: Secondary | ICD-10-CM | POA: Diagnosis not present

## 2023-05-10 DIAGNOSIS — N186 End stage renal disease: Secondary | ICD-10-CM | POA: Diagnosis not present

## 2023-05-10 DIAGNOSIS — N2581 Secondary hyperparathyroidism of renal origin: Secondary | ICD-10-CM | POA: Diagnosis not present

## 2023-05-10 DIAGNOSIS — E039 Hypothyroidism, unspecified: Secondary | ICD-10-CM | POA: Diagnosis not present

## 2023-05-12 DIAGNOSIS — D631 Anemia in chronic kidney disease: Secondary | ICD-10-CM | POA: Diagnosis not present

## 2023-05-12 DIAGNOSIS — D689 Coagulation defect, unspecified: Secondary | ICD-10-CM | POA: Diagnosis not present

## 2023-05-12 DIAGNOSIS — N186 End stage renal disease: Secondary | ICD-10-CM | POA: Diagnosis not present

## 2023-05-12 DIAGNOSIS — E039 Hypothyroidism, unspecified: Secondary | ICD-10-CM | POA: Diagnosis not present

## 2023-05-12 DIAGNOSIS — N2581 Secondary hyperparathyroidism of renal origin: Secondary | ICD-10-CM | POA: Diagnosis not present

## 2023-05-12 DIAGNOSIS — Z992 Dependence on renal dialysis: Secondary | ICD-10-CM | POA: Diagnosis not present

## 2023-05-13 DIAGNOSIS — D631 Anemia in chronic kidney disease: Secondary | ICD-10-CM | POA: Diagnosis not present

## 2023-05-13 DIAGNOSIS — Z992 Dependence on renal dialysis: Secondary | ICD-10-CM | POA: Diagnosis not present

## 2023-05-13 DIAGNOSIS — E039 Hypothyroidism, unspecified: Secondary | ICD-10-CM | POA: Diagnosis not present

## 2023-05-13 DIAGNOSIS — N186 End stage renal disease: Secondary | ICD-10-CM | POA: Diagnosis not present

## 2023-05-13 DIAGNOSIS — D689 Coagulation defect, unspecified: Secondary | ICD-10-CM | POA: Diagnosis not present

## 2023-05-13 DIAGNOSIS — N2581 Secondary hyperparathyroidism of renal origin: Secondary | ICD-10-CM | POA: Diagnosis not present

## 2023-05-14 DIAGNOSIS — K080 Exfoliation of teeth due to systemic causes: Secondary | ICD-10-CM | POA: Diagnosis not present

## 2023-05-15 DIAGNOSIS — Z992 Dependence on renal dialysis: Secondary | ICD-10-CM | POA: Diagnosis not present

## 2023-05-15 DIAGNOSIS — E039 Hypothyroidism, unspecified: Secondary | ICD-10-CM | POA: Diagnosis not present

## 2023-05-15 DIAGNOSIS — N2581 Secondary hyperparathyroidism of renal origin: Secondary | ICD-10-CM | POA: Diagnosis not present

## 2023-05-15 DIAGNOSIS — D631 Anemia in chronic kidney disease: Secondary | ICD-10-CM | POA: Diagnosis not present

## 2023-05-15 DIAGNOSIS — D689 Coagulation defect, unspecified: Secondary | ICD-10-CM | POA: Diagnosis not present

## 2023-05-15 DIAGNOSIS — N186 End stage renal disease: Secondary | ICD-10-CM | POA: Diagnosis not present

## 2023-05-17 DIAGNOSIS — Z992 Dependence on renal dialysis: Secondary | ICD-10-CM | POA: Diagnosis not present

## 2023-05-17 DIAGNOSIS — N186 End stage renal disease: Secondary | ICD-10-CM | POA: Diagnosis not present

## 2023-05-17 DIAGNOSIS — N2581 Secondary hyperparathyroidism of renal origin: Secondary | ICD-10-CM | POA: Diagnosis not present

## 2023-05-17 DIAGNOSIS — D631 Anemia in chronic kidney disease: Secondary | ICD-10-CM | POA: Diagnosis not present

## 2023-05-17 DIAGNOSIS — D689 Coagulation defect, unspecified: Secondary | ICD-10-CM | POA: Diagnosis not present

## 2023-05-17 DIAGNOSIS — E039 Hypothyroidism, unspecified: Secondary | ICD-10-CM | POA: Diagnosis not present

## 2023-05-20 DIAGNOSIS — D689 Coagulation defect, unspecified: Secondary | ICD-10-CM | POA: Diagnosis not present

## 2023-05-20 DIAGNOSIS — Z992 Dependence on renal dialysis: Secondary | ICD-10-CM | POA: Diagnosis not present

## 2023-05-20 DIAGNOSIS — N2581 Secondary hyperparathyroidism of renal origin: Secondary | ICD-10-CM | POA: Diagnosis not present

## 2023-05-20 DIAGNOSIS — E039 Hypothyroidism, unspecified: Secondary | ICD-10-CM | POA: Diagnosis not present

## 2023-05-20 DIAGNOSIS — D631 Anemia in chronic kidney disease: Secondary | ICD-10-CM | POA: Diagnosis not present

## 2023-05-20 DIAGNOSIS — N186 End stage renal disease: Secondary | ICD-10-CM | POA: Diagnosis not present

## 2023-05-22 DIAGNOSIS — D631 Anemia in chronic kidney disease: Secondary | ICD-10-CM | POA: Diagnosis not present

## 2023-05-22 DIAGNOSIS — N186 End stage renal disease: Secondary | ICD-10-CM | POA: Diagnosis not present

## 2023-05-22 DIAGNOSIS — E039 Hypothyroidism, unspecified: Secondary | ICD-10-CM | POA: Diagnosis not present

## 2023-05-22 DIAGNOSIS — N2581 Secondary hyperparathyroidism of renal origin: Secondary | ICD-10-CM | POA: Diagnosis not present

## 2023-05-22 DIAGNOSIS — D689 Coagulation defect, unspecified: Secondary | ICD-10-CM | POA: Diagnosis not present

## 2023-05-22 DIAGNOSIS — Z992 Dependence on renal dialysis: Secondary | ICD-10-CM | POA: Diagnosis not present

## 2023-05-24 DIAGNOSIS — J029 Acute pharyngitis, unspecified: Secondary | ICD-10-CM | POA: Diagnosis not present

## 2023-05-24 DIAGNOSIS — T8612 Kidney transplant failure: Secondary | ICD-10-CM | POA: Diagnosis not present

## 2023-05-24 DIAGNOSIS — D689 Coagulation defect, unspecified: Secondary | ICD-10-CM | POA: Diagnosis not present

## 2023-05-24 DIAGNOSIS — Z20822 Contact with and (suspected) exposure to covid-19: Secondary | ICD-10-CM | POA: Diagnosis not present

## 2023-05-24 DIAGNOSIS — Z992 Dependence on renal dialysis: Secondary | ICD-10-CM | POA: Diagnosis not present

## 2023-05-24 DIAGNOSIS — N2581 Secondary hyperparathyroidism of renal origin: Secondary | ICD-10-CM | POA: Diagnosis not present

## 2023-05-24 DIAGNOSIS — N186 End stage renal disease: Secondary | ICD-10-CM | POA: Diagnosis not present

## 2023-05-24 DIAGNOSIS — D631 Anemia in chronic kidney disease: Secondary | ICD-10-CM | POA: Diagnosis not present

## 2023-05-24 DIAGNOSIS — E039 Hypothyroidism, unspecified: Secondary | ICD-10-CM | POA: Diagnosis not present

## 2023-05-24 DIAGNOSIS — R059 Cough, unspecified: Secondary | ICD-10-CM | POA: Diagnosis not present

## 2023-05-28 DIAGNOSIS — N186 End stage renal disease: Secondary | ICD-10-CM | POA: Diagnosis not present

## 2023-05-28 DIAGNOSIS — D689 Coagulation defect, unspecified: Secondary | ICD-10-CM | POA: Diagnosis not present

## 2023-05-28 DIAGNOSIS — T82868A Thrombosis of vascular prosthetic devices, implants and grafts, initial encounter: Secondary | ICD-10-CM | POA: Diagnosis not present

## 2023-05-28 DIAGNOSIS — D631 Anemia in chronic kidney disease: Secondary | ICD-10-CM | POA: Diagnosis not present

## 2023-05-28 DIAGNOSIS — N2581 Secondary hyperparathyroidism of renal origin: Secondary | ICD-10-CM | POA: Diagnosis not present

## 2023-05-28 DIAGNOSIS — Z992 Dependence on renal dialysis: Secondary | ICD-10-CM | POA: Diagnosis not present

## 2023-05-28 DIAGNOSIS — E039 Hypothyroidism, unspecified: Secondary | ICD-10-CM | POA: Diagnosis not present

## 2023-05-28 DIAGNOSIS — I871 Compression of vein: Secondary | ICD-10-CM | POA: Diagnosis not present

## 2023-05-28 DIAGNOSIS — T82858A Stenosis of vascular prosthetic devices, implants and grafts, initial encounter: Secondary | ICD-10-CM | POA: Diagnosis not present

## 2023-05-29 DIAGNOSIS — D689 Coagulation defect, unspecified: Secondary | ICD-10-CM | POA: Diagnosis not present

## 2023-05-29 DIAGNOSIS — E039 Hypothyroidism, unspecified: Secondary | ICD-10-CM | POA: Diagnosis not present

## 2023-05-29 DIAGNOSIS — N2581 Secondary hyperparathyroidism of renal origin: Secondary | ICD-10-CM | POA: Diagnosis not present

## 2023-05-29 DIAGNOSIS — N186 End stage renal disease: Secondary | ICD-10-CM | POA: Diagnosis not present

## 2023-05-29 DIAGNOSIS — Z992 Dependence on renal dialysis: Secondary | ICD-10-CM | POA: Diagnosis not present

## 2023-05-29 DIAGNOSIS — D631 Anemia in chronic kidney disease: Secondary | ICD-10-CM | POA: Diagnosis not present

## 2023-05-31 DIAGNOSIS — Z992 Dependence on renal dialysis: Secondary | ICD-10-CM | POA: Diagnosis not present

## 2023-05-31 DIAGNOSIS — D689 Coagulation defect, unspecified: Secondary | ICD-10-CM | POA: Diagnosis not present

## 2023-05-31 DIAGNOSIS — D631 Anemia in chronic kidney disease: Secondary | ICD-10-CM | POA: Diagnosis not present

## 2023-05-31 DIAGNOSIS — E039 Hypothyroidism, unspecified: Secondary | ICD-10-CM | POA: Diagnosis not present

## 2023-05-31 DIAGNOSIS — N2581 Secondary hyperparathyroidism of renal origin: Secondary | ICD-10-CM | POA: Diagnosis not present

## 2023-05-31 DIAGNOSIS — N186 End stage renal disease: Secondary | ICD-10-CM | POA: Diagnosis not present

## 2023-06-03 DIAGNOSIS — Z992 Dependence on renal dialysis: Secondary | ICD-10-CM | POA: Diagnosis not present

## 2023-06-03 DIAGNOSIS — D631 Anemia in chronic kidney disease: Secondary | ICD-10-CM | POA: Diagnosis not present

## 2023-06-03 DIAGNOSIS — N186 End stage renal disease: Secondary | ICD-10-CM | POA: Diagnosis not present

## 2023-06-03 DIAGNOSIS — N2581 Secondary hyperparathyroidism of renal origin: Secondary | ICD-10-CM | POA: Diagnosis not present

## 2023-06-03 DIAGNOSIS — D689 Coagulation defect, unspecified: Secondary | ICD-10-CM | POA: Diagnosis not present

## 2023-06-03 DIAGNOSIS — E039 Hypothyroidism, unspecified: Secondary | ICD-10-CM | POA: Diagnosis not present

## 2023-06-05 DIAGNOSIS — D689 Coagulation defect, unspecified: Secondary | ICD-10-CM | POA: Diagnosis not present

## 2023-06-05 DIAGNOSIS — E039 Hypothyroidism, unspecified: Secondary | ICD-10-CM | POA: Diagnosis not present

## 2023-06-05 DIAGNOSIS — N186 End stage renal disease: Secondary | ICD-10-CM | POA: Diagnosis not present

## 2023-06-05 DIAGNOSIS — Z992 Dependence on renal dialysis: Secondary | ICD-10-CM | POA: Diagnosis not present

## 2023-06-05 DIAGNOSIS — N2581 Secondary hyperparathyroidism of renal origin: Secondary | ICD-10-CM | POA: Diagnosis not present

## 2023-06-05 DIAGNOSIS — D631 Anemia in chronic kidney disease: Secondary | ICD-10-CM | POA: Diagnosis not present

## 2023-06-07 DIAGNOSIS — Z992 Dependence on renal dialysis: Secondary | ICD-10-CM | POA: Diagnosis not present

## 2023-06-07 DIAGNOSIS — D631 Anemia in chronic kidney disease: Secondary | ICD-10-CM | POA: Diagnosis not present

## 2023-06-07 DIAGNOSIS — E039 Hypothyroidism, unspecified: Secondary | ICD-10-CM | POA: Diagnosis not present

## 2023-06-07 DIAGNOSIS — N186 End stage renal disease: Secondary | ICD-10-CM | POA: Diagnosis not present

## 2023-06-07 DIAGNOSIS — N2581 Secondary hyperparathyroidism of renal origin: Secondary | ICD-10-CM | POA: Diagnosis not present

## 2023-06-07 DIAGNOSIS — D689 Coagulation defect, unspecified: Secondary | ICD-10-CM | POA: Diagnosis not present

## 2023-06-10 DIAGNOSIS — N186 End stage renal disease: Secondary | ICD-10-CM | POA: Diagnosis not present

## 2023-06-10 DIAGNOSIS — E559 Vitamin D deficiency, unspecified: Secondary | ICD-10-CM | POA: Diagnosis not present

## 2023-06-10 DIAGNOSIS — I2510 Atherosclerotic heart disease of native coronary artery without angina pectoris: Secondary | ICD-10-CM | POA: Diagnosis not present

## 2023-06-10 DIAGNOSIS — E538 Deficiency of other specified B group vitamins: Secondary | ICD-10-CM | POA: Diagnosis not present

## 2023-06-10 DIAGNOSIS — J449 Chronic obstructive pulmonary disease, unspecified: Secondary | ICD-10-CM | POA: Diagnosis not present

## 2023-06-10 DIAGNOSIS — D689 Coagulation defect, unspecified: Secondary | ICD-10-CM | POA: Diagnosis not present

## 2023-06-10 DIAGNOSIS — D631 Anemia in chronic kidney disease: Secondary | ICD-10-CM | POA: Diagnosis not present

## 2023-06-10 DIAGNOSIS — E039 Hypothyroidism, unspecified: Secondary | ICD-10-CM | POA: Diagnosis not present

## 2023-06-10 DIAGNOSIS — N2581 Secondary hyperparathyroidism of renal origin: Secondary | ICD-10-CM | POA: Diagnosis not present

## 2023-06-10 DIAGNOSIS — Z992 Dependence on renal dialysis: Secondary | ICD-10-CM | POA: Diagnosis not present

## 2023-06-10 DIAGNOSIS — E785 Hyperlipidemia, unspecified: Secondary | ICD-10-CM | POA: Diagnosis not present

## 2023-06-12 DIAGNOSIS — N2581 Secondary hyperparathyroidism of renal origin: Secondary | ICD-10-CM | POA: Diagnosis not present

## 2023-06-12 DIAGNOSIS — D631 Anemia in chronic kidney disease: Secondary | ICD-10-CM | POA: Diagnosis not present

## 2023-06-12 DIAGNOSIS — Z992 Dependence on renal dialysis: Secondary | ICD-10-CM | POA: Diagnosis not present

## 2023-06-12 DIAGNOSIS — D689 Coagulation defect, unspecified: Secondary | ICD-10-CM | POA: Diagnosis not present

## 2023-06-12 DIAGNOSIS — E039 Hypothyroidism, unspecified: Secondary | ICD-10-CM | POA: Diagnosis not present

## 2023-06-12 DIAGNOSIS — N186 End stage renal disease: Secondary | ICD-10-CM | POA: Diagnosis not present

## 2023-06-14 DIAGNOSIS — N186 End stage renal disease: Secondary | ICD-10-CM | POA: Diagnosis not present

## 2023-06-14 DIAGNOSIS — N2581 Secondary hyperparathyroidism of renal origin: Secondary | ICD-10-CM | POA: Diagnosis not present

## 2023-06-14 DIAGNOSIS — E039 Hypothyroidism, unspecified: Secondary | ICD-10-CM | POA: Diagnosis not present

## 2023-06-14 DIAGNOSIS — Z992 Dependence on renal dialysis: Secondary | ICD-10-CM | POA: Diagnosis not present

## 2023-06-14 DIAGNOSIS — D631 Anemia in chronic kidney disease: Secondary | ICD-10-CM | POA: Diagnosis not present

## 2023-06-14 DIAGNOSIS — D689 Coagulation defect, unspecified: Secondary | ICD-10-CM | POA: Diagnosis not present

## 2023-06-17 ENCOUNTER — Ambulatory Visit
Admission: RE | Admit: 2023-06-17 | Discharge: 2023-06-17 | Disposition: A | Discharge status: Home / Self Care | Payer: Medicare Other | Source: Ambulatory Visit | Attending: Family Medicine | Admitting: Family Medicine

## 2023-06-17 ENCOUNTER — Other Ambulatory Visit: Payer: Self-pay | Admitting: Family Medicine

## 2023-06-17 DIAGNOSIS — M21952 Unspecified acquired deformity of left thigh: Secondary | ICD-10-CM

## 2023-06-17 DIAGNOSIS — N2581 Secondary hyperparathyroidism of renal origin: Secondary | ICD-10-CM | POA: Diagnosis not present

## 2023-06-17 DIAGNOSIS — N186 End stage renal disease: Secondary | ICD-10-CM | POA: Diagnosis not present

## 2023-06-17 DIAGNOSIS — D689 Coagulation defect, unspecified: Secondary | ICD-10-CM | POA: Diagnosis not present

## 2023-06-17 DIAGNOSIS — D631 Anemia in chronic kidney disease: Secondary | ICD-10-CM | POA: Diagnosis not present

## 2023-06-17 DIAGNOSIS — E039 Hypothyroidism, unspecified: Secondary | ICD-10-CM | POA: Diagnosis not present

## 2023-06-17 DIAGNOSIS — Z96642 Presence of left artificial hip joint: Secondary | ICD-10-CM | POA: Diagnosis not present

## 2023-06-17 DIAGNOSIS — Z992 Dependence on renal dialysis: Secondary | ICD-10-CM | POA: Diagnosis not present

## 2023-06-21 DIAGNOSIS — E039 Hypothyroidism, unspecified: Secondary | ICD-10-CM | POA: Diagnosis not present

## 2023-06-21 DIAGNOSIS — Z992 Dependence on renal dialysis: Secondary | ICD-10-CM | POA: Diagnosis not present

## 2023-06-21 DIAGNOSIS — D689 Coagulation defect, unspecified: Secondary | ICD-10-CM | POA: Diagnosis not present

## 2023-06-21 DIAGNOSIS — N186 End stage renal disease: Secondary | ICD-10-CM | POA: Diagnosis not present

## 2023-06-21 DIAGNOSIS — D631 Anemia in chronic kidney disease: Secondary | ICD-10-CM | POA: Diagnosis not present

## 2023-06-21 DIAGNOSIS — N2581 Secondary hyperparathyroidism of renal origin: Secondary | ICD-10-CM | POA: Diagnosis not present

## 2023-06-23 DIAGNOSIS — Z992 Dependence on renal dialysis: Secondary | ICD-10-CM | POA: Diagnosis not present

## 2023-06-23 DIAGNOSIS — N186 End stage renal disease: Secondary | ICD-10-CM | POA: Diagnosis not present

## 2023-06-23 DIAGNOSIS — T8612 Kidney transplant failure: Secondary | ICD-10-CM | POA: Diagnosis not present

## 2023-06-24 DIAGNOSIS — E039 Hypothyroidism, unspecified: Secondary | ICD-10-CM | POA: Diagnosis not present

## 2023-06-24 DIAGNOSIS — Z992 Dependence on renal dialysis: Secondary | ICD-10-CM | POA: Diagnosis not present

## 2023-06-24 DIAGNOSIS — D631 Anemia in chronic kidney disease: Secondary | ICD-10-CM | POA: Diagnosis not present

## 2023-06-24 DIAGNOSIS — Z23 Encounter for immunization: Secondary | ICD-10-CM | POA: Diagnosis not present

## 2023-06-24 DIAGNOSIS — D689 Coagulation defect, unspecified: Secondary | ICD-10-CM | POA: Diagnosis not present

## 2023-06-24 DIAGNOSIS — E1129 Type 2 diabetes mellitus with other diabetic kidney complication: Secondary | ICD-10-CM | POA: Diagnosis not present

## 2023-06-24 DIAGNOSIS — N186 End stage renal disease: Secondary | ICD-10-CM | POA: Diagnosis not present

## 2023-06-24 DIAGNOSIS — N2581 Secondary hyperparathyroidism of renal origin: Secondary | ICD-10-CM | POA: Diagnosis not present

## 2023-06-24 DIAGNOSIS — R519 Headache, unspecified: Secondary | ICD-10-CM | POA: Diagnosis not present

## 2023-06-28 DIAGNOSIS — E039 Hypothyroidism, unspecified: Secondary | ICD-10-CM | POA: Diagnosis not present

## 2023-06-28 DIAGNOSIS — Z992 Dependence on renal dialysis: Secondary | ICD-10-CM | POA: Diagnosis not present

## 2023-06-28 DIAGNOSIS — E1129 Type 2 diabetes mellitus with other diabetic kidney complication: Secondary | ICD-10-CM | POA: Diagnosis not present

## 2023-06-28 DIAGNOSIS — N2581 Secondary hyperparathyroidism of renal origin: Secondary | ICD-10-CM | POA: Diagnosis not present

## 2023-06-28 DIAGNOSIS — D689 Coagulation defect, unspecified: Secondary | ICD-10-CM | POA: Diagnosis not present

## 2023-06-28 DIAGNOSIS — D631 Anemia in chronic kidney disease: Secondary | ICD-10-CM | POA: Diagnosis not present

## 2023-06-28 DIAGNOSIS — N186 End stage renal disease: Secondary | ICD-10-CM | POA: Diagnosis not present

## 2023-06-28 DIAGNOSIS — R519 Headache, unspecified: Secondary | ICD-10-CM | POA: Diagnosis not present

## 2023-06-28 DIAGNOSIS — Z23 Encounter for immunization: Secondary | ICD-10-CM | POA: Diagnosis not present

## 2023-06-30 DIAGNOSIS — Z96642 Presence of left artificial hip joint: Secondary | ICD-10-CM | POA: Diagnosis not present

## 2023-06-30 DIAGNOSIS — Z471 Aftercare following joint replacement surgery: Secondary | ICD-10-CM | POA: Diagnosis not present

## 2023-07-01 DIAGNOSIS — R519 Headache, unspecified: Secondary | ICD-10-CM | POA: Diagnosis not present

## 2023-07-01 DIAGNOSIS — Z992 Dependence on renal dialysis: Secondary | ICD-10-CM | POA: Diagnosis not present

## 2023-07-01 DIAGNOSIS — Z23 Encounter for immunization: Secondary | ICD-10-CM | POA: Diagnosis not present

## 2023-07-01 DIAGNOSIS — E1129 Type 2 diabetes mellitus with other diabetic kidney complication: Secondary | ICD-10-CM | POA: Diagnosis not present

## 2023-07-01 DIAGNOSIS — D689 Coagulation defect, unspecified: Secondary | ICD-10-CM | POA: Diagnosis not present

## 2023-07-01 DIAGNOSIS — E039 Hypothyroidism, unspecified: Secondary | ICD-10-CM | POA: Diagnosis not present

## 2023-07-01 DIAGNOSIS — D631 Anemia in chronic kidney disease: Secondary | ICD-10-CM | POA: Diagnosis not present

## 2023-07-01 DIAGNOSIS — N186 End stage renal disease: Secondary | ICD-10-CM | POA: Diagnosis not present

## 2023-07-01 DIAGNOSIS — N2581 Secondary hyperparathyroidism of renal origin: Secondary | ICD-10-CM | POA: Diagnosis not present

## 2023-07-03 DIAGNOSIS — R519 Headache, unspecified: Secondary | ICD-10-CM | POA: Diagnosis not present

## 2023-07-03 DIAGNOSIS — Z992 Dependence on renal dialysis: Secondary | ICD-10-CM | POA: Diagnosis not present

## 2023-07-03 DIAGNOSIS — E039 Hypothyroidism, unspecified: Secondary | ICD-10-CM | POA: Diagnosis not present

## 2023-07-03 DIAGNOSIS — N186 End stage renal disease: Secondary | ICD-10-CM | POA: Diagnosis not present

## 2023-07-03 DIAGNOSIS — E1129 Type 2 diabetes mellitus with other diabetic kidney complication: Secondary | ICD-10-CM | POA: Diagnosis not present

## 2023-07-03 DIAGNOSIS — Z23 Encounter for immunization: Secondary | ICD-10-CM | POA: Diagnosis not present

## 2023-07-03 DIAGNOSIS — N2581 Secondary hyperparathyroidism of renal origin: Secondary | ICD-10-CM | POA: Diagnosis not present

## 2023-07-03 DIAGNOSIS — D631 Anemia in chronic kidney disease: Secondary | ICD-10-CM | POA: Diagnosis not present

## 2023-07-03 DIAGNOSIS — D689 Coagulation defect, unspecified: Secondary | ICD-10-CM | POA: Diagnosis not present

## 2023-07-05 DIAGNOSIS — N186 End stage renal disease: Secondary | ICD-10-CM | POA: Diagnosis not present

## 2023-07-05 DIAGNOSIS — Z23 Encounter for immunization: Secondary | ICD-10-CM | POA: Diagnosis not present

## 2023-07-05 DIAGNOSIS — E1129 Type 2 diabetes mellitus with other diabetic kidney complication: Secondary | ICD-10-CM | POA: Diagnosis not present

## 2023-07-05 DIAGNOSIS — N2581 Secondary hyperparathyroidism of renal origin: Secondary | ICD-10-CM | POA: Diagnosis not present

## 2023-07-05 DIAGNOSIS — D631 Anemia in chronic kidney disease: Secondary | ICD-10-CM | POA: Diagnosis not present

## 2023-07-05 DIAGNOSIS — E039 Hypothyroidism, unspecified: Secondary | ICD-10-CM | POA: Diagnosis not present

## 2023-07-05 DIAGNOSIS — D689 Coagulation defect, unspecified: Secondary | ICD-10-CM | POA: Diagnosis not present

## 2023-07-05 DIAGNOSIS — R519 Headache, unspecified: Secondary | ICD-10-CM | POA: Diagnosis not present

## 2023-07-05 DIAGNOSIS — Z992 Dependence on renal dialysis: Secondary | ICD-10-CM | POA: Diagnosis not present

## 2023-07-08 DIAGNOSIS — N186 End stage renal disease: Secondary | ICD-10-CM | POA: Diagnosis not present

## 2023-07-08 DIAGNOSIS — Z23 Encounter for immunization: Secondary | ICD-10-CM | POA: Diagnosis not present

## 2023-07-08 DIAGNOSIS — D631 Anemia in chronic kidney disease: Secondary | ICD-10-CM | POA: Diagnosis not present

## 2023-07-08 DIAGNOSIS — E039 Hypothyroidism, unspecified: Secondary | ICD-10-CM | POA: Diagnosis not present

## 2023-07-08 DIAGNOSIS — Z992 Dependence on renal dialysis: Secondary | ICD-10-CM | POA: Diagnosis not present

## 2023-07-08 DIAGNOSIS — N2581 Secondary hyperparathyroidism of renal origin: Secondary | ICD-10-CM | POA: Diagnosis not present

## 2023-07-08 DIAGNOSIS — R519 Headache, unspecified: Secondary | ICD-10-CM | POA: Diagnosis not present

## 2023-07-08 DIAGNOSIS — D689 Coagulation defect, unspecified: Secondary | ICD-10-CM | POA: Diagnosis not present

## 2023-07-08 DIAGNOSIS — E1129 Type 2 diabetes mellitus with other diabetic kidney complication: Secondary | ICD-10-CM | POA: Diagnosis not present

## 2023-07-12 DIAGNOSIS — Z992 Dependence on renal dialysis: Secondary | ICD-10-CM | POA: Diagnosis not present

## 2023-07-12 DIAGNOSIS — D631 Anemia in chronic kidney disease: Secondary | ICD-10-CM | POA: Diagnosis not present

## 2023-07-12 DIAGNOSIS — N2581 Secondary hyperparathyroidism of renal origin: Secondary | ICD-10-CM | POA: Diagnosis not present

## 2023-07-12 DIAGNOSIS — N186 End stage renal disease: Secondary | ICD-10-CM | POA: Diagnosis not present

## 2023-07-12 DIAGNOSIS — E1129 Type 2 diabetes mellitus with other diabetic kidney complication: Secondary | ICD-10-CM | POA: Diagnosis not present

## 2023-07-12 DIAGNOSIS — D689 Coagulation defect, unspecified: Secondary | ICD-10-CM | POA: Diagnosis not present

## 2023-07-12 DIAGNOSIS — E039 Hypothyroidism, unspecified: Secondary | ICD-10-CM | POA: Diagnosis not present

## 2023-07-12 DIAGNOSIS — R519 Headache, unspecified: Secondary | ICD-10-CM | POA: Diagnosis not present

## 2023-07-12 DIAGNOSIS — Z23 Encounter for immunization: Secondary | ICD-10-CM | POA: Diagnosis not present

## 2023-07-15 DIAGNOSIS — E1129 Type 2 diabetes mellitus with other diabetic kidney complication: Secondary | ICD-10-CM | POA: Diagnosis not present

## 2023-07-15 DIAGNOSIS — Z992 Dependence on renal dialysis: Secondary | ICD-10-CM | POA: Diagnosis not present

## 2023-07-15 DIAGNOSIS — N2581 Secondary hyperparathyroidism of renal origin: Secondary | ICD-10-CM | POA: Diagnosis not present

## 2023-07-15 DIAGNOSIS — D631 Anemia in chronic kidney disease: Secondary | ICD-10-CM | POA: Diagnosis not present

## 2023-07-15 DIAGNOSIS — E039 Hypothyroidism, unspecified: Secondary | ICD-10-CM | POA: Diagnosis not present

## 2023-07-15 DIAGNOSIS — R519 Headache, unspecified: Secondary | ICD-10-CM | POA: Diagnosis not present

## 2023-07-15 DIAGNOSIS — D689 Coagulation defect, unspecified: Secondary | ICD-10-CM | POA: Diagnosis not present

## 2023-07-15 DIAGNOSIS — Z23 Encounter for immunization: Secondary | ICD-10-CM | POA: Diagnosis not present

## 2023-07-15 DIAGNOSIS — N186 End stage renal disease: Secondary | ICD-10-CM | POA: Diagnosis not present

## 2023-07-19 DIAGNOSIS — D689 Coagulation defect, unspecified: Secondary | ICD-10-CM | POA: Diagnosis not present

## 2023-07-19 DIAGNOSIS — R519 Headache, unspecified: Secondary | ICD-10-CM | POA: Diagnosis not present

## 2023-07-19 DIAGNOSIS — N186 End stage renal disease: Secondary | ICD-10-CM | POA: Diagnosis not present

## 2023-07-19 DIAGNOSIS — E039 Hypothyroidism, unspecified: Secondary | ICD-10-CM | POA: Diagnosis not present

## 2023-07-19 DIAGNOSIS — N2581 Secondary hyperparathyroidism of renal origin: Secondary | ICD-10-CM | POA: Diagnosis not present

## 2023-07-19 DIAGNOSIS — Z992 Dependence on renal dialysis: Secondary | ICD-10-CM | POA: Diagnosis not present

## 2023-07-19 DIAGNOSIS — Z23 Encounter for immunization: Secondary | ICD-10-CM | POA: Diagnosis not present

## 2023-07-19 DIAGNOSIS — E1129 Type 2 diabetes mellitus with other diabetic kidney complication: Secondary | ICD-10-CM | POA: Diagnosis not present

## 2023-07-19 DIAGNOSIS — D631 Anemia in chronic kidney disease: Secondary | ICD-10-CM | POA: Diagnosis not present

## 2023-07-22 DIAGNOSIS — D689 Coagulation defect, unspecified: Secondary | ICD-10-CM | POA: Diagnosis not present

## 2023-07-22 DIAGNOSIS — E1129 Type 2 diabetes mellitus with other diabetic kidney complication: Secondary | ICD-10-CM | POA: Diagnosis not present

## 2023-07-22 DIAGNOSIS — N186 End stage renal disease: Secondary | ICD-10-CM | POA: Diagnosis not present

## 2023-07-22 DIAGNOSIS — R519 Headache, unspecified: Secondary | ICD-10-CM | POA: Diagnosis not present

## 2023-07-22 DIAGNOSIS — D631 Anemia in chronic kidney disease: Secondary | ICD-10-CM | POA: Diagnosis not present

## 2023-07-22 DIAGNOSIS — Z992 Dependence on renal dialysis: Secondary | ICD-10-CM | POA: Diagnosis not present

## 2023-07-22 DIAGNOSIS — Z23 Encounter for immunization: Secondary | ICD-10-CM | POA: Diagnosis not present

## 2023-07-22 DIAGNOSIS — E039 Hypothyroidism, unspecified: Secondary | ICD-10-CM | POA: Diagnosis not present

## 2023-07-22 DIAGNOSIS — N2581 Secondary hyperparathyroidism of renal origin: Secondary | ICD-10-CM | POA: Diagnosis not present

## 2023-07-24 DIAGNOSIS — E1129 Type 2 diabetes mellitus with other diabetic kidney complication: Secondary | ICD-10-CM | POA: Diagnosis not present

## 2023-07-24 DIAGNOSIS — E039 Hypothyroidism, unspecified: Secondary | ICD-10-CM | POA: Diagnosis not present

## 2023-07-24 DIAGNOSIS — Z23 Encounter for immunization: Secondary | ICD-10-CM | POA: Diagnosis not present

## 2023-07-24 DIAGNOSIS — R519 Headache, unspecified: Secondary | ICD-10-CM | POA: Diagnosis not present

## 2023-07-24 DIAGNOSIS — N2581 Secondary hyperparathyroidism of renal origin: Secondary | ICD-10-CM | POA: Diagnosis not present

## 2023-07-24 DIAGNOSIS — D689 Coagulation defect, unspecified: Secondary | ICD-10-CM | POA: Diagnosis not present

## 2023-07-24 DIAGNOSIS — Z992 Dependence on renal dialysis: Secondary | ICD-10-CM | POA: Diagnosis not present

## 2023-07-24 DIAGNOSIS — D631 Anemia in chronic kidney disease: Secondary | ICD-10-CM | POA: Diagnosis not present

## 2023-07-24 DIAGNOSIS — T8612 Kidney transplant failure: Secondary | ICD-10-CM | POA: Diagnosis not present

## 2023-07-24 DIAGNOSIS — N186 End stage renal disease: Secondary | ICD-10-CM | POA: Diagnosis not present

## 2023-07-26 DIAGNOSIS — N186 End stage renal disease: Secondary | ICD-10-CM | POA: Diagnosis not present

## 2023-07-26 DIAGNOSIS — Z992 Dependence on renal dialysis: Secondary | ICD-10-CM | POA: Diagnosis not present

## 2023-07-26 DIAGNOSIS — N2581 Secondary hyperparathyroidism of renal origin: Secondary | ICD-10-CM | POA: Diagnosis not present

## 2023-07-26 DIAGNOSIS — E039 Hypothyroidism, unspecified: Secondary | ICD-10-CM | POA: Diagnosis not present

## 2023-07-26 DIAGNOSIS — D689 Coagulation defect, unspecified: Secondary | ICD-10-CM | POA: Diagnosis not present

## 2023-07-26 DIAGNOSIS — R52 Pain, unspecified: Secondary | ICD-10-CM | POA: Diagnosis not present

## 2023-07-26 DIAGNOSIS — D631 Anemia in chronic kidney disease: Secondary | ICD-10-CM | POA: Diagnosis not present

## 2023-07-29 DIAGNOSIS — N186 End stage renal disease: Secondary | ICD-10-CM | POA: Diagnosis not present

## 2023-07-29 DIAGNOSIS — N2581 Secondary hyperparathyroidism of renal origin: Secondary | ICD-10-CM | POA: Diagnosis not present

## 2023-07-29 DIAGNOSIS — D689 Coagulation defect, unspecified: Secondary | ICD-10-CM | POA: Diagnosis not present

## 2023-07-29 DIAGNOSIS — Z992 Dependence on renal dialysis: Secondary | ICD-10-CM | POA: Diagnosis not present

## 2023-07-29 DIAGNOSIS — E039 Hypothyroidism, unspecified: Secondary | ICD-10-CM | POA: Diagnosis not present

## 2023-07-29 DIAGNOSIS — R52 Pain, unspecified: Secondary | ICD-10-CM | POA: Diagnosis not present

## 2023-07-29 DIAGNOSIS — D631 Anemia in chronic kidney disease: Secondary | ICD-10-CM | POA: Diagnosis not present

## 2023-08-02 DIAGNOSIS — N2581 Secondary hyperparathyroidism of renal origin: Secondary | ICD-10-CM | POA: Diagnosis not present

## 2023-08-02 DIAGNOSIS — D631 Anemia in chronic kidney disease: Secondary | ICD-10-CM | POA: Diagnosis not present

## 2023-08-02 DIAGNOSIS — Z992 Dependence on renal dialysis: Secondary | ICD-10-CM | POA: Diagnosis not present

## 2023-08-02 DIAGNOSIS — D689 Coagulation defect, unspecified: Secondary | ICD-10-CM | POA: Diagnosis not present

## 2023-08-02 DIAGNOSIS — N186 End stage renal disease: Secondary | ICD-10-CM | POA: Diagnosis not present

## 2023-08-02 DIAGNOSIS — R52 Pain, unspecified: Secondary | ICD-10-CM | POA: Diagnosis not present

## 2023-08-02 DIAGNOSIS — E039 Hypothyroidism, unspecified: Secondary | ICD-10-CM | POA: Diagnosis not present

## 2023-08-05 DIAGNOSIS — E039 Hypothyroidism, unspecified: Secondary | ICD-10-CM | POA: Diagnosis not present

## 2023-08-05 DIAGNOSIS — N2581 Secondary hyperparathyroidism of renal origin: Secondary | ICD-10-CM | POA: Diagnosis not present

## 2023-08-05 DIAGNOSIS — N186 End stage renal disease: Secondary | ICD-10-CM | POA: Diagnosis not present

## 2023-08-05 DIAGNOSIS — D631 Anemia in chronic kidney disease: Secondary | ICD-10-CM | POA: Diagnosis not present

## 2023-08-05 DIAGNOSIS — R52 Pain, unspecified: Secondary | ICD-10-CM | POA: Diagnosis not present

## 2023-08-05 DIAGNOSIS — Z992 Dependence on renal dialysis: Secondary | ICD-10-CM | POA: Diagnosis not present

## 2023-08-05 DIAGNOSIS — D689 Coagulation defect, unspecified: Secondary | ICD-10-CM | POA: Diagnosis not present

## 2023-08-07 DIAGNOSIS — D689 Coagulation defect, unspecified: Secondary | ICD-10-CM | POA: Diagnosis not present

## 2023-08-07 DIAGNOSIS — D631 Anemia in chronic kidney disease: Secondary | ICD-10-CM | POA: Diagnosis not present

## 2023-08-07 DIAGNOSIS — N186 End stage renal disease: Secondary | ICD-10-CM | POA: Diagnosis not present

## 2023-08-07 DIAGNOSIS — N2581 Secondary hyperparathyroidism of renal origin: Secondary | ICD-10-CM | POA: Diagnosis not present

## 2023-08-07 DIAGNOSIS — E039 Hypothyroidism, unspecified: Secondary | ICD-10-CM | POA: Diagnosis not present

## 2023-08-07 DIAGNOSIS — Z992 Dependence on renal dialysis: Secondary | ICD-10-CM | POA: Diagnosis not present

## 2023-08-07 DIAGNOSIS — R52 Pain, unspecified: Secondary | ICD-10-CM | POA: Diagnosis not present

## 2023-08-09 DIAGNOSIS — D689 Coagulation defect, unspecified: Secondary | ICD-10-CM | POA: Diagnosis not present

## 2023-08-09 DIAGNOSIS — N2581 Secondary hyperparathyroidism of renal origin: Secondary | ICD-10-CM | POA: Diagnosis not present

## 2023-08-09 DIAGNOSIS — Z992 Dependence on renal dialysis: Secondary | ICD-10-CM | POA: Diagnosis not present

## 2023-08-09 DIAGNOSIS — N186 End stage renal disease: Secondary | ICD-10-CM | POA: Diagnosis not present

## 2023-08-09 DIAGNOSIS — D631 Anemia in chronic kidney disease: Secondary | ICD-10-CM | POA: Diagnosis not present

## 2023-08-09 DIAGNOSIS — E039 Hypothyroidism, unspecified: Secondary | ICD-10-CM | POA: Diagnosis not present

## 2023-08-09 DIAGNOSIS — R52 Pain, unspecified: Secondary | ICD-10-CM | POA: Diagnosis not present

## 2023-08-11 DIAGNOSIS — F4323 Adjustment disorder with mixed anxiety and depressed mood: Secondary | ICD-10-CM | POA: Diagnosis not present

## 2023-08-12 DIAGNOSIS — N2581 Secondary hyperparathyroidism of renal origin: Secondary | ICD-10-CM | POA: Diagnosis not present

## 2023-08-12 DIAGNOSIS — Z992 Dependence on renal dialysis: Secondary | ICD-10-CM | POA: Diagnosis not present

## 2023-08-12 DIAGNOSIS — E039 Hypothyroidism, unspecified: Secondary | ICD-10-CM | POA: Diagnosis not present

## 2023-08-12 DIAGNOSIS — D631 Anemia in chronic kidney disease: Secondary | ICD-10-CM | POA: Diagnosis not present

## 2023-08-12 DIAGNOSIS — N186 End stage renal disease: Secondary | ICD-10-CM | POA: Diagnosis not present

## 2023-08-12 DIAGNOSIS — D689 Coagulation defect, unspecified: Secondary | ICD-10-CM | POA: Diagnosis not present

## 2023-08-12 DIAGNOSIS — R52 Pain, unspecified: Secondary | ICD-10-CM | POA: Diagnosis not present

## 2023-08-13 DIAGNOSIS — M25512 Pain in left shoulder: Secondary | ICD-10-CM | POA: Diagnosis not present

## 2023-08-13 DIAGNOSIS — M1120 Other chondrocalcinosis, unspecified site: Secondary | ICD-10-CM | POA: Diagnosis not present

## 2023-08-14 DIAGNOSIS — D631 Anemia in chronic kidney disease: Secondary | ICD-10-CM | POA: Diagnosis not present

## 2023-08-14 DIAGNOSIS — N186 End stage renal disease: Secondary | ICD-10-CM | POA: Diagnosis not present

## 2023-08-14 DIAGNOSIS — Z992 Dependence on renal dialysis: Secondary | ICD-10-CM | POA: Diagnosis not present

## 2023-08-14 DIAGNOSIS — E039 Hypothyroidism, unspecified: Secondary | ICD-10-CM | POA: Diagnosis not present

## 2023-08-14 DIAGNOSIS — N2581 Secondary hyperparathyroidism of renal origin: Secondary | ICD-10-CM | POA: Diagnosis not present

## 2023-08-14 DIAGNOSIS — R52 Pain, unspecified: Secondary | ICD-10-CM | POA: Diagnosis not present

## 2023-08-14 DIAGNOSIS — D689 Coagulation defect, unspecified: Secondary | ICD-10-CM | POA: Diagnosis not present

## 2023-08-16 DIAGNOSIS — N186 End stage renal disease: Secondary | ICD-10-CM | POA: Diagnosis not present

## 2023-08-16 DIAGNOSIS — R52 Pain, unspecified: Secondary | ICD-10-CM | POA: Diagnosis not present

## 2023-08-16 DIAGNOSIS — D631 Anemia in chronic kidney disease: Secondary | ICD-10-CM | POA: Diagnosis not present

## 2023-08-16 DIAGNOSIS — E039 Hypothyroidism, unspecified: Secondary | ICD-10-CM | POA: Diagnosis not present

## 2023-08-16 DIAGNOSIS — D689 Coagulation defect, unspecified: Secondary | ICD-10-CM | POA: Diagnosis not present

## 2023-08-16 DIAGNOSIS — N2581 Secondary hyperparathyroidism of renal origin: Secondary | ICD-10-CM | POA: Diagnosis not present

## 2023-08-16 DIAGNOSIS — Z992 Dependence on renal dialysis: Secondary | ICD-10-CM | POA: Diagnosis not present

## 2023-08-18 DIAGNOSIS — Z992 Dependence on renal dialysis: Secondary | ICD-10-CM | POA: Diagnosis not present

## 2023-08-18 DIAGNOSIS — N186 End stage renal disease: Secondary | ICD-10-CM | POA: Diagnosis not present

## 2023-08-18 DIAGNOSIS — N2581 Secondary hyperparathyroidism of renal origin: Secondary | ICD-10-CM | POA: Diagnosis not present

## 2023-08-18 DIAGNOSIS — E039 Hypothyroidism, unspecified: Secondary | ICD-10-CM | POA: Diagnosis not present

## 2023-08-18 DIAGNOSIS — D631 Anemia in chronic kidney disease: Secondary | ICD-10-CM | POA: Diagnosis not present

## 2023-08-18 DIAGNOSIS — R52 Pain, unspecified: Secondary | ICD-10-CM | POA: Diagnosis not present

## 2023-08-18 DIAGNOSIS — D689 Coagulation defect, unspecified: Secondary | ICD-10-CM | POA: Diagnosis not present

## 2023-08-20 DIAGNOSIS — Z992 Dependence on renal dialysis: Secondary | ICD-10-CM | POA: Diagnosis not present

## 2023-08-20 DIAGNOSIS — N2581 Secondary hyperparathyroidism of renal origin: Secondary | ICD-10-CM | POA: Diagnosis not present

## 2023-08-20 DIAGNOSIS — D631 Anemia in chronic kidney disease: Secondary | ICD-10-CM | POA: Diagnosis not present

## 2023-08-20 DIAGNOSIS — D689 Coagulation defect, unspecified: Secondary | ICD-10-CM | POA: Diagnosis not present

## 2023-08-20 DIAGNOSIS — R52 Pain, unspecified: Secondary | ICD-10-CM | POA: Diagnosis not present

## 2023-08-20 DIAGNOSIS — N186 End stage renal disease: Secondary | ICD-10-CM | POA: Diagnosis not present

## 2023-08-20 DIAGNOSIS — E039 Hypothyroidism, unspecified: Secondary | ICD-10-CM | POA: Diagnosis not present

## 2023-08-23 DIAGNOSIS — D631 Anemia in chronic kidney disease: Secondary | ICD-10-CM | POA: Diagnosis not present

## 2023-08-23 DIAGNOSIS — Z992 Dependence on renal dialysis: Secondary | ICD-10-CM | POA: Diagnosis not present

## 2023-08-23 DIAGNOSIS — D689 Coagulation defect, unspecified: Secondary | ICD-10-CM | POA: Diagnosis not present

## 2023-08-23 DIAGNOSIS — E039 Hypothyroidism, unspecified: Secondary | ICD-10-CM | POA: Diagnosis not present

## 2023-08-23 DIAGNOSIS — T8612 Kidney transplant failure: Secondary | ICD-10-CM | POA: Diagnosis not present

## 2023-08-23 DIAGNOSIS — R52 Pain, unspecified: Secondary | ICD-10-CM | POA: Diagnosis not present

## 2023-08-23 DIAGNOSIS — N2581 Secondary hyperparathyroidism of renal origin: Secondary | ICD-10-CM | POA: Diagnosis not present

## 2023-08-23 DIAGNOSIS — N186 End stage renal disease: Secondary | ICD-10-CM | POA: Diagnosis not present

## 2023-08-26 DIAGNOSIS — D631 Anemia in chronic kidney disease: Secondary | ICD-10-CM | POA: Diagnosis not present

## 2023-08-26 DIAGNOSIS — D689 Coagulation defect, unspecified: Secondary | ICD-10-CM | POA: Diagnosis not present

## 2023-08-26 DIAGNOSIS — E039 Hypothyroidism, unspecified: Secondary | ICD-10-CM | POA: Diagnosis not present

## 2023-08-26 DIAGNOSIS — N2581 Secondary hyperparathyroidism of renal origin: Secondary | ICD-10-CM | POA: Diagnosis not present

## 2023-08-26 DIAGNOSIS — N186 End stage renal disease: Secondary | ICD-10-CM | POA: Diagnosis not present

## 2023-08-26 DIAGNOSIS — Z111 Encounter for screening for respiratory tuberculosis: Secondary | ICD-10-CM | POA: Diagnosis not present

## 2023-08-26 DIAGNOSIS — Z992 Dependence on renal dialysis: Secondary | ICD-10-CM | POA: Diagnosis not present

## 2023-08-28 DIAGNOSIS — Z111 Encounter for screening for respiratory tuberculosis: Secondary | ICD-10-CM | POA: Diagnosis not present

## 2023-08-28 DIAGNOSIS — Z992 Dependence on renal dialysis: Secondary | ICD-10-CM | POA: Diagnosis not present

## 2023-08-28 DIAGNOSIS — N2581 Secondary hyperparathyroidism of renal origin: Secondary | ICD-10-CM | POA: Diagnosis not present

## 2023-08-28 DIAGNOSIS — N186 End stage renal disease: Secondary | ICD-10-CM | POA: Diagnosis not present

## 2023-08-28 DIAGNOSIS — D689 Coagulation defect, unspecified: Secondary | ICD-10-CM | POA: Diagnosis not present

## 2023-08-28 DIAGNOSIS — E039 Hypothyroidism, unspecified: Secondary | ICD-10-CM | POA: Diagnosis not present

## 2023-08-28 DIAGNOSIS — D631 Anemia in chronic kidney disease: Secondary | ICD-10-CM | POA: Diagnosis not present

## 2023-08-30 DIAGNOSIS — E039 Hypothyroidism, unspecified: Secondary | ICD-10-CM | POA: Diagnosis not present

## 2023-08-30 DIAGNOSIS — D631 Anemia in chronic kidney disease: Secondary | ICD-10-CM | POA: Diagnosis not present

## 2023-08-30 DIAGNOSIS — Z992 Dependence on renal dialysis: Secondary | ICD-10-CM | POA: Diagnosis not present

## 2023-08-30 DIAGNOSIS — Z111 Encounter for screening for respiratory tuberculosis: Secondary | ICD-10-CM | POA: Diagnosis not present

## 2023-08-30 DIAGNOSIS — N186 End stage renal disease: Secondary | ICD-10-CM | POA: Diagnosis not present

## 2023-08-30 DIAGNOSIS — D689 Coagulation defect, unspecified: Secondary | ICD-10-CM | POA: Diagnosis not present

## 2023-08-30 DIAGNOSIS — N2581 Secondary hyperparathyroidism of renal origin: Secondary | ICD-10-CM | POA: Diagnosis not present

## 2023-09-02 DIAGNOSIS — Z111 Encounter for screening for respiratory tuberculosis: Secondary | ICD-10-CM | POA: Diagnosis not present

## 2023-09-02 DIAGNOSIS — N186 End stage renal disease: Secondary | ICD-10-CM | POA: Diagnosis not present

## 2023-09-02 DIAGNOSIS — Z992 Dependence on renal dialysis: Secondary | ICD-10-CM | POA: Diagnosis not present

## 2023-09-02 DIAGNOSIS — D631 Anemia in chronic kidney disease: Secondary | ICD-10-CM | POA: Diagnosis not present

## 2023-09-02 DIAGNOSIS — N2581 Secondary hyperparathyroidism of renal origin: Secondary | ICD-10-CM | POA: Diagnosis not present

## 2023-09-02 DIAGNOSIS — D689 Coagulation defect, unspecified: Secondary | ICD-10-CM | POA: Diagnosis not present

## 2023-09-02 DIAGNOSIS — E039 Hypothyroidism, unspecified: Secondary | ICD-10-CM | POA: Diagnosis not present

## 2023-09-04 DIAGNOSIS — N186 End stage renal disease: Secondary | ICD-10-CM | POA: Diagnosis not present

## 2023-09-04 DIAGNOSIS — N2581 Secondary hyperparathyroidism of renal origin: Secondary | ICD-10-CM | POA: Diagnosis not present

## 2023-09-04 DIAGNOSIS — E039 Hypothyroidism, unspecified: Secondary | ICD-10-CM | POA: Diagnosis not present

## 2023-09-04 DIAGNOSIS — D689 Coagulation defect, unspecified: Secondary | ICD-10-CM | POA: Diagnosis not present

## 2023-09-04 DIAGNOSIS — Z111 Encounter for screening for respiratory tuberculosis: Secondary | ICD-10-CM | POA: Diagnosis not present

## 2023-09-04 DIAGNOSIS — Z992 Dependence on renal dialysis: Secondary | ICD-10-CM | POA: Diagnosis not present

## 2023-09-04 DIAGNOSIS — D631 Anemia in chronic kidney disease: Secondary | ICD-10-CM | POA: Diagnosis not present

## 2023-09-06 DIAGNOSIS — N186 End stage renal disease: Secondary | ICD-10-CM | POA: Diagnosis not present

## 2023-09-06 DIAGNOSIS — N2581 Secondary hyperparathyroidism of renal origin: Secondary | ICD-10-CM | POA: Diagnosis not present

## 2023-09-06 DIAGNOSIS — E039 Hypothyroidism, unspecified: Secondary | ICD-10-CM | POA: Diagnosis not present

## 2023-09-06 DIAGNOSIS — Z992 Dependence on renal dialysis: Secondary | ICD-10-CM | POA: Diagnosis not present

## 2023-09-06 DIAGNOSIS — D631 Anemia in chronic kidney disease: Secondary | ICD-10-CM | POA: Diagnosis not present

## 2023-09-06 DIAGNOSIS — D689 Coagulation defect, unspecified: Secondary | ICD-10-CM | POA: Diagnosis not present

## 2023-09-06 DIAGNOSIS — Z111 Encounter for screening for respiratory tuberculosis: Secondary | ICD-10-CM | POA: Diagnosis not present

## 2023-09-08 DIAGNOSIS — I671 Cerebral aneurysm, nonruptured: Secondary | ICD-10-CM | POA: Diagnosis not present

## 2023-09-08 DIAGNOSIS — M069 Rheumatoid arthritis, unspecified: Secondary | ICD-10-CM | POA: Diagnosis not present

## 2023-09-08 DIAGNOSIS — D84821 Immunodeficiency due to drugs: Secondary | ICD-10-CM | POA: Diagnosis not present

## 2023-09-08 DIAGNOSIS — M5459 Other low back pain: Secondary | ICD-10-CM | POA: Diagnosis not present

## 2023-09-08 DIAGNOSIS — M25512 Pain in left shoulder: Secondary | ICD-10-CM | POA: Diagnosis not present

## 2023-09-08 DIAGNOSIS — E46 Unspecified protein-calorie malnutrition: Secondary | ICD-10-CM | POA: Diagnosis not present

## 2023-09-09 ENCOUNTER — Other Ambulatory Visit: Payer: Self-pay | Admitting: Physician Assistant

## 2023-09-09 DIAGNOSIS — Z992 Dependence on renal dialysis: Secondary | ICD-10-CM | POA: Diagnosis not present

## 2023-09-09 DIAGNOSIS — D689 Coagulation defect, unspecified: Secondary | ICD-10-CM | POA: Diagnosis not present

## 2023-09-09 DIAGNOSIS — N186 End stage renal disease: Secondary | ICD-10-CM | POA: Diagnosis not present

## 2023-09-09 DIAGNOSIS — D631 Anemia in chronic kidney disease: Secondary | ICD-10-CM | POA: Diagnosis not present

## 2023-09-09 DIAGNOSIS — N2581 Secondary hyperparathyroidism of renal origin: Secondary | ICD-10-CM | POA: Diagnosis not present

## 2023-09-09 DIAGNOSIS — E039 Hypothyroidism, unspecified: Secondary | ICD-10-CM | POA: Diagnosis not present

## 2023-09-09 DIAGNOSIS — Z111 Encounter for screening for respiratory tuberculosis: Secondary | ICD-10-CM | POA: Diagnosis not present

## 2023-09-09 DIAGNOSIS — Z8673 Personal history of transient ischemic attack (TIA), and cerebral infarction without residual deficits: Secondary | ICD-10-CM

## 2023-09-09 DIAGNOSIS — I671 Cerebral aneurysm, nonruptured: Secondary | ICD-10-CM

## 2023-09-11 ENCOUNTER — Other Ambulatory Visit: Payer: Self-pay | Admitting: Pulmonary Disease

## 2023-09-13 DIAGNOSIS — D631 Anemia in chronic kidney disease: Secondary | ICD-10-CM | POA: Diagnosis not present

## 2023-09-13 DIAGNOSIS — Z992 Dependence on renal dialysis: Secondary | ICD-10-CM | POA: Diagnosis not present

## 2023-09-13 DIAGNOSIS — D689 Coagulation defect, unspecified: Secondary | ICD-10-CM | POA: Diagnosis not present

## 2023-09-13 DIAGNOSIS — N2581 Secondary hyperparathyroidism of renal origin: Secondary | ICD-10-CM | POA: Diagnosis not present

## 2023-09-13 DIAGNOSIS — E039 Hypothyroidism, unspecified: Secondary | ICD-10-CM | POA: Diagnosis not present

## 2023-09-13 DIAGNOSIS — N186 End stage renal disease: Secondary | ICD-10-CM | POA: Diagnosis not present

## 2023-09-13 DIAGNOSIS — Z111 Encounter for screening for respiratory tuberculosis: Secondary | ICD-10-CM | POA: Diagnosis not present

## 2023-09-15 DIAGNOSIS — Z992 Dependence on renal dialysis: Secondary | ICD-10-CM | POA: Diagnosis not present

## 2023-09-15 DIAGNOSIS — N186 End stage renal disease: Secondary | ICD-10-CM | POA: Diagnosis not present

## 2023-09-18 DIAGNOSIS — N186 End stage renal disease: Secondary | ICD-10-CM | POA: Diagnosis not present

## 2023-09-18 DIAGNOSIS — Z992 Dependence on renal dialysis: Secondary | ICD-10-CM | POA: Diagnosis not present

## 2023-09-22 ENCOUNTER — Encounter: Payer: Self-pay | Admitting: Physician Assistant

## 2023-09-22 DIAGNOSIS — N186 End stage renal disease: Secondary | ICD-10-CM | POA: Diagnosis not present

## 2023-09-22 DIAGNOSIS — Z992 Dependence on renal dialysis: Secondary | ICD-10-CM | POA: Diagnosis not present

## 2023-09-22 DIAGNOSIS — Z111 Encounter for screening for respiratory tuberculosis: Secondary | ICD-10-CM | POA: Diagnosis not present

## 2023-09-22 DIAGNOSIS — D631 Anemia in chronic kidney disease: Secondary | ICD-10-CM | POA: Diagnosis not present

## 2023-09-22 DIAGNOSIS — E039 Hypothyroidism, unspecified: Secondary | ICD-10-CM | POA: Diagnosis not present

## 2023-09-22 DIAGNOSIS — N2581 Secondary hyperparathyroidism of renal origin: Secondary | ICD-10-CM | POA: Diagnosis not present

## 2023-09-22 DIAGNOSIS — D689 Coagulation defect, unspecified: Secondary | ICD-10-CM | POA: Diagnosis not present

## 2023-09-23 DIAGNOSIS — Z992 Dependence on renal dialysis: Secondary | ICD-10-CM | POA: Diagnosis not present

## 2023-09-23 DIAGNOSIS — T8612 Kidney transplant failure: Secondary | ICD-10-CM | POA: Diagnosis not present

## 2023-09-23 DIAGNOSIS — N186 End stage renal disease: Secondary | ICD-10-CM | POA: Diagnosis not present

## 2023-09-25 DIAGNOSIS — N2581 Secondary hyperparathyroidism of renal origin: Secondary | ICD-10-CM | POA: Diagnosis not present

## 2023-09-25 DIAGNOSIS — N186 End stage renal disease: Secondary | ICD-10-CM | POA: Diagnosis not present

## 2023-09-25 DIAGNOSIS — E1129 Type 2 diabetes mellitus with other diabetic kidney complication: Secondary | ICD-10-CM | POA: Diagnosis not present

## 2023-09-25 DIAGNOSIS — D689 Coagulation defect, unspecified: Secondary | ICD-10-CM | POA: Diagnosis not present

## 2023-09-25 DIAGNOSIS — E039 Hypothyroidism, unspecified: Secondary | ICD-10-CM | POA: Diagnosis not present

## 2023-09-25 DIAGNOSIS — Z992 Dependence on renal dialysis: Secondary | ICD-10-CM | POA: Diagnosis not present

## 2023-09-25 DIAGNOSIS — D631 Anemia in chronic kidney disease: Secondary | ICD-10-CM | POA: Diagnosis not present

## 2023-09-26 ENCOUNTER — Ambulatory Visit
Admission: RE | Admit: 2023-09-26 | Discharge: 2023-09-26 | Disposition: A | Discharge status: Home / Self Care | Payer: Medicare Other | Source: Ambulatory Visit | Attending: Physician Assistant | Admitting: Physician Assistant

## 2023-09-26 DIAGNOSIS — I671 Cerebral aneurysm, nonruptured: Secondary | ICD-10-CM | POA: Diagnosis not present

## 2023-09-26 DIAGNOSIS — Z8673 Personal history of transient ischemic attack (TIA), and cerebral infarction without residual deficits: Secondary | ICD-10-CM

## 2023-09-27 DIAGNOSIS — D689 Coagulation defect, unspecified: Secondary | ICD-10-CM | POA: Diagnosis not present

## 2023-09-27 DIAGNOSIS — N2581 Secondary hyperparathyroidism of renal origin: Secondary | ICD-10-CM | POA: Diagnosis not present

## 2023-09-27 DIAGNOSIS — E1129 Type 2 diabetes mellitus with other diabetic kidney complication: Secondary | ICD-10-CM | POA: Diagnosis not present

## 2023-09-27 DIAGNOSIS — E039 Hypothyroidism, unspecified: Secondary | ICD-10-CM | POA: Diagnosis not present

## 2023-09-27 DIAGNOSIS — Z992 Dependence on renal dialysis: Secondary | ICD-10-CM | POA: Diagnosis not present

## 2023-09-27 DIAGNOSIS — D631 Anemia in chronic kidney disease: Secondary | ICD-10-CM | POA: Diagnosis not present

## 2023-09-27 DIAGNOSIS — N186 End stage renal disease: Secondary | ICD-10-CM | POA: Diagnosis not present

## 2023-09-29 DIAGNOSIS — R2689 Other abnormalities of gait and mobility: Secondary | ICD-10-CM | POA: Diagnosis not present

## 2023-09-29 DIAGNOSIS — M159 Polyosteoarthritis, unspecified: Secondary | ICD-10-CM | POA: Diagnosis not present

## 2023-09-29 DIAGNOSIS — M6281 Muscle weakness (generalized): Secondary | ICD-10-CM | POA: Diagnosis not present

## 2023-09-29 DIAGNOSIS — M818 Other osteoporosis without current pathological fracture: Secondary | ICD-10-CM | POA: Diagnosis not present

## 2023-09-30 DIAGNOSIS — D631 Anemia in chronic kidney disease: Secondary | ICD-10-CM | POA: Diagnosis not present

## 2023-09-30 DIAGNOSIS — Z992 Dependence on renal dialysis: Secondary | ICD-10-CM | POA: Diagnosis not present

## 2023-09-30 DIAGNOSIS — E1129 Type 2 diabetes mellitus with other diabetic kidney complication: Secondary | ICD-10-CM | POA: Diagnosis not present

## 2023-09-30 DIAGNOSIS — D689 Coagulation defect, unspecified: Secondary | ICD-10-CM | POA: Diagnosis not present

## 2023-09-30 DIAGNOSIS — N186 End stage renal disease: Secondary | ICD-10-CM | POA: Diagnosis not present

## 2023-09-30 DIAGNOSIS — N2581 Secondary hyperparathyroidism of renal origin: Secondary | ICD-10-CM | POA: Diagnosis not present

## 2023-09-30 DIAGNOSIS — E039 Hypothyroidism, unspecified: Secondary | ICD-10-CM | POA: Diagnosis not present

## 2023-10-03 DIAGNOSIS — M818 Other osteoporosis without current pathological fracture: Secondary | ICD-10-CM | POA: Diagnosis not present

## 2023-10-03 DIAGNOSIS — R2689 Other abnormalities of gait and mobility: Secondary | ICD-10-CM | POA: Diagnosis not present

## 2023-10-03 DIAGNOSIS — M159 Polyosteoarthritis, unspecified: Secondary | ICD-10-CM | POA: Diagnosis not present

## 2023-10-03 DIAGNOSIS — M6281 Muscle weakness (generalized): Secondary | ICD-10-CM | POA: Diagnosis not present

## 2023-10-04 DIAGNOSIS — N186 End stage renal disease: Secondary | ICD-10-CM | POA: Diagnosis not present

## 2023-10-04 DIAGNOSIS — E1129 Type 2 diabetes mellitus with other diabetic kidney complication: Secondary | ICD-10-CM | POA: Diagnosis not present

## 2023-10-04 DIAGNOSIS — Z992 Dependence on renal dialysis: Secondary | ICD-10-CM | POA: Diagnosis not present

## 2023-10-04 DIAGNOSIS — D631 Anemia in chronic kidney disease: Secondary | ICD-10-CM | POA: Diagnosis not present

## 2023-10-04 DIAGNOSIS — E039 Hypothyroidism, unspecified: Secondary | ICD-10-CM | POA: Diagnosis not present

## 2023-10-04 DIAGNOSIS — D689 Coagulation defect, unspecified: Secondary | ICD-10-CM | POA: Diagnosis not present

## 2023-10-04 DIAGNOSIS — N2581 Secondary hyperparathyroidism of renal origin: Secondary | ICD-10-CM | POA: Diagnosis not present

## 2023-10-06 DIAGNOSIS — M818 Other osteoporosis without current pathological fracture: Secondary | ICD-10-CM | POA: Diagnosis not present

## 2023-10-06 DIAGNOSIS — R2689 Other abnormalities of gait and mobility: Secondary | ICD-10-CM | POA: Diagnosis not present

## 2023-10-06 DIAGNOSIS — M6281 Muscle weakness (generalized): Secondary | ICD-10-CM | POA: Diagnosis not present

## 2023-10-06 DIAGNOSIS — M159 Polyosteoarthritis, unspecified: Secondary | ICD-10-CM | POA: Diagnosis not present

## 2023-10-07 DIAGNOSIS — Z992 Dependence on renal dialysis: Secondary | ICD-10-CM | POA: Diagnosis not present

## 2023-10-07 DIAGNOSIS — N186 End stage renal disease: Secondary | ICD-10-CM | POA: Diagnosis not present

## 2023-10-07 DIAGNOSIS — D689 Coagulation defect, unspecified: Secondary | ICD-10-CM | POA: Diagnosis not present

## 2023-10-07 DIAGNOSIS — N2581 Secondary hyperparathyroidism of renal origin: Secondary | ICD-10-CM | POA: Diagnosis not present

## 2023-10-07 DIAGNOSIS — E039 Hypothyroidism, unspecified: Secondary | ICD-10-CM | POA: Diagnosis not present

## 2023-10-07 DIAGNOSIS — D631 Anemia in chronic kidney disease: Secondary | ICD-10-CM | POA: Diagnosis not present

## 2023-10-07 DIAGNOSIS — E1129 Type 2 diabetes mellitus with other diabetic kidney complication: Secondary | ICD-10-CM | POA: Diagnosis not present

## 2023-10-09 DIAGNOSIS — E039 Hypothyroidism, unspecified: Secondary | ICD-10-CM | POA: Diagnosis not present

## 2023-10-09 DIAGNOSIS — D631 Anemia in chronic kidney disease: Secondary | ICD-10-CM | POA: Diagnosis not present

## 2023-10-09 DIAGNOSIS — N2581 Secondary hyperparathyroidism of renal origin: Secondary | ICD-10-CM | POA: Diagnosis not present

## 2023-10-09 DIAGNOSIS — Z992 Dependence on renal dialysis: Secondary | ICD-10-CM | POA: Diagnosis not present

## 2023-10-09 DIAGNOSIS — E1129 Type 2 diabetes mellitus with other diabetic kidney complication: Secondary | ICD-10-CM | POA: Diagnosis not present

## 2023-10-09 DIAGNOSIS — N186 End stage renal disease: Secondary | ICD-10-CM | POA: Diagnosis not present

## 2023-10-09 DIAGNOSIS — D689 Coagulation defect, unspecified: Secondary | ICD-10-CM | POA: Diagnosis not present

## 2023-10-10 DIAGNOSIS — R2689 Other abnormalities of gait and mobility: Secondary | ICD-10-CM | POA: Diagnosis not present

## 2023-10-10 DIAGNOSIS — M159 Polyosteoarthritis, unspecified: Secondary | ICD-10-CM | POA: Diagnosis not present

## 2023-10-10 DIAGNOSIS — M6281 Muscle weakness (generalized): Secondary | ICD-10-CM | POA: Diagnosis not present

## 2023-10-10 DIAGNOSIS — M818 Other osteoporosis without current pathological fracture: Secondary | ICD-10-CM | POA: Diagnosis not present

## 2023-10-11 DIAGNOSIS — Z992 Dependence on renal dialysis: Secondary | ICD-10-CM | POA: Diagnosis not present

## 2023-10-11 DIAGNOSIS — E1129 Type 2 diabetes mellitus with other diabetic kidney complication: Secondary | ICD-10-CM | POA: Diagnosis not present

## 2023-10-11 DIAGNOSIS — D631 Anemia in chronic kidney disease: Secondary | ICD-10-CM | POA: Diagnosis not present

## 2023-10-11 DIAGNOSIS — D689 Coagulation defect, unspecified: Secondary | ICD-10-CM | POA: Diagnosis not present

## 2023-10-11 DIAGNOSIS — N2581 Secondary hyperparathyroidism of renal origin: Secondary | ICD-10-CM | POA: Diagnosis not present

## 2023-10-11 DIAGNOSIS — E039 Hypothyroidism, unspecified: Secondary | ICD-10-CM | POA: Diagnosis not present

## 2023-10-11 DIAGNOSIS — N186 End stage renal disease: Secondary | ICD-10-CM | POA: Diagnosis not present

## 2023-10-13 DIAGNOSIS — M6281 Muscle weakness (generalized): Secondary | ICD-10-CM | POA: Diagnosis not present

## 2023-10-13 DIAGNOSIS — M159 Polyosteoarthritis, unspecified: Secondary | ICD-10-CM | POA: Diagnosis not present

## 2023-10-13 DIAGNOSIS — R2689 Other abnormalities of gait and mobility: Secondary | ICD-10-CM | POA: Diagnosis not present

## 2023-10-13 DIAGNOSIS — M818 Other osteoporosis without current pathological fracture: Secondary | ICD-10-CM | POA: Diagnosis not present

## 2023-10-14 DIAGNOSIS — Z992 Dependence on renal dialysis: Secondary | ICD-10-CM | POA: Diagnosis not present

## 2023-10-14 DIAGNOSIS — N2581 Secondary hyperparathyroidism of renal origin: Secondary | ICD-10-CM | POA: Diagnosis not present

## 2023-10-14 DIAGNOSIS — D689 Coagulation defect, unspecified: Secondary | ICD-10-CM | POA: Diagnosis not present

## 2023-10-14 DIAGNOSIS — E039 Hypothyroidism, unspecified: Secondary | ICD-10-CM | POA: Diagnosis not present

## 2023-10-14 DIAGNOSIS — N186 End stage renal disease: Secondary | ICD-10-CM | POA: Diagnosis not present

## 2023-10-14 DIAGNOSIS — E1129 Type 2 diabetes mellitus with other diabetic kidney complication: Secondary | ICD-10-CM | POA: Diagnosis not present

## 2023-10-14 DIAGNOSIS — D631 Anemia in chronic kidney disease: Secondary | ICD-10-CM | POA: Diagnosis not present

## 2023-10-16 DIAGNOSIS — E039 Hypothyroidism, unspecified: Secondary | ICD-10-CM | POA: Diagnosis not present

## 2023-10-16 DIAGNOSIS — N2581 Secondary hyperparathyroidism of renal origin: Secondary | ICD-10-CM | POA: Diagnosis not present

## 2023-10-16 DIAGNOSIS — D631 Anemia in chronic kidney disease: Secondary | ICD-10-CM | POA: Diagnosis not present

## 2023-10-16 DIAGNOSIS — E1129 Type 2 diabetes mellitus with other diabetic kidney complication: Secondary | ICD-10-CM | POA: Diagnosis not present

## 2023-10-16 DIAGNOSIS — N186 End stage renal disease: Secondary | ICD-10-CM | POA: Diagnosis not present

## 2023-10-16 DIAGNOSIS — D689 Coagulation defect, unspecified: Secondary | ICD-10-CM | POA: Diagnosis not present

## 2023-10-16 DIAGNOSIS — Z992 Dependence on renal dialysis: Secondary | ICD-10-CM | POA: Diagnosis not present

## 2023-10-17 ENCOUNTER — Other Ambulatory Visit: Payer: Self-pay | Admitting: Pulmonary Disease

## 2023-10-18 DIAGNOSIS — D689 Coagulation defect, unspecified: Secondary | ICD-10-CM | POA: Diagnosis not present

## 2023-10-18 DIAGNOSIS — E039 Hypothyroidism, unspecified: Secondary | ICD-10-CM | POA: Diagnosis not present

## 2023-10-18 DIAGNOSIS — Z992 Dependence on renal dialysis: Secondary | ICD-10-CM | POA: Diagnosis not present

## 2023-10-18 DIAGNOSIS — D631 Anemia in chronic kidney disease: Secondary | ICD-10-CM | POA: Diagnosis not present

## 2023-10-18 DIAGNOSIS — E1129 Type 2 diabetes mellitus with other diabetic kidney complication: Secondary | ICD-10-CM | POA: Diagnosis not present

## 2023-10-18 DIAGNOSIS — N2581 Secondary hyperparathyroidism of renal origin: Secondary | ICD-10-CM | POA: Diagnosis not present

## 2023-10-18 DIAGNOSIS — N186 End stage renal disease: Secondary | ICD-10-CM | POA: Diagnosis not present

## 2023-10-21 DIAGNOSIS — E1129 Type 2 diabetes mellitus with other diabetic kidney complication: Secondary | ICD-10-CM | POA: Diagnosis not present

## 2023-10-21 DIAGNOSIS — E039 Hypothyroidism, unspecified: Secondary | ICD-10-CM | POA: Diagnosis not present

## 2023-10-21 DIAGNOSIS — N2581 Secondary hyperparathyroidism of renal origin: Secondary | ICD-10-CM | POA: Diagnosis not present

## 2023-10-21 DIAGNOSIS — D631 Anemia in chronic kidney disease: Secondary | ICD-10-CM | POA: Diagnosis not present

## 2023-10-21 DIAGNOSIS — D689 Coagulation defect, unspecified: Secondary | ICD-10-CM | POA: Diagnosis not present

## 2023-10-21 DIAGNOSIS — N186 End stage renal disease: Secondary | ICD-10-CM | POA: Diagnosis not present

## 2023-10-21 DIAGNOSIS — Z992 Dependence on renal dialysis: Secondary | ICD-10-CM | POA: Diagnosis not present

## 2023-10-23 DIAGNOSIS — E039 Hypothyroidism, unspecified: Secondary | ICD-10-CM | POA: Diagnosis not present

## 2023-10-23 DIAGNOSIS — N2581 Secondary hyperparathyroidism of renal origin: Secondary | ICD-10-CM | POA: Diagnosis not present

## 2023-10-23 DIAGNOSIS — Z992 Dependence on renal dialysis: Secondary | ICD-10-CM | POA: Diagnosis not present

## 2023-10-23 DIAGNOSIS — D689 Coagulation defect, unspecified: Secondary | ICD-10-CM | POA: Diagnosis not present

## 2023-10-23 DIAGNOSIS — D631 Anemia in chronic kidney disease: Secondary | ICD-10-CM | POA: Diagnosis not present

## 2023-10-23 DIAGNOSIS — N186 End stage renal disease: Secondary | ICD-10-CM | POA: Diagnosis not present

## 2023-10-23 DIAGNOSIS — E1129 Type 2 diabetes mellitus with other diabetic kidney complication: Secondary | ICD-10-CM | POA: Diagnosis not present

## 2023-10-24 DIAGNOSIS — Z992 Dependence on renal dialysis: Secondary | ICD-10-CM | POA: Diagnosis not present

## 2023-10-24 DIAGNOSIS — T8612 Kidney transplant failure: Secondary | ICD-10-CM | POA: Diagnosis not present

## 2023-10-24 DIAGNOSIS — N186 End stage renal disease: Secondary | ICD-10-CM | POA: Diagnosis not present

## 2023-10-25 DIAGNOSIS — N186 End stage renal disease: Secondary | ICD-10-CM | POA: Diagnosis not present

## 2023-10-25 DIAGNOSIS — E039 Hypothyroidism, unspecified: Secondary | ICD-10-CM | POA: Diagnosis not present

## 2023-10-25 DIAGNOSIS — N2581 Secondary hyperparathyroidism of renal origin: Secondary | ICD-10-CM | POA: Diagnosis not present

## 2023-10-25 DIAGNOSIS — D631 Anemia in chronic kidney disease: Secondary | ICD-10-CM | POA: Diagnosis not present

## 2023-10-25 DIAGNOSIS — D689 Coagulation defect, unspecified: Secondary | ICD-10-CM | POA: Diagnosis not present

## 2023-10-25 DIAGNOSIS — Z992 Dependence on renal dialysis: Secondary | ICD-10-CM | POA: Diagnosis not present

## 2023-10-28 DIAGNOSIS — D689 Coagulation defect, unspecified: Secondary | ICD-10-CM | POA: Diagnosis not present

## 2023-10-28 DIAGNOSIS — Z992 Dependence on renal dialysis: Secondary | ICD-10-CM | POA: Diagnosis not present

## 2023-10-28 DIAGNOSIS — N2581 Secondary hyperparathyroidism of renal origin: Secondary | ICD-10-CM | POA: Diagnosis not present

## 2023-10-28 DIAGNOSIS — N186 End stage renal disease: Secondary | ICD-10-CM | POA: Diagnosis not present

## 2023-10-28 DIAGNOSIS — E039 Hypothyroidism, unspecified: Secondary | ICD-10-CM | POA: Diagnosis not present

## 2023-10-28 DIAGNOSIS — D631 Anemia in chronic kidney disease: Secondary | ICD-10-CM | POA: Diagnosis not present

## 2023-11-01 DIAGNOSIS — Z992 Dependence on renal dialysis: Secondary | ICD-10-CM | POA: Diagnosis not present

## 2023-11-01 DIAGNOSIS — N186 End stage renal disease: Secondary | ICD-10-CM | POA: Diagnosis not present

## 2023-11-01 DIAGNOSIS — D631 Anemia in chronic kidney disease: Secondary | ICD-10-CM | POA: Diagnosis not present

## 2023-11-01 DIAGNOSIS — N2581 Secondary hyperparathyroidism of renal origin: Secondary | ICD-10-CM | POA: Diagnosis not present

## 2023-11-01 DIAGNOSIS — D689 Coagulation defect, unspecified: Secondary | ICD-10-CM | POA: Diagnosis not present

## 2023-11-01 DIAGNOSIS — E039 Hypothyroidism, unspecified: Secondary | ICD-10-CM | POA: Diagnosis not present

## 2023-11-04 DIAGNOSIS — Z992 Dependence on renal dialysis: Secondary | ICD-10-CM | POA: Diagnosis not present

## 2023-11-04 DIAGNOSIS — N2581 Secondary hyperparathyroidism of renal origin: Secondary | ICD-10-CM | POA: Diagnosis not present

## 2023-11-04 DIAGNOSIS — N186 End stage renal disease: Secondary | ICD-10-CM | POA: Diagnosis not present

## 2023-11-04 DIAGNOSIS — D689 Coagulation defect, unspecified: Secondary | ICD-10-CM | POA: Diagnosis not present

## 2023-11-04 DIAGNOSIS — D631 Anemia in chronic kidney disease: Secondary | ICD-10-CM | POA: Diagnosis not present

## 2023-11-04 DIAGNOSIS — E039 Hypothyroidism, unspecified: Secondary | ICD-10-CM | POA: Diagnosis not present

## 2023-11-08 DIAGNOSIS — D631 Anemia in chronic kidney disease: Secondary | ICD-10-CM | POA: Diagnosis not present

## 2023-11-08 DIAGNOSIS — D689 Coagulation defect, unspecified: Secondary | ICD-10-CM | POA: Diagnosis not present

## 2023-11-08 DIAGNOSIS — N2581 Secondary hyperparathyroidism of renal origin: Secondary | ICD-10-CM | POA: Diagnosis not present

## 2023-11-08 DIAGNOSIS — E039 Hypothyroidism, unspecified: Secondary | ICD-10-CM | POA: Diagnosis not present

## 2023-11-08 DIAGNOSIS — Z992 Dependence on renal dialysis: Secondary | ICD-10-CM | POA: Diagnosis not present

## 2023-11-08 DIAGNOSIS — N186 End stage renal disease: Secondary | ICD-10-CM | POA: Diagnosis not present

## 2023-11-11 DIAGNOSIS — D689 Coagulation defect, unspecified: Secondary | ICD-10-CM | POA: Diagnosis not present

## 2023-11-11 DIAGNOSIS — E039 Hypothyroidism, unspecified: Secondary | ICD-10-CM | POA: Diagnosis not present

## 2023-11-11 DIAGNOSIS — N186 End stage renal disease: Secondary | ICD-10-CM | POA: Diagnosis not present

## 2023-11-11 DIAGNOSIS — Z992 Dependence on renal dialysis: Secondary | ICD-10-CM | POA: Diagnosis not present

## 2023-11-11 DIAGNOSIS — N2581 Secondary hyperparathyroidism of renal origin: Secondary | ICD-10-CM | POA: Diagnosis not present

## 2023-11-11 DIAGNOSIS — D631 Anemia in chronic kidney disease: Secondary | ICD-10-CM | POA: Diagnosis not present

## 2023-11-12 DIAGNOSIS — N186 End stage renal disease: Secondary | ICD-10-CM | POA: Diagnosis not present

## 2023-11-12 DIAGNOSIS — M0579 Rheumatoid arthritis with rheumatoid factor of multiple sites without organ or systems involvement: Secondary | ICD-10-CM | POA: Diagnosis not present

## 2023-11-12 DIAGNOSIS — M3501 Sicca syndrome with keratoconjunctivitis: Secondary | ICD-10-CM | POA: Diagnosis not present

## 2023-11-12 DIAGNOSIS — M1120 Other chondrocalcinosis, unspecified site: Secondary | ICD-10-CM | POA: Diagnosis not present

## 2023-11-13 DIAGNOSIS — N186 End stage renal disease: Secondary | ICD-10-CM | POA: Diagnosis not present

## 2023-11-13 DIAGNOSIS — E039 Hypothyroidism, unspecified: Secondary | ICD-10-CM | POA: Diagnosis not present

## 2023-11-13 DIAGNOSIS — N2581 Secondary hyperparathyroidism of renal origin: Secondary | ICD-10-CM | POA: Diagnosis not present

## 2023-11-13 DIAGNOSIS — D631 Anemia in chronic kidney disease: Secondary | ICD-10-CM | POA: Diagnosis not present

## 2023-11-13 DIAGNOSIS — Z992 Dependence on renal dialysis: Secondary | ICD-10-CM | POA: Diagnosis not present

## 2023-11-13 DIAGNOSIS — D689 Coagulation defect, unspecified: Secondary | ICD-10-CM | POA: Diagnosis not present

## 2023-11-14 ENCOUNTER — Telehealth: Payer: Self-pay

## 2023-11-14 ENCOUNTER — Telehealth: Payer: Self-pay | Admitting: *Deleted

## 2023-11-14 DIAGNOSIS — N2581 Secondary hyperparathyroidism of renal origin: Secondary | ICD-10-CM | POA: Diagnosis not present

## 2023-11-14 DIAGNOSIS — N186 End stage renal disease: Secondary | ICD-10-CM | POA: Diagnosis not present

## 2023-11-14 DIAGNOSIS — E039 Hypothyroidism, unspecified: Secondary | ICD-10-CM | POA: Diagnosis not present

## 2023-11-14 DIAGNOSIS — D689 Coagulation defect, unspecified: Secondary | ICD-10-CM | POA: Diagnosis not present

## 2023-11-14 DIAGNOSIS — Z992 Dependence on renal dialysis: Secondary | ICD-10-CM | POA: Diagnosis not present

## 2023-11-14 DIAGNOSIS — D631 Anemia in chronic kidney disease: Secondary | ICD-10-CM | POA: Diagnosis not present

## 2023-11-14 NOTE — Telephone Encounter (Signed)
Pt called requesting a fistulogram. She stated that she was under the impression this request had been faxed to [place] yesterday. I do not see any referral request sent for fistulogram. I informed her of this and she will reach back out to referring provider to refax.

## 2023-11-14 NOTE — Telephone Encounter (Signed)
Called pt for follow up on will call to schedule surgery from appointment in 11/29/2022 . LVM

## 2023-11-15 DIAGNOSIS — E039 Hypothyroidism, unspecified: Secondary | ICD-10-CM | POA: Diagnosis not present

## 2023-11-15 DIAGNOSIS — N2581 Secondary hyperparathyroidism of renal origin: Secondary | ICD-10-CM | POA: Diagnosis not present

## 2023-11-15 DIAGNOSIS — N186 End stage renal disease: Secondary | ICD-10-CM | POA: Diagnosis not present

## 2023-11-15 DIAGNOSIS — D689 Coagulation defect, unspecified: Secondary | ICD-10-CM | POA: Diagnosis not present

## 2023-11-15 DIAGNOSIS — D631 Anemia in chronic kidney disease: Secondary | ICD-10-CM | POA: Diagnosis not present

## 2023-11-15 DIAGNOSIS — Z992 Dependence on renal dialysis: Secondary | ICD-10-CM | POA: Diagnosis not present

## 2023-11-17 ENCOUNTER — Ambulatory Visit (HOSPITAL_COMMUNITY)
Admission: RE | Admit: 2023-11-17 | Discharge: 2023-11-17 | Disposition: A | Discharge status: Home / Self Care | Payer: Medicare Other | Attending: Nephrology | Admitting: Nephrology

## 2023-11-17 ENCOUNTER — Encounter (HOSPITAL_COMMUNITY)
Admission: RE | Disposition: A | Discharge status: Home / Self Care | Payer: Self-pay | Source: Home / Self Care | Attending: Nephrology

## 2023-11-17 ENCOUNTER — Encounter (HOSPITAL_COMMUNITY): Payer: Self-pay | Admitting: Nephrology

## 2023-11-17 ENCOUNTER — Other Ambulatory Visit: Payer: Self-pay

## 2023-11-17 DIAGNOSIS — I440 Atrioventricular block, first degree: Secondary | ICD-10-CM | POA: Insufficient documentation

## 2023-11-17 DIAGNOSIS — N186 End stage renal disease: Secondary | ICD-10-CM | POA: Insufficient documentation

## 2023-11-17 DIAGNOSIS — I120 Hypertensive chronic kidney disease with stage 5 chronic kidney disease or end stage renal disease: Secondary | ICD-10-CM | POA: Diagnosis not present

## 2023-11-17 DIAGNOSIS — E039 Hypothyroidism, unspecified: Secondary | ICD-10-CM | POA: Diagnosis not present

## 2023-11-17 DIAGNOSIS — T82858A Stenosis of vascular prosthetic devices, implants and grafts, initial encounter: Secondary | ICD-10-CM | POA: Diagnosis not present

## 2023-11-17 DIAGNOSIS — Z992 Dependence on renal dialysis: Secondary | ICD-10-CM | POA: Insufficient documentation

## 2023-11-17 DIAGNOSIS — Z79899 Other long term (current) drug therapy: Secondary | ICD-10-CM | POA: Diagnosis not present

## 2023-11-17 DIAGNOSIS — G4733 Obstructive sleep apnea (adult) (pediatric): Secondary | ICD-10-CM | POA: Insufficient documentation

## 2023-11-17 DIAGNOSIS — N250 Renal osteodystrophy: Secondary | ICD-10-CM | POA: Diagnosis not present

## 2023-11-17 DIAGNOSIS — I871 Compression of vein: Secondary | ICD-10-CM | POA: Diagnosis not present

## 2023-11-17 DIAGNOSIS — D631 Anemia in chronic kidney disease: Secondary | ICD-10-CM | POA: Diagnosis not present

## 2023-11-17 DIAGNOSIS — Y832 Surgical operation with anastomosis, bypass or graft as the cause of abnormal reaction of the patient, or of later complication, without mention of misadventure at the time of the procedure: Secondary | ICD-10-CM | POA: Insufficient documentation

## 2023-11-17 DIAGNOSIS — Z8673 Personal history of transient ischemic attack (TIA), and cerebral infarction without residual deficits: Secondary | ICD-10-CM | POA: Diagnosis not present

## 2023-11-17 HISTORY — PX: PERIPHERAL VASCULAR BALLOON ANGIOPLASTY: CATH118281

## 2023-11-17 HISTORY — PX: A/V FISTULAGRAM: CATH118298

## 2023-11-17 SURGERY — A/V FISTULAGRAM
Anesthesia: LOCAL

## 2023-11-17 MED ORDER — FENTANYL CITRATE (PF) 100 MCG/2ML IJ SOLN
INTRAMUSCULAR | Status: AC
  Filled 2023-11-17: qty 2

## 2023-11-17 MED ORDER — MIDAZOLAM HCL 2 MG/2ML IJ SOLN
INTRAMUSCULAR | Status: DC | PRN
  Administered 2023-11-17: .5 mg via INTRAVENOUS

## 2023-11-17 MED ORDER — LIDOCAINE HCL (PF) 1 % IJ SOLN
INTRAMUSCULAR | Status: AC
  Filled 2023-11-17: qty 30

## 2023-11-17 MED ORDER — MIDAZOLAM HCL 2 MG/2ML IJ SOLN
INTRAMUSCULAR | Status: AC
  Filled 2023-11-17: qty 2

## 2023-11-17 MED ORDER — HEPARIN (PORCINE) IN NACL 1000-0.9 UT/500ML-% IV SOLN
INTRAVENOUS | Status: DC | PRN
  Administered 2023-11-17: 500 mL

## 2023-11-17 MED ORDER — IODIXANOL 320 MG/ML IV SOLN
INTRAVENOUS | Status: DC | PRN
  Administered 2023-11-17: 6 mL via INTRAVENOUS

## 2023-11-17 MED ORDER — FENTANYL CITRATE (PF) 100 MCG/2ML IJ SOLN
INTRAMUSCULAR | Status: DC | PRN
  Administered 2023-11-17: 25 ug via INTRAVENOUS

## 2023-11-17 MED ORDER — LIDOCAINE HCL (PF) 1 % IJ SOLN
INTRAMUSCULAR | Status: DC | PRN
  Administered 2023-11-17: 2 mL

## 2023-11-17 SURGICAL SUPPLY — 12 items
BAG SNAP BAND KOVER 36X36 (MISCELLANEOUS) ×2 IMPLANT
BALLN ATHLETIS 8X40X75 (BALLOONS) ×2
BALLOON ATHLETIS 8X40X75 (BALLOONS) IMPLANT
COVER DOME SNAP 22 D (MISCELLANEOUS) ×2 IMPLANT
PROTECTION STATION PRESSURIZED (MISCELLANEOUS) ×2
SHEATH PINNACLE R/O II 6F 4CM (SHEATH) IMPLANT
SHEATH PROBE COVER 6X72 (BAG) ×2 IMPLANT
STATION PROTECTION PRESSURIZED (MISCELLANEOUS) ×2 IMPLANT
STOPCOCK MORSE 400PSI 3WAY (MISCELLANEOUS) ×2 IMPLANT
TRAY PV CATH (CUSTOM PROCEDURE TRAY) ×3 IMPLANT
TUBING CIL FLEX 10 FLL-RA (TUBING) ×2 IMPLANT
WIRE BENTSON .035X145CM (WIRE) IMPLANT

## 2023-11-17 NOTE — H&P (Addendum)
 Chief Complaint: Decreased flows  Interval H&P  The patient has presented today for an angiogram/ angioplasty.  Various methods of treatment have been discussed with the patient.  After consideration of risk, benefits and other options for treatment, the patient has consented to a angiogram/ angioplasty with  possible stent placement.   Risks of angiogram with potential angioplasty and stenting if needed.contrast reaction, extravasation/ bleeding, dissection, hypotension and death were explained to the patient.  The patient's history has been reviewed and the patient has been examined, no changes in status.  Stable for angiogram/angioplasty  I have reviewed the patient's chart and labs.  Questions were answered to the patient's satisfaction.  Assessment/Plan: ESRD dialyzing TTS regimen. Decreased access flows a right upper arm AV graft placed October 26, 2021 by Dr. Randie Heinz- planning on angiogram with possibly angioplasty.  She had history of a removal of an infected left upper arm AV graft on March 28, 2023 by Dr. Randie Heinz with excision of the graft, brachial vein patch of the left brachial artery at the anastomotic site. Renal osteodystrophy - continue binders per home regimen. Anemia - managed with ESA's and IV iron at dialysis center. HTN - resume home regimen.   HPI: Kimberly [name] is an 68 y.o. female here first-degree AV block, hypertension, hypothyroidism, IgA nephropathy, transplant rejection, OSA, CVA, cerebral aneurysm, ESRD dialyzing Tuesday Thursdays and Saturdays.  Patient being referred for decreasing flows in her right upper arm AV graft.  ROS Per HPI.  Chemistry and CBC: Creat  Date/Time Value Ref Range Status  08/29/2014 10:23 AM 8.21 (H) 0.50 - 1.10 mg/dL Final    Comment:    Result repeated and verified.   Creatinine, Ser  Date/Time Value Ref Range Status  03/28/2023 08:48 AM 5.40 (H) 0.44 - 1.00 mg/dL Final  45/40/9811 91:47 AM 6.20 (H) 0.44 - 1.00 mg/dL Final   82/95/6213 08:65 AM 6.77 (H) 0.44 - 1.00 mg/dL Final  78/46/9629 52:84 PM 11.00 (H) 0.44 - 1.00 mg/dL Final  13/24/4010 27:25 AM 5.64 (H) 0.44 - 1.00 mg/dL Final  36/64/4034 74:25 AM 8.24 (H) 0.44 - 1.00 mg/dL Final  95/63/8756 43:32 AM 7.81 (H) 0.44 - 1.00 mg/dL Final  95/18/8416 60:63 AM 5.39 (H) 0.44 - 1.00 mg/dL Final    Comment:    DELTA CHECK NOTED  02/07/2019 03:40 AM 3.24 (H) 0.44 - 1.00 mg/dL Final    Comment:    DELTA CHECK NOTED  02/06/2019 02:18 AM 7.11 (H) 0.44 - 1.00 mg/dL Final  01/60/1093 23:55 PM 6.72 (H) 0.44 - 1.00 mg/dL Final  73/22/0254 27:06 AM 9.73 (H) 0.44 - 1.00 mg/dL Final  23/76/2831 51:76 PM 8.70 (H) 0.44 - 1.00 mg/dL Final  16/03/3709 62:69 PM 9.13 (H) 0.44 - 1.00 mg/dL Final  48/54/6270 35:00 PM 10.10 (H) 0.44 - 1.00 mg/dL Final  93/81/8299 37:16 PM 9.37 (H) 0.44 - 1.00 mg/dL Final  96/78/9381 01:75 AM 11.46 (H) 0.44 - 1.00 mg/dL Final  07/17/8526 78:24 PM 12.46 (H) 0.44 - 1.00 mg/dL Final  23/53/6144 31:54 AM 7.75 (H) 0.50 - 1.10 mg/dL Final  00/86/7619 50:93 AM 8.72 (H) 0.50 - 1.10 mg/dL Final  26/71/2458 09:98 AM 9.60 (H) 0.50 - 1.10 mg/dL Final  33/82/5053 97:67 AM 8.77 (H) 0.50 - 1.10 mg/dL Final  34/19/3790 24:09 PM 9.63 (H) 0.50 - 1.10 mg/dL Final  73/53/2992 42:68 AM 7.37 (H) 0.50 - 1.10 mg/dL Final  34/19/6222 97:98 PM 7.1 (HH) 0.4 - 1.2 mg/dL Final  92/07/9416 40:81 AM 4.5 (HH) 0.4 -  1.2 mg/dL Final  82/95/6213 08:65 PM 3.12 (H) 0.50 - 1.10 mg/dL Final  78/46/9629 52:84 AM 3.10 (H) 0.4 - 1.2 mg/dL Final  13/24/4010 27:25 PM 1.18 0.4 - 1.2 mg/dL Final  36/64/4034 74:25 PM 12.82 (H) 0.4 - 1.2 mg/dL Final  95/63/8756 43:32 AM 12.14 (H) 0.4 - 1.2 mg/dL Final  95/18/8416 60:63 PM 11.8 (H) 0.4 - 1.2 mg/dL Final  01/60/1093 23:55 PM 12.20 (H) 0.4 - 1.2 mg/dL Final  73/22/0254 27:06 AM 18.48 (H) 0.4 - 1.2 mg/dL Final  23/76/2831 51:76 PM 18.32 (H) 0.4 - 1.2 mg/dL Final  16/03/3709 62:69 PM 19.30 (H) 0.4 - 1.2 mg/dL Final  48/54/6270 35:00 AM  4.16 (H)  Final  11/25/2007 07:48 AM 3.98 (H)  Final  10/21/2007 07:48 AM 3.72 (H)  Final  09/16/2007 08:00 AM 3.52 (H)  Final  08/12/2007 07:51 AM 3.51 (H)  Final  07/08/2007 07:57 AM 3.61 (H)  Final  06/03/2007 07:46 AM 3.50 (H)  Final  04/27/2007 07:41 AM 3.59 (H)  Final   No results for input(s): "NA", "K", "CL", "CO2", "GLUCOSE", "BUN", "CREATININE", "CALCIUM", "PHOS" in the last 168 hours.  Invalid input(s): "ALB" No results for input(s): "WBC", "NEUTROABS", "HGB", "HCT", "MCV", "PLT" in the last 168 hours. Liver Function Tests: No results for input(s): "AST", "ALT", "ALKPHOS", "BILITOT", "PROT", "ALBUMIN" in the last 168 hours. No results for input(s): "LIPASE", "AMYLASE" in the last 168 hours. No results for input(s): "AMMONIA" in the last 168 hours. Cardiac Enzymes: No results for input(s): "CKTOTAL", "CKMB", "CKMBINDEX", "TROPONINI" in the last 168 hours. Iron Studies: No results for input(s): "IRON", "TIBC", "TRANSFERRIN", "FERRITIN" in the last 72 hours. PT/INR: @LABRCNTIP (inr:5)  Xrays/Other Studies: )No results found for this or any previous visit (from the past 48 hours). No results found.  PMH:   Past Medical History:  Diagnosis Date   Anemia    Arthritis    Asthma    Complication of anesthesia    Slow to awaken and AMS- took 1 month to memory and recall to return   COVID    x2   Diverticulosis    First degree AV block    GERD (gastroesophageal reflux disease)    History of blood transfusion    History of chronic cough    History of hiatal hernia    History of hoarseness    Hypertension    history; resolved on dialysis   Hypothyroidism    IgA nephropathy    ESRD - Hemo TTH Sat   Premature atrial beat    Premature ventricular beat    Renal transplant failure and rejection 2012   Shingles 03/2011   Sleep apnea    uses a mouth guard   Stroke [facility]) October 2010, March 2013   cerebral aneurysm, 07/2017    PSH:   Past Surgical History:  Procedure  Laterality Date   AV FISTULA PLACEMENT Left 05/20/2018   Procedure: INSERTION OF ARTERIOVENOUS (AV) GORE-TEX GRAFT LEFT  ARM;  Surgeon: Maeola Harman, MD;  Location: [facility] OR;  Service: Vascular;  Laterality: Left;   AV FISTULA PLACEMENT Right 10/26/2021   Procedure: INSERTION OF RIGHT ARM ARTERIOVENOUS (AV) GORE-TEX GRAFT;  Surgeon: Maeola Harman, MD;  Location: [facility] OR;  Service: Vascular;  Laterality: Right;   AVGG REMOVAL Left 03/28/2023   Procedure: LEFT ARM ARTERIOVENOUS GORETEX GRAFT EXCISION;  Surgeon: Maeola Harman, MD;  Location[facility] OR;  Service: Vascular;  Laterality: Left;   CAPD INSERTION  08/25/2008   open,  Dr Zachery Dakins   CAPD INSERTION N/A 12/14/2013   Procedure: LAPAROSCOPIC INSERTION CONTINUOUS AMBULATORY PERITONEAL DIALYSIS  (CAPD) CATHETER;  Surgeon: Ardeth Sportsman, MD;  Location: [facility] OR;  Service: General;  Laterality: N/A;   CAPD REMOVAL  03/22/2010   Dr Zachery Dakins   COLONOSCOPY     ESOPHAGOGASTRODUODENOSCOPY     EYE SURGERY Bilateral    radial keratotomy   Hemodialysis catheter Right 02/13/2018   INSERTION OF MESH N/A 12/14/2013   Procedure: INSERTION OF MESH;  Surgeon: Ardeth Sportsman, MD;  Location: [facility] OR;  Service: General;  Laterality: N/A;   KIDNEY TRANSPLANT Right 01/2010   [facility]   LAPAROSCOPIC LYSIS OF ADHESIONS N/A 12/14/2013   Procedure: LAPAROSCOPIC LYSIS OF ADHESIONS;  Surgeon: Ardeth Sportsman, MD;  Location: [facility] OR;  Service: General;  Laterality: N/A;   PATCH ANGIOPLASTY  06/29/2021   Procedure: PATCH ANGIOPLASTY;  Surgeon: Leonie Douglas, MD;  Location: [facility] OR;  Service: Vascular;;   PATCH ANGIOPLASTY  03/28/2023   Procedure: VEIN PATCH ANGIOPLASTY;  Surgeon: Maeola Harman, MD;  Location[facility] OR;  Service: Vascular;;   THROMBECTOMY AND REVISION OF ARTERIOVENTOUS (AV) GORETEX  GRAFT Left 06/29/2021   Procedure: REVISION OF LEFT UPPER EXTREMITY ARTERIOVENOUS GORETEX GRAFT WITH THROMBECTOMY;  Surgeon: Leonie Douglas, MD;   Location: [facility] OR;  Service: Vascular;  Laterality: Left;  PERIPHERAL NERVE BLOCK   TOTAL HIP ARTHROPLASTY Left 02/06/2019   Procedure: TOTAL HIP ARTHROPLASTY ANTERIOR APPROACH;  Surgeon: Samson Frederic, MD;  Location: [facility] OR;  Service: Orthopedics;  Laterality: Left;   UMBILICAL HERNIA REPAIR  10/25/2009   open with mesh,  Dr Zachery Dakins   URETER REVISION  04/2011   [facility]   VENTRAL HERNIA REPAIR N/A 12/14/2013   Procedure: LAPAROSCOPIC VENTRAL HERNIA;  Surgeon: Ardeth Sportsman, MD;  Location: [facility] OR;  Service: General;  Laterality: N/A;   VIDEO ASSISTED THORACOSCOPY (VATS)/WEDGE RESECTION Left 09/01/2014   Procedure: VIDEO ASSISTED THORACOSCOPY (VATS)/WEDGE RESECTION;  Surgeon: Loreli Slot, MD;  Location: [facility] OR;  Service: Thoracic;  Laterality: Left;   VIDEO BRONCHOSCOPY Bilateral 07/05/2014   Procedure: VIDEO BRONCHOSCOPY WITH FLUORO;  Surgeon: Nyoka Cowden, MD;  Location: [facility]COPY;  Service: Cardiopulmonary;  Laterality: Bilateral;    Allergies:  Allergies  Allergen Reactions   Mircera [Methoxy Polyethylene Glycol-Epoetin Beta] Other (See Comments)    Severe joint and muscle pain   Mycophenolate Mofetil Diarrhea   Dapsone Rash and Other (See Comments)    Pain in feet   Pregabalin Rash and Other (See Comments)    Fever - reaction to Lyrica   Sulfonamide Derivatives Rash   Tramadol Rash and Other (See Comments)    Rash on cheek    Medications:   Prior to Admission medications   Medication Sig Start Date End Date Taking? Authorizing Provider  albuterol (VENTOLIN HFA) 108 (90 Base) MCG/ACT inhaler Inhale 2 puffs into the lungs every 6 (six) hours as needed for shortness of breath or wheezing (Asthma). 10/25/22   Mannam, Colbert Coyer, MD  amLODipine (NORVASC) 10 MG tablet Take 10 mg by mouth every evening. 09/21/21   [provider]  budesonide-formoterol (SYMBICORT) 160-4.5 MCG/ACT inhaler INHALE 2 PUFFS INTO THE LUNGS TWICE DAILY 09/15/23   Nyoka Cowden, MD  cinacalcet  (SENSIPAR) 60 MG tablet Take 60 mg by mouth daily after supper. Taking 60mg  daily    [provider]  EPOETIN ALFA IJ Inject into the skin every Tuesday, Thursday, and Saturday at 6 PM. Inject  subcutaneously at dialysis as needed for low hemoglobin    [provider]  hydroxychloroquine (PLAQUENIL) 200 MG tablet Take 200 mg by mouth in the morning.    [provider]  levothyroxine (SYNTHROID, LEVOTHROID) 50 MCG tablet Take 50-75 mcg by mouth See admin instructions. Take  1 1/2 tablets (75 mcg) by mouth daily on Tuesday and Thursday nights, take 1 tablet (50 mcg) by mouth daily on all other nights of the week    [provider]  losartan (COZAAR) 100 MG tablet Take 100 mg by mouth in the morning.    [provider]  OVER THE COUNTER MEDICATION Take 2 capsules by mouth in the morning, at noon, in the evening, and at bedtime. Juice plus - 2 fruit, 2 vegetable, 2 berries, 2 omega    [provider]  oxyCODONE-acetaminophen (PERCOCET) 5-325 MG tablet Take 1 tablet by mouth every 6 (six) hours as needed for severe pain. 03/28/23 03/27/24  Baglia, Corrina, PA-C  predniSONE (DELTASONE) 5 MG tablet Take 5 mg by mouth daily with breakfast.    [provider]  Probiotic Product (PROBIOTIC DAILY PO) Take 1 tablet by mouth every evening.    [provider]  Propylene Glycol (SYSTANE BALANCE OP) Place 1-2 drops into both eyes 3 (three) times daily as needed (dry/irritated eyes.).    [provider]  QUERCETIN PO Take 1 capsule by mouth in the morning.    [provider]  sevelamer carbonate (RENVELA) 800 MG tablet Take 800-1,600 mg by mouth See admin instructions. TAKE 2 TABLETS (1600 MG) BY MOUTH THREE TIMES DAILY WITH MEALS AND TAKE 1 TABLET (800 MG) BY MOUTH WITH SNACKS    [provider]  tiotropium (SPIRIVA) 18 MCG inhalation capsule PLACE 1 CAPSULE INTO INHALER AND INHALE DAILY 10/20/23   Mannam, Praveen, MD  Zinc  Chelated 22.5 MG TABS Take 22.5 mg by mouth in the morning.    [provider]    Discontinued Meds:  There are no discontinued medications.  Social History:  reports that she has never smoked. She has never been exposed to tobacco smoke. She has never used smokeless tobacco. She reports current alcohol use. She reports that she does not use drugs.  Family History:   Family History  Problem Relation Age of Onset   Coronary artery disease Mother    Emphysema Mother        smoked   Ovarian cancer Mother    Liver disease Father    Stroke Maternal Uncle    Diabetes Maternal Uncle    Kidney disease Maternal Uncle     There were no vitals taken for this visit. General: Well developed, alert female in NAD Heart: RRR, no murmurs, rubs or gallops Lungs: CTA b/l without wheezing, rhonchi or rales Abdomen: Soft nontender nondistended +BS Extremities: No peripheral edema Dialysis Access: LUE AVG + bruit pulsatile towards the outflow       Zacharias Ridling, Len Blalock, MD 11/17/2023, 10:50 AM

## 2023-11-17 NOTE — Discharge Instructions (Signed)

## 2023-11-17 NOTE — Op Note (Signed)
 Patient presents with decreased access flows from her right upper arm straight graft placed  on March 28, 2023.   On exam the RUA straight AVG is hyperpulsatile towards the outflow.   Summary:  1) Successful angiogram of a right upper arm straight AVG   with evidence of a 50% venous anastomosis to axillary vein stenosis + 50% venous limb intragraft treated to 10% with a 8 mm Mustang FE ~16 atm.   2) The body of the graft, central veins, and inflow were widely patent. 3) This right upper arm straight graft remains amenable to future percutaneous intervention. If lesion recurs in < 3months, would favor stent graft placement.   Description of procedure: The right upper arm was prepped and draped in the usual fashion. The right upper arm straight AVG was cannulated (16109) in the arterial limb of the graft in an antegrade direction with an 18G needle and then a 6 Fr sheath was inserted by guidewire exchange technique. The angiogram revealed a 50% venous limb intragraft stenosis, 50% venous anastomosis stenosis. The body of the graft, central veins and inflow were patent.  A guidewire was easily advanced past the outflow stenosis and parked in the central veins. I then advanced an 8 x 4 Mustang angioplasty balloon through the antegrade sheath over the guidewire to the level of the venous anastomosis to axillary vein and then venous limb intragraft stenosis. Venous angioplasty was performed to 100% balloon effacement with approximately 16ATM of pressure via a hand syringe assembly.   Final arteriogram and completion venogram revealed no evidence of extravasation or dissection, more rapid access flows through the graft and 10% residual stenosis in the venous anastomosis.  Hemostasis: A 3-0 ethilon purse string suture was placed at the cannulation site on removal of the sheath.  Sedation: 0.5 mg Versed, 25 mcg Fentanyl.  Sedation time: 11 minutes  Contrast. 6  mL  Monitoring: Because of the patient's  comorbid conditions and sedation during the procedure, continuous EKG monitoring and O2 saturation monitoring was performed throughout the procedure by the RN. There were no abnormal arrhythmias encountered.  Complications: None.   Diagnoses: I87.1 Stricture of vein  N18.6 ESRD T82.858A Stricture of access  Procedure Coding:  [phone] Cannulation and angiogram of fistula, venous angioplasty (AVG VA + VL IG) U9811 Contrast  Recommendations:  1. Continue to cannulate the fistula with 15G needles.  2. Refer back for problems with flows. 3. Remove the suture next treatment.   Discharge: The patient was discharged home in stable condition. The patient was given education regarding the care of the dialysis access AVF and specific instructions in case of any problems.

## 2023-11-18 DIAGNOSIS — D689 Coagulation defect, unspecified: Secondary | ICD-10-CM | POA: Diagnosis not present

## 2023-11-18 DIAGNOSIS — Z992 Dependence on renal dialysis: Secondary | ICD-10-CM | POA: Diagnosis not present

## 2023-11-18 DIAGNOSIS — N186 End stage renal disease: Secondary | ICD-10-CM | POA: Diagnosis not present

## 2023-11-18 DIAGNOSIS — N2581 Secondary hyperparathyroidism of renal origin: Secondary | ICD-10-CM | POA: Diagnosis not present

## 2023-11-18 DIAGNOSIS — D631 Anemia in chronic kidney disease: Secondary | ICD-10-CM | POA: Diagnosis not present

## 2023-11-18 DIAGNOSIS — E039 Hypothyroidism, unspecified: Secondary | ICD-10-CM | POA: Diagnosis not present

## 2023-11-21 DIAGNOSIS — T8612 Kidney transplant failure: Secondary | ICD-10-CM | POA: Diagnosis not present

## 2023-11-21 DIAGNOSIS — Z992 Dependence on renal dialysis: Secondary | ICD-10-CM | POA: Diagnosis not present

## 2023-11-21 DIAGNOSIS — N186 End stage renal disease: Secondary | ICD-10-CM | POA: Diagnosis not present

## 2023-11-22 DIAGNOSIS — N186 End stage renal disease: Secondary | ICD-10-CM | POA: Diagnosis not present

## 2023-11-22 DIAGNOSIS — D631 Anemia in chronic kidney disease: Secondary | ICD-10-CM | POA: Diagnosis not present

## 2023-11-22 DIAGNOSIS — E039 Hypothyroidism, unspecified: Secondary | ICD-10-CM | POA: Diagnosis not present

## 2023-11-22 DIAGNOSIS — N2581 Secondary hyperparathyroidism of renal origin: Secondary | ICD-10-CM | POA: Diagnosis not present

## 2023-11-22 DIAGNOSIS — D689 Coagulation defect, unspecified: Secondary | ICD-10-CM | POA: Diagnosis not present

## 2023-11-22 DIAGNOSIS — Z992 Dependence on renal dialysis: Secondary | ICD-10-CM | POA: Diagnosis not present

## 2023-11-25 DIAGNOSIS — Z992 Dependence on renal dialysis: Secondary | ICD-10-CM | POA: Diagnosis not present

## 2023-11-25 DIAGNOSIS — E039 Hypothyroidism, unspecified: Secondary | ICD-10-CM | POA: Diagnosis not present

## 2023-11-25 DIAGNOSIS — D631 Anemia in chronic kidney disease: Secondary | ICD-10-CM | POA: Diagnosis not present

## 2023-11-25 DIAGNOSIS — N186 End stage renal disease: Secondary | ICD-10-CM | POA: Diagnosis not present

## 2023-11-25 DIAGNOSIS — D689 Coagulation defect, unspecified: Secondary | ICD-10-CM | POA: Diagnosis not present

## 2023-11-25 DIAGNOSIS — N2581 Secondary hyperparathyroidism of renal origin: Secondary | ICD-10-CM | POA: Diagnosis not present

## 2023-11-27 DIAGNOSIS — Z992 Dependence on renal dialysis: Secondary | ICD-10-CM | POA: Diagnosis not present

## 2023-11-27 DIAGNOSIS — D689 Coagulation defect, unspecified: Secondary | ICD-10-CM | POA: Diagnosis not present

## 2023-11-27 DIAGNOSIS — N2581 Secondary hyperparathyroidism of renal origin: Secondary | ICD-10-CM | POA: Diagnosis not present

## 2023-11-27 DIAGNOSIS — D631 Anemia in chronic kidney disease: Secondary | ICD-10-CM | POA: Diagnosis not present

## 2023-11-27 DIAGNOSIS — E039 Hypothyroidism, unspecified: Secondary | ICD-10-CM | POA: Diagnosis not present

## 2023-11-27 DIAGNOSIS — N186 End stage renal disease: Secondary | ICD-10-CM | POA: Diagnosis not present

## 2023-11-29 DIAGNOSIS — N186 End stage renal disease: Secondary | ICD-10-CM | POA: Diagnosis not present

## 2023-11-29 DIAGNOSIS — D631 Anemia in chronic kidney disease: Secondary | ICD-10-CM | POA: Diagnosis not present

## 2023-11-29 DIAGNOSIS — D689 Coagulation defect, unspecified: Secondary | ICD-10-CM | POA: Diagnosis not present

## 2023-11-29 DIAGNOSIS — N2581 Secondary hyperparathyroidism of renal origin: Secondary | ICD-10-CM | POA: Diagnosis not present

## 2023-11-29 DIAGNOSIS — Z992 Dependence on renal dialysis: Secondary | ICD-10-CM | POA: Diagnosis not present

## 2023-11-29 DIAGNOSIS — E039 Hypothyroidism, unspecified: Secondary | ICD-10-CM | POA: Diagnosis not present

## 2023-12-01 DIAGNOSIS — Z1331 Encounter for screening for depression: Secondary | ICD-10-CM | POA: Diagnosis not present

## 2023-12-01 DIAGNOSIS — M810 Age-related osteoporosis without current pathological fracture: Secondary | ICD-10-CM | POA: Diagnosis not present

## 2023-12-01 DIAGNOSIS — N186 End stage renal disease: Secondary | ICD-10-CM | POA: Diagnosis not present

## 2023-12-01 DIAGNOSIS — Z0000 Encounter for general adult medical examination without abnormal findings: Secondary | ICD-10-CM | POA: Diagnosis not present

## 2023-12-01 DIAGNOSIS — J449 Chronic obstructive pulmonary disease, unspecified: Secondary | ICD-10-CM | POA: Diagnosis not present

## 2023-12-01 DIAGNOSIS — J45909 Unspecified asthma, uncomplicated: Secondary | ICD-10-CM | POA: Diagnosis not present

## 2023-12-02 DIAGNOSIS — N186 End stage renal disease: Secondary | ICD-10-CM | POA: Diagnosis not present

## 2023-12-02 DIAGNOSIS — Z992 Dependence on renal dialysis: Secondary | ICD-10-CM | POA: Diagnosis not present

## 2023-12-02 DIAGNOSIS — E039 Hypothyroidism, unspecified: Secondary | ICD-10-CM | POA: Diagnosis not present

## 2023-12-02 DIAGNOSIS — D631 Anemia in chronic kidney disease: Secondary | ICD-10-CM | POA: Diagnosis not present

## 2023-12-02 DIAGNOSIS — N2581 Secondary hyperparathyroidism of renal origin: Secondary | ICD-10-CM | POA: Diagnosis not present

## 2023-12-02 DIAGNOSIS — D689 Coagulation defect, unspecified: Secondary | ICD-10-CM | POA: Diagnosis not present

## 2023-12-03 DIAGNOSIS — M2012 Hallux valgus (acquired), left foot: Secondary | ICD-10-CM | POA: Diagnosis not present

## 2023-12-06 DIAGNOSIS — D689 Coagulation defect, unspecified: Secondary | ICD-10-CM | POA: Diagnosis not present

## 2023-12-06 DIAGNOSIS — N2581 Secondary hyperparathyroidism of renal origin: Secondary | ICD-10-CM | POA: Diagnosis not present

## 2023-12-06 DIAGNOSIS — N186 End stage renal disease: Secondary | ICD-10-CM | POA: Diagnosis not present

## 2023-12-06 DIAGNOSIS — E039 Hypothyroidism, unspecified: Secondary | ICD-10-CM | POA: Diagnosis not present

## 2023-12-06 DIAGNOSIS — D631 Anemia in chronic kidney disease: Secondary | ICD-10-CM | POA: Diagnosis not present

## 2023-12-06 DIAGNOSIS — Z992 Dependence on renal dialysis: Secondary | ICD-10-CM | POA: Diagnosis not present

## 2023-12-09 DIAGNOSIS — D689 Coagulation defect, unspecified: Secondary | ICD-10-CM | POA: Diagnosis not present

## 2023-12-09 DIAGNOSIS — D631 Anemia in chronic kidney disease: Secondary | ICD-10-CM | POA: Diagnosis not present

## 2023-12-09 DIAGNOSIS — N2581 Secondary hyperparathyroidism of renal origin: Secondary | ICD-10-CM | POA: Diagnosis not present

## 2023-12-09 DIAGNOSIS — Z992 Dependence on renal dialysis: Secondary | ICD-10-CM | POA: Diagnosis not present

## 2023-12-09 DIAGNOSIS — N186 End stage renal disease: Secondary | ICD-10-CM | POA: Diagnosis not present

## 2023-12-09 DIAGNOSIS — E039 Hypothyroidism, unspecified: Secondary | ICD-10-CM | POA: Diagnosis not present

## 2023-12-11 DIAGNOSIS — N186 End stage renal disease: Secondary | ICD-10-CM | POA: Diagnosis not present

## 2023-12-11 DIAGNOSIS — D689 Coagulation defect, unspecified: Secondary | ICD-10-CM | POA: Diagnosis not present

## 2023-12-11 DIAGNOSIS — Z992 Dependence on renal dialysis: Secondary | ICD-10-CM | POA: Diagnosis not present

## 2023-12-11 DIAGNOSIS — D631 Anemia in chronic kidney disease: Secondary | ICD-10-CM | POA: Diagnosis not present

## 2023-12-11 DIAGNOSIS — N2581 Secondary hyperparathyroidism of renal origin: Secondary | ICD-10-CM | POA: Diagnosis not present

## 2023-12-11 DIAGNOSIS — E039 Hypothyroidism, unspecified: Secondary | ICD-10-CM | POA: Diagnosis not present

## 2023-12-12 DIAGNOSIS — D631 Anemia in chronic kidney disease: Secondary | ICD-10-CM | POA: Diagnosis not present

## 2023-12-12 DIAGNOSIS — N186 End stage renal disease: Secondary | ICD-10-CM | POA: Diagnosis not present

## 2023-12-12 DIAGNOSIS — D689 Coagulation defect, unspecified: Secondary | ICD-10-CM | POA: Diagnosis not present

## 2023-12-12 DIAGNOSIS — E039 Hypothyroidism, unspecified: Secondary | ICD-10-CM | POA: Diagnosis not present

## 2023-12-12 DIAGNOSIS — Z992 Dependence on renal dialysis: Secondary | ICD-10-CM | POA: Diagnosis not present

## 2023-12-12 DIAGNOSIS — N2581 Secondary hyperparathyroidism of renal origin: Secondary | ICD-10-CM | POA: Diagnosis not present

## 2023-12-13 DIAGNOSIS — E039 Hypothyroidism, unspecified: Secondary | ICD-10-CM | POA: Diagnosis not present

## 2023-12-13 DIAGNOSIS — D689 Coagulation defect, unspecified: Secondary | ICD-10-CM | POA: Diagnosis not present

## 2023-12-13 DIAGNOSIS — N186 End stage renal disease: Secondary | ICD-10-CM | POA: Diagnosis not present

## 2023-12-13 DIAGNOSIS — N2581 Secondary hyperparathyroidism of renal origin: Secondary | ICD-10-CM | POA: Diagnosis not present

## 2023-12-13 DIAGNOSIS — Z992 Dependence on renal dialysis: Secondary | ICD-10-CM | POA: Diagnosis not present

## 2023-12-13 DIAGNOSIS — D631 Anemia in chronic kidney disease: Secondary | ICD-10-CM | POA: Diagnosis not present

## 2023-12-16 DIAGNOSIS — E039 Hypothyroidism, unspecified: Secondary | ICD-10-CM | POA: Diagnosis not present

## 2023-12-16 DIAGNOSIS — D689 Coagulation defect, unspecified: Secondary | ICD-10-CM | POA: Diagnosis not present

## 2023-12-16 DIAGNOSIS — D631 Anemia in chronic kidney disease: Secondary | ICD-10-CM | POA: Diagnosis not present

## 2023-12-16 DIAGNOSIS — Z992 Dependence on renal dialysis: Secondary | ICD-10-CM | POA: Diagnosis not present

## 2023-12-16 DIAGNOSIS — N2581 Secondary hyperparathyroidism of renal origin: Secondary | ICD-10-CM | POA: Diagnosis not present

## 2023-12-16 DIAGNOSIS — N186 End stage renal disease: Secondary | ICD-10-CM | POA: Diagnosis not present

## 2023-12-17 DIAGNOSIS — K080 Exfoliation of teeth due to systemic causes: Secondary | ICD-10-CM | POA: Diagnosis not present

## 2023-12-18 DIAGNOSIS — N2581 Secondary hyperparathyroidism of renal origin: Secondary | ICD-10-CM | POA: Diagnosis not present

## 2023-12-18 DIAGNOSIS — N186 End stage renal disease: Secondary | ICD-10-CM | POA: Diagnosis not present

## 2023-12-18 DIAGNOSIS — D689 Coagulation defect, unspecified: Secondary | ICD-10-CM | POA: Diagnosis not present

## 2023-12-18 DIAGNOSIS — Z992 Dependence on renal dialysis: Secondary | ICD-10-CM | POA: Diagnosis not present

## 2023-12-18 DIAGNOSIS — D631 Anemia in chronic kidney disease: Secondary | ICD-10-CM | POA: Diagnosis not present

## 2023-12-18 DIAGNOSIS — E039 Hypothyroidism, unspecified: Secondary | ICD-10-CM | POA: Diagnosis not present

## 2023-12-19 DIAGNOSIS — Z992 Dependence on renal dialysis: Secondary | ICD-10-CM | POA: Diagnosis not present

## 2023-12-19 DIAGNOSIS — N186 End stage renal disease: Secondary | ICD-10-CM | POA: Diagnosis not present

## 2023-12-19 DIAGNOSIS — D689 Coagulation defect, unspecified: Secondary | ICD-10-CM | POA: Diagnosis not present

## 2023-12-19 DIAGNOSIS — N2581 Secondary hyperparathyroidism of renal origin: Secondary | ICD-10-CM | POA: Diagnosis not present

## 2023-12-19 DIAGNOSIS — E039 Hypothyroidism, unspecified: Secondary | ICD-10-CM | POA: Diagnosis not present

## 2023-12-19 DIAGNOSIS — D631 Anemia in chronic kidney disease: Secondary | ICD-10-CM | POA: Diagnosis not present

## 2023-12-20 DIAGNOSIS — N2581 Secondary hyperparathyroidism of renal origin: Secondary | ICD-10-CM | POA: Diagnosis not present

## 2023-12-20 DIAGNOSIS — D689 Coagulation defect, unspecified: Secondary | ICD-10-CM | POA: Diagnosis not present

## 2023-12-20 DIAGNOSIS — N186 End stage renal disease: Secondary | ICD-10-CM | POA: Diagnosis not present

## 2023-12-20 DIAGNOSIS — D631 Anemia in chronic kidney disease: Secondary | ICD-10-CM | POA: Diagnosis not present

## 2023-12-20 DIAGNOSIS — Z992 Dependence on renal dialysis: Secondary | ICD-10-CM | POA: Diagnosis not present

## 2023-12-20 DIAGNOSIS — E039 Hypothyroidism, unspecified: Secondary | ICD-10-CM | POA: Diagnosis not present

## 2023-12-22 DIAGNOSIS — Z992 Dependence on renal dialysis: Secondary | ICD-10-CM | POA: Diagnosis not present

## 2023-12-22 DIAGNOSIS — T8612 Kidney transplant failure: Secondary | ICD-10-CM | POA: Diagnosis not present

## 2023-12-22 DIAGNOSIS — N186 End stage renal disease: Secondary | ICD-10-CM | POA: Diagnosis not present

## 2023-12-23 DIAGNOSIS — R52 Pain, unspecified: Secondary | ICD-10-CM | POA: Diagnosis not present

## 2023-12-23 DIAGNOSIS — Z992 Dependence on renal dialysis: Secondary | ICD-10-CM | POA: Diagnosis not present

## 2023-12-23 DIAGNOSIS — N186 End stage renal disease: Secondary | ICD-10-CM | POA: Diagnosis not present

## 2023-12-23 DIAGNOSIS — D631 Anemia in chronic kidney disease: Secondary | ICD-10-CM | POA: Diagnosis not present

## 2023-12-23 DIAGNOSIS — D689 Coagulation defect, unspecified: Secondary | ICD-10-CM | POA: Diagnosis not present

## 2023-12-23 DIAGNOSIS — N2581 Secondary hyperparathyroidism of renal origin: Secondary | ICD-10-CM | POA: Diagnosis not present

## 2023-12-23 DIAGNOSIS — E1129 Type 2 diabetes mellitus with other diabetic kidney complication: Secondary | ICD-10-CM | POA: Diagnosis not present

## 2023-12-24 DIAGNOSIS — K529 Noninfective gastroenteritis and colitis, unspecified: Secondary | ICD-10-CM | POA: Diagnosis not present

## 2023-12-25 DIAGNOSIS — D689 Coagulation defect, unspecified: Secondary | ICD-10-CM | POA: Diagnosis not present

## 2023-12-25 DIAGNOSIS — N2581 Secondary hyperparathyroidism of renal origin: Secondary | ICD-10-CM | POA: Diagnosis not present

## 2023-12-25 DIAGNOSIS — R52 Pain, unspecified: Secondary | ICD-10-CM | POA: Diagnosis not present

## 2023-12-25 DIAGNOSIS — D631 Anemia in chronic kidney disease: Secondary | ICD-10-CM | POA: Diagnosis not present

## 2023-12-25 DIAGNOSIS — Z992 Dependence on renal dialysis: Secondary | ICD-10-CM | POA: Diagnosis not present

## 2023-12-25 DIAGNOSIS — E1129 Type 2 diabetes mellitus with other diabetic kidney complication: Secondary | ICD-10-CM | POA: Diagnosis not present

## 2023-12-25 DIAGNOSIS — N186 End stage renal disease: Secondary | ICD-10-CM | POA: Diagnosis not present

## 2023-12-27 DIAGNOSIS — D631 Anemia in chronic kidney disease: Secondary | ICD-10-CM | POA: Diagnosis not present

## 2023-12-27 DIAGNOSIS — R52 Pain, unspecified: Secondary | ICD-10-CM | POA: Diagnosis not present

## 2023-12-27 DIAGNOSIS — Z992 Dependence on renal dialysis: Secondary | ICD-10-CM | POA: Diagnosis not present

## 2023-12-27 DIAGNOSIS — E1129 Type 2 diabetes mellitus with other diabetic kidney complication: Secondary | ICD-10-CM | POA: Diagnosis not present

## 2023-12-27 DIAGNOSIS — D689 Coagulation defect, unspecified: Secondary | ICD-10-CM | POA: Diagnosis not present

## 2023-12-27 DIAGNOSIS — N2581 Secondary hyperparathyroidism of renal origin: Secondary | ICD-10-CM | POA: Diagnosis not present

## 2023-12-27 DIAGNOSIS — N186 End stage renal disease: Secondary | ICD-10-CM | POA: Diagnosis not present

## 2023-12-30 DIAGNOSIS — D631 Anemia in chronic kidney disease: Secondary | ICD-10-CM | POA: Diagnosis not present

## 2023-12-30 DIAGNOSIS — E1129 Type 2 diabetes mellitus with other diabetic kidney complication: Secondary | ICD-10-CM | POA: Diagnosis not present

## 2023-12-30 DIAGNOSIS — N186 End stage renal disease: Secondary | ICD-10-CM | POA: Diagnosis not present

## 2023-12-30 DIAGNOSIS — Z992 Dependence on renal dialysis: Secondary | ICD-10-CM | POA: Diagnosis not present

## 2023-12-30 DIAGNOSIS — N2581 Secondary hyperparathyroidism of renal origin: Secondary | ICD-10-CM | POA: Diagnosis not present

## 2023-12-30 DIAGNOSIS — D689 Coagulation defect, unspecified: Secondary | ICD-10-CM | POA: Diagnosis not present

## 2023-12-30 DIAGNOSIS — R52 Pain, unspecified: Secondary | ICD-10-CM | POA: Diagnosis not present

## 2024-01-02 DIAGNOSIS — Z992 Dependence on renal dialysis: Secondary | ICD-10-CM | POA: Diagnosis not present

## 2024-01-02 DIAGNOSIS — D631 Anemia in chronic kidney disease: Secondary | ICD-10-CM | POA: Diagnosis not present

## 2024-01-02 DIAGNOSIS — N186 End stage renal disease: Secondary | ICD-10-CM | POA: Diagnosis not present

## 2024-01-02 DIAGNOSIS — R52 Pain, unspecified: Secondary | ICD-10-CM | POA: Diagnosis not present

## 2024-01-02 DIAGNOSIS — N2581 Secondary hyperparathyroidism of renal origin: Secondary | ICD-10-CM | POA: Diagnosis not present

## 2024-01-02 DIAGNOSIS — D689 Coagulation defect, unspecified: Secondary | ICD-10-CM | POA: Diagnosis not present

## 2024-01-02 DIAGNOSIS — E1129 Type 2 diabetes mellitus with other diabetic kidney complication: Secondary | ICD-10-CM | POA: Diagnosis not present

## 2024-01-06 DIAGNOSIS — R52 Pain, unspecified: Secondary | ICD-10-CM | POA: Diagnosis not present

## 2024-01-06 DIAGNOSIS — D689 Coagulation defect, unspecified: Secondary | ICD-10-CM | POA: Diagnosis not present

## 2024-01-06 DIAGNOSIS — Z992 Dependence on renal dialysis: Secondary | ICD-10-CM | POA: Diagnosis not present

## 2024-01-06 DIAGNOSIS — N186 End stage renal disease: Secondary | ICD-10-CM | POA: Diagnosis not present

## 2024-01-06 DIAGNOSIS — N2581 Secondary hyperparathyroidism of renal origin: Secondary | ICD-10-CM | POA: Diagnosis not present

## 2024-01-06 DIAGNOSIS — D631 Anemia in chronic kidney disease: Secondary | ICD-10-CM | POA: Diagnosis not present

## 2024-01-06 DIAGNOSIS — E1129 Type 2 diabetes mellitus with other diabetic kidney complication: Secondary | ICD-10-CM | POA: Diagnosis not present

## 2024-01-08 DIAGNOSIS — D631 Anemia in chronic kidney disease: Secondary | ICD-10-CM | POA: Diagnosis not present

## 2024-01-08 DIAGNOSIS — N2581 Secondary hyperparathyroidism of renal origin: Secondary | ICD-10-CM | POA: Diagnosis not present

## 2024-01-08 DIAGNOSIS — N186 End stage renal disease: Secondary | ICD-10-CM | POA: Diagnosis not present

## 2024-01-08 DIAGNOSIS — Z992 Dependence on renal dialysis: Secondary | ICD-10-CM | POA: Diagnosis not present

## 2024-01-08 DIAGNOSIS — R52 Pain, unspecified: Secondary | ICD-10-CM | POA: Diagnosis not present

## 2024-01-08 DIAGNOSIS — E1129 Type 2 diabetes mellitus with other diabetic kidney complication: Secondary | ICD-10-CM | POA: Diagnosis not present

## 2024-01-08 DIAGNOSIS — D689 Coagulation defect, unspecified: Secondary | ICD-10-CM | POA: Diagnosis not present

## 2024-01-09 ENCOUNTER — Other Ambulatory Visit: Payer: Self-pay | Admitting: Pulmonary Disease

## 2024-01-10 DIAGNOSIS — D631 Anemia in chronic kidney disease: Secondary | ICD-10-CM | POA: Diagnosis not present

## 2024-01-10 DIAGNOSIS — E1129 Type 2 diabetes mellitus with other diabetic kidney complication: Secondary | ICD-10-CM | POA: Diagnosis not present

## 2024-01-10 DIAGNOSIS — D689 Coagulation defect, unspecified: Secondary | ICD-10-CM | POA: Diagnosis not present

## 2024-01-10 DIAGNOSIS — N186 End stage renal disease: Secondary | ICD-10-CM | POA: Diagnosis not present

## 2024-01-10 DIAGNOSIS — R52 Pain, unspecified: Secondary | ICD-10-CM | POA: Diagnosis not present

## 2024-01-10 DIAGNOSIS — N2581 Secondary hyperparathyroidism of renal origin: Secondary | ICD-10-CM | POA: Diagnosis not present

## 2024-01-10 DIAGNOSIS — Z992 Dependence on renal dialysis: Secondary | ICD-10-CM | POA: Diagnosis not present

## 2024-01-13 DIAGNOSIS — R52 Pain, unspecified: Secondary | ICD-10-CM | POA: Diagnosis not present

## 2024-01-13 DIAGNOSIS — Z992 Dependence on renal dialysis: Secondary | ICD-10-CM | POA: Diagnosis not present

## 2024-01-13 DIAGNOSIS — N2581 Secondary hyperparathyroidism of renal origin: Secondary | ICD-10-CM | POA: Diagnosis not present

## 2024-01-13 DIAGNOSIS — D689 Coagulation defect, unspecified: Secondary | ICD-10-CM | POA: Diagnosis not present

## 2024-01-13 DIAGNOSIS — N186 End stage renal disease: Secondary | ICD-10-CM | POA: Diagnosis not present

## 2024-01-13 DIAGNOSIS — E1129 Type 2 diabetes mellitus with other diabetic kidney complication: Secondary | ICD-10-CM | POA: Diagnosis not present

## 2024-01-13 DIAGNOSIS — D631 Anemia in chronic kidney disease: Secondary | ICD-10-CM | POA: Diagnosis not present

## 2024-01-17 DIAGNOSIS — N2581 Secondary hyperparathyroidism of renal origin: Secondary | ICD-10-CM | POA: Diagnosis not present

## 2024-01-17 DIAGNOSIS — D631 Anemia in chronic kidney disease: Secondary | ICD-10-CM | POA: Diagnosis not present

## 2024-01-17 DIAGNOSIS — Z992 Dependence on renal dialysis: Secondary | ICD-10-CM | POA: Diagnosis not present

## 2024-01-17 DIAGNOSIS — R52 Pain, unspecified: Secondary | ICD-10-CM | POA: Diagnosis not present

## 2024-01-17 DIAGNOSIS — N186 End stage renal disease: Secondary | ICD-10-CM | POA: Diagnosis not present

## 2024-01-17 DIAGNOSIS — D689 Coagulation defect, unspecified: Secondary | ICD-10-CM | POA: Diagnosis not present

## 2024-01-17 DIAGNOSIS — E1129 Type 2 diabetes mellitus with other diabetic kidney complication: Secondary | ICD-10-CM | POA: Diagnosis not present

## 2024-01-20 DIAGNOSIS — N2581 Secondary hyperparathyroidism of renal origin: Secondary | ICD-10-CM | POA: Diagnosis not present

## 2024-01-20 DIAGNOSIS — D631 Anemia in chronic kidney disease: Secondary | ICD-10-CM | POA: Diagnosis not present

## 2024-01-20 DIAGNOSIS — D689 Coagulation defect, unspecified: Secondary | ICD-10-CM | POA: Diagnosis not present

## 2024-01-20 DIAGNOSIS — E1129 Type 2 diabetes mellitus with other diabetic kidney complication: Secondary | ICD-10-CM | POA: Diagnosis not present

## 2024-01-20 DIAGNOSIS — N186 End stage renal disease: Secondary | ICD-10-CM | POA: Diagnosis not present

## 2024-01-20 DIAGNOSIS — Z992 Dependence on renal dialysis: Secondary | ICD-10-CM | POA: Diagnosis not present

## 2024-01-20 DIAGNOSIS — R52 Pain, unspecified: Secondary | ICD-10-CM | POA: Diagnosis not present

## 2024-01-21 DIAGNOSIS — T8612 Kidney transplant failure: Secondary | ICD-10-CM | POA: Diagnosis not present

## 2024-01-21 DIAGNOSIS — Z992 Dependence on renal dialysis: Secondary | ICD-10-CM | POA: Diagnosis not present

## 2024-01-21 DIAGNOSIS — N186 End stage renal disease: Secondary | ICD-10-CM | POA: Diagnosis not present

## 2024-01-22 DIAGNOSIS — Z992 Dependence on renal dialysis: Secondary | ICD-10-CM | POA: Diagnosis not present

## 2024-01-22 DIAGNOSIS — N2581 Secondary hyperparathyroidism of renal origin: Secondary | ICD-10-CM | POA: Diagnosis not present

## 2024-01-22 DIAGNOSIS — E039 Hypothyroidism, unspecified: Secondary | ICD-10-CM | POA: Diagnosis not present

## 2024-01-22 DIAGNOSIS — D689 Coagulation defect, unspecified: Secondary | ICD-10-CM | POA: Diagnosis not present

## 2024-01-22 DIAGNOSIS — D631 Anemia in chronic kidney disease: Secondary | ICD-10-CM | POA: Diagnosis not present

## 2024-01-22 DIAGNOSIS — N186 End stage renal disease: Secondary | ICD-10-CM | POA: Diagnosis not present

## 2024-01-24 DIAGNOSIS — Z992 Dependence on renal dialysis: Secondary | ICD-10-CM | POA: Diagnosis not present

## 2024-01-24 DIAGNOSIS — D689 Coagulation defect, unspecified: Secondary | ICD-10-CM | POA: Diagnosis not present

## 2024-01-24 DIAGNOSIS — N186 End stage renal disease: Secondary | ICD-10-CM | POA: Diagnosis not present

## 2024-01-24 DIAGNOSIS — D631 Anemia in chronic kidney disease: Secondary | ICD-10-CM | POA: Diagnosis not present

## 2024-01-24 DIAGNOSIS — N2581 Secondary hyperparathyroidism of renal origin: Secondary | ICD-10-CM | POA: Diagnosis not present

## 2024-01-24 DIAGNOSIS — E039 Hypothyroidism, unspecified: Secondary | ICD-10-CM | POA: Diagnosis not present

## 2024-01-27 DIAGNOSIS — N2581 Secondary hyperparathyroidism of renal origin: Secondary | ICD-10-CM | POA: Diagnosis not present

## 2024-01-27 DIAGNOSIS — E039 Hypothyroidism, unspecified: Secondary | ICD-10-CM | POA: Diagnosis not present

## 2024-01-27 DIAGNOSIS — D689 Coagulation defect, unspecified: Secondary | ICD-10-CM | POA: Diagnosis not present

## 2024-01-27 DIAGNOSIS — N186 End stage renal disease: Secondary | ICD-10-CM | POA: Diagnosis not present

## 2024-01-27 DIAGNOSIS — Z992 Dependence on renal dialysis: Secondary | ICD-10-CM | POA: Diagnosis not present

## 2024-01-27 DIAGNOSIS — D631 Anemia in chronic kidney disease: Secondary | ICD-10-CM | POA: Diagnosis not present

## 2024-01-31 DIAGNOSIS — Z992 Dependence on renal dialysis: Secondary | ICD-10-CM | POA: Diagnosis not present

## 2024-01-31 DIAGNOSIS — D631 Anemia in chronic kidney disease: Secondary | ICD-10-CM | POA: Diagnosis not present

## 2024-01-31 DIAGNOSIS — N2581 Secondary hyperparathyroidism of renal origin: Secondary | ICD-10-CM | POA: Diagnosis not present

## 2024-01-31 DIAGNOSIS — E039 Hypothyroidism, unspecified: Secondary | ICD-10-CM | POA: Diagnosis not present

## 2024-01-31 DIAGNOSIS — D689 Coagulation defect, unspecified: Secondary | ICD-10-CM | POA: Diagnosis not present

## 2024-01-31 DIAGNOSIS — N186 End stage renal disease: Secondary | ICD-10-CM | POA: Diagnosis not present

## 2024-02-03 ENCOUNTER — Encounter (HOSPITAL_COMMUNITY): Payer: Self-pay

## 2024-02-03 ENCOUNTER — Emergency Department (HOSPITAL_COMMUNITY)

## 2024-02-03 ENCOUNTER — Emergency Department (HOSPITAL_COMMUNITY)
Admission: EM | Admit: 2024-02-03 | Discharge: 2024-02-03 | Disposition: A | Discharge status: Home / Self Care | Attending: Emergency Medicine | Admitting: Emergency Medicine

## 2024-02-03 ENCOUNTER — Other Ambulatory Visit: Payer: Self-pay

## 2024-02-03 DIAGNOSIS — E119 Type 2 diabetes mellitus without complications: Secondary | ICD-10-CM | POA: Insufficient documentation

## 2024-02-03 DIAGNOSIS — R600 Localized edema: Secondary | ICD-10-CM | POA: Diagnosis not present

## 2024-02-03 DIAGNOSIS — N186 End stage renal disease: Secondary | ICD-10-CM | POA: Diagnosis not present

## 2024-02-03 DIAGNOSIS — D689 Coagulation defect, unspecified: Secondary | ICD-10-CM | POA: Diagnosis not present

## 2024-02-03 DIAGNOSIS — E039 Hypothyroidism, unspecified: Secondary | ICD-10-CM | POA: Diagnosis not present

## 2024-02-03 DIAGNOSIS — E8809 Other disorders of plasma-protein metabolism, not elsewhere classified: Secondary | ICD-10-CM | POA: Diagnosis not present

## 2024-02-03 DIAGNOSIS — E161 Other hypoglycemia: Secondary | ICD-10-CM | POA: Diagnosis not present

## 2024-02-03 DIAGNOSIS — M7989 Other specified soft tissue disorders: Secondary | ICD-10-CM | POA: Diagnosis not present

## 2024-02-03 DIAGNOSIS — N2581 Secondary hyperparathyroidism of renal origin: Secondary | ICD-10-CM | POA: Diagnosis not present

## 2024-02-03 DIAGNOSIS — R42 Dizziness and giddiness: Secondary | ICD-10-CM

## 2024-02-03 DIAGNOSIS — E162 Hypoglycemia, unspecified: Secondary | ICD-10-CM | POA: Diagnosis not present

## 2024-02-03 DIAGNOSIS — T68XXXA Hypothermia, initial encounter: Secondary | ICD-10-CM

## 2024-02-03 DIAGNOSIS — D631 Anemia in chronic kidney disease: Secondary | ICD-10-CM | POA: Diagnosis not present

## 2024-02-03 DIAGNOSIS — Z992 Dependence on renal dialysis: Secondary | ICD-10-CM | POA: Diagnosis not present

## 2024-02-03 DIAGNOSIS — Z0389 Encounter for observation for other suspected diseases and conditions ruled out: Secondary | ICD-10-CM | POA: Diagnosis not present

## 2024-02-03 LAB — COMPREHENSIVE METABOLIC PANEL WITH GFR
ALT: <5 U/L (ref 0–44)
AST: 13 U/L — ABNORMAL LOW (ref 15–41)
Albumin: 2.9 g/dL — ABNORMAL LOW (ref 3.5–5.0)
Alkaline Phosphatase: 83 U/L (ref 38–126)
Anion gap: 15 (ref 5–15)
BUN: 49 mg/dL — ABNORMAL HIGH (ref 8–23)
CO2: 26 mmol/L (ref 22–32)
Calcium: 8.6 mg/dL — ABNORMAL LOW (ref 8.9–10.3)
Chloride: 95 mmol/L — ABNORMAL LOW (ref 98–111)
Creatinine, Ser: 9.16 mg/dL — ABNORMAL HIGH (ref 0.44–1.00)
GFR, Estimated: 4 mL/min — ABNORMAL LOW (ref >60)
Glucose, Bld: 88 mg/dL (ref 70–99)
Potassium: 5.1 mmol/L (ref 3.5–5.1)
Sodium: 136 mmol/L (ref 135–145)
Total Bilirubin: 0.8 mg/dL (ref 0.0–1.2)
Total Protein: 5.7 g/dL — ABNORMAL LOW (ref 6.5–8.1)

## 2024-02-03 LAB — CBC WITH DIFFERENTIAL/PLATELET
Abs Immature Granulocytes: 0.02 10*3/uL (ref 0.00–0.07)
Basophils Absolute: 0 10*3/uL (ref 0.0–0.1)
Basophils Relative: 1 %
Eosinophils Absolute: 0.1 10*3/uL (ref 0.0–0.5)
Eosinophils Relative: 3 %
HCT: 34.8 % — ABNORMAL LOW (ref 36.0–46.0)
Hemoglobin: 10.7 g/dL — ABNORMAL LOW (ref 12.0–15.0)
Immature Granulocytes: 0 %
Lymphocytes Relative: 14 %
Lymphs Abs: 0.6 10*3/uL — ABNORMAL LOW (ref 0.7–4.0)
MCH: 29.2 pg (ref 26.0–34.0)
MCHC: 30.7 g/dL (ref 30.0–36.0)
MCV: 94.8 fL (ref 80.0–100.0)
Monocytes Absolute: 0.4 10*3/uL (ref 0.1–1.0)
Monocytes Relative: 8 %
Neutro Abs: 3.3 10*3/uL (ref 1.7–7.7)
Neutrophils Relative %: 74 %
Platelets: 152 10*3/uL (ref 150–400)
RBC: 3.67 MIL/uL — ABNORMAL LOW (ref 3.87–5.11)
RDW: 14.8 % (ref 11.5–15.5)
WBC: 4.5 10*3/uL (ref 4.0–10.5)
nRBC: 0 % (ref 0.0–0.2)

## 2024-02-03 LAB — I-STAT CG4 LACTIC ACID, ED
Lactic Acid, Venous: 2.2 mmol/L (ref 0.5–1.9)
Lactic Acid, Venous: 1.1 mmol/L (ref 0.5–1.9)

## 2024-02-03 LAB — PROTIME-INR
INR: 1.1 (ref 0.8–1.2)
Prothrombin Time: 14.4 s (ref 11.4–15.2)

## 2024-02-03 NOTE — ED Notes (Addendum)
 Pt requesting bair hugger to be turned off. Oral temp taken and read 97.1. Lawyer on standby. Pt also states she does not make urine so urinalysis cancelled, MD Liam Redhead notified

## 2024-02-03 NOTE — ED Notes (Signed)
 Bair hugger applied.

## 2024-02-03 NOTE — Discharge Instructions (Signed)
 As discussed, your evaluation today has been largely reassuring.  But, it is important that you monitor your condition carefully, and do not hesitate to return to the ED if you develop new, or concerning changes in your condition. ? ?Otherwise, please follow-up with your physician for appropriate ongoing care. ? ?

## 2024-02-03 NOTE — ED Provider Notes (Signed)
 [place] EMERGENCY DEPARTMENT AT [facility] Provider Note   CSN: [id number] Arrival date & time: 02/03/24  4098     History  Chief Complaint  Patient presents with   Dizziness    Kimberly [name] is a 68 y.o. female.  HPI Patient presents concern of near syncope/lightheadedness. She notes that she did dialysis 3 days ago as scheduled, missed the penultimate session, however. After awakening, getting ready for dialysis patient felt lightheaded as though she has been lose consciousness.  No pain.  She called for transport, and on arrival, continues to have no pain, does describe weakness. No recent substantial change in condition, but she does note that she has been suspicious of an infection for some time, without demonstrated.    Home Medications Prior to Admission medications   Medication Sig Start Date End Date Taking? Authorizing Provider  albuterol  (VENTOLIN  HFA) 108 (90 Base) MCG/ACT inhaler INHALE 2 PUFFS INTO THE LUNGS EVERY 6 HOURS AS NEEDED FOR SHORTNESS OF BREATH OR WHEEZING(ASTHMA) 01/10/24   Mannam, Praveen, MD  amLODipine  (NORVASC ) 10 MG tablet Take 10 mg by mouth every evening. 09/21/21   [provider]  budesonide -formoterol  (SYMBICORT ) 160-4.5 MCG/ACT inhaler INHALE 2 PUFFS INTO THE LUNGS TWICE DAILY 09/15/23   Wert, Michael B, MD  cinacalcet  (SENSIPAR ) 60 MG tablet Take 60 mg by mouth daily after supper. Taking 60mg  daily    [provider]  EPOETIN  ALFA IJ Inject into the skin every Tuesday, Thursday, and Saturday at 6 PM. Inject  subcutaneously at dialysis as needed for low hemoglobin    [provider]  hydroxychloroquine  (PLAQUENIL ) 200 MG tablet Take 200 mg by mouth in the morning.    [provider]  levothyroxine  (SYNTHROID , LEVOTHROID) 50 MCG tablet Take 50-75 mcg by mouth See admin instructions. Take  1 1/2 tablets (75 mcg) by mouth daily on Tuesday and Thursday nights, take 1 tablet (50 mcg) by mouth daily on  all other nights of the week    [provider]  losartan (COZAAR) 100 MG tablet Take 100 mg by mouth in the morning.    [provider]  OVER THE COUNTER MEDICATION Take 2 capsules by mouth in the morning, at noon, in the evening, and at bedtime. Juice plus - 2 fruit, 2 vegetable, 2 berries, 2 omega    [provider]  oxyCODONE -acetaminophen  (PERCOCET) 5-325 MG tablet Take 1 tablet by mouth every 6 (six) hours as needed for severe pain. 03/28/23 03/27/24  Baglia, Corrina, PA-C  predniSONE  (DELTASONE ) 5 MG tablet Take 5 mg by mouth daily with breakfast.    [provider]  Probiotic Product (PROBIOTIC DAILY PO) Take 1 tablet by mouth every evening.    [provider]  Propylene Glycol (SYSTANE BALANCE OP) Place 1-2 drops into both eyes 3 (three) times daily as needed (dry/irritated eyes.).    [provider]  QUERCETIN PO Take 1 capsule by mouth in the morning.    [provider]  sevelamer carbonate (RENVELA) 800 MG tablet Take 800-1,600 mg by mouth See admin instructions. TAKE 2 TABLETS (1600 MG) BY MOUTH THREE TIMES DAILY WITH MEALS AND TAKE 1 TABLET (800 MG) BY MOUTH WITH SNACKS    [provider]  tiotropium (SPIRIVA ) 18 MCG inhalation capsule PLACE 1 CAPSULE INTO INHALER AND INHALE DAILY 10/20/23   Mannam, Praveen, MD  Zinc Chelated 22.5 MG TABS Take 22.5 mg by mouth in the morning.    [provider]  Allergies    Mircera [methoxy polyethylene glycol-epoetin  beta], Mycophenolate  mofetil, Dapsone, Pregabalin, Sulfonamide derivatives, and Tramadol     Review of Systems   Review of Systems  Physical Exam Updated Vital Signs BP (!) 148/70 (BP Location: Left Arm)   Pulse (!) 58   Temp (!) 97.1 F (36.2 C) (Oral)   Resp 12   Ht 5\' 5"  (1.651 m)   Wt 43.5 kg   SpO2 100%   BMI 15.98 kg/m  Physical Exam Vitals and nursing note reviewed.  Constitutional:      General: She is not in acute distress.     Appearance: She is well-developed. She is ill-appearing. She is not toxic-appearing or diaphoretic.  HENT:     Head: Normocephalic and atraumatic.  Eyes:     Conjunctiva/sclera: Conjunctivae normal.  Cardiovascular:     Rate and Rhythm: Normal rate and regular rhythm.  Pulmonary:     Effort: Pulmonary effort is normal. No respiratory distress.     Breath sounds: Normal breath sounds. No stridor.  Abdominal:     General: There is no distension.  Skin:    General: Skin is warm and dry.  Neurological:     Mental Status: She is alert and oriented to person, place, and time.     Cranial Nerves: No cranial nerve deficit.  Psychiatric:        Mood and Affect: Mood normal.     ED Results / Procedures / Treatments   Labs (all labs ordered are listed, but only abnormal results are displayed) Labs Reviewed  CBC WITH DIFFERENTIAL/PLATELET - Abnormal; Notable for the following components:      Result Value   RBC 3.67 (*)    Hemoglobin 10.7 (*)    HCT 34.8 (*)    Lymphs Abs 0.6 (*)    All other components within normal limits  COMPREHENSIVE METABOLIC PANEL WITH GFR - Abnormal; Notable for the following components:   Chloride 95 (*)    BUN 49 (*)    Creatinine, Ser 9.16 (*)    Calcium  8.6 (*)    Total Protein 5.7 (*)    Albumin 2.9 (*)    AST 13 (*)    GFR, Estimated 4 (*)    All other components within normal limits  I-STAT CG4 LACTIC ACID, ED - Abnormal; Notable for the following components:   Lactic Acid, Venous 2.2 (*)    All other components within normal limits  CULTURE, BLOOD (ROUTINE X 2)  CULTURE, BLOOD (ROUTINE X 2)  PROTIME-INR  I-STAT CG4 LACTIC ACID, ED    EKG EKG Interpretation Date/Time:  Tuesday Feb 03 2024 07:55:36 EDT Ventricular Rate:  56 PR Interval:  214 QRS Duration:  117 QT Interval:  503 QTC Calculation: 486 R Axis:   108  Text Interpretation: Sinus rhythm Borderline prolonged PR interval Nonspecific intraventricular conduction delay Nonspecific  T abnrm, anterolateral leads Confirmed by Dorenda Gandy [phone]) on 02/03/2024 8:34:44 AM  Radiology DG Chest Port 1 View Result Date: 02/03/2024 CLINICAL DATA:  Questionable sepsis EXAM: PORTABLE CHEST 1 VIEW COMPARISON:  August 07, 2021 FINDINGS: The heart size and mediastinal contours are within normal limits. Both lungs are clear. The visualized skeletal structures are unremarkable. Prominence of the interstitial markings correlate with chronic interstitial lung disease and COPD changes IMPRESSION: No active disease. Electronically Signed   By: Fredrich Jefferson M.D.   On: 02/03/2024 08:26    Procedures Procedures    Medications Ordered in ED Medications - No data to  display  ED Course/ Medical Decision Making/ A&P                                 Medical Decision Making Adult female in stage renal disease now presents with weakness, near syncope.  Broad differential including electrolyte abnormalities, fluid shift, ACS, infection, bacteremia, sepsis.  Patient hypothermic on arrival, 91 degrees, started on Bair hugger, labs cultures x-ray ECG all ordered. Cardiac 60 sinus normal pulse ox 100% room air normal  Amount and/or Complexity of Data Reviewed External Data Reviewed: notes. Labs: ordered. Decision-making details documented in ED Course. Radiology: ordered and independent interpretation performed. Decision-making details documented in ED Course. ECG/medicine tests: ordered and independent interpretation performed. Decision-making details documented in ED Course.   11:01 AM Patient awake, alert, hemodynamically unremarkable, lactic acidosis which was trivially elevated has now completely normalized, temperature has normalized, she has no ongoing complaints.  We lengthy conversation about the patient's presentation and she again describes that for the past few years she has had episodes of feverishness, sense of infection, but multiple prior evaluations including blood cultures  have not demonstrated obvious cause.  She, her husband and I discussed findings, reassuring lab results, no substantial electrolyte abnormalities, no substantial CBC abnormalities, we discussed admission for ongoing monitoring given her presentation with hypothermia, dizziness, versus discharged with outpatient follow-up the patient has a preference for the latter, which is reasonable given her improvement, absence of ongoing complaints, patient discharged in stable condition to follow-up closely with primary care and for dialysis later today or tomorrow.  Blood culture sent for occult infection.        Final Clinical Impression(s) / ED Diagnoses Final diagnoses:  Dizziness  Hypothermia, initial encounter    Rx / DC Orders ED Discharge Orders     None         Dorenda Gandy, MD 02/03/24 1101

## 2024-02-03 NOTE — ED Triage Notes (Signed)
 Pt BIB [facility] from home for severe dizziness.  Pt is a dialysis pt and was supposed to go this morning. PT does not make urine.  Pt CBG was 66 but is not DM and only ate a doughnut yesterday.  Pt has had chills like she has a fever but not warm to touch per EMS.  130/70 98%, HR 48-55

## 2024-02-05 DIAGNOSIS — D631 Anemia in chronic kidney disease: Secondary | ICD-10-CM | POA: Diagnosis not present

## 2024-02-05 DIAGNOSIS — Z992 Dependence on renal dialysis: Secondary | ICD-10-CM | POA: Diagnosis not present

## 2024-02-05 DIAGNOSIS — E039 Hypothyroidism, unspecified: Secondary | ICD-10-CM | POA: Diagnosis not present

## 2024-02-05 DIAGNOSIS — N2581 Secondary hyperparathyroidism of renal origin: Secondary | ICD-10-CM | POA: Diagnosis not present

## 2024-02-05 DIAGNOSIS — N186 End stage renal disease: Secondary | ICD-10-CM | POA: Diagnosis not present

## 2024-02-05 DIAGNOSIS — D689 Coagulation defect, unspecified: Secondary | ICD-10-CM | POA: Diagnosis not present

## 2024-02-07 DIAGNOSIS — N186 End stage renal disease: Secondary | ICD-10-CM | POA: Diagnosis not present

## 2024-02-07 DIAGNOSIS — D689 Coagulation defect, unspecified: Secondary | ICD-10-CM | POA: Diagnosis not present

## 2024-02-07 DIAGNOSIS — E039 Hypothyroidism, unspecified: Secondary | ICD-10-CM | POA: Diagnosis not present

## 2024-02-07 DIAGNOSIS — D631 Anemia in chronic kidney disease: Secondary | ICD-10-CM | POA: Diagnosis not present

## 2024-02-07 DIAGNOSIS — N2581 Secondary hyperparathyroidism of renal origin: Secondary | ICD-10-CM | POA: Diagnosis not present

## 2024-02-07 DIAGNOSIS — Z992 Dependence on renal dialysis: Secondary | ICD-10-CM | POA: Diagnosis not present

## 2024-02-08 LAB — CULTURE, BLOOD (ROUTINE X 2)
Culture: NO GROWTH
Culture: NO GROWTH

## 2024-02-10 DIAGNOSIS — D631 Anemia in chronic kidney disease: Secondary | ICD-10-CM | POA: Diagnosis not present

## 2024-02-10 DIAGNOSIS — N186 End stage renal disease: Secondary | ICD-10-CM | POA: Diagnosis not present

## 2024-02-10 DIAGNOSIS — E039 Hypothyroidism, unspecified: Secondary | ICD-10-CM | POA: Diagnosis not present

## 2024-02-10 DIAGNOSIS — N2581 Secondary hyperparathyroidism of renal origin: Secondary | ICD-10-CM | POA: Diagnosis not present

## 2024-02-10 DIAGNOSIS — D689 Coagulation defect, unspecified: Secondary | ICD-10-CM | POA: Diagnosis not present

## 2024-02-10 DIAGNOSIS — Z992 Dependence on renal dialysis: Secondary | ICD-10-CM | POA: Diagnosis not present

## 2024-02-14 DIAGNOSIS — E039 Hypothyroidism, unspecified: Secondary | ICD-10-CM | POA: Diagnosis not present

## 2024-02-14 DIAGNOSIS — D689 Coagulation defect, unspecified: Secondary | ICD-10-CM | POA: Diagnosis not present

## 2024-02-14 DIAGNOSIS — Z992 Dependence on renal dialysis: Secondary | ICD-10-CM | POA: Diagnosis not present

## 2024-02-14 DIAGNOSIS — D631 Anemia in chronic kidney disease: Secondary | ICD-10-CM | POA: Diagnosis not present

## 2024-02-14 DIAGNOSIS — N186 End stage renal disease: Secondary | ICD-10-CM | POA: Diagnosis not present

## 2024-02-14 DIAGNOSIS — N2581 Secondary hyperparathyroidism of renal origin: Secondary | ICD-10-CM | POA: Diagnosis not present

## 2024-02-16 DIAGNOSIS — E039 Hypothyroidism, unspecified: Secondary | ICD-10-CM | POA: Diagnosis not present

## 2024-02-16 DIAGNOSIS — Z992 Dependence on renal dialysis: Secondary | ICD-10-CM | POA: Diagnosis not present

## 2024-02-16 DIAGNOSIS — D689 Coagulation defect, unspecified: Secondary | ICD-10-CM | POA: Diagnosis not present

## 2024-02-16 DIAGNOSIS — N2581 Secondary hyperparathyroidism of renal origin: Secondary | ICD-10-CM | POA: Diagnosis not present

## 2024-02-16 DIAGNOSIS — D631 Anemia in chronic kidney disease: Secondary | ICD-10-CM | POA: Diagnosis not present

## 2024-02-16 DIAGNOSIS — N186 End stage renal disease: Secondary | ICD-10-CM | POA: Diagnosis not present

## 2024-02-17 DIAGNOSIS — Z992 Dependence on renal dialysis: Secondary | ICD-10-CM | POA: Diagnosis not present

## 2024-02-17 DIAGNOSIS — N186 End stage renal disease: Secondary | ICD-10-CM | POA: Diagnosis not present

## 2024-02-17 DIAGNOSIS — D689 Coagulation defect, unspecified: Secondary | ICD-10-CM | POA: Diagnosis not present

## 2024-02-17 DIAGNOSIS — E039 Hypothyroidism, unspecified: Secondary | ICD-10-CM | POA: Diagnosis not present

## 2024-02-17 DIAGNOSIS — N2581 Secondary hyperparathyroidism of renal origin: Secondary | ICD-10-CM | POA: Diagnosis not present

## 2024-02-17 DIAGNOSIS — D631 Anemia in chronic kidney disease: Secondary | ICD-10-CM | POA: Diagnosis not present

## 2024-02-18 DIAGNOSIS — A319 Mycobacterial infection, unspecified: Secondary | ICD-10-CM | POA: Diagnosis not present

## 2024-02-18 DIAGNOSIS — M3501 Sicca syndrome with keratoconjunctivitis: Secondary | ICD-10-CM | POA: Diagnosis not present

## 2024-02-18 DIAGNOSIS — M0579 Rheumatoid arthritis with rheumatoid factor of multiple sites without organ or systems involvement: Secondary | ICD-10-CM | POA: Diagnosis not present

## 2024-02-18 DIAGNOSIS — N186 End stage renal disease: Secondary | ICD-10-CM | POA: Diagnosis not present

## 2024-02-19 DIAGNOSIS — Z992 Dependence on renal dialysis: Secondary | ICD-10-CM | POA: Diagnosis not present

## 2024-02-19 DIAGNOSIS — N2581 Secondary hyperparathyroidism of renal origin: Secondary | ICD-10-CM | POA: Diagnosis not present

## 2024-02-19 DIAGNOSIS — N186 End stage renal disease: Secondary | ICD-10-CM | POA: Diagnosis not present

## 2024-02-19 DIAGNOSIS — E039 Hypothyroidism, unspecified: Secondary | ICD-10-CM | POA: Diagnosis not present

## 2024-02-19 DIAGNOSIS — D689 Coagulation defect, unspecified: Secondary | ICD-10-CM | POA: Diagnosis not present

## 2024-02-19 DIAGNOSIS — D631 Anemia in chronic kidney disease: Secondary | ICD-10-CM | POA: Diagnosis not present

## 2024-02-21 DIAGNOSIS — N2581 Secondary hyperparathyroidism of renal origin: Secondary | ICD-10-CM | POA: Diagnosis not present

## 2024-02-21 DIAGNOSIS — Z992 Dependence on renal dialysis: Secondary | ICD-10-CM | POA: Diagnosis not present

## 2024-02-21 DIAGNOSIS — N186 End stage renal disease: Secondary | ICD-10-CM | POA: Diagnosis not present

## 2024-02-21 DIAGNOSIS — D689 Coagulation defect, unspecified: Secondary | ICD-10-CM | POA: Diagnosis not present

## 2024-02-21 DIAGNOSIS — E039 Hypothyroidism, unspecified: Secondary | ICD-10-CM | POA: Diagnosis not present

## 2024-02-21 DIAGNOSIS — D631 Anemia in chronic kidney disease: Secondary | ICD-10-CM | POA: Diagnosis not present

## 2024-02-21 DIAGNOSIS — T8612 Kidney transplant failure: Secondary | ICD-10-CM | POA: Diagnosis not present

## 2024-02-24 DIAGNOSIS — Z992 Dependence on renal dialysis: Secondary | ICD-10-CM | POA: Diagnosis not present

## 2024-02-24 DIAGNOSIS — E039 Hypothyroidism, unspecified: Secondary | ICD-10-CM | POA: Diagnosis not present

## 2024-02-24 DIAGNOSIS — D689 Coagulation defect, unspecified: Secondary | ICD-10-CM | POA: Diagnosis not present

## 2024-02-24 DIAGNOSIS — D631 Anemia in chronic kidney disease: Secondary | ICD-10-CM | POA: Diagnosis not present

## 2024-02-24 DIAGNOSIS — N2581 Secondary hyperparathyroidism of renal origin: Secondary | ICD-10-CM | POA: Diagnosis not present

## 2024-02-24 DIAGNOSIS — N186 End stage renal disease: Secondary | ICD-10-CM | POA: Diagnosis not present

## 2024-02-28 DIAGNOSIS — D631 Anemia in chronic kidney disease: Secondary | ICD-10-CM | POA: Diagnosis not present

## 2024-02-28 DIAGNOSIS — N186 End stage renal disease: Secondary | ICD-10-CM | POA: Diagnosis not present

## 2024-02-28 DIAGNOSIS — E039 Hypothyroidism, unspecified: Secondary | ICD-10-CM | POA: Diagnosis not present

## 2024-02-28 DIAGNOSIS — D689 Coagulation defect, unspecified: Secondary | ICD-10-CM | POA: Diagnosis not present

## 2024-02-28 DIAGNOSIS — Z992 Dependence on renal dialysis: Secondary | ICD-10-CM | POA: Diagnosis not present

## 2024-02-28 DIAGNOSIS — N2581 Secondary hyperparathyroidism of renal origin: Secondary | ICD-10-CM | POA: Diagnosis not present

## 2024-03-02 DIAGNOSIS — D631 Anemia in chronic kidney disease: Secondary | ICD-10-CM | POA: Diagnosis not present

## 2024-03-02 DIAGNOSIS — E039 Hypothyroidism, unspecified: Secondary | ICD-10-CM | POA: Diagnosis not present

## 2024-03-02 DIAGNOSIS — N2581 Secondary hyperparathyroidism of renal origin: Secondary | ICD-10-CM | POA: Diagnosis not present

## 2024-03-02 DIAGNOSIS — Z992 Dependence on renal dialysis: Secondary | ICD-10-CM | POA: Diagnosis not present

## 2024-03-02 DIAGNOSIS — D689 Coagulation defect, unspecified: Secondary | ICD-10-CM | POA: Diagnosis not present

## 2024-03-02 DIAGNOSIS — N186 End stage renal disease: Secondary | ICD-10-CM | POA: Diagnosis not present

## 2024-03-03 DIAGNOSIS — G629 Polyneuropathy, unspecified: Secondary | ICD-10-CM | POA: Diagnosis not present

## 2024-03-04 DIAGNOSIS — D631 Anemia in chronic kidney disease: Secondary | ICD-10-CM | POA: Diagnosis not present

## 2024-03-04 DIAGNOSIS — D689 Coagulation defect, unspecified: Secondary | ICD-10-CM | POA: Diagnosis not present

## 2024-03-04 DIAGNOSIS — N186 End stage renal disease: Secondary | ICD-10-CM | POA: Diagnosis not present

## 2024-03-04 DIAGNOSIS — N2581 Secondary hyperparathyroidism of renal origin: Secondary | ICD-10-CM | POA: Diagnosis not present

## 2024-03-04 DIAGNOSIS — Z992 Dependence on renal dialysis: Secondary | ICD-10-CM | POA: Diagnosis not present

## 2024-03-04 DIAGNOSIS — E039 Hypothyroidism, unspecified: Secondary | ICD-10-CM | POA: Diagnosis not present

## 2024-03-06 DIAGNOSIS — E039 Hypothyroidism, unspecified: Secondary | ICD-10-CM | POA: Diagnosis not present

## 2024-03-06 DIAGNOSIS — N186 End stage renal disease: Secondary | ICD-10-CM | POA: Diagnosis not present

## 2024-03-06 DIAGNOSIS — Z992 Dependence on renal dialysis: Secondary | ICD-10-CM | POA: Diagnosis not present

## 2024-03-06 DIAGNOSIS — D689 Coagulation defect, unspecified: Secondary | ICD-10-CM | POA: Diagnosis not present

## 2024-03-06 DIAGNOSIS — N2581 Secondary hyperparathyroidism of renal origin: Secondary | ICD-10-CM | POA: Diagnosis not present

## 2024-03-06 DIAGNOSIS — D631 Anemia in chronic kidney disease: Secondary | ICD-10-CM | POA: Diagnosis not present

## 2024-03-08 DIAGNOSIS — H04123 Dry eye syndrome of bilateral lacrimal glands: Secondary | ICD-10-CM | POA: Diagnosis not present

## 2024-03-08 DIAGNOSIS — H2513 Age-related nuclear cataract, bilateral: Secondary | ICD-10-CM | POA: Diagnosis not present

## 2024-03-08 DIAGNOSIS — Z79899 Other long term (current) drug therapy: Secondary | ICD-10-CM | POA: Diagnosis not present

## 2024-03-09 DIAGNOSIS — Z992 Dependence on renal dialysis: Secondary | ICD-10-CM | POA: Diagnosis not present

## 2024-03-09 DIAGNOSIS — D689 Coagulation defect, unspecified: Secondary | ICD-10-CM | POA: Diagnosis not present

## 2024-03-09 DIAGNOSIS — E039 Hypothyroidism, unspecified: Secondary | ICD-10-CM | POA: Diagnosis not present

## 2024-03-09 DIAGNOSIS — D631 Anemia in chronic kidney disease: Secondary | ICD-10-CM | POA: Diagnosis not present

## 2024-03-09 DIAGNOSIS — N2581 Secondary hyperparathyroidism of renal origin: Secondary | ICD-10-CM | POA: Diagnosis not present

## 2024-03-09 DIAGNOSIS — N186 End stage renal disease: Secondary | ICD-10-CM | POA: Diagnosis not present

## 2024-03-10 DIAGNOSIS — Z992 Dependence on renal dialysis: Secondary | ICD-10-CM | POA: Diagnosis not present

## 2024-03-10 DIAGNOSIS — D689 Coagulation defect, unspecified: Secondary | ICD-10-CM | POA: Diagnosis not present

## 2024-03-10 DIAGNOSIS — N2581 Secondary hyperparathyroidism of renal origin: Secondary | ICD-10-CM | POA: Diagnosis not present

## 2024-03-10 DIAGNOSIS — N186 End stage renal disease: Secondary | ICD-10-CM | POA: Diagnosis not present

## 2024-03-10 DIAGNOSIS — D631 Anemia in chronic kidney disease: Secondary | ICD-10-CM | POA: Diagnosis not present

## 2024-03-10 DIAGNOSIS — E039 Hypothyroidism, unspecified: Secondary | ICD-10-CM | POA: Diagnosis not present

## 2024-03-11 DIAGNOSIS — N2581 Secondary hyperparathyroidism of renal origin: Secondary | ICD-10-CM | POA: Diagnosis not present

## 2024-03-11 DIAGNOSIS — N186 End stage renal disease: Secondary | ICD-10-CM | POA: Diagnosis not present

## 2024-03-11 DIAGNOSIS — E039 Hypothyroidism, unspecified: Secondary | ICD-10-CM | POA: Diagnosis not present

## 2024-03-11 DIAGNOSIS — R1084 Generalized abdominal pain: Secondary | ICD-10-CM | POA: Diagnosis not present

## 2024-03-11 DIAGNOSIS — D631 Anemia in chronic kidney disease: Secondary | ICD-10-CM | POA: Diagnosis not present

## 2024-03-11 DIAGNOSIS — D689 Coagulation defect, unspecified: Secondary | ICD-10-CM | POA: Diagnosis not present

## 2024-03-11 DIAGNOSIS — Z992 Dependence on renal dialysis: Secondary | ICD-10-CM | POA: Diagnosis not present

## 2024-03-13 DIAGNOSIS — D631 Anemia in chronic kidney disease: Secondary | ICD-10-CM | POA: Diagnosis not present

## 2024-03-13 DIAGNOSIS — Z992 Dependence on renal dialysis: Secondary | ICD-10-CM | POA: Diagnosis not present

## 2024-03-13 DIAGNOSIS — E039 Hypothyroidism, unspecified: Secondary | ICD-10-CM | POA: Diagnosis not present

## 2024-03-13 DIAGNOSIS — D689 Coagulation defect, unspecified: Secondary | ICD-10-CM | POA: Diagnosis not present

## 2024-03-13 DIAGNOSIS — N186 End stage renal disease: Secondary | ICD-10-CM | POA: Diagnosis not present

## 2024-03-13 DIAGNOSIS — N2581 Secondary hyperparathyroidism of renal origin: Secondary | ICD-10-CM | POA: Diagnosis not present

## 2024-03-16 DIAGNOSIS — N2581 Secondary hyperparathyroidism of renal origin: Secondary | ICD-10-CM | POA: Diagnosis not present

## 2024-03-16 DIAGNOSIS — E039 Hypothyroidism, unspecified: Secondary | ICD-10-CM | POA: Diagnosis not present

## 2024-03-16 DIAGNOSIS — N186 End stage renal disease: Secondary | ICD-10-CM | POA: Diagnosis not present

## 2024-03-16 DIAGNOSIS — D631 Anemia in chronic kidney disease: Secondary | ICD-10-CM | POA: Diagnosis not present

## 2024-03-16 DIAGNOSIS — Z992 Dependence on renal dialysis: Secondary | ICD-10-CM | POA: Diagnosis not present

## 2024-03-16 DIAGNOSIS — D689 Coagulation defect, unspecified: Secondary | ICD-10-CM | POA: Diagnosis not present

## 2024-03-18 DIAGNOSIS — N2581 Secondary hyperparathyroidism of renal origin: Secondary | ICD-10-CM | POA: Diagnosis not present

## 2024-03-18 DIAGNOSIS — D689 Coagulation defect, unspecified: Secondary | ICD-10-CM | POA: Diagnosis not present

## 2024-03-18 DIAGNOSIS — Z992 Dependence on renal dialysis: Secondary | ICD-10-CM | POA: Diagnosis not present

## 2024-03-18 DIAGNOSIS — E039 Hypothyroidism, unspecified: Secondary | ICD-10-CM | POA: Diagnosis not present

## 2024-03-18 DIAGNOSIS — D631 Anemia in chronic kidney disease: Secondary | ICD-10-CM | POA: Diagnosis not present

## 2024-03-18 DIAGNOSIS — N186 End stage renal disease: Secondary | ICD-10-CM | POA: Diagnosis not present

## 2024-03-20 DIAGNOSIS — D631 Anemia in chronic kidney disease: Secondary | ICD-10-CM | POA: Diagnosis not present

## 2024-03-20 DIAGNOSIS — D689 Coagulation defect, unspecified: Secondary | ICD-10-CM | POA: Diagnosis not present

## 2024-03-20 DIAGNOSIS — N186 End stage renal disease: Secondary | ICD-10-CM | POA: Diagnosis not present

## 2024-03-20 DIAGNOSIS — E039 Hypothyroidism, unspecified: Secondary | ICD-10-CM | POA: Diagnosis not present

## 2024-03-20 DIAGNOSIS — Z992 Dependence on renal dialysis: Secondary | ICD-10-CM | POA: Diagnosis not present

## 2024-03-20 DIAGNOSIS — N2581 Secondary hyperparathyroidism of renal origin: Secondary | ICD-10-CM | POA: Diagnosis not present

## 2024-03-22 DIAGNOSIS — Z992 Dependence on renal dialysis: Secondary | ICD-10-CM | POA: Diagnosis not present

## 2024-03-22 DIAGNOSIS — T8612 Kidney transplant failure: Secondary | ICD-10-CM | POA: Diagnosis not present

## 2024-03-22 DIAGNOSIS — N186 End stage renal disease: Secondary | ICD-10-CM | POA: Diagnosis not present

## 2024-03-23 DIAGNOSIS — D631 Anemia in chronic kidney disease: Secondary | ICD-10-CM | POA: Diagnosis not present

## 2024-03-23 DIAGNOSIS — E039 Hypothyroidism, unspecified: Secondary | ICD-10-CM | POA: Diagnosis not present

## 2024-03-23 DIAGNOSIS — N186 End stage renal disease: Secondary | ICD-10-CM | POA: Diagnosis not present

## 2024-03-23 DIAGNOSIS — D689 Coagulation defect, unspecified: Secondary | ICD-10-CM | POA: Diagnosis not present

## 2024-03-23 DIAGNOSIS — Z992 Dependence on renal dialysis: Secondary | ICD-10-CM | POA: Diagnosis not present

## 2024-03-23 DIAGNOSIS — E1129 Type 2 diabetes mellitus with other diabetic kidney complication: Secondary | ICD-10-CM | POA: Diagnosis not present

## 2024-03-23 DIAGNOSIS — N2581 Secondary hyperparathyroidism of renal origin: Secondary | ICD-10-CM | POA: Diagnosis not present

## 2024-03-24 ENCOUNTER — Other Ambulatory Visit: Payer: Self-pay

## 2024-03-24 ENCOUNTER — Encounter (HOSPITAL_COMMUNITY)
Admission: RE | Disposition: A | Discharge status: Home / Self Care | Payer: Self-pay | Source: Home / Self Care | Attending: Vascular Surgery

## 2024-03-24 ENCOUNTER — Ambulatory Visit (HOSPITAL_COMMUNITY)
Admission: RE | Admit: 2024-03-24 | Discharge: 2024-03-24 | Disposition: A | Discharge status: Home / Self Care | Attending: Vascular Surgery | Admitting: Vascular Surgery

## 2024-03-24 DIAGNOSIS — Y832 Surgical operation with anastomosis, bypass or graft as the cause of abnormal reaction of the patient, or of later complication, without mention of misadventure at the time of the procedure: Secondary | ICD-10-CM | POA: Insufficient documentation

## 2024-03-24 DIAGNOSIS — Z992 Dependence on renal dialysis: Secondary | ICD-10-CM | POA: Diagnosis not present

## 2024-03-24 DIAGNOSIS — N186 End stage renal disease: Secondary | ICD-10-CM | POA: Diagnosis not present

## 2024-03-24 DIAGNOSIS — I120 Hypertensive chronic kidney disease with stage 5 chronic kidney disease or end stage renal disease: Secondary | ICD-10-CM | POA: Diagnosis not present

## 2024-03-24 DIAGNOSIS — T82898A Other specified complication of vascular prosthetic devices, implants and grafts, initial encounter: Secondary | ICD-10-CM | POA: Insufficient documentation

## 2024-03-24 HISTORY — PX: A/V SHUNT INTERVENTION: CATH118220

## 2024-03-24 SURGERY — A/V SHUNT INTERVENTION
Anesthesia: LOCAL | Site: Arm Upper | Laterality: Right

## 2024-03-24 MED ORDER — LIDOCAINE HCL (PF) 1 % IJ SOLN
INTRAMUSCULAR | Status: AC
  Filled 2024-03-24: qty 30

## 2024-03-24 MED ORDER — IODIXANOL 320 MG/ML IV SOLN
INTRAVENOUS | Status: DC | PRN
Start: 2024-03-24 — End: 2024-03-24
  Administered 2024-03-24: 15 mL

## 2024-03-24 MED ORDER — HEPARIN (PORCINE) IN NACL 1000-0.9 UT/500ML-% IV SOLN
INTRAVENOUS | Status: DC | PRN
  Administered 2024-03-24: 500 mL

## 2024-03-24 MED ORDER — LIDOCAINE HCL (PF) 1 % IJ SOLN
INTRAMUSCULAR | Status: DC | PRN
  Administered 2024-03-24: 2 mL

## 2024-03-24 MED ORDER — IOHEXOL 350 MG/ML SOLN: INTRAVENOUS | Status: DC | PRN

## 2024-03-24 SURGICAL SUPPLY — 4 items
KIT MICROPUNCTURE NIT STIFF (SHEATH) IMPLANT
KIT PV (KITS) ×1 IMPLANT
TRAY PV CATH (CUSTOM PROCEDURE TRAY) ×1 IMPLANT
TUBING CIL FLEX 10 FLL-RA (TUBING) IMPLANT

## 2024-03-24 NOTE — H&P (Signed)
 HD ACCESS CENTER H&P   Patient ID: Kimberly [name], female   DOB: 03-Oct-1955, 68 y.o.   MRN: [id number]  Subjective:     HPI Kimberly [name] is a 68 y.o. female with ESRD presenting to the HD access center for intervention.  Past Medical History:  Diagnosis Date   Anemia    Arthritis    Asthma    Complication of anesthesia    Slow to awaken and AMS- took 1 month to memory and recall to return   COVID    x2   Diverticulosis    First degree AV block    GERD (gastroesophageal reflux disease)    History of blood transfusion    History of chronic cough    History of hiatal hernia    History of hoarseness    Hypertension    history; resolved on dialysis   Hypothyroidism    IgA nephropathy    ESRD - Hemo TTH Sat   Premature atrial beat    Premature ventricular beat    Renal transplant failure and rejection 2012   Shingles 03/2011   Sleep apnea    uses a mouth guard   Stroke [facility]) October 2010, March 2013   cerebral aneurysm, 07/2017   Family History  Problem Relation Age of Onset   Coronary artery disease Mother    Emphysema Mother        smoked   Ovarian cancer Mother    Liver disease Father    Stroke Maternal Uncle    Diabetes Maternal Uncle    Kidney disease Maternal Uncle    Past Surgical History:  Procedure Laterality Date   A/V FISTULAGRAM N/A 11/17/2023   Procedure: A/V Fistulagram;  Surgeon: Melia Lynwood ORN, MD;  Location: [facility] INVASIVE CV LAB;  Service: Cardiovascular;  Laterality: N/A;   AV FISTULA PLACEMENT Left 05/20/2018   Procedure: INSERTION OF ARTERIOVENOUS (AV) GORE-TEX GRAFT LEFT  ARM;  Surgeon: Sheree Penne Bruckner, MD;  Location: [facility] OR;  Service: Vascular;  Laterality: Left;   AV FISTULA PLACEMENT Right 10/26/2021   Procedure: INSERTION OF RIGHT ARM ARTERIOVENOUS (AV) GORE-TEX GRAFT;  Surgeon: Sheree Penne Bruckner, MD;  Location: [facility] OR;  Service: Vascular;  Laterality: Right;   AVGG REMOVAL Left 03/28/2023   Procedure: LEFT ARM ARTERIOVENOUS  GORETEX GRAFT EXCISION;  Surgeon: Sheree Penne Bruckner, MD;  Location: [facility] OR;  Service: Vascular;  Laterality: Left;   CAPD INSERTION  08/25/2008   open, Dr Lorriane   CAPD INSERTION N/A 12/14/2013   Procedure: LAPAROSCOPIC INSERTION CONTINUOUS AMBULATORY PERITONEAL DIALYSIS  (CAPD) CATHETER;  Surgeon: Elspeth KYM Schultze, MD;  Location: [facility] OR;  Service: General;  Laterality: N/A;   CAPD REMOVAL  03/22/2010   Dr Lorriane   COLONOSCOPY     ESOPHAGOGASTRODUODENOSCOPY     EYE SURGERY Bilateral    radial keratotomy   Hemodialysis catheter Right 02/13/2018   INSERTION OF MESH N/A 12/14/2013   Procedure: INSERTION OF MESH;  Surgeon: Elspeth KYM Schultze, MD;  Location: [facility] OR;  Service: General;  Laterality: N/A;   KIDNEY TRANSPLANT Right 01/2010   [facility]   LAPAROSCOPIC LYSIS OF ADHESIONS N/A 12/14/2013   Procedure: LAPAROSCOPIC LYSIS OF ADHESIONS;  Surgeon: Elspeth KYM Schultze, MD;  Location: [facility] OR;  Service: General;  Laterality: N/A;   PATCH ANGIOPLASTY  06/29/2021   Procedure: PATCH ANGIOPLASTY;  Surgeon: Magda Debby SAILOR, MD;  Location: [facility] OR;  Service: Vascular;;   PATCH ANGIOPLASTY  03/28/2023   Procedure: VEIN PATCH ANGIOPLASTY;  Surgeon: Sheree Penne Bruckner, MD;  Location: [facility] OR;  Service: Vascular;;   PERIPHERAL VASCULAR BALLOON ANGIOPLASTY  11/17/2023   Procedure: PERIPHERAL VASCULAR BALLOON ANGIOPLASTY;  Surgeon: Melia Lynwood ORN, MD;  Location: [facility] INVASIVE CV LAB;  Service: Cardiovascular;;  50% intragraft and 50 % VA   THROMBECTOMY AND REVISION OF ARTERIOVENTOUS (AV) GORETEX  GRAFT Left 06/29/2021   Procedure: REVISION OF LEFT UPPER EXTREMITY ARTERIOVENOUS GORETEX GRAFT WITH THROMBECTOMY;  Surgeon: Magda Debby SAILOR, MD;  Location: [facility] OR;  Service: Vascular;  Laterality: Left;  PERIPHERAL NERVE BLOCK   TOTAL HIP ARTHROPLASTY Left 02/06/2019   Procedure: TOTAL HIP ARTHROPLASTY ANTERIOR APPROACH;  Surgeon: Fidel Rogue, MD;  Location: [facility] OR;  Service: Orthopedics;  Laterality: Left;   UMBILICAL  HERNIA REPAIR  10/25/2009   open with mesh,  Dr Lorriane   URETER REVISION  04/2011   [facility]   VENTRAL HERNIA REPAIR N/A 12/14/2013   Procedure: LAPAROSCOPIC VENTRAL HERNIA;  Surgeon: Elspeth KYM Schultze, MD;  Location: [facility] OR;  Service: General;  Laterality: N/A;   VIDEO ASSISTED THORACOSCOPY (VATS)/WEDGE RESECTION Left 09/01/2014   Procedure: VIDEO ASSISTED THORACOSCOPY (VATS)/WEDGE RESECTION;  Surgeon: Elspeth JAYSON Millers, MD;  Location: [facility] OR;  Service: Thoracic;  Laterality: Left;   VIDEO BRONCHOSCOPY Bilateral 07/05/2014   Procedure: VIDEO BRONCHOSCOPY WITH FLUORO;  Surgeon: Ozell KATHEE America, MD;  Location: [facility] ENDOSCOPY;  Service: Cardiopulmonary;  Laterality: Bilateral;    Short Social History:  Social History   Tobacco Use   Smoking status: Never    Passive exposure: Never   Smokeless tobacco: Never  Substance Use Topics   Alcohol use: Yes    Comment: ocassional- not weekly    Allergies  Allergen Reactions   Mircera [Methoxy Polyethylene Glycol-Epoetin  Beta] Other (See Comments)    Severe joint and muscle pain   Mycophenolate  Mofetil Diarrhea   Dapsone Rash and Other (See Comments)    Pain in feet   Pregabalin Rash and Other (See Comments)    Fever - reaction to Lyrica   Sulfonamide Derivatives Rash   Tramadol  Rash and Other (See Comments)    Rash on cheek    No current facility-administered medications for this encounter.    REVIEW OF SYSTEMS All other systems were reviewed and are negative     Objective:   Objective   Vitals:   03/24/24 0848 03/24/24 0856  BP: (!) (P) 159/71 (!) 159/71  Pulse: (!) (P) 59 (!) 56  Resp: (P) 12 14  Temp: (P) 98.3 F (36.8 C)   TempSrc: (P) Oral   SpO2: (P) 96% 97%   There is no height or weight on file to calculate BMI.  Physical Exam General: no acute distress Cardiac: hemodynamically stable Extremities: Excellent thrill in left arm AVG  Data: Reviewed fistulogram from Dr. Melia performed in February.  A venous limb  stenosis was treated with an 8 mm Mustang balloon.     Assessment/Plan:   Kimberly [name] is a 68 y.o. female with ESRD presenting for fistulogram.  Decreased bruit has been noticed.  Last HD session yesterday. Reviewed risks and benefits of fistulogram with possible intervention and patient agreed to proceed.   Norman Serve, MD [facility]

## 2024-03-24 NOTE — Op Note (Signed)
    Patient name: Christy Ehrsam Maxton MRN: [id number] DOB: 03/23/1956 Sex: female  03/24/2024 Pre-operative Diagnosis: ESRD on HD Post-operative diagnosis:  Same Surgeon:  Norman GORMAN Serve, MD Procedure Performed:  Ultrasound-guided access of right arm AV graft Fistulogram and central venogram   Indications: Ms. Prindle is a 68 year old female with ESRD who presented HD access center for intervention.  She has been having decreased bruit.  Nephrologist was concerned and therefore ordered a fistulogram.  Her last HD session was yesterday morning without any issue.  Reviewed the recent benefits of fistulogram with possible intervention and she agreed to proceed.  Findings:  Widely patent central venous system.  Widely patent outflow axillary vein.  Widely patent AV graft.  Widely patent arterial anastomosis.   Procedure:  The patient was identified in the holding area and taken to the cath lab  The patient was then placed supine on the table and prepped and draped in the usual sterile fashion.  A time out was called.  Ultrasound was used to evaluate the right arm AV access. This was accessed under u/s guidance. An 018 wire was advanced without resistance, a micropuncture sheath was placed and fistulagram obtained which demonstrated the above findings.  At this point given that there was no flow-limiting stenosis the procedure was concluded.  The micropuncture sheath was removed and a 4-0 Monocryl figure-of-eight suture was placed for hemostasis.  Contrast: 15cc    Impression: Widely patent AV graft   Norman GORMAN Serve MD [facility] [place] Office: [phone]

## 2024-03-25 ENCOUNTER — Telehealth: Payer: Self-pay

## 2024-03-25 ENCOUNTER — Encounter (HOSPITAL_COMMUNITY): Payer: Self-pay | Admitting: Vascular Surgery

## 2024-03-25 DIAGNOSIS — E039 Hypothyroidism, unspecified: Secondary | ICD-10-CM | POA: Diagnosis not present

## 2024-03-25 DIAGNOSIS — N2581 Secondary hyperparathyroidism of renal origin: Secondary | ICD-10-CM | POA: Diagnosis not present

## 2024-03-25 DIAGNOSIS — I10 Essential (primary) hypertension: Secondary | ICD-10-CM

## 2024-03-25 DIAGNOSIS — D631 Anemia in chronic kidney disease: Secondary | ICD-10-CM | POA: Diagnosis not present

## 2024-03-25 DIAGNOSIS — Z992 Dependence on renal dialysis: Secondary | ICD-10-CM | POA: Diagnosis not present

## 2024-03-25 DIAGNOSIS — E1129 Type 2 diabetes mellitus with other diabetic kidney complication: Secondary | ICD-10-CM | POA: Diagnosis not present

## 2024-03-25 DIAGNOSIS — N186 End stage renal disease: Secondary | ICD-10-CM | POA: Diagnosis not present

## 2024-03-25 DIAGNOSIS — D689 Coagulation defect, unspecified: Secondary | ICD-10-CM | POA: Diagnosis not present

## 2024-03-27 DIAGNOSIS — E1129 Type 2 diabetes mellitus with other diabetic kidney complication: Secondary | ICD-10-CM | POA: Diagnosis not present

## 2024-03-27 DIAGNOSIS — D689 Coagulation defect, unspecified: Secondary | ICD-10-CM | POA: Diagnosis not present

## 2024-03-27 DIAGNOSIS — E039 Hypothyroidism, unspecified: Secondary | ICD-10-CM | POA: Diagnosis not present

## 2024-03-27 DIAGNOSIS — D631 Anemia in chronic kidney disease: Secondary | ICD-10-CM | POA: Diagnosis not present

## 2024-03-27 DIAGNOSIS — Z992 Dependence on renal dialysis: Secondary | ICD-10-CM | POA: Diagnosis not present

## 2024-03-27 DIAGNOSIS — N186 End stage renal disease: Secondary | ICD-10-CM | POA: Diagnosis not present

## 2024-03-27 DIAGNOSIS — N2581 Secondary hyperparathyroidism of renal origin: Secondary | ICD-10-CM | POA: Diagnosis not present

## 2024-03-30 DIAGNOSIS — Z992 Dependence on renal dialysis: Secondary | ICD-10-CM | POA: Diagnosis not present

## 2024-03-30 DIAGNOSIS — E039 Hypothyroidism, unspecified: Secondary | ICD-10-CM | POA: Diagnosis not present

## 2024-03-30 DIAGNOSIS — N186 End stage renal disease: Secondary | ICD-10-CM | POA: Diagnosis not present

## 2024-03-30 DIAGNOSIS — E1129 Type 2 diabetes mellitus with other diabetic kidney complication: Secondary | ICD-10-CM | POA: Diagnosis not present

## 2024-03-30 DIAGNOSIS — D631 Anemia in chronic kidney disease: Secondary | ICD-10-CM | POA: Diagnosis not present

## 2024-03-30 DIAGNOSIS — N2581 Secondary hyperparathyroidism of renal origin: Secondary | ICD-10-CM | POA: Diagnosis not present

## 2024-03-30 DIAGNOSIS — D689 Coagulation defect, unspecified: Secondary | ICD-10-CM | POA: Diagnosis not present

## 2024-04-01 DIAGNOSIS — E039 Hypothyroidism, unspecified: Secondary | ICD-10-CM | POA: Diagnosis not present

## 2024-04-01 DIAGNOSIS — D631 Anemia in chronic kidney disease: Secondary | ICD-10-CM | POA: Diagnosis not present

## 2024-04-01 DIAGNOSIS — N2581 Secondary hyperparathyroidism of renal origin: Secondary | ICD-10-CM | POA: Diagnosis not present

## 2024-04-01 DIAGNOSIS — E1129 Type 2 diabetes mellitus with other diabetic kidney complication: Secondary | ICD-10-CM | POA: Diagnosis not present

## 2024-04-01 DIAGNOSIS — D689 Coagulation defect, unspecified: Secondary | ICD-10-CM | POA: Diagnosis not present

## 2024-04-01 DIAGNOSIS — N186 End stage renal disease: Secondary | ICD-10-CM | POA: Diagnosis not present

## 2024-04-01 DIAGNOSIS — Z992 Dependence on renal dialysis: Secondary | ICD-10-CM | POA: Diagnosis not present

## 2024-04-02 DIAGNOSIS — Z1231 Encounter for screening mammogram for malignant neoplasm of breast: Secondary | ICD-10-CM | POA: Diagnosis not present

## 2024-04-03 DIAGNOSIS — E039 Hypothyroidism, unspecified: Secondary | ICD-10-CM | POA: Diagnosis not present

## 2024-04-03 DIAGNOSIS — Z992 Dependence on renal dialysis: Secondary | ICD-10-CM | POA: Diagnosis not present

## 2024-04-03 DIAGNOSIS — N186 End stage renal disease: Secondary | ICD-10-CM | POA: Diagnosis not present

## 2024-04-03 DIAGNOSIS — E1129 Type 2 diabetes mellitus with other diabetic kidney complication: Secondary | ICD-10-CM | POA: Diagnosis not present

## 2024-04-03 DIAGNOSIS — D689 Coagulation defect, unspecified: Secondary | ICD-10-CM | POA: Diagnosis not present

## 2024-04-03 DIAGNOSIS — N2581 Secondary hyperparathyroidism of renal origin: Secondary | ICD-10-CM | POA: Diagnosis not present

## 2024-04-03 DIAGNOSIS — D631 Anemia in chronic kidney disease: Secondary | ICD-10-CM | POA: Diagnosis not present

## 2024-04-06 DIAGNOSIS — N186 End stage renal disease: Secondary | ICD-10-CM | POA: Diagnosis not present

## 2024-04-06 DIAGNOSIS — E1129 Type 2 diabetes mellitus with other diabetic kidney complication: Secondary | ICD-10-CM | POA: Diagnosis not present

## 2024-04-06 DIAGNOSIS — D689 Coagulation defect, unspecified: Secondary | ICD-10-CM | POA: Diagnosis not present

## 2024-04-06 DIAGNOSIS — E039 Hypothyroidism, unspecified: Secondary | ICD-10-CM | POA: Diagnosis not present

## 2024-04-06 DIAGNOSIS — Z992 Dependence on renal dialysis: Secondary | ICD-10-CM | POA: Diagnosis not present

## 2024-04-06 DIAGNOSIS — N2581 Secondary hyperparathyroidism of renal origin: Secondary | ICD-10-CM | POA: Diagnosis not present

## 2024-04-06 DIAGNOSIS — D631 Anemia in chronic kidney disease: Secondary | ICD-10-CM | POA: Diagnosis not present

## 2024-04-10 DIAGNOSIS — D689 Coagulation defect, unspecified: Secondary | ICD-10-CM | POA: Diagnosis not present

## 2024-04-10 DIAGNOSIS — E1129 Type 2 diabetes mellitus with other diabetic kidney complication: Secondary | ICD-10-CM | POA: Diagnosis not present

## 2024-04-10 DIAGNOSIS — D631 Anemia in chronic kidney disease: Secondary | ICD-10-CM | POA: Diagnosis not present

## 2024-04-10 DIAGNOSIS — N2581 Secondary hyperparathyroidism of renal origin: Secondary | ICD-10-CM | POA: Diagnosis not present

## 2024-04-10 DIAGNOSIS — Z992 Dependence on renal dialysis: Secondary | ICD-10-CM | POA: Diagnosis not present

## 2024-04-10 DIAGNOSIS — E039 Hypothyroidism, unspecified: Secondary | ICD-10-CM | POA: Diagnosis not present

## 2024-04-10 DIAGNOSIS — N186 End stage renal disease: Secondary | ICD-10-CM | POA: Diagnosis not present

## 2024-04-11 ENCOUNTER — Other Ambulatory Visit: Payer: Self-pay | Admitting: Internal Medicine

## 2024-04-13 DIAGNOSIS — E039 Hypothyroidism, unspecified: Secondary | ICD-10-CM | POA: Diagnosis not present

## 2024-04-13 DIAGNOSIS — D689 Coagulation defect, unspecified: Secondary | ICD-10-CM | POA: Diagnosis not present

## 2024-04-13 DIAGNOSIS — N186 End stage renal disease: Secondary | ICD-10-CM | POA: Diagnosis not present

## 2024-04-13 DIAGNOSIS — Z992 Dependence on renal dialysis: Secondary | ICD-10-CM | POA: Diagnosis not present

## 2024-04-13 DIAGNOSIS — D631 Anemia in chronic kidney disease: Secondary | ICD-10-CM | POA: Diagnosis not present

## 2024-04-13 DIAGNOSIS — N2581 Secondary hyperparathyroidism of renal origin: Secondary | ICD-10-CM | POA: Diagnosis not present

## 2024-04-13 DIAGNOSIS — E1129 Type 2 diabetes mellitus with other diabetic kidney complication: Secondary | ICD-10-CM | POA: Diagnosis not present

## 2024-04-15 DIAGNOSIS — N2581 Secondary hyperparathyroidism of renal origin: Secondary | ICD-10-CM | POA: Diagnosis not present

## 2024-04-15 DIAGNOSIS — E039 Hypothyroidism, unspecified: Secondary | ICD-10-CM | POA: Diagnosis not present

## 2024-04-15 DIAGNOSIS — Z992 Dependence on renal dialysis: Secondary | ICD-10-CM | POA: Diagnosis not present

## 2024-04-15 DIAGNOSIS — D689 Coagulation defect, unspecified: Secondary | ICD-10-CM | POA: Diagnosis not present

## 2024-04-15 DIAGNOSIS — E1129 Type 2 diabetes mellitus with other diabetic kidney complication: Secondary | ICD-10-CM | POA: Diagnosis not present

## 2024-04-15 DIAGNOSIS — D631 Anemia in chronic kidney disease: Secondary | ICD-10-CM | POA: Diagnosis not present

## 2024-04-15 DIAGNOSIS — N186 End stage renal disease: Secondary | ICD-10-CM | POA: Diagnosis not present

## 2024-04-17 DIAGNOSIS — Z992 Dependence on renal dialysis: Secondary | ICD-10-CM | POA: Diagnosis not present

## 2024-04-17 DIAGNOSIS — N186 End stage renal disease: Secondary | ICD-10-CM | POA: Diagnosis not present

## 2024-04-17 DIAGNOSIS — E1129 Type 2 diabetes mellitus with other diabetic kidney complication: Secondary | ICD-10-CM | POA: Diagnosis not present

## 2024-04-17 DIAGNOSIS — N2581 Secondary hyperparathyroidism of renal origin: Secondary | ICD-10-CM | POA: Diagnosis not present

## 2024-04-17 DIAGNOSIS — D631 Anemia in chronic kidney disease: Secondary | ICD-10-CM | POA: Diagnosis not present

## 2024-04-17 DIAGNOSIS — E039 Hypothyroidism, unspecified: Secondary | ICD-10-CM | POA: Diagnosis not present

## 2024-04-17 DIAGNOSIS — D689 Coagulation defect, unspecified: Secondary | ICD-10-CM | POA: Diagnosis not present

## 2024-04-19 ENCOUNTER — Telehealth: Payer: Self-pay | Admitting: *Deleted

## 2024-04-19 NOTE — Progress Notes (Unsigned)
 Complex Care Management Note Care Guide Note  04/19/2024 Name: Kimberly [name] MRN: [id number] DOB: 12-Feb-1956   Complex Care Management Outreach Attempts: An unsuccessful telephone outreach was attempted today to offer the patient information about available complex care management services.  Follow Up Plan:  Additional outreach attempts will be made to offer the patient complex care management information and services.   Encounter Outcome:  No Answer  Harlene Satterfield  [facility]  [facility], [facility] Guide  Direct Dial : [phone]  Fax [phone]

## 2024-04-20 DIAGNOSIS — D631 Anemia in chronic kidney disease: Secondary | ICD-10-CM | POA: Diagnosis not present

## 2024-04-20 DIAGNOSIS — N2581 Secondary hyperparathyroidism of renal origin: Secondary | ICD-10-CM | POA: Diagnosis not present

## 2024-04-20 DIAGNOSIS — D689 Coagulation defect, unspecified: Secondary | ICD-10-CM | POA: Diagnosis not present

## 2024-04-20 DIAGNOSIS — Z992 Dependence on renal dialysis: Secondary | ICD-10-CM | POA: Diagnosis not present

## 2024-04-20 DIAGNOSIS — N186 End stage renal disease: Secondary | ICD-10-CM | POA: Diagnosis not present

## 2024-04-20 DIAGNOSIS — E1129 Type 2 diabetes mellitus with other diabetic kidney complication: Secondary | ICD-10-CM | POA: Diagnosis not present

## 2024-04-20 DIAGNOSIS — E039 Hypothyroidism, unspecified: Secondary | ICD-10-CM | POA: Diagnosis not present

## 2024-04-20 NOTE — Progress Notes (Unsigned)
 Complex Care Management Note Care Guide Note  04/20/2024 Name: Kimberly [name] MRN: [id number] DOB: [birth year]   Complex Care Management Outreach Attempts: A second unsuccessful outreach was attempted today to offer the patient with information about available complex care management services.  Follow Up Plan:  Additional outreach attempts will be made to offer the patient complex care management information and services.   Encounter Outcome:  No Answer  Harlene Satterfield  [facility]  [facility], [facility] Guide  Direct Dial : [phone]  Fax [phone]

## 2024-04-21 NOTE — Progress Notes (Signed)
 Complex Care Management Note  Care Guide Note 04/21/2024 Name: Giannamarie Paulus Micco MRN: [id number] DOB: Oct 02, 1955  Omeka Holben Roop is a 68 y.o. year old female who sees Claudene Pellet, MD for primary care. I reached out to [employer] by phone today to offer complex care management services.  Ms. Dittmer was given information about Complex Care Management services today including:   The Complex Care Management services include support from the care team which includes your Nurse Care Manager, Clinical Social Worker, or Pharmacist.  The Complex Care Management team is here to help remove barriers to the health concerns and goals most important to you. Complex Care Management services are voluntary, and the patient may decline or stop services at any time by request to their care team member.   Complex Care Management Consent Status: Patient did not agree to participate in complex care management services at this time.  Follow up plan: None   Encounter Outcome:  Patient Refused  Harlene Satterfield  [facility]  [facility], [facility] Guide  Direct Dial : [phone]  Fax [phone]

## 2024-04-22 DIAGNOSIS — J45909 Unspecified asthma, uncomplicated: Secondary | ICD-10-CM | POA: Diagnosis not present

## 2024-04-22 DIAGNOSIS — E785 Hyperlipidemia, unspecified: Secondary | ICD-10-CM | POA: Diagnosis not present

## 2024-04-22 DIAGNOSIS — D631 Anemia in chronic kidney disease: Secondary | ICD-10-CM | POA: Diagnosis not present

## 2024-04-22 DIAGNOSIS — F32A Depression, unspecified: Secondary | ICD-10-CM | POA: Diagnosis not present

## 2024-04-22 DIAGNOSIS — N2581 Secondary hyperparathyroidism of renal origin: Secondary | ICD-10-CM | POA: Diagnosis not present

## 2024-04-22 DIAGNOSIS — R634 Abnormal weight loss: Secondary | ICD-10-CM | POA: Diagnosis not present

## 2024-04-22 DIAGNOSIS — D689 Coagulation defect, unspecified: Secondary | ICD-10-CM | POA: Diagnosis not present

## 2024-04-22 DIAGNOSIS — M810 Age-related osteoporosis without current pathological fracture: Secondary | ICD-10-CM | POA: Diagnosis not present

## 2024-04-22 DIAGNOSIS — E039 Hypothyroidism, unspecified: Secondary | ICD-10-CM | POA: Diagnosis not present

## 2024-04-22 DIAGNOSIS — N186 End stage renal disease: Secondary | ICD-10-CM | POA: Diagnosis not present

## 2024-04-22 DIAGNOSIS — Z992 Dependence on renal dialysis: Secondary | ICD-10-CM | POA: Diagnosis not present

## 2024-04-22 DIAGNOSIS — E1129 Type 2 diabetes mellitus with other diabetic kidney complication: Secondary | ICD-10-CM | POA: Diagnosis not present

## 2024-04-22 DIAGNOSIS — R1013 Epigastric pain: Secondary | ICD-10-CM | POA: Diagnosis not present

## 2024-04-26 ENCOUNTER — Other Ambulatory Visit: Payer: Self-pay | Admitting: Family Medicine

## 2024-04-26 DIAGNOSIS — R634 Abnormal weight loss: Secondary | ICD-10-CM

## 2024-04-26 DIAGNOSIS — R1013 Epigastric pain: Secondary | ICD-10-CM

## 2024-04-28 ENCOUNTER — Encounter: Payer: Self-pay | Admitting: Family Medicine

## 2024-05-05 DIAGNOSIS — R413 Other amnesia: Secondary | ICD-10-CM | POA: Diagnosis not present

## 2024-05-05 DIAGNOSIS — I671 Cerebral aneurysm, nonruptured: Secondary | ICD-10-CM | POA: Diagnosis not present

## 2024-05-05 DIAGNOSIS — R41841 Cognitive communication deficit: Secondary | ICD-10-CM | POA: Diagnosis not present

## 2024-05-05 DIAGNOSIS — I639 Cerebral infarction, unspecified: Secondary | ICD-10-CM | POA: Diagnosis not present

## 2024-05-07 ENCOUNTER — Ambulatory Visit
Admission: RE | Admit: 2024-05-07 | Discharge: 2024-05-07 | Disposition: A | Discharge status: Home / Self Care | Source: Ambulatory Visit | Attending: Family Medicine | Admitting: Family Medicine

## 2024-05-07 DIAGNOSIS — D251 Intramural leiomyoma of uterus: Secondary | ICD-10-CM | POA: Diagnosis not present

## 2024-05-07 DIAGNOSIS — R634 Abnormal weight loss: Secondary | ICD-10-CM

## 2024-05-07 DIAGNOSIS — R1013 Epigastric pain: Secondary | ICD-10-CM

## 2024-05-24 DEATH — deceased
